# Patient Record
Sex: Female | Born: 1965 | Hispanic: Refuse to answer | Marital: Single | Smoking: Former smoker
Health system: Southern US, Community
[De-identification: ages and names within clinical notes are randomized; demographics above are authoritative.]

## PROBLEM LIST (undated history)

## (undated) DIAGNOSIS — I639 Cerebral infarction, unspecified: Secondary | ICD-10-CM

## (undated) DIAGNOSIS — I10 Essential (primary) hypertension: Secondary | ICD-10-CM

## (undated) DIAGNOSIS — D649 Anemia, unspecified: Secondary | ICD-10-CM

## (undated) HISTORY — PX: ABDOMINAL HYSTERECTOMY: SHX81

## (undated) HISTORY — PX: CHOLECYSTECTOMY: SHX55

## (undated) HISTORY — DX: Essential (primary) hypertension: I10

## (undated) SURGERY — Surgical Case
Anesthesia: *Unknown

---

## 2005-03-26 ENCOUNTER — Inpatient Hospital Stay: Payer: Self-pay | Admitting: Internal Medicine

## 2005-03-26 ENCOUNTER — Other Ambulatory Visit: Payer: Self-pay

## 2005-08-07 ENCOUNTER — Encounter: Payer: Self-pay | Admitting: Podiatry

## 2005-09-05 ENCOUNTER — Encounter: Payer: Self-pay | Admitting: Podiatry

## 2008-03-06 ENCOUNTER — Emergency Department: Payer: Self-pay | Admitting: Emergency Medicine

## 2008-11-11 ENCOUNTER — Ambulatory Visit: Payer: Self-pay | Admitting: Family Medicine

## 2009-03-24 ENCOUNTER — Ambulatory Visit: Payer: Self-pay | Admitting: Unknown Physician Specialty

## 2009-03-31 ENCOUNTER — Inpatient Hospital Stay: Payer: Self-pay | Admitting: Specialist

## 2009-08-01 ENCOUNTER — Ambulatory Visit: Payer: Self-pay | Admitting: Family Medicine

## 2010-08-21 ENCOUNTER — Inpatient Hospital Stay: Payer: Self-pay | Admitting: Internal Medicine

## 2010-09-01 ENCOUNTER — Ambulatory Visit: Payer: Self-pay | Admitting: Cardiology

## 2010-09-30 ENCOUNTER — Inpatient Hospital Stay: Payer: Self-pay | Admitting: *Deleted

## 2011-06-26 ENCOUNTER — Emergency Department: Payer: Self-pay | Admitting: Emergency Medicine

## 2011-09-05 ENCOUNTER — Ambulatory Visit: Payer: Self-pay | Admitting: Family Medicine

## 2011-09-18 ENCOUNTER — Ambulatory Visit: Payer: Self-pay | Admitting: Family Medicine

## 2011-10-02 ENCOUNTER — Ambulatory Visit: Payer: Self-pay | Admitting: General Surgery

## 2012-02-15 ENCOUNTER — Emergency Department: Payer: Self-pay | Admitting: Internal Medicine

## 2012-02-25 ENCOUNTER — Ambulatory Visit (INDEPENDENT_AMBULATORY_CARE_PROVIDER_SITE_OTHER): Payer: Managed Care, Other (non HMO) | Admitting: Family Medicine

## 2012-02-25 ENCOUNTER — Encounter: Payer: Self-pay | Admitting: Family Medicine

## 2012-02-25 VITALS — BP 138/100 | HR 68 | Temp 97.9°F | Ht 65.25 in | Wt 252.0 lb

## 2012-02-25 DIAGNOSIS — N898 Other specified noninflammatory disorders of vagina: Secondary | ICD-10-CM

## 2012-02-25 DIAGNOSIS — N899 Noninflammatory disorder of vagina, unspecified: Secondary | ICD-10-CM

## 2012-02-25 DIAGNOSIS — I10 Essential (primary) hypertension: Secondary | ICD-10-CM

## 2012-02-25 DIAGNOSIS — E669 Obesity, unspecified: Secondary | ICD-10-CM

## 2012-02-25 LAB — POCT URINALYSIS DIPSTICK
Bilirubin, UA: NEGATIVE
Glucose, UA: NEGATIVE
Nitrite, UA: NEGATIVE
Spec Grav, UA: 1.01
Urobilinogen, UA: NEGATIVE

## 2012-02-25 MED ORDER — FLUCONAZOLE 150 MG PO TABS: 150.0000 mg | ORAL_TABLET | Freq: Once | ORAL | Status: AC

## 2012-02-25 NOTE — Progress Notes (Signed)
Subjective:    Patient ID: [name], female    DOB: 03-05-1966, 46 y.o.   MRN: [id number]  HPI  1.  HTN- on Benicar HCTZ- tried numerous other BP meds in past, did not control her blood pressure. Has not taken her medication this morning. Car broke down, rushed to get here. No CP, SOB, blurred vision or HA.  2.  Vaginal irritation- past couple of weeks- vulvular itching/irritation.  No discharge or dysuria. Not sexually active.  No fevers or chills.  3.  FH of CAD- mom died of MI in her 8s.   Per pt, lipid panel was normal.  4.  Obesity- has lost 80 pounds with diet and exercise.  Has tried everything- low/no carb, weight watchers, low sugar.  Wants to see a surgeon about bariatric surgery.  Patient Active Problem List  Diagnoses  . HTN (hypertension)  . Obesity  . Vaginal irritation   No past medical history on file. Past Surgical History  Procedure Date  . Abdominal hysterectomy   . Cholecystectomy    History  Substance Use Topics  . Smoking status: Current Everyday Smoker  . Smokeless tobacco: Not on file  . Alcohol Use: Not on file   No family history on file. Allergies  Allergen Reactions  . Penicillins Swelling   Current Outpatient Prescriptions on File Prior to Visit  Medication Sig Dispense Refill  . olmesartan-hydrochlorothiazide (BENICAR HCT) 40-25 MG per tablet Take 1 tablet by mouth daily.       The PMH, PSH, Social History, Family History, Medications, and allergies have been reviewed in [facility], and have been updated if relevant.   Review of Systems     Objective:   Physical Exam BP 138/100  Pulse 68  Temp(Src) 97.9 F (36.6 C) (Oral)  Ht 5' 5.25" (1.657 m)  Wt 252 lb (114.306 kg)  BMI 41.61 kg/m2  General:  Well-developed,well-nourished,in no acute distress; alert,appropriate and cooperative throughout examination Head:  normocephalic and atraumatic.   Eyes:  vision grossly intact, pupils equal, pupils round, and pupils reactive to  light.   Ears:  R ear normal and L ear normal.   Nose:  no external deformity.   Mouth:  good dentition.   Neck:  No deformities, masses, or tenderness noted.  Lungs:  Normal respiratory effort, chest expands symmetrically. Lungs are clear to auscultation, no crackles or wheezes. Heart:  Normal rate and regular rhythm. S1 and S2 normal without gallop, murmur, click, rub or other extra sounds. Abdomen:  Bowel sounds positive,abdomen soft and non-tender without masses, organomegaly or hernias noted. Rectal:  no external abnormalities.   Genitalia:  Pelvic Exam:        External: normal female genitalia without lesions or masses        Scant white discharge in vault Msk:  No deformity or scoliosis noted of thoracic or lumbar spine.   Extremities:  No clubbing, cyanosis, edema, or deformity noted with normal full range of motion of all joints.   Neurologic:  alert & oriented X3 and gait normal.   Skin:  Intact without suspicious lesions or rashes Axillary Nodes:  No palpable lymphadenopathy Psych:  Cognition and judgment appear intact. Alert and cooperative with normal attention span and concentration. No apparent delusions, illusions, hallucinations     Assessment & Plan:   1. HTN (hypertension)  Deteriorated but did not take her medication today.  Per pt, has been well controlled.  Will continue current dose of Benicar-HCTZ.   2. Obesity  Deteriorated.  BMI >40, will refer to surgery. Ambulatory referral to General Surgery  3. Vaginal irritation  New- wet prep pos for yeast. Treat with diflucan 150 mg po x 1. See pt instructions for details. POCT urinalysis dipstick, Urine culture

## 2012-02-25 NOTE — Patient Instructions (Signed)
    Monilial Vaginitis Vaginitis in a soreness, swelling and redness (inflammation) of the vagina and vulva. Monilial vaginitis is not a sexually transmitted infection. CAUSES  Yeast vaginitis is caused by yeast (candida) that is normally found in your vagina. With a yeast infection, the candida has overgrown in number to a point that upsets the chemical balance. SYMPTOMS   White, thick vaginal discharge.   Swelling, itching, redness and irritation of the vagina and possibly the lips of the vagina (vulva).   Burning or painful urination.   Painful intercourse.  DIAGNOSIS  Things that may contribute to monilial vaginitis are:  Postmenopausal and virginal states.   Pregnancy.   Infections.   Being tired, sick or stressed, especially if you had monilial vaginitis in the past.   Diabetes. Good control will help lower the chance.   Birth control pills.   Tight fitting garments.   Using bubble bath, feminine sprays, douches or deodorant tampons.   Taking certain medications that kill germs (antibiotics).   Sporadic recurrence can occur if you become ill.  TREATMENT  Your caregiver will give you medication.  There are several kinds of anti monilial vaginal creams and suppositories specific for monilial vaginitis. For recurrent yeast infections, use a suppository or cream in the vagina 2 times a week, or as directed.   Anti-monilial or steroid cream for the itching or irritation of the vulva may also be used. Get your caregiver's permission.   Painting the vagina with methylene blue solution may help if the monilial cream does not work.   Eating yogurt may help prevent monilial vaginitis.  HOME CARE INSTRUCTIONS   Finish all medication as prescribed.   Do not have sex until treatment is completed or after your caregiver tells you it is okay.   Take warm sitz baths.   Do not douche.   Do not use tampons, especially scented ones.   Wear cotton underwear.   Avoid  tight pants and panty hose.   Tell your sexual partner that you have a yeast infection. They should go to their caregiver if they have symptoms such as mild rash or itching.   Your sexual partner should be treated as well if your infection is difficult to eliminate.   Practice safer sex. Use condoms.   Some vaginal medications cause latex condoms to fail. Vaginal medications that harm condoms are:   Cleocin cream.   Butoconazole (Femstat).   Terconazole (Terazol) vaginal suppository.   Miconazole (Monistat) (may be purchased over the counter).  SEEK MEDICAL CARE IF:   You have a temperature by mouth above 102 F (38.9 C).   The infection is getting worse after 2 days of treatment.   The infection is not getting better after 3 days of treatment.   You develop blisters in or around your vagina.   You develop vaginal bleeding, and it is not your menstrual period.   You have pain when you urinate.   You develop intestinal problems.   You have pain with sexual intercourse.  Document Released: 08/01/2005 Document Revised: 10/11/2011 Document Reviewed: 04/15/2009 [facility] Patient Information 2012 [place], [place].   It was so nice to meet you. Please stop by to see Shirlee Limerick on your way out to set up your referral for surgery- call your insurance company.

## 2012-02-27 ENCOUNTER — Other Ambulatory Visit: Payer: Self-pay | Admitting: *Deleted

## 2012-02-27 LAB — URINE CULTURE: Colony Count: 35000

## 2012-02-27 MED ORDER — FLUCONAZOLE 150 MG PO TABS: 150.0000 mg | ORAL_TABLET | Freq: Once | ORAL | Status: AC

## 2012-02-27 NOTE — Telephone Encounter (Signed)
Medicine sent to [facility] per Dr. Dayton Martes.

## 2012-04-25 ENCOUNTER — Ambulatory Visit: Payer: Self-pay | Admitting: General Surgery

## 2012-04-28 ENCOUNTER — Ambulatory Visit: Payer: Managed Care, Other (non HMO) | Admitting: Family Medicine

## 2012-05-01 ENCOUNTER — Ambulatory Visit (INDEPENDENT_AMBULATORY_CARE_PROVIDER_SITE_OTHER): Payer: Managed Care, Other (non HMO) | Admitting: Family Medicine

## 2012-05-01 ENCOUNTER — Encounter: Payer: Self-pay | Admitting: Family Medicine

## 2012-05-01 VITALS — BP 132/94 | HR 72 | Temp 98.5°F | Wt 262.0 lb

## 2012-05-01 DIAGNOSIS — E669 Obesity, unspecified: Secondary | ICD-10-CM

## 2012-05-01 DIAGNOSIS — R1030 Lower abdominal pain, unspecified: Secondary | ICD-10-CM

## 2012-05-01 DIAGNOSIS — R109 Unspecified abdominal pain: Secondary | ICD-10-CM

## 2012-05-01 DIAGNOSIS — J309 Allergic rhinitis, unspecified: Secondary | ICD-10-CM

## 2012-05-01 LAB — POCT URINALYSIS DIPSTICK
Bilirubin, UA: NEGATIVE
Glucose, UA: NEGATIVE
Leukocytes, UA: NEGATIVE
Nitrite, UA: NEGATIVE

## 2012-05-01 NOTE — Patient Instructions (Addendum)
It was great to see you. The medication we discussed today is called Belvig- call your insurance company to see if they would cover it.  Please stop by to see Shirlee Limerick on your way out to set up your ultrasound.

## 2012-05-01 NOTE — Progress Notes (Signed)
Subjective:    Patient ID: [name], female    DOB: Apr 30, 1966, 46 y.o.   MRN: [id number]  HPI  Pleasant 45 yo female here for:  1.  Lower abdominal pain on and off for 3 weeks. She notices it more when she is walking.  No fevers. No dysuria, no hematuria. No changes in her bowel habits, no blood in her stool. S/p hysterectomy 3 years ago. Pain feels like "cramping when she had a period." Not sexually active. No vaginal discharge.  2.  Sinuses- uses flonase but still gets what she thinks are sinus headaches- pain under her eyes bilaterally, improved on flonase. No fevers or chills. No CP, cough or SOB.   3.  Obesity- has lost 80 pounds with diet and exercise.  Has tried everything- low/no carb, weight watchers, low sugar.   Went to see the bariatric surgeon and not sure that she wants to pursue surgery. Asking about diet pills.  Patient Active Problem List  Diagnosis  . HTN (hypertension)  . Obesity  . Vaginal irritation  . Lower abdominal pain   No past medical history on file. Past Surgical History  Procedure Date  . Abdominal hysterectomy   . Cholecystectomy    History  Substance Use Topics  . Smoking status: Current Everyday Smoker  . Smokeless tobacco: Not on file  . Alcohol Use: Not on file   Family History  Problem Relation Age of Onset  . Heart disease Mother 30    MI   Allergies  Allergen Reactions  . Penicillins Swelling   Current Outpatient Prescriptions on File Prior to Visit  Medication Sig Dispense Refill  . olmesartan-hydrochlorothiazide (BENICAR HCT) 40-25 MG per tablet Take 1 tablet by mouth daily.       The PMH, PSH, Social History, Family History, Medications, and allergies have been reviewed in [facility], and have been updated if relevant.   Review of Systems    See HPI Objective:   Physical Exam BP 132/94  Pulse 72  Temp 98.5 F (36.9 C)  Wt 262 lb (118.842 kg)  General:  Well-developed,well-nourished,in no acute distress;  alert,appropriate and cooperative throughout examination Head:  normocephalic and atraumatic.   Eyes:  vision grossly intact, pupils equal, pupils round, and pupils reactive to light.   Ears:  R ear normal and L ear normal.   Nose:  +erythema, otherwise unremarkable. Mouth:  good dentition.   Neck:  No deformities, masses, or tenderness noted.  Lungs:  Normal respiratory effort, chest expands symmetrically. Lungs are clear to auscultation, no crackles or wheezes. Heart:  Normal rate and regular rhythm. S1 and S2 normal without gallop, murmur, click, rub or other extra sounds. Abdomen:   Mild TTP over lower abdomen/suprapubic area (left >right), bilaterally, no rebound or guarding. Msk:  No deformity or scoliosis noted of thoracic or lumbar spine.   Extremities:  No clubbing, cyanosis, edema, or deformity noted with normal full range of motion of all joints.   Neurologic:  alert & oriented X3 and gait normal.   Skin:  Intact without suspicious lesions or rashes Psych:  Cognition and judgment appear intact. Alert and cooperative with normal attention span and concentration. No apparent delusions, illusions, hallucinations     Assessment & Plan:   1. Lower abdominal pain  New- unclear etiology. No evidence of acute abdomen on exam. UA neg except for trace blood- send for cx. ?ovarian cyst  No changes in bowel habits- diverticulitis seems less likely. Will get transvaginal/pelvic ultrasound  US Transvaginal Non-OB, US Pelvis Complete, POCT urinalysis dipstick, Urine culture  2. Obesity  Deteriorated. Discussed that she is not a candidate for stimulant appetite suppressants such as phentermine due to her HTN. I am willing to consider Belvig. She will call her insurance company to determine cost.   3. Allergic rhinitis No evidence of acute sinusitis. Continue flonase, add claritin or allegra. The patient indicates understanding of these issues and agrees with the plan.

## 2012-05-03 LAB — URINE CULTURE: Organism ID, Bacteria: NO GROWTH

## 2012-07-18 ENCOUNTER — Ambulatory Visit: Payer: Managed Care, Other (non HMO) | Admitting: Family Medicine

## 2012-07-25 ENCOUNTER — Ambulatory Visit: Payer: Managed Care, Other (non HMO) | Admitting: Family Medicine

## 2012-07-25 DIAGNOSIS — Z0289 Encounter for other administrative examinations: Secondary | ICD-10-CM

## 2012-08-20 ENCOUNTER — Encounter: Payer: Self-pay | Admitting: Family Medicine

## 2012-08-20 ENCOUNTER — Telehealth: Payer: Self-pay | Admitting: Internal Medicine

## 2012-08-20 ENCOUNTER — Ambulatory Visit (INDEPENDENT_AMBULATORY_CARE_PROVIDER_SITE_OTHER): Payer: Self-pay | Admitting: Family Medicine

## 2012-08-20 VITALS — BP 148/96 | HR 82 | Temp 98.5°F | Wt 263.2 lb

## 2012-08-20 DIAGNOSIS — J019 Acute sinusitis, unspecified: Secondary | ICD-10-CM

## 2012-08-20 MED ORDER — DOXYCYCLINE HYCLATE 100 MG PO TABS: 100.0000 mg | ORAL_TABLET | Freq: Two times a day (BID) | ORAL | Status: DC

## 2012-08-20 NOTE — Assessment & Plan Note (Signed)
Nontoxic, nasal saline and doxy.  F/u prn.  Supportive measures o/w.  She agrees.  Likely component of ETD (can't clear ET with valsalva) that should resolve.

## 2012-08-20 NOTE — Progress Notes (Signed)
Sx started >1 week ago.  Itchy eyes, B ear pain.  No help with OTC meds but had some palpitations after the cold medicine.  Muscles aching.  No fevers but felt hot.  Some sweats.  Cough is minimal, no sputum.  Some bloody rhinorrhea.  Congested.  She isn't improving.  Mult sick exposures at work.    Meds, vitals, and allergies reviewed.   ROS: See HPI.  Otherwise, noncontributory.  GEN: nad, alert and oriented HEENT: mucous membranes moist, tm w/o erythema, nasal exam with purulent rhinorrhea noted,  OP with cobblestoning, max and frontal sinuses ttp NECK: supple w/o LA CV: rrr.   PULM: ctab, no inc wob EXT: no edema SKIN: no acute rash

## 2012-08-20 NOTE — Telephone Encounter (Signed)
Caller: Laurna/Patient; Patient Name: [name]; PCP: Ruthe Mannan [facility]); Best Callback Phone Number: [phone]  onset about 08-13-12 of head congestion  08-20-12 she said she has a lot of facial pain and headache  No fever.  She said she gets sinus infections and this feels same.  unsure if fever but says she does get hot and cold  All emergent sxs per Upper Respiratory Infection Ruled out except for mild to moderate headache for more than 24 hours unrelieved with non prescription medications  Appt made for today at 1415 with Dr Para March

## 2012-08-20 NOTE — Patient Instructions (Signed)
Take plain claritin (not claritin D) for the itchy eyes.  Use nasal saline a few times a day.  Start taking doxycycline twice a day.  Careful for sunburn on that medicine.  Drink plenty of fluids, take tylenol as needed, and this should gradually improve.  Take care.  Let us know if you have other concerns.

## 2012-09-29 ENCOUNTER — Encounter: Payer: Self-pay | Admitting: Family Medicine

## 2012-09-29 ENCOUNTER — Ambulatory Visit (INDEPENDENT_AMBULATORY_CARE_PROVIDER_SITE_OTHER): Payer: Managed Care, Other (non HMO) | Admitting: Family Medicine

## 2012-09-29 VITALS — BP 160/110 | HR 88 | Temp 98.1°F | Wt 267.0 lb

## 2012-09-29 DIAGNOSIS — E669 Obesity, unspecified: Secondary | ICD-10-CM

## 2012-09-29 DIAGNOSIS — I10 Essential (primary) hypertension: Secondary | ICD-10-CM

## 2012-09-29 MED ORDER — AMLODIPINE-OLMESARTAN 10-40 MG PO TABS: 1.0000 | ORAL_TABLET | Freq: Every day | ORAL | Status: DC

## 2012-09-29 MED ORDER — LORCASERIN HCL 10 MG PO TABS: 1.0000 | ORAL_TABLET | Freq: Two times a day (BID) | ORAL | Status: DC

## 2012-09-29 MED ORDER — HYDROCHLOROTHIAZIDE 25 MG PO TABS: 25.0000 mg | ORAL_TABLET | Freq: Every day | ORAL | Status: DC

## 2012-09-29 NOTE — Patient Instructions (Addendum)
Have a Happy Thanksgiving.  We will call you with your lab results.  STOP taking your BENICAR.  Start taking the HCTZ and olmesartan-amlodipine.  Check your blood pressure at home and call me.  Please come see me in one month.

## 2012-09-29 NOTE — Progress Notes (Signed)
Subjective:    Patient ID: [name], female    DOB: Apr 16, 1966, 46 y.o.   MRN: [id number]  HPI  Very pleasant 46 yo female here for feeling her her BP is high for past month or more. Not checking her BP at home.  Has been oon Benicar HCTZ for years- tried numerous other BP meds in past, did not control her blood pressure. Has taken her medication today.  She has gained 30 pounds in one year.  BP Readings from Last 3 Encounters:  09/29/12 160/110  08/20/12 148/96  05/01/12 132/94    No CP.  She has had some DOE but thinks that is related to deconditioning due to her weight gain.  She continues to inquire about diet pills as she feels she really needs helping getting back into her weight loss routine.  Strong   FH of CAD- mom died of MI in her 66s.    Per pt, last lipid panel normal    Patient Active Problem List  Diagnosis  . HTN (hypertension)  . Obesity  . Vaginal irritation  . Lower abdominal pain  . Allergic rhinitis  . Sinusitis, acute   No past medical history on file. Past Surgical History  Procedure Date  . Abdominal hysterectomy   . Cholecystectomy    History  Substance Use Topics  . Smoking status: Current Every Day Smoker -- 0.3 packs/day    Types: Cigarettes  . Smokeless tobacco: Not on file  . Alcohol Use: Yes     Comment: Very seldom   Family History  Problem Relation Age of Onset  . Heart disease Mother 55    MI   Allergies  Allergen Reactions  . Penicillins Swelling   Current Outpatient Prescriptions on File Prior to Visit  Medication Sig Dispense Refill  . olmesartan-hydrochlorothiazide (BENICAR HCT) 40-25 MG per tablet Take 1 tablet by mouth daily.       The PMH, PSH, Social History, Family History, Medications, and allergies have been reviewed in [facility], and have been updated if relevant.   Review of Systems See HPI    Objective:   Physical Exam BP 160/110  Pulse 88  Temp 98.1 F (36.7 C)  Wt 267 lb (121.11 kg)  General:   Well-developed,well-nourished,in no acute distress; alert,appropriate and cooperative throughout examination Head:  normocephalic and atraumatic.   Eyes:  vision grossly intact, pupils equal, pupils round, and pupils reactive to light.   Ears:  R ear normal and L ear normal.   Nose:  no external deformity.   Mouth:  good dentition.   Neck:  No deformities, masses, or tenderness noted.  Lungs:  Normal respiratory effort, chest expands symmetrically. Lungs are clear to auscultation, no crackles or wheezes. Heart:  Normal rate and regular rhythm. S1 and S2 normal without gallop, murmur, click, rub or other extra sounds. Extremities:  No clubbing, cyanosis, edema, or deformity noted with normal full range of motion of all joints.   Neurologic:  alert & oriented X3 and gait normal.   Skin:  Intact without suspicious lesions or rashes Axillary Nodes:  No palpable lymphadenopathy Psych:  Cognition and judgment appear intact. Alert and cooperative with normal attention span and concentration. No apparent delusions, illusions, hallucinations     Assessment & Plan:    1. HTN (hypertension)  Deteriorated, likely due to weight gain.  EKG reassuring- sinus rhythm with ?LVH- unchanged from 2011.  Will d/c benicar. Continue HCTZ (as single drug pill) and start olmsartan- amlodipine.  She will check BP at home and call me.  Follow up in one month.  Check labs today as well for risk stratification and to rule out other possible contributing factors to her increased BP. EKG 12-Lead, Comprehensive metabolic panel, Lipid Panel, TSH  2. Obesity  Deteriorated.  Due to HTN, she is not a candidate for phentermine or other stimulants.  She would like to try Bevliq- discussed risks and benefits.  Rx given and she will follow up in one month.

## 2012-09-30 ENCOUNTER — Other Ambulatory Visit: Payer: Self-pay

## 2012-09-30 ENCOUNTER — Telehealth: Payer: Self-pay

## 2012-09-30 LAB — COMPREHENSIVE METABOLIC PANEL
ALT: 20 U/L (ref 0–35)
Alkaline Phosphatase: 56 U/L (ref 39–117)
CO2: 29 mEq/L (ref 19–32)
Sodium: 135 mEq/L (ref 135–145)
Total Bilirubin: 0.2 mg/dL — ABNORMAL LOW (ref 0.3–1.2)
Total Protein: 7.8 g/dL (ref 6.0–8.3)

## 2012-09-30 LAB — LIPID PANEL
LDL Cholesterol: 76 mg/dL (ref 0–99)
Total CHOL/HDL Ratio: 3
VLDL: 26 mg/dL (ref 0.0–40.0)

## 2012-09-30 LAB — TSH: TSH: 0.16 u[IU]/mL — ABNORMAL LOW (ref 0.35–5.50)

## 2012-09-30 NOTE — Telephone Encounter (Signed)
Pt saw Dr Dayton Martes 09/29/12 and was started on 2 new meds; [facility] has not notified pt rx are ready. [facility] [address] [facility] not [facility] yet; pt will call pharmacy to verify  Azor and HCTZ are ready for pick up. If any questions or problem pt will have [facility] contact our office.

## 2012-09-30 NOTE — Telephone Encounter (Signed)
Advised pharmacist, she says pt is allergic to lotrel.  Called patient and left a message asking her to call me back.

## 2012-09-30 NOTE — Telephone Encounter (Signed)
Noted  

## 2012-09-30 NOTE — Telephone Encounter (Signed)
Pt not allergic to ARBs.  She has been on olmesartan for years without reaction, we just added amlodipine. Rx is appropriate.

## 2012-09-30 NOTE — Telephone Encounter (Signed)
Spoke with patient.  She says she cant take lotrel because of a reaction but she has been taking olmesartan for years.  Advised pharmacist at [facility] who said she would reactivate the script and fill for patient.

## 2012-09-30 NOTE — Telephone Encounter (Signed)
Crystal from [facility] cancelled rx for [employer] said pt is allergic to ace inhibitors; request different med sent to pharmacy to replace Azor.Please advise.

## 2012-10-01 ENCOUNTER — Telehealth: Payer: Self-pay | Admitting: Family Medicine

## 2012-10-01 ENCOUNTER — Telehealth: Payer: Self-pay | Admitting: *Deleted

## 2012-10-01 ENCOUNTER — Other Ambulatory Visit: Payer: Self-pay | Admitting: Family Medicine

## 2012-10-01 DIAGNOSIS — E0590 Thyrotoxicosis, unspecified without thyrotoxic crisis or storm: Secondary | ICD-10-CM

## 2012-10-01 NOTE — Telephone Encounter (Signed)
Called patient, left another message asking her to call me back.

## 2012-10-01 NOTE — Telephone Encounter (Signed)
Samples of azor 10-40 mg's given per Dr. Dayton Martes.  8 boxes of # 7 each. Lot number [id number], exp 09/2012.

## 2012-10-01 NOTE — Telephone Encounter (Signed)
Ad[facility] that [facility] is saying that pt does not have active policy with them, therefore we cant get prior auth for azor.  Pt states she will call them on Monday because she does have an active policy.  She will call us back with update next week.

## 2012-10-01 NOTE — Telephone Encounter (Signed)
Caller: Jonna/Patient; Phone: [phone]; Reason for Call: Returning a call to [facility], Dr.  Elmer Sow nurse.

## 2012-10-15 ENCOUNTER — Ambulatory Visit: Payer: Managed Care, Other (non HMO) | Admitting: Internal Medicine

## 2012-10-17 ENCOUNTER — Ambulatory Visit: Payer: Managed Care, Other (non HMO) | Admitting: Internal Medicine

## 2012-11-06 ENCOUNTER — Ambulatory Visit: Payer: Managed Care, Other (non HMO) | Admitting: Internal Medicine

## 2012-12-09 ENCOUNTER — Ambulatory Visit: Payer: Managed Care, Other (non HMO) | Admitting: Family Medicine

## 2012-12-09 ENCOUNTER — Ambulatory Visit: Payer: Self-pay | Admitting: Family Medicine

## 2012-12-09 ENCOUNTER — Encounter: Payer: Self-pay | Admitting: Family Medicine

## 2012-12-09 ENCOUNTER — Ambulatory Visit (INDEPENDENT_AMBULATORY_CARE_PROVIDER_SITE_OTHER): Payer: Self-pay | Admitting: Family Medicine

## 2012-12-09 VITALS — BP 160/110 | HR 89 | Temp 97.9°F | Wt 280.0 lb

## 2012-12-09 DIAGNOSIS — J069 Acute upper respiratory infection, unspecified: Secondary | ICD-10-CM

## 2012-12-09 MED ORDER — AMOXICILLIN 875 MG PO TABS: 875.0000 mg | ORAL_TABLET | Freq: Two times a day (BID) | ORAL | Status: DC

## 2012-12-09 MED ORDER — AMLODIPINE-OLMESARTAN 10-40 MG PO TABS: 1.0000 | ORAL_TABLET | Freq: Every day | ORAL | Status: DC

## 2012-12-09 MED ORDER — HYDROCODONE-HOMATROPINE 5-1.5 MG/5ML PO SYRP: 5.0000 mL | ORAL_SOLUTION | Freq: Three times a day (TID) | ORAL | Status: DC | PRN

## 2012-12-09 MED ORDER — AZITHROMYCIN 250 MG PO TABS: ORAL_TABLET | ORAL | Status: DC

## 2012-12-09 NOTE — Patient Instructions (Addendum)
Good to see you. Please take zpack as directed.   Drink lots of fluids.  Treat sympotmatically with Mucinex, nasal saline irrigation, and Tylenol/Ibuprofen. You can use warm compresses.  Cough suppressant at night. Call if not improving as expected in 5-7 days.

## 2012-12-09 NOTE — Progress Notes (Addendum)
SUBJECTIVE:  [name] is a 47 y.o. female who complains of coryza, congestion, sneezing, sore throat, productive cough, bilateral sinus pain and fever for 20 days. She denies a history of anorexia and chest pain and denies a history of asthma. Patient admits to smoke cigarettes.   Patient Active Problem List  Diagnosis  . HTN (hypertension)  . Obesity  . Vaginal irritation  . Lower abdominal pain  . Allergic rhinitis  . Sinusitis, acute   No past medical history on file. Past Surgical History  Procedure Date  . Abdominal hysterectomy   . Cholecystectomy    History  Substance Use Topics  . Smoking status: Current Every Day Smoker -- 0.3 packs/day    Types: Cigarettes  . Smokeless tobacco: Not on file  . Alcohol Use: Yes     Comment: Very seldom   Family History  Problem Relation Age of Onset  . Heart disease Mother 60    MI   Allergies  Allergen Reactions  . Penicillins Swelling   Current Outpatient Prescriptions on File Prior to Visit  Medication Sig Dispense Refill  . amLODipine-olmesartan (AZOR) 10-40 MG per tablet Take 1 tablet by mouth daily.  30 tablet  1  . hydrochlorothiazide (HYDRODIURIL) 25 MG tablet Take 1 tablet (25 mg total) by mouth daily.  90 tablet  3  . Lorcaserin HCl (BELVIQ) 10 MG TABS Take 1 tablet by mouth 2 (two) times daily.  60 tablet  0   The PMH, PSH, Social History, Family History, Medications, and allergies have been reviewed in [facility], and have been updated if relevant.  OBJECTIVE: BP 160/110  Pulse 89  Temp 97.9 F (36.6 C)  Wt 280 lb (127.007 kg)  SpO2 98%  She appears well, vital signs are as noted. Ears normal.  Throat and pharynx normal.  Neck supple. No adenopathy in the neck. Nose is congested. Sinuses non tender.  Scattered exp wheezes  ASSESSMENT:  bronchitis  PLAN: Given duration and progression of symptoms in a smoker, will treat for bacterial bronchitis.  Symptomatic therapy suggested: push fluids, rest and  return office visit prn if symptoms persist or worsen.  Call or return to clinic prn if these symptoms worsen or fail to improve as anticipated.   Addendum:  Pt states that "zpacks don't work" and request amoxicillin.  States she can take this despite PCN allergy. Rx changed.

## 2012-12-09 NOTE — Addendum Note (Signed)
Addended by: Dianne Dun on: 12/09/2012 09:46 AM   Modules accepted: Orders

## 2013-02-10 ENCOUNTER — Telehealth: Payer: Self-pay | Admitting: Family Medicine

## 2013-02-10 ENCOUNTER — Ambulatory Visit (INDEPENDENT_AMBULATORY_CARE_PROVIDER_SITE_OTHER): Payer: BC Managed Care – PPO | Admitting: Family Medicine

## 2013-02-10 ENCOUNTER — Encounter: Payer: Self-pay | Admitting: Family Medicine

## 2013-02-10 VITALS — BP 162/112 | HR 92 | Temp 98.6°F | Ht 65.75 in | Wt 279.5 lb

## 2013-02-10 DIAGNOSIS — R0989 Other specified symptoms and signs involving the circulatory and respiratory systems: Secondary | ICD-10-CM

## 2013-02-10 DIAGNOSIS — I10 Essential (primary) hypertension: Secondary | ICD-10-CM

## 2013-02-10 DIAGNOSIS — R0609 Other forms of dyspnea: Secondary | ICD-10-CM

## 2013-02-10 DIAGNOSIS — R0789 Other chest pain: Secondary | ICD-10-CM

## 2013-02-10 DIAGNOSIS — R0600 Dyspnea, unspecified: Secondary | ICD-10-CM

## 2013-02-10 MED ORDER — HYDROCHLOROTHIAZIDE 25 MG PO TABS: 25.0000 mg | ORAL_TABLET | Freq: Every day | ORAL | Status: DC

## 2013-02-10 NOTE — Patient Instructions (Addendum)
EKG today - overall stable. blood work today - I will check heart enzyme.  If abnormal, I will call you at [phone] to go to ER for further evaluation. Start hydrochlorothiazide 25mg  daily along with your olmesartan-amlodipine daily for better blood pressure control. Start tylenol 500mg  2 pills twice daily for chest discomfort as may be msuculoskeletal. If any worsening, please go to ER.

## 2013-02-10 NOTE — Assessment & Plan Note (Addendum)
Associated with left arm soreness, dyspnea on mild exertion and generalized nonpitting edema. Check blood work including troponin I today, Cr, CBC as well as urine microalbumin. Given chest tightness somewhat reproducible with palpation, ?MSK component to discomfort. Cardiac risk factors include obesity, uncontrolled hypertension, and family history of premature CAD I did suggest referral to cardiologist for further risk stratification.  Likely will not tolerate treadmill stress test 2/2 dyspnea.  EKG - NSR rate 90s, normal axis, no acute ST/T changes.  borderline LVH.

## 2013-02-10 NOTE — Telephone Encounter (Signed)
Pt has appt with Dr. Reece Agar this afternoon.

## 2013-02-10 NOTE — Assessment & Plan Note (Signed)
uncontrolled. Has not been taking HCTZ.  Recommended restart this med.

## 2013-02-10 NOTE — Telephone Encounter (Signed)
I am not here this afternoon.  Please make sure pt either has appt to be seen here or advised to go to UC.

## 2013-02-10 NOTE — Telephone Encounter (Signed)
Patient Information:  Caller Name: Janesia  Phone: [phone]  Patient: [name], [name]  Gender: Female  DOB: April 16, 1966  Age: 47 Years  PCP: Ruthe Mannan [facility])  Pregnant: No  Office Follow Up:  Does the office need to follow up with this patient?: Yes  Instructions For The Office: appt unavailable for See in Office Now disposition; krs/can  RN Note:  Onset of exertional shortness of breath 02/10/13.  States swelling of face as well.  Per edema protocol, advised office now; no appts available within designated time frame.  Info to office for staff/provider review/management of appt need.  krs/can  Symptoms  Reason For Call & Symptoms: edema in extremities and new onset wheezing; edemia has been puffy for several weeks, but states her shoes no longer fit; states sudden increase over past 24 hours.  Reviewed Health History In EMR: Yes  Reviewed Medications In EMR: Yes  Reviewed Allergies In EMR: Yes  Reviewed Surgeries / Procedures: Yes  Date of Onset of Symptoms: Unknown OB / GYN:  LMP: Unknown  Guideline(s) Used:  Leg Swelling and Edema  Disposition Per Guideline:   Go to Office Now  Reason For Disposition Reached:   Swelling of face, arm or hands (Exception: slight puffiness of fingers during hot weather)  Advice Given:  N/A  Patient Will Follow Care Advice:  YES

## 2013-02-10 NOTE — Progress Notes (Signed)
  Subjective:    Patient ID: [name], female    DOB: 02-22-1966, 47 y.o.   MRN: [id number]  HPI CC: swelling  Several week history of progressively increasing dyspnea with exertion.  Ambulation less than 1 block causes this.  Also with 5 d h/o noticing increased swelling throughout body as well as left arm soreness "like i got in a fight".  Denies chest pain.  Endorses some generalized chest tightness present for the last month.  Chest tightness not exertional, not relieved by rest.  Denies nausea/diaphoresis. Also with pain in back of head.  Denies vision changes. Smoker <1/3 ppd. fmhx CAD - mother with MI age 48. Denies h/o HLD. Lab Results  Component Value Date   CHOL 145 09/29/2012   HDL 42.90 09/29/2012   LDLCALC 76 09/29/2012   TRIG 130.0 09/29/2012   CHOLHDL 3 09/29/2012  No h/o asthma, COPD.  Not on hormonal meds.  No recent prolonged immobility.  No personal or family history of blood clots.  Has only been taking amlodipine omesartan 10/40 mg daily.  Has not been taking HCTZ 25mg  (although as far as I can tell this was prescribed 09/2012). BP consistently elevated over last several visits.  BP Readings from Last 3 Encounters:  02/10/13 162/112  12/09/12 160/110  09/29/12 160/110    Patient Active Problem List  Diagnosis  . HTN (hypertension)  . Obesity  . Vaginal irritation  . Lower abdominal pain  . Allergic rhinitis  . Sinusitis, acute   Past Medical History  Diagnosis Date  . Hypertension     Family History  Problem Relation Age of Onset  . Heart disease Mother 73    MI   Review of Systems Per HPI    Objective:   Physical Exam  Nursing note and vitals reviewed. Constitutional: She appears well-developed and well-nourished. No distress.  obese  HENT:  Head: Normocephalic and atraumatic.  Mouth/Throat: Oropharynx is clear and moist. No oropharyngeal exudate.  Eyes: Conjunctivae and EOM are normal. Pupils are equal, round, and reactive to  light.  Neck: Normal range of motion. Neck supple.  Cardiovascular: Normal rate, regular rhythm, normal heart sounds and intact distal pulses.   No murmur heard. Pulmonary/Chest: Effort normal and breath sounds normal. No respiratory distress. She has no wheezes. She has no rales. She exhibits tenderness (tender at L>R 2nd costochondral junctions).  Musculoskeletal: She exhibits edema (nonpitting edema bilateral UE, LE).  Lymphadenopathy:    She has no cervical adenopathy.  Skin: Skin is warm and dry. No rash noted.  Psychiatric: She has a normal mood and affect.       Assessment & Plan:

## 2013-02-11 ENCOUNTER — Ambulatory Visit: Payer: BC Managed Care – PPO | Admitting: Family Medicine

## 2013-02-11 ENCOUNTER — Telehealth: Payer: Self-pay | Admitting: Family Medicine

## 2013-02-11 LAB — CBC WITH DIFFERENTIAL/PLATELET
Basophils Relative: 0.6 % (ref 0.0–3.0)
Eosinophils Absolute: 0.1 10*3/uL (ref 0.0–0.7)
MCHC: 33.3 g/dL (ref 30.0–36.0)
MCV: 88.2 fl (ref 78.0–100.0)
Monocytes Absolute: 0.7 10*3/uL (ref 0.1–1.0)
Neutro Abs: 6.8 10*3/uL (ref 1.4–7.7)
Neutrophils Relative %: 72.2 % (ref 43.0–77.0)
RBC: 4.52 Mil/uL (ref 3.87–5.11)
RDW: 14.6 % (ref 11.5–14.6)

## 2013-02-11 LAB — BASIC METABOLIC PANEL
Calcium: 8.8 mg/dL (ref 8.4–10.5)
Creatinine, Ser: 0.9 mg/dL (ref 0.4–1.2)

## 2013-02-11 LAB — MICROALBUMIN / CREATININE URINE RATIO: Microalb, Ur: 7.3 mg/dL — ABNORMAL HIGH (ref 0.0–1.9)

## 2013-02-11 LAB — TSH: TSH: 0.66 u[IU]/mL (ref 0.35–5.50)

## 2013-02-11 NOTE — Telephone Encounter (Signed)
Patients Cardiology Appt is next Friday, 02/20/13 with Dr Mariah Milling , she is wanting to know if it is ok to wait until then b/c of the swelling? That was the soonest appt we could get.

## 2013-02-11 NOTE — Telephone Encounter (Signed)
Yes should be ok - I do want her to keep track of bp over next several days and call us with #s early next week. I think HCTZ med should help with swelling, may take a few days to notice improvement. If worsening, to notify us sooner. I'd also like her to start taking enteric coated aspirin 81mg  daily.

## 2013-02-12 ENCOUNTER — Inpatient Hospital Stay: Payer: Self-pay | Admitting: Internal Medicine

## 2013-02-12 LAB — CBC
MCHC: 33.7 g/dL (ref 32.0–36.0)
Platelet: 220 10*3/uL (ref 150–440)
WBC: 8.8 10*3/uL (ref 3.6–11.0)

## 2013-02-12 LAB — TROPONIN I: Troponin-I: 0.07 ng/mL — ABNORMAL HIGH

## 2013-02-12 LAB — COMPREHENSIVE METABOLIC PANEL
Albumin: 4 g/dL (ref 3.4–5.0)
BUN: 14 mg/dL (ref 7–18)
Bilirubin,Total: 0.2 mg/dL (ref 0.2–1.0)
Co2: 27 mmol/L (ref 21–32)
Creatinine: 0.82 mg/dL (ref 0.60–1.30)
Osmolality: 276 (ref 275–301)
Sodium: 138 mmol/L (ref 136–145)

## 2013-02-12 NOTE — Telephone Encounter (Signed)
Pt. Notified and verbalized understanding

## 2013-02-13 DIAGNOSIS — R079 Chest pain, unspecified: Secondary | ICD-10-CM

## 2013-02-13 DIAGNOSIS — R7989 Other specified abnormal findings of blood chemistry: Secondary | ICD-10-CM

## 2013-02-13 DIAGNOSIS — I059 Rheumatic mitral valve disease, unspecified: Secondary | ICD-10-CM

## 2013-02-13 DIAGNOSIS — I739 Peripheral vascular disease, unspecified: Secondary | ICD-10-CM

## 2013-02-13 LAB — URINALYSIS, COMPLETE
Bilirubin,UR: NEGATIVE
Blood: NEGATIVE
Glucose,UR: NEGATIVE mg/dL (ref 0–75)
Leukocyte Esterase: NEGATIVE
Protein: NEGATIVE
RBC,UR: 1 /HPF (ref 0–5)
Specific Gravity: 1.017 (ref 1.003–1.030)

## 2013-02-13 LAB — CBC WITH DIFFERENTIAL/PLATELET
Basophil %: 0.8 %
Eosinophil #: 0.1 10*3/uL (ref 0.0–0.7)
Eosinophil %: 1.7 %
HCT: 36.4 % (ref 35.0–47.0)
Lymphocyte #: 2.2 10*3/uL (ref 1.0–3.6)
MCH: 30 pg (ref 26.0–34.0)
MCHC: 33.8 g/dL (ref 32.0–36.0)
MCV: 89 fL (ref 80–100)
Neutrophil #: 4.5 10*3/uL (ref 1.4–6.5)
Neutrophil %: 58.1 %
Platelet: 200 10*3/uL (ref 150–440)
RDW: 14.4 % (ref 11.5–14.5)
WBC: 7.7 10*3/uL (ref 3.6–11.0)

## 2013-02-13 LAB — LIPID PANEL
HDL Cholesterol: 39 mg/dL — ABNORMAL LOW (ref 40–60)
Ldl Cholesterol, Calc: 62 mg/dL (ref 0–100)
VLDL Cholesterol, Calc: 18 mg/dL (ref 5–40)

## 2013-02-13 LAB — CK TOTAL AND CKMB (NOT AT ARMC)
CK, Total: 178 U/L (ref 21–215)
CK, Total: 184 U/L (ref 21–215)
CK, Total: 160 U/L (ref 21–215)
CK-MB: 0.8 ng/mL (ref 0.5–3.6)

## 2013-02-13 LAB — TROPONIN I
Troponin-I: 0.07 ng/mL — ABNORMAL HIGH
Troponin-I: 0.08 ng/mL — ABNORMAL HIGH

## 2013-02-13 LAB — BASIC METABOLIC PANEL
Anion Gap: 3 — ABNORMAL LOW (ref 7–16)
Calcium, Total: 7.9 mg/dL — ABNORMAL LOW (ref 8.5–10.1)
Co2: 28 mmol/L (ref 21–32)
EGFR (African American): >60
EGFR (Non-African Amer.): >60
Glucose: 90 mg/dL (ref 65–99)
Osmolality: 273 (ref 275–301)

## 2013-02-13 LAB — TSH: Thyroid Stimulating Horm: 0.88 u[IU]/mL

## 2013-02-18 ENCOUNTER — Telehealth: Payer: Self-pay | Admitting: Family Medicine

## 2013-02-18 NOTE — Telephone Encounter (Signed)
Pt called and spoke with Lowella Petties, CMA.  She stated she was upset because she called last week after her OV with Dr. Sharen Hones regarding swelling.  She said that she didn't hear back in time from our office in time and ended up being admitted to [facility].  She is requesting a call back from Dr. Dayton Martes or Dr. Sharen Hones.

## 2013-02-18 NOTE — Telephone Encounter (Signed)
Spoke with patient, appt scheduled for 4/25.

## 2013-02-18 NOTE — Telephone Encounter (Signed)
D/C summary records reviewed. I called and spoke with patient.  She was upset about how long it took to get back with her on 02/12/2013 regarding my recommendations. I did not receive message she was in hospital with possible MI. I apologized for lack of communication during her hospitalization and not getting back to her sooner. She was appreciative of call.  Jacki Cones, can we schedule appt with Dr. Dayton Martes in next 1-2 weeks for hospital follow up?  Will route to PCP to keep her informed. Have added amlodipine onto intolerance list as may have caused swelling.

## 2013-02-20 ENCOUNTER — Ambulatory Visit: Payer: BC Managed Care – PPO | Admitting: Cardiovascular Disease

## 2013-02-27 ENCOUNTER — Ambulatory Visit (INDEPENDENT_AMBULATORY_CARE_PROVIDER_SITE_OTHER): Payer: BC Managed Care – PPO | Admitting: Family Medicine

## 2013-02-27 DIAGNOSIS — I10 Essential (primary) hypertension: Secondary | ICD-10-CM

## 2013-02-27 DIAGNOSIS — R0789 Other chest pain: Secondary | ICD-10-CM

## 2013-02-27 DIAGNOSIS — F17200 Nicotine dependence, unspecified, uncomplicated: Secondary | ICD-10-CM

## 2013-02-27 NOTE — Progress Notes (Signed)
No show

## 2013-03-04 ENCOUNTER — Ambulatory Visit (INDEPENDENT_AMBULATORY_CARE_PROVIDER_SITE_OTHER): Payer: BC Managed Care – PPO | Admitting: Family Medicine

## 2013-03-04 ENCOUNTER — Encounter: Payer: Self-pay | Admitting: Family Medicine

## 2013-03-04 VITALS — BP 132/82 | HR 72 | Temp 98.2°F | Wt 276.0 lb

## 2013-03-04 DIAGNOSIS — E669 Obesity, unspecified: Secondary | ICD-10-CM

## 2013-03-04 DIAGNOSIS — R0789 Other chest pain: Secondary | ICD-10-CM

## 2013-03-04 DIAGNOSIS — R51 Headache: Secondary | ICD-10-CM

## 2013-03-04 DIAGNOSIS — F411 Generalized anxiety disorder: Secondary | ICD-10-CM

## 2013-03-04 DIAGNOSIS — R002 Palpitations: Secondary | ICD-10-CM

## 2013-03-04 DIAGNOSIS — R609 Edema, unspecified: Secondary | ICD-10-CM

## 2013-03-04 MED ORDER — LORCASERIN HCL 10 MG PO TABS: 1.0000 | ORAL_TABLET | Freq: Two times a day (BID) | ORAL | Status: DC

## 2013-03-04 NOTE — Patient Instructions (Addendum)
Good to see you. We are starting Belviq- tablet twice daily.  Please stop by to see Shirlee Limerick on your way out to set up your referral.  Please come see me again in 1 month, sooner if your symptoms return.

## 2013-03-04 NOTE — Progress Notes (Signed)
HPI  47 yo with h/o HTN, tobacco abuse and family h/o premature CAD,  here for acute visit for palpitations and HA.  No showed for her hospital follow up appt last week.  Notes reviewed- admitted to [facility] 4/10- 02/13/2013 for CP.  Troponins were elevated in the ER.    Cardiology consulted, Dr. Kirke Corin, who felt symptoms could be consistent with unstable angina. Cardiac cath performed which showed normal cardiac arteries.  Left LE doppler neg for DVT. 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  She had admitted to not taking her HCTZ (she had only been taking Norvasc).  D/c'd home with coreg 12.5 mg daily and losartan 50 mg daily.  BP at d/c was 132/81 but elevated today.  Felt edema was due to norvasc.  Edema has improved but still has a little.  Since d/c, she has been much more anxious.  Comes in today feeling very anxious and HA after having a "confrontation at work." Feels better now that she has had a few minutes to relax.  Obesity- feels her weight is the main issue.  Has tried "every diet," including weight watchers, Karie Mainland, low carb/low sugar.  Patient Active Problem List   Diagnosis Date Noted  . Palpitations 03/04/2013  . Tobacco abuse 02/27/2013  . Chest discomfort 02/10/2013  . Lower abdominal pain 05/01/2012  . Allergic rhinitis 05/01/2012  . HTN (hypertension) 02/25/2012  . Obesity 02/25/2012   Past Medical History  Diagnosis Date  . Hypertension    Past Surgical History  Procedure Laterality Date  . Abdominal hysterectomy    . Cholecystectomy     History  Substance Use Topics  . Smoking status: Current Every Day Smoker -- 0.30 packs/day    Types: Cigarettes  . Smokeless tobacco: Never Used  . Alcohol Use: Yes     Comment: Very seldom   Family History  Problem Relation Age of Onset  . Heart disease Mother 48    MI   Allergies  Allergen Reactions  . Amlodipine Swelling  . Penicillins  Swelling   Current Outpatient Prescriptions on File Prior to Visit  Medication Sig Dispense Refill  . aspirin EC 81 MG tablet Take 81 mg by mouth daily.      . carvedilol (COREG) 12.5 MG tablet Take 12.5 mg by mouth 2 (two) times daily with a meal.      . Lorcaserin HCl (BELVIQ) 10 MG TABS Take 1 tablet by mouth 2 (two) times daily.  60 tablet  0  . losartan (COZAAR) 50 MG tablet Take 50 mg by mouth daily.       No current facility-administered medications on file prior to visit.   The PMH, PSH, Social History, Family History, Medications, and allergies have been reviewed in [facility], and have been updated if relevant.    Review of Systems Per HPI   No CP No SOB Palpitations have resolved Denies SI or HI   Physical Exam  BP 132/82  Pulse 72  Temp(Src) 98.2 F (36.8 C)  Wt 276 lb (125.193 kg)  BMI 44.89 kg/m2  Wt Readings from Last 3 Encounters:  03/04/13 276 lb (125.193 kg)  02/10/13 279 lb 8 oz (126.78 kg)  12/09/12 280 lb (127.007 kg)    Constitutional: She appears well-developed and well-nourished. No distress.  obese  HENT:  Head: Normocephalic and atraumatic.  Mouth/Throat: Oropharynx is clear and moist. No oropharyngeal exudate.  Eyes: Conjunctivae and  EOM are normal. Pupils are equal, round, and reactive to light.  Neck: Normal range of motion. Neck supple.  Cardiovascular: Normal rate, regular rhythm, normal heart sounds and intact distal pulses.   No murmur heard. Pulmonary/Chest: Effort normal and breath sounds normal. No respiratory distress. She has no wheezes. She has no rales. She exhibits tenderness (tender at L>R 2nd costochondral junctions).  Musculoskeletal: She exhibits edema (trace nonpitting edema bilateral UE, LE).  Lymphadenopathy:    She has no cervical adenopathy.  Skin: Skin is warm and dry. No rash noted.  Psychiatric: She has a normal mood and affect.   Assessment and Plan:  1. Chest discomfort Resolved and cardiac w/u reassuring.   Continue coreg.  2. Palpitations Resolved, likely due to stress from verbal confrontation at work. Call or return to clinic prn if these symptoms worsen or fail to improve as anticipated.   3. Headache Resolved.  4. Edema Improving off norvasc.  5. Anxiety state, unspecified Discussed tx options.  She does not want psychotherapy or anxiolytic at this time.  Feels this is related to her weight.  6. Obesity She is walking several days per week. Asks for diet pill- unable to use phentermine due to HTN. Rx given for Belviq and will refer to bariatric sugeron. The patient indicates understanding of these issues and agrees with the plan.

## 2013-03-05 ENCOUNTER — Ambulatory Visit: Payer: BC Managed Care – PPO | Admitting: Family Medicine

## 2013-03-24 ENCOUNTER — Ambulatory Visit: Payer: BC Managed Care – PPO | Admitting: Cardiovascular Disease

## 2013-03-27 ENCOUNTER — Ambulatory Visit: Payer: BC Managed Care – PPO | Admitting: Family Medicine

## 2013-04-13 ENCOUNTER — Ambulatory Visit: Payer: BC Managed Care – PPO | Admitting: Family Medicine

## 2013-04-13 DIAGNOSIS — Z0289 Encounter for other administrative examinations: Secondary | ICD-10-CM

## 2013-04-21 ENCOUNTER — Other Ambulatory Visit: Payer: Self-pay

## 2013-04-21 NOTE — Telephone Encounter (Signed)
Pt left v/m requesting  BP med refill to [facility] [address] Left /vm for pt to cb with names of BP med requested and pt had no show on 04/13/13,03/07/13 and cancellation on 03/05/13. Pt is out of med.

## 2013-04-22 MED ORDER — CARVEDILOL 12.5 MG PO TABS: 12.5000 mg | ORAL_TABLET | Freq: Two times a day (BID) | ORAL | Status: DC

## 2013-04-22 NOTE — Telephone Encounter (Signed)
Pt request refill carvedilol to [facility] [address]; pt stated she has not had any no show visits.Please advise.

## 2013-06-02 ENCOUNTER — Ambulatory Visit (INDEPENDENT_AMBULATORY_CARE_PROVIDER_SITE_OTHER)
Admission: RE | Admit: 2013-06-02 | Discharge: 2013-06-02 | Disposition: A | Discharge status: Home / Self Care | Payer: Medicaid Other | Source: Ambulatory Visit | Attending: Family Medicine | Admitting: Family Medicine

## 2013-06-02 ENCOUNTER — Encounter: Payer: Self-pay | Admitting: Family Medicine

## 2013-06-02 ENCOUNTER — Ambulatory Visit (INDEPENDENT_AMBULATORY_CARE_PROVIDER_SITE_OTHER): Payer: Medicaid Other | Admitting: Family Medicine

## 2013-06-02 VITALS — BP 190/120 | HR 84 | Temp 98.1°F | Ht 65.75 in | Wt 270.0 lb

## 2013-06-02 DIAGNOSIS — M25512 Pain in left shoulder: Secondary | ICD-10-CM

## 2013-06-02 DIAGNOSIS — M25519 Pain in unspecified shoulder: Secondary | ICD-10-CM

## 2013-06-02 DIAGNOSIS — R51 Headache: Secondary | ICD-10-CM

## 2013-06-02 DIAGNOSIS — I10 Essential (primary) hypertension: Secondary | ICD-10-CM

## 2013-06-02 MED ORDER — AMOXICILLIN 875 MG PO TABS: 875.0000 mg | ORAL_TABLET | Freq: Two times a day (BID) | ORAL | Status: DC

## 2013-06-02 MED ORDER — AZELASTINE HCL 0.1 % NA SOLN: 1.0000 | Freq: Two times a day (BID) | NASAL | Status: DC

## 2013-06-02 MED ORDER — LOSARTAN POTASSIUM 100 MG PO TABS: 100.0000 mg | ORAL_TABLET | Freq: Every day | ORAL | Status: DC

## 2013-06-02 NOTE — Progress Notes (Signed)
Subjective:    Patient ID: [name], female    DOB: 10/09/1966, 47 y.o.   MRN: [id number]  HPI  Very pleasant 46 yo female here for HA/sinus pressure.  Allergic rhinitis worse this year.  Flonase not helping.  Past couple of weeks, daily HA, mainly in her sinuses.  BP has been up too.  No CP or SOB.  Left arm has been hurting since 02/2013.  No known injury.  No tingling in finger tips.  Patient Active Problem List   Diagnosis Date Noted  . Headache(784.0) 06/02/2013  . Palpitations 03/04/2013  . Edema 03/04/2013  . Anxiety state, unspecified 03/04/2013  . Tobacco abuse 02/27/2013  . Chest discomfort 02/10/2013  . Lower abdominal pain 05/01/2012  . Allergic rhinitis 05/01/2012  . HTN (hypertension) 02/25/2012  . Obesity 02/25/2012   Past Medical History  Diagnosis Date  . Hypertension    Past Surgical History  Procedure Laterality Date  . Abdominal hysterectomy    . Cholecystectomy     History  Substance Use Topics  . Smoking status: Current Every Day Smoker -- 0.30 packs/day    Types: Cigarettes  . Smokeless tobacco: Never Used  . Alcohol Use: Yes     Comment: Very seldom   Family History  Problem Relation Age of Onset  . Heart disease Mother 32    MI   Allergies  Allergen Reactions  . Amlodipine Swelling  . Penicillins Swelling   Current Outpatient Prescriptions on File Prior to Visit  Medication Sig Dispense Refill  . aspirin EC 81 MG tablet Take 81 mg by mouth daily.      . carvedilol (COREG) 12.5 MG tablet Take 1 tablet (12.5 mg total) by mouth 2 (two) times daily with a meal.  60 tablet  0   No current facility-administered medications on file prior to visit.   The PMH, PSH, Social History, Family History, Medications, and allergies have been reviewed in [facility], and have been updated if relevant.   Review of Systems    See HPI No blurred vision No dysphagia No facial droop Objective:   Physical Exam  Constitutional: She is oriented  to person, place, and time. She appears well-developed and well-nourished. No distress.  HENT:  Head: Normocephalic.  Right Ear: Hearing normal. A middle ear effusion is present.  Left Ear: Hearing normal. A middle ear effusion is present.  Nose: Mucosal edema, rhinorrhea and sinus tenderness present. Right sinus exhibits maxillary sinus tenderness. Left sinus exhibits maxillary sinus tenderness.  Cardiovascular: Normal rate and regular rhythm.   Pulmonary/Chest: Effort normal and breath sounds normal.  Musculoskeletal:       Right shoulder: Normal.       Arms: Neurological: She is alert and oriented to person, place, and time. She has normal strength. No cranial nerve deficit or sensory deficit.  Skin: Skin is warm, dry and intact.  Psychiatric: She has a normal mood and affect. Her speech is normal and behavior is normal. Judgment and thought content normal. Cognition and memory are normal.   BP 190/120  Pulse 84  Temp(Src) 98.1 F (36.7 C)  Ht 5' 5.75" (1.67 m)  Wt 270 lb (122.471 kg)  BMI 43.91 kg/m2        Assessment & Plan:  1. HTN (hypertension) Deteriorated.  Per pt, has been elevated but she feels it is even higher than usual due to persistent HA.  Does have findings consistent with acute sinusitis. Increase losartan to 100 mg daily, continue current  dose of Coreg.  She has BP cuff at home.  She will call me in 2 weeks with readings.  2. Left shoulder pain Consistent with impingement/rotator cuff syndrome.   Xray today given duration of symptoms. - DG Shoulder Left; Future  3. Headache(784.0) Sinusitis in addition to elevated BP. D/c flonase.  Start Astelin. Amoxicillin as directed (PCN allergic but can tolerate amoxicillin).

## 2013-06-02 NOTE — Patient Instructions (Addendum)
Good to see you. We will call you with your xray results.  Take Amoxicillin as directed- 1 tablet twice daily for 10 days.  Increase your losartan 100 mg daily (you can take two of your 50 mg tablets until you run out).  Starting astelin.  Call me next week with your blood pressure readings.

## 2013-06-08 ENCOUNTER — Ambulatory Visit: Payer: Medicaid Other | Admitting: Family Medicine

## 2013-06-08 ENCOUNTER — Telehealth: Payer: Self-pay | Admitting: *Deleted

## 2013-06-08 MED ORDER — OLMESARTAN MEDOXOMIL-HCTZ 40-25 MG PO TABS: 1.0000 | ORAL_TABLET | Freq: Every day | ORAL | Status: DC

## 2013-06-08 NOTE — Telephone Encounter (Signed)
Pt came in of OV but was denied because of Medicaid [place] Access and bill balance, pt wanted BP checked, BP was 188/120, pt states she's having headaches, pt taking losartan and states it's not helping, she was on benicar and felt it did better and also would like to know if she should be on a fluid pill? Her hands and feet are swollen? Please advise, uses [facility] [address]

## 2013-06-08 NOTE — Telephone Encounter (Signed)
Left message on cell phone VM, advised pt to call if any problems and to establish with PCP that will accept medicaid

## 2013-06-08 NOTE — Telephone Encounter (Signed)
Let's d/c her losartan and restart benicar.  I do not see that it was stopped due to an intolerance.  Rx sent. Is she going to establish with another practice that accepts her insurance because she needs close monitoring of her BP.

## 2013-07-23 ENCOUNTER — Ambulatory Visit: Payer: Medicaid Other | Admitting: Family Medicine

## 2013-08-11 LAB — COMPREHENSIVE METABOLIC PANEL WITH GFR
Albumin: 3.9 g/dL
Alkaline Phosphatase: 104 U/L
Anion Gap: 8
BUN: 15 mg/dL
Bilirubin,Total: 0.3 mg/dL
Calcium, Total: 8.7 mg/dL
Chloride: 102 mmol/L
Co2: 27 mmol/L
Creatinine: 0.99 mg/dL
EGFR (African American): >60
EGFR (Non-African Amer.): >60
Glucose: 92 mg/dL
Osmolality: 274
Potassium: 3.4 mmol/L — ABNORMAL LOW
SGOT(AST): 24 U/L
SGPT (ALT): 31 U/L
Sodium: 137 mmol/L
Total Protein: 8 g/dL

## 2013-08-11 LAB — URINALYSIS, COMPLETE
Bacteria: NONE SEEN
Bilirubin,UR: NEGATIVE
Ketone: NEGATIVE
Protein: NEGATIVE
RBC,UR: <1 /HPF (ref 0–5)
Squamous Epithelial: 1

## 2013-08-11 LAB — CBC
HGB: 13.8 g/dL (ref 12.0–16.0)
MCH: 29.3 pg (ref 26.0–34.0)
MCHC: 33.2 g/dL (ref 32.0–36.0)
MCV: 88 fL (ref 80–100)
WBC: 8.9 10*3/uL (ref 3.6–11.0)

## 2013-08-11 LAB — TROPONIN I: Troponin-I: 0.08 ng/mL — ABNORMAL HIGH

## 2013-08-11 LAB — APTT: Activated PTT: 32.4 s

## 2013-08-11 LAB — PROTIME-INR
INR: 1.1
Prothrombin Time: 14.8 secs — ABNORMAL HIGH (ref 11.5–14.7)

## 2013-08-12 ENCOUNTER — Inpatient Hospital Stay: Payer: Self-pay | Admitting: Internal Medicine

## 2013-08-13 ENCOUNTER — Telehealth: Payer: Self-pay | Admitting: Family Medicine

## 2013-08-13 NOTE — Telephone Encounter (Signed)
Pt was just released last night form [facility] with Aneurysm. They are wanting her to be seen by her primary care physician. You don't have anything until next Thursday ??

## 2013-08-13 NOTE — Telephone Encounter (Signed)
Yes I know.  I am so overbooked due to my pregnancy. Could one of my partners graciously see her?  If not, please let me know.

## 2013-08-14 NOTE — Telephone Encounter (Signed)
Pt does not want to see anyone other than Dr. Dayton Martes.  Appt made for 10/13 with Dr. Dayton Martes per Dr. Dayton Martes.

## 2013-08-17 ENCOUNTER — Ambulatory Visit (INDEPENDENT_AMBULATORY_CARE_PROVIDER_SITE_OTHER): Payer: BC Managed Care – PPO | Admitting: Family Medicine

## 2013-08-17 ENCOUNTER — Encounter: Payer: Self-pay | Admitting: Family Medicine

## 2013-08-17 VITALS — BP 146/100 | HR 78 | Temp 97.9°F | Ht 65.75 in | Wt 268.8 lb

## 2013-08-17 DIAGNOSIS — Z720 Tobacco use: Secondary | ICD-10-CM

## 2013-08-17 DIAGNOSIS — I671 Cerebral aneurysm, nonruptured: Secondary | ICD-10-CM

## 2013-08-17 DIAGNOSIS — M531 Cervicobrachial syndrome: Secondary | ICD-10-CM

## 2013-08-17 DIAGNOSIS — R51 Headache: Secondary | ICD-10-CM

## 2013-08-17 DIAGNOSIS — F411 Generalized anxiety disorder: Secondary | ICD-10-CM

## 2013-08-17 DIAGNOSIS — F17200 Nicotine dependence, unspecified, uncomplicated: Secondary | ICD-10-CM

## 2013-08-17 DIAGNOSIS — M5481 Occipital neuralgia: Secondary | ICD-10-CM

## 2013-08-17 DIAGNOSIS — I10 Essential (primary) hypertension: Secondary | ICD-10-CM

## 2013-08-17 MED ORDER — DOXYCYCLINE HYCLATE 100 MG PO TABS: 100.0000 mg | ORAL_TABLET | Freq: Two times a day (BID) | ORAL | Status: DC

## 2013-08-17 MED ORDER — FLUTICASONE PROPIONATE 50 MCG/ACT NA SUSP: 2.0000 | Freq: Every day | NASAL | Status: DC

## 2013-08-17 NOTE — Progress Notes (Signed)
Subjective:    Patient ID: [name], female    DOB: [birth year], 47 y.o.   MRN: [id number]  HPI  47 yo very pleasant female with h/o HTN, HLD, tobacco abuse, here for hospital follow up.  Notes from [facility] and [facility] reviewed.  Went to [facility] on 10/7 for several months of sharp occipital "head pain" that would last for moments but take her breath away. BP was also quite elevated in ER.  LP unsuccessful.  CT of head neg. 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  Saw neurologist and neurosurgeon.  Neurosurgeon, Dr. Ayesha Mohair- Pernell Dupre, discussed tx options with her.  Since aneurysm greater than 5 mm, she did recommend intervention with endovascular coil embolization.  She is scheduled to angiogram on Wednesday for better pre operative guidance.    Neurologist felt her headaches were unrelated to her aneurysm and more likely occipital neuralgia since they are reproducible with palpation.  BP has been much better controlled.  Elevated today but has not yet taken her medications today.  She knows she needs to quit smoking.  Has cut back to 1-2 cigarettes per day.  Nicoderm prescribed in ER but she has not yet picked up this prescription.  [name] is understandably anxious about this procedure and has a lot of questions for me today.  She also has had runny nose, cough, sinus congestion for over 8 days.  Told neurologist and he recommended she discuss this with me today.  Denies fevers, SOB, chest pain or cough. PCN allergic.  Patient Active Problem List   Diagnosis Date Noted  . Brain aneurysm 08/17/2013  . Occipital neuralgia 08/17/2013  . Sinus headache 08/17/2013  . Headache(784.0) 06/02/2013  . Palpitations 03/04/2013  . Edema 03/04/2013  . Anxiety state, unspecified 03/04/2013  . Tobacco abuse 02/27/2013  . Chest discomfort 02/10/2013  . Lower abdominal pain 05/01/2012  . Allergic rhinitis 05/01/2012  . HTN (hypertension)  02/25/2012  . Obesity 02/25/2012   Past Medical History  Diagnosis Date  . Hypertension    Past Surgical History  Procedure Laterality Date  . Abdominal hysterectomy    . Cholecystectomy     History  Substance Use Topics  . Smoking status: Current Every Day Smoker -- 0.30 packs/day    Types: Cigarettes  . Smokeless tobacco: Never Used     Comment: trying to quit  . Alcohol Use: Yes     Comment: Very seldom   Family History  Problem Relation Age of Onset  . Heart disease Mother 41    MI   Allergies  Allergen Reactions  . Amlodipine Swelling  . Penicillins Swelling   Current Outpatient Prescriptions on File Prior to Visit  Medication Sig Dispense Refill  . aspirin EC 81 MG tablet Take 81 mg by mouth daily.      Marland Kitchen olmesartan-hydrochlorothiazide (BENICAR HCT) 40-25 MG per tablet Take 1 tablet by mouth daily.  30 tablet  1  . azelastine (ASTELIN) 137 MCG/SPRAY nasal spray Place 1 spray into the nose 2 (two) times daily. Use in each nostril as directed  30 mL  12  . carvedilol (COREG) 12.5 MG tablet Take 1 tablet (12.5 mg total) by mouth 2 (two) times daily with a meal.  60 tablet  0   No current facility-administered medications on file prior to visit.   The PMH, PSH, Social History, Family History, Medications, and allergies have been reviewed in [facility], and have been updated if relevant.  Review of Systems See HPI +HA No blurred vision No UE or LE weakness +left shoulder pain- chronic rotator cuff issues    Objective:   Physical Exam  Constitutional: She is oriented to person, place, and time. She appears well-developed and well-nourished. No distress.  HENT:  Head: Normocephalic.  Right Ear: Hearing and tympanic membrane normal.  Left Ear: Hearing and tympanic membrane normal.  Nose: Mucosal edema, rhinorrhea and sinus tenderness present. Right sinus exhibits frontal sinus tenderness. Left sinus exhibits frontal sinus tenderness.  Eyes: Pupils are equal, round,  and reactive to light.  Pulmonary/Chest: Effort normal and breath sounds normal.  Neurological: She is alert and oriented to person, place, and time. She has normal strength. No cranial nerve deficit or sensory deficit.  Psychiatric: She has a normal mood and affect. Her speech is normal.   BP 146/100  Pulse 78  Temp(Src) 97.9 F (36.6 C) (Oral)  Ht 5' 5.75" (1.67 m)  Wt 268 lb 12 oz (121.904 kg)  BMI 43.71 kg/m2        Assessment & Plan:  1. Tobacco abuse She is ready to quit.  She has been making progress. She is aware that smoking is putting her at greater risk for complications from her aneurysm.  2. Brain aneurysm She is proceeding with angiogram and possible coiling. Discussed importance of smoking cessation and keeping BP under control. - Ambulatory referral to Neurology  3. HTN (hypertension) Discussed breathing techniques for relaxation as she does seem to become hypertensive with anxiety. The patient indicates understanding of these issues and agrees with the plan.  4. Anxiety state, unspecified See above.  5. Occipital neuralgia She understandably wants to treat her aneurism first and then would like referral to local neurologist.  This seems very reasonable. - Ambulatory referral to Neurology  6. Sinus headache Given duration and progression of symptoms, will treat for bacterial sinusitis with doxycyline, flonase.

## 2013-08-17 NOTE — Patient Instructions (Addendum)
Good to see you. Take doxycyline as directed- 1 tablet twice daily x 10 days. Use flonase as directed.  We will call you with your neurology referral.

## 2013-08-18 ENCOUNTER — Ambulatory Visit: Payer: Medicaid Other | Admitting: Family Medicine

## 2013-08-20 ENCOUNTER — Encounter: Payer: Self-pay | Admitting: Family Medicine

## 2013-09-07 ENCOUNTER — Ambulatory Visit (INDEPENDENT_AMBULATORY_CARE_PROVIDER_SITE_OTHER): Payer: BC Managed Care – PPO | Admitting: Family Medicine

## 2013-09-07 ENCOUNTER — Encounter: Payer: Self-pay | Admitting: Family Medicine

## 2013-09-07 VITALS — BP 164/120 | HR 92 | Temp 98.1°F | Wt 272.8 lb

## 2013-09-07 DIAGNOSIS — R51 Headache: Secondary | ICD-10-CM

## 2013-09-07 DIAGNOSIS — I10 Essential (primary) hypertension: Secondary | ICD-10-CM

## 2013-09-07 MED ORDER — LEVOFLOXACIN 500 MG PO TABS: 500.0000 mg | ORAL_TABLET | Freq: Every day | ORAL | Status: DC

## 2013-09-07 NOTE — Progress Notes (Deleted)
  Subjective:    Patient ID: [name], female    DOB: 07/27/1966, 47 y.o.   MRN: [id number]  HPI CC: "sinus problems"  Ear pain and headache.    Review of Systems     Objective:   Physical Exam        Assessment & Plan:

## 2013-09-07 NOTE — Patient Instructions (Addendum)
For your sinus pressure headaches -  Take medicine as prescribed: levofloxacin antibiotic once daily for 7 days. Take immediate release guaifenesin with plenty of water to help mobilize mucous Push fluids and plenty of rest. I'm worried about your blood pressure - return in 1-2 weeks for a recheck (prior to s

## 2013-09-07 NOTE — Assessment & Plan Note (Signed)
Discussed my concern with her markedly elevated bp.  Pt is not concerned, states she "normally runs up to 190 without problem". Pt adamant about not wanting to change antihypertensives. Agrees to return in 1-2 wks for BP recheck.

## 2013-09-07 NOTE — Assessment & Plan Note (Addendum)
Persistent sinus pressure headache after not taking doxy as prescribed. Will increase med to levofloxacin 500mg  x7d course. Advised use immediate release guaifenesin with plenty of water to help mobilize mucous and push fluids and rest. Update if sxs persist.

## 2013-09-07 NOTE — Progress Notes (Signed)
  Subjective:    Patient ID: [name], female    DOB: 08/02/1966, 47 y.o.   MRN: [id number]  HPI CC: sinusitis?  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  Has seen [facility] surgeons - planning on procedure in December to take care of this.  On carbamazepine for h/o pinched nerve. HTN - on benicar hct.  States blood pressure runs high, declines further meds to help with this despite discussed safety concerns of high bp in setting of aneurysm.  Coreg caused constipation so she stopped.  Having sinus issues for the last month.  Having ear pain, neck discomfort, facial sinus pressure.  Blows nose but nothing comes up.  Treated by PCP with doxycycline last month, didn't help sxs.  Actually only took once daily x 20 days instead of bid dosing as prescribed.  No fevers/chills.  No tooth pain currently.  No chest pain, dyspnea.  No wheezing.  No cough. Hasn't taken anything for this.  Currently taking flonase for h/o allergies.  No sick contacts at home. Pt smokes - 2 cig/ day.     Past Medical History  Diagnosis Date  . Hypertension     Review of Systems Per HPI    Objective:   Physical Exam  Nursing note and vitals reviewed. Constitutional: She appears well-developed and well-nourished. No distress.  HENT:  Head: Normocephalic and atraumatic.  Right Ear: Hearing, tympanic membrane, external ear and ear canal normal.  Left Ear: Hearing, tympanic membrane, external ear and ear canal normal.  Nose: No mucosal edema or rhinorrhea. Right sinus exhibits maxillary sinus tenderness and frontal sinus tenderness. Left sinus exhibits maxillary sinus tenderness and frontal sinus tenderness.  Mouth/Throat: Uvula is midline, oropharynx is clear and moist and mucous membranes are normal. No oropharyngeal exudate, posterior oropharyngeal edema, posterior oropharyngeal erythema or tonsillar abscesses.  Eyes: Conjunctivae and EOM are normal. Pupils are equal, round, and reactive to light. No scleral  icterus.  Neck: Normal range of motion. Neck supple.  Cardiovascular: Normal rate, regular rhythm, normal heart sounds and intact distal pulses.   No murmur heard. Pulmonary/Chest: Effort normal and breath sounds normal. No respiratory distress. She has no wheezes. She has no rales.  Lymphadenopathy:    She has no cervical adenopathy.  Skin: Skin is warm and dry. No rash noted.       Assessment & Plan:

## 2013-09-11 ENCOUNTER — Ambulatory Visit: Payer: BC Managed Care – PPO | Admitting: Neurology

## 2013-09-18 ENCOUNTER — Ambulatory Visit (INDEPENDENT_AMBULATORY_CARE_PROVIDER_SITE_OTHER): Payer: BC Managed Care – PPO | Admitting: Family Medicine

## 2013-09-18 ENCOUNTER — Encounter: Payer: Self-pay | Admitting: Family Medicine

## 2013-09-18 VITALS — BP 160/110 | HR 68 | Temp 98.5°F | Wt 273.0 lb

## 2013-09-18 DIAGNOSIS — I671 Cerebral aneurysm, nonruptured: Secondary | ICD-10-CM

## 2013-09-18 DIAGNOSIS — I10 Essential (primary) hypertension: Secondary | ICD-10-CM

## 2013-09-18 MED ORDER — CLONIDINE HCL 0.1 MG PO TABS: 0.1000 mg | ORAL_TABLET | Freq: Two times a day (BID) | ORAL | Status: DC | PRN

## 2013-09-18 NOTE — Patient Instructions (Signed)
Take clonidine 0.1mg  twice daily as needed for blood pressures greater than 160 on top or 110 on bottom. Good to see you today ,call us with questions. Good luck on upcoming surgery!

## 2013-09-18 NOTE — Progress Notes (Signed)
  Subjective:    Patient ID: [name], female    DOB: [birth year], 47 y.o.   MRN: [id number]  HPI CC: f/u HTN  Seen here earlier this month with sinus pressure headache, treated with levofloxacin course and flonase.  sxs resolved. No longer with sinus headache.  At that time BP was very elevated, pt was not worried at all about this.  Declined further meds to help lower blood pressure.  Agreed to return today for a recheck.  On benicar hct 40/25 but she has not taken her AM med. States blood pressure normally runs high at home.  Coreg caused constipation so she stopped.  Amlodipine caused swelling.  Checks BP at home - runs persistently elevated.  Aneurysm surgery is December 2nd.  Returns to surgeons for preop 09/25/2013.  Feels anxiety worsening.  Declines anxiety med. Seen Monday with chest pain at [facility] ER, given nitroglycerin and aspirin and sxs resolved. Denies HA, vision changes, SOB, leg swelling.  Smoking - continues smoking.  More when at home, less when at work.  1-3 cig daily.  Past Medical History  Diagnosis Date  . Hypertension     Review of Systems Per HPI    Objective:   Physical Exam  Nursing note and vitals reviewed. Constitutional: She appears well-developed and well-nourished. No distress.  HENT:  Mouth/Throat: Oropharynx is clear and moist. No oropharyngeal exudate.  Cardiovascular: Normal rate, regular rhythm, normal heart sounds and intact distal pulses.   No murmur heard. Pulmonary/Chest: Effort normal and breath sounds normal. No respiratory distress. She has no wheezes. She has no rales.  Musculoskeletal: She exhibits no edema.  Skin: Skin is warm and dry. No rash noted.       Assessment & Plan:

## 2013-09-18 NOTE — Assessment & Plan Note (Signed)
Pending upcoming surgery.

## 2013-09-18 NOTE — Assessment & Plan Note (Signed)
Remains persistently against second daily antihypertensive, agrees to PRN med. Will treat with clonidine prn SBP >160.  Pt agrees with this. Declines flu shot today.

## 2013-09-18 NOTE — Progress Notes (Signed)
Pre-visit discussion using our clinic review tool. No additional management support is needed unless otherwise documented below in the visit note.  

## 2013-09-29 ENCOUNTER — Ambulatory Visit: Payer: BC Managed Care – PPO | Admitting: Internal Medicine

## 2013-09-29 ENCOUNTER — Ambulatory Visit: Payer: BC Managed Care – PPO | Admitting: Neurology

## 2013-09-30 ENCOUNTER — Telehealth: Payer: Self-pay | Admitting: Family Medicine

## 2013-09-30 NOTE — Telephone Encounter (Signed)
Pt dropped off FMLA forms to be completed.  I have completed most of the forms w/the exception of a few sections (these sections are flagged).  I have placed these forms in an orange folder in Dr. Elmer Sow in box.  Please review the forms, complete flagged sections, and return to me. Thank you.

## 2013-10-06 ENCOUNTER — Other Ambulatory Visit: Payer: Self-pay | Admitting: *Deleted

## 2013-10-06 MED ORDER — OLMESARTAN MEDOXOMIL-HCTZ 40-25 MG PO TABS: 1.0000 | ORAL_TABLET | Freq: Every day | ORAL | Status: DC

## 2013-10-06 NOTE — Telephone Encounter (Signed)
Faxed forms to [employer].

## 2013-10-20 ENCOUNTER — Telehealth: Payer: Self-pay

## 2013-10-20 NOTE — Telephone Encounter (Signed)
plz call [name] back - pt had craniotomy procedure

## 2013-10-20 NOTE — Telephone Encounter (Signed)
[name] with [employer] insurance left v/m; attending physician statement filled out by Dr Reece Agar was received and [name] needs confirmation on which surgery pt had on 10/06/13 an embolization or craniotomy.Please advise.

## 2013-10-21 NOTE — Telephone Encounter (Signed)
Cathy notified

## 2013-10-26 ENCOUNTER — Ambulatory Visit: Payer: BC Managed Care – PPO | Admitting: Neurology

## 2013-10-26 ENCOUNTER — Encounter: Payer: Self-pay | Admitting: Neurology

## 2014-02-25 ENCOUNTER — Other Ambulatory Visit: Payer: Self-pay | Admitting: *Deleted

## 2014-02-26 ENCOUNTER — Non-Acute Institutional Stay (SKILLED_NURSING_FACILITY): Payer: 59 | Admitting: Internal Medicine

## 2014-02-26 DIAGNOSIS — R1319 Other dysphagia: Secondary | ICD-10-CM

## 2014-02-26 DIAGNOSIS — R569 Unspecified convulsions: Secondary | ICD-10-CM

## 2014-02-26 DIAGNOSIS — I6350 Cerebral infarction due to unspecified occlusion or stenosis of unspecified cerebral artery: Secondary | ICD-10-CM

## 2014-02-26 DIAGNOSIS — R000 Tachycardia, unspecified: Secondary | ICD-10-CM

## 2014-02-26 DIAGNOSIS — I639 Cerebral infarction, unspecified: Secondary | ICD-10-CM

## 2014-02-26 DIAGNOSIS — I82409 Acute embolism and thrombosis of unspecified deep veins of unspecified lower extremity: Secondary | ICD-10-CM

## 2014-02-26 DIAGNOSIS — K219 Gastro-esophageal reflux disease without esophagitis: Secondary | ICD-10-CM

## 2014-02-26 NOTE — Progress Notes (Signed)
Patient ID: Donald PoreLaura Marie Kithcart, female   DOB: 08/31/1966, 48 y.o.   MRN: [id number]     [name]   PCP: [name], MD  Code Status: full code  Allergies  Allergen Reactions  . Amlodipine Swelling  . Latex   . Penicillins Swelling    Chief Complaint  Patient presents with  . Hospitalization Follow-up    new admit    HPI:  48 y/o female patient is here for rehabilitation after [facility] and [facility] from 10/06/13- 02/23/14. 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  She then had pseudomonas pneumonia with acute respiratory failure and is s/p trach. 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 Plan is to have trach in place for alteast 3 months. She also had proteus UTI. cdiff was ruled out but pt has hx of recurrent cdiff. She has a peg tube and prealbumin level checked in rehab centre was normal.she was seen by neurology, ID, pulmonary and ENT team. I do not see a proper discharge summary but above information has been collected from few paperwork that have been sent with the patient. There is no updated medication list on discharge for review.  She was seen in her room today. She is tachycardic and tachypneic. She is non verbal and unable to follow commands at present. She coughs intermittently during exam and sounds rhonchrous but not able to get any mucus on deep suction. Unable to obtain history or ROS from patient. As per staff, she has been tachycardic and received a dose of lopressor 12.5 mg x 1 yesterday Patient has not received her vimpat and robitussin yet (due to pharmacy reasons)  Review of Systems:  Unable to obtain  Past Medical History  Diagnosis Date  . Hypertension    Past Surgical History  Procedure Laterality Date  . Abdominal hysterectomy    .  Cholecystectomy     Social History:   reports that she has been smoking Cigarettes.  She has been smoking about 0.00 packs per day. She has never used smokeless tobacco. She reports that she drinks alcohol. She reports that she does not use illicit drugs.  Family History  Problem Relation Age of Onset  . Heart disease Mother 1845    MI    Medications: Patient's Medications  New Prescriptions   No medications on file  Previous Medications   ASPIRIN EC 81 MG TABLET    Take 81 mg by mouth daily.   AZELASTINE (ASTELIN) 137 MCG/SPRAY NASAL SPRAY    Place 1 spray into the nose 2 (two) times daily. Use in each nostril as directed   CARBAMAZEPINE (TEGRETOL) 200 MG TABLET    Take 200 mg by mouth daily.   CLONIDINE (CATAPRES) 0.1 MG TABLET    Take 1 tablet (0.1 mg total) by mouth 2 (two) times daily as needed. Take if blood pressure >160/110   FLUTICASONE (FLONASE) 50 MCG/ACT NASAL SPRAY    Place 2 sprays into the nose daily.   OLMESARTAN-HYDROCHLOROTHIAZIDE (BENICAR HCT) 40-25 MG PER TABLET    Take 1 tablet by mouth daily.  Modified Medications   No medications on file  Discontinued Medications   No medications on file     Physical Exam: Filed Vitals:   02/26/14 0947  BP: 135/90  Pulse: 131  Temp: 98.1 F (36.7 C)  Resp: 24  General- adult female in no acute distress Head- atraumatic, normocephalic Eyes- PERRLA, EOMI, no pallor, no icterus, no discharge Neck- no lymphadenopathy, trach in place Throat- moist mucus membrane Cardiovascular- tachycardic  Respiratory- bilateral rhonchi Abdomen- bowel sounds present, soft, non tender,peg tube in place and site clean, has foley catheter in place Musculoskeletal- both her legs are on braces and she has foot drop, able to move her fingers some but otherwise has both upper extremities weakness.  Neurological- unable to assess, has aphasia Skin- warm and dry. Some mild erythematous excoriation on the buttock area and underneath her  breasts Psychiatry- alert but unable to assess orientation   Labs reviewed: 02/22/14 wbc 7.9, hb 12.1, hct 36.2, plt 371, mcv 84, mg 2.1 02/24/14 na 135, k 4.5, bun 13, cr 0.48, inr 2.26 02/26/14 wbc 11.3, hb 11.6, hct 33.8, plt 339, na 136, k 4.3, glu 122, alt 36, rest lft wnl, ca 9.5  Stat EKG- sinus tachycardia noted  Radiological Exams: None available to review   Assessment/Plan  Acute CVA- is bed bound at present. Continue warfarin, hydralazine and new lopressor. Stop propranolol. Will have her be assessed by PT/OT  DVT- continue coumadin and check inr - goal 2-3  Tachyarrythmia- p wave present, sinus tachycardia. anxiety in setting of her current situation CVA along with her trach could be contributing some. Hyperthyroidism to be ruled out. Reviewed stat lab- anemia and acute hypovolemia ruled out. Will give her lopressor 100 mg x 1 now and assess further. Will avoid BZD to avoid cns and respiratory centre depression. If heart rate does not slow down, will send her to hospital for ruling out MI , telemonitoring and further work up.   Seizure- remains seizure free. Will start her vimpat and continue her keppra  GERD- continue famotidine for now  Dysphagia- continue peg tube feed with free water flushes  Acute respiratory failure- s/p trach, continue trach care and monitor breathing status  Urinary incontinence- has foley in place at present  Unable to review updated med list from discharge- have called prior facility a couple of times and [facility]  Family/ staff Communication: reviewed care plan with patient and nursing supervisor  Goals of care: rehabilitation   Labs/tests ordered: stat cbc, cmp, cxr, ekg ordered. Rest reviewed, cxr result pending    Oneal GroutMAHIMA Alejandria Wessells, MD  [facility] [phone] (Monday-Friday 8 am - 5 pm) [phone] (afterhours)

## 2014-03-05 ENCOUNTER — Non-Acute Institutional Stay (SKILLED_NURSING_FACILITY): Payer: 59 | Admitting: Internal Medicine

## 2014-03-05 DIAGNOSIS — I82409 Acute embolism and thrombosis of unspecified deep veins of unspecified lower extremity: Secondary | ICD-10-CM

## 2014-03-05 DIAGNOSIS — J189 Pneumonia, unspecified organism: Secondary | ICD-10-CM

## 2014-03-05 NOTE — Progress Notes (Signed)
Patient ID: [name], female   DOB: 05/22/1966, 48 y.o.   MRN: [id number]    [name]  Chief Complaint  Patient presents with  . Acute Visit    fever, tachycardia, pneumonia   Allergies  Allergen Reactions  . Amlodipine Swelling  . Latex   . Penicillins Swelling   HPI 48 y/o female patient is here for rehabilitation. She had subarachnoid hemorrhage, bilateral ACA strokes and left caudate stroke with seizure and respiratory failure requiring trach. She has been having fever with increased green secretions from her trach site. Her cxr has shown some vascular congestion with right lobe pneumonia. This am she had a temperature of 101. She was started on doxycycline for pneumonia yesterday but continues to have temperature spike. On review of chart had pseudomonas infection in hospital. Patient resting at present and in no acute distress. Unable to provide any hx or ROS.   Review of Systems Unable to obtain from patient Gets peg tube feed No vomiting or diarrhea reported No diaphoresis Has foley in place  Past Medical History  Diagnosis Date  . Hypertension    Past Surgical History  Procedure Laterality Date  . Abdominal hysterectomy    . Cholecystectomy     [facility]  Physical exam BP 126/84  Pulse 95  Temp(Src) 99.5 F (37.5 C)  Resp 19  General- adult female in no acute distress Head- atraumatic, normocephalic Eyes- PERRLA, EOMI, no pallor, no icterus, no discharge Neck- no lymphadenopathy, trach in place, increased secretion on suction, no blood noted Throat- moist mucus membrane Cardiovascular- tachycardic   Respiratory- bilateral rhonchi and poor air entry right > left Abdomen- bowel sounds present, soft, non tender,peg tube in place and site clean, has foley catheter in place Musculoskeletal- both her legs are on braces and she has foot drop, able to move her fingers some but otherwise has both upper extremities weakness.    Neurological- unable to assess, has aphasia Skin- warm and dry.  Psychiatry- alert but unable to assess orientation  Labs reviewed: 02/22/14 wbc 7.9, hb 12.1, hct 36.2, plt 371, mcv 84, mg 2.1 02/24/14 na 135, k 4.5, bun 13, cr 0.48, inr 2.26 02/26/14 wbc 11.3, hb 11.6, hct 33.8, plt 339, na 136, k 4.3, glu 122, alt 36, rest lft wnl, ca 9.5 03/04/14 cxr- right upper lobe pneumonia, central pulmonary venous congestion  Assessment/Plan  HCAP- has pneumonia seen on cxr. Had fever this am. Currently afebrile, if patient spikes further while being on antibiotic, will need to send blood culture.Will d/c doxycycline and have her covered broadly on clindamycin 600 mg tid for MRSA coverage and levofloxacin 750 mg daily for pseudomonas coverage for now for a week. Continue florastor. Continue breathing support through trach site. Monitor temp curve  DVT- continue coumadin 11 mg daily, inr today 2.3. check inr - goal 2-3 on 03/10/14  Family/ staff Communication: reviewed care plan with nursing supervisor  Goals of care: rehabilitation

## 2014-03-18 ENCOUNTER — Non-Acute Institutional Stay (SKILLED_NURSING_FACILITY): Payer: 59 | Admitting: Internal Medicine

## 2014-03-18 DIAGNOSIS — R21 Rash and other nonspecific skin eruption: Secondary | ICD-10-CM

## 2014-03-18 NOTE — Progress Notes (Signed)
Patient ID: [name], female   DOB: 05/06/1966, 48 y.o.[name]   MRN: [id number]    [name]  Chief Complaint  Patient presents with  . Acute Visit    rash in between thighs   HPI 48 y/o female patient is seen today for rash in her upper medial thighs. She has wound in her buttock wbut they are healing.  She had subarachnoid hemorrhage, bilateral ACA strokes and left caudate stroke with seizure and respiratory failure requiring trach. Patient resting at present and in no acute distress. Unable to provide any hx or ROS.   Review of Systems Unable to obtain from patient Gets peg tube feed No rash elsewhere Has foley in place  Past Medical History  Diagnosis Date  . Hypertension    [facility]  Physical exam BP 113/70  Pulse 90  Temp(Src) 97.5 F (36.4 C)  Resp 18  General- adult female in no acute distress, resting in bed, eyes open Head- atraumatic, normocephalic Eyes- PERRLA, EOMI, no pallor, no icterus, no discharge Neck- no lymphadenopathy, trach in place Cardiovascular-normal s1,s2, rrr   Abdomen- bowel sounds present, soft, non tender,peg tube in place and site clean, has foley catheter in place Musculoskeletal- transferred by hoyer lift, foot drop Skin- red rash at upper medical thigh, pillows between her legs, redness underneath abdominal folds as well Neurological- unable to assess, has aphasia Psychiatry- alert but unable to assess orientation  Assessment/plan  Fungal rash- on skin. Will have her on fluconazole 150 mg once a week for 3 weeks. Will also have her on nystatin cream bid for a week, keep area clean and dry to help prevent collection of moisture

## 2014-03-26 ENCOUNTER — Non-Acute Institutional Stay (SKILLED_NURSING_FACILITY): Payer: 59 | Admitting: Internal Medicine

## 2014-03-26 DIAGNOSIS — J0410 Acute tracheitis without obstruction: Secondary | ICD-10-CM

## 2014-03-29 ENCOUNTER — Encounter: Payer: Self-pay | Admitting: Internal Medicine

## 2014-03-29 NOTE — Progress Notes (Signed)
Patient ID: [name], female   DOB: 1966-09-06, 48 y.o.   MRN: [id number]  Location:  [facility] Provider:  Elmarie Shiley L. Renato Gails, D.O., C.M.D.  Code Status:  Full code  Chief Complaint  Patient presents with  . Acute Visit    green sputum, afebrile, negative CXR    HPI:  48 yo female here for short term rehab after hospitalization for subarachnoid hemorrhage from an aneurysm, seizure, pseudomonal pneumonia with acute respiratory failure requiring tracheostomy--she had tracheomalacia. She is to keep the trach for at least three mos from 4/24.    She also has a PEG in place. Staff asked me to see her today due to green, foul smelling sputum coming out of her trach.  She's been afebrile, but diaphoretic and her CXR was negative for acute pneumonia.    Review of Systems:  Review of Systems  Constitutional: Positive for malaise/fatigue and diaphoresis. Negative for fever.  HENT: Negative for congestion and sore throat.   Respiratory: Positive for cough and sputum production. Negative for shortness of breath, wheezing and stridor.   Cardiovascular: Negative for chest pain and leg swelling.  Genitourinary: Negative for dysuria.    Medications: Patient's Medications  New Prescriptions   No medications on file  Previous Medications   AZELASTINE (ASTELIN) 137 MCG/SPRAY NASAL SPRAY    Place 1 spray into the nose 2 (two) times daily. Use in each nostril as directed   CHLORHEXIDINE (PERIDEX) 0.12 % SOLUTION    Use as directed 5 mLs in the mouth or throat 2 (two) times daily.   CHOLESTYRAMINE (QUESTRAN) 4 G PACKET    Take 4 g by mouth 3 (three) times daily.   DICLOFENAC SODIUM (VOLTAREN) 1 % GEL    Apply 2 g topically 4 (four) times daily.   FAMOTIDINE (PEPCID) 20 MG TABLET    Take 20 mg by mouth 2 (two) times daily.   GUAIFENESIN-CODEINE (ROBITUSSIN AC) 100-10 MG/5ML SYRUP    Take 10 mLs by mouth 4 (four) times daily as needed for cough.   HYDRALAZINE (APRESOLINE) 50 MG  TABLET    Take 50 mg by mouth 2 (two) times daily.   LACOSAMIDE (VIMPAT) 50 MG TABS TABLET    Take 100 mg by mouth 2 (two) times daily.   LEVETIRACETAM (KEPPRA) 500 MG/5ML INJECTION    1,500 mg by PEG Tube route 2 (two) times daily. 500 mg/5 ml 15 ml bid   METOPROLOL TARTRATE (LOPRESSOR) 25 MG TABLET    Take 12.5 mg by mouth 2 (two) times daily.   MULTIPLE VITAMINS-MINERALS (CEROVITE ADVANCED FORMULA) LIQD    Take 15 mLs by mouth daily.   NYSTATIN (MYCOSTATIN) POWDER    Apply 100,000 g topically 2 (two) times daily.   PHENYTOIN (DILANTIN) 100 MG/4ML SUSPENSION    Take 200 mg by mouth 2 (two) times daily.   PRAZOSIN (MINIPRESS) 2 MG CAPSULE    Take 2 mg by mouth at bedtime.   PROPRANOLOL (INDERAL) 10 MG TABLET    Take 20 mg by mouth 3 (three) times daily.   TRAZODONE (DESYREL) 100 MG TABLET    Take 100 mg by mouth at bedtime.   WARFARIN (COUMADIN) 10 MG TABLET    Take 10 mg by mouth daily.  Modified Medications   No medications on file  Discontinued Medications   No medications on file    Physical Exam: Physical Exam  Vitals reviewed. Constitutional:  Diaphoretic, afebrile female  Neck:  Trach in place  Cardiovascular:  Normal rate, regular rhythm, normal heart sounds and intact distal pulses.   Pulmonary/Chest: Effort normal. She exhibits no tenderness.  Rhonchi present, green sputum coming out of trach with coughing--foul smelling  Abdominal: Soft. Bowel sounds are normal. She exhibits no distension and no mass. There is no tenderness.  PEG in place  Neurological:  Aphasic female seated in reclining wheelchair    Significant Diagnostic Results:  CXR:  No acute cardiopulmonary disease  Assessment/Plan 1. Acute tracheitis without mention of obstruction -possibly with some bronchitis, as well -will treat with clindamycin 6144m/kg/day divided q8h x 10 days after cbc, bmp and sputum cultures are obtained -ensure peg flushes are adequate -aspiration precautions also are in place and  pt working with PT, OT, ST  Family/ staff Communication: discussed the plan with her son who was present, pt's sister also present writing in a notebook

## 2014-03-31 ENCOUNTER — Emergency Department (HOSPITAL_COMMUNITY): Payer: 59

## 2014-03-31 ENCOUNTER — Encounter (HOSPITAL_COMMUNITY): Payer: Self-pay | Admitting: Emergency Medicine

## 2014-03-31 ENCOUNTER — Inpatient Hospital Stay (HOSPITAL_COMMUNITY)
Admission: EM | Admit: 2014-03-31 | Discharge: 2014-04-07 | DRG: 202 | Disposition: A | Discharge status: Skilled Nursing Facility | Payer: 59 | Attending: Pulmonary Disease | Admitting: Pulmonary Disease

## 2014-03-31 DIAGNOSIS — R002 Palpitations: Secondary | ICD-10-CM

## 2014-03-31 DIAGNOSIS — M5481 Occipital neuralgia: Secondary | ICD-10-CM

## 2014-03-31 DIAGNOSIS — E669 Obesity, unspecified: Secondary | ICD-10-CM

## 2014-03-31 DIAGNOSIS — Z6837 Body mass index (BMI) 37.0-37.9, adult: Secondary | ICD-10-CM

## 2014-03-31 DIAGNOSIS — I10 Essential (primary) hypertension: Secondary | ICD-10-CM | POA: Diagnosis present

## 2014-03-31 DIAGNOSIS — Z8249 Family history of ischemic heart disease and other diseases of the circulatory system: Secondary | ICD-10-CM

## 2014-03-31 DIAGNOSIS — L89109 Pressure ulcer of unspecified part of back, unspecified stage: Secondary | ICD-10-CM | POA: Diagnosis present

## 2014-03-31 DIAGNOSIS — I671 Cerebral aneurysm, nonruptured: Secondary | ICD-10-CM

## 2014-03-31 DIAGNOSIS — I82409 Acute embolism and thrombosis of unspecified deep veins of unspecified lower extremity: Secondary | ICD-10-CM

## 2014-03-31 DIAGNOSIS — J988 Other specified respiratory disorders: Secondary | ICD-10-CM

## 2014-03-31 DIAGNOSIS — Z86718 Personal history of other venous thrombosis and embolism: Secondary | ICD-10-CM

## 2014-03-31 DIAGNOSIS — Z880 Allergy status to penicillin: Secondary | ICD-10-CM

## 2014-03-31 DIAGNOSIS — D649 Anemia, unspecified: Secondary | ICD-10-CM | POA: Diagnosis present

## 2014-03-31 DIAGNOSIS — Z7901 Long term (current) use of anticoagulants: Secondary | ICD-10-CM

## 2014-03-31 DIAGNOSIS — J9610 Chronic respiratory failure, unspecified whether with hypoxia or hypercapnia: Secondary | ICD-10-CM | POA: Diagnosis present

## 2014-03-31 DIAGNOSIS — K219 Gastro-esophageal reflux disease without esophagitis: Secondary | ICD-10-CM | POA: Diagnosis present

## 2014-03-31 DIAGNOSIS — Z91040 Latex allergy status: Secondary | ICD-10-CM

## 2014-03-31 DIAGNOSIS — J0410 Acute tracheitis without obstruction: Secondary | ICD-10-CM

## 2014-03-31 DIAGNOSIS — J309 Allergic rhinitis, unspecified: Secondary | ICD-10-CM

## 2014-03-31 DIAGNOSIS — E46 Unspecified protein-calorie malnutrition: Secondary | ICD-10-CM | POA: Diagnosis present

## 2014-03-31 DIAGNOSIS — F17200 Nicotine dependence, unspecified, uncomplicated: Secondary | ICD-10-CM | POA: Diagnosis present

## 2014-03-31 DIAGNOSIS — J398 Other specified diseases of upper respiratory tract: Secondary | ICD-10-CM | POA: Diagnosis present

## 2014-03-31 DIAGNOSIS — R51 Headache: Secondary | ICD-10-CM

## 2014-03-31 DIAGNOSIS — J189 Pneumonia, unspecified organism: Secondary | ICD-10-CM

## 2014-03-31 DIAGNOSIS — Z931 Gastrostomy status: Secondary | ICD-10-CM

## 2014-03-31 DIAGNOSIS — R0789 Other chest pain: Secondary | ICD-10-CM

## 2014-03-31 DIAGNOSIS — Z930 Tracheostomy status: Secondary | ICD-10-CM

## 2014-03-31 DIAGNOSIS — Z8673 Personal history of transient ischemic attack (TIA), and cerebral infarction without residual deficits: Secondary | ICD-10-CM

## 2014-03-31 DIAGNOSIS — R1030 Lower abdominal pain, unspecified: Secondary | ICD-10-CM

## 2014-03-31 DIAGNOSIS — Z888 Allergy status to other drugs, medicaments and biological substances status: Secondary | ICD-10-CM

## 2014-03-31 DIAGNOSIS — R609 Edema, unspecified: Secondary | ICD-10-CM

## 2014-03-31 DIAGNOSIS — L8990 Pressure ulcer of unspecified site, unspecified stage: Secondary | ICD-10-CM | POA: Diagnosis present

## 2014-03-31 DIAGNOSIS — E870 Hyperosmolality and hypernatremia: Secondary | ICD-10-CM | POA: Diagnosis not present

## 2014-03-31 DIAGNOSIS — R569 Unspecified convulsions: Secondary | ICD-10-CM | POA: Diagnosis present

## 2014-03-31 DIAGNOSIS — Z720 Tobacco use: Secondary | ICD-10-CM

## 2014-03-31 DIAGNOSIS — R519 Headache, unspecified: Secondary | ICD-10-CM

## 2014-03-31 DIAGNOSIS — R1310 Dysphagia, unspecified: Secondary | ICD-10-CM | POA: Diagnosis present

## 2014-03-31 DIAGNOSIS — F411 Generalized anxiety disorder: Secondary | ICD-10-CM | POA: Diagnosis present

## 2014-03-31 DIAGNOSIS — E8779 Other fluid overload: Secondary | ICD-10-CM | POA: Diagnosis not present

## 2014-03-31 DIAGNOSIS — R000 Tachycardia, unspecified: Secondary | ICD-10-CM | POA: Diagnosis present

## 2014-03-31 HISTORY — DX: Cerebral infarction, unspecified: I63.9

## 2014-03-31 LAB — URINALYSIS, ROUTINE W REFLEX MICROSCOPIC
BILIRUBIN URINE: NEGATIVE
Glucose, UA: NEGATIVE mg/dL
Hgb urine dipstick: NEGATIVE
Ketones, ur: NEGATIVE mg/dL
NITRITE: NEGATIVE
Protein, ur: 30 mg/dL — AB
SPECIFIC GRAVITY, URINE: 1.031 — AB (ref 1.005–1.030)
UROBILINOGEN UA: 0.2 mg/dL (ref 0.0–1.0)
pH: 5.5 (ref 5.0–8.0)

## 2014-03-31 LAB — COMPREHENSIVE METABOLIC PANEL
ALBUMIN: 3.1 g/dL — AB (ref 3.5–5.2)
ALK PHOS: 102 U/L (ref 39–117)
ALT: 35 U/L (ref 0–35)
AST: 28 U/L (ref 0–37)
BUN: 12 mg/dL (ref 6–23)
CALCIUM: 9.5 mg/dL (ref 8.4–10.5)
CO2: 25 mEq/L (ref 19–32)
Chloride: 97 mEq/L (ref 96–112)
Creatinine, Ser: 0.53 mg/dL (ref 0.50–1.10)
GFR calc Af Amer: >90 mL/min (ref >90)
GFR calc non Af Amer: >90 mL/min (ref >90)
Glucose, Bld: 111 mg/dL — ABNORMAL HIGH (ref 70–99)
POTASSIUM: 3.9 meq/L (ref 3.7–5.3)
Sodium: 136 mEq/L — ABNORMAL LOW (ref 137–147)
TOTAL PROTEIN: 7.1 g/dL (ref 6.0–8.3)
Total Bilirubin: 0.2 mg/dL — ABNORMAL LOW (ref 0.3–1.2)

## 2014-03-31 LAB — CBC WITH DIFFERENTIAL/PLATELET
BASOS PCT: 0 % (ref 0–1)
Basophils Absolute: 0 10*3/uL (ref 0.0–0.1)
Eosinophils Absolute: 0.1 10*3/uL (ref 0.0–0.7)
Eosinophils Relative: 1 % (ref 0–5)
HCT: 35.9 % — ABNORMAL LOW (ref 36.0–46.0)
HEMOGLOBIN: 12 g/dL (ref 12.0–15.0)
LYMPHS ABS: 1.9 10*3/uL (ref 0.7–4.0)
Lymphocytes Relative: 21 % (ref 12–46)
MCH: 28.7 pg (ref 26.0–34.0)
MCHC: 33.4 g/dL (ref 30.0–36.0)
MCV: 85.9 fL (ref 78.0–100.0)
MONOS PCT: 17 % — AB (ref 3–12)
Monocytes Absolute: 1.5 10*3/uL — ABNORMAL HIGH (ref 0.1–1.0)
NEUTROS PCT: 61 % (ref 43–77)
Neutro Abs: 5.7 10*3/uL (ref 1.7–7.7)
Platelets: 244 10*3/uL (ref 150–400)
RBC: 4.18 MIL/uL (ref 3.87–5.11)
RDW: 16.8 % — ABNORMAL HIGH (ref 11.5–15.5)
WBC: 9.3 10*3/uL (ref 4.0–10.5)

## 2014-03-31 LAB — URINE MICROSCOPIC-ADD ON

## 2014-03-31 LAB — I-STAT VENOUS BLOOD GAS, ED
Acid-base deficit: 1 mmol/L (ref 0.0–2.0)
BICARBONATE: 24 meq/L (ref 20.0–24.0)
O2 SAT: 81 %
PO2 VEN: 46 mmHg — AB (ref 30.0–45.0)
TCO2: 25 mmol/L (ref 0–100)
pCO2, Ven: 40.2 mmHg — ABNORMAL LOW (ref 45.0–50.0)
pH, Ven: 7.384 — ABNORMAL HIGH (ref 7.250–7.300)

## 2014-03-31 LAB — CBG MONITORING, ED
Glucose-Capillary: 112 mg/dL — ABNORMAL HIGH (ref 70–99)
Glucose-Capillary: 131 mg/dL — ABNORMAL HIGH (ref 70–99)

## 2014-03-31 LAB — PHENYTOIN LEVEL, TOTAL

## 2014-03-31 LAB — PROTIME-INR
INR: 1.14 (ref 0.00–1.49)
PROTHROMBIN TIME: 14.4 s (ref 11.6–15.2)

## 2014-03-31 LAB — I-STAT TROPONIN, ED: Troponin i, poc: 0.01 ng/mL (ref 0.00–0.08)

## 2014-03-31 LAB — I-STAT CG4 LACTIC ACID, ED: LACTIC ACID, VENOUS: 1.79 mmol/L (ref 0.5–2.2)

## 2014-03-31 LAB — MRSA PCR SCREENING: MRSA BY PCR: NEGATIVE

## 2014-03-31 MED ORDER — PHENYTOIN 125 MG/5ML PO SUSP
200.0000 mg | Freq: Two times a day (BID) | ORAL | Status: DC
  Administered 2014-03-31 – 2014-04-07 (×15): 200 mg
  Filled 2014-03-31 (×17): qty 8

## 2014-03-31 MED ORDER — VANCOMYCIN HCL IN DEXTROSE 1-5 GM/200ML-% IV SOLN
1000.0000 mg | Freq: Once | INTRAVENOUS | Status: AC
  Administered 2014-03-31: 1000 mg via INTRAVENOUS
  Filled 2014-03-31: qty 200

## 2014-03-31 MED ORDER — PRAZOSIN HCL 2 MG PO CAPS
2.0000 mg | ORAL_CAPSULE | Freq: Every day | ORAL | Status: DC
  Administered 2014-03-31 – 2014-04-02 (×3): 2 mg
  Filled 2014-03-31 (×4): qty 1

## 2014-03-31 MED ORDER — DEXTROSE 5 % IV SOLN
2.0000 g | Freq: Once | INTRAVENOUS | Status: AC
  Administered 2014-03-31: 2 g via INTRAVENOUS

## 2014-03-31 MED ORDER — LACOSAMIDE 50 MG PO TABS
100.0000 mg | ORAL_TABLET | Freq: Two times a day (BID) | ORAL | Status: DC
  Administered 2014-03-31 – 2014-04-07 (×15): 100 mg
  Filled 2014-03-31 (×15): qty 2

## 2014-03-31 MED ORDER — ALBUTEROL SULFATE (2.5 MG/3ML) 0.083% IN NEBU
2.5000 mg | INHALATION_SOLUTION | Freq: Four times a day (QID) | RESPIRATORY_TRACT | Status: DC
  Administered 2014-03-31 – 2014-04-07 (×29): 2.5 mg via RESPIRATORY_TRACT
  Filled 2014-03-31 (×30): qty 3

## 2014-03-31 MED ORDER — CHOLESTYRAMINE 4 G PO PACK
4.0000 g | PACK | Freq: Three times a day (TID) | ORAL | Status: DC
  Administered 2014-03-31 – 2014-04-07 (×21): 4 g
  Filled 2014-03-31 (×24): qty 1

## 2014-03-31 MED ORDER — SODIUM CHLORIDE 0.9 % IV SOLN
1000.0000 mL | INTRAVENOUS | Status: DC
  Administered 2014-04-02: 1000 mL via INTRAVENOUS

## 2014-03-31 MED ORDER — DEXTROSE 5 % IV SOLN
1.0000 g | Freq: Three times a day (TID) | INTRAVENOUS | Status: DC
  Administered 2014-04-01 – 2014-04-06 (×17): 1 g via INTRAVENOUS
  Filled 2014-03-31 (×20): qty 1

## 2014-03-31 MED ORDER — HYDRALAZINE HCL 50 MG PO TABS
50.0000 mg | ORAL_TABLET | Freq: Two times a day (BID) | ORAL | Status: DC
  Administered 2014-03-31 – 2014-04-03 (×7): 50 mg
  Filled 2014-03-31 (×8): qty 1

## 2014-03-31 MED ORDER — SODIUM CHLORIDE 0.9 % IV SOLN
1000.0000 mL | Freq: Once | INTRAVENOUS | Status: AC
  Administered 2014-03-31: 1000 mL via INTRAVENOUS

## 2014-03-31 MED ORDER — SODIUM CHLORIDE 0.9 % IV SOLN
1000.0000 mL | INTRAVENOUS | Status: DC
  Administered 2014-03-31: 1000 mL via INTRAVENOUS

## 2014-03-31 MED ORDER — INSULIN ASPART 100 UNIT/ML ~~LOC~~ SOLN
0.0000 [IU] | SUBCUTANEOUS | Status: DC
  Administered 2014-03-31 – 2014-04-07 (×12): 2 [IU] via SUBCUTANEOUS
  Filled 2014-03-31: qty 1

## 2014-03-31 MED ORDER — JEVITY 1.2 CAL PO LIQD
1000.0000 mL | ORAL | Status: DC
  Administered 2014-03-31: 20:00:00
  Filled 2014-03-31 (×3): qty 1000

## 2014-03-31 MED ORDER — FAMOTIDINE 40 MG/5ML PO SUSR
20.0000 mg | Freq: Two times a day (BID) | ORAL | Status: DC
Start: 2014-03-31 — End: 2014-04-07
  Administered 2014-03-31 – 2014-04-07 (×15): 20 mg
  Filled 2014-03-31 (×18): qty 2.5

## 2014-03-31 MED ORDER — LORAZEPAM 2 MG/ML IJ SOLN: 1.0000 mg | INTRAMUSCULAR | Status: DC | PRN

## 2014-03-31 MED ORDER — VANCOMYCIN HCL 10 G IV SOLR
1250.0000 mg | Freq: Two times a day (BID) | INTRAVENOUS | Status: DC
  Administered 2014-03-31 – 2014-04-01 (×2): 1250 mg via INTRAVENOUS
  Filled 2014-03-31 (×5): qty 1250

## 2014-03-31 MED ORDER — LORAZEPAM 2 MG/ML IJ SOLN
1.0000 mg | Freq: Once | INTRAMUSCULAR | Status: AC
  Administered 2014-03-31: 1 mg via INTRAVENOUS
  Filled 2014-03-31: qty 1

## 2014-03-31 MED ORDER — ACETAMINOPHEN 650 MG RE SUPP
650.0000 mg | Freq: Once | RECTAL | Status: AC
  Administered 2014-03-31: 650 mg via RECTAL
  Filled 2014-03-31 (×2): qty 1

## 2014-03-31 MED ORDER — LEVETIRACETAM 100 MG/ML PO SOLN
1500.0000 mg | Freq: Two times a day (BID) | ORAL | Status: DC
  Administered 2014-03-31 – 2014-04-07 (×15): 1500 mg via ORAL
  Filled 2014-03-31 (×18): qty 15

## 2014-03-31 MED ORDER — TRAZODONE HCL 100 MG PO TABS
100.0000 mg | ORAL_TABLET | Freq: Every day | ORAL | Status: DC
  Administered 2014-03-31 – 2014-04-06 (×7): 100 mg
  Filled 2014-03-31 (×8): qty 1

## 2014-03-31 MED ORDER — METOPROLOL TARTRATE 25 MG PO TABS
75.0000 mg | ORAL_TABLET | Freq: Two times a day (BID) | ORAL | Status: DC
  Administered 2014-03-31 – 2014-04-03 (×6): 75 mg
  Filled 2014-03-31 (×7): qty 1

## 2014-03-31 MED ORDER — ACETAMINOPHEN 325 MG PO TABS
650.0000 mg | ORAL_TABLET | ORAL | Status: DC | PRN
  Administered 2014-04-02 – 2014-04-05 (×5): 650 mg
  Filled 2014-03-31 (×5): qty 2

## 2014-03-31 NOTE — ED Notes (Signed)
Peg tube placement verified via 20cc of air prior to giving medications.

## 2014-03-31 NOTE — ED Notes (Signed)
Called RT. Pt. Coughing and bringing up secretions.

## 2014-03-31 NOTE — Progress Notes (Signed)
Trach changed per Dr. Craige Cotta from #7 Portex to #6 Cuffed Shiley.  Suction catheter was used for guide exchange.  Portex was removed without issue.  Shiley was placed with minimal amount of pressure.  Site is bleeding from exchange. ETCO2 was used for placement verification and was noted to be positive with color change.  [name] is secure, pts SpO2 is 100% on 35% ATC.  Pt tolerated procedure well.

## 2014-03-31 NOTE — ED Provider Notes (Signed)
CSN: [id number]     Arrival date & time 03/31/14  [id number] History   First MD Initiated Contact with Patient 03/31/14 [phone]     Chief Complaint  Patient presents with  . Tracheostomy Tube Change     (Consider location/radiation/quality/duration/timing/severity/associated sxs/prior Treatment) HPI Comments: Patient from nursing home with fever and increased work of breathing for the past day. Nursing home that she needed a trach replacement and PICC line replacement. She has a history of subarachnoid hemorrhage with previous respiratory failure. She is febrile on arrival with tachypnea. She is in no respiratory distress. She tracts but does not follow commands and does not speak. She has recently been treated at the nursing home for tracheitis. Level 5 caveat secondary to nonverbal  The history is provided by the patient and the EMS personnel. The history is limited by the condition of the patient.    Past Medical History  Diagnosis Date  . Hypertension   . SAH (subarachnoid hemorrhage)   . Seizure   . Stroke   . Tracheostomy status   . Tracheomalacia   . DVT (deep venous thrombosis)    Past Surgical History  Procedure Laterality Date  . Abdominal hysterectomy    . Cholecystectomy     Family History  Problem Relation Age of Onset  . Heart disease Mother 3345    MI   History  Substance Use Topics  . Smoking status: Current Every Day Smoker    Types: Cigarettes  . Smokeless tobacco: Never Used     Comment: trying to quit-2 cigarettes daily  . Alcohol Use: Yes     Comment: Very seldom   OB History   Grav Para Term Preterm Abortions TAB SAB Ect Mult Living                 Review of Systems  Unable to perform ROS: Patient nonverbal      Allergies  Amlodipine; Latex; Other; and Penicillins  Home Medications   Prior to Admission medications   Medication Sig Start Date End Date Taking? Authorizing Provider  azelastine (ASTELIN) 137 MCG/SPRAY nasal spray Place 1 spray into  the nose 2 (two) times daily. Use in each nostril as directed 06/02/13   Dianne Dunalia M Aron, MD  chlorhexidine (PERIDEX) 0.12 % solution Use as directed 5 mLs in the mouth or throat 2 (two) times daily.    Historical Provider, MD  cholestyramine Lanetta Inch(QUESTRAN) 4 G packet Take 4 g by mouth 3 (three) times daily.    Historical Provider, MD  diclofenac sodium (VOLTAREN) 1 % GEL Apply 2 g topically 4 (four) times daily.    Historical Provider, MD  famotidine (PEPCID) 20 MG tablet Take 20 mg by mouth 2 (two) times daily.    Historical Provider, MD  guaiFENesin-codeine (ROBITUSSIN AC) 100-10 MG/5ML syrup Take 10 mLs by mouth 4 (four) times daily as needed for cough.    Historical Provider, MD  hydrALAZINE (APRESOLINE) 50 MG tablet Take 50 mg by mouth 2 (two) times daily.    Historical Provider, MD  lacosamide (VIMPAT) 50 MG TABS tablet Take 100 mg by mouth 2 (two) times daily.    Historical Provider, MD  levETIRAcetam (KEPPRA) 500 MG/5ML injection 1,500 mg by PEG Tube route 2 (two) times daily. 500 mg/5 ml 15 ml bid    Historical Provider, MD  metoprolol tartrate (LOPRESSOR) 25 MG tablet Take 12.5 mg by mouth 2 (two) times daily.    Historical Provider, MD  Multiple Vitamins-Minerals (CEROVITE ADVANCED FORMULA)  LIQD Take 15 mLs by mouth daily.    Historical Provider, MD  nystatin (MYCOSTATIN) powder Apply 100,000 g topically 2 (two) times daily.    Historical Provider, MD  phenytoin (DILANTIN) 100 MG/4ML suspension Take 200 mg by mouth 2 (two) times daily.    Historical Provider, MD  prazosin (MINIPRESS) 2 MG capsule Take 2 mg by mouth at bedtime.    Historical Provider, MD  propranolol (INDERAL) 10 MG tablet Take 20 mg by mouth 3 (three) times daily.    Historical Provider, MD  traZODone (DESYREL) 100 MG tablet Take 100 mg by mouth at bedtime.    Historical Provider, MD  warfarin (COUMADIN) 10 MG tablet Take 10 mg by mouth daily.    Historical Provider, MD   BP 175/95  Pulse 127  Temp(Src) 100 F (37.8 C)  (Rectal)  Resp 32  SpO2 99% Physical Exam  Constitutional: She appears well-developed and well-nourished. No distress.  HENT:  Head: Normocephalic and atraumatic.  Mouth/Throat: Oropharynx is clear and moist. No oropharyngeal exudate.  Eyes: Conjunctivae and EOM are normal. Pupils are equal, round, and reactive to light.  Neck: Normal range of motion. Neck supple.  Tracheostomy in place with scant clear secretions  Cardiovascular: Normal rate, regular rhythm and normal heart sounds.   No murmur heard. Tachycardic  Pulmonary/Chest: Effort normal. No respiratory distress. She has no wheezes.  Lungs are clear without wheezing Mild increased work of breathing  Abdominal: Soft. There is no tenderness. There is no rebound and no guarding.  Musculoskeletal: Normal range of motion. She exhibits no edema and no tenderness.  Neurological: She is alert. No cranial nerve deficit. She exhibits normal muscle tone. Coordination normal.  Does not follow commands, no spontaneous movement  Skin: Skin is warm.    ED Course  Procedures (including critical care time) Labs Review Labs Reviewed  CBC WITH DIFFERENTIAL - Abnormal; Notable for the following:    HCT 35.9 (*)    RDW 16.8 (*)    Monocytes Relative 17 (*)    Monocytes Absolute 1.5 (*)    All other components within normal limits  COMPREHENSIVE METABOLIC PANEL - Abnormal; Notable for the following:    Sodium 136 (*)    Glucose, Bld 111 (*)    Albumin 3.1 (*)    Total Bilirubin 0.2 (*)    All other components within normal limits  URINALYSIS, ROUTINE W REFLEX MICROSCOPIC - Abnormal; Notable for the following:    Color, Urine AMBER (*)    APPearance CLOUDY (*)    Specific Gravity, Urine 1.031 (*)    Protein, ur 30 (*)    Leukocytes, UA SMALL (*)    All other components within normal limits  PHENYTOIN LEVEL, TOTAL - Abnormal; Notable for the following:    Phenytoin Lvl <2.5 (*)    All other components within normal limits  URINE  MICROSCOPIC-ADD ON - Abnormal; Notable for the following:    Bacteria, UA FEW (*)    Casts GRANULAR CAST (*)    Crystals CA OXALATE CRYSTALS (*)    All other components within normal limits  I-STAT VENOUS BLOOD GAS, ED - Abnormal; Notable for the following:    pH, Ven 7.384 (*)    pCO2, Ven 40.2 (*)    pO2, Ven 46.0 (*)    All other components within normal limits  CBG MONITORING, ED - Abnormal; Notable for the following:    Glucose-Capillary 112 (*)    All other components within normal limits  CBG MONITORING, ED - Abnormal; Notable for the following:    Glucose-Capillary 131 (*)    All other components within normal limits  CULTURE, BLOOD (ROUTINE X 2)  CULTURE, BLOOD (ROUTINE X 2)  URINE CULTURE  CULTURE, RESPIRATORY (NON-EXPECTORATED)  PROTIME-INR  BASIC METABOLIC PANEL  CBC  I-STAT CG4 LACTIC ACID, ED  Rosezena Sensor, ED    Imaging Review Dg Chest Port 1 View  03/31/2014   CLINICAL DATA:  Fever and sepsis; tracheostomy exchange  EXAM: PORTABLE CHEST - 1 VIEW  COMPARISON:  None.  FINDINGS: The tracheostomy tip is 3.5 cm above the carina. No pneumothorax. There is atelectasis in the right base. Lungs elsewhere are clear. Heart size and pulmonary vascularity are normal. No adenopathy.  IMPRESSION: Tracheostomy as described without pneumothorax. Atelectasis right base. These changes in the right base could represent early pneumonia. Lungs elsewhere clear.   Electronically Signed   By: Bretta Bang M.D.   On: 03/31/2014 09:12     EKG Interpretation   Date/Time:  Wednesday Mar 31 2014 09:17:16 EDT Ventricular Rate:  114 PR Interval:  138 QRS Duration: 84 QT Interval:  340 QTC Calculation: 468 R Axis:   11 Text Interpretation:  Sinus tachycardia Atrial premature complex Abnormal  R-wave progression, early transition Left ventricular hypertrophy No  previous ECGs available Confirmed by Everrett Lacasse  MD, Kamilya Wakeman ([id number]) on  03/31/2014 9:29:35 AM      MDM   Final  diagnoses:  Tracheitis  HCAP (healthcare-associated pneumonia)   Trach patient with fever and tachycardia and tachypnea.  No hypoxia.  Being treated for tracheitis with clinadmycin at SNF for past several days.  Portex temporary trach in place on arrival.   Code sepsis called.  IVF, antibitiocs, cultures obtained. CXR with possible pneumonia. Patient with frequent coughing after suctioning.  Clear and white mucus obtained. Good air exchange, ventilating well, no CO2 retention on ABG.  portex trach discussed with Dr. Craige Cotta of critical care who agrees with replacement to shiley. This was performed by respiratory therapy under his supervision.  HR and coughing have improved.  Oxygenating well. Admission to ICU arranged by Dr. Craige Cotta. Family updated.  CRITICAL CARE Performed by: Glynn Octave Total critical care time: 30 Critical care time was exclusive of separately billable procedures and treating other patients. Critical care was necessary to treat or prevent imminent or life-threatening deterioration. Critical care was time spent personally by me on the following activities: development of treatment plan with patient and/or surrogate as well as nursing, discussions with consultants, evaluation of patient's response to treatment, examination of patient, obtaining history from patient or surrogate, ordering and performing treatments and interventions, ordering and review of laboratory studies, ordering and review of radiographic studies, pulse oximetry and re-evaluation of patient's condition.       Glynn Octave, MD 03/31/14 1725

## 2014-03-31 NOTE — ED Notes (Signed)
Pt. Was sent to [place] from [facility] for a replacement of a  Temporary trach and also picc line placement.  Her picc line was found out this am. Pt. Does not speak..  Pt. Does have good eye contact. No s/s of resp. Distress. No s/s of pain or discomfort.  Skin is warm and dry,

## 2014-03-31 NOTE — H&P (Signed)
PULMONARY / CRITICAL CARE MEDICINE   Name: [name] MRN: [id number] DOB: 1966-05-31    ADMISSION DATE:  03/31/2014  REFERRING MD :  Rancour  CHIEF COMPLAINT:  Coughing  BRIEF PATIENT DESCRIPTION:  48 yo female from NH with cough.  Hx of SAH complicated by seizure, Pseudomonal PNA, respiratory failure requiring tracheostomy, and tracheomalacia.  PCCM asked to admit to ICU for tracheitis vs pneumonia.  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  She required intubation, and eventual tracheostomy.  Bronchoscopy showed tracheomalacia.  She was transferred to [facility] for rehab.  She developed a cough and concern about tracheitis on 5/22.  She was started on clindamycin.  Her symptoms progressed, and she was sent to ER.  PAST MEDICAL HISTORY :  Past Medical History  Diagnosis Date  . Hypertension   . SAH (subarachnoid hemorrhage)   . Seizure   . Stroke   . Tracheostomy status   . Tracheomalacia   . DVT (deep venous thrombosis)    Past Surgical History  Procedure Laterality Date  . Abdominal hysterectomy    . Cholecystectomy     Prior to Admission medications   Medication Sig Start Date End Date Taking? Authorizing Provider  acetaminophen (TYLENOL) 325 MG tablet Give 650 mg by tube every 4 (four) hours as needed (elevated temperature).   Yes Historical Provider, MD  azelastine (ASTELIN) 0.1 % nasal spray Place 1 spray into both nostrils 2 (two) times daily. Use in each nostril as directed   Yes Historical Provider, MD  cholestyramine Lanetta Inch) 4 G packet Place 4 g into feeding tube 3 (three) times daily.    Yes Historical Provider, MD   CLINDAMYCIN PHOSPHATE IV Inject 1,300 mg into the vein every 8 (eight) hours. Trachelitis. 10 day course started on 03/26/2014@@1609    Yes Historical Provider, MD  diclofenac sodium (VOLTAREN) 1 % GEL Apply 2 g topically 4 (four) times daily.   Yes Historical Provider, MD  famotidine (PEPCID) 20 MG tablet Give 20 mg by tube 2 (two) times daily.    Yes Historical Provider, MD  fluconazole (DIFLUCAN) 150 MG tablet Give 150 mg by tube See admin instructions. Once weekly (Thursday) X 3 weeks for fungal rash. Order started on 03/18/14.   Yes Historical Provider, MD  guaifenesin (ROBAFEN) 100 MG/5ML syrup Give 200 mg by tube every 6 (six) hours as needed for cough.   Yes Historical Provider, MD  hydrALAZINE (APRESOLINE) 50 MG tablet Give 50 mg by tube 2 (two) times daily.    Yes Historical Provider, MD  lacosamide (VIMPAT) 50 MG TABS tablet Give 100 mg by tube 2 (two) times daily.    Yes Historical Provider, MD  levETIRAcetam (KEPPRA) 100 MG/ML solution Take 1,500 mg by mouth 2 (two) times daily.   Yes Historical Provider, MD  metoprolol tartrate (LOPRESSOR) 25 MG tablet Give 75 mg by tube 2 (two) times daily.    Yes Historical Provider, MD  Multiple Vitamins-Minerals (CEROVITE ADVANCED FORMULA) LIQD Give 15 mLs by tube daily at 12 noon.    Yes Historical Provider, MD  phenytoin (DILANTIN) 100 MG/4ML suspension Give 200 mg by tube  every 12 (twelve) hours.    Yes Historical Provider, MD  prazosin (MINIPRESS) 2 MG capsule Give 2 mg by tube at bedtime.    Yes Historical Provider, MD  traZODone (DESYREL) 100 MG tablet Give 100 mg by tube at bedtime.    Yes Historical Provider, MD   Allergies  Allergen Reactions  . Amlodipine Swelling  . Latex Other (See Comments)    Unknown   . Other Other (See Comments)    Natural Rubber- Unknown   . Penicillins Swelling    FAMILY HISTORY:  Family History  Problem Relation Age of Onset  . Heart disease Mother 31    MI   SOCIAL HISTORY:  reports that she has  been smoking Cigarettes.  She has been smoking about 0.00 packs per day. She has never used smokeless tobacco. She reports that she drinks alcohol. She reports that she does not use illicit drugs.  REVIEW OF SYSTEMS:   Pt non verbal  SUBJECTIVE:   VITAL SIGNS: Temp:  [100.7 F (38.2 C)] 100.7 F (38.2 C) (05/27 0832) Resp:  [30] 30 (05/27 0832) BP: (133)/(84) 133/84 mmHg (05/27 0832) SpO2:  [95 %] 95 % (05/27 0832) INTAKE / OUTPUT: Intake/Output   None     PHYSICAL EXAMINATION: General: frequent coughing Neuro:  Awake, alert HEENT:  Trach site with mild irritation Cardiovascular:  Tachycardic, no murmuir Lungs:  B/l rhonchi Abdomen:  Soft, Gtube in place Musculoskeletal:  No edema Skin:  No rashes  LABS:  CBC  Recent Labs Lab 03/31/14 0848  WBC 9.3  HGB 12.0  HCT 35.9*  PLT 244   Coag's  Recent Labs Lab 03/31/14 0848  INR 1.14   BMET  Recent Labs Lab 03/31/14 0848  NA 136*  K 3.9  CL 97  CO2 25  BUN 12  CREATININE 0.53  GLUCOSE 111*   Electrolytes  Recent Labs Lab 03/31/14 0848  CALCIUM 9.5   Sepsis Markers No results found for this basename: LATICACIDVEN, PROCALCITON, O2SATVEN,  in the last 168 hours  ABG No results found for this basename: PHART, PCO2ART, PO2ART,  in the last 168 hours  Liver Enzymes  Recent Labs Lab 03/31/14 0848  AST 28  ALT 35  ALKPHOS 102  BILITOT 0.2*  ALBUMIN 3.1*   Cardiac Enzymes No results found for this basename: TROPONINI, PROBNP,  in the last 168 hours Glucose No results found for this basename: GLUCAP,  in the last 168 hours  Imaging Dg Chest Port 1 View  03/31/2014   CLINICAL DATA:  Fever and sepsis; tracheostomy exchange  EXAM: PORTABLE CHEST - 1 VIEW  COMPARISON:  None.  FINDINGS: The tracheostomy tip is 3.5 cm above the carina. No pneumothorax. There is atelectasis in the right base. Lungs elsewhere are clear. Heart size and pulmonary vascularity are normal. No adenopathy.  IMPRESSION:  Tracheostomy as described without pneumothorax. Atelectasis right base. These changes in the right base could represent early pneumonia. Lungs elsewhere clear.   Electronically Signed   By: Bretta Bang M.D.   On: 03/31/2014 09:12     ASSESSMENT / PLAN:  PULMONARY A: Tracheitis vs PNA. P:   Trach care Oxygen as needed F/u CXR Bronchial hygiene Scheduled BD's  CARDIOVASCULAR A:  Hx of HTN. Hx of DVT >> not sure if she is still on coumadin. P:  Continue outpt meds  RENAL A:   No acute issues. P:   Monitor renal fx, urine outpt electrolytes  GASTROINTESTINAL A:   Nutrition. P:  Tube feeds protonix  HEMATOLOGIC A:   No acute issues. P:  F/u CBC SCD for DVT prevention  INFECTIOUS A:   Tracheitis vs pneumonia. P:   Vancomycin, cefepime  ENDOCRINE A:   No acute issues.   P:   SSI while on tube feeds  NEUROLOGIC A:   Anxiety. Hx of SAH complicated by stroke, seizures. P:   PRN ativan Continue outpt AED's  Updated family at bedside.  Coralyn HellingVineet Chayanne Speir, MD [facility] Pulmonary/Critical Care 03/31/2014, 10:49 AM Pager:  [phone] After 3pm call: [phone]

## 2014-03-31 NOTE — ED Notes (Signed)
Results of lactic acid and venous blood gas given to Dr. Manus Gunning, PA-C also informed.

## 2014-03-31 NOTE — Progress Notes (Addendum)
ANTIBIOTIC CONSULT NOTE - INITIAL  Pharmacy Consult for cefepime and vancomycin Indication: pneumonia  Allergies  Allergen Reactions  . Amlodipine Swelling  . Latex   . Penicillins Swelling    Patient Measurements:   Body Weight: 123.8 kg  Vital Signs: Temp: 100.7 F (38.2 C) (05/27 5465) Temp src: Axillary (05/27 0832) BP: 133/84 mmHg (05/27 0832) Intake/Output from previous day:   Intake/Output from this shift:    Labs: No results found for this basename: WBC, HGB, PLT, LABCREA, CREATININE,  in the last 72 hours The CrCl is unknown because both a height and weight (above a minimum accepted value) are required for this calculation. No results found for this basename: VANCOTROUGH, VANCOPEAK, VANCORANDOM, GENTTROUGH, GENTPEAK, GENTRANDOM, TOBRATROUGH, TOBRAPEAK, TOBRARND, AMIKACINPEAK, AMIKACINTROU, AMIKACIN,  in the last 72 hours   Microbiology: No results found for this or any previous visit (from the past 720 hour(s)).  Medical History: Past Medical History  Diagnosis Date  . Hypertension     Medications:  Vanc 1 gm and cefepime 2 gm ordered in ED     Assessment: 48 yo lady from nursing home with fever and increased work of breathing.  She has a h/o SAH and does not speak.    Goal of Therapy:  Vancomycin trough level 15-20 mcg/ml  Plan:  Will give additional 1gm vanc for total load of 2gm F/u SrCr for further dosing  Dragon Thrush Poteet Pleasant Bensinger 03/31/2014,9:14 AM  Addum:  CrCl >90 ml/min.  Will cont cefepime 1gm IV q8 hours and vancomycin 1250 mg IV q12 hours per obesity nomogram

## 2014-04-01 ENCOUNTER — Inpatient Hospital Stay (HOSPITAL_COMMUNITY): Payer: 59

## 2014-04-01 DIAGNOSIS — I671 Cerebral aneurysm, nonruptured: Secondary | ICD-10-CM

## 2014-04-01 DIAGNOSIS — I10 Essential (primary) hypertension: Secondary | ICD-10-CM

## 2014-04-01 DIAGNOSIS — F411 Generalized anxiety disorder: Secondary | ICD-10-CM

## 2014-04-01 LAB — CBC
HCT: 29.6 % — ABNORMAL LOW (ref 36.0–46.0)
HEMOGLOBIN: 9.5 g/dL — AB (ref 12.0–15.0)
MCH: 28.3 pg (ref 26.0–34.0)
MCHC: 32.1 g/dL (ref 30.0–36.0)
MCV: 88.1 fL (ref 78.0–100.0)
PLATELETS: 193 10*3/uL (ref 150–400)
RBC: 3.36 MIL/uL — ABNORMAL LOW (ref 3.87–5.11)
RDW: 16.8 % — ABNORMAL HIGH (ref 11.5–15.5)
WBC: 7.4 10*3/uL (ref 4.0–10.5)

## 2014-04-01 LAB — BASIC METABOLIC PANEL
BUN: 6 mg/dL (ref 6–23)
CO2: 23 mEq/L (ref 19–32)
Calcium: 9.1 mg/dL (ref 8.4–10.5)
Chloride: 105 mEq/L (ref 96–112)
Creatinine, Ser: 0.42 mg/dL — ABNORMAL LOW (ref 0.50–1.10)
Glucose, Bld: 114 mg/dL — ABNORMAL HIGH (ref 70–99)
Potassium: 3.7 mEq/L (ref 3.7–5.3)
Sodium: 138 mEq/L (ref 137–147)

## 2014-04-01 LAB — GLUCOSE, CAPILLARY
GLUCOSE-CAPILLARY: 106 mg/dL — AB (ref 70–99)
GLUCOSE-CAPILLARY: 122 mg/dL — AB (ref 70–99)
GLUCOSE-CAPILLARY: 104 mg/dL — AB (ref 70–99)
Glucose-Capillary: 100 mg/dL — ABNORMAL HIGH (ref 70–99)
Glucose-Capillary: 104 mg/dL — ABNORMAL HIGH (ref 70–99)
Glucose-Capillary: 112 mg/dL — ABNORMAL HIGH (ref 70–99)
Glucose-Capillary: 124 mg/dL — ABNORMAL HIGH (ref 70–99)

## 2014-04-01 LAB — URINE CULTURE
COLONY COUNT: NO GROWTH
CULTURE: NO GROWTH

## 2014-04-01 MED ORDER — BIOTENE DRY MOUTH MT LIQD
15.0000 mL | Freq: Two times a day (BID) | OROMUCOSAL | Status: DC
  Administered 2014-04-01 – 2014-04-07 (×13): 15 mL via OROMUCOSAL

## 2014-04-01 MED ORDER — PRO-STAT SUGAR FREE PO LIQD
30.0000 mL | Freq: Every day | ORAL | Status: DC
  Administered 2014-04-01: 30 mL
  Administered 2014-04-02: 16:00:00
  Administered 2014-04-03 – 2014-04-06 (×4): 30 mL
  Filled 2014-04-01 (×7): qty 30

## 2014-04-01 MED ORDER — GLUCERNA 1.2 CAL PO LIQD
1000.0000 mL | ORAL | Status: DC
  Administered 2014-04-01 – 2014-04-07 (×6): 1000 mL
  Filled 2014-04-01 (×10): qty 1000

## 2014-04-01 MED ORDER — CHLORHEXIDINE GLUCONATE 0.12 % MT SOLN
15.0000 mL | Freq: Two times a day (BID) | OROMUCOSAL | Status: DC
  Administered 2014-04-01 – 2014-04-07 (×14): 15 mL via OROMUCOSAL
  Filled 2014-04-01 (×16): qty 15

## 2014-04-01 NOTE — Progress Notes (Addendum)
Clinical Social Work Department CLINICAL SOCIAL WORK PLACEMENT NOTE 04/01/2014  Patient:  [name], [name]  Account Number:  [id number] Admit date:  03/31/2014  Clinical Social Worker:  Maryclare Labrador, Theresia Majors  Date/time:  04/01/2014 03:03 PM  Clinical Social Work is seeking post-discharge placement for this patient at the following level of care:   SKILLED NURSING   (*CSW will update this form in Epic as items are completed)   N/A-family requested sent to all SNFs in [place]. [facility] asked for by name  Patient/family provided with [facility] Department of Clinical Social Work's list of facilities offering this level of care within the geographic area requested by the patient (or if unable, by the patient's family).  04/01/2014  Patient/family informed of their freedom to choose among providers that offer the needed level of care, that participate in Medicare, Medicaid or managed care program needed by the patient, have an available bed and are willing to accept the patient.  N/A-not sent to this SNF  Patient/family informed of [facility]' ownership interest in [facility], as well as of the fact that they are under no obligation to receive care at this facility.  PASARR submitted to EDS on EXISTING PASARR number received from EDS on EXISTING  FL2 transmitted to all facilities in geographic area requested by pt/family on  04/01/2014 FL2 transmitted to all facilities within larger geographic area on   Patient informed that his/her managed care company has contracts with or will negotiate with  certain facilities, including the following:     Patient/family informed of bed offers received:  04/02/14 Patient chooses bed at [facility] Physician recommends and patient chooses bed at    Patient to be transferred to [facility] on   Patient to be transferred to facility by   The following physician  request were entered in Epic:   Additional Comments: CSW called pt's sister and explained liaison with [facility] has given a tentative yes to bed request--just need to check with facility when pt is ready to go and make sure insurance coverage is in place. CSW provided additional bed offer from [facility]. Pt's sister [name] to speak with pt's sons so they can tour facilities and decide which place pt is to be discharged. CSW following.   Maryclare Labrador, MSW, [facility] Clinical Social Worker [phone]

## 2014-04-01 NOTE — Progress Notes (Signed)
**Note De-Identified [name] Obfuscation** RT note: sputum collected Irem Stoneham tracheal sxn and sent to lab.  Patient tolerated procedure well.

## 2014-04-01 NOTE — Progress Notes (Signed)
Utilization Review Completed.  

## 2014-04-01 NOTE — H&P (Addendum)
PULMONARY / CRITICAL CARE MEDICINE   Name: [name] MRN: [id number] DOB: 12/11/1965    ADMISSION DATE:  03/31/2014  REFERRING MD :  Rancour  CHIEF COMPLAINT:  Coughing  BRIEF PATIENT DESCRIPTION: 48 yo nursing home resident with cough.  Hx of SAH complicated by seizure, Pseudomonal PNA, respiratory failure requiring tracheostomy, and tracheomalacia.  PCCM asked to admit to ICU for tracheitis vs pneumonia.  SIGNIFICANT EVENTS: 5/27 Admit, changed to #6 shiley  STUDIES:   LINES / TUBES: Trach PEG  CULTURES: MRSA PCR 5/27 >>> neg Blood 5/27 >>> Urine 5/27 >>>  ANTIBIOTICS: Vancomycin 5/27 >>> Cefepime 5/27 >>>   INTERVAL HISTORY:   No acute events overnight  VITAL SIGNS: Temp:  [98 F (36.7 C)-99.7 F (37.6 C)] 99.3 F (37.4 C) (05/28 1442) Pulse Rate:  [81-127] 93 (05/28 1604) Resp:  [20-32] 20 (05/28 1604) BP: (81-175)/(55-101) 122/74 mmHg (05/28 1604) SpO2:  [95 %-100 %] 99 % (05/28 1604) FiO2 (%):  [28 %] 28 % (05/28 1604) Weight:  [100.5 kg (221 lb 9 oz)] 100.5 kg (221 lb 9 oz) (05/28 0404) INTAKE / OUTPUT: Intake/Output     05/27 0701 - 05/28 0700 05/28 0701 - 05/29 0700   I.V. (mL/kg) 675 (6.7) 150 (1.5)   Other  100   NG/GT 315.3 120   IV Piggyback 300    Total Intake(mL/kg) 1290.3 (12.8) 370 (3.7)   Net +1290.3 +370        Urine Occurrence 4 x    Stool Occurrence 1 x 2 x     PHYSICAL EXAMINATION: General: No distress Neuro:  Awake, alert, non verbal HEENT:  Minimal secretions, tracheostomy intact Cardiovascular:  Tachycardic, no murmuir Lungs:  Bilateral air entry, scattered rhonchi Abdomen:  Soft, bowel sounds present Musculoskeletal:  No edema Skin:  No rashes  LABS:  CBC  Recent Labs Lab 03/31/14 0848 04/01/14 0308  WBC 9.3 7.4  HGB 12.0 9.5*  HCT 35.9* 29.6*  PLT 244 193   Coag's  Recent Labs Lab 03/31/14 0848  INR 1.14   BMET  Recent Labs Lab 03/31/14 0848 04/01/14 0308  NA 136* 138  K 3.9 3.7  CL 97 105   CO2 25 23  BUN 12 6  CREATININE 0.53 0.42*  GLUCOSE 111* 114*   Electrolytes  Recent Labs Lab 03/31/14 0848 04/01/14 0308  CALCIUM 9.5 9.1   Sepsis Markers  Recent Labs Lab 03/31/14 1120  LATICACIDVEN 1.79   ABG No results found for this basename: PHART, PCO2ART, PO2ART,  in the last 168 hours  Liver Enzymes  Recent Labs Lab 03/31/14 0848  AST 28  ALT 35  ALKPHOS 102  BILITOT 0.2*  ALBUMIN 3.1*   Cardiac Enzymes No results found for this basename: TROPONINI, PROBNP,  in the last 168 hours  Glucose  Recent Labs Lab 03/31/14 1313 03/31/14 1613 03/31/14 1939 04/01/14 04/01/14 0417 04/01/14 0734  GLUCAP 112* 131* 106* 100* 112* 124*   IMAGING:   Dg Chest Port 1 View  04/01/2014   CLINICAL DATA:  Check tracheostomy placement  EXAM: PORTABLE CHEST - 1 VIEW  COMPARISON:  05/31/2014  FINDINGS: Cardiac shadow is mildly enlarged but stable. Tracheostomy tube is again noted in stable position. The overall inspiratory effort is poor although no focal infiltrate or sizable effusion is seen. No acute bony abnormality is noted.  IMPRESSION: Poor inspiratory effort although no focal infiltrate is seen.   Electronically Signed   By: Alcide CleverMark  Lukens M.D.   On: 04/01/2014  08:15   Dg Chest Port 1 View  03/31/2014   CLINICAL DATA:  Fever and sepsis; tracheostomy exchange  EXAM: PORTABLE CHEST - 1 VIEW  COMPARISON:  None.  FINDINGS: The tracheostomy tip is 3.5 cm above the carina. No pneumothorax. There is atelectasis in the right base. Lungs elsewhere are clear. Heart size and pulmonary vascularity are normal. No adenopathy.  IMPRESSION: Tracheostomy as described without pneumothorax. Atelectasis right base. These changes in the right base could represent early pneumonia. Lungs elsewhere clear.   Electronically Signed   By: Bretta Bang M.D.   On: 03/31/2014 09:12   ASSESSMENT / PLAN:  PULMONARY A: Tracheitis Pneumonia less likely P:   Goal SpO2>92 Supplemental oxygen  PRN Bronchial hygiene Albuterol  CARDIOVASCULAR A:  HTN. Hx of DVT, not sure if she is still on Coumadin P:  Hydralazine, Questran, Metoprolol, Prazosin PICC  RENAL A:   No acute issues P:   Trend BMP  GASTROINTESTINAL A:   Dysphagia Nutrition GERD P:   TF Pepcid  HEMATOLOGIC A:   Mild anemia VTE Px P:  Trend CBC SCD  INFECTIOUS A:   Tracheitis vs pneumonia P:   Continue Cefepime D/c Vancomycin as MRSA PCR neg Respiratory cx  ENDOCRINE A:   No acute issues  P:   SSI while on tube feeds  NEUROLOGIC A:   Anxiety. SAH complicated by stroke, seizures P:   Vimpat, Keppra, Dilantin Trazodone  Ativan PRN  I have personally obtained history, examined patient, evaluated and interpreted laboratory and imaging results, reviewed medical records, formulated assessment / plan and placed orders.  Lonia Farber, MD Pulmonary and Critical Care Medicine [facility] Pager: [phone]  04/01/2014, 4:15 PM

## 2014-04-01 NOTE — Progress Notes (Signed)
INITIAL NUTRITION ASSESSMENT  DOCUMENTATION CODES Per approved criteria  -Obesity Unspecified   INTERVENTION: Initiate Glucerna 1.2 formula at 20 ml/hr and increase by 10 ml every 4 hours to goal rate of 50 ml/hr with Prostat liquid protein 30 ml daily to provide 1540 kcals, 87 gm protein, 966 ml of free water RD to follow for nutrition care plan  NUTRITION DIAGNOSIS: Inadequate oral intake related to inability to eat as evidenced by NPO status  Goal: Pt to meet >/= 90% of their estimated nutrition needs   Monitor:  TF regimen & tolerance, respiratory status, weight, labs, I/O's  Reason for Assessment: Consult, New Tube Feeding  48 y.o. female  Admitting Dx: tracheostomy tube change   ASSESSMENT: 48 yo Female from NH ([address] [facility] [place]) with cough. Hx of SAH complicated by seizure, pseudomonal PNA, respiratory failure requiring tracheostomy, and tracheomalacia.  CXR with possible pneumonia.  RD unable to obtain nutrition hx from patient; she is nonverbal.  Patient with tracheostomy and G-tube in place.  No muscle or subcutaneous fat depletion noticed.  Spoke with RN from [facility]; patient receives bolus TF regimen of Glucerna 1.2 formula 1 can QID (1140 kcals, 57 gm protein).  RD consulted for TF initiation & management via Adult Tube Feeding Protocol.  Height: Ht Readings from Last 1 Encounters:  08/17/13 5' 5.75" (1.67 m)    Weight: Wt Readings from Last 1 Encounters:  04/01/14 221 lb 9 oz (100.5 kg)    Ideal Body Weight: 125 lb  % Ideal Body Weight: 176%  Wt Readings from Last 10 Encounters:  04/01/14 221 lb 9 oz (100.5 kg)  09/18/13 273 lb (123.832 kg)  09/07/13 272 lb 12 oz (123.719 kg)  08/17/13 268 lb 12 oz (121.904 kg)  06/02/13 270 lb (122.471 kg)  03/04/13 276 lb (125.193 kg)  02/10/13 279 lb 8 oz (126.78 kg)  12/09/12 280 lb (127.007 kg)  09/29/12 267 lb (121.11 kg)  08/20/12 263 lb 4 oz (119.409 kg)    Usual Body  Weight: 273 lb  % Usual Body Weight: 81%  BMI:  Body mass index is 36.04 kg/(m^2).  Estimated Nutritional Needs: Kcal: 1500-1700 Protein: 85-95 gm Fluid: 1.5-1.7 L  Skin: Stage I pressure ulcer to sacrum, Stage II pressure ulcer to buttocks  Diet Order: NPO  EDUCATION NEEDS: -No education needs identified at this time   Intake/Output Summary (Last 24 hours) at 04/01/14 1102 Last data filed at 04/01/14 1039  Gross per 24 hour  Intake 1660.34 ml  Output      0 ml  Net 1660.34 ml    Labs:   Recent Labs Lab 03/31/14 0848 04/01/14 0308  NA 136* 138  K 3.9 3.7  CL 97 105  CO2 25 23  BUN 12 6  CREATININE 0.53 0.42*  CALCIUM 9.5 9.1  GLUCOSE 111* 114*    CBG (last 3)   Recent Labs  04/01/14 04/01/14 0417 04/01/14 0734  GLUCAP 100* 112* 124*    Scheduled Meds: . albuterol  2.5 mg Nebulization Q6H  . antiseptic oral rinse  15 mL Mouth Rinse q12n4p  . ceFEPime (MAXIPIME) IV  1 g Intravenous Q8H  . chlorhexidine  15 mL Mouth Rinse BID  . cholestyramine  4 g Per Tube TID  . famotidine  20 mg Per Tube Q12H  . hydrALAZINE  50 mg Per Tube BID  . insulin aspart  0-15 Units Subcutaneous 6 times per day  . lacosamide  100 mg Per  Tube BID  . levETIRAcetam  1,500 mg Oral BID  . metoprolol tartrate  75 mg Per Tube BID  . phenytoin  200 mg Per Tube Q12H  . prazosin  2 mg Per Tube QHS  . traZODone  100 mg Per Tube QHS  . vancomycin  1,250 mg Intravenous Q12H    Continuous Infusions: . sodium chloride 1,000 mL (03/31/14 1030)  . feeding supplement (JEVITY 1.2 CAL) 1,000 mL (04/01/14 [id number])    Past Medical History  Diagnosis Date  . Hypertension   . SAH (subarachnoid hemorrhage)   . Seizure   . Stroke   . Tracheostomy status   . Tracheomalacia   . DVT (deep venous thrombosis)     Past Surgical History  Procedure Laterality Date  . Abdominal hysterectomy    . Cholecystectomy      Maureen ChattersKatie Raziel Koenigs, RD, LDN Pager #: [phone] After-Hours Pager #:  [phone]

## 2014-04-01 NOTE — Progress Notes (Signed)
Family hopeful [facility] can offer a bed at discharge. Pt's sister [name] states placement will be long-term (6+ months). [name] states [place] application completed last week. CSW spoke with rep with [facility], and rep to assess pt tomorrow morning. CSW following and will update [name] if [facility] can offer a bed.   Maryclare Labrador, MSW, [facility] Clinical Social Worker [phone]

## 2014-04-01 NOTE — Progress Notes (Signed)
Clinical Social Work Department BRIEF PSYCHOSOCIAL ASSESSMENT 04/01/2014  Patient:  [name], [name]     Account Number:  [id number]     Admit date:  03/31/2014  Clinical Social Worker:  Varney Biles  Date/Time:  04/01/2014 02:52 PM  Referred by:  Physician  Date Referred:  04/01/2014 Referred for  SNF Placement   Other Referral:   Interview type:  Family Other interview type:    PSYCHOSOCIAL DATA Living Status:  FACILITY Admitted from facility:  [facility], [place] Level of care:  Skilled Nursing Facility Primary support name:  Rosabel Wallenberg [phone]) Primary support relationship to patient:  SIBLING Degree of support available:   Good--pt has support from son and sister.    CURRENT CONCERNS Current Concerns  Post-Acute Placement   Other Concerns:    SOCIAL WORK ASSESSMENT / PLAN CSW called pt's sister to verify pt is from [facility]. Sister confirmed but states they want pt to go to a different facility at discharge. Sister mentioned [facility] and [facility]. Explained to sister that [facility] typically does not take trach patients, but [facility] does. CSW explained facilities will need to check the insurance as well; pt's sister understanding of these requirements and potential barriers.   Assessment/plan status:  Psychosocial Support/Ongoing Assessment of Needs Other assessment/ plan:   Information/referral to community resources:   SNF    PATIENT'S/FAMILY'S RESPONSE TO PLAN OF CARE: Good--pt's sister thanked CSW for assistance and is hopeful for a different placement at discharge. CSW following and will continue to provide support and help with this.       Maryclare Labrador, MSW, [facility] Clinical Social Worker [phone]

## 2014-04-02 LAB — GLUCOSE, CAPILLARY
GLUCOSE-CAPILLARY: 102 mg/dL — AB (ref 70–99)
Glucose-Capillary: 105 mg/dL — ABNORMAL HIGH (ref 70–99)
Glucose-Capillary: 106 mg/dL — ABNORMAL HIGH (ref 70–99)
Glucose-Capillary: 107 mg/dL — ABNORMAL HIGH (ref 70–99)
Glucose-Capillary: 111 mg/dL — ABNORMAL HIGH (ref 70–99)
Glucose-Capillary: 112 mg/dL — ABNORMAL HIGH (ref 70–99)

## 2014-04-02 LAB — PROCALCITONIN

## 2014-04-02 LAB — CLOSTRIDIUM DIFFICILE BY PCR: Toxigenic C. Difficile by PCR: NEGATIVE

## 2014-04-02 MED ORDER — SODIUM CHLORIDE 0.9 % IJ SOLN
10.0000 mL | Freq: Two times a day (BID) | INTRAMUSCULAR | Status: DC
  Administered 2014-04-02 – 2014-04-05 (×5): 10 mL

## 2014-04-02 MED ORDER — SODIUM CHLORIDE 0.9 % IJ SOLN
10.0000 mL | INTRAMUSCULAR | Status: DC | PRN
  Administered 2014-04-07: 10 mL

## 2014-04-02 NOTE — Progress Notes (Signed)
Trach Team Patient Details Name: [name] MRN: [id number] DOB: 1966-03-06 Today's Date: 04/02/2014 Time:  -     Please order Passy-Muir Speaking valve eval if pt is appropriate  Royce Macadamia M.Ed [employer] [phone]  04/02/2014

## 2014-04-02 NOTE — Progress Notes (Signed)
PULMONARY / CRITICAL CARE MEDICINE   Name: Donald PoreLaura Marie Garrels MRN: [id number] DOB: 09/13/1966    ADMISSION DATE:  03/31/2014  REFERRING MD :  Rancour  CHIEF COMPLAINT:  Coughing  BRIEF PATIENT DESCRIPTION: 48 yo nursing home resident with cough.  Hx of SAH complicated by seizure, Pseudomonal PNA, respiratory failure requiring tracheostomy, and tracheomalacia.  PCCM asked to admit to ICU for tracheitis vs pneumonia.  SIGNIFICANT EVENTS: 5/27 Admit, changed to #6 shiley  STUDIES:   LINES / TUBES: Trach PEG R PICC 5/28 >>>  CULTURES: MRSA PCR 5/27 >>> neg Blood 5/27 >>> Urine 5/27 >>> neg cdif 5/29 >>> neg  ANTIBIOTICS: Vancomycin 5/27 >>> 5/28 Cefepime 5/27 >>>   INTERVAL HISTORY:   Febrile.  Cx negative.  PICC paced.  VITAL SIGNS: Temp:  [99.6 F (37.6 C)-101.7 F (38.7 C)] 100.6 F (38.1 C) (05/29 1555) Pulse Rate:  [85-109] 94 (05/29 1800) Resp:  [10-36] 16 (05/29 1631) BP: (84-172)/(45-97) 172/97 mmHg (05/29 1800) SpO2:  [97 %-100 %] 100 % (05/29 1800) FiO2 (%):  [28 %] 28 % (05/29 1631) Weight:  [102.1 kg (225 lb 1.4 oz)] 102.1 kg (225 lb 1.4 oz) (05/29 0400)  INTAKE / OUTPUT: Intake/Output     05/29 0701 - 05/30 0700   I.V. (mL/kg) 500 (4.9)   Other    NG/GT 500   IV Piggyback 100   Total Intake(mL/kg) 1100 (10.8)   Net +1100       Urine Occurrence 2 x     PHYSICAL EXAMINATION: General: No distress Neuro:  Non verbal, tracking with eyes  HEENT:  Tracheostomy site clean Cardiovascular:  Tachycardic, no murmuir Lungs:  Bilateral scattered rhonchi Abdomen:  Soft, bowel sounds present Musculoskeletal:  No edema Skin:  No rashe  LABS:  CBC  Recent Labs Lab 03/31/14 0848 04/01/14 0308  WBC 9.3 7.4  HGB 12.0 9.5*  HCT 35.9* 29.6*  PLT 244 193   Coag's  Recent Labs Lab 03/31/14 0848  INR 1.14   BMET  Recent Labs Lab 03/31/14 0848 04/01/14 0308  NA 136* 138  K 3.9 3.7  CL 97 105  CO2 25 23  BUN 12 6  CREATININE 0.53 0.42*   GLUCOSE 111* 114*   Electrolytes  Recent Labs Lab 03/31/14 0848 04/01/14 0308  CALCIUM 9.5 9.1   Sepsis Markers  Recent Labs Lab 03/31/14 1120  LATICACIDVEN 1.79   ABG No results found for this basename: PHART, PCO2ART, PO2ART,  in the last 168 hours  Liver Enzymes  Recent Labs Lab 03/31/14 0848  AST 28  ALT 35  ALKPHOS 102  BILITOT 0.2*  ALBUMIN 3.1*   Cardiac Enzymes No results found for this basename: TROPONINI, PROBNP,  in the last 168 hours  Glucose  Recent Labs Lab 04/01/14 1959 04/02/14 0010 04/02/14 0501 04/02/14 0854 04/02/14 1216 04/02/14 1555  GLUCAP 104* 111* 105* 112* 102* 106*   IMAGING:   Dg Chest Port 1 View  04/01/2014   CLINICAL DATA:  Check tracheostomy placement  EXAM: PORTABLE CHEST - 1 VIEW  COMPARISON:  05/31/2014  FINDINGS: Cardiac shadow is mildly enlarged but stable. Tracheostomy tube is again noted in stable position. The overall inspiratory effort is poor although no focal infiltrate or sizable effusion is seen. No acute bony abnormality is noted.  IMPRESSION: Poor inspiratory effort although no focal infiltrate is seen.   Electronically Signed   By: Alcide CleverMark  Lukens M.D.   On: 04/01/2014 08:15   ASSESSMENT / PLAN:  PULMONARY A:  Tracheitis Pneumonia less likely P:   Goal SpO2>92 Supplemental oxygen PRN Bronchial hygiene Albuterol  CARDIOVASCULAR A:  HTN. Hx of DVT, not sure if she is still on Coumadin P:  Hydralazine, Questran, Metoprolol, Prazosin PICC  RENAL A:   No acute issues P:   Trend BMP  GASTROINTESTINAL A:   Dysphagia Nutrition GERD P:   TF Pepcid  HEMATOLOGIC A:   Mild anemia VTE Px P:  Trend CBC SCD  INFECTIOUS A:   Tracheitis vs pneumonia P:   Continue Cefepime Follow respiratory cx PCT  ENDOCRINE A:   No acute issues  P:   SSI while on tube feeds  NEUROLOGIC A:   Anxiety SAH complicated by stroke, seizures P:   Vimpat, Keppra, Dilantin Trazodone  Ativan PRN  I  have personally obtained history, examined patient, evaluated and interpreted laboratory and imaging results, reviewed medical records, formulated assessment / plan and placed orders.  Lonia Farber, MD Pulmonary and Critical Care Medicine [facility] Pager: [phone]  04/02/2014, 7:08 PM

## 2014-04-02 NOTE — Progress Notes (Signed)
Peripherally Inserted Central Catheter/Midline Placement  The IV Nurse has discussed with the patient and/or persons authorized to consent for the patient, the purpose of this procedure and the potential benefits and risks involved with this procedure.  The benefits include less needle sticks, lab draws from the catheter and patient may be discharged home with the catheter.  Risks include, but not limited to, infection, bleeding, blood clot (thrombus formation), and puncture of an artery; nerve damage and irregular heat beat.  Alternatives to this procedure were also discussed.  PICC/Midline Placement Documentation        [name] 04/02/2014, 11:31 AM

## 2014-04-02 NOTE — Progress Notes (Signed)
ANTIBIOTIC CONSULT NOTE - FOLLOW UP  Pharmacy Consult for Cefepime Indication: pneumonia  Allergies  Allergen Reactions  . Amlodipine Swelling  . Latex Other (See Comments)    Unknown   . Other Other (See Comments)    Natural Rubber- Unknown   . Penicillins Swelling  Patient Measurements: Height: 5\' 8"  (172.7 cm) Weight: 225 lb 1.4 oz (102.1 kg) IBW/kg (Calculated) : 63.9 Vital Signs: Temp: 101.7 F (38.7 C) (05/29 1217) Temp src: Axillary (05/29 1217) BP: 153/90 mmHg (05/29 1301) Pulse Rate: 87 (05/29 1301) Intake/Output from previous day: 05/28 0701 - 05/29 0700 In: 1720 [I.V.:800; NG/GT:770; IV Piggyback:50] Out: -  Intake/Output from this shift: Total I/O In: 550 [I.V.:250; NG/GT:250; IV Piggyback:50] Out: -  Labs:  Recent Labs  03/31/14 0848 04/01/14 0308  WBC 9.3 7.4  HGB 12.0 9.5*  PLT 244 193  CREATININE 0.53 0.42*   Estimated Creatinine Clearance: 109.9 ml/min (by C-G formula based on Cr of 0.42).  Microbiology: 5/29 Cdiff >> negative 5/27 Urine >> negative 5/27 Blood >>ngtd  5/27 Sputum >> 5/27 MRSA PCR negative   Anti-infectives   Start     Dose/Rate Route Frequency Ordered Stop   03/31/14 1900  vancomycin (VANCOCIN) 1,250 mg in sodium chloride 0.9 % 250 mL IVPB  Status:  Discontinued     1,250 mg 166.7 mL/hr over 90 Minutes Intravenous Every 12 hours 03/31/14 1054 04/01/14 1617   03/31/14 1700  ceFEPIme (MAXIPIME) 1 g in dextrose 5 % 50 mL IVPB     1 g 100 mL/hr over 30 Minutes Intravenous Every 8 hours 03/31/14 1054     03/31/14 0930  vancomycin (VANCOCIN) IVPB 1000 mg/200 mL premix     1,000 mg 200 mL/hr over 60 Minutes Intravenous  Once 03/31/14 0918 03/31/14 1325   03/31/14 0900  ceFEPIme (MAXIPIME) 2 g in dextrose 5 % 50 mL IVPB     2 g 100 mL/hr over 30 Minutes Intravenous  Once 03/31/14 0848 03/31/14 1019   03/31/14 0900  vancomycin (VANCOCIN) IVPB 1000 mg/200 mL premix     1,000 mg 200 mL/hr over 60 Minutes Intravenous  Once  03/31/14 0848 03/31/14 1019      Assessment: 47 YOF with history of pseudomonal PNA and chronic trach admitted with rule out HCAP vs tracheitis on cefepime. Vancomycin has been discontinued. Note that patient has PCN allergy listed as swelling. Patient is tolerating cefepime. WBC is within normal limits. Tmax is 101.7- should not forget the possibility of drug fever.   SCr is stable with estimated CrCl >100 ml/min.   Goal of Therapy:  Clinical resolution of infection  Plan:  Continue Cefepime 1g IV q8h Monitor for any signs or symptoms of allergic reaction Followup culture data,clinical response, and renal function.   Link Snuffer, PharmD, BCPS Clinical Pharmacist [phone] 04/02/2014,1:19 PM

## 2014-04-03 LAB — CBC
HEMATOCRIT: 29.8 % — AB (ref 36.0–46.0)
Hemoglobin: 9.7 g/dL — ABNORMAL LOW (ref 12.0–15.0)
MCH: 28.4 pg (ref 26.0–34.0)
MCHC: 32.6 g/dL (ref 30.0–36.0)
MCV: 87.1 fL (ref 78.0–100.0)
Platelets: 235 10*3/uL (ref 150–400)
RBC: 3.42 MIL/uL — ABNORMAL LOW (ref 3.87–5.11)
RDW: 16.9 % — ABNORMAL HIGH (ref 11.5–15.5)
WBC: 6.5 10*3/uL (ref 4.0–10.5)

## 2014-04-03 LAB — GLUCOSE, CAPILLARY
GLUCOSE-CAPILLARY: 97 mg/dL (ref 70–99)
GLUCOSE-CAPILLARY: 106 mg/dL — AB (ref 70–99)
GLUCOSE-CAPILLARY: 109 mg/dL — AB (ref 70–99)
Glucose-Capillary: 106 mg/dL — ABNORMAL HIGH (ref 70–99)
Glucose-Capillary: 114 mg/dL — ABNORMAL HIGH (ref 70–99)
Glucose-Capillary: 114 mg/dL — ABNORMAL HIGH (ref 70–99)

## 2014-04-03 LAB — BASIC METABOLIC PANEL
BUN: 6 mg/dL (ref 6–23)
CO2: 24 mEq/L (ref 19–32)
Calcium: 9.3 mg/dL (ref 8.4–10.5)
Chloride: 112 mEq/L (ref 96–112)
Creatinine, Ser: 0.43 mg/dL — ABNORMAL LOW (ref 0.50–1.10)
Glucose, Bld: 103 mg/dL — ABNORMAL HIGH (ref 70–99)
POTASSIUM: 3.9 meq/L (ref 3.7–5.3)
Sodium: 146 mEq/L (ref 137–147)

## 2014-04-03 LAB — PROCALCITONIN

## 2014-04-03 MED ORDER — MINOXIDIL 2.5 MG PO TABS
2.5000 mg | ORAL_TABLET | Freq: Two times a day (BID) | ORAL | Status: DC
  Administered 2014-04-03: 2.5 mg
  Filled 2014-04-03 (×2): qty 1

## 2014-04-03 MED ORDER — MINOXIDIL 2.5 MG PO TABS
5.0000 mg | ORAL_TABLET | Freq: Two times a day (BID) | ORAL | Status: DC
  Administered 2014-04-03 – 2014-04-04 (×2): 5 mg
  Filled 2014-04-03 (×3): qty 2

## 2014-04-03 MED ORDER — FREE WATER
200.0000 mL | Freq: Four times a day (QID) | Status: DC
  Administered 2014-04-03 – 2014-04-07 (×17): 200 mL

## 2014-04-03 MED ORDER — METOPROLOL TARTRATE 100 MG PO TABS
100.0000 mg | ORAL_TABLET | Freq: Two times a day (BID) | ORAL | Status: DC
  Administered 2014-04-03 – 2014-04-07 (×8): 100 mg
  Filled 2014-04-03 (×9): qty 1

## 2014-04-03 MED ORDER — CLONIDINE HCL 0.2 MG PO TABS
0.2000 mg | ORAL_TABLET | Freq: Three times a day (TID) | ORAL | Status: DC
  Administered 2014-04-03 – 2014-04-07 (×12): 0.2 mg via ORAL
  Filled 2014-04-03 (×16): qty 1

## 2014-04-03 NOTE — Progress Notes (Signed)
Name: Genie Moorhouse MRN: [id number] DOB: October 26, 1966  ELECTRONIC ICU PHYSICIAN NOTE  Problem:  Hbp/ tachycardic on lopressor 100 bid  Intervention:  Added clonidine and increased minoxidil to 5 mg bid   Nyoka Cowden 04/03/2014, 5:19 PM

## 2014-04-03 NOTE — Progress Notes (Addendum)
PULMONARY / CRITICAL CARE MEDICINE   Name: [name] MRN: [id number] DOB: 1966/09/23    ADMISSION DATE:  03/31/2014  REFERRING MD :  Rancour  CHIEF COMPLAINT:  Coughing  BRIEF PATIENT DESCRIPTION: 48 yo nursing home resident with cough.  Hx of SAH complicated by seizure, Pseudomonal PNA, respiratory failure requiring tracheostomy, and tracheomalacia.  PCCM asked to admit to ICU for tracheitis vs pneumonia.  SIGNIFICANT EVENTS: 5/27 Admit, changed to #6 shiley  STUDIES:   LINES / TUBES: Trach PEG R PICC 5/28 >>>  CULTURES: MRSA PCR 5/27 >>> neg Blood 5/27 >>> Urine 5/27 >>> neg cdif 5/29 >>> neg  ANTIBIOTICS: Vancomycin 5/27 >>> 5/28 Cefepime 5/27 >>>   Subjective/INTERVAL HISTORY:   No specific complaints per nursing   VITAL SIGNS: Temp:  [98.7 F (37.1 C)-101.7 F (38.7 C)] 100.4 F (38 C) (05/30 0812) Pulse Rate:  [81-96] 90 (05/30 0836) Resp:  [10-36] 12 (05/30 0836) BP: (137-172)/(81-107) 165/105 mmHg (05/30 0914) SpO2:  [97 %-100 %] 100 % (05/30 0836) FiO2 (%):  [28 %] 28 % (05/30 0836) Weight:  [227 lb 4.7 oz (103.1 kg)] 227 lb 4.7 oz (103.1 kg) (05/30 0400)  INTAKE / OUTPUT: Intake/Output     05/29 0701 - 05/30 0700 05/30 0701 - 05/31 0700   I.V. (mL/kg) 1150 (11.2)    Other     NG/GT 1150    IV Piggyback 150    Total Intake(mL/kg) 2450 (23.8)    Net +2450          Urine Occurrence 5 x      PHYSICAL EXAMINATION: General: No distress Neuro:  Non verbal, tracking with eyes  HEENT:  Tracheostomy site clean Cardiovascular:  Tachycardic, no murmuir Lungs:  Bilateral scattered rhonchi Abdomen:  Soft, bowel sounds present Musculoskeletal:  No edema Skin:  No rash  LABS:  CBC  Recent Labs Lab 03/31/14 0848 04/01/14 0308 04/03/14 0630  WBC 9.3 7.4 6.5  HGB 12.0 9.5* 9.7*  HCT 35.9* 29.6* 29.8*  PLT 244 193 235   Coag's  Recent Labs Lab 03/31/14 0848  INR 1.14   BMET  Recent Labs Lab 03/31/14 0848 04/01/14 0308  04/03/14 0630  NA 136* 138 146  K 3.9 3.7 3.9  CL 97 105 112  CO2 25 23 24   BUN 12 6 6   CREATININE 0.53 0.42* 0.43*  GLUCOSE 111* 114* 103*   Electrolytes  Recent Labs Lab 03/31/14 0848 04/01/14 0308 04/03/14 0630  CALCIUM 9.5 9.1 9.3   Sepsis Markers  Recent Labs Lab 03/31/14 1120 04/02/14 2024 04/03/14 0630  LATICACIDVEN 1.79  --   --   PROCALCITON  --  <0.10 <0.10   ABG No results found for this basename: PHART, PCO2ART, PO2ART,  in the last 168 hours  Liver Enzymes  Recent Labs Lab 03/31/14 0848  AST 28  ALT 35  ALKPHOS 102  BILITOT 0.2*  ALBUMIN 3.1*   Cardiac Enzymes No results found for this basename: TROPONINI, PROBNP,  in the last 168 hours  Glucose  Recent Labs Lab 04/02/14 1216 04/02/14 1555 04/02/14 1948 04/02/14 2353 04/03/14 0431 04/03/14 0811  GLUCAP 102* 106* 107* 114* 97 114*   IMAGING:   No results found. ASSESSMENT / PLAN:  PULMONARY A: Tracheitis Pneumonia less likely P:   Goal SpO2>92 Supplemental oxygen PRN Bronchial hygiene Albuterol  CARDIOVASCULAR A:  HTN. Hx of DVT, not sure if she is still on Coumadin P:  Maint rx = Hydralazine, Questran, Metoprolol, Prazosin > ppor  bp control so d/c'd hydralazine and prazosin as redundant 5/30 and replaced with minoxidil    RENAL A:   Mild hypernatremia 5/30 on NS  P:   Change to NS lock also hypertensive and add water   GASTROINTESTINAL A:   Dysphagia Nutrition GERD P:   TF Pepcid  HEMATOLOGIC A:   Mild anemia VTE Px  Recent Labs Lab 03/31/14 0848 04/01/14 0308 04/03/14 0630  HGB 12.0 9.5* 9.7*    P:  Trend CBC SCD  INFECTIOUS A:   Tracheitis vs pneumonia (favor latter) -PCT < 0.10 5/30  P:   Continue Cefepime Follow respiratory cx   ENDOCRINE A:   No acute issues  P:   SSI while on tube feeds   NEUROLOGIC A:   Anxiety SAH complicated by stroke, seizures P:   Vimpat, Keppra, Dilantin Trazodone  Ativan PRN    Meghan HughsMichael  [name], MD Pulmonary and Critical Care Medicine [facility] [phone] After 5:30 PM or weekends, call [phone]

## 2014-04-04 LAB — GLUCOSE, CAPILLARY
GLUCOSE-CAPILLARY: 121 mg/dL — AB (ref 70–99)
GLUCOSE-CAPILLARY: 105 mg/dL — AB (ref 70–99)
Glucose-Capillary: 106 mg/dL — ABNORMAL HIGH (ref 70–99)
Glucose-Capillary: 120 mg/dL — ABNORMAL HIGH (ref 70–99)
Glucose-Capillary: 128 mg/dL — ABNORMAL HIGH (ref 70–99)
Glucose-Capillary: 115 mg/dL — ABNORMAL HIGH (ref 70–99)
Glucose-Capillary: 123 mg/dL — ABNORMAL HIGH (ref 70–99)

## 2014-04-04 LAB — CULTURE, RESPIRATORY: Special Requests: NORMAL

## 2014-04-04 LAB — CULTURE, RESPIRATORY W GRAM STAIN: Culture: NO GROWTH

## 2014-04-04 LAB — PROCALCITONIN: Procalcitonin: <0.1 ng/mL

## 2014-04-04 MED ORDER — MINOXIDIL 10 MG PO TABS
10.0000 mg | ORAL_TABLET | Freq: Two times a day (BID) | ORAL | Status: DC
  Administered 2014-04-04 – 2014-04-07 (×6): 10 mg
  Filled 2014-04-04 (×7): qty 1

## 2014-04-04 NOTE — Progress Notes (Signed)
PULMONARY / CRITICAL CARE MEDICINE   Name: [name] MRN: [id number] DOB: [birth year]    ADMISSION DATE:  03/31/2014  REFERRING MD :  Rancour  CHIEF COMPLAINT:  Coughing  BRIEF PATIENT DESCRIPTION: 48 yo nursing home resident with cough.  Hx of SAH complicated by seizure, Pseudomonal PNA, respiratory failure requiring tracheostomy, and tracheomalacia.  PCCM asked to admit to ICU for tracheitis vs pneumonia.   SIGNIFICANT EVENTS: 5/27 Admit, changed to #6 shiley  STUDIES:   LINES / TUBES: Trach PEG R PICC 5/28 >>>  CULTURES: MRSA PCR 5/27 >>> neg Blood 5/27 >>> Urine 5/27 >>> neg Sputum 5/28 > abundant wbc, no org seen > neg cdif 5/29 >>> neg  ANTIBIOTICS: Vancomycin 5/27 >>> 5/28 Cefepime 5/27 >>>   Subjective/INTERVAL HISTORY:   No specific complaints per nursing - blank stare and won't follow commands for me  VITAL SIGNS: Temp:  [98.1 F (36.7 C)-100 F (37.8 C)] 99 F (37.2 C) (05/31 0919) Pulse Rate:  [72-102] 79 (05/31 0919) Resp:  [12-34] 34 (05/31 0919) BP: (113-199)/(42-136) 153/95 mmHg (05/31 0850) SpO2:  [96 %-100 %] 99 % (05/31 0919) FiO2 (%):  [28 %] 28 % (05/31 0919) Weight:  [224 lb 13.9 oz (102 kg)] 224 lb 13.9 oz (102 kg) (05/31 0509)  INTAKE / OUTPUT: Intake/Output     05/30 0701 - 05/31 0700 05/31 0701 - 06/01 0700   I.V. (mL/kg)     NG/GT 2000    IV Piggyback 150    Total Intake(mL/kg) 2150 (21.1)    Net +2150          Urine Occurrence 6 x    Stool Occurrence 1 x      PHYSICAL EXAMINATION: General: No distress Neuro:  Non verbal, tracking with eyes  HEENT:  Tracheostomy site clean Cardiovascular:  Tachycardic, no murmuir Lungs:  Bilateral scattered rhonchi Abdomen:  Soft, bowel sounds present Musculoskeletal:  No edema Skin:  No rash  LABS:  CBC  Recent Labs Lab 03/31/14 0848 04/01/14 0308 04/03/14 0630  WBC 9.3 7.4 6.5  HGB 12.0 9.5* 9.7*  HCT 35.9* 29.6* 29.8*  PLT 244 193 235   Coag's  Recent Labs Lab  03/31/14 0848  INR 1.14   BMET  Recent Labs Lab 03/31/14 0848 04/01/14 0308 04/03/14 0630  NA 136* 138 146  K 3.9 3.7 3.9  CL 97 105 112  CO2 25 23 24   BUN 12 6 6   CREATININE 0.53 0.42* 0.43*  GLUCOSE 111* 114* 103*   Electrolytes  Recent Labs Lab 03/31/14 0848 04/01/14 0308 04/03/14 0630  CALCIUM 9.5 9.1 9.3   Sepsis Markers  Recent Labs Lab 03/31/14 1120 04/02/14 2024 04/03/14 0630 04/04/14 0552  LATICACIDVEN 1.79  --   --   --   PROCALCITON  --  <0.10 <0.10 <0.10   ABG No results found for this basename: PHART, PCO2ART, PO2ART,  in the last 168 hours  Liver Enzymes  Recent Labs Lab 03/31/14 0848  AST 28  ALT 35  ALKPHOS 102  BILITOT 0.2*  ALBUMIN 3.1*   Cardiac Enzymes No results found for this basename: TROPONINI, PROBNP,  in the last 168 hours  Glucose  Recent Labs Lab 04/02/14 2353 04/03/14 0431 04/03/14 0811 04/03/14 1237 04/03/14 1646 04/03/14 2008  GLUCAP 114* 97 114* 106* 106* 109*   IMAGING:   No results found. ASSESSMENT / PLAN:  PULMONARY A: Tracheitis Pneumonia less likely P:   Goal SpO2>92 Supplemental oxygen PRN Bronchial hygiene Albuterol  CARDIOVASCULAR A:  HTN. Hx of DVT, not sure if she is still on Coumadin P:  Maint rx = Hydralazine, Questran, Metoprolol, Prazosin > poor bp control so d/c'd hydralazine and prazosin as redundant 5/30 and replaced with minoxidil/ added clonidine  - improved 5/31 but still over target > increased minoxidil to 10 bid    RENAL A:   Mild hypernatremia 5/30 on NS  P:   Change to NS lock - also   added water 5/30    GASTROINTESTINAL A:   Dysphagia Nutrition GERD P:   TF Pepcid  HEMATOLOGIC A:   Mild anemia VTE Px  Recent Labs Lab 03/31/14 0848 04/01/14 0308 04/03/14 0630  HGB 12.0 9.5* 9.7*    P:  Trend CBC SCD  INFECTIOUS A:   Tracheitis vs pneumonia (favor latter) -PCT < 0.10 5/30  P:   Per dashboard ? D/c all abx 6/1 ?     ENDOCRINE A:    No acute issues  P:   SSI while on tube feeds   NEUROLOGIC A:   Anxiety SAH complicated by stroke, seizures P:   Vimpat, Keppra, Dilantin Trazodone  Ativan PRN    [name] Cyera Balboni, MD Pulmonary and Critical Care Medicine [facility] Cell [phone] After 5:30 PM or weekends, call [phone]

## 2014-04-05 ENCOUNTER — Inpatient Hospital Stay (HOSPITAL_COMMUNITY): Payer: 59

## 2014-04-05 DIAGNOSIS — R609 Edema, unspecified: Secondary | ICD-10-CM

## 2014-04-05 DIAGNOSIS — R0789 Other chest pain: Secondary | ICD-10-CM

## 2014-04-05 DIAGNOSIS — I82409 Acute embolism and thrombosis of unspecified deep veins of unspecified lower extremity: Secondary | ICD-10-CM

## 2014-04-05 DIAGNOSIS — R51 Headache: Secondary | ICD-10-CM

## 2014-04-05 LAB — CBC
HCT: 32.2 % — ABNORMAL LOW (ref 36.0–46.0)
Hemoglobin: 10.5 g/dL — ABNORMAL LOW (ref 12.0–15.0)
MCH: 28.3 pg (ref 26.0–34.0)
MCHC: 32.6 g/dL (ref 30.0–36.0)
MCV: 86.8 fL (ref 78.0–100.0)
Platelets: 219 10*3/uL (ref 150–400)
RBC: 3.71 MIL/uL — AB (ref 3.87–5.11)
RDW: 16.3 % — ABNORMAL HIGH (ref 11.5–15.5)
WBC: 7 10*3/uL (ref 4.0–10.5)

## 2014-04-05 LAB — URINALYSIS, ROUTINE W REFLEX MICROSCOPIC
BILIRUBIN URINE: NEGATIVE
Glucose, UA: NEGATIVE mg/dL
Hgb urine dipstick: NEGATIVE
Ketones, ur: NEGATIVE mg/dL
Leukocytes, UA: NEGATIVE
Nitrite: NEGATIVE
PH: 5.5 (ref 5.0–8.0)
Protein, ur: NEGATIVE mg/dL
Specific Gravity, Urine: 1.019 (ref 1.005–1.030)
Urobilinogen, UA: 0.2 mg/dL (ref 0.0–1.0)

## 2014-04-05 LAB — GLUCOSE, CAPILLARY
Glucose-Capillary: 107 mg/dL — ABNORMAL HIGH (ref 70–99)
Glucose-Capillary: 110 mg/dL — ABNORMAL HIGH (ref 70–99)
Glucose-Capillary: 114 mg/dL — ABNORMAL HIGH (ref 70–99)
Glucose-Capillary: 119 mg/dL — ABNORMAL HIGH (ref 70–99)
Glucose-Capillary: 121 mg/dL — ABNORMAL HIGH (ref 70–99)
Glucose-Capillary: 132 mg/dL — ABNORMAL HIGH (ref 70–99)

## 2014-04-05 NOTE — Progress Notes (Signed)
CSW contacted [employer] rep to inquire about ability to offer a bed--waiting for response. CSW to follow up with pt's family for SNF placement when pt is medically ready for transition.    Maryclare Labrador, MSW, [facility] Clinical Social Worker [phone]

## 2014-04-05 NOTE — Progress Notes (Signed)
PULMONARY / CRITICAL CARE MEDICINE   Name: Meghan PoreLaura Marie Lawson MRN: [id number] DOB: 10/10/1966    ADMISSION DATE:  03/31/2014  REFERRING MD :  Rancour  CHIEF COMPLAINT:  Coughing  BRIEF PATIENT DESCRIPTION: 48 yo nursing home resident with cough.  Hx of SAH complicated by seizure, Pseudomonal PNA, respiratory failure requiring tracheostomy, and tracheomalacia.  PCCM asked to admit to ICU for tracheitis vs pneumonia.  SIGNIFICANT EVENTS: 5/27 Admit, changed to #6 shiley  STUDIES:   LINES / TUBES: Trach PEG R PICC 5/28 >>>  CULTURES: MRSA PCR 5/27 >>> neg Blood 5/27 >>> Urine 5/27 >>> neg Sputum 5/28 > abundant wbc, no org seen > neg cdif 5/29 >>> neg  ANTIBIOTICS: Vancomycin 5/27 >>> 5/28 Cefepime 5/27 >>>   Subjective/INTERVAL HISTORY:   Low grade fever overnight.   VITAL SIGNS: Temp:  [97 F (36.1 C)-100.5 F (38.1 C)] 100.5 F (38.1 C) (06/01 1230) Pulse Rate:  [77-117] 94 (06/01 1230) Resp:  [12-36] 32 (06/01 1230) BP: (117-159)/(72-100) 119/81 mmHg (06/01 1230) SpO2:  [95 %-100 %] 99 % (06/01 1230) FiO2 (%):  [28 %] 28 % (06/01 1230) Weight:  [102.5 kg (225 lb 15.5 oz)] 102.5 kg (225 lb 15.5 oz) (06/01 0400)  INTAKE / OUTPUT: Intake/Output     05/31 0701 - 06/01 0700 06/01 0701 - 06/02 0700   NG/GT 1770 20   IV Piggyback 100    Total Intake(mL/kg) 1870 (18.2) 20 (0.2)   Net +1870 +20        Urine Occurrence 6 x      PHYSICAL EXAMINATION: General: No distress Neuro:  Non verbal, tracking with eyes  HEENT:  Tracheostomy site clean Cardiovascular:  Tachycardic, no murmuir Lungs:  Bilateral scattered rhonchi Abdomen:  Soft, bowel sounds present Musculoskeletal:  No edema Skin:  No rash  LABS:  CBC  Recent Labs Lab 03/31/14 0848 04/01/14 0308 04/03/14 0630  WBC 9.3 7.4 6.5  HGB 12.0 9.5* 9.7*  HCT 35.9* 29.6* 29.8*  PLT 244 193 235   Coag's  Recent Labs Lab 03/31/14 0848  INR 1.14   BMET  Recent Labs Lab 03/31/14 0848  04/01/14 0308 04/03/14 0630  NA 136* 138 146  K 3.9 3.7 3.9  CL 97 105 112  CO2 25 23 24   BUN 12 6 6   CREATININE 0.53 0.42* 0.43*  GLUCOSE 111* 114* 103*   Electrolytes  Recent Labs Lab 03/31/14 0848 04/01/14 0308 04/03/14 0630  CALCIUM 9.5 9.1 9.3   Sepsis Markers  Recent Labs Lab 03/31/14 1120 04/02/14 2024 04/03/14 0630 04/04/14 0552  LATICACIDVEN 1.79  --   --   --   PROCALCITON  --  <0.10 <0.10 <0.10   ABG No results found for this basename: PHART, PCO2ART, PO2ART,  in the last 168 hours  Liver Enzymes  Recent Labs Lab 03/31/14 0848  AST 28  ALT 35  ALKPHOS 102  BILITOT 0.2*  ALBUMIN 3.1*   Cardiac Enzymes No results found for this basename: TROPONINI, PROBNP,  in the last 168 hours  Glucose  Recent Labs Lab 04/04/14 1531 04/04/14 1621 04/04/14 1958 04/04/14 2345 04/05/14 0422 04/05/14 0847  GLUCAP 115* 105* 123* 119* 110* 132*   IMAGING:   No results found. ASSESSMENT / PLAN:  PULMONARY A: Tracheitis Pneumonia less likely P:   Goal SpO2>92 Supplemental oxygen PRN Bronchial hygiene Albuterol follow pcxr further , has small lung volume rt and int prominence  CARDIOVASCULAR A:  HTN. Hx of DVT, not sure if she  is still on Coumadin P:  - well controlled with med changes made 5/31, follow closely.    RENAL A:   Mild hypernatremia 5/30 on NS  P:   Change to NS lock  Free water 200mg  QID May need 1/2 NS or d5w  GASTROINTESTINAL A:   Dysphagia Nutrition GERD P:   TF Pepcid  HEMATOLOGIC A:   Mild anemia VTE Px  Recent Labs Lab 03/31/14 0848 04/01/14 0308 04/03/14 0630  HGB 12.0 9.5* 9.7*    P:  Trend CBC SCD  INFECTIOUS A:   SIRS Tracheitis vs pneumonia (not impressed) -PCT < 0.10  X 3  P:   Continue cefepime in setting of new onset fever Check UA, WBC, CXR PCt is reassuring, will limit duration likely pcxr in am    ENDOCRINE A:   No acute issues   P:   SSI while on tube  feeds  NEUROLOGIC A:   Anxiety SAH complicated by stroke, seizures  P:   Vimpat, Keppra, Dilantin Trazodone  Ativan PRN   Given recent changes in BP medications and fevers, she could likely benefit from additional inpatient services. If BP remains controlled and no evidence of increased infection on labs and imaging can likely d/c to SNF tomorrow.   Joneen Roach, ACNP [place] Pulmonology/Critical Care Pager [phone] or [phone]   I have fully examined this patient and agree with above findings.    and edited in full  Mcarthur Rossetti. Tyson Alias, MD, FACP Pgr: [phone] [place] [facility]

## 2014-04-05 NOTE — Progress Notes (Signed)
NUTRITION FOLLOW UP  DOCUMENTATION CODES Per approved criteria  -Obesity Unspecified   INTERVENTION: Continue current TF regimen RD to follow for nutrition care plan  NUTRITION DIAGNOSIS: Inadequate oral intake related to inability to eat as evidenced by NPO status, ongoing  Goal: Pt to meet >/= 90% of their estimated nutrition needs, met  Monitor:  TF regimen & tolerance, respiratory status, weight, labs, I/O's  ASSESSMENT: 48 yo Female from NH ([place]) with cough. Hx of SAH complicated by seizure, pseudomonal PNA, respiratory failure requiring tracheostomy, and tracheomalacia.  CXR with possible pneumonia.  Patient is nonverbal.  Glucerna 1.2 formula infusing at goal rate of 50 ml/hr via G-tube with Prostat liquid protein 30 ml daily providing 1540 kcals, 87 gm protein, 966 ml of free water.  Tolerating well.  Receiving IV ABX for PNA.  Height: Ht Readings from Last 1 Encounters:  04/01/14 _0  (1.727 m)    Weight: Wt Readings from Last 1 Encounters:  04/05/14 225 lb 15.5 oz (102.5 kg)    BMI:  Body mass index is 34.37 kg/(m^2).  Estimated Nutritional Needs: Kcal: 1500-1700 Protein: 85-95 gm Fluid: 1.5-1.7 L  Skin: Stage I pressure ulcer to sacrum, Stage II pressure ulcer to buttocks  Diet Order: NPO   Intake/Output Summary (Last 24 hours) at 04/05/14 1105 Last data filed at 04/05/14 0800  Gross per 24 hour  Intake   1520 ml  Output      0 ml  Net   1520 ml    Labs:   Recent Labs Lab 03/31/14 0848 04/01/14 0308 04/03/14 0630  NA 136* 138 146  K 3.9 3.7 3.9  CL 97 105 112  CO2 _1 BUN _2 CREATININE 0.53 0.42* 0.43*  CALCIUM 9.5 9.1 9.3  GLUCOSE 111* 114* 103*    CBG (last 3)   Recent Labs  04/04/14 2345 04/05/14 0422 04/05/14 0847  GLUCAP 119* 110* 132*    Scheduled Meds: . albuterol  2.5 mg Nebulization Q6H  . antiseptic oral rinse  15 mL Mouth Rinse q12n4p  . ceFEPime (MAXIPIME) IV  1 g  Intravenous Q8H  . chlorhexidine  15 mL Mouth Rinse BID  . cholestyramine  4 g Per Tube TID  . cloNIDine  0.2 mg Oral TID  . famotidine  20 mg Per Tube Q12H  . feeding supplement (PRO-STAT SUGAR FREE 64)  30 mL Per Tube Q1500  . free water  200 mL Per Tube 4 times per day  . insulin aspart  0-15 Units Subcutaneous 6 times per day  . lacosamide  100 mg Per Tube BID  . levETIRAcetam  1,500 mg Oral BID  . metoprolol tartrate  100 mg Per Tube BID  . minoxidil  10 mg Per Tube BID  . phenytoin  200 mg Per Tube Q12H  . sodium chloride  10-40 mL Intracatheter Q12H  . traZODone  100 mg Per Tube QHS    Continuous Infusions: . feeding supplement (GLUCERNA 1.2 CAL) 1,000 mL (04/04/14 0543)    Past Medical History  Diagnosis Date  . Hypertension   . SAH (subarachnoid hemorrhage)   . Seizure   . Stroke   . Tracheostomy status   . Tracheomalacia   . DVT (deep venous thrombosis)     Past Surgical History  Procedure Laterality Date  . Abdominal hysterectomy    . Cholecystectomy      Arthur Holms, RD, LDN Pager #: [phone] After-Hours Pager #: [phone]

## 2014-04-06 ENCOUNTER — Inpatient Hospital Stay (HOSPITAL_COMMUNITY): Admission: RE | Admit: 2014-04-06 | Payer: BC Managed Care – PPO | Source: Ambulatory Visit

## 2014-04-06 ENCOUNTER — Inpatient Hospital Stay (HOSPITAL_COMMUNITY): Payer: 59

## 2014-04-06 LAB — GLUCOSE, CAPILLARY
Glucose-Capillary: 108 mg/dL — ABNORMAL HIGH (ref 70–99)
Glucose-Capillary: 112 mg/dL — ABNORMAL HIGH (ref 70–99)
Glucose-Capillary: 117 mg/dL — ABNORMAL HIGH (ref 70–99)
Glucose-Capillary: 119 mg/dL — ABNORMAL HIGH (ref 70–99)
Glucose-Capillary: 130 mg/dL — ABNORMAL HIGH (ref 70–99)
Glucose-Capillary: 130 mg/dL — ABNORMAL HIGH (ref 70–99)
Glucose-Capillary: 136 mg/dL — ABNORMAL HIGH (ref 70–99)

## 2014-04-06 LAB — CULTURE, BLOOD (ROUTINE X 2)
CULTURE: NO GROWTH
Culture: NO GROWTH

## 2014-04-06 LAB — URINE CULTURE
COLONY COUNT: NO GROWTH
Culture: NO GROWTH

## 2014-04-06 MED ORDER — FUROSEMIDE 10 MG/ML IJ SOLN
40.0000 mg | Freq: Two times a day (BID) | INTRAMUSCULAR | Status: AC
  Administered 2014-04-06 – 2014-04-07 (×2): 40 mg via INTRAVENOUS
  Filled 2014-04-06 (×2): qty 4

## 2014-04-06 NOTE — Progress Notes (Addendum)
PULMONARY / CRITICAL CARE MEDICINE   Name: [name] MRN: [id number] DOB: 1966-08-23    ADMISSION DATE:  03/31/2014  REFERRING MD :  Rancour  CHIEF COMPLAINT:  Coughing  BRIEF PATIENT DESCRIPTION: 48 yo nursing home resident with cough.  Hx of SAH complicated by seizure, Pseudomonal PNA, respiratory failure requiring tracheostomy, and tracheomalacia.  PCCM asked to admit to ICU for tracheitis vs pneumonia.  SIGNIFICANT EVENTS: 5/27 Admit, changed to #6 shiley  STUDIES:   LINES / TUBES: Trach PEG R PICC 5/28 >>>  CULTURES: MRSA PCR 5/27 >>> neg Blood 5/27 >>> Urine 5/27 >>> neg Sputum 5/28 > abundant wbc, no org seen > neg cdif 5/29 >>> neg  ANTIBIOTICS: Vancomycin 5/27 >>> 5/28 Cefepime 5/27 >>>   Subjective/INTERVAL HISTORY:   Low grade fever overnight.   VITAL SIGNS: Temp:  [97.9 F (36.6 C)-100.2 F (37.9 C)] 98.3 F (36.8 C) (06/02 1152) Pulse Rate:  [78-104] 93 (06/02 1235) Resp:  [14-35] 33 (06/02 1235) BP: (98-146)/(60-106) 118/78 mmHg (06/02 1235) SpO2:  [96 %-100 %] 100 % (06/02 1235) FiO2 (%):  [28 %] 28 % (06/02 1235) Weight:  [102.5 kg (225 lb 15.5 oz)] 102.5 kg (225 lb 15.5 oz) (06/02 0357)  INTAKE / OUTPUT: Intake/Output     06/01 0701 - 06/02 0700 06/02 0701 - 06/03 0700   NG/GT 1650    IV Piggyback 100    Total Intake(mL/kg) 1750 (17.1)    Net +1750          Urine Occurrence 5 x 1 x   Stool Occurrence 1 x      PHYSICAL EXAMINATION: General: No distress Neuro:  Non verbal, tracking with eyes  HEENT:  Tracheostomy site clean Cardiovascular:  Tachycardic, no murmuir Lungs:  Bilateral scattered rhonchi Abdomen:  Soft, bowel sounds present Musculoskeletal:  No edema Skin:  No rash  LABS:  CBC  Recent Labs Lab 04/01/14 0308 04/03/14 0630 04/05/14 1330  WBC 7.4 6.5 7.0  HGB 9.5* 9.7* 10.5*  HCT 29.6* 29.8* 32.2*  PLT 193 235 219   Coag's  Recent Labs Lab 03/31/14 0848  INR 1.14   BMET  Recent Labs Lab  03/31/14 0848 04/01/14 0308 04/03/14 0630  NA 136* 138 146  K 3.9 3.7 3.9  CL 97 105 112  CO2 25 23 24   BUN 12 6 6   CREATININE 0.53 0.42* 0.43*  GLUCOSE 111* 114* 103*   Electrolytes  Recent Labs Lab 03/31/14 0848 04/01/14 0308 04/03/14 0630  CALCIUM 9.5 9.1 9.3   Sepsis Markers  Recent Labs Lab 03/31/14 1120 04/02/14 2024 04/03/14 0630 04/04/14 0552  LATICACIDVEN 1.79  --   --   --   PROCALCITON  --  <0.10 <0.10 <0.10   ABG No results found for this basename: PHART, PCO2ART, PO2ART,  in the last 168 hours  Liver Enzymes  Recent Labs Lab 03/31/14 0848  AST 28  ALT 35  ALKPHOS 102  BILITOT 0.2*  ALBUMIN 3.1*   Cardiac Enzymes No results found for this basename: TROPONINI, PROBNP,  in the last 168 hours  Glucose  Recent Labs Lab 04/05/14 1652 04/05/14 2002 04/06/14 0004 04/06/14 0419 04/06/14 0803 04/06/14 1150  GLUCAP 107* 114* 119* 130* 117* 130*   IMAGING:   Dg Chest Port 1 View  04/06/2014   CLINICAL DATA:  Tracheostomy tube.  EXAM: PORTABLE CHEST - 1 VIEW  COMPARISON:  Single view of the chest 04/05/2014 and 04/01/2014.  FINDINGS: Tracheostomy tube remains in place with  the tip just above the clavicular heads. Right PICC has been repositioned with the tip now in the mid superior vena cava. Elevation of the right hemidiaphragm is again identified. The lungs are clear. No pneumothorax or pleural effusion. Heart size normal.  IMPRESSION: Right PICC has been repositioned with the tip now on the mid superior vena cava. Tracheostomy tube is unchanged.  Lungs clear.   Electronically Signed   By: Drusilla Kannerhomas  Dalessio M.D.   On: 04/06/2014 07:24   Dg Chest Port 1 View  04/05/2014   CLINICAL DATA:  Tracheostomy, subarachnoid hemorrhage  EXAM: PORTABLE CHEST - 1 VIEW  COMPARISON:  04/01/2014  FINDINGS: Tracheostomy 6.1 cm above the carina. Low lung volumes persist with right hemidiaphragm elevation. Exam is rotated to the right. New right PICC line tip crosses  midline likely in the left innominate vein. No large effusion or pneumothorax.  IMPRESSION: Low volume exam with right hemidiaphragm elevation  Tracheostomy 6.1 cm above the carina  New right PICC line tip crosses midline, suspect within the left innominate vein.   Electronically Signed   By: Ruel Favorsrevor  Shick M.D.   On: 04/05/2014 13:32   ASSESSMENT / PLAN:  PULMONARY A: Tracheitis Pneumonia less likely R/o edema P:   Goal SpO2>92 Supplemental oxygen PRN Bronchial hygiene Albuterol follow pcxr further , has small lung volume rt and int prominence Lasix to neg balance, has int prominence, ? edema  CARDIOVASCULAR A:  HTN. Hx of DVT, not sure if she is still on Coumadin P:  - well controlled with med changes made 5/31, follow closely.    RENAL A:   Mild hypernatremia 5/30 on NS  Hypervolemia  P:   Change to NS lock  Diurese Lasix 40 x 2 Goal neg 1 liter Chem in am with lasix addition  GASTROINTESTINAL A:   Dysphagia Nutrition GERD P:   TF Pepcid  HEMATOLOGIC A:   Mild anemia VTE Px  Recent Labs Lab 04/01/14 0308 04/03/14 0630 04/05/14 1330  HGB 9.5* 9.7* 10.5*    P:  Trend CBC SCD  INFECTIOUS A:   SIRS Tracheitis vs pneumonia (not impressed) -PCT < 0.10  X 3  P:   Dc cefepime  Pct is reassuring pcxr without infiltrates   ENDOCRINE A:   No acute issues   P:   SSI while on tube feeds  NEUROLOGIC A:   Anxiety SAH complicated by stroke, seizures  P:   Vimpat, Keppra, Dilantin Trazodone  Ativan PRN   Transfer to floor, has SNF placement when ready for d/c   Joneen RoachPaul Hoffman, ACNP [facility] Pulmonology/Critical Care Pager [phone] or [phone]   I have fully examined this patient and agree with above findings.     Mcarthur Rossettianiel J. Tyson AliasFeinstein, MD, FACP Pgr: [phone] [place] [facility]

## 2014-04-06 NOTE — Progress Notes (Signed)
Sister, Maelie Sirman, would like to be contacted by the MD for an update on the patient. Her cell number is [phone].

## 2014-04-07 DIAGNOSIS — J9610 Chronic respiratory failure, unspecified whether with hypoxia or hypercapnia: Secondary | ICD-10-CM

## 2014-04-07 LAB — BASIC METABOLIC PANEL
BUN: 11 mg/dL (ref 6–23)
CHLORIDE: 103 meq/L (ref 96–112)
CO2: 28 mEq/L (ref 19–32)
Calcium: 9.5 mg/dL (ref 8.4–10.5)
Creatinine, Ser: 0.42 mg/dL — ABNORMAL LOW (ref 0.50–1.10)
GFR calc Af Amer: >90 mL/min (ref >90)
GFR calc non Af Amer: >90 mL/min (ref >90)
GLUCOSE: 117 mg/dL — AB (ref 70–99)
Potassium: 3.7 mEq/L (ref 3.7–5.3)
Sodium: 144 mEq/L (ref 137–147)

## 2014-04-07 LAB — CBC
HEMATOCRIT: 32.8 % — AB (ref 36.0–46.0)
Hemoglobin: 10.7 g/dL — ABNORMAL LOW (ref 12.0–15.0)
MCH: 28.2 pg (ref 26.0–34.0)
MCHC: 32.6 g/dL (ref 30.0–36.0)
MCV: 86.3 fL (ref 78.0–100.0)
Platelets: 237 10*3/uL (ref 150–400)
RBC: 3.8 MIL/uL — AB (ref 3.87–5.11)
RDW: 15.9 % — ABNORMAL HIGH (ref 11.5–15.5)
WBC: 8.4 10*3/uL (ref 4.0–10.5)

## 2014-04-07 LAB — GLUCOSE, CAPILLARY
GLUCOSE-CAPILLARY: 119 mg/dL — AB (ref 70–99)
Glucose-Capillary: 116 mg/dL — ABNORMAL HIGH (ref 70–99)
Glucose-Capillary: 125 mg/dL — ABNORMAL HIGH (ref 70–99)
Glucose-Capillary: 108 mg/dL — ABNORMAL HIGH (ref 70–99)

## 2014-04-07 MED ORDER — BIOTENE DRY MOUTH MT LIQD: 15.0000 mL | Freq: Two times a day (BID) | OROMUCOSAL | Status: DC

## 2014-04-07 MED ORDER — FREE WATER: 200.0000 mL | Freq: Four times a day (QID) | Status: DC

## 2014-04-07 MED ORDER — GLUCERNA 1.2 CAL PO LIQD: 1000.0000 mL | ORAL | Status: DC

## 2014-04-07 MED ORDER — MINOXIDIL 10 MG PO TABS: 10.0000 mg | ORAL_TABLET | Freq: Two times a day (BID) | ORAL | Status: DC

## 2014-04-07 MED ORDER — CHLORHEXIDINE GLUCONATE 0.12 % MT SOLN: 15.0000 mL | Freq: Two times a day (BID) | OROMUCOSAL | Status: DC

## 2014-04-07 MED ORDER — METOPROLOL TARTRATE 100 MG PO TABS: 100.0000 mg | ORAL_TABLET | Freq: Two times a day (BID) | ORAL | Status: DC

## 2014-04-07 MED ORDER — CLONIDINE HCL 0.2 MG PO TABS: 0.2000 mg | ORAL_TABLET | Freq: Three times a day (TID) | ORAL | Status: DC

## 2014-04-07 MED ORDER — FAMOTIDINE 40 MG/5ML PO SUSR: 20.0000 mg | Freq: Two times a day (BID) | ORAL | Status: DC

## 2014-04-07 MED ORDER — ALBUTEROL SULFATE (2.5 MG/3ML) 0.083% IN NEBU: 2.5000 mg | INHALATION_SOLUTION | Freq: Four times a day (QID) | RESPIRATORY_TRACT | Status: DC

## 2014-04-07 MED ORDER — PRO-STAT SUGAR FREE PO LIQD: 30.0000 mL | Freq: Every day | ORAL | Status: DC

## 2014-04-07 NOTE — Progress Notes (Signed)
Report called and given to [facility].  

## 2014-04-07 NOTE — Discharge Summary (Signed)
STaff Note  -chronic critical illness following SAH. Going to [facility] 04/07/2014. See NP note for details. > 35 minutes dc planning   Dr. Kalman Shan, M.D., [facility].C.P Pulmonary and Critical Care Medicine Staff Physician [facility] [place] [facility] Pager: [phone], If no answer or between  15:00h - 7:00h: call [phone]  04/07/2014 12:44 PM

## 2014-04-07 NOTE — Progress Notes (Signed)
Vitals took off continuous pulse ox. Pt in no distress at this time. 

## 2014-04-07 NOTE — Progress Notes (Signed)
Rt changed trach from #6 shiley cuffed to a #6 shiley uncuffed per MD order. There was good color change C02 detector, equal BS, sats 98% on 28% TC, HR 102, RR 18. Pt in no distress at this time.

## 2014-04-07 NOTE — Plan of Care (Signed)
Problem: Phase I Progression Outcomes Goal: OOB as tolerated unless otherwise ordered Outcome: Adequate for Discharge Pt going to SNF

## 2014-04-07 NOTE — Clinical Social Work Placement (Signed)
Clinical Social Work Department CLINICAL SOCIAL WORK PLACEMENT NOTE 04/07/2014  Patient:  [name], [name]  Account Number:  [id number] Admit date:  03/31/2014  Clinical Social Worker:  Maryclare Labrador, Theresia Majors  Date/time:  04/01/2014 03:03 PM  Clinical Social Work is seeking post-discharge placement for this patient at the following level of care:   SKILLED NURSING   (*CSW will update this form in Epic as items are completed)     Patient/family provided with [facility] Department of Clinical Social Work's list of facilities offering this level of care within the geographic area requested by the patient (or if unable, by the patient's family).  04/01/2014  Patient/family informed of their freedom to choose among providers that offer the needed level of care, that participate in Medicare, Medicaid or managed care program needed by the patient, have an available bed and are willing to accept the patient.    Patient/family informed of [facility]' ownership interest in [facility], as well as of the fact that they are under no obligation to receive care at this facility.  PASARR submitted to EDS on  PASARR number received from EDS on   FL2 transmitted to all facilities in geographic area requested by pt/family on  04/01/2014 FL2 transmitted to all facilities within larger geographic area on   Patient informed that his/her managed care company has contracts with or will negotiate with  certain facilities, including the following:     Patient/family informed of bed offers received:  04/06/2014 Patient chooses bed at [facility] Physician recommends and patient chooses bed at    Patient to be transferred to [facility] on  04/07/2014 Patient to be transferred to facility by Ambulance  The following physician request were entered in Epic:   Additional Comments: Per MD patient ready to DC to [facility]. RN, patient's son [name],  and facility notified of DC. RN given number for report. DC packet on chart. RN will call for transport when she is ready to DC patient (waiting on PICC removal and RT visit). RN will also notify son of time that ambulance transport is requested. CSW signing off at this time.    Roddie Mc MSW, [place], [place], [phone]

## 2014-04-07 NOTE — Care Management Note (Signed)
    Page 1 of 1   04/07/2014     3:51:14 PM CARE MANAGEMENT NOTE 04/07/2014  Patient:  [name], [name]   Account Number:  [id number]  Date Initiated:  04/02/2014  Documentation initiated by:  MAYO,HENRIETTA  Subjective/Objective Assessment:   dx tracheitis; chronic trach and PEG; resident of [facility]     Action/Plan:   Anticipated DC Date:  04/07/2014   Anticipated DC Plan:  SKILLED NURSING FACILITY  In-house referral  Clinical Social Worker      DC Planning Services  CM consult      Choice offered to / List presented to:             Status of service:  Completed, signed off Medicare Important Message given?   (If response is "NO", the following Medicare IM given date fields will be blank) Date Medicare IM given:   Date Additional Medicare IM given:    Discharge Disposition:  SKILLED NURSING FACILITY  Per UR Regulation:  Reviewed for med. necessity/level of care/duration of stay  If discussed at Long Length of Stay Meetings, dates discussed:   04/06/2014    Comments:

## 2014-04-07 NOTE — Progress Notes (Signed)
Pt tranfserred to 5W. Unit CSW provided a handoff. This CSW signing off.   Maryclare Labrador, MSW, LCSWA Clinical Social Worker

## 2014-04-07 NOTE — Discharge Summary (Signed)
Physician Discharge Summary  Patient ID: [name] MRN: [id number] DOB/AGE: 1966-01-28 48 y.o.  Admit date: 03/31/2014 Discharge date: 04/07/2014    Discharge Diagnoses:  Chronic Respiratory Failure Tracheitis SIRS HTN Tachycardia Hx DVT Hypernatremia Hypervolemia  Dysphagia Protein Calorie Malnutrition GERD Anemia Anxiety SAH / CVA Seizures                                                                     DISCHARGE PLAN BY DIAGNOSIS     Chronic Respiratory Failure Tracheitis SIRS Trach Status  Discharge Plan: Off abx, monitor trach secretions closely for increased production or color change Trach care daily & PRN Trach size: #6 cuffless Suction PRN  Oxygen 28% ATC Albuterol PRN Guaifenesin   HTN Tachycardia  Discharge Plan: Metoprolol increased to 100 mg BID Minoxidil 50m BID Clonidine 0.269mQD  Hypernatremia Hypervolemia   Discharge Plan: Free water 200 ml Q6 PRN BMP to follow Na+  Dysphagia Protein Calorie Malnutrition GERD  Discharge Plan: Glucerna 1.2 at 50 ml/hr Pro-stat  Pepcid  Anemia Hx DVT  Discharge Plan: PRN CBC to monitor H/H  Anxiety SAH / CVA Seizures          Discharge Plan: Continue keppra, vimpat Continue trazodone  Decubitus Ulcer Fungal Rash   Discharge Plan: Resume fluconazole Foam dressing to sacral ulcer                 DISCHARGE SUMMARY   [name] a [age].o. 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 She required intubation, and eventual tracheostomy.  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   [facility] At baseline she tracks but does not follow commands and is non-verbal. She developed a cough and concern about  tracheitis on 5/22. She was started on clindamycin. Her symptoms progressed, and she was sent to ER on 5/27 for evaluation.  ER evaluation noted tachypnea but no significant distress.  Portex trach was changed to Shiley #6 on admission.  Initially there were concerns for tracheitis vs PNA and patient was empirically treated with IV antibiotics.  See below for details.  Patient was pan cultured and all cultures were negative.  She completed a 7 day course of IV antibiotics with improvement in trach secretions consistent with tracheitis not PNA.  CXR remained clear without infiltrate.  Tracheostomy was changed to cuff-less trach prior to discharge. She will continue on 28% ATC.   She was continued on anti-epileptic drugs during admission.  It was noted she was sub-therapeutic on admit with 2.5 dilantin level.  Discharge medications as below.    Hospital course complicated by significant hypertension requiring multiple adjustments of regimen -see final as below.  Patient was tachycardic during admission ranging from 90's to low 100's.  She was also noted to have hypernatremia and was corrected with free water.  Patient slowly made improvement and is cleared for discharge to SNF on 6/3.  See discharge plan as above.  LINES / TUBES:  Trach #6 Shiley >> PEG >> R PICC 5/28 >>> 6/2  CULTURES:  MRSA PCR 5/27 >>> neg  Blood 5/27 >>> neg Urine 5/27 >>> neg  Sputum 5/28 > abundant wbc, no org seen > neg  cdif 5/29 >>> neg   ANTIBIOTICS:  Vancomycin 5/27 >>> 5/28  Cefepime 5/27 >>>6/2    Discharge Exam: General: No distress  Neuro: Non verbal, tracking with eyes  HEENT: #6 tracheostomy site clean  Cardiovascular: Tachycardic, no murmuir  Lungs: resp's even/non-labored, lungs bilaterally coarse  Abdomen: Soft, bowel sounds present  Musculoskeletal: No edema  Skin: No rash   Filed Vitals:   04/07/14 0736 04/07/14 0740 04/07/14 1013 04/07/14 1141  BP:   122/86   Pulse:  104 113 94   Temp:      TempSrc:      Resp:  18  18  Height:      Weight:      SpO2: 97% 97%  99%     Discharge Labs  BMET  Recent Labs Lab 04/01/14 0308 04/03/14 0630 04/07/14 0847  NA 138 146 144  K 3.7 3.9 3.7  CL 105 112 103  CO2 _0 GLUCOSE 114* 103* 117*  BUN _1 CREATININE 0.42* 0.43* 0.42*  CALCIUM 9.1 9.3 9.5   CBC  Recent Labs Lab 04/03/14 0630 04/05/14 1330 04/07/14 0847  HGB 9.7* 10.5* 10.7*  HCT 29.8* 32.2* 32.8*  WBC 6.5 7.0 8.4  PLT 235 219 237       Medication List    STOP taking these medications       azelastine 0.1 % nasal spray  Commonly known as:  ASTELIN     CLINDAMYCIN PHOSPHATE IV     diclofenac sodium 1 % Gel  Commonly known as:  VOLTAREN     famotidine 20 MG tablet  Commonly known as:  PEPCID  Replaced by:  famotidine 40 MG/5ML suspension     hydrALAZINE 50 MG tablet  Commonly known as:  APRESOLINE     prazosin 2 MG capsule  Commonly known as:  MINIPRESS      TAKE these medications       acetaminophen 325 MG tablet  Commonly known as:  TYLENOL  Give 650 mg by tube every 4 (four) hours as needed (elevated temperature).     albuterol (2.5 MG/3ML) 0.083% nebulizer solution  Commonly known as:  PROVENTIL  Take 3 mLs (2.5 mg total) by nebulization every 6 (six) hours.     antiseptic oral rinse Liqd  15 mLs by Mouth Rinse route 2 times daily at 12 noon and 4 pm.     CEROVITE ADVANCED FORMULA Liqd  Give 15 mLs by tube daily at 12 noon.     chlorhexidine 0.12 % solution  Commonly known as:  PERIDEX  15 mLs by Mouth Rinse route 2 (two) times daily.     cholestyramine 4 G packet  Commonly known as:  QUESTRAN  Place 4 g into feeding tube 3 (three) times daily.     cloNIDine 0.2 MG tablet  Commonly known as:  CATAPRES  Take 1 tablet (0.2 mg total) by mouth 3 (three) times daily.     famotidine 40 MG/5ML suspension  Commonly known as:  PEPCID  Place 2.5 mLs (20 mg total) into feeding tube every 12 (twelve)  hours.     feeding supplement (GLUCERNA 1.2 CAL) Liqd  Place 1,000 mLs into feeding tube continuous. Infuse at  50 ml /hr     feeding supplement (PRO-STAT SUGAR FREE 64) Liqd  Place 30 mLs into feeding tube daily at 3 pm.     fluconazole 150 MG tablet  Commonly known as:  DIFLUCAN  Give 150 mg by tube See admin instructions. Once weekly (Thursday) X 3 weeks for fungal rash. Order started on 03/18/14.     free water Soln  Place 200 mLs into feeding tube every 6 (six) hours.     lacosamide 50 MG Tabs tablet  Commonly known as:  VIMPAT  Give 100 mg by tube 2 (two) times daily.     levETIRAcetam 100 MG/ML solution  Commonly known as:  KEPPRA  Take 1,500 mg by mouth 2 (two) times daily.     metoprolol 100 MG tablet  Commonly known as:  LOPRESSOR  Place 1 tablet (100 mg total) into feeding tube 2 (two) times daily.     minoxidil 10 MG tablet  Commonly known as:  LONITEN  Place 1 tablet (10 mg total) into feeding tube 2 (two) times daily.     phenytoin 100 MG/4ML suspension  Commonly known as:  DILANTIN  Give 200 mg by tube every 12 (twelve) hours.     ROBAFEN 100 MG/5ML syrup  Generic drug:  guaifenesin  Give 200 mg by tube every 6 (six) hours as needed for cough.     traZODone 100 MG tablet  Commonly known as:  DESYREL  Give 100 mg by tube at bedtime.          Disposition: [facility]  Discharged Condition: Mayci Haning has met maximum benefit of inpatient care and is medically stable and cleared for discharge.  Patient is pending follow up as above.      Time spent on disposition:  Greater than 35 minutes.   Signed: Noe Gens, NP-C [facility] Pgr: [phone] Office: [phone]

## 2014-04-07 NOTE — Progress Notes (Signed)
Meghan PoreLaura Marie Steffensen to be D/C'd SNF per MD order.  Discussed with the patient and all questions fully answered.    Medication List    STOP taking these medications       azelastine 0.1 % nasal spray  Commonly known as:  ASTELIN     CLINDAMYCIN PHOSPHATE IV     diclofenac sodium 1 % Gel  Commonly known as:  VOLTAREN     famotidine 20 MG tablet  Commonly known as:  PEPCID  Replaced by:  famotidine 40 MG/5ML suspension     hydrALAZINE 50 MG tablet  Commonly known as:  APRESOLINE     prazosin 2 MG capsule  Commonly known as:  MINIPRESS      TAKE these medications       acetaminophen 325 MG tablet  Commonly known as:  TYLENOL  Give 650 mg by tube every 4 (four) hours as needed (elevated temperature).     albuterol (2.5 MG/3ML) 0.083% nebulizer solution  Commonly known as:  PROVENTIL  Take 3 mLs (2.5 mg total) by nebulization every 6 (six) hours.     antiseptic oral rinse Liqd  15 mLs by Mouth Rinse route 2 times daily at 12 noon and 4 pm.     CEROVITE ADVANCED FORMULA Liqd  Give 15 mLs by tube daily at 12 noon.     chlorhexidine 0.12 % solution  Commonly known as:  PERIDEX  15 mLs by Mouth Rinse route 2 (two) times daily.     cholestyramine 4 G packet  Commonly known as:  QUESTRAN  Place 4 g into feeding tube 3 (three) times daily.     cloNIDine 0.2 MG tablet  Commonly known as:  CATAPRES  Take 1 tablet (0.2 mg total) by mouth 3 (three) times daily.     famotidine 40 MG/5ML suspension  Commonly known as:  PEPCID  Place 2.5 mLs (20 mg total) into feeding tube every 12 (twelve) hours.     feeding supplement (GLUCERNA 1.2 CAL) Liqd  Place 1,000 mLs into feeding tube continuous. Infuse at 50 ml /hr     feeding supplement (PRO-STAT SUGAR FREE 64) Liqd  Place 30 mLs into feeding tube daily at 3 pm.     fluconazole 150 MG tablet  Commonly known as:  DIFLUCAN  Give 150 mg by tube See admin instructions. Once weekly (Thursday) X 3 weeks for fungal rash. Order started  on 03/18/14.     free water Soln  Place 200 mLs into feeding tube every 6 (six) hours.     lacosamide 50 MG Tabs tablet  Commonly known as:  VIMPAT  Give 100 mg by tube 2 (two) times daily.     levETIRAcetam 100 MG/ML solution  Commonly known as:  KEPPRA  Take 1,500 mg by mouth 2 (two) times daily.     metoprolol 100 MG tablet  Commonly known as:  LOPRESSOR  Place 1 tablet (100 mg total) into feeding tube 2 (two) times daily.     minoxidil 10 MG tablet  Commonly known as:  LONITEN  Place 1 tablet (10 mg total) into feeding tube 2 (two) times daily.     phenytoin 100 MG/4ML suspension  Commonly known as:  DILANTIN  Give 200 mg by tube every 12 (twelve) hours.     ROBAFEN 100 MG/5ML syrup  Generic drug:  guaifenesin  Give 200 mg by tube every 6 (six) hours as needed for cough.     traZODone 100 MG tablet  Commonly known as:  DESYREL  Give 100 mg by tube at bedtime.        VVS, Skin clean, dry and intact without evidence of skin break down, no evidence of skin tears noted. IV catheter discontinued intact. Site without signs and symptoms of complications. Dressing and pressure applied.  An After Visit Summary was printed and given to the patient.  D/c education completed with patient/family including follow up instructions, medication list, d/c activities limitations if indicated, with other d/c instructions as indicated by MD - patient able to verbalize understanding, all questions fully answered.   Patient instructed to return to ED, call 911, or call MD for any changes in condition.   Patient escorted via strecther, and D/C SNF via EMS.  Al Decant 04/07/2014 4:27 PM

## 2014-07-02 ENCOUNTER — Emergency Department (HOSPITAL_COMMUNITY)
Admission: EM | Admit: 2014-07-02 | Discharge: 2014-07-02 | Disposition: A | Discharge status: Home / Self Care | Payer: 59 | Attending: Emergency Medicine | Admitting: Emergency Medicine

## 2014-07-02 ENCOUNTER — Encounter (HOSPITAL_COMMUNITY): Payer: Self-pay | Admitting: Emergency Medicine

## 2014-07-02 DIAGNOSIS — Z8701 Personal history of pneumonia (recurrent): Secondary | ICD-10-CM | POA: Diagnosis not present

## 2014-07-02 DIAGNOSIS — R569 Unspecified convulsions: Secondary | ICD-10-CM | POA: Diagnosis not present

## 2014-07-02 DIAGNOSIS — Z79899 Other long term (current) drug therapy: Secondary | ICD-10-CM | POA: Diagnosis not present

## 2014-07-02 DIAGNOSIS — Z8673 Personal history of transient ischemic attack (TIA), and cerebral infarction without residual deficits: Secondary | ICD-10-CM | POA: Diagnosis not present

## 2014-07-02 DIAGNOSIS — Z91040 Latex allergy status: Secondary | ICD-10-CM | POA: Diagnosis not present

## 2014-07-02 DIAGNOSIS — F17200 Nicotine dependence, unspecified, uncomplicated: Secondary | ICD-10-CM | POA: Insufficient documentation

## 2014-07-02 DIAGNOSIS — Z8709 Personal history of other diseases of the respiratory system: Secondary | ICD-10-CM | POA: Insufficient documentation

## 2014-07-02 DIAGNOSIS — Z930 Tracheostomy status: Secondary | ICD-10-CM | POA: Insufficient documentation

## 2014-07-02 DIAGNOSIS — I10 Essential (primary) hypertension: Secondary | ICD-10-CM | POA: Insufficient documentation

## 2014-07-02 DIAGNOSIS — J9503 Malfunction of tracheostomy stoma: Secondary | ICD-10-CM | POA: Insufficient documentation

## 2014-07-02 DIAGNOSIS — Z86718 Personal history of other venous thrombosis and embolism: Secondary | ICD-10-CM | POA: Insufficient documentation

## 2014-07-02 DIAGNOSIS — Z430 Encounter for attention to tracheostomy: Secondary | ICD-10-CM | POA: Diagnosis present

## 2014-07-02 DIAGNOSIS — Z9889 Other specified postprocedural states: Secondary | ICD-10-CM | POA: Insufficient documentation

## 2014-07-02 DIAGNOSIS — Z880 Allergy status to penicillin: Secondary | ICD-10-CM | POA: Insufficient documentation

## 2014-07-02 NOTE — ED Notes (Signed)
Called [facility] to transport patient back to [facility]. Facility made aware pt coming back. Also family spoke with resident. Pt trach site is closed. Family acknowledges this.

## 2014-07-02 NOTE — ED Provider Notes (Signed)
CSN: [id number]     Arrival date & time 07/02/14  1305 History   First MD Initiated Contact with Patient 07/02/14 1312     Chief Complaint  Patient presents with  . Tracheostomy Tube Change   HPI [name] is a 48 yo woman who experienced an intraoperative rupture during [facility]COM repair that lead to [facility], b/l ACA stroke, L caudate stroke and resulted in a seizure. Her hospitalization was complicated by pseudomonas PNA with acute respiratory failure with trach placement and proteus UTI. She is here from her nursing home [facility]) for replacement of her trach, which was noted to be displaced this morning.  Past Medical History  Diagnosis Date  . Hypertension   . SAH (subarachnoid hemorrhage)   . Seizure   . Stroke   . Tracheostomy status   . Tracheomalacia   . DVT (deep venous thrombosis)    Past Surgical History  Procedure Laterality Date  . Abdominal hysterectomy    . Cholecystectomy     Family History  Problem Relation Age of Onset  . Heart disease Mother 51    MI   History  Substance Use Topics  . Smoking status: Current Every Day Smoker    Types: Cigarettes  . Smokeless tobacco: Never Used     Comment: trying to quit-2 cigarettes daily  . Alcohol Use: Yes     Comment: Very seldom   OB History   Grav Para Term Preterm Abortions TAB SAB Ect Mult Living                 Review of Systems ROS was not attainable, as patient is not able to communicate  Allergies  Amlodipine; Latex; Other; and Penicillins  Home Medications   Prior to Admission medications   Medication Sig Start Date End Date Taking? Authorizing Provider  acetaminophen (TYLENOL) 325 MG tablet Give 650 mg by tube every 4 (four) hours as needed (elevated temperature).    Historical Provider, MD  albuterol (PROVENTIL) (2.5 MG/3ML) 0.083% nebulizer solution Take 3 mLs (2.5 mg total) by nebulization every 6 (six) hours. 04/07/14   Jeanella Craze, NP  Amino Acids-Protein Hydrolys (FEEDING SUPPLEMENT,  PRO-STAT SUGAR FREE 64,) LIQD Place 30 mLs into feeding tube daily at 3 pm. 04/07/14   Jeanella Craze, NP  antiseptic oral rinse (BIOTENE) LIQD 15 mLs by Mouth Rinse route 2 times daily at 12 noon and 4 pm. 04/07/14   Jeanella Craze, NP  chlorhexidine (PERIDEX) 0.12 % solution 15 mLs by Mouth Rinse route 2 (two) times daily. 04/07/14   Jeanella Craze, NP  cholestyramine Lanetta Inch) 4 G packet Place 4 g into feeding tube 3 (three) times daily.     Historical Provider, MD  cloNIDine (CATAPRES) 0.2 MG tablet Take 1 tablet (0.2 mg total) by mouth 3 (three) times daily. 04/07/14   Jeanella Craze, NP  famotidine (PEPCID) 40 MG/5ML suspension Place 2.5 mLs (20 mg total) into feeding tube every 12 (twelve) hours. 04/07/14   Jeanella Craze, NP  fluconazole (DIFLUCAN) 150 MG tablet Give 150 mg by tube See admin instructions. Once weekly (Thursday) X 3 weeks for fungal rash. Order started on 03/18/14.    Historical Provider, MD  guaifenesin (ROBAFEN) 100 MG/5ML syrup Give 200 mg by tube every 6 (six) hours as needed for cough.    Historical Provider, MD  lacosamide (VIMPAT) 50 MG TABS tablet Give 100 mg by tube 2 (two) times daily.     Historical Provider,  MD  levETIRAcetam (KEPPRA) 100 MG/ML solution Take 1,500 mg by mouth 2 (two) times daily.    Historical Provider, MD  metoprolol (LOPRESSOR) 100 MG tablet Place 1 tablet (100 mg total) into feeding tube 2 (two) times daily. 04/07/14   Jeanella Craze, NP  minoxidil (LONITEN) 10 MG tablet Place 1 tablet (10 mg total) into feeding tube 2 (two) times daily. 04/07/14   Jeanella Craze, NP  Multiple Vitamins-Minerals (CEROVITE ADVANCED FORMULA) LIQD Give 15 mLs by tube daily at 12 noon.     Historical Provider, MD  Nutritional Supplements (FEEDING SUPPLEMENT, GLUCERNA 1.2 CAL,) LIQD Place 1,000 mLs into feeding tube continuous. Infuse at 50 ml /hr 04/07/14   Jeanella Craze, NP  phenytoin (DILANTIN) 100 MG/4ML suspension Give 200 mg by tube every 12 (twelve) hours.     Historical  Provider, MD  traZODone (DESYREL) 100 MG tablet Give 100 mg by tube at bedtime.     Historical Provider, MD  Water For Irrigation, Sterile (FREE WATER) SOLN Place 200 mLs into feeding tube every 6 (six) hours. 04/07/14   Jeanella Craze, NP   BP 114/69  Pulse 91  Temp(Src) 99.9 F (37.7 C) (Oral)  Resp 20  SpO2 97% Physical Exam Appearance: in NAD, lying in bed, nods yes at times, but not appropriately, unable to follow commands  HEENT: trach site is exposed, clean, nonbloody, nonpurulent, nonerythematous around site, no signs of infection, pupils equal and reactive, EOMi Heart: RRR, normal S1S2 Lungs: moving air comfortably, satting 98%, CTAB Abdomen: BS+, obese, soft, nontender Musculoskeletal: does not move extremities to command, did not observe her moving extremities, atraumatic Neurologic: responsive to pain, smiles and nods, but these are not appropriate to questioning, difficult to assess for sensation   ED Course  Procedures (including critical care time) Labs Review Labs Reviewed - No data to display  Imaging Review No results found.   EKG Interpretation None      MDM   Final diagnoses:  None    [name] is a 49 yo woman who is here after her NH found her trach to be displaced this morning. Her family would like the trach to stay out. She is moving air well without it and satting 96-98% with no signs of infection around the trach site. Her trach site will be dressed (with neosporin and gauze) and she will return home to [facility].   Dionne Ano, MD 07/02/14 [phone]

## 2014-07-02 NOTE — Discharge Instructions (Signed)
Please keep tracheostomy site clean and dry (patient is leaving ED with neosporin and gauze in place). Site does not look worrisome for infection and patient was satting in the high 90s throughout ED visit (96-98%) on room air.

## 2014-07-02 NOTE — ED Notes (Signed)
Pt placed on BP, Pulse ox and 5Lead. Call Bell placed in reach.

## 2014-07-02 NOTE — ED Notes (Signed)
Per EMS- Pt comes from [facility], RN at  Facility reports she went to clean trach today and noticed it was out. Staff reports the pt always runs a fever; Pt is nonverbal at baseline. RT at bedside, reports trach is completely closed up. Pt in no respiratory distress at current.

## 2014-07-02 NOTE — ED Provider Notes (Signed)
This patient was seen in conjunction with the resident physician. The documentation accurately reflects the patient's encounter in the emergency department. On my evaluation, patient was in no distress, with good oxygen saturation on room air. Patient's tracheostomy site was clearly closed, and an attempt to recannulate was likely to cause more harm than benefit. Patient's family reiterated their preference for abstaining from a recannulation. Given the absence of other concerns the patient was appropriate for discharge back to her nursing facility.  Gerhard Munch, MD 07/02/14 [phone]

## 2014-07-05 ENCOUNTER — Encounter: Payer: Self-pay | Admitting: Neurology

## 2014-07-05 ENCOUNTER — Ambulatory Visit (INDEPENDENT_AMBULATORY_CARE_PROVIDER_SITE_OTHER): Payer: 59 | Admitting: Neurology

## 2014-07-05 VITALS — BP 150/110 | HR 98 | Resp 20

## 2014-07-05 DIAGNOSIS — I639 Cerebral infarction, unspecified: Secondary | ICD-10-CM

## 2014-07-05 DIAGNOSIS — I6350 Cerebral infarction due to unspecified occlusion or stenosis of unspecified cerebral artery: Secondary | ICD-10-CM

## 2014-07-05 DIAGNOSIS — R569 Unspecified convulsions: Secondary | ICD-10-CM

## 2014-07-05 NOTE — Patient Instructions (Signed)
1. MRI brain without contrast 2. Routine EEG 3. Start physical therapy and speech therapy/swallow evaluation at SNF 4. Continue all medications 5. Follow-up in 3 months

## 2014-07-05 NOTE — Progress Notes (Addendum)
NEUROLOGY CONSULTATION NOTE  [name] MRN: [id number] DOB: [birth year]  Referring provider: Dr. Ruthe Mannan Primary care provider: Dr. Ruthe Mannan  Reason for consult:  Seizures, stroke  Dear Dr Dayton Martes:  Thank you for your kind referral of [name] for consultation of the above symptoms. Although her history is well known to you, please allow me to reiterate it for the purpose of our medical record. The patient was accompanied to the clinic by 3 family members, including her son who also provides majority of information. Records from [facility] and [facility] were personally reviewed. She had been moved to [place], [place] after her prolonged hospitalization at [facility], then to [facility] in May 2015. She is now at [facility].  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 She underwent craniotomy for clip ligation in 10/2013, per records, the procedure was complicated by intraoperative seizures despite keppra load, ativan, and propofol so she was loaded with fosphenytoin. She also had intraoperative hemorrhage. Imaging post op showed infarct extension with bilateral ACA infarcts and left sided caudate infarct. Last MRI report available from 10/27/2013 showed left frontal craniotomy, right frontal approach ventricular catheter placement, and anterior communicating artery aneurysm clipping are noted. Restricted diffusion in bilateral ACA territories compatible with subacute infarction.  When she was discharged to rehab, she was unable to move all her extremities with note of bilateral hand contractures, non-verbal, tracks.  Her son reports that she was moved to a rehab center in [place], where she did quite well, she could grasp balls, state where she was born, where she was at, and could take sips of water. He reports that she had a seizure in  February, April, and most recently in May. The son only witnessed one of the seizures where she was biting down with head turn to the left. They deny any staring episodes, stating she is always interactive.  She is currently on Keppra  BID, Dilantin  BID, and Vimpat  BID via G-tube. After the last seizure while still in [place], he reports that she had regressed. She smiles when spoken to, yesterday she was able to say her son's name and that she loves her sons, but easily gets frustrated. She would "huff and puff" and become even more difficult to understand.  She shakes her head yes and no, yesterday she held her son's phone with her left hand.  Around a month ago, she started having right facial twitching that was noted in the office today.  She would also have episodes where she would appear to be gasping for air, even with her trach and O2 on, lasting 15-20 seconds.  She was in the ER 3 days ago for replacement of trach that was displaced, and on their exam, it was noted that the trach site was closed and she was oxygenating well on room air.     PAST MEDICAL HISTORY: Past Medical History  Diagnosis Date  . Hypertension   . SAH (subarachnoid hemorrhage)   . Seizure   . Stroke   . Tracheostomy status   . Tracheomalacia   . DVT (deep venous thrombosis)     PAST SURGICAL HISTORY: Past Surgical History  Procedure Laterality Date  . Abdominal hysterectomy    . Cholecystectomy      MEDICATIONS: Current Outpatient Prescriptions on File Prior to Visit  Medication Sig Dispense Refill  . acetaminophen (TYLENOL) 325 MG tablet Give 650 mg by tube every 4 (four) hours as needed (elevated temperature).      Marland Kitchen albuterol (PROVENTIL) (2.5 MG/3ML) 0.083% nebulizer solution Take 3 mLs (2.5 mg total) by nebulization every 6 (six) hours.  75 mL  12  . Amino Acids-Protein Hydrolys (FEEDING SUPPLEMENT, PRO-STAT SUGAR FREE 64,) LIQD Place 30 mLs into feeding tube daily at 3 pm.  900 mL  0  .  antiseptic oral rinse (BIOTENE) LIQD 15 mLs by Mouth Rinse route 2 times daily at 12 noon and 4 pm.      . chlorhexidine (PERIDEX) 0.12 % solution 15 mLs by Mouth Rinse route 2 (two) times daily.  120 mL  0  . cholestyramine (QUESTRAN) 4 G packet Place 4 g into feeding tube 3 (three) times daily.       . cholestyramine (QUESTRAN) 4 G packet Take 4 g by mouth 3 (three) times daily with meals.      . ciclopirox (PENLAC) 8 % solution Apply 1 application topically at bedtime. Started 06/28/14, for 7 days ending 07/04/14 Apply over nail and surrounding skin. Apply daily over previous coat. After seven (7) days, may remove with alcohol and continue cycle.      . cloNIDine (CATAPRES) 0.2 MG tablet Take 1 tablet (0.2 mg total) by mouth 3 (three) times daily.      . famotidine (PEPCID) 40 MG/5ML suspension Place 2.5 mLs (20 mg total) into feeding tube every 12 (twelve) hours.  50 mL  0  . guaifenesin (ROBAFEN) 100 MG/5ML syrup Give 200 mg by tube every 6 (six) hours as needed for cough.      . lacosamide (VIMPAT) 50 MG TABS tablet Give 100 mg by tube 2 (two) times daily.       Marland Kitchen levETIRAcetam (KEPPRA) 100 MG/ML solution Take 1,500 mg by mouth 2 (two) times daily.      Marland Kitchen levETIRAcetam (KEPPRA) 100 MG/ML solution Take 1,500 mg by mouth 2 (two) times daily.      . metoprolol (LOPRESSOR) 100 MG tablet Place 1 tablet (100 mg total) into feeding tube 2 (two) times daily.      . minoxidil (LONITEN) 10 MG tablet Place 1 tablet (10 mg total) into feeding tube 2 (two) times daily.      . Multiple Vitamins-Minerals (CEROVITE ADVANCED FORMULA) LIQD Give 15 mLs by tube daily at 12 noon.       . Nutritional Supplements (FEEDING SUPPLEMENT, GLUCERNA 1.2 CAL,) LIQD Place 1,000 mLs into feeding tube continuous. Infuse at 50 ml /hr      . phenytoin (DILANTIN) 100 MG/4ML suspension Give 200 mg by tube every 12 (twelve) hours.       . traZODone (DESYREL) 100 MG tablet Give 50 mg by tube at bedtime.       . vitamin C (ASCORBIC  ACID) 500 MG tablet Take 500 mg by mouth daily.      . Water For Irrigation, Sterile (FREE WATER) SOLN Place 200 mLs into feeding tube every 6 (six) hours.      Marland Kitchen zinc sulfate 220 MG capsule Take 220 mg by mouth daily.       No current facility-administered medications on file prior to visit.    ALLERGIES: Allergies  Allergen Reactions  . Amlodipine Swelling  . Penicillins Swelling  . Latex Other (See Comments)    Unknown   . Other Other (See Comments)    Natural Rubber- Unknown  FAMILY HISTORY: Family History  Problem Relation Age of Onset  . Heart disease Mother 81    MI    SOCIAL HISTORY: History   Social History  . Marital Status: Single    Spouse Name: N/A    Number of Children: N/A  . Years of Education: N/A   Occupational History  . Not on file.   Social History Main Topics  . Smoking status: Former Smoker    Types: Cigarettes  . Smokeless tobacco: Never Used     Comment: trying to quit-2 cigarettes daily  . Alcohol Use: No     Comment: Very seldom  . Drug Use: No  . Sexual Activity: Not on file   Other Topics Concern  . Not on file   Social History Narrative  . No narrative on file    REVIEW OF SYSTEMS: Unable to obtain due to non-verbal state, per family, no recent fevers, does not complain of pain, no rash  PHYSICAL EXAM: Filed Vitals:   07/05/14 1441  BP: 150/110  Pulse: 98  Resp: 20   General: No acute distress, lying on stretcher, smiles to people around her, intermittently follows commands. She would have intermittent right lower facial twitching then would smile when spoken to Head:  Normocephalic/atraumatic Eyes: Fundoscopic exam shows bilateral sharp discs, no vessel changes, exudates, or hemorrhages Neck: supple, no paraspinal tenderness, full range of motion Back: No paraspinal tenderness Heart: regular rate and rhythm Lungs: Clear to auscultation bilaterally. Vascular: No carotid bruits. Skin/Extremities: No rash, no  edema Neurological Exam: Mental status: alert, no verbal output in office today, she would intermittently appear agitated as if trying to say something and mouthing words.  Intermittently follows commands to show 2 fingers on left hand, does not do it on right hand Cranial nerves: CN I: not tested CN II: pupils equal, round and reactive to light, visual fields intact, fundi unremarkable. CN III, IV, VI:  full range of motion, no nystagmus, no ptosis CN V: unable to test CN VII: upper and lower face symmetric CN VIII: unable to test CN IX, X: unable to test CN XI: sternocleidomastoid and trapezius muscles intact CN XII: would not open mouth to command Bulk & Tone: increased tone on both UE, flaccid on both LE Motor: at least 3/5 on both UE, 0/5 both LE Sensation: withdraws to pain on both UE, no reaction to nailbed pressure on both LE Deep Tendon Reflexes: +2 on both UE, absent reflexes on both LE, no ankle clonus Plantar responses: mute bilaterally Cerebellar: unable to test Gait: deferred Tremor: none  IMPRESSION: This is an unfortunate 48 year old woman with a 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  Her son reports that since her last seizure in May, she has regressed with communication and motor skills that she had slowly started to regain while in rehab.  MRI brain without contrast will be ordered.  She has been having right facial twitching for the past month, noted in the office today. Routine EEG will be done today.  Continue current doses of Keppra, Dilantin, and Vimpat for now.  Check Dilantin and Keppra levels.  She will be referred for PT and speech therapy/swallow evaluation at the SNF.  She will follow-up in 3 months.  Thank you for allowing me to participate in the care of this patient. Please do not hesitate to call for any questions or concerns.   Patrcia Dolly, M.D.  CC: Dr.  Dayton Martes  Addendum 07/16/2014: Records from [place] reviewed. EEG Report from 02/08/14: At the onset of the recording, there is a left hemisphere periodic discharge with a frequency of 0.3-0.5 Hz. This is superimposed on a rather low amplitude 4-5 Hz background. At least 3 times during the 20-minute recording, this periodicity appears to evolve into a 5-7 Hz discharge predominating in the left temporal region strongly suggestive of electrographic seizures. There are no clinical manifestations reportedly associated with these electrographic discharges. She was on Keppra at that time, the given a load of Vimpat, then started having right facial twitching and loaded with IV Dilantin.

## 2014-07-06 ENCOUNTER — Telehealth: Payer: Self-pay | Admitting: Family Medicine

## 2014-07-06 NOTE — Telephone Encounter (Signed)
[facility] and spoke with [facility] who is taking care of patient. Gave verbal order for Dilantin & Keppra levels to be drawn. She states she has already given patient her morning meds. So she will put in the order for levels to be drawn tomorrow morning before patient is given her meds. She states the results should be back by tomorrow afternoon & I can call to get those levels.

## 2014-07-06 NOTE — Telephone Encounter (Signed)
Message copied by Franciso Bend on Tue Jul 06, 2014  9:22 AM ------      Message from: Van Clines      Created: Mon Jul 05, 2014  5:35 PM      Regarding: pls order labs to be done at [facility]       Can you pls send order to SNF for dilantin and keppra levels to be drawn before her AM doses? Thanks! ------

## 2014-07-07 ENCOUNTER — Encounter (HOSPITAL_COMMUNITY): Payer: Self-pay | Admitting: Emergency Medicine

## 2014-07-07 ENCOUNTER — Emergency Department (HOSPITAL_COMMUNITY): Payer: 59

## 2014-07-07 ENCOUNTER — Emergency Department (HOSPITAL_COMMUNITY)
Admission: EM | Admit: 2014-07-07 | Discharge: 2014-07-07 | Disposition: A | Discharge status: Home / Self Care | Payer: 59 | Attending: Emergency Medicine | Admitting: Emergency Medicine

## 2014-07-07 DIAGNOSIS — Z86718 Personal history of other venous thrombosis and embolism: Secondary | ICD-10-CM | POA: Diagnosis not present

## 2014-07-07 DIAGNOSIS — I10 Essential (primary) hypertension: Secondary | ICD-10-CM | POA: Insufficient documentation

## 2014-07-07 DIAGNOSIS — Z8709 Personal history of other diseases of the respiratory system: Secondary | ICD-10-CM | POA: Insufficient documentation

## 2014-07-07 DIAGNOSIS — Z87891 Personal history of nicotine dependence: Secondary | ICD-10-CM | POA: Diagnosis not present

## 2014-07-07 DIAGNOSIS — Z79899 Other long term (current) drug therapy: Secondary | ICD-10-CM | POA: Diagnosis not present

## 2014-07-07 DIAGNOSIS — IMO0002 Reserved for concepts with insufficient information to code with codable children: Secondary | ICD-10-CM | POA: Insufficient documentation

## 2014-07-07 DIAGNOSIS — W1809XA Striking against other object with subsequent fall, initial encounter: Secondary | ICD-10-CM | POA: Diagnosis not present

## 2014-07-07 DIAGNOSIS — Z930 Tracheostomy status: Secondary | ICD-10-CM | POA: Diagnosis not present

## 2014-07-07 DIAGNOSIS — Z91040 Latex allergy status: Secondary | ICD-10-CM | POA: Diagnosis not present

## 2014-07-07 DIAGNOSIS — Y9389 Activity, other specified: Secondary | ICD-10-CM | POA: Diagnosis not present

## 2014-07-07 DIAGNOSIS — G40909 Epilepsy, unspecified, not intractable, without status epilepticus: Secondary | ICD-10-CM | POA: Diagnosis not present

## 2014-07-07 DIAGNOSIS — Z880 Allergy status to penicillin: Secondary | ICD-10-CM | POA: Insufficient documentation

## 2014-07-07 DIAGNOSIS — W19XXXA Unspecified fall, initial encounter: Secondary | ICD-10-CM

## 2014-07-07 DIAGNOSIS — Y9210 Unspecified residential institution as the place of occurrence of the external cause: Secondary | ICD-10-CM | POA: Insufficient documentation

## 2014-07-07 DIAGNOSIS — S0990XA Unspecified injury of head, initial encounter: Secondary | ICD-10-CM | POA: Diagnosis present

## 2014-07-07 MED ORDER — ACETAMINOPHEN 500 MG PO TABS
1000.0000 mg | ORAL_TABLET | Freq: Once | ORAL | Status: AC
  Administered 2014-07-07: 1000 mg via ORAL
  Filled 2014-07-07: qty 2

## 2014-07-07 NOTE — ED Provider Notes (Signed)
CSN: [id number]     Arrival date & time 07/07/14  1537 History   First MD Initiated Contact with Patient 07/07/14 1603     Chief Complaint  Patient presents with  . Fall    HPI [name] is a 48 y.o. female with PMH of intraoperative rupture during ACOM repair that lead to [facility], b/l ACA stroke, L caudate stroke and resulted in a seizure. Hospitalization complicated by pseudomonas PNA which resulted in acute respiratory failure leading to trach. [name] now has closed and patient and family opted to not recannulate. Patient is nonverbal and resides in nursing home. Per EMS patient was being transferred with a lift, fell and hit the left side of her head on the floor. Report of fall from waist height. Patient complains of HA. Patient not on anticoagulants.    Past Medical History  Diagnosis Date  . Hypertension   . SAH (subarachnoid hemorrhage)   . Seizure   . Stroke   . Tracheostomy status   . Tracheomalacia   . DVT (deep venous thrombosis)    Past Surgical History  Procedure Laterality Date  . Abdominal hysterectomy    . Cholecystectomy     Family History  Problem Relation Age of Onset  . Heart disease Mother 38    MI   History  Substance Use Topics  . Smoking status: Former Smoker    Types: Cigarettes  . Smokeless tobacco: Never Used     Comment: trying to quit-2 cigarettes daily  . Alcohol Use: No     Comment: Very seldom   OB History   Grav Para Term Preterm Abortions TAB SAB Ect Mult Living                 Review of Systems  Unable to perform ROS Pt non verbal    Allergies  Amlodipine; Penicillins; Latex; and Other  Home Medications   Prior to Admission medications   Medication Sig Start Date End Date Taking? Authorizing Provider  acetaminophen (TYLENOL) 325 MG tablet Give 650 mg by tube every 4 (four) hours as needed (elevated temperature).   Yes Historical Provider, MD  albuterol (PROVENTIL) (2.5 MG/3ML) 0.083% nebulizer solution Take 3 mLs (2.5  mg total) by nebulization every 6 (six) hours. 04/07/14  Yes Jeanella Craze, NP  antiseptic oral rinse (BIOTENE) LIQD 15 mLs by Mouth Rinse route 2 times daily at 12 noon and 4 pm. 04/07/14  Yes Jeanella Craze, NP  chlorhexidine (PERIDEX) 0.12 % solution 15 mLs by Mouth Rinse route 2 (two) times daily. 04/07/14  Yes Jeanella Craze, NP  cholestyramine (QUESTRAN) 4 G packet Place 4 g into feeding tube 3 (three) times daily.    Yes Historical Provider, MD  ciclopirox (PENLAC) 8 % solution Apply 1 application topically at bedtime. Started 06/28/14, for 7 days ending 07/04/14 Apply over nail and surrounding skin. Apply daily over previous coat. After seven (7) days, may remove with alcohol and continue cycle.   Yes Historical Provider, MD  cloNIDine (CATAPRES) 0.2 MG tablet Take 1 tablet (0.2 mg total) by mouth 3 (three) times daily. 04/07/14  Yes Jeanella Craze, NP  famotidine (PEPCID) 40 MG/5ML suspension Place 2.5 mLs (20 mg total) into feeding tube every 12 (twelve) hours. 04/07/14  Yes Jeanella Craze, NP  guaifenesin (ROBAFEN) 100 MG/5ML syrup Give 200 mg by tube every 6 (six) hours as needed for cough.   Yes Historical Provider, MD  lacosamide (VIMPAT) 50 MG TABS tablet  Give 100 mg by tube 2 (two) times daily.    Yes Historical Provider, MD  levETIRAcetam (KEPPRA) 100 MG/ML solution Take 1,500 mg by mouth 2 (two) times daily.   Yes Historical Provider, MD  metoprolol (LOPRESSOR) 100 MG tablet Place 1 tablet (100 mg total) into feeding tube 2 (two) times daily. 04/07/14  Yes Jeanella Craze, NP  minoxidil (LONITEN) 10 MG tablet Place 1 tablet (10 mg total) into feeding tube 2 (two) times daily. 04/07/14  Yes Jeanella Craze, NP  Multiple Vitamins-Minerals (CEROVITE ADVANCED FORMULA) LIQD Give 15 mLs by tube daily at 12 noon.    Yes Historical Provider, MD  Nutritional Supplements (FEEDING SUPPLEMENT, GLUCERNA 1.2 CAL,) LIQD Place 1,000 mLs into feeding tube continuous. Infuse at 50 ml /hr 04/07/14  Yes Jeanella Craze, NP   phenytoin (DILANTIN) 100 MG/4ML suspension Give 200 mg by tube every 12 (twelve) hours.    Yes Historical Provider, MD  traZODone (DESYREL) 100 MG tablet Give 50 mg by tube at bedtime.    Yes Historical Provider, MD  vitamin C (ASCORBIC ACID) 500 MG tablet 500 mg by PEG Tube route 2 (two) times daily.    Yes Historical Provider, MD  Water For Irrigation, Sterile (FREE WATER) SOLN Place 200 mLs into feeding tube every 6 (six) hours. 04/07/14  Yes Jeanella Craze, NP   BP 150/92  Pulse 79  Temp(Src) 98.8 F (37.1 C) (Oral)  Resp 31  SpO2 96% Physical Exam  Nursing note and vitals reviewed. Constitutional: She appears well-developed and well-nourished. No distress.  HENT:  Head: Normocephalic.  6 cm superficial abrasion to left side of face. 1cm superficial abrasion behind left ear. Bleeding controlled.  Eyes: Conjunctivae and EOM are normal. Pupils are equal, round, and reactive to light. Right eye exhibits no discharge. Left eye exhibits no discharge. No scleral icterus.  Neck: Neck supple.  Cardiovascular: Normal rate, regular rhythm and normal heart sounds.   Pulmonary/Chest: Effort normal and breath sounds normal. No respiratory distress. She has no wheezes.  Abdominal: Soft. Bowel sounds are normal. She exhibits no distension. There is no tenderness.  Musculoskeletal:  No point tenderness to spine or right left sided back or upper extremities. Patient moves all fingers. Legs flaccid. All extremities without lesions.  Neurological: She is alert. She exhibits normal muscle tone.  Skin: Skin is warm and dry. She is not diaphoretic.  Psychiatric: She has a normal mood and affect. Her behavior is normal.  Patient is nonverbal. She nods yes and no to simple questions.    ED Course  Procedures (including critical care time) Labs Review Labs Reviewed - No data to display  Imaging Review Ct Head Wo Contrast  07/07/2014   CLINICAL DATA:  Trauma.  Neck pain  EXAM: CT HEAD WITHOUT CONTRAST   CT CERVICAL SPINE WITHOUT CONTRAST  TECHNIQUE: Multidetector CT imaging of the head and cervical spine was performed following the standard protocol without intravenous contrast. Multiplanar CT image reconstructions of the cervical spine were also generated.  COMPARISON:  None.  FINDINGS: CT HEAD FINDINGS  Aneurysm clip is identified in the region of the anterior cerebral arteries. There is bilateral frontal lobe encephalomalacia compatible with prior anterior cerebral artery infarcts. A left frontoparietal craniotomy has been performed. There is no evidence for acute brain infarct, hemorrhage or mass. The paranasal sinuses are clear. The mastoid air cells are clear. No acute skull fracture identified.  CT CERVICAL SPINE FINDINGS  Straightening of normal cervical lordosis is  noted. The vertebral body heights are well preserved. There is no evidence for cervical spine fracture. The facet joints are aligned. There is disc space narrowing and ventral endplate spurring at C5-6. The prevertebral soft tissue space appears within normal limits. Nodule in the right lobe of thyroid gland measures 1.3 cm.  IMPRESSION: 1. No acute intracranial abnormalities. 2. Chronic, bifrontal encephalomalacia and changes from aneurysm clipping. 3. Straightening of normal cervical lordosis which may be due to muscle spasm per patient positioning. No evidence for cervical spine fracture or dislocation. 4. Nodule in right lobe of thyroid gland. Consider further evaluation with thyroid ultrasound. If patient is clinically hyperthyroid, consider nuclear medicine thyroid uptake and scan.   Electronically Signed   By: Signa Kell M.D.   On: 07/07/2014 18:39   Ct Cervical Spine Wo Contrast  07/07/2014   CLINICAL DATA:  Trauma.  Neck pain  EXAM: CT HEAD WITHOUT CONTRAST  CT CERVICAL SPINE WITHOUT CONTRAST  TECHNIQUE: Multidetector CT imaging of the head and cervical spine was performed following the standard protocol without intravenous  contrast. Multiplanar CT image reconstructions of the cervical spine were also generated.  COMPARISON:  None.  FINDINGS: CT HEAD FINDINGS  Aneurysm clip is identified in the region of the anterior cerebral arteries. There is bilateral frontal lobe encephalomalacia compatible with prior anterior cerebral artery infarcts. A left frontoparietal craniotomy has been performed. There is no evidence for acute brain infarct, hemorrhage or mass. The paranasal sinuses are clear. The mastoid air cells are clear. No acute skull fracture identified.  CT CERVICAL SPINE FINDINGS  Straightening of normal cervical lordosis is noted. The vertebral body heights are well preserved. There is no evidence for cervical spine fracture. The facet joints are aligned. There is disc space narrowing and ventral endplate spurring at C5-6. The prevertebral soft tissue space appears within normal limits. Nodule in the right lobe of thyroid gland measures 1.3 cm.  IMPRESSION: 1. No acute intracranial abnormalities. 2. Chronic, bifrontal encephalomalacia and changes from aneurysm clipping. 3. Straightening of normal cervical lordosis which may be due to muscle spasm per patient positioning. No evidence for cervical spine fracture or dislocation. 4. Nodule in right lobe of thyroid gland. Consider further evaluation with thyroid ultrasound. If patient is clinically hyperthyroid, consider nuclear medicine thyroid uptake and scan.   Electronically Signed   By: Signa Kell M.D.   On: 07/07/2014 18:39     EKG Interpretation None     Meds given in ED:  Medications  acetaminophen (TYLENOL) tablet 1,000 mg (1,000 mg Oral Given 07/07/14 1755)    New Prescriptions   No medications on file      MDM   Final diagnoses:  Fall, initial encounter  Head injury, initial encounter   [name] is a 48 y.o. female, nonverbal with PMH of SAH presenting after a fall from a lift in a SNF. Patient complains of HA. VSS. Patient without facial  droop and is mentating at baseline with no abnormal behaviors per family. CT head and C-spine without signs of hemorrhage. Patient's superficial head wounds irrigated, bacitracin ointment and dressed. Patient given acetaminophen with improvement of HA. Patient without other complaints. Patient is afebrile, nontoxic, and in no acute distress. Patient is appropriate for outpatient management and is stable for discharge to SNF.  Discussed return precautions with patient. Discussed all results and patient verbalizes understanding and agrees with plan.  This is a shared patient. This patient was discussed with the physician, Dr. Radford Pax who saw  and evaluated the patient.      Louann Sjogren, PA-C 07/07/14 2014

## 2014-07-07 NOTE — ED Notes (Signed)
Patient transported to CT 

## 2014-07-07 NOTE — Discharge Instructions (Signed)
Return to the emergency room with worsening of symptoms, new symptoms or with symptoms that are concerning. Please call your doctor for a followup appointment within 24-48 hours. When you talk to your doctor please let them know that you were seen in the emergency department and have them acquire all of your records so that they can discuss the findings with you and formulate a treatment plan to fully care for your new and ongoing problems.   Concussion A concussion, or closed-head injury, is a brain injury caused by a direct blow to the head or by a quick and sudden movement (jolt) of the head or neck. Concussions are usually not life-threatening. Even so, the effects of a concussion can be serious. If you have had a concussion before, you are more likely to experience concussion-like symptoms after a direct blow to the head.  CAUSES  Direct blow to the head, such as from running into another player during a soccer game, being hit in a fight, or hitting your head on a hard surface.  A jolt of the head or neck that causes the brain to move back and forth inside the skull, such as in a car crash. SIGNS AND SYMPTOMS The signs of a concussion can be hard to notice. Early on, they may be missed by you, family members, and health care providers. You may look fine but act or feel differently. Symptoms are usually temporary, but they may last for days, weeks, or even longer. Some symptoms may appear right away while others may not show up for hours or days. Every head injury is different. Symptoms include:  Mild to moderate headaches that will not go away.  A feeling of pressure inside your head.  Having more trouble than usual:  Learning or remembering things you have heard.  Answering questions.  Paying attention or concentrating.  Organizing daily tasks.  Making decisions and solving problems.  Slowness in thinking, acting or reacting, speaking, or reading.  Getting lost or being easily  confused.  Feeling tired all the time or lacking energy (fatigued).  Feeling drowsy.  Sleep disturbances.  Sleeping more than usual.  Sleeping less than usual.  Trouble falling asleep.  Trouble sleeping (insomnia).  Loss of balance or feeling lightheaded or dizzy.  Nausea or vomiting.  Numbness or tingling.  Increased sensitivity to:  Sounds.  Lights.  Distractions.  Vision problems or eyes that tire easily.  Diminished sense of taste or smell.  Ringing in the ears.  Mood changes such as feeling sad or anxious.  Becoming easily irritated or angry for little or no reason.  Lack of motivation.  Seeing or hearing things other people do not see or hear (hallucinations). DIAGNOSIS Your health care provider can usually diagnose a concussion based on a description of your injury and symptoms. He or she will ask whether you passed out (lost consciousness) and whether you are having trouble remembering events that happened right before and during your injury. Your evaluation might include:  A brain scan to look for signs of injury to the brain. Even if the test shows no injury, you may still have a concussion.  Blood tests to be sure other problems are not present. TREATMENT  Concussions are usually treated in an emergency department, in urgent care, or at a clinic. You may need to stay in the hospital overnight for further treatment.  Tell your health care provider if you are taking any medicines, including prescription medicines, over-the-counter medicines, and natural remedies.  Some medicines, such as blood thinners (anticoagulants) and aspirin, may increase the chance of complications. Also tell your health care provider whether you have had alcohol or are taking illegal drugs. This information may affect treatment.  Your health care provider will send you home with important instructions to follow.  How fast you will recover from a concussion depends on many  factors. These factors include how severe your concussion is, what part of your brain was injured, your age, and how healthy you were before the concussion.  Most people with mild injuries recover fully. Recovery can take time. In general, recovery is slower in older persons. Also, persons who have had a concussion in the past or have other medical problems may find that it takes longer to recover from their current injury. HOME CARE INSTRUCTIONS General Instructions  Carefully follow the directions your health care provider gave you.  Only take over-the-counter or prescription medicines for pain, discomfort, or fever as directed by your health care provider.  Take only those medicines that your health care provider has approved.  Do not drink alcohol until your health care provider says you are well enough to do so. Alcohol and certain other drugs may slow your recovery and can put you at risk of further injury.  If it is harder than usual to remember things, write them down.  If you are easily distracted, try to do one thing at a time. For example, do not try to watch TV while fixing dinner.  Talk with family members or close friends when making important decisions.  Keep all follow-up appointments. Repeated evaluation of your symptoms is recommended for your recovery.  Watch your symptoms and tell others to do the same. Complications sometimes occur after a concussion. Older adults with a brain injury may have a higher risk of serious complications, such as a blood clot on the brain.  Tell your teachers, school nurse, school counselor, coach, athletic trainer, or work Production designer, theatre/television/filmmanager about your injury, symptoms, and restrictions. Tell them about what you can or cannot do. They should watch for:  Increased problems with attention or concentration.  Increased difficulty remembering or learning new information.  Increased time needed to complete tasks or assignments.  Increased irritability  or decreased ability to cope with stress.  Increased symptoms.  Rest. Rest helps the brain to heal. Make sure you:  Get plenty of sleep at night. Avoid staying up late at night.  Keep the same bedtime hours on weekends and weekdays.  Rest during the day. Take daytime naps or rest breaks when you feel tired.  Limit activities that require a lot of thought or concentration. These include:  Doing homework or job-related work.  Watching TV.  Working on the computer.  Avoid any situation where there is potential for another head injury (football, hockey, soccer, basketball, martial arts, downhill snow sports and horseback riding). Your condition will get worse every time you experience a concussion. You should avoid these activities until you are evaluated by the appropriate follow-up health care providers. Returning To Your Regular Activities You will need to return to your normal activities slowly, not all at once. You must give your body and brain enough time for recovery.  Do not return to sports or other athletic activities until your health care provider tells you it is safe to do so.  Ask your health care provider when you can drive, ride a bicycle, or operate heavy machinery. Your ability to react may be slower after a  brain injury. Never do these activities if you are dizzy.  Ask your health care provider about when you can return to work or school. Preventing Another Concussion It is very important to avoid another brain injury, especially before you have recovered. In rare cases, another injury can lead to permanent brain damage, brain swelling, or death. The risk of this is greatest during the first 7-10 days after a head injury. Avoid injuries by:  Wearing a seat belt when riding in a car.  Drinking alcohol only in moderation.  Wearing a helmet when biking, skiing, skateboarding, skating, or doing similar activities.  Avoiding activities that could lead to a second  concussion, such as contact or recreational sports, until your health care provider says it is okay.  Taking safety measures in your home.  Remove clutter and tripping hazards from floors and stairways.  Use grab bars in bathrooms and handrails by stairs.  Place non-slip mats on floors and in bathtubs.  Improve lighting in dim areas. SEEK MEDICAL CARE IF:  You have increased problems paying attention or concentrating.  You have increased difficulty remembering or learning new information.  You need more time to complete tasks or assignments than before.  You have increased irritability or decreased ability to cope with stress.  You have more symptoms than before. Seek medical care if you have any of the following symptoms for more than 2 weeks after your injury:  Lasting (chronic) headaches.  Dizziness or balance problems.  Nausea.  Vision problems.  Increased sensitivity to noise or light.  Depression or mood swings.  Anxiety or irritability.  Memory problems.  Difficulty concentrating or paying attention.  Sleep problems.  Feeling tired all the time. SEEK IMMEDIATE MEDICAL CARE IF:  You have severe or worsening headaches. These may be a sign of a blood clot in the brain.  You have weakness (even if only in one hand, leg, or part of the face).  You have numbness.  You have decreased coordination.  You vomit repeatedly.  You have increased sleepiness.  One pupil is larger than the other.  You have convulsions.  You have slurred speech.  You have increased confusion. This may be a sign of a blood clot in the brain.  You have increased restlessness, agitation, or irritability.  You are unable to recognize people or places.  You have neck pain.  It is difficult to wake you up.  You have unusual behavior changes.  You lose consciousness. MAKE SURE YOU:  Understand these instructions.  Will watch your condition.  Will get help right away  if you are not doing well or get worse. Document Released: 01/12/2004 Document Revised: 10/27/2013 Document Reviewed: 05/14/2013 [facility] Patient Information 2015 [place], [place]. This information is not intended to replace advice given to you by your health care provider. Make sure you discuss any questions you have with your health care provider.

## 2014-07-07 NOTE — ED Notes (Signed)
Per EMS- Pt was being transferred via a lift, and fell and hit the left side of her head on the floor. Pt has a hx of subarachnoid hemorrhage and is non verbal, however she tried to speak. Pt can move fingers. Both legs are flaccid. C spine collar in place upon arrival.

## 2014-07-07 NOTE — ED Provider Notes (Signed)
Medical screening examination/treatment/procedure(s) were performed by non-physician practitioner and as supervising physician I was immediately available for consultation/collaboration.    Nelia Shi, MD 07/07/14 2024

## 2014-07-13 ENCOUNTER — Telehealth: Payer: Self-pay | Admitting: Neurology

## 2014-07-13 NOTE — Telephone Encounter (Signed)
I did speak with [name] @ [facility]. She wanted to know if the patient had any aneurysm clips. I told her that I didn't know that right off hand but I would check with Dr. Karel Jarvis to see if she had knowledge of this & get back with her. I also gave her the name and number of patient's son [name] who is also POA to see if he could be help with this information.   I did speak with Dr. Karel Jarvis about this and the  Patient does have clips, she also states that patient has had previous MRI with clips in place.  I did call [name] back, I didn't get an answer but I did leave this information on her voicemail & asked her to call me back if any further questions.

## 2014-07-13 NOTE — Telephone Encounter (Signed)
Please call [facility] regarding aneurysm clips. Pt has MRI tomorrow. CB# [phone] / Sherri S.

## 2014-07-13 NOTE — Procedures (Signed)
ELECTROENCEPHALOGRAM REPORT  Date of Study: 07/05/2014  Patient's Name: [name] MRN: [id number] Date of Birth: 16-Jan-1966  Referring Provider: Dr. 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 Her son reports that since her last seizure in May, she has regressed with communication and motor skills that she had slowly started to regain while in rehab.  Medications: Keppra, Dilantin, Vimpat, Trazodone  Technical Summary: A multichannel digital EEG recording measured by the international 10-20 system with electrodes applied with paste and impedances below 5000 ohms performed in our laboratory with EKG monitoring in an awake  patient.  Hyperventilation and photic stimulation were not performed.  The digital EEG was referentially recorded, reformatted, and digitally filtered in a variety of bipolar and referential montages for optimal display.    Description: The patient is awake during the recording.  During maximal wakefulness, there is a symmetric, medium voltage 10 Hz posterior dominant rhythm that attenuates with eye opening.  The record is symmetric.  She becomes restless and agitated 4 times during the study, with hyperventilation and nodding head movements, with no associated epileptiform correlate.  There were no epileptiform discharges or electrographic seizures seen.    EKG lead showed sinus tachycardia.  Impression: This awake EEG is normal.  Four episodes of restlessness with hyperventilation and head nodding did not show any epileptiform correlate.  Clinical Correlation: A normal EEG does not exclude a clinical diagnosis of epilepsy.  If further clinical questions remain, prolonged EEG may be helpful.  Clinical correlation is advised.   Patrcia Dolly, M.D.

## 2014-07-14 ENCOUNTER — Other Ambulatory Visit: Payer: 59

## 2014-07-15 ENCOUNTER — Inpatient Hospital Stay
Admission: RE | Admit: 2014-07-15 | Discharge: 2014-07-15 | Disposition: A | Discharge status: Home / Self Care | Payer: 59 | Source: Ambulatory Visit | Attending: Neurology | Admitting: Neurology

## 2014-07-26 ENCOUNTER — Other Ambulatory Visit: Payer: 59

## 2014-07-27 ENCOUNTER — Ambulatory Visit
Admission: RE | Admit: 2014-07-27 | Discharge: 2014-07-27 | Disposition: A | Discharge status: Home / Self Care | Payer: 59 | Source: Ambulatory Visit | Attending: Neurology | Admitting: Neurology

## 2014-07-29 ENCOUNTER — Telehealth: Payer: Self-pay | Admitting: Neurology

## 2014-07-29 NOTE — Telephone Encounter (Signed)
Pt's son called requesting a referral for her to see another provider in [place], [place] Please call him # [phone]

## 2014-07-29 NOTE — Telephone Encounter (Signed)
Pt's son called requesting to speak to a nurse regarding a referral. C/B [phone]

## 2014-07-30 NOTE — Telephone Encounter (Signed)
Lmom to return my call. 

## 2014-07-30 NOTE — Telephone Encounter (Signed)
Torrie-Pt's son called/returning your call at 3:07PM. C/B [phone]

## 2014-07-30 NOTE — Telephone Encounter (Signed)
Discussed EEG, normal. Prior EEG in [place] had shown left hemisphere PLEDs after she had a seizure.  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 Continue current AEDs at this time. Family would like her to return to [facility], needs letter. She had made significant gains at [facility], then had seizure and regressed with these gains. She would be a good candidate to try to regain these with therapy at [facility].  Tiffany, son will call with number to send letter, who to address to. Thanks

## 2014-07-30 NOTE — Telephone Encounter (Signed)
I do have fax number. I will fax letter when it's ready. He didn't give a name just stated that it needed to go to [facility].

## 2014-07-30 NOTE — Telephone Encounter (Signed)
Spoke with pts son/Torrie. He was wanting EEG results. Also wants a referral letter for patient to go back to [facility] in [place].

## 2014-09-15 ENCOUNTER — Other Ambulatory Visit (HOSPITAL_COMMUNITY): Payer: Self-pay | Admitting: Internal Medicine

## 2014-09-15 DIAGNOSIS — R1310 Dysphagia, unspecified: Secondary | ICD-10-CM

## 2014-09-16 ENCOUNTER — Other Ambulatory Visit (HOSPITAL_COMMUNITY): Payer: Self-pay | Admitting: Internal Medicine

## 2014-09-16 DIAGNOSIS — Z431 Encounter for attention to gastrostomy: Secondary | ICD-10-CM

## 2014-09-16 DIAGNOSIS — I6300 Cerebral infarction due to thrombosis of unspecified precerebral artery: Secondary | ICD-10-CM

## 2014-09-20 ENCOUNTER — Ambulatory Visit (HOSPITAL_COMMUNITY): Payer: 59

## 2014-09-22 ENCOUNTER — Ambulatory Visit (HOSPITAL_COMMUNITY)
Admission: RE | Admit: 2014-09-22 | Discharge: 2014-09-22 | Disposition: A | Discharge status: Home / Self Care | Payer: 59 | Source: Ambulatory Visit | Attending: Internal Medicine | Admitting: Internal Medicine

## 2014-09-22 DIAGNOSIS — R1311 Dysphagia, oral phase: Secondary | ICD-10-CM | POA: Diagnosis not present

## 2014-09-22 DIAGNOSIS — R569 Unspecified convulsions: Secondary | ICD-10-CM | POA: Insufficient documentation

## 2014-09-22 DIAGNOSIS — I609 Nontraumatic subarachnoid hemorrhage, unspecified: Secondary | ICD-10-CM | POA: Insufficient documentation

## 2014-09-22 DIAGNOSIS — J398 Other specified diseases of upper respiratory tract: Secondary | ICD-10-CM | POA: Diagnosis not present

## 2014-09-22 DIAGNOSIS — I6300 Cerebral infarction due to thrombosis of unspecified precerebral artery: Secondary | ICD-10-CM | POA: Insufficient documentation

## 2014-09-22 DIAGNOSIS — Z930 Tracheostomy status: Secondary | ICD-10-CM | POA: Insufficient documentation

## 2014-09-22 DIAGNOSIS — R1310 Dysphagia, unspecified: Secondary | ICD-10-CM | POA: Diagnosis present

## 2014-09-22 DIAGNOSIS — I639 Cerebral infarction, unspecified: Secondary | ICD-10-CM | POA: Insufficient documentation

## 2014-09-22 DIAGNOSIS — Z86718 Personal history of other venous thrombosis and embolism: Secondary | ICD-10-CM | POA: Insufficient documentation

## 2014-09-22 DIAGNOSIS — I10 Essential (primary) hypertension: Secondary | ICD-10-CM | POA: Insufficient documentation

## 2014-09-22 DIAGNOSIS — Z431 Encounter for attention to gastrostomy: Secondary | ICD-10-CM | POA: Diagnosis present

## 2014-09-22 DIAGNOSIS — R1313 Dysphagia, pharyngeal phase: Secondary | ICD-10-CM | POA: Diagnosis not present

## 2014-09-22 NOTE — Procedures (Signed)
Objective Swallowing Evaluation: Modified Barium Swallowing Study  Patient Details  Name: Meghan PoreLaura Marie Lawson MRN: [id number] Date of Birth: 03/09/1966  Today's Date: 09/22/2014 Time: 1140-1220 SLP Time Calculation (min) (ACUTE ONLY): 40 min  Past Medical History:  Past Medical History  Diagnosis Date  . Hypertension   . SAH (subarachnoid hemorrhage)   . Seizure   . Stroke   . Tracheostomy status   . Tracheomalacia   . DVT (deep venous thrombosis)    Past Surgical History:  Past Surgical History  Procedure Laterality Date  . Abdominal hysterectomy    . Cholecystectomy     HPI:  48 yr old seen for outpatient MBS accompanied by sister and son for potential recommendation for po's.  Pt. s/p aneurysm, CVA, trach and PEG 12/14.     Assessment / Plan / Recommendation Clinical Impression  Dysphagia Diagnosis: Mild oral phase dysphagia;Mild pharyngeal phase dysphagia;Moderate pharyngeal phase dysphagia Clinical impression: Pt. exhibited mild oral dysphagia with a piecemeal transit pattern due to decreased strength control and manipulation. Mild-moderate sensory based pharyngeal dysphagia resulting in intermittent delayed swallow initiation wiith aspiration during the swallow of thin barium.  Reflexive cough elicited, however not effective in clearing aspirates.  Tongue base retraction and pharyngeal contraction intact without pharyngeal residuals observed.  Oral prep, mastication and transit of solid texture was overall functional during study, however recommend pt. initiate a mechanical soft texture and nectar thick liquids, no straws, meds whole in applesauce and full supervision. Verbal and visual education provided for family regarding explanation of study and recommendations.        Treatment Recommendation  Defer treatment plan to SLP at (Comment)    Diet Recommendation Dysphagia 3 (Mechanical Soft);Nectar-thick liquid   Liquid Administration via: Cup;No straw Medication  Administration: Whole meds with puree Supervision: Patient able to self feed;Full supervision/cueing for compensatory strategies Compensations: Slow rate;Small sips/bites Postural Changes and/or Swallow Maneuvers: Seated upright 90 degrees    Other  Recommendations Oral Care Recommendations: Oral care BID   Follow Up Recommendations  Skilled Nursing facility    Frequency and Duration        Pertinent Vitals/Pain No pain            Reason for Referral Objectively evaluate swallowing function   Oral Phase Oral Preparation/Oral Phase Oral Phase: Impaired Oral - Honey Oral - Honey Teaspoon: Piecemeal swallowing Oral - Honey Cup: Piecemeal swallowing Oral - Nectar Oral - Nectar Teaspoon: Within functional limits Oral - Nectar Cup: Piecemeal swallowing Oral - Solids Oral - Puree: Within functional limits Oral - Regular: Delayed oral transit (mild)   Pharyngeal Phase Pharyngeal Phase Pharyngeal Phase: Impaired Pharyngeal - Honey Pharyngeal - Honey Teaspoon: Delayed swallow initiation;Premature spillage to valleculae;Penetration/Aspiration during swallow Penetration/Aspiration details (honey teaspoon): Material enters airway, remains ABOVE vocal cords then ejected out Pharyngeal - Honey Cup: Delayed swallow initiation;Premature spillage to valleculae Pharyngeal - Nectar Pharyngeal - Nectar Teaspoon: Delayed swallow initiation;Premature spillage to valleculae Pharyngeal - Nectar Cup: Within functional limits Pharyngeal - Thin Pharyngeal - Thin Teaspoon: Penetration/Aspiration during swallow;Reduced laryngeal elevation;Reduced airway/laryngeal closure;Delayed swallow initiation;Premature spillage to valleculae Penetration/Aspiration details (thin teaspoon): Material enters airway, passes BELOW cords and not ejected out despite cough attempt by patient Pharyngeal - Solids Pharyngeal - Puree: Within functional limits Pharyngeal - Regular: Within functional limits  Cervical  Esophageal Phase    GO    Cervical Esophageal Phase Cervical Esophageal Phase: [facility]    Functional Assessment Tool Used: skilled clinical judgement Functional Limitations: Swallowing Swallow Current Status ([id number](G8996):  At least 40 percent but less than 60 percent impaired, limited or restricted Swallow Goal Status [phone](G8997): At least 40 percent but less than 60 percent impaired, limited or restricted Swallow Discharge Status [phone](G8998): At least 40 percent but less than 60 percent impaired, limited or restricted    Royce MacadamiaLitaker, Larra Crunkleton Willis 09/22/2014, 3:45 PM  Breck CoonsLisa Willis Lonell FaceLitaker M.Ed [employer] Pager [phone]

## 2014-10-05 ENCOUNTER — Encounter: Payer: Self-pay | Admitting: Neurology

## 2014-10-05 ENCOUNTER — Ambulatory Visit (INDEPENDENT_AMBULATORY_CARE_PROVIDER_SITE_OTHER): Payer: 59 | Admitting: Neurology

## 2014-10-05 VITALS — BP 114/74 | HR 90 | Resp 18

## 2014-10-05 DIAGNOSIS — Z8673 Personal history of transient ischemic attack (TIA), and cerebral infarction without residual deficits: Secondary | ICD-10-CM

## 2014-10-05 DIAGNOSIS — G40209 Localization-related (focal) (partial) symptomatic epilepsy and epileptic syndromes with complex partial seizures, not intractable, without status epilepticus: Secondary | ICD-10-CM

## 2014-10-05 NOTE — Patient Instructions (Signed)
1. Continue all your current medications 2. She will benefit from physical therapy and speech therapy 3. Follow-up in 3 months

## 2014-10-06 ENCOUNTER — Encounter: Payer: Self-pay | Admitting: Neurology

## 2014-10-06 NOTE — Progress Notes (Signed)
NEUROLOGY FOLLOW UP OFFICE NOTE  [name] [id number]  HISTORY OF PRESENT ILLNESS: I had the pleasure of seeing [name] in follow-up in the neurology clinic on 10/05/2014.  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 She is currently on Keppra, Vimpat, and Dilantin, with no further seizures since May 2015. Records and images were personally reviewed where available.  I personally reviewed MRI brain without contrast which showed remote large medial frontal and parietal lobe infarcts with encephalomalacia and laminar necrosis, prior left frontal temporal craniotomy, encephalomalacia in the anterior left frontal lobe and left temporal lobe.  Her EEG from [place] had shown periodic discharges from the left hemisphere. Repeat EEG done in our office was normal. She had episodes of hyperventilation and restlessness which did not show any EEG changes. Her family is frustrated with her current situation/care, she apparently started doing better while in rehab in [place], but regressed after the last seizure. At this time, she is not receiving any PT or speech therapy but has passed the recent swallow [id number] was complicated by intraoperative seizures despite keppra load, ativan, and propofol so she was loaded with fosphenytoin. She also had intraoperative hemorrhage. Imaging post op showed infarct extension with bilateral ACA infarcts and left sided caudate infarct. When she was discharged to rehab, she was unable to move all her extremities with note of bilateral hand contractures, non-verbal, tracks. Her son reports that she was moved to a rehab center in [place], where she did quite well, she could  grasp balls, state where she was born, where she was at, and could take sips of water. He reports that she had a seizure in February, April, and most recently in May 2015. The son only witnessed one of the seizures where she was biting down with head turn to the left. They deny any staring episodes, stating she is always interactive. She is currently on Keppra 1500mg  BID, Dilantin 250mg  BID, and Vimpat 100mg  BID via G-tube. After the last seizure while still in [place], he reports that she had regressed. She smiles when spoken to, easily gets frustrated. She would "huff and puff" and become even more difficult to understand. She shakes her head yes and no.  PAST MEDICAL HISTORY: Past Medical History  Diagnosis Date  . Hypertension   . SAH (subarachnoid hemorrhage)   . Seizure   . Stroke   . Tracheostomy status   . Tracheomalacia   . DVT (deep venous thrombosis)     MEDICATIONS: Current Outpatient Prescriptions on File Prior to Visit  Medication Sig Dispense Refill  . acetaminophen (TYLENOL) 325 MG tablet Give 650 mg by tube every 4 (four) hours as needed (elevated temperature).    . albuterol (PROVENTIL) (2.5 MG/3ML) 0.083% nebulizer solution Take 3 mLs (2.5 mg total) by nebulization every 6 (six) hours. 75 mL 12  . antiseptic oral rinse (BIOTENE) LIQD 15 mLs by Mouth Rinse route 2 times daily at 12 noon and 4 pm.    . chlorhexidine (PERIDEX) 0.12 % solution 15 mLs by Mouth Rinse route 2 (two) times daily. 120 mL 0  . cholestyramine (QUESTRAN) 4 G packet Place 4 g into feeding tube 3 (three) times daily.     .  cloNIDine (CATAPRES) 0.2 MG tablet Take 1 tablet (0.2 mg total) by mouth 3 (three) times daily.    . famotidine (PEPCID) 40 MG/5ML suspension Place 2.5 mLs (20 mg total) into feeding tube every 12 (twelve) hours. 50 mL 0  . guaifenesin (ROBAFEN) 100 MG/5ML syrup Give 200 mg by tube every 6 (six) hours as needed for cough.    . lacosamide (VIMPAT) 50 MG TABS tablet Give 100 mg by tube  2 (two) times daily.     Marland Kitchen levETIRAcetam (KEPPRA) 100 MG/ML solution Take 1,500 mg by mouth 2 (two) times daily.    . metoprolol (LOPRESSOR) 100 MG tablet Place 1 tablet (100 mg total) into feeding tube 2 (two) times daily.    . minoxidil (LONITEN) 10 MG tablet Place 1 tablet (10 mg total) into feeding tube 2 (two) times daily.    . Nutritional Supplements (FEEDING SUPPLEMENT, GLUCERNA 1.2 CAL,) LIQD Place 1,000 mLs into feeding tube continuous. Infuse at 50 ml /hr    . vitamin C (ASCORBIC ACID) 500 MG tablet 500 mg by PEG Tube route 2 (two) times daily.     . Water For Irrigation, Sterile (FREE WATER) SOLN Place 200 mLs into feeding tube every 6 (six) hours.     No current facility-administered medications on file prior to visit.    ALLERGIES: Allergies  Allergen Reactions  . Amlodipine Swelling  . Penicillins Swelling  . Latex Other (See Comments)    Unknown   . Other Other (See Comments)    Natural Rubber- Unknown     FAMILY HISTORY: Family History  Problem Relation Age of Onset  . Heart disease Mother 7    MI    SOCIAL HISTORY: History   Social History  . Marital Status: Single    Spouse Name: N/A    Number of Children: N/A  . Years of Education: N/A   Occupational History  . Not on file.   Social History Main Topics  . Smoking status: Former Smoker    Types: Cigarettes  . Smokeless tobacco: Never Used     Comment: trying to quit-2 cigarettes daily  . Alcohol Use: No     Comment: Very seldom  . Drug Use: No  . Sexual Activity: Not on file   Other Topics Concern  . Not on file   Social History Narrative    REVIEW OF SYSTEMS unable to obtain due to non-verbal state.   PHYSICAL EXAM: Filed Vitals:   10/05/14 1431  BP: 114/74  Pulse: 90  Resp: 18   General:No acute distress, lying on stretcher, smiles, nods or shakes her head to questions.  Head: Normocephalic/atraumatic Eyes: Fundoscopic exam shows bilateral sharp discs, no vessel changes,  exudates, or hemorrhages Neck: supple, no paraspinal tenderness, full range of motion Back: No paraspinal tenderness Heart: regular rate and rhythm Lungs: Clear to auscultation bilaterally. Vascular: No carotid bruits. Skin/Extremities: No rash, no edema Neurological Exam: Mental status: alert, non-verbal, does not follow commands except to try to show 2 fingers on the left hand. Cranial nerves: CN I: not tested CN II: pupils equal, round and reactive to light, visual fields intact, fundi unremarkable. CN III, IV, VI: full range of motion, no nystagmus, no ptosis CN V: unable to test CN VII: upper and lower face symmetric CN VIII: unable to test CN IX, X: unable to test CN XI: sternocleidomastoid and trapezius muscles intact CN XII: would not open mouth to command Bulk & Tone: increased tone on both UE, flaccid  on both LE (unchanged) Motor: at least 3/5 on both UE, 0/5 both LE but slight withdrawal to pain Sensation: withdraws to pain on both UE, slight withdrawal nailbed pressure, L>R Deep Tendon Reflexes: +2 on both UE, absent reflexes on both LE, no ankle clonus Plantar responses: mute bilaterally Cerebellar: unable to test Gait: deferred Tremor: none  IMPRESSION: This is an unfortunate 48 yo woman with a 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 Her son reports that since her last seizure in May, she has regressed with communication and motor skills that she had slowly started to regain while in rehab in [place]. MRI brain did not show any acute changes, there was encephalomalacia in the bilateral ACA territories, and left frontal and temporal regions, which would account for the bilateral leg weakness and aphasia. Findings were discussed with her family. Recent EEG normal, continue current medications for now. We may plan to taper off Dilantin in the future. Her family is interested in  transferring her back to [facility] [place], where she was able to regain some movement and speech. She may benefit from physical therapy (passive movements to prevent contractures) and speech therapy. She will follow-up in 3 months.  Thank you for allowing me to participate in the care of this patient. Please do not hesitate to call for any questions or concerns.  The duration of this appointment visit was 15 minutes of face-to-face time with the patient.  Greater than 50% of this time was spent in counseling, explanation of diagnosis, planning of further management, and coordination of care.   Patrcia DollyKaren Aquino, M.D.   CC: Dr Dayton MartesAron

## 2014-10-18 ENCOUNTER — Encounter: Payer: Self-pay | Admitting: Neurology

## 2014-10-21 ENCOUNTER — Telehealth: Payer: Self-pay | Admitting: Family Medicine

## 2014-10-21 NOTE — Telephone Encounter (Signed)
Called patient's son/Torrie to speak with about referral to [facility] in [place] for him mom. I did explain that Dr. Karel JarvisAquino did write referral letter but patient doesn't qualify for inpatient care anymore as she is way past 30-45 day acute status. We are going to try to refer patient for outpatient therapy at their center. I did call & left a message on voicemail for the outpatient referral dept, outgoing msg states that someone would return my call within 24 hours. Will await return call to see what if anything is needed to be sent to them besides patient's referral letter.  [facility] [phone]

## 2014-11-02 NOTE — Telephone Encounter (Signed)
Tried calling patient's son/Torrie again after leaving him a vm last week about pts appt at [facility]. I did speak with him this morning to give him information about upcoming appt for patient.   After speaking with the outpatient scheduling dept @ [facility] last week they informed me since the patient had been in there facility as in inpatient within the past 3 years they didn't need any type of referral or letter to be sent to them. She would be able to go ahead and schedule patient's 1st outpatient appt with me. She made an appt for patient for Jan. 14th @ 2:30 with Carlyle BasquesSandra Williams in their multi specialty clinic.  I explained to Torrie that I went ahead and took the appt because that was the 1st available, but if that didn't work for them I gave him the number to call to reschedule the appt. He did state that he didn't think he could get patient there on the 14th. I gave him number to call [phone] and ask for the scheduling department.

## 2015-01-04 ENCOUNTER — Ambulatory Visit (INDEPENDENT_AMBULATORY_CARE_PROVIDER_SITE_OTHER): Payer: 59 | Admitting: Neurology

## 2015-01-04 ENCOUNTER — Encounter: Payer: Self-pay | Admitting: Neurology

## 2015-01-04 VITALS — BP 122/94 | HR 91 | Resp 18

## 2015-01-04 DIAGNOSIS — G40209 Localization-related (focal) (partial) symptomatic epilepsy and epileptic syndromes with complex partial seizures, not intractable, without status epilepticus: Secondary | ICD-10-CM

## 2015-01-04 DIAGNOSIS — Z8673 Personal history of transient ischemic attack (TIA), and cerebral infarction without residual deficits: Secondary | ICD-10-CM

## 2015-01-04 NOTE — Patient Instructions (Signed)
1. Continue current medications 2. Follow-up in 4-5 months

## 2015-01-08 ENCOUNTER — Encounter: Payer: Self-pay | Admitting: Neurology

## 2015-01-08 NOTE — Progress Notes (Signed)
NEUROLOGY FOLLOW UP OFFICE NOTE  [name] [id number]  HISTORY OF PRESENT ILLNESS: I had the pleasure of seeing [name] in follow-up in the neurology clinic on 01/04/2015.  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 She is again accompanied by her son who supplements the history today. Her family had been wishing for her to move back to [facility] for therapy, however found it difficult for her to do outpatient therapy. Since her last visit, her son denies any seizures. No change in her condition, she remains non-verbal, smiling when spoken to, does not follow commands, with contractures in both hands. She is sitting in a wheelchair today (usually comes in a stretcher). She continues on Vimpat  BID, Keppra  BID, and Dilantin  BID.  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 She underwent craniotomy for clip ligation in 10/2013, per records, the procedure was complicated by intraoperative seizures despite keppra load, ativan, and propofol so she was loaded with fosphenytoin. She also had intraoperative hemorrhage. Imaging post op showed infarct extension with bilateral ACA infarcts and left sided caudate infarct. When she was discharged to rehab, she was unable to move all her extremities with note of bilateral hand contractures, non-verbal, tracks. Her son reports that she was moved to a rehab center in [place], where she did quite well, she could grasp balls, state where she was born, where she was at, and could take sips of water. He reports that she had a seizure in February, April, and most recently in May 2015. The son only witnessed one of the seizures where she was biting down with head turn to the left. They deny any staring episodes, stating she is always interactive.After the last seizure while still in [place], he reports that  she had regressed. She smiles when spoken to, easily gets frustrated. She would "huff and puff" and become even more difficult to understand. She shakes her head yes and no.  Records and images were personally reviewed where available. I personally reviewed MRI brain without contrast which showed remote large medial frontal and parietal lobe infarcts with encephalomalacia and laminar necrosis, prior left frontal temporal craniotomy, encephalomalacia in the anterior left frontal lobe and left temporal lobe. Her EEG from [place] had shown periodic discharges from the left hemisphere. Repeat EEG done in our office was normal. She had episodes of hyperventilation and restlessness which did not show any EEG changes.   PAST MEDICAL HISTORY: Past Medical History  Diagnosis Date  . Hypertension   . SAH (subarachnoid hemorrhage)   . Seizure   . Stroke   . Tracheostomy status   . Tracheomalacia   . DVT (deep venous thrombosis)     MEDICATIONS: Current Outpatient Prescriptions on File Prior to Visit  Medication Sig Dispense Refill  . acetaminophen (TYLENOL) 325 MG tablet Give 650 mg by tube every 4 (four) hours as needed (elevated temperature).    Marland Kitchen albuterol (PROVENTIL) (2.5 MG/3ML) 0.083% nebulizer solution Take 3 mLs (2.5 mg total) by nebulization every 6 (six) hours. 75 mL 12  . antiseptic oral rinse (BIOTENE) LIQD 15 mLs by Mouth Rinse route 2 times daily at 12 noon and 4 pm.    . chlorhexidine (PERIDEX) 0.12 % solution 15 mLs by Mouth Rinse route 2 (two) times daily. 120 mL 0  . cholestyramine (QUESTRAN) 4 G packet  Place 4 g into feeding tube 3 (three) times daily.     . cloNIDine (CATAPRES) 0.2 MG tablet Take 1 tablet (0.2 mg total) by mouth 3 (three) times daily.    . famotidine (PEPCID) 40 MG/5ML suspension Place 2.5 mLs (20 mg total) into feeding tube every 12 (twelve) hours. 50 mL 0  . guaifenesin (ROBAFEN) 100 MG/5ML syrup Give 200 mg by tube every 6 (six) hours as needed for cough.      . lacosamide (VIMPAT) 50 MG TABS tablet Give 100 mg by tube 2 (two) times daily.     Marland Kitchen. levETIRAcetam (KEPPRA) 100 MG/ML solution Take 1,500 mg by mouth 2 (two) times daily.    . metoprolol (LOPRESSOR) 100 MG tablet Place 1 tablet (100 mg total) into feeding tube 2 (two) times daily.    . minoxidil (LONITEN) 10 MG tablet Place 1 tablet (10 mg total) into feeding tube 2 (two) times daily.    . Nutritional Supplements (FEEDING SUPPLEMENT, GLUCERNA 1.2 CAL,) LIQD Place 1,000 mLs into feeding tube continuous. Infuse at 50 ml /hr    . vitamin C (ASCORBIC ACID) 500 MG tablet 500 mg by PEG Tube route 2 (two) times daily.     . Water For Irrigation, Sterile (FREE WATER) SOLN Place 200 mLs into feeding tube every 6 (six) hours.     No current facility-administered medications on file prior to visit.    ALLERGIES: Allergies  Allergen Reactions  . Amlodipine Swelling  . Penicillins Swelling  . Latex Other (See Comments)    Unknown   . Other Other (See Comments)    Natural Rubber- Unknown     FAMILY HISTORY: Family History  Problem Relation Age of Onset  . Heart disease Mother 7045    MI    SOCIAL HISTORY: History   Social History  . Marital Status: Single    Spouse Name: N/A  . Number of Children: N/A  . Years of Education: N/A   Occupational History  . Not on file.   Social History Main Topics  . Smoking status: Former Smoker    Types: Cigarettes  . Smokeless tobacco: Never Used     Comment: trying to quit-2 cigarettes daily  . Alcohol Use: No     Comment: Very seldom  . Drug Use: No  . Sexual Activity: Not on file   Other Topics Concern  . Not on file   Social History Narrative    REVIEW OF SYSTEMS unable to obtain due to non-verbal  PHYSICAL EXAM: Filed Vitals:   01/04/15 1532  BP: 122/94  Pulse: 91  Resp: 18   General: No acute distress, sitting on wheelchair, smiles when name is called, does not follow commands.  Head: Normocephalic/atraumatic Eyes:  Fundoscopic exam shows bilateral sharp discs, no vessel changes, exudates, or hemorrhages Neck: supple, no paraspinal tenderness, full range of motion Back: No paraspinal tenderness Heart: regular rate and rhythm Lungs: Clear to auscultation bilaterally. Vascular: No carotid bruits. Skin/Extremities: No rash, no edema, both hands in splint with contractures  Neurological Exam: Mental status: alert, non-verbal, does not follow commands  Cranial nerves: CN I: not tested CN II: pupils equal, round and reactive to light, visual fields intact, fundi unremarkable. CN III, IV, VI: full range of motion, no nystagmus, no ptosis CN V: unable to test CN VII: upper and lower face symmetric CN VIII: unable to test CN IX, X: unable to test CN XI: sternocleidomastoid and trapezius muscles intact CN XII: would not open  mouth to command Bulk & Tone: increased tone on both UE, flaccid on both LE (unchanged) Motor: at least 3/5 on both UE, 0/5 both LE but withdrawal response to pain Sensation: does not withdraw to pain on both UE, withdrawal response to pain on both feet.  Deep Tendon Reflexes: +2 on both UE, absent reflexes on both LE, no ankle clonus Plantar responses: mute bilaterally Cerebellar: unable to test Gait: deferred Tremor: none  IMPRESSION: This is an unfortunate 49 yo woman with a 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 Her son reports that since her last seizure in May, she has regressed with communication and motor skills that she had slowly started to regain while in rehab in [place]. MRI brain did not show any acute changes, there was encephalomalacia in the bilateral ACA territories, and left frontal and temporal regions, which would account for the bilateral leg weakness and aphasia. Repeat EEG normal. Continue current AEDs for now, we may taper off Dilantin in the future. She will  follow-up in 4-5 months.  Thank you for allowing me to participate in her care.  Please do not hesitate to call for any questions or concerns.  The duration of this appointment visit was 15 minutes of face-to-face time with the patient.  Greater than 50% of this time was spent in counseling, explanation of diagnosis, planning of further management, and coordination of care.   Patrcia Dolly, M.D.   CC: Dr. Dayton Martes

## 2015-02-07 ENCOUNTER — Emergency Department (HOSPITAL_COMMUNITY): Payer: 59

## 2015-02-07 ENCOUNTER — Emergency Department (HOSPITAL_COMMUNITY)
Admission: EM | Admit: 2015-02-07 | Discharge: 2015-02-08 | Disposition: A | Discharge status: Home / Self Care | Payer: 59 | Attending: Emergency Medicine | Admitting: Emergency Medicine

## 2015-02-07 ENCOUNTER — Encounter (HOSPITAL_COMMUNITY): Payer: Self-pay | Admitting: Emergency Medicine

## 2015-02-07 DIAGNOSIS — Z90710 Acquired absence of both cervix and uterus: Secondary | ICD-10-CM | POA: Insufficient documentation

## 2015-02-07 DIAGNOSIS — Z79899 Other long term (current) drug therapy: Secondary | ICD-10-CM | POA: Insufficient documentation

## 2015-02-07 DIAGNOSIS — Z91040 Latex allergy status: Secondary | ICD-10-CM | POA: Insufficient documentation

## 2015-02-07 DIAGNOSIS — Z86718 Personal history of other venous thrombosis and embolism: Secondary | ICD-10-CM | POA: Diagnosis not present

## 2015-02-07 DIAGNOSIS — Z8673 Personal history of transient ischemic attack (TIA), and cerebral infarction without residual deficits: Secondary | ICD-10-CM | POA: Insufficient documentation

## 2015-02-07 DIAGNOSIS — G40909 Epilepsy, unspecified, not intractable, without status epilepticus: Secondary | ICD-10-CM | POA: Insufficient documentation

## 2015-02-07 DIAGNOSIS — Z9049 Acquired absence of other specified parts of digestive tract: Secondary | ICD-10-CM | POA: Diagnosis not present

## 2015-02-07 DIAGNOSIS — Z87891 Personal history of nicotine dependence: Secondary | ICD-10-CM | POA: Diagnosis not present

## 2015-02-07 DIAGNOSIS — Z880 Allergy status to penicillin: Secondary | ICD-10-CM | POA: Insufficient documentation

## 2015-02-07 DIAGNOSIS — Z791 Long term (current) use of non-steroidal anti-inflammatories (NSAID): Secondary | ICD-10-CM | POA: Diagnosis not present

## 2015-02-07 DIAGNOSIS — N390 Urinary tract infection, site not specified: Secondary | ICD-10-CM | POA: Insufficient documentation

## 2015-02-07 DIAGNOSIS — L03311 Cellulitis of abdominal wall: Secondary | ICD-10-CM | POA: Diagnosis not present

## 2015-02-07 DIAGNOSIS — I10 Essential (primary) hypertension: Secondary | ICD-10-CM | POA: Insufficient documentation

## 2015-02-07 DIAGNOSIS — R509 Fever, unspecified: Secondary | ICD-10-CM | POA: Diagnosis present

## 2015-02-07 LAB — BASIC METABOLIC PANEL
Anion gap: 10 (ref 5–15)
BUN: 9 mg/dL (ref 6–23)
CALCIUM: 8.8 mg/dL (ref 8.4–10.5)
CO2: 23 mmol/L (ref 19–32)
Chloride: 109 mmol/L (ref 96–112)
Creatinine, Ser: 0.43 mg/dL — ABNORMAL LOW (ref 0.50–1.10)
GFR calc Af Amer: >90 mL/min (ref >90)
GFR calc non Af Amer: >90 mL/min (ref >90)
GLUCOSE: 117 mg/dL — AB (ref 70–99)
Potassium: 3.5 mmol/L (ref 3.5–5.1)
Sodium: 142 mmol/L (ref 135–145)

## 2015-02-07 LAB — I-STAT CG4 LACTIC ACID, ED: LACTIC ACID, VENOUS: 1.66 mmol/L (ref 0.5–2.0)

## 2015-02-07 LAB — CBC WITH DIFFERENTIAL/PLATELET
BASOS ABS: 0 10*3/uL (ref 0.0–0.1)
Basophils Relative: 0 % (ref 0–1)
EOS PCT: 0 % (ref 0–5)
Eosinophils Absolute: 0 10*3/uL (ref 0.0–0.7)
HEMATOCRIT: 40.1 % (ref 36.0–46.0)
Hemoglobin: 13.2 g/dL (ref 12.0–15.0)
LYMPHS PCT: 18 % (ref 12–46)
Lymphs Abs: 2.9 10*3/uL (ref 0.7–4.0)
MCH: 30.1 pg (ref 26.0–34.0)
MCHC: 32.9 g/dL (ref 30.0–36.0)
MCV: 91.6 fL (ref 78.0–100.0)
MONO ABS: 1.7 10*3/uL — AB (ref 0.1–1.0)
MONOS PCT: 10 % (ref 3–12)
Neutro Abs: 11.9 10*3/uL — ABNORMAL HIGH (ref 1.7–7.7)
Neutrophils Relative %: 72 % (ref 43–77)
Platelets: 175 10*3/uL (ref 150–400)
RBC: 4.38 MIL/uL (ref 3.87–5.11)
RDW: 13.4 % (ref 11.5–15.5)
WBC: 16.5 10*3/uL — ABNORMAL HIGH (ref 4.0–10.5)

## 2015-02-07 LAB — URINE MICROSCOPIC-ADD ON

## 2015-02-07 LAB — URINALYSIS, ROUTINE W REFLEX MICROSCOPIC
BILIRUBIN URINE: NEGATIVE
GLUCOSE, UA: NEGATIVE mg/dL
Hgb urine dipstick: NEGATIVE
KETONES UR: NEGATIVE mg/dL
Nitrite: NEGATIVE
Protein, ur: 30 mg/dL — AB
Specific Gravity, Urine: 1.02 (ref 1.005–1.030)
Urobilinogen, UA: 0.2 mg/dL (ref 0.0–1.0)
pH: 5 (ref 5.0–8.0)

## 2015-02-07 MED ORDER — SODIUM CHLORIDE 0.9 % IV BOLUS (SEPSIS)
500.0000 mL | Freq: Once | INTRAVENOUS | Status: AC
  Administered 2015-02-07: 500 mL via INTRAVENOUS

## 2015-02-07 MED ORDER — SODIUM CHLORIDE 0.9 % IV SOLN
Freq: Once | INTRAVENOUS | Status: AC
  Administered 2015-02-07: 21:00:00 via INTRAVENOUS

## 2015-02-07 MED ORDER — ACETAMINOPHEN 160 MG/5ML PO SOLN
650.0000 mg | Freq: Once | ORAL | Status: AC
  Administered 2015-02-07: 650 mg
  Filled 2015-02-07: qty 20.3

## 2015-02-07 MED ORDER — METOPROLOL TARTRATE 1 MG/ML IV SOLN
5.0000 mg | INTRAVENOUS | Status: DC | PRN
  Administered 2015-02-07: 5 mg via INTRAVENOUS
  Filled 2015-02-07 (×2): qty 5

## 2015-02-07 MED ORDER — LEVOFLOXACIN IN D5W 750 MG/150ML IV SOLN
750.0000 mg | Freq: Once | INTRAVENOUS | Status: AC
  Administered 2015-02-07: 750 mg via INTRAVENOUS
  Filled 2015-02-07: qty 150

## 2015-02-07 MED ORDER — IBUPROFEN 100 MG/5ML PO SUSP
600.0000 mg | Freq: Once | ORAL | Status: AC
  Administered 2015-02-07: 600 mg
  Filled 2015-02-07: qty 30

## 2015-02-07 MED ORDER — SODIUM CHLORIDE 0.9 % IV BOLUS (SEPSIS): 1000.0000 mL | Freq: Once | INTRAVENOUS | Status: DC

## 2015-02-07 MED ORDER — LEVOFLOXACIN 500 MG PO TABS: 500.0000 mg | ORAL_TABLET | Freq: Every day | ORAL | Status: DC

## 2015-02-07 NOTE — ED Provider Notes (Signed)
CSN: [id number]     Arrival date & time 02/07/15  1700 History   First MD Initiated Contact with Patient 02/07/15 1743     Chief Complaint  Patient presents with  . Gtube infection       HPI  Impression presents for evaluation from [facility] health-care by EMS. Has a history of arachnoid hemorrhage. Has been requiring skilled nursing care facility since her original episode. Staff noted some erythema extending out from her G-tube site yesterday. Low-grade temp to 100 today she was transferred here. Patient is minimally verbal at baseline. However per family, EMS report, and notes that accompany her has not been hypoxemic. Does not have vomiting. Continues to have normal bowel movements. Not hypoxemic.  Past Medical History  Diagnosis Date  . Hypertension   . SAH (subarachnoid hemorrhage)   . Seizure   . Stroke   . Tracheostomy status   . Tracheomalacia   . DVT (deep venous thrombosis)    Past Surgical History  Procedure Laterality Date  . Abdominal hysterectomy    . Cholecystectomy     Family History  Problem Relation Age of Onset  . Heart disease Mother 29    MI   History  Substance Use Topics  . Smoking status: Former Smoker    Types: Cigarettes  . Smokeless tobacco: Never Used     Comment: trying to quit-2 cigarettes daily  . Alcohol Use: No     Comment: Very seldom   OB History    No data available     Review of Systems  Unable to perform ROS: Patient nonverbal      Allergies  Amlodipine; Penicillins; Latex; and Other  Home Medications   Prior to Admission medications   Medication Sig Start Date End Date Taking? Authorizing Provider  acetaminophen (TYLENOL) 325 MG tablet Give 650 mg by tube every 4 (four) hours as needed (elevated temperature).   Yes Historical Provider, MD  albuterol (PROVENTIL) (2.5 MG/3ML) 0.083% nebulizer solution Take 3 mLs (2.5 mg total) by nebulization every 6 (six) hours. 04/07/14  Yes Jeanella Craze, NP  antiseptic oral rinse  (CPC / CETYLPYRIDINIUM CHLORIDE 0.05%) 0.05 % LIQD solution 15 mLs by Mouth Rinse route 2 (two) times daily.   Yes Historical Provider, MD  cholestyramine Lanetta Inch) 4 G packet Place 4 g into feeding tube 3 (three) times daily.    Yes Historical Provider, MD  cloNIDine (CATAPRES) 0.2 MG tablet Take 1 tablet (0.2 mg total) by mouth 3 (three) times daily. 04/07/14  Yes Jeanella Craze, NP  famotidine (PEPCID) 40 MG/5ML suspension Place 2.5 mLs (20 mg total) into feeding tube every 12 (twelve) hours. 04/07/14  Yes Jeanella Craze, NP  guaifenesin (ROBAFEN) 100 MG/5ML syrup Give 200 mg by tube every 6 (six) hours.    Yes Historical Provider, MD  lacosamide (VIMPAT) 50 MG TABS tablet Give 100 mg by tube 2 (two) times daily.    Yes Historical Provider, MD  levETIRAcetam (KEPPRA) 100 MG/ML solution Take 1,500 mg by mouth 2 (two) times daily.   Yes Historical Provider, MD  meloxicam (MOBIC) 7.5 MG tablet Take 7.5 mg by mouth daily.   Yes Historical Provider, MD  metoprolol (LOPRESSOR) 100 MG tablet Place 1 tablet (100 mg total) into feeding tube 2 (two) times daily. 04/07/14  Yes Jeanella Craze, NP  minoxidil (LONITEN) 10 MG tablet Place 1 tablet (10 mg total) into feeding tube 2 (two) times daily. 04/07/14  Yes Jeanella Craze, NP  Multiple Vitamins-Minerals (CEROVITE ADVANCED FORMULA) LIQD Take 15 mLs by mouth daily.   Yes Historical Provider, MD  traZODone (DESYREL) 50 MG tablet Take 25 mg by mouth at bedtime.   Yes Historical Provider, MD  UNABLE TO FIND Phenytoin Suspension 100 mg/64ml Give 200 mg via PEG-Tube every 12 hours   Yes Historical Provider, MD  vitamin C (ASCORBIC ACID) 500 MG tablet 500 mg by PEG Tube route 2 (two) times daily.    Yes Historical Provider, MD  Water For Irrigation, Sterile (FREE WATER) SOLN Place 200 mLs into feeding tube every 6 (six) hours. 04/07/14  Yes Jeanella Craze, NP  antiseptic oral rinse (BIOTENE) LIQD 15 mLs by Mouth Rinse route 2 times daily at 12 noon and 4 pm. Patient not  taking: Reported on 02/07/2015 04/07/14   Jeanella Craze, NP  chlorhexidine (PERIDEX) 0.12 % solution 15 mLs by Mouth Rinse route 2 (two) times daily. Patient not taking: Reported on 02/07/2015 04/07/14   Jeanella Craze, NP  levofloxacin (LEVAQUIN) 500 MG tablet Take 1 tablet (500 mg total) by mouth daily. 02/07/15   Rolland Porter, MD  Nutritional Supplements (FEEDING SUPPLEMENT, GLUCERNA 1.2 CAL,) LIQD Place 1,000 mLs into feeding tube continuous. Infuse at 50 ml /hr Patient not taking: Reported on 02/07/2015 04/07/14   Jeanella Craze, NP   BP 122/97 mmHg  Pulse 109  Temp(Src) 100.1 F (37.8 C) (Rectal)  Resp 13  SpO2 98% Physical Exam  Constitutional: She appears well-developed and well-nourished. No distress.  HENT:  Head: Normocephalic.  Eyes: Conjunctivae are normal. Pupils are equal, round, and reactive to light. No scleral icterus.  Neck: Normal range of motion. Neck supple. No thyromegaly present.  Cardiovascular: Normal rate and regular rhythm.  Exam reveals no gallop and no friction rub.   No murmur heard. Pulmonary/Chest: Effort normal and breath sounds normal. No respiratory distress. She has no wheezes. She has no rales.  Abdominal: Soft. Bowel sounds are normal. She exhibits no distension. There is no tenderness. There is no rebound.    Musculoskeletal: Normal range of motion.  Neurological: She is alert.  Skin: Skin is warm and dry. No rash noted.  Psychiatric: She has a normal mood and affect. Her behavior is normal.    ED Course  Procedures (including critical care time) Labs Review Labs Reviewed  CBC WITH DIFFERENTIAL/PLATELET - Abnormal; Notable for the following:    WBC 16.5 (*)    Neutro Abs 11.9 (*)    Monocytes Absolute 1.7 (*)    All other components within normal limits  BASIC METABOLIC PANEL - Abnormal; Notable for the following:    Glucose, Bld 117 (*)    Creatinine, Ser 0.43 (*)    All other components within normal limits  URINALYSIS, ROUTINE W REFLEX  MICROSCOPIC - Abnormal; Notable for the following:    APPearance CLOUDY (*)    Protein, ur 30 (*)    Leukocytes, UA SMALL (*)    All other components within normal limits  URINE MICROSCOPIC-ADD ON - Abnormal; Notable for the following:    Squamous Epithelial / LPF FEW (*)    Bacteria, UA MANY (*)    All other components within normal limits  URINE CULTURE  I-STAT CG4 LACTIC ACID, ED    Imaging Review Dg Chest Port 1 View  02/07/2015   CLINICAL DATA:  Fever and bleeding around the G-tube.  EXAM: PORTABLE CHEST - 1 VIEW  COMPARISON:  04/06/2014  FINDINGS: The cardiac silhouette, mediastinal and  hilar contours are within normal limits. There are chronic bronchitic type interstitial lung changes but no infiltrates, edema or effusions. The bony thorax is intact.  IMPRESSION: No acute cardiopulmonary findings.  Chronic lung changes.   Electronically Signed   By: Rudie MeyerP.  Gallerani M.D.   On: 02/07/2015 18:50     EKG Interpretation None      MDM   Final diagnoses:  Fever  Cellulitis of abdominal wall  UTI (lower urinary tract infection)   Temperature 101.3 oral at bedside.  Clinically has a very mild limited cellulitis adjacent her G-tube site. G-tube is freely mobile within the stoma. Is not painful to her. Aspiration flushes without difficulty. Her abdomen is otherwise benign. Clinically, and radiographically, does not have pneumonia. Urine shows signs of infection however nitrate negative.  Given 750 mg IV Levaquin. This makes little coverage for urinary tract as well as her cellulitis. Does not had her antihypertensives today. Was given one dose IV metoprolol. Heart rate and blood pressure improved. Discharge heart rate 89. BP 122/97. Temperature improved from a MAXIMUM TEMPERATURE of 101.3, 299. Family felt comfortable with patient being discharged back to her care facility I think this is appropriate. Given appropriate discharge instructions. Continue anti-hypertensives. Ten-day course  Levaquin.      Rolland PorterMark Myrtle Barnhard, MD 02/08/15 [phone]

## 2015-02-07 NOTE — ED Notes (Signed)
Report given to facility. Ptar called for pt transport.

## 2015-02-07 NOTE — ED Notes (Signed)
Per EMS: pt from [facility].  Pt has had fever and redness around gtube site since yesterday.  Temp of 100 per staff.

## 2015-02-07 NOTE — Discharge Instructions (Signed)
Prescription for Levaquin, an antibiotic. Levaquin is proper treatment for urinary tract infection, as well as skin infections(cellulitis). Please discuss with her primary care physician and treatment team the possibility of removal of PEG tube. Take tube does NOT after be taken out emergently due to the slight infection of the surrounding skin. Tylenol as needed for fever.   Urinary Tract Infection A urinary tract infection (UTI) can occur any place along the urinary tract. The tract includes the kidneys, ureters, bladder, and urethra. A type of germ called bacteria often causes a UTI. UTIs are often helped with antibiotic medicine.  HOME CARE   If given, take antibiotics as told by your doctor. Finish them even if you start to feel better.  Drink enough fluids to keep your pee (urine) clear or pale yellow.  Avoid tea, drinks with caffeine, and bubbly (carbonated) drinks.  Pee often. Avoid holding your pee in for a long time.  Pee before and after having sex (intercourse).  Wipe from front to back after you poop (bowel movement) if you are a woman. Use each tissue only once. GET HELP RIGHT AWAY IF:   You have back pain.  You have lower belly (abdominal) pain.  You have chills.  You feel sick to your stomach (nauseous).  You throw up (vomit).  Your burning or discomfort with peeing does not go away.  You have a fever.  Your symptoms are not better in 3 days. MAKE SURE YOU:   Understand these instructions.  Will watch your condition.  Will get help right away if you are not doing well or get worse. Document Released: 04/09/2008 Document Revised: 07/16/2012 Document Reviewed: 05/22/2012 [facility] Patient Information 2015 [place], [place]. This information is not intended to replace advice given to you by your health care provider. Make sure you discuss any questions you have with your health care provider.  Cellulitis Cellulitis is an infection of the skin and the tissue  under the skin. The infected area is usually red and tender. This happens most often in the arms and lower legs. HOME CARE   Take your antibiotic medicine as told. Finish the medicine even if you start to feel better.  Keep the infected arm or leg raised (elevated).  Put a warm cloth on the area up to 4 times per day.  Only take medicines as told by your doctor.  Keep all doctor visits as told. GET HELP IF:  You see red streaks on the skin coming from the infected area.  Your red area gets bigger or turns a dark color.  Your bone or joint under the infected area is painful after the skin heals.  Your infection comes back in the same area or different area.  You have a puffy (swollen) bump in the infected area.  You have new symptoms.  You have a fever. GET HELP RIGHT AWAY IF:   You feel very sleepy.  You throw up (vomit) or have watery poop (diarrhea).  You feel sick and have muscle aches and pains. MAKE SURE YOU:   Understand these instructions.  Will watch your condition.  Will get help right away if you are not doing well or get worse. Document Released: 04/09/2008 Document Revised: 03/08/2014 Document Reviewed: 01/07/2012 [facility]ExitCare Patient Information 2015 [place], [place]. This information is not intended to replace advice given to you by your health care provider. Make sure you discuss any questions you have with your health care provider.

## 2015-02-07 NOTE — ED Notes (Signed)
[facility] EMS states infection around stomach tube

## 2015-02-08 DIAGNOSIS — L03311 Cellulitis of abdominal wall: Secondary | ICD-10-CM | POA: Diagnosis not present

## 2015-02-08 LAB — URINE CULTURE
Colony Count: NO GROWTH
Culture: NO GROWTH

## 2015-02-08 MED ORDER — METOPROLOL TARTRATE 25 MG PO TABS
50.0000 mg | ORAL_TABLET | Freq: Once | ORAL | Status: AC
  Administered 2015-02-08: 50 mg via ORAL
  Filled 2015-02-08: qty 2

## 2015-02-25 NOTE — H&P (Signed)
PATIENT NAME:  [name], [name] MR#:  [id number] DATE OF BIRTH:  May 11, 1966  DATE OF ADMISSION:  08/11/2013  PRIMARY CARE PHYSICIAN:  Dr. Irven Easterly.   REFERRING PHYSICIAN: Dr. Sharman Cheek.   CHIEF COMPLAINT: Headache.   HISTORY OF PRESENT ILLNESS: [name] is 49 year old, obese African American female with a past medical history of hypertension, gastroesophageal reflux disease, who was at  started to experience headache on the right side of the  head, 10/10 in intensity, then checked her blood pressure and found to have systolic blood pressure in 180s. The patient came to the Emergency Department. The patient's initial blood pressure was over 200. Was given labetalol, multiple doses of morphine without much improvement. There was concern about subarachnoid hemorrhage.  Attempted LP, which was unsuccessful. CT head without contrast was unremarkable. Considering this, the patient underwent a CT of the head, which did not relieve any aneurysmal dilation or signs of any subarachnoid hemorrhage. The patient denies any change in vision. No nausea or vomiting. The patient states compliant with her medications.   PAST MEDICAL HISTORY:  1.  Hypertension.  2.  Hyperlipidemia.  3.  Obesity. 4.  Reactive airway disease and history of chronic sinus problems.   ALLERGIES: PENICILLIN CAUSES SWELLING.  HOME MEDICATIONS:  1.  Benicar/hydrochlorothiazide 40/25 mg 1 tablet once a day.  2.  Aspirin 81 mg daily.   PAST SURGICAL HISTORY:  1.  Tubal ligation.  2.  Cholecystectomy.  3.  Hysterectomy.   PERSONAL HISTORY:  Smokes about 3 to 4 cigarettes a day. Drinks alcohol occasionally. Denies using any illicit drugs. Lives with her two sons.   FAMILY HISTORY: Strong family history of coronary artery disease.   REVIEW OF SYSTEMS.  CONSTITUTIONAL: Denies any generalized weakness.  EYES: No change in vision.  EARS, NOSE, THROAT: No change in hearing. RESPIRATORY:  No cough or shortness of breath.   CARDIOVASCULAR:  No chest pain, palpitations.  GASTROINTESTINAL: No nausea, vomiting, abdominal pain.  GENITOURINARY: No dysuria or hematuria.  SKIN: No rash or lesions.  ENDOCRINE: No polyuria or polydipsia.  HEMATOLOGIC:  No easy bruising or bleeding.  MUSCULOSKELETAL: No joint pains. NEUROLOGIC:  No weakness or numbness in any part of the body.   PHYSICAL EXAMINATION:  GENERAL: This is a well-built, well-nourished, obese female, lying down in the bed, not in distress.  VITAL SIGNS: Temperature 98.7, pulse 80, blood pressure 193/91, respiratory rate of 18, oxygen saturations 93% on room air.  HEENT: Head normocephalic, atraumatic. No scleral icterus. Conjunctivae normal. Pupils equal and react to light. Mucous membranes moist. No pharyngeal erythema.  NECK: Supple. No lymphadenopathy. No JVD. No carotid bruit. No thyromegaly. CHEST:  No focal tenderness. LUNGS:  Bilaterally clear to auscultation. HEART: S1 and S2.  Regular.  No murmurs are heard. ABDOMEN:  Bowel sounds present.  Nontender, nondistended, no hepatosplenomegaly.  EXTREMITIES: No pedal edema. Pulses 2+.   NEUROLOGIC:  The patient is alert, oriented to place, person and time. Cranial nerves II through XII intact. Motor 5/5 in upper and lower extremities. No meningeal signs. No nuchal rigidity.   LABORATORY DATA: CMP is completely within normal limits except for a potassium of 3.4. PT 14.8, INR of 1.1. CBC:  Hemoglobin , hemoglobin ,platelet count of 189. Troponin 0.08. CT head without contrast: No acute intracranial abnormality.  Urinalysis negative for nitrites and leukocyte esterase.   ASSESSMENT AND PLAN: [name] is a 49 year old female who comes to the Emergency Department with accelerated hypertension.   1.  Accelerated hypertension.  Will add amlodipine to the regimen. Continue with labetalol as needed. Very highly concerning about patient might be having obstructive sleep apnea, which might be contributing to this  blood pressure. Discussed with the patient that the patient might benefit from getting a sleep study done.  2.  Morbid obesity. Counseled with the patient.  The patient has BMI of 40.8. Expressed understanding.  3.  Tobacco use. Counseled with the patient regarding quitting smoking. The patient expressed understanding. 4.  Keep the patient on deep vein thrombosis prophylaxis with Lovenox.  5.  Headache. The patient does not seem to be in any distress, comfortable. Does not have any nuchal rigidity.  This could be secondary to hypertension.  Will continue to follow.   ____________________________ Susa GriffinsPadmaja Starlena Beil, MD pv:cg D: 08/12/2013 01:26:00 ET T: 08/12/2013 01:47:22 ET JOB#: [id number]  cc: Susa GriffinsPadmaja Arrion Burruel, MD, <Dictator> Susa GriffinsPADMAJA Lillian Ballester MD ELECTRONICALLY SIGNED 08/23/2013 21:12

## 2015-02-25 NOTE — Consult Note (Signed)
Brief Consult Note: Diagnosis: unstable angina.   Patient was seen by consultant.   Consult note dictated.   Comments: recommend and right and left cardiac cath today.  Electronic Signatures: Lorine BearsArida, Lillyanna Glandon (MD)  (Signed 11-Apr-14 08:29)  Authored: Brief Consult Note   Last Updated: 11-Apr-14 08:29 by Lorine BearsArida, Kallan Merrick (MD)

## 2015-02-25 NOTE — Discharge Summary (Signed)
PATIENT NAME:  [name], [name] MR#:  [id number] DATE OF BIRTH:  06-Jun-1966  DATE OF ADMISSION:  02/12/2013 DATE OF DISCHARGE:  02/13/2013  PRIMARY CARE PHYSICIAN: Dr. Ruthe Mannanalia Lawson.  FINAL DIAGNOSES: 1.  Chest pain and elevated cardiac enzymes.  2.  Malignant hypertension.  3.  Lower extremity edema.  4.  Obesity.   MEDICATIONS ON DISCHARGE: Include: Lovastatin 50 mg daily, aspirin 81 mg daily, carvedilol 12.5 mg twice a day.   DIET: Low-sodium diet, regular consistency.   ACTIVITY: As tolerated.   FOLLOWUP: With Dr. Ruthe Mannanalia Lawson in 1 to 2 weeks.   HOSPITAL COURSE: The patient was admitted 02/12/2013. The patient was discharged 02/13/2013. Patient came in with chest pain and was found to have elevated blood pressure,  lower extremity edema and a borderline troponin. She was admitted to the hospital. Cardiology consultation was done by Dr. Kirke CorinArida. The patient was started on aspirin and Coreg for the chest pain for accelerated hypertension, The Azor was stopped because the Norvasc can cause lower extremity edema, and she was started on Coreg and losartan for lower extremity edema. Norvasc was stopped. The patient was also counseled on smoking cessation.   LABORATORY RADIOLOGICAL DATA DURING THE HOSPITAL COURSE: Included an EKG, showed normal sinus rhythm, left ventricular hypertrophy.   Chest x-ray showed no acute disease in the chest.   D-dimer negative. White blood cell count 8.8, H and H 13.3 and 39.5, platelet count of 220.    Glucose 94, BUN 14, creatinine 0.82, sodium 138, potassium 3.6, chloride 106, CO2 27.   Liver function tests: Normal range.   Troponin borderline at 0.09. Lower extremity Doppler of the left leg: No evidence of DVT.  Next troponin borderline at 0.07. Next troponin borderline at 0.07. LDL 62, HDL 39, triglycerides 88. TSH 0.88. Urinalysis negative.   Echocardiogram: EF 55% to 60%, left ventricular hypertrophy. Mildly dilated left atrium, mild mitral valve  regurgitation.   Cardiac catheterization by Dr. Kirke CorinArida shows normal coronaries. EF was not assessed on the cardiac catheter. Last troponin was 0.08.   HOSPITAL COURSE PER PROBLEM LIST:  1.  For her chest pain and elevated cardiac enzymes the patient was seen in consultation by  Dr. Kirke CorinArida of cardiology who did a cardiac catheterization. Cardiac cath was normal. Normal coronary arteries. Elevated cardiac enzymes, likely secondary to malignant hypertension. Chest pain could also be secondary to malignant hypertension or a different cause.  2.  For the patient's malignant hypertension the patient's Azor was stopped because the Norvasc can cause lower extremity edema. The patient was started on Coreg for cardiac protection, and losartan. I do recommend followup blood pressure reading in 1 week. Blood pressure upon discharge, 132/81. For her lower extremity edema the Norvasc was stopped. Hopefully her edema will improve. Can consider Lasix as outpatient.  3.  For her obesity, her TSH was in the normal range. I think the Norvasc could cause the weight gain.  4. Tobacco abuse: The patient was counseled on smoking cessation on admission.   Time spent on discharge: Thirty-five minutes.   ____________________________ Herschell Dimesichard J. Renae GlossWieting, MD rjw:dm D: 02/14/2013 18:59:30 ET T: 02/15/2013 08:45:50 ET JOB#: [id number]  cc: Herschell Dimesichard J. Renae GlossWieting, MD, <Dictator> [name]. Meghan MartesAron, MD Meghan ScarletICHARD J Neeti Knudtson MD ELECTRONICALLY SIGNED 02/25/2013 13:29

## 2015-02-25 NOTE — H&P (Signed)
PATIENT NAME:  [name], [name] MR#:  [id number] DATE OF BIRTH:  [birth year]  DATE OF ADMISSION:  02/12/2013  PRIMARY CARE PHYSICIAN:  Dr. Dayton Martes at [facility].   CHIEF COMPLAINT:  Chest pain and elevated blood pressure and lower extremity edema.   HISTORY OF PRESENT ILLNESS:  The patient is a 49 year old female with a history of hypertension who presents with chest pain and increasing lower extremity edema with elevated blood pressure.  The patient says that she started developing substernal chest pain about 9:00 this morning while she was standing at work.  This lasted about 1-1/2 minutes to 2 minutes.  It goes on and off, anywhere between a 5 to 8 out of 10, subsides on its own.  No exacerbating or relieving factors.  No nausea, vomiting.  She does have shortness of breath associated with it.  For the past couple days she has also had shortness of breath and lower extremity edema as well as in her hands.  She is currently chest pain-free.  In the ER Doppler's were done which were negative for DVT.  She had a chest x-ray, did not show any acute infiltrate.  Her troponin was initially elevated at 0.09 and then 0.07.  Dr. Mariah Milling was called initially in the ER for a troponin 0.09.   REVIEW OF SYSTEMS:  CONSTITUTIONAL:  No fever.  Positive fatigue, weakness.  EYES:  No blurred or double vision, glaucoma, cataracts.  EARS, NOSE, THROAT:  No ear pain, hearing loss, seasonal allergies.  RESPIRATORY:  No cough, wheezing, hemoptysis.  Positive shortness of breath.  CARDIOVASCULAR:  Positive for chest pain.  No orthopnea, edema, arrhythmia.  Positive dyspnea on exertion.  No palpitations, syncope.  GASTROINTESTINAL:  No nausea, vomiting, diarrhea, abdominal pain, melena, or ulcers.  GENITOURINARY:  No dysuria, hematuria. ENDOCRINE:  No polyuria or polydipsia. HEMATOLOGY AND LYMPHATIC:  No anemia or easy bruising.  SKIN:  No rash or lesions.   MUSCULOSKELETAL:  No pain in the shoulders or knees.  She has lower  extremity edema.   NEUROLOGIC:  No history of CVA, TIA or seizures.  PSYCHIATRIC:  No history of anxiety or depression.   PAST MEDICAL HISTORY:   1.  Obesity. 2.  Hypertension.  3.  GERD.  4.  Reactive airway disease.  5.  Chronic sinus problem.   MEDICATIONS:  Azor 10/40 mg 1 tablet daily.   ALLERGIES:  PENICILLIN CAUSES SWELLING.   PAST SURGICAL HISTORY: 1.  Tubal ligation.  2.  Cholecystectomy.   SOCIAL HISTORY:  She smokes 1 pack every three days and not interested in quitting at this time.  Occasional alcohol use.   FAMILY HISTORY:  Positive for hypertension and heart disease.   PHYSICAL EXAMINATION: VITAL SIGNS:  Temperature is 98.1, pulse is 84, respirations 20, blood pressure 180/96.  Initially her blood pressure systolic was 194 on admission to ER, 99% on room air. GENERAL:  The patient is alert, oriented, not in acute distress.  HEENT:  Head is atraumatic.  Pupils are round and reactive.  Sclerae anicteric.  Mucous membranes are moist.  Oropharynx is clear. NECK:  Supple without JVD, carotid bruit, enlarged thyroid CARDIOVASCULAR:  Regular rate and rhythm.  No murmurs, gallops, or rubs. PMI is not displaced.  LUNGS:  Clear to auscultation without crackles, rales, rhonchi, or wheezing.  Normal percussion.  BACK:  No CVA or vertebral tenderness.  ABDOMEN:  Bowel sounds positive, nontender, nondistended.  No hepatosplenomegaly.  EXTREMITIES:  Very minimal edema bilaterally and symmetrically  about 1+.  NEUROLOGIC:  Cranial nerves II through XII intact.  No focal deficits.   SKIN:  Without rashes or lesions.     LABORATORY DATA:  LFTs are all normal.  Troponin first set is 0.09, second is 0.07.  White blood cells 8.8, hemoglobin 13.3, hematocrit 39.4, platelets are 220.  Sodium 138, potassium 3.6, chloride of 106, bicarbonate 27, BUN 14, creatinine 0.82, glucose is 94.  Calcium is 8.6.  D-dimer 0.27.  EKG, normal sinus rhythm.  There is no ST elevations or depression.    ASSESSMENT AND PLAN:  This is a 49 year old female presents with chest pain with elevated troponin that is actually trending downward as well as accelerated hypertension and lower extremity edema.  1.  Chest pain with elevated troponin.  The patient will be admitted with telemetry.  We will continue to follow the cardiac enzymes and have Dr. Mariah MillingGollan see the patient in consultation.  I have started an aspirin and Coreg.   2.  Accelerated hypertension which could be a cause of her shortness of breath.  Norvasc component of Azor can cause lower extremity edema so I have held the [employer].  I have started losartan and Coreg with hydralazine as needed.  Monitor her blood pressures carefully.  3.  Lower extremity edema.  Could be from cardiac issues, thyroid problems, proteinuria or possibly from her Norvasc.  I will hold Norvasc, order a TSH, check a urinalysis and check an echocardiogram.  She does say that she snores so if she has some right heart strain may consider an outpatient sleep study evaluation.  4.  Smoking dependence.  I encouraged the patient to stop smoking.  At this time she says she does not want to.  She does not want a patch either.  Time spent approximately 3 minutes counseling the patient.   CODE STATUS:  THE PATIENT IS FULL CODE STATUS.   TIME SPENT:  Approximately 45 minutes.    ____________________________ Janyth ContesSital P. Juliene PinaMody, MD spm:ea D: 02/12/2013 22:12:15 ET T: 02/12/2013 23:03:13 ET JOB#: [id number]  cc: Maksymilian Mabey P. Juliene PinaMody, MD, <Dictator> [facility]  Hugh Kamara P Harutyun Monteverde MD ELECTRONICALLY SIGNED 02/13/2013 13:35

## 2015-02-25 NOTE — Consult Note (Signed)
PATIENT NAME:  [name], [name] DATE OF BIRTH:  September 24, 1966  DATE OF CONSULTATION:  02/13/2013  REFERRING PHYSICIAN: Sital P. Juliene Pina, MD  CONSULTING PHYSICIAN:  Shantanu Strauch A. Kirke Corin, MD  PRIMARY CARE PHYSICIAN: Bryn Gulling. Dayton Martes, MD  REASON FOR CONSULTATION: Chest pain and elevated cardiac enzymes.   HISTORY OF PRESENT ILLNESS: This is a 49 year old African-American female with no previous cardiac history. She has known history of hypertension, tobacco use and family history of premature coronary artery disease. She presented to the Emergency Room with substernal chest pain which happened yesterday while she was at work. It was intermittent, and it would last for about 5 minutes at a time. It happened both at rest and with physical activity. It was associated with significant shortness of breath. Her blood pressure has been difficult to control recently. She was switched to Azor, but developed lower extremity edema. She is not aware of history of sleep apnea. She has noticed dyspnea with minimal activities recently of unclear etiology. In the Emergency Room, she was noted to have slightly elevated troponin at 0.07, which subsequently went up to 0.09. Lower extremity Doppler was negative for DVT. D-dimer was normal. She had more chest pain overnight, but currently is chest pain free.   PAST MEDICAL HISTORY:  1. Hypertension.  2. Obesity.  3. Gastroesophageal reflux disease.  4. Tobacco use.   HOME MEDICATIONS: Include Azor 10/40 mg once daily.   ALLERGIES: INCLUDE PENICILLIN.   PAST SURGICAL HISTORY: Includes tubal ligation and cholecystectomy.   SOCIAL HISTORY: She smokes at least 1/2-pack per day for many years. She drinks alcohol occasionally and denies any recreational drug use.   FAMILY HISTORY: Her mother had myocardial infarction in her early 29s.   REVIEW OF SYSTEMS: A 10-point review of systems was performed. It is negative other than what is mentioned in the HPI.   PHYSICAL  EXAMINATION:  GENERAL: The patient appears to be at her stated age, in no acute distress.  VITAL SIGNS: Temperature is 98.1, pulse is 67, respiratory rate is 19, blood pressure is 156/84 and oxygen saturation is 95% on room air.  HEENT: Normocephalic, atraumatic.  NECK: No JVD or carotid bruits.  RESPIRATORY: Normal respiratory effort with no use of accessory muscles. Auscultation reveals normal breath sounds.  CARDIOVASCULAR: Normal PMI. Normal S1 and S2 with no gallops or murmurs.  ABDOMEN: Benign, nontender and nondistended.  EXTREMITIES: +1 edema bilaterally.  SKIN: Warm and dry with no rash.  PSYCHIATRIC: She is alert and oriented x3 with normal mood and affect.   LABORATORY AND DIAGNOSTIC DATA: Her renal function was normal. Troponin was elevated at 0.09. CPK was normal. Thyroid function was normal. CBC is within normal limits. D-dimer was 0.27. ECG showed normal sinus rhythm with no significant ST or T wave changes.   IMPRESSION:  1. Chest pain, likely unstable angina, with slightly elevated cardiac enzymes.  2. Uncontrolled hypertension with lower extremity edema.  3. Significant dyspnea.   RECOMMENDATIONS: The patient's history is worrisome for unstable angina with slightly elevated troponin level. She had more chest pain overnight. Due to all of that, I recommend proceeding with cardiac catheterization and possible coronary intervention. Risks, benefits and alternatives were discussed with the patient. Due to her significant dyspnea and lower extremity edema, I will also perform right heart catheterization to make sure she has no pulmonary hypertension. She denies symptoms of sleep apnea. Regarding her hypertension, consider adding a thiazide diuretic.   ____________________________ Chelsea Aus. Kirke Corin, MD  maa:OSi D: 02/13/2013 08:35:33 ET T: 02/13/2013 08:47:59 ET JOB#: [id number]  cc: Don Giarrusso A. Kirke CorinArida, MD, <Dictator> Sital P. Juliene PinaMody, MD Bryn Gullingalia M. Dayton MartesAron, MD [facility] Argentina DonovanA Laneah Luft  MD ELECTRONICALLY SIGNED 02/16/2013 11:12

## 2015-03-16 ENCOUNTER — Other Ambulatory Visit: Payer: Self-pay

## 2015-03-16 ENCOUNTER — Inpatient Hospital Stay (HOSPITAL_COMMUNITY)
Admission: EM | Admit: 2015-03-16 | Discharge: 2015-03-22 | DRG: 640 | Disposition: A | Discharge status: Skilled Nursing Facility | Payer: 59 | Attending: Internal Medicine | Admitting: Internal Medicine

## 2015-03-16 ENCOUNTER — Emergency Department (HOSPITAL_COMMUNITY): Payer: 59

## 2015-03-16 ENCOUNTER — Encounter (HOSPITAL_COMMUNITY): Payer: Self-pay

## 2015-03-16 ENCOUNTER — Ambulatory Visit (HOSPITAL_COMMUNITY): Payer: 59

## 2015-03-16 DIAGNOSIS — Z431 Encounter for attention to gastrostomy: Secondary | ICD-10-CM

## 2015-03-16 DIAGNOSIS — E876 Hypokalemia: Secondary | ICD-10-CM | POA: Diagnosis not present

## 2015-03-16 DIAGNOSIS — Z87891 Personal history of nicotine dependence: Secondary | ICD-10-CM

## 2015-03-16 DIAGNOSIS — Z90710 Acquired absence of both cervix and uterus: Secondary | ICD-10-CM

## 2015-03-16 DIAGNOSIS — Z6830 Body mass index (BMI) 30.0-30.9, adult: Secondary | ICD-10-CM | POA: Diagnosis not present

## 2015-03-16 DIAGNOSIS — Z888 Allergy status to other drugs, medicaments and biological substances status: Secondary | ICD-10-CM

## 2015-03-16 DIAGNOSIS — Z91040 Latex allergy status: Secondary | ICD-10-CM

## 2015-03-16 DIAGNOSIS — R569 Unspecified convulsions: Secondary | ICD-10-CM

## 2015-03-16 DIAGNOSIS — J398 Other specified diseases of upper respiratory tract: Secondary | ICD-10-CM | POA: Diagnosis present

## 2015-03-16 DIAGNOSIS — E860 Dehydration: Secondary | ICD-10-CM

## 2015-03-16 DIAGNOSIS — E669 Obesity, unspecified: Secondary | ICD-10-CM | POA: Diagnosis present

## 2015-03-16 DIAGNOSIS — L89152 Pressure ulcer of sacral region, stage 2: Secondary | ICD-10-CM | POA: Diagnosis present

## 2015-03-16 DIAGNOSIS — G9389 Other specified disorders of brain: Secondary | ICD-10-CM | POA: Diagnosis present

## 2015-03-16 DIAGNOSIS — Z930 Tracheostomy status: Secondary | ICD-10-CM | POA: Diagnosis not present

## 2015-03-16 DIAGNOSIS — Z86718 Personal history of other venous thrombosis and embolism: Secondary | ICD-10-CM | POA: Diagnosis not present

## 2015-03-16 DIAGNOSIS — I671 Cerebral aneurysm, nonruptured: Secondary | ICD-10-CM

## 2015-03-16 DIAGNOSIS — A047 Enterocolitis due to Clostridium difficile: Secondary | ICD-10-CM | POA: Diagnosis present

## 2015-03-16 DIAGNOSIS — Z8673 Personal history of transient ischemic attack (TIA), and cerebral infarction without residual deficits: Secondary | ICD-10-CM | POA: Diagnosis not present

## 2015-03-16 DIAGNOSIS — E870 Hyperosmolality and hypernatremia: Principal | ICD-10-CM | POA: Diagnosis present

## 2015-03-16 DIAGNOSIS — R1310 Dysphagia, unspecified: Secondary | ICD-10-CM | POA: Diagnosis present

## 2015-03-16 DIAGNOSIS — I10 Essential (primary) hypertension: Secondary | ICD-10-CM | POA: Diagnosis present

## 2015-03-16 DIAGNOSIS — G8250 Quadriplegia, unspecified: Secondary | ICD-10-CM | POA: Diagnosis present

## 2015-03-16 DIAGNOSIS — Z9049 Acquired absence of other specified parts of digestive tract: Secondary | ICD-10-CM | POA: Diagnosis present

## 2015-03-16 DIAGNOSIS — Z452 Encounter for adjustment and management of vascular access device: Secondary | ICD-10-CM

## 2015-03-16 DIAGNOSIS — E878 Other disorders of electrolyte and fluid balance, not elsewhere classified: Secondary | ICD-10-CM

## 2015-03-16 DIAGNOSIS — I639 Cerebral infarction, unspecified: Secondary | ICD-10-CM

## 2015-03-16 DIAGNOSIS — Z880 Allergy status to penicillin: Secondary | ICD-10-CM

## 2015-03-16 DIAGNOSIS — A419 Sepsis, unspecified organism: Secondary | ICD-10-CM

## 2015-03-16 DIAGNOSIS — R509 Fever, unspecified: Secondary | ICD-10-CM

## 2015-03-16 DIAGNOSIS — Z87898 Personal history of other specified conditions: Secondary | ICD-10-CM | POA: Diagnosis not present

## 2015-03-16 LAB — COMPREHENSIVE METABOLIC PANEL
ALT: 34 U/L (ref 14–54)
AST: 31 U/L (ref 15–41)
Albumin: 3.6 g/dL (ref 3.5–5.0)
Alkaline Phosphatase: 140 U/L — ABNORMAL HIGH (ref 38–126)
BUN: 25 mg/dL — ABNORMAL HIGH (ref 6–20)
CO2: 24 mmol/L (ref 22–32)
Calcium: 9 mg/dL (ref 8.9–10.3)
Chloride: >130 mmol/L (ref 101–111)
Creatinine, Ser: 0.89 mg/dL (ref 0.44–1.00)
GFR calc Af Amer: >60 mL/min (ref >60)
GFR calc non Af Amer: >60 mL/min (ref >60)
Glucose, Bld: 107 mg/dL — ABNORMAL HIGH (ref 70–99)
Potassium: 3.4 mmol/L — ABNORMAL LOW (ref 3.5–5.1)
Sodium: >180 mmol/L (ref 135–145)
Total Bilirubin: 0.6 mg/dL (ref 0.3–1.2)
Total Protein: 7.1 g/dL (ref 6.5–8.1)

## 2015-03-16 LAB — BASIC METABOLIC PANEL
BUN: 26 mg/dL — AB (ref 6–20)
BUN: 23 mg/dL — AB (ref 6–20)
BUN: 22 mg/dL — ABNORMAL HIGH (ref 6–20)
BUN: 21 mg/dL — ABNORMAL HIGH (ref 6–20)
CALCIUM: 8.3 mg/dL — AB (ref 8.9–10.3)
CO2: 23 mmol/L (ref 22–32)
CO2: 25 mmol/L (ref 22–32)
CO2: 24 mmol/L (ref 22–32)
CO2: 23 mmol/L (ref 22–32)
Calcium: 8.2 mg/dL — ABNORMAL LOW (ref 8.9–10.3)
Calcium: 8.7 mg/dL — ABNORMAL LOW (ref 8.9–10.3)
Calcium: 8.5 mg/dL — ABNORMAL LOW (ref 8.9–10.3)
Chloride: >130 mmol/L (ref 101–111)
Chloride: >130 mmol/L (ref 101–111)
Creatinine, Ser: 0.82 mg/dL (ref 0.44–1.00)
Creatinine, Ser: 0.77 mg/dL (ref 0.44–1.00)
Creatinine, Ser: 0.84 mg/dL (ref 0.44–1.00)
Creatinine, Ser: 0.76 mg/dL (ref 0.44–1.00)
GFR calc Af Amer: >60 mL/min (ref >60)
GFR calc Af Amer: >60 mL/min (ref >60)
GFR calc non Af Amer: >60 mL/min (ref >60)
GFR calc non Af Amer: >60 mL/min (ref >60)
GLUCOSE: 102 mg/dL — AB (ref 70–99)
Glucose, Bld: 121 mg/dL — ABNORMAL HIGH (ref 70–99)
Glucose, Bld: 92 mg/dL (ref 70–99)
Glucose, Bld: 90 mg/dL (ref 70–99)
POTASSIUM: 3 mmol/L — AB (ref 3.5–5.1)
Potassium: 3.3 mmol/L — ABNORMAL LOW (ref 3.5–5.1)
Potassium: 2.9 mmol/L — ABNORMAL LOW (ref 3.5–5.1)
Potassium: 3 mmol/L — ABNORMAL LOW (ref 3.5–5.1)
SODIUM: 176 mmol/L — AB (ref 135–145)
Sodium: 176 mmol/L (ref 135–145)
Sodium: 175 mmol/L (ref 135–145)
Sodium: 173 mmol/L (ref 135–145)

## 2015-03-16 LAB — CBC WITH DIFFERENTIAL/PLATELET
BASOS PCT: 0 % (ref 0–1)
Basophils Absolute: 0 10*3/uL (ref 0.0–0.1)
EOS ABS: 0.1 10*3/uL (ref 0.0–0.7)
Eosinophils Relative: 1 % (ref 0–5)
HEMATOCRIT: 39.8 % (ref 36.0–46.0)
HEMOGLOBIN: 12 g/dL (ref 12.0–15.0)
LYMPHS ABS: 3.7 10*3/uL (ref 0.7–4.0)
Lymphocytes Relative: 43 % (ref 12–46)
MCH: 30 pg (ref 26.0–34.0)
MCHC: 30.2 g/dL (ref 30.0–36.0)
MCV: 99.5 fL (ref 78.0–100.0)
MONOS PCT: 4 % (ref 3–12)
Monocytes Absolute: 0.3 10*3/uL (ref 0.1–1.0)
NEUTROS PCT: 52 % (ref 43–77)
Neutro Abs: 4.4 10*3/uL (ref 1.7–7.7)
Platelets: 112 10*3/uL — ABNORMAL LOW (ref 150–400)
RBC: 4 MIL/uL (ref 3.87–5.11)
RDW: 14.9 % (ref 11.5–15.5)
WBC: 8.6 10*3/uL (ref 4.0–10.5)

## 2015-03-16 LAB — URINALYSIS, ROUTINE W REFLEX MICROSCOPIC
Bilirubin Urine: NEGATIVE
Glucose, UA: NEGATIVE mg/dL
HGB URINE DIPSTICK: NEGATIVE
KETONES UR: NEGATIVE mg/dL
LEUKOCYTES UA: NEGATIVE
Nitrite: NEGATIVE
Protein, ur: NEGATIVE mg/dL
Specific Gravity, Urine: 1.02 (ref 1.005–1.030)
Urobilinogen, UA: 0.2 mg/dL (ref 0.0–1.0)
pH: 5 (ref 5.0–8.0)

## 2015-03-16 LAB — MAGNESIUM: Magnesium: 2.5 mg/dL — ABNORMAL HIGH (ref 1.7–2.4)

## 2015-03-16 LAB — I-STAT CG4 LACTIC ACID, ED: Lactic Acid, Venous: 1.26 mmol/L (ref 0.5–2.0)

## 2015-03-16 LAB — MRSA PCR SCREENING: MRSA by PCR: POSITIVE — AB

## 2015-03-16 MED ORDER — DEXTROSE 5 % IV SOLN
2.0000 g | Freq: Once | INTRAVENOUS | Status: AC
  Administered 2015-03-16: 2 g via INTRAVENOUS
  Filled 2015-03-16: qty 2

## 2015-03-16 MED ORDER — CHLORHEXIDINE GLUCONATE 0.12 % MT SOLN: 15.0000 mL | Freq: Two times a day (BID) | OROMUCOSAL | Status: DC

## 2015-03-16 MED ORDER — SODIUM CHLORIDE 0.45 % IV BOLUS
1000.0000 mL | Freq: Once | INTRAVENOUS | Status: AC
  Administered 2015-03-16: 1000 mL via INTRAVENOUS

## 2015-03-16 MED ORDER — LEVETIRACETAM 100 MG/ML PO SOLN
1500.0000 mg | Freq: Two times a day (BID) | ORAL | Status: DC
  Filled 2015-03-16: qty 15

## 2015-03-16 MED ORDER — VANCOMYCIN HCL 10 G IV SOLR
2000.0000 mg | INTRAVENOUS | Status: AC
  Administered 2015-03-16: 2000 mg via INTRAVENOUS
  Filled 2015-03-16: qty 2000

## 2015-03-16 MED ORDER — LACOSAMIDE 50 MG PO TABS
100.0000 mg | ORAL_TABLET | Freq: Two times a day (BID) | ORAL | Status: DC
  Administered 2015-03-16: 100 mg via ORAL
  Filled 2015-03-16 (×3): qty 2

## 2015-03-16 MED ORDER — SODIUM CHLORIDE 0.9 % IV BOLUS (SEPSIS): 1000.0000 mL | INTRAVENOUS | Status: DC

## 2015-03-16 MED ORDER — VANCOMYCIN HCL IN DEXTROSE 1-5 GM/200ML-% IV SOLN: 1000.0000 mg | Freq: Once | INTRAVENOUS | Status: DC

## 2015-03-16 MED ORDER — SODIUM CHLORIDE 0.45 % IV SOLN
INTRAVENOUS | Status: DC
  Administered 2015-03-16: 18:00:00 via INTRAVENOUS

## 2015-03-16 MED ORDER — PHENYTOIN SODIUM EXTENDED 100 MG PO CAPS: 200.0000 mg | ORAL_CAPSULE | Freq: Two times a day (BID) | ORAL | Status: DC

## 2015-03-16 MED ORDER — MUPIROCIN 2 % EX OINT
1.0000 "application " | TOPICAL_OINTMENT | Freq: Two times a day (BID) | CUTANEOUS | Status: AC
  Administered 2015-03-16 – 2015-03-21 (×10): 1 via NASAL
  Filled 2015-03-16: qty 22

## 2015-03-16 MED ORDER — CETYLPYRIDINIUM CHLORIDE 0.05 % MT LIQD: 7.0000 mL | Freq: Two times a day (BID) | OROMUCOSAL | Status: DC

## 2015-03-16 MED ORDER — LEVETIRACETAM 500 MG PO TABS
1500.0000 mg | ORAL_TABLET | Freq: Two times a day (BID) | ORAL | Status: DC
  Administered 2015-03-16: 1500 mg via ORAL
  Filled 2015-03-16 (×2): qty 3

## 2015-03-16 MED ORDER — POTASSIUM CHLORIDE 10 MEQ/100ML IV SOLN
10.0000 meq | INTRAVENOUS | Status: AC
  Administered 2015-03-16 (×3): 10 meq via INTRAVENOUS
  Filled 2015-03-16: qty 100

## 2015-03-16 MED ORDER — VANCOMYCIN HCL IN DEXTROSE 750-5 MG/150ML-% IV SOLN
750.0000 mg | Freq: Two times a day (BID) | INTRAVENOUS | Status: DC
  Filled 2015-03-16: qty 150

## 2015-03-16 MED ORDER — SODIUM CHLORIDE 0.9 % IV BOLUS (SEPSIS): 500.0000 mL | INTRAVENOUS | Status: DC

## 2015-03-16 MED ORDER — ACETAMINOPHEN 325 MG PO TABS
650.0000 mg | ORAL_TABLET | Freq: Once | ORAL | Status: AC
  Administered 2015-03-16: 650 mg via ORAL
  Filled 2015-03-16: qty 2

## 2015-03-16 MED ORDER — PHENYTOIN 50 MG PO CHEW
200.0000 mg | CHEWABLE_TABLET | Freq: Two times a day (BID) | ORAL | Status: DC
  Administered 2015-03-16: 200 mg via ORAL
  Filled 2015-03-16 (×3): qty 4

## 2015-03-16 MED ORDER — CHLORHEXIDINE GLUCONATE CLOTH 2 % EX PADS: 6.0000 | MEDICATED_PAD | Freq: Every day | CUTANEOUS | Status: DC

## 2015-03-16 NOTE — ED Notes (Signed)
Per EMS, [facility], presents due to increased fatigue and lethargy x 1 week.  Staff reports "patient is at neuro baseline, but has been sleeping more than usual."  Pt non-verbal and quadriplegic.  Pt recently had G tube removed.  When asked about pain, Pt nods "yes."  When asked about specific areas, Pt does not acknowledge any pain site.  Pt given 500mg  Tylenol prior to transfer.

## 2015-03-16 NOTE — Progress Notes (Addendum)
ANTIBIOTIC CONSULT NOTE - INITIAL  Pharmacy Consult for vancomycin Indication: sepsis  Allergies  Allergen Reactions  . Amlodipine Swelling  . Penicillins Swelling  . Latex Other (See Comments)    Unknown   . Other Other (See Comments)    Natural Rubber- Unknown     Patient Measurements: Weight: 230 lb (104.327 kg) Adjusted Body Weight:   Vital Signs: Temp: 100.6 F (38.1 C) (05/11 1027) Temp Source: Rectal (05/11 1027) BP: 108/81 mmHg (05/11 1027) Pulse Rate: 97 (05/11 1027) Intake/Output from previous day:   Intake/Output from this shift:    Labs: No results for input(s): WBC, HGB, PLT, LABCREA, CREATININE in the last 72 hours. CrCl cannot be calculated (Unknown ideal weight.). No results for input(s): VANCOTROUGH, VANCOPEAK, VANCORANDOM, GENTTROUGH, GENTPEAK, GENTRANDOM, TOBRATROUGH, TOBRAPEAK, TOBRARND, AMIKACINPEAK, AMIKACINTROU, AMIKACIN in the last 72 hours.   Microbiology: No results found for this or any previous visit (from the past 720 hour(s)).  Medical History: Past Medical History  Diagnosis Date  . Hypertension   . SAH (subarachnoid hemorrhage)   . Seizure   . Stroke   . Tracheostomy status   . Tracheomalacia   . DVT (deep venous thrombosis)    Assessment: [age] YOF presents with fatigue and lethargy.  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  Temperature in ED is 100.6. Pharmacy asked to dose vancomycin for sepsis.   5/11 >> vancomycin >> 5/11 >> cefepime x 1 >>  4/5 urine: NG 5/11 urine: ordered 5/11 Blood: ordered  Per recent labs (02/07/15):  WBC elevated  SCr is WNL but double previous result from ~7836mo ago.  With functional quadriplegia, SCr will overestimate actual renal function.   - Normalized CrCl = 5779ml/min adjusting SCr to 1 mg/dl   Goal of Therapy:  Vancomycin trough level 15-20 mcg/ml  Plan:   Change vancomycin to 2gm IV x 1 then 750mg  IV q12h  Check  vancomycin trough at steady state d/t difficult to predict pharmacokinetics   Follow urine output and extend dosing interval as appropriate as SCr is double recent value  Follow-up plan to continue cefepime, etc.  Noted PCN allergy but appears to have tolerated cefepime in 2015   Follow-up labs just collected  Juliette Alcideustin Zeigler, PharmD, BCPS.   Pager: [phone]  03/16/2015,11:24 AM

## 2015-03-16 NOTE — ED Notes (Signed)
Bed: WA18 Expected date:  Expected time:  Means of arrival:  Comments: ems 

## 2015-03-16 NOTE — ED Provider Notes (Signed)
CSN: [id number]     Arrival date & time 03/16/15  1019 History   First MD Initiated Contact with Patient 03/16/15 1025     Chief Complaint  Patient presents with  . Fatigue     (Consider location/radiation/quality/duration/timing/severity/associated sxs/prior Treatment) Patient is a 49 y.o. female presenting with neurologic complaint. The history is provided by a caregiver (stacy).  Neurologic Problem This is a new problem. The current episode started more than 2 days ago. The problem occurs constantly. The problem has not changed since onset.Pertinent negatives include no chest pain, no abdominal pain, no headaches and no shortness of breath. Nothing aggravates the symptoms. Nothing relieves the symptoms. She has tried nothing for the symptoms. The treatment provided no relief.    Past Medical History  Diagnosis Date  . Hypertension   . SAH (subarachnoid hemorrhage)   . Seizure   . Stroke   . Tracheostomy status   . Tracheomalacia   . DVT (deep venous thrombosis)    Past Surgical History  Procedure Laterality Date  . Abdominal hysterectomy    . Cholecystectomy     Family History  Problem Relation Age of Onset  . Heart disease Mother 36    MI   History  Substance Use Topics  . Smoking status: Former Smoker    Types: Cigarettes  . Smokeless tobacco: Never Used     Comment: trying to quit-2 cigarettes daily  . Alcohol Use: No     Comment: Very seldom   OB History    No data available     Review of Systems  Constitutional: Positive for fever. Negative for fatigue.  HENT: Negative for congestion and drooling.   Eyes: Negative for pain.  Respiratory: Negative for cough and shortness of breath.   Cardiovascular: Negative for chest pain.  Gastrointestinal: Negative for nausea, vomiting, abdominal pain and diarrhea.  Genitourinary: Negative for dysuria and hematuria.  Musculoskeletal: Negative for back pain, gait problem and neck pain.  Skin: Negative for color  change.  Neurological: Negative for dizziness and headaches.       AMS  Hematological: Negative for adenopathy.  Psychiatric/Behavioral: Negative for behavioral problems.  All other systems reviewed and are negative.     Allergies  Amlodipine; Penicillins; Latex; and Other  Home Medications   Prior to Admission medications   Medication Sig Start Date End Date Taking? Authorizing Provider  acetaminophen (TYLENOL) 325 MG tablet Give 650 mg by tube daily.    Yes Historical Provider, MD  acetaminophen (TYLENOL) 325 MG tablet Take 650 mg by mouth every 4 (four) hours as needed (for elevated temperatures).   Yes Historical Provider, MD  carboxymethylcellulose (REFRESH PLUS) 0.5 % SOLN Place 2 drops into both eyes 3 (three) times daily.   Yes Historical Provider, MD  Cholecalciferol (VITAMIN D3) 5000 UNITS TABS Take 1 tablet by mouth daily with breakfast.   Yes Historical Provider, MD  cholestyramine (QUESTRAN) 4 G packet Place 4 g into feeding tube 3 (three) times daily.    Yes Historical Provider, MD  cloNIDine (CATAPRES) 0.2 MG tablet Take 1 tablet (0.2 mg total) by mouth 3 (three) times daily. 04/07/14  Yes Jeanella Craze, NP  famotidine (PEPCID) 40 MG/5ML suspension Place 2.5 mLs (20 mg total) into feeding tube every 12 (twelve) hours. 04/07/14  Yes Jeanella Craze, NP  lacosamide (VIMPAT) 50 MG TABS tablet Give 100 mg by tube 2 (two) times daily.    Yes Historical Provider, MD  levETIRAcetam (KEPPRA) 750 MG tablet  Take 1,500 mg by mouth 2 (two) times daily.   Yes Historical Provider, MD  meloxicam (MOBIC) 7.5 MG tablet Take 7.5 mg by mouth daily.   Yes Historical Provider, MD  metoprolol (LOPRESSOR) 100 MG tablet Place 1 tablet (100 mg total) into feeding tube 2 (two) times daily. 04/07/14  Yes Jeanella CrazeBrandi L Ollis, NP  minoxidil (LONITEN) 10 MG tablet Place 1 tablet (10 mg total) into feeding tube 2 (two) times daily. 04/07/14  Yes Jeanella CrazeBrandi L Ollis, NP  Multiple Vitamin (MULTIVITAMIN WITH MINERALS) TABS  tablet Take 1 tablet by mouth daily with breakfast.   Yes Historical Provider, MD  phenytoin (DILANTIN) 100 MG ER capsule Take 200 mg by mouth 2 (two) times daily.   Yes Historical Provider, MD  traZODone (DESYREL) 50 MG tablet Take 25 mg by mouth at bedtime as needed.    Yes Historical Provider, MD  vitamin C (ASCORBIC ACID) 500 MG tablet 500 mg by PEG Tube route daily.    Yes Historical Provider, MD  albuterol (PROVENTIL) (2.5 MG/3ML) 0.083% nebulizer solution Take 3 mLs (2.5 mg total) by nebulization every 6 (six) hours. Patient not taking: Reported on 03/16/2015 04/07/14   Jeanella CrazeBrandi L Ollis, NP  antiseptic oral rinse (BIOTENE) LIQD 15 mLs by Mouth Rinse route 2 times daily at 12 noon and 4 pm. Patient not taking: Reported on 02/07/2015 04/07/14   Jeanella CrazeBrandi L Ollis, NP  chlorhexidine (PERIDEX) 0.12 % solution 15 mLs by Mouth Rinse route 2 (two) times daily. Patient not taking: Reported on 02/07/2015 04/07/14   Jeanella CrazeBrandi L Ollis, NP  levofloxacin (LEVAQUIN) 500 MG tablet Take 1 tablet (500 mg total) by mouth daily. Patient not taking: Reported on 03/16/2015 02/07/15   Rolland PorterMark James, MD  Nutritional Supplements (FEEDING SUPPLEMENT, GLUCERNA 1.2 CAL,) LIQD Place 1,000 mLs into feeding tube continuous. Infuse at 50 ml /hr Patient not taking: Reported on 02/07/2015 04/07/14   Jeanella CrazeBrandi L Ollis, NP  Water For Irrigation, Sterile (FREE WATER) SOLN Place 200 mLs into feeding tube every 6 (six) hours. Patient not taking: Reported on 03/16/2015 04/07/14   Jeanella CrazeBrandi L Ollis, NP   BP 108/81 mmHg  Pulse 97  Temp(Src) 100.6 F (38.1 C) (Rectal)  Resp 15  SpO2 93% Physical Exam  Constitutional: She appears well-developed and well-nourished.  HENT:  Head: Normocephalic.  Mouth/Throat: Oropharynx is clear and moist. No oropharyngeal exudate.  Eyes: Conjunctivae and EOM are normal. Pupils are equal, round, and reactive to light.  Neck: Normal range of motion. Neck supple.  Cardiovascular: Normal rate, regular rhythm, normal heart sounds  and intact distal pulses.  Exam reveals no gallop and no friction rub.   No murmur heard. Pulmonary/Chest: Effort normal and breath sounds normal. No respiratory distress. She has no wheezes.  Abdominal: Soft. Bowel sounds are normal. There is no tenderness. There is no rebound and no guarding.  Musculoskeletal: Normal range of motion. She exhibits no edema or tenderness.  Chronic appearing sacral wound and wound below the left buttock. Otherwise normal appearance of back.  Neurological: She is alert.  The patient is alert and will track me with her eyes. She shakes her head from side to side but will not follow commands.  Skin: Skin is warm and dry.  Psychiatric: She has a normal mood and affect. Her behavior is normal.  Nursing note and vitals reviewed.   ED Course  Procedures (including critical care time) Labs Review Labs Reviewed  MRSA PCR SCREENING - Abnormal; Notable for the following:  MRSA by PCR POSITIVE (*)    All other components within normal limits  COMPREHENSIVE METABOLIC PANEL - Abnormal; Notable for the following:    Sodium >180 (*)    Potassium 3.4 (*)    Chloride >130 (*)    Glucose, Bld 107 (*)    BUN 25 (*)    Alkaline Phosphatase 140 (*)    All other components within normal limits  URINALYSIS, ROUTINE W REFLEX MICROSCOPIC - Abnormal; Notable for the following:    APPearance CLOUDY (*)    All other components within normal limits  CBC WITH DIFFERENTIAL/PLATELET - Abnormal; Notable for the following:    Platelets 112 (*)    All other components within normal limits  BASIC METABOLIC PANEL - Abnormal; Notable for the following:    Sodium 176 (*)    Potassium 3.3 (*)    Chloride >130 (*)    Glucose, Bld 121 (*)    BUN 26 (*)    Calcium 8.3 (*)    All other components within normal limits  CBC - Abnormal; Notable for the following:    RBC 3.69 (*)    Hemoglobin 10.5 (*)    MCHC 28.5 (*)    Platelets 96 (*)    All other components within normal limits    BASIC METABOLIC PANEL - Abnormal; Notable for the following:    Sodium 175 (*)    Potassium 2.9 (*)    Chloride >130 (*)    BUN 23 (*)    Calcium 8.2 (*)    All other components within normal limits  BASIC METABOLIC PANEL - Abnormal; Notable for the following:    Sodium 176 (*)    Potassium 3.0 (*)    Chloride >130 (*)    BUN 22 (*)    Calcium 8.7 (*)    All other components within normal limits  BASIC METABOLIC PANEL - Abnormal; Notable for the following:    Sodium 173 (*)    Potassium 3.0 (*)    Chloride >130 (*)    Glucose, Bld 102 (*)    BUN 21 (*)    Calcium 8.5 (*)    All other components within normal limits  BASIC METABOLIC PANEL - Abnormal; Notable for the following:    Sodium 174 (*)    Potassium 3.2 (*)    Chloride >130 (*)    BUN 21 (*)    Calcium 8.4 (*)    All other components within normal limits  BASIC METABOLIC PANEL - Abnormal; Notable for the following:    Sodium 173 (*)    Potassium 3.1 (*)    Chloride >130 (*)    Calcium 8.5 (*)    All other components within normal limits  BASIC METABOLIC PANEL - Abnormal; Notable for the following:    Sodium 173 (*)    Potassium 3.1 (*)    Chloride >130 (*)    Calcium 8.7 (*)    All other components within normal limits  MAGNESIUM - Abnormal; Notable for the following:    Magnesium 2.5 (*)    All other components within normal limits  CULTURE, BLOOD (ROUTINE X 2)  CULTURE, BLOOD (ROUTINE X 2)  URINE CULTURE  CBC WITH DIFFERENTIAL/PLATELET  BASIC METABOLIC PANEL  BASIC METABOLIC PANEL  BASIC METABOLIC PANEL  BASIC METABOLIC PANEL  BASIC METABOLIC PANEL  BASIC METABOLIC PANEL  BASIC METABOLIC PANEL  BASIC METABOLIC PANEL  BASIC METABOLIC PANEL  I-STAT CG4 LACTIC ACID, ED    Imaging Review  Dg Chest Port 1 View  03/16/2015   CLINICAL DATA:  One day history of shortness of breath  EXAM: PORTABLE CHEST - 1 VIEW  COMPARISON:  February 07, 2015  FINDINGS: There is no edema or consolidation. Heart size and  pulmonary vascularity are normal. No adenopathy. No bone lesions.  IMPRESSION: No edema or consolidation.   Electronically Signed   By: Bretta BangWilliam  Woodruff III M.D.   On: 03/16/2015 11:10     EKG Interpretation   Date/Time:  Wednesday Mar 16 2015 10:38:59 EDT Ventricular Rate:  93 PR Interval:  157 QRS Duration: 75 QT Interval:  373 QTC Calculation: 464 R Axis:   10 Text Interpretation:  Sinus rhythm Abnormal R-wave progression, early  transition No significant change since last tracing Confirmed by Avyana Puffenbarger   MD, Teneil Shiller ([id number]) on 03/16/2015 11:29:04 AM         CRITICAL CARE Performed by: Purvis SheffieldHARRISON, Saanvi Hakala, S Total critical care time: 30 min Critical care time was exclusive of separately billable procedures and treating other patients. Critical care was necessary to treat or prevent imminent or life-threatening deterioration. Critical care was time spent personally by me on the following activities: development of treatment plan with patient and/or surrogate as well as nursing, discussions with consultants, evaluation of patient's response to treatment, examination of patient, obtaining history from patient or surrogate, ordering and performing treatments and interventions, ordering and review of laboratory studies, ordering and review of radiographic studies, pulse oximetry and re-evaluation of patient's condition.  MDM   Final diagnoses:  Sepsis  Other specified fever  Hypernatremia  Hyperchloremia    11:21 AM 49 y.o. female w hx of HTN, SAH, CVA (non-verbal at baseline), quadriplegic, DVT, seizures who presents with worsening mental status, hypernatremia, hyperchloremia. [facility] who [facility] the patient's nurse. She states that at baseline the patient can answer simple questions by nodding her head or smiling but may not do this with strangers. She states that the patient had her G-tube removed about one month ago due to infection and has been  taking medications and food orally since then. She notes that the patient used to get free water through her G-tube but has not been able to drink as much water now that she is taking po. she states that lab work yesterday showed a sodium of 179 and chloride greater than 130. She also notes slight worsening of the patient's mental status. The patient had a low-grade rectal temperature here. Her nurse states that she has chronic low-grade temperatures of unknown source. Mildly elevated RR and HR, vital signs otherwise unremarkable. Sepsis protocol initiated. Will give a bolus of half normal saline initially due to known hypernatremia.  Pt admitted for gross lab abnormalities w/ Na >180 and Cl >130. She got 2 L of 0.5 NaCl IVF in the ED. Broad spectrum Abx d/t low grade fever, RR >22 and change in mental status.    Purvis SheffieldForrest Kesa Birky, MD 03/17/15 [phone]

## 2015-03-16 NOTE — ED Notes (Signed)
MD at bedside. 

## 2015-03-16 NOTE — ED Notes (Signed)
Per facility, Pt takes medications "crushed and in applesauce."

## 2015-03-16 NOTE — Progress Notes (Signed)
CRITICAL VALUE ALERT  Critical value received:  Na+ 176    Cl >150  Date of notification: 5/11  Time of notification:  1932  Critical value read back:Yes.    Nurse who received alert:  Max SaneMackenzie Amaziah Ghosh, RN  MD notified (1st page):  [facility] floor coverage  Time of first page:  [phone]

## 2015-03-16 NOTE — H&P (Signed)
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  Pt is nonverbal at baseline.  She had a gtube up until last month and has been removed for unknown reasons.  Pt has not been able to drink or eat enough since per SNF report.  Labs were checked which were markedly abnormal so patient was sent to ED for further evaluation.  Pt is surprising alert and appears to be at her baseline.  Her na level is over 180.    Review of Systems:  Unobtainable from patient  Past Medical History: Past Medical History  Diagnosis Date  . Hypertension   . SAH (subarachnoid hemorrhage)   . Seizure   . Stroke   . Tracheostomy status   . Tracheomalacia   . DVT (deep venous thrombosis)    Past Surgical History  Procedure Laterality Date  . Abdominal hysterectomy    . Cholecystectomy      Medications: Prior to Admission medications   Medication Sig Start Date End Date Taking? Authorizing Provider  acetaminophen (TYLENOL) 325 MG tablet Give 650 mg by tube daily.    Yes Historical Provider, MD  acetaminophen (TYLENOL) 325 MG tablet Take 650 mg by mouth every 4 (four) hours as needed (for elevated temperatures).   Yes Historical Provider, MD  carboxymethylcellulose (REFRESH PLUS) 0.5 % SOLN Place 2 drops into both eyes 3 (three) times daily.   Yes Historical Provider, MD  Cholecalciferol (VITAMIN D3) 5000 UNITS TABS Take 1 tablet by mouth daily with breakfast.   Yes Historical Provider, MD  cholestyramine (QUESTRAN) 4 G packet Place 4 g into feeding tube 3 (three) times daily.    Yes Historical Provider, MD  cloNIDine (CATAPRES) 0.2 MG tablet Take 1 tablet (0.2 mg total) by mouth 3 (three) times daily. 04/07/14  Yes Jeanella CrazeBrandi L Ollis, NP  famotidine (PEPCID) 40 MG/5ML suspension Place 2.5 mLs (20 mg total) into feeding tube every 12  (twelve) hours. 04/07/14  Yes Jeanella CrazeBrandi L Ollis, NP  lacosamide (VIMPAT) 50 MG TABS tablet Give 100 mg by tube 2 (two) times daily.    Yes Historical Provider, MD  levETIRAcetam (KEPPRA) 750 MG tablet Take 1,500 mg by mouth 2 (two) times daily.   Yes Historical Provider, MD  meloxicam (MOBIC) 7.5 MG tablet Take 7.5 mg by mouth daily.   Yes Historical Provider, MD  metoprolol (LOPRESSOR) 100 MG tablet Place 1 tablet (100 mg total) into feeding tube 2 (two) times daily. 04/07/14  Yes Jeanella CrazeBrandi L Ollis, NP  minoxidil (LONITEN) 10 MG tablet Place 1 tablet (10 mg total) into feeding tube 2 (two) times daily. 04/07/14  Yes Jeanella CrazeBrandi L Ollis, NP  Multiple Vitamin (MULTIVITAMIN WITH MINERALS) TABS tablet Take 1 tablet by mouth daily with breakfast.   Yes Historical Provider, MD  phenytoin (DILANTIN) 100 MG ER capsule Take 200 mg by mouth 2 (two) times daily.   Yes Historical Provider, MD  traZODone (DESYREL) 50 MG tablet Take 25 mg by mouth at bedtime as needed.    Yes Historical Provider, MD  vitamin C (ASCORBIC ACID) 500 MG tablet 500 mg by PEG Tube route daily.    Yes Historical Provider, MD  albuterol (PROVENTIL) (2.5 MG/3ML) 0.083% nebulizer solution Take 3 mLs (2.5 mg total) by nebulization every 6 (six) hours. Patient not taking: Reported on 03/16/2015 04/07/14  Jeanella Craze, NP  antiseptic oral rinse (BIOTENE) LIQD 15 mLs by Mouth Rinse route 2 times daily at 12 noon and 4 pm. Patient not taking: Reported on 02/07/2015 04/07/14   Jeanella Craze, NP  chlorhexidine (PERIDEX) 0.12 % solution 15 mLs by Mouth Rinse route 2 (two) times daily. Patient not taking: Reported on 02/07/2015 04/07/14   Jeanella Craze, NP  levofloxacin (LEVAQUIN) 500 MG tablet Take 1 tablet (500 mg total) by mouth daily. Patient not taking: Reported on 03/16/2015 02/07/15   Rolland Porter, MD  Nutritional Supplements (FEEDING SUPPLEMENT, GLUCERNA 1.2 CAL,) LIQD Place 1,000 mLs into feeding tube continuous. Infuse at 50 ml /hr Patient not taking: Reported on  02/07/2015 04/07/14   Jeanella Craze, NP  Water For Irrigation, Sterile (FREE WATER) SOLN Place 200 mLs into feeding tube every 6 (six) hours. Patient not taking: Reported on 03/16/2015 04/07/14   Jeanella Craze, NP    Allergies:   Allergies  Allergen Reactions  . Amlodipine Swelling  . Penicillins Swelling  . Latex Other (See Comments)    Unknown   . Other Other (See Comments)    Natural Rubber- Unknown     Social History:  reports that she has quit smoking. Her smoking use included Cigarettes. She has never used smokeless tobacco. She reports that she does not drink alcohol or use illicit drugs.  Family History: Family History  Problem Relation Age of Onset  . Heart disease Mother 35    MI    Physical Exam: Filed Vitals:   03/16/15 1600 03/16/15 1700 03/16/15 1723 03/16/15 1800  BP: 112/91 112/75  125/68  Pulse: 104 103  99  Temp:   99.2 F (37.3 C)   TempSrc:   Axillary   Resp: Weight:      SpO2: 96% 95%  95%   General appearance: alert, no distress and slowed mentation Head: Normocephalic, without obvious abnormality, atraumatic Eyes: negative Nose: Nares normal. Septum midline. Mucosa normal. No drainage or sinus tenderness. Neck: no JVD and supple, symmetrical, trachea midline Lungs: clear to auscultation bilaterally Heart: regular rate and rhythm, S1, S2 normal, no murmur, click, rub or gallop Abdomen: soft, non-tender; bowel sounds normal; no masses,  no organomegaly Extremities: extremities normal, atraumatic, no cyanosis or edema Pulses: 2+ and symmetric Skin: Skin color, texture, turgor normal. No rashes or lesions  Several decub wounds see nurses notes Neurologic: quadraplegic, nonverbal, profound neurological deficits chronically  Appears to say yes or no but not consistent with questions    Labs on Admission:   Recent Labs  03/16/15 1134  NA >180*  K 3.4*  CL >130*  CO2 24  GLUCOSE 107*  BUN 25*  CREATININE 0.89  CALCIUM 9.0     Recent Labs  03/16/15 1134  AST 31  ALT 34  ALKPHOS 140*  BILITOT 0.6  PROT 7.1  ALBUMIN 3.6    Recent Labs  03/16/15 1417  WBC 8.6  NEUTROABS 4.4  HGB 12.0  HCT 39.8  MCV 99.5  PLT 112*   Radiological Exams on Admission: Ct Head Wo Contrast  03/16/2015   CLINICAL DATA:  One week history of increased lethargy.  EXAM: CT HEAD WITHOUT CONTRAST  TECHNIQUE: Contiguous axial images were obtained from the base of the skull through the vertex without intravenous contrast.  COMPARISON:  Head CT July 07, 2014 and July 27, 2014 brain MRI  FINDINGS: There is extensive ex vacuo phenomenon in both frontal lobes  with mild residua of prior hemorrhage in these areas. The blood breakdown products in these areas are much better seen on MR than CT. The encephalomalacia extends along the medial frontal lobes to the vertex and extends posteriorly into the medial proximal parietal lobes, stable. There is lateral and third ventricular enlargement, stable. There is an aneurysm clip just inferior and anterior to the third ventricle, extending slightly toward the left, stable. There is no intracranial mass or acute hemorrhage. There is no extra-axial fluid collection or midline shift. There is small vessel disease in the centra semiovale bilaterally, more on the left than on the right with prior infarct in the left frontal lobe adjacent to the encephalomalacia, stable. No new gray-white compartment lesions are identified. There is evidence of prior craniotomy in the left frontal lobe region. No new bone lesions are identified. Mastoid air cells bilaterally are clear.  IMPRESSION: Prior infarcts and encephalomalacia with bilateral medial frontal and anterior medial parietal lobe encephalomalacia with residua of prior hemorrhage is in these areas. No acute hemorrhage is seen on this study. No new gray-white compartment lesion. No acute infarct. No mass or mass effect. Stable atrophy. Postoperative changes  noted.   Electronically Signed   By: Bretta BangWilliam  Woodruff III M.D.   On: 03/16/2015 13:32   Dg Chest Port 1 View  03/16/2015   CLINICAL DATA:  One day history of shortness of breath  EXAM: PORTABLE CHEST - 1 VIEW  COMPARISON:  February 07, 2015  FINDINGS: There is no edema or consolidation. Heart size and pulmonary vascularity are normal. No adenopathy. No bone lesions.  IMPRESSION: No edema or consolidation.   Electronically Signed   By: Bretta BangWilliam  Woodruff III M.D.   On: 03/16/2015 11:10    Assessment/Plan  49 yo female with quadraplegia/previous stoke with dehydration and severe hypernatremia from inadequate po intake  Principal Problem:   Hypernatremia-  Has received 2 liters of fluid bolus of 1/2 ns.  Repeat bmp not back yet.  Will place on 1/2 ns at 100cc/hour for now and adjust fluids as bmp comes back.  Ordered q 2 hour bmp and ordered to be called to floor coverage tonight.  Also put in IR consult to evaluate replacing gtube.  Does not appear to be having much neurological deficits from the hypernatremia at this time, but difficult to tell due to her profound baseline deficits.   Active Problems:   HTN (hypertension)-  stable   Brain aneurysm- noted   Seizures-  Seizure precautiions   Stroke   Dehydration  Full code per paperwork from SNF.  Admit to stepdown.  Medications are not reconciled yet by pharmacy.  Kymber Kosar A 03/16/2015, 6:21 PM

## 2015-03-17 ENCOUNTER — Inpatient Hospital Stay (HOSPITAL_COMMUNITY): Payer: 59

## 2015-03-17 DIAGNOSIS — R569 Unspecified convulsions: Secondary | ICD-10-CM

## 2015-03-17 DIAGNOSIS — E669 Obesity, unspecified: Secondary | ICD-10-CM

## 2015-03-17 DIAGNOSIS — I10 Essential (primary) hypertension: Secondary | ICD-10-CM

## 2015-03-17 DIAGNOSIS — R1310 Dysphagia, unspecified: Secondary | ICD-10-CM

## 2015-03-17 LAB — BASIC METABOLIC PANEL
BUN: 21 mg/dL — AB (ref 6–20)
BUN: 20 mg/dL (ref 6–20)
BUN: 19 mg/dL (ref 6–20)
BUN: 17 mg/dL (ref 6–20)
CALCIUM: 8.5 mg/dL — AB (ref 8.9–10.3)
CO2: 24 mmol/L (ref 22–32)
CO2: 24 mmol/L (ref 22–32)
CO2: 24 mmol/L (ref 22–32)
CO2: 23 mmol/L (ref 22–32)
CREATININE: 0.74 mg/dL (ref 0.44–1.00)
CREATININE: 0.76 mg/dL (ref 0.44–1.00)
Calcium: 8.4 mg/dL — ABNORMAL LOW (ref 8.9–10.3)
Calcium: 8.7 mg/dL — ABNORMAL LOW (ref 8.9–10.3)
Calcium: 8.8 mg/dL — ABNORMAL LOW (ref 8.9–10.3)
Chloride: >130 mmol/L (ref 101–111)
Chloride: >130 mmol/L (ref 101–111)
Chloride: >130 mmol/L (ref 101–111)
Creatinine, Ser: 0.69 mg/dL (ref 0.44–1.00)
Creatinine, Ser: 0.74 mg/dL (ref 0.44–1.00)
GFR calc Af Amer: >60 mL/min (ref >60)
GFR calc Af Amer: >60 mL/min (ref >60)
GFR calc Af Amer: >60 mL/min (ref >60)
GFR calc non Af Amer: >60 mL/min (ref >60)
GFR calc non Af Amer: >60 mL/min (ref >60)
GFR calc non Af Amer: >60 mL/min (ref >60)
GLUCOSE: 89 mg/dL (ref 65–99)
GLUCOSE: 88 mg/dL (ref 65–99)
Glucose, Bld: 92 mg/dL (ref 65–99)
Glucose, Bld: 90 mg/dL (ref 65–99)
POTASSIUM: 3.1 mmol/L — AB (ref 3.5–5.1)
POTASSIUM: 3.1 mmol/L — AB (ref 3.5–5.1)
Potassium: 3.2 mmol/L — ABNORMAL LOW (ref 3.5–5.1)
Potassium: 3.6 mmol/L (ref 3.5–5.1)
SODIUM: 173 mmol/L — AB (ref 135–145)
Sodium: 174 mmol/L (ref 135–145)
Sodium: 173 mmol/L (ref 135–145)
Sodium: 172 mmol/L (ref 135–145)

## 2015-03-17 LAB — CBC
HEMATOCRIT: 36.8 % (ref 36.0–46.0)
HEMOGLOBIN: 10.5 g/dL — AB (ref 12.0–15.0)
MCH: 28.5 pg (ref 26.0–34.0)
MCHC: 28.5 g/dL — AB (ref 30.0–36.0)
MCV: 99.7 fL (ref 78.0–100.0)
Platelets: 96 10*3/uL — ABNORMAL LOW (ref 150–400)
RBC: 3.69 MIL/uL — AB (ref 3.87–5.11)
RDW: 14.7 % (ref 11.5–15.5)
WBC: 6 10*3/uL (ref 4.0–10.5)

## 2015-03-17 LAB — URINE CULTURE
CULTURE: NO GROWTH
Colony Count: NO GROWTH

## 2015-03-17 MED ORDER — IOHEXOL 300 MG/ML  SOLN
25.0000 mL | Freq: Once | INTRAMUSCULAR | Status: AC | PRN
Start: 2015-03-17 — End: 2015-03-17

## 2015-03-17 MED ORDER — NITROGLYCERIN 2 % TD OINT
0.5000 [in_us] | TOPICAL_OINTMENT | Freq: Four times a day (QID) | TRANSDERMAL | Status: DC
  Administered 2015-03-17: 0.5 [in_us] via TOPICAL
  Filled 2015-03-17: qty 30

## 2015-03-17 MED ORDER — POTASSIUM CHLORIDE 10 MEQ/100ML IV SOLN
10.0000 meq | INTRAVENOUS | Status: AC
  Administered 2015-03-17 (×3): 10 meq via INTRAVENOUS
  Filled 2015-03-17 (×2): qty 100

## 2015-03-17 MED ORDER — CETYLPYRIDINIUM CHLORIDE 0.05 % MT LIQD
7.0000 mL | Freq: Two times a day (BID) | OROMUCOSAL | Status: DC
  Administered 2015-03-18 – 2015-03-21 (×7): 7 mL via OROMUCOSAL

## 2015-03-17 MED ORDER — SODIUM CHLORIDE 0.9 % IV SOLN
200.0000 mg | Freq: Two times a day (BID) | INTRAVENOUS | Status: DC
  Administered 2015-03-17 – 2015-03-21 (×8): 200 mg via INTRAVENOUS
  Filled 2015-03-17 (×9): qty 4

## 2015-03-17 MED ORDER — CHLORHEXIDINE GLUCONATE 0.12 % MT SOLN
15.0000 mL | Freq: Two times a day (BID) | OROMUCOSAL | Status: DC
  Administered 2015-03-17 – 2015-03-22 (×9): 15 mL via OROMUCOSAL
  Filled 2015-03-17 (×11): qty 15

## 2015-03-17 MED ORDER — SODIUM CHLORIDE 0.9 % IV SOLN
1500.0000 mg | Freq: Two times a day (BID) | INTRAVENOUS | Status: DC
  Administered 2015-03-17 – 2015-03-21 (×8): 1500 mg via INTRAVENOUS
  Filled 2015-03-17 (×10): qty 15

## 2015-03-17 MED ORDER — LORAZEPAM 2 MG/ML IJ SOLN
INTRAMUSCULAR | Status: AC
  Filled 2015-03-17: qty 1

## 2015-03-17 MED ORDER — HYDRALAZINE HCL 20 MG/ML IJ SOLN
10.0000 mg | Freq: Four times a day (QID) | INTRAMUSCULAR | Status: DC | PRN
  Administered 2015-03-17 – 2015-03-20 (×5): 10 mg via INTRAVENOUS
  Filled 2015-03-17 (×6): qty 1

## 2015-03-17 MED ORDER — DEXTROSE 5 % IV SOLN
INTRAVENOUS | Status: DC
  Administered 2015-03-17 (×2): via INTRAVENOUS

## 2015-03-17 MED ORDER — SODIUM CHLORIDE 0.9 % IV SOLN
100.0000 mg | Freq: Two times a day (BID) | INTRAVENOUS | Status: DC
  Administered 2015-03-17 – 2015-03-21 (×9): 100 mg via INTRAVENOUS
  Filled 2015-03-17 (×9): qty 10

## 2015-03-17 MED ORDER — CHLORHEXIDINE GLUCONATE CLOTH 2 % EX PADS
6.0000 | MEDICATED_PAD | Freq: Every day | CUTANEOUS | Status: AC
Start: 2015-03-17 — End: 2015-03-21
  Administered 2015-03-17 – 2015-03-21 (×5): 6 via TOPICAL

## 2015-03-17 MED ORDER — LORAZEPAM 2 MG/ML IJ SOLN
1.0000 mg | Freq: Once | INTRAMUSCULAR | Status: AC
  Administered 2015-03-17: 1 mg via INTRAVENOUS

## 2015-03-17 NOTE — Progress Notes (Signed)
CRITICAL VALUE ALERT  Critical value received:  Na 172, chloride >130  Date of notification:  03/17/2015  Time of notification:  1030  Critical value read back:Yes.    Nurse who received alert:  Starla Linkarolyn J Quantae Martel, RN  MD notified (1st page):  Virginia RochesterSendil Krishnan, MD  Time of first page:  1032  MD notified (2nd page):  Time of second page:  Responding MD:  Virginia RochesterSendil Krishnan, MD  Time MD responded:  [phone]

## 2015-03-17 NOTE — Care Management Note (Signed)
Case Management Note  Patient Details  Name: Meghan PoreLaura Marie Lawson MRN: [id number] Date of Birth: 09/16/1966  Subjective/Objective:                    Action/Plan:   Expected Discharge Date:   [facility](UNKNOWN)  [id number]             Expected Discharge Plan:  Skilled Nursing Facility  In-House Referral:  Clinical Social Work  Discharge planning Services  CM Consult  Post Acute Care Choice:  NA Choice offered to:  NA  DME Arranged:    DME Agency:     HH Arranged:    HH Agency:     Status of Service:  In process, will continue to follow  Medicare Important Message Given:    Date Medicare IM Given:    Medicare IM give by:    Date Additional Medicare IM Given:    Additional Medicare Important Message give by:     If discussed at Long Length of Stay Meetings, dates discussed:    Additional Comments:  Golda AcreDavis, Rhonda Lynn, RN 03/17/2015, 11:53 AM

## 2015-03-17 NOTE — Progress Notes (Signed)
CRITICAL VALUE ALERT  Critical value received:  Na- 174 and Cl- > 130   Date of notification:  03/17/2015  Time of notification:  2:03 AM  Critical value read back:Yes.    Nurse who received alert:  Eugenio HoesL Efrem Pitstick  MD notified (1st page):  [facility]  Time of first page:  2:04 AM

## 2015-03-17 NOTE — Consult Note (Signed)
WOC wound consult note Reason for Consult: Stage II sacral pressure ulcer Moisture Associated Skin Damage to bilateral buttocks.  Wound type: Pressure and MASD Pressure Ulcer POA: Yes  Stage II Measurement:Sacrum 3 cm x 2.5 cm x 0.1 cm Bilateral buttocks are erythematous and denuded in patches.   Wound bed:100% pink and moist Drainage (amount, consistency, odor) Minimal serous Periwound:Intact Dressing procedure/placement/frequency:Cleanse sacral ulcer with NS and pat gently dry.  Apply Allevyn silicone border foam dressing. Change every 3 days and PRN soilage.  Moisture barrier cream to bilateral buttocks.  Apply Q shift and PRN incontinence.   Heels are intact and offloaded at this time.  Will not follow at this time.  Please re-consult if needed.  Maple HudsonKaren Avan Gullett RN BSN CWON Pager [phone]

## 2015-03-17 NOTE — Plan of Care (Signed)
Problem: Phase I Progression Outcomes Goal: Voiding-avoid urinary catheter unless indicated Outcome: Progressing Bladder scan for >383 mL this pm. MD Rito EhrlichKrishnan paged and order received to insert Foley. However, when I set up to insert Foley, pt had voided a large amount in bed. Will hold off on catheter insertion for now since pt voided adequate amount.   Problem: Phase II Progression Outcomes Goal: IV changed to normal saline lock Outcome: Not Progressing Pt w/ new PIV in right hand after unsuccessful PICC placement attempt. Lab unable to draw blood sample for 1700 BMP despite multiple attempts. D/w MD Rito EhrlichKrishnan.Marland Kitchen.Marland Kitchen.CCM contacted for central line placement. Plan for central line placement tonight or in am. Per MD Rito EhrlichKrishnan will recheck BMP when central line is placed/in am.

## 2015-03-17 NOTE — Care Management Note (Signed)
Case Management Note  Patient Details  Name: Meghan PoreLaura Marie Lawson MRN: [id number] Date of Birth: 03/31/1966  Subjective/Objective:                 Hypernatremic crisis   Action/Plan: snf [facility]   Expected Discharge Date:   (UNKNOWN)               Expected Discharge Plan:  Home/Self Care  In-House Referral:  Clinical Social Work  Discharge planning Services  CM Consult  Post Acute Care Choice:  NA Choice offered to:  NA  DME Arranged:    DME Agency:     HH Arranged:    HH Agency:     Status of Service:  In process, will continue to follow  Medicare Important Message Given:    Date Medicare IM Given:    Medicare IM give by:    Date Additional Medicare IM Given:    Additional Medicare Important Message give by:     If discussed at Long Length of Stay Meetings, dates discussed:    Additional Comments:  Golda AcreDavis, Joni Colegrove Lynn, RN 03/17/2015, 11:51 AM

## 2015-03-17 NOTE — Progress Notes (Signed)
NP Tom notified due to patient becoming bradycardic and on nitropaste due to no IV access on patient earlier today. IV assess has since been reestablished on patient. Nurse removed nitropaste and Tom NP to put in orders for hydralazine.

## 2015-03-17 NOTE — Progress Notes (Signed)
Date:  Mar 17, 2015 U.R. performed for needs and level of care. Will continue to follow for Case Management needs.  Camdynn Maranto, RN, BSN, CCM   [phone] 

## 2015-03-17 NOTE — Progress Notes (Signed)
Peripherally Inserted Central Catheter/Midline Placement  The IV Nurse has discussed with the patient and/or persons authorized to consent for the patient, the purpose of this procedure and the potential benefits and risks involved with this procedure.  The benefits include less needle sticks, lab draws from the catheter and patient may be discharged home with the catheter.  Risks include, but not limited to, infection, bleeding, blood clot (thrombus formation), and puncture of an artery; nerve damage and irregular heat beat.  Alternatives to this procedure were also discussed.  PICC/Midline Placement Documentation        Vevelyn PatDuncan, Danielle Lento Jean 03/17/2015, 4:21 PM

## 2015-03-17 NOTE — Progress Notes (Signed)
Attempted PICC insertion in right basilic in rt upper arm. Cannulated vein easily with good blood return but unable to advance into SVC.  PICC kept coiling.  Staff RN notified

## 2015-03-17 NOTE — Progress Notes (Addendum)
Patient ID: [name], female   DOB: 01/09/1966, 49 y.o.   MRN: [id number] Aware of request for percutaneous gastrostomy tube on patient. Case has been reviewed by Dr. Bonnielee HaffHoss. Will require CT abdomen without contrast to assess anatomy prior to gastrostomy tube placement. Call later received form Dr. Rito EhrlichKrishnan that G tube was no longer needed, therefore CT cancelled. Please contact [phone] if plans change.

## 2015-03-17 NOTE — Progress Notes (Signed)
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 She was brought in and admitted on 5/11 for decreased level of consciousness and found to be severely hypernatremic with sodium greater than 180. By later that afternoon, sodium had come down to 179.    Overnight no issues. Sodium continues to slowly come down and currently at 174. She started to wake up slightly but opening her eyes. Remaining nonverbal Assessment/Plan: Principal Problem:   Hypernatremia: Continue IV fluids, sodium slowly improving Active Problems:   HTN (hypertension): Blood pressure stable    Obesity: Patient meets criteria with BMI greater than 30   Brain aneurysm   Seizures: Changed all of her seizure medications to IV. One-on-one conversion.   Stroke   Dehydration   Code Status: Full code  Family Communication: Spoke with her sister at the bedside  Disposition Plan: Continue in stepdown until sodium levels more stable in the 150s   Consultants:  None  Procedures:  None  Antibiotics:  None   Objective: BP 110/79 mmHg  Pulse 97  Temp(Src) 98.7 F (37.1 C) (Oral)  Resp 26  Wt 104.327 kg (230 lb)  SpO2 92%  Intake/Output Summary (Last 24 hours) at 03/17/15 1409 Last data filed at 03/17/15 0800  Gross per 24 hour  Intake 1873.33 ml  Output      0 ml  Net 1873.33 ml   Filed Weights   03/16/15 1122  Weight: 104.327 kg (230 lb)    Exam:   General:  Somnolent, occasionally opens eyes  Cardiovascular: Regular rate and rhythm, S1-S2  Respiratory: Clear to auscultation bilaterally  Abdomen: Soft, nontender, nondistended, positive bowel sounds  Musculoskeletal: No clubbing or cyanosis, trace pitting edema   Data Reviewed: Basic Metabolic Panel:  Recent Labs Lab  03/16/15 1930  03/16/15 2313 03/17/15 0120 03/17/15 0344 03/17/15 0510 03/17/15 0955  NA 175*  < > 173* 174* 173* 173* 172*  K 2.9*  < > 3.0* 3.2* 3.1* 3.1* 3.6  CL >130*  < > >130* >130* >130* >130* >130*  CO2 25  < > 23 24 24 24 23   GLUCOSE 92  < > 102* 92 90 89 88  BUN 23*  < > 21* 21* 20 19 17   CREATININE 0.77  < > 0.76 0.74 0.69 0.74 0.76  CALCIUM 8.2*  < > 8.5* 8.4* 8.5* 8.7* 8.8*  MG 2.5*  --   --   --   --   --   --   < > = values in this interval not displayed. Liver Function Tests:  Recent Labs Lab 03/16/15 1134  AST 31  ALT 34  ALKPHOS 140*  BILITOT 0.6  PROT 7.1  ALBUMIN 3.6   No results for input(s): LIPASE, AMYLASE in the last 168 hours. No results for input(s): AMMONIA in the last 168 hours. CBC:  Recent Labs Lab 03/16/15 1417 03/17/15 0510  WBC 8.6 6.0  NEUTROABS 4.4  --   HGB 12.0 10.5*  HCT 39.8 36.8  MCV 99.5 99.7  PLT 112* 96*   Cardiac Enzymes:   No results for input(s): CKTOTAL, CKMB, CKMBINDEX, TROPONINI in the last 168 hours. BNP (last 3 results) No results for input(s): BNP in the last 8760 hours.  ProBNP (last 3 results) No results for input(s): PROBNP in the  last 8760 hours.  CBG: No results for input(s): GLUCAP in the last 168 hours.  Recent Results (from the past 240 hour(s))  Culture, blood (routine x 2)     Status: None (Preliminary result)   Collection Time: 03/16/15 11:34 AM  Result Value Ref Range Status   Specimen Description BLOOD LEFT HAND  Final   Special Requests BOTTLES DRAWN AEROBIC AND ANAEROBIC 5CC  Final   Culture   Final           BLOOD CULTURE RECEIVED NO GROWTH TO DATE CULTURE WILL BE HELD FOR 5 DAYS BEFORE ISSUING A FINAL NEGATIVE REPORT Performed at [facility]    Report Status PENDING  Incomplete  Culture, blood (routine x 2)     Status: None (Preliminary result)   Collection Time: 03/16/15 11:41 AM  Result Value Ref Range Status   Specimen Description BLOOD RIGHT HAND  Final   Special  Requests BOTTLES DRAWN AEROBIC AND ANAEROBIC 5CC  Final   Culture   Final           BLOOD CULTURE RECEIVED NO GROWTH TO DATE CULTURE WILL BE HELD FOR 5 DAYS BEFORE ISSUING A FINAL NEGATIVE REPORT Performed at [facility]    Report Status PENDING  Incomplete  MRSA PCR Screening     Status: Abnormal   Collection Time: 03/16/15  4:36 PM  Result Value Ref Range Status   MRSA by PCR POSITIVE (A) NEGATIVE Final    Comment:        The GeneXpert MRSA Assay (FDA approved for NASAL specimens only), is one component of a comprehensive MRSA colonization surveillance program. It is not intended to diagnose MRSA infection nor to guide or monitor treatment for MRSA infections. RESULT CALLED TO, READ BACK BY AND VERIFIED WITH: CHERYL DENNY,RN [id number] @ 1934 BY J SCOTTON      Studies: Ct Head Wo Contrast  03/16/2015   CLINICAL DATA:  One week history of increased lethargy.  EXAM: CT HEAD WITHOUT CONTRAST  TECHNIQUE: Contiguous axial images were obtained from the base of the skull through the vertex without intravenous contrast.  COMPARISON:  Head CT July 07, 2014 and July 27, 2014 brain MRI  FINDINGS: There is extensive ex vacuo phenomenon in both frontal lobes with mild residua of prior hemorrhage in these areas. The blood breakdown products in these areas are much better seen on MR than CT. The encephalomalacia extends along the medial frontal lobes to the vertex and extends posteriorly into the medial proximal parietal lobes, stable. There is lateral and third ventricular enlargement, stable. There is an aneurysm clip just inferior and anterior to the third ventricle, extending slightly toward the left, stable. There is no intracranial mass or acute hemorrhage. There is no extra-axial fluid collection or midline shift. There is small vessel disease in the centra semiovale bilaterally, more on the left than on the right with prior infarct in the left frontal lobe adjacent to the  encephalomalacia, stable. No new gray-white compartment lesions are identified. There is evidence of prior craniotomy in the left frontal lobe region. No new bone lesions are identified. Mastoid air cells bilaterally are clear.  IMPRESSION: Prior infarcts and encephalomalacia with bilateral medial frontal and anterior medial parietal lobe encephalomalacia with residua of prior hemorrhage is in these areas. No acute hemorrhage is seen on this study. No new gray-white compartment lesion. No acute infarct. No mass or mass effect. Stable atrophy. Postoperative changes noted.   Electronically Signed   By:  Bretta Bang III M.D.   On: 03/16/2015 13:32   Dg Chest Port 1 View  03/16/2015   CLINICAL DATA:  One day history of shortness of breath  EXAM: PORTABLE CHEST - 1 VIEW  COMPARISON:  February 07, 2015  FINDINGS: There is no edema or consolidation. Heart size and pulmonary vascularity are normal. No adenopathy. No bone lesions.  IMPRESSION: No edema or consolidation.   Electronically Signed   By: Bretta Bang III M.D.   On: 03/16/2015 11:10    Scheduled Meds: . Chlorhexidine Gluconate Cloth  6 each Topical Q0600  . lacosamide (VIMPAT) IV  100 mg Intravenous Q12H  . levETIRAcetam  1,500 mg Intravenous Q12H  . mupirocin ointment  1 application Nasal BID  . phenytoin (DILANTIN) IV  200 mg Intravenous BID    Continuous Infusions: . dextrose 100 mL/hr at 03/17/15 8295     Time spent: 25 minutes  Hollice Espy  [facility] Pager [phone]. If 7PM-7AM, please contact night-coverage at [email or web address], password [facility] 03/17/2015, 2:09 PM  LOS: 1 day

## 2015-03-18 ENCOUNTER — Inpatient Hospital Stay (HOSPITAL_COMMUNITY): Payer: 59

## 2015-03-18 DIAGNOSIS — Z87898 Personal history of other specified conditions: Secondary | ICD-10-CM

## 2015-03-18 DIAGNOSIS — E870 Hyperosmolality and hypernatremia: Principal | ICD-10-CM

## 2015-03-18 LAB — BASIC METABOLIC PANEL
Anion gap: 5 (ref 5–15)
BUN: 13 mg/dL (ref 6–20)
BUN: 12 mg/dL (ref 6–20)
BUN: 10 mg/dL (ref 6–20)
BUN: 9 mg/dL (ref 6–20)
CALCIUM: 8.5 mg/dL — AB (ref 8.9–10.3)
CO2: 21 mmol/L — ABNORMAL LOW (ref 22–32)
CO2: 22 mmol/L (ref 22–32)
CO2: 23 mmol/L (ref 22–32)
CO2: 23 mmol/L (ref 22–32)
CREATININE: 0.53 mg/dL (ref 0.44–1.00)
CREATININE: 0.53 mg/dL (ref 0.44–1.00)
Calcium: 8.7 mg/dL — ABNORMAL LOW (ref 8.9–10.3)
Calcium: 9 mg/dL (ref 8.9–10.3)
Calcium: 8.5 mg/dL — ABNORMAL LOW (ref 8.9–10.3)
Chloride: >130 mmol/L (ref 101–111)
Chloride: 129 mmol/L — ABNORMAL HIGH (ref 101–111)
Creatinine, Ser: 0.6 mg/dL (ref 0.44–1.00)
Creatinine, Ser: 0.48 mg/dL (ref 0.44–1.00)
GFR calc Af Amer: >60 mL/min (ref >60)
GFR calc Af Amer: >60 mL/min (ref >60)
GFR calc Af Amer: >60 mL/min (ref >60)
GFR calc non Af Amer: >60 mL/min (ref >60)
GFR calc non Af Amer: >60 mL/min (ref >60)
GFR calc non Af Amer: >60 mL/min (ref >60)
Glucose, Bld: 104 mg/dL — ABNORMAL HIGH (ref 65–99)
Glucose, Bld: 117 mg/dL — ABNORMAL HIGH (ref 65–99)
Glucose, Bld: 122 mg/dL — ABNORMAL HIGH (ref 65–99)
Glucose, Bld: 120 mg/dL — ABNORMAL HIGH (ref 65–99)
POTASSIUM: 3.3 mmol/L — AB (ref 3.5–5.1)
POTASSIUM: 2.8 mmol/L — AB (ref 3.5–5.1)
Potassium: 3 mmol/L — ABNORMAL LOW (ref 3.5–5.1)
Potassium: 2.9 mmol/L — ABNORMAL LOW (ref 3.5–5.1)
SODIUM: 165 mmol/L — AB (ref 135–145)
SODIUM: 163 mmol/L — AB (ref 135–145)
Sodium: 164 mmol/L (ref 135–145)
Sodium: 157 mmol/L — ABNORMAL HIGH (ref 135–145)

## 2015-03-18 LAB — BRAIN NATRIURETIC PEPTIDE: B Natriuretic Peptide: 69.7 pg/mL (ref 0.0–100.0)

## 2015-03-18 MED ORDER — CLONIDINE HCL 0.2 MG/24HR TD PTWK
0.2000 mg | MEDICATED_PATCH | TRANSDERMAL | Status: DC
  Administered 2015-03-19: 0.2 mg via TRANSDERMAL
  Filled 2015-03-18: qty 1

## 2015-03-18 MED ORDER — DEXTROSE 5 % IV SOLN
INTRAVENOUS | Status: DC
  Administered 2015-03-18 – 2015-03-20 (×5): via INTRAVENOUS
  Filled 2015-03-18 (×10): qty 1000

## 2015-03-18 MED ORDER — LORAZEPAM 2 MG/ML IJ SOLN
0.5000 mg | Freq: Four times a day (QID) | INTRAMUSCULAR | Status: DC | PRN
  Administered 2015-03-19 – 2015-03-21 (×3): 0.5 mg via INTRAVENOUS
  Filled 2015-03-18 (×3): qty 1

## 2015-03-18 MED ORDER — LIP MEDEX EX OINT
TOPICAL_OINTMENT | CUTANEOUS | Status: AC
  Administered 2015-03-18: 1
  Filled 2015-03-18: qty 7

## 2015-03-18 NOTE — Progress Notes (Signed)
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 She was brought in and admitted on 5/11 for decreased level of consciousness and found to be severely hypernatremic with sodium greater than 180. By later that afternoon, sodium had come down to 179.    Patient's sodium continues to trend down slowly. She spontaneously will open her eyes, but remains nonverbal. Assessment/Plan: Principal Problem:   Hypernatremia: Continue IV fluids, sodium slowly improving. We lost IV access and central line placed after attempted PICC unable to be done. Monitor oxygen saturations considering we are aggressively rehydrating Active Problems:   HTN (hypertension): Blood pressure stable    Obesity: Patient meets criteria with BMI greater than 30   Brain aneurysm   Seizures: Changed all of her seizure medications to IV. One-on-one conversion.   Stroke   Dehydration   Code Status: Full code  Family Communication: Spoke with her sister at the bedside yesterday  Disposition Plan: Continue in stepdown until sodium levels more stable in the 150s   Consultants:  CCM for line placement  Procedures:  Line placement done 5/13  Antibiotics:  None   Objective: BP 133/85 mmHg  Pulse 74  Temp(Src) 98.4 F (36.9 C) (Oral)  Resp 16  Wt 91.7 kg (202 lb 2.6 oz)  SpO2 89%  Intake/Output Summary (Last 24 hours) at 03/18/15 1233 Last data filed at 03/18/15 0740  Gross per 24 hour  Intake 2477.33 ml  Output    450 ml  Net 2027.33 ml   Filed Weights   03/16/15 1122 03/18/15 0500  Weight: 104.327 kg (230 lb) 91.7 kg (202 lb 2.6 oz)    Exam: Unchanged from previous  General:  Somnolent, occasionally opens eyes  Cardiovascular: Regular rate and rhythm, S1-S2  Respiratory: Clear  to auscultation bilaterally  Abdomen: Soft, nontender, nondistended, positive bowel sounds  Musculoskeletal: No clubbing or cyanosis, trace pitting edema   Data Reviewed: Basic Metabolic Panel:  Recent Labs Lab 03/16/15 1930  03/17/15 0344 03/17/15 0510 03/17/15 0955 03/17/15 2305 03/18/15 0554  NA 175*  < > 173* 173* 172* 165* 163*  K 2.9*  < > 3.1* 3.1* 3.6 3.0* 3.3*  CL >130*  < > >130* >130* >130* >130* >130*  CO2 25  < > 24 24 23  21* 22  GLUCOSE 92  < > 90 89 88 104* 117*  BUN 23*  < > 20 19 17 13 12   CREATININE 0.77  < > 0.69 0.74 0.76 0.53 0.60  CALCIUM 8.2*  < > 8.5* 8.7* 8.8* 8.7* 9.0  MG 2.5*  --   --   --   --   --   --   < > = values in this interval not displayed. Liver Function Tests:  Recent Labs Lab 03/16/15 1134  AST 31  ALT 34  ALKPHOS 140*  BILITOT 0.6  PROT 7.1  ALBUMIN 3.6   No results for input(s): LIPASE, AMYLASE in the last 168 hours. No results for input(s): AMMONIA in the last 168 hours. CBC:  Recent Labs Lab 03/16/15 1417 03/17/15 0510  WBC 8.6 6.0  NEUTROABS 4.4  --   HGB 12.0 10.5*  HCT 39.8 36.8  MCV 99.5 99.7  PLT 112* 96*   Cardiac Enzymes:   No results for input(s): CKTOTAL, CKMB, CKMBINDEX, TROPONINI  in the last 168 hours. BNP (last 3 results) No results for input(s): BNP in the last 8760 hours.  ProBNP (last 3 results) No results for input(s): PROBNP in the last 8760 hours.  CBG: No results for input(s): GLUCAP in the last 168 hours.  Recent Results (from the past 240 hour(s))  Urine culture     Status: None   Collection Time: 03/16/15 10:48 AM  Result Value Ref Range Status   Specimen Description URINE, CATHETERIZED  Final   Special Requests NONE  Final   Colony Count NO GROWTH Performed at [facility]   Final   Culture NO GROWTH Performed at [facility]   Final   Report Status 03/17/2015 FINAL  Final  Culture, blood (routine x 2)     Status: None (Preliminary result)   Collection  Time: 03/16/15 11:34 AM  Result Value Ref Range Status   Specimen Description BLOOD LEFT HAND  Final   Special Requests BOTTLES DRAWN AEROBIC AND ANAEROBIC 5CC  Final   Culture   Final           BLOOD CULTURE RECEIVED NO GROWTH TO DATE CULTURE WILL BE HELD FOR 5 DAYS BEFORE ISSUING A FINAL NEGATIVE REPORT Performed at [facility]    Report Status PENDING  Incomplete  Culture, blood (routine x 2)     Status: None (Preliminary result)   Collection Time: 03/16/15 11:41 AM  Result Value Ref Range Status   Specimen Description BLOOD RIGHT HAND  Final   Special Requests BOTTLES DRAWN AEROBIC AND ANAEROBIC 5CC  Final   Culture   Final           BLOOD CULTURE RECEIVED NO GROWTH TO DATE CULTURE WILL BE HELD FOR 5 DAYS BEFORE ISSUING A FINAL NEGATIVE REPORT Performed at [facility]    Report Status PENDING  Incomplete  MRSA PCR Screening     Status: Abnormal   Collection Time: 03/16/15  4:36 PM  Result Value Ref Range Status   MRSA by PCR POSITIVE (A) NEGATIVE Final    Comment:        The GeneXpert MRSA Assay (FDA approved for NASAL specimens only), is one component of a comprehensive MRSA colonization surveillance program. It is not intended to diagnose MRSA infection nor to guide or monitor treatment for MRSA infections. RESULT CALLED TO, READ BACK BY AND VERIFIED WITH: Evans LanceHERYL DENNY,RN [id number] @ 1934 BY J SCOTTON      Studies: No results found.  Scheduled Meds: . antiseptic oral rinse  7 mL Mouth Rinse q12n4p  . chlorhexidine  15 mL Mouth Rinse BID  . Chlorhexidine Gluconate Cloth  6 each Topical Q0600  . lacosamide (VIMPAT) IV  100 mg Intravenous Q12H  . levETIRAcetam  1,500 mg Intravenous Q12H  . mupirocin ointment  1 application Nasal BID  . phenytoin (DILANTIN) IV  200 mg Intravenous BID    Continuous Infusions: . dextrose 100 mL/hr at 03/17/15 2007     Time spent: 15 minutes  Hollice EspyKRISHNAN,Sadiyah Kangas K  [facility] Pager [phone]. If 7PM-7AM,  please contact night-coverage at [email or web address], password [email or web address] 03/18/2015, 12:33 PM  LOS: 2 days

## 2015-03-18 NOTE — Procedures (Signed)
Central Venous Catheter Insertion Procedure Note Meghan PoreLaura Marie Lawson [id number] 09/23/1966  Procedure: Insertion of Central Venous Catheter Indications: Drug and/or fluid administration  Procedure Details Consent: Risks of procedure as well as the alternatives and risks of each were explained to the (patient/caregiver).  Consent for procedure obtained. Time Out: Verified patient identification, verified procedure, site/side was marked, verified correct patient position, special equipment/implants available, medications/allergies/relevent history reviewed, required imaging and test results available.  Performed Left IJ identified via real time US Maximum sterile technique was used including antiseptics, cap, gloves, gown, hand hygiene, mask and sheet. Skin prep: Chlorhexidine; local anesthetic administered A antimicrobial bonded/coated triple lumen catheter was placed in the left internal jugular vein using the Seldinger technique.  Evaluation Blood flow good Complications: No apparent complications Patient did tolerate procedure well. Chest X-ray ordered to verify placement.  CXR: pending.  [name],PETE 03/18/2015, 10:39 AM

## 2015-03-18 NOTE — Clinical Social Work Placement (Signed)
   CLINICAL SOCIAL WORK PLACEMENT  NOTE  Date:  03/18/2015  Patient Details  Name: [name] MRN: [id number] Date of Birth: 07/01/1966  Clinical Social Work is seeking post-discharge placement for this patient at the Skilled  Nursing Facility level of care (*CSW will initial, date and re-position this form in  chart as items are completed):  Yes   Patient/family provided with [place] Clinical Social Work Department's list of facilities offering this level of care within the geographic area requested by the patient (or if unable, by the patient's family).      Patient/family informed of their freedom to choose among providers that offer the needed level of care, that participate in Medicare, Medicaid or managed care program needed by the patient, have an available bed and are willing to accept the patient.  Yes   Patient/family informed of [facility] [place] [facility]'s ownership interest in [facility] and [facility], as well as of the fact that they are under no obligation to receive care at these facilities.  PASRR submitted to EDS on       PASRR number received on       Existing PASRR number confirmed on 03/18/15     FL2 transmitted to all facilities in geographic area requested by pt/family on 03/18/15     FL2 transmitted to all facilities within larger geographic area on       Patient informed that his/her managed care company has contracts with or will negotiate with certain facilities, including the following:            Patient/family informed of bed offers received.  Patient chooses bed at       Physician recommends and patient chooses bed at      Patient to be transferred to   on  .  Patient to be transferred to facility by       Patient family notified on   of transfer.  Name of family member notified:        PHYSICIAN Please sign FL2     Additional Comment:   [facility]. Pt family interested in exploring other  options. Pt family aware that other options may be limited as pt long term Medicaid. Pt family aware that if no other options available then pt will have to [facility].  _______________________________________________ Orson EvaKIDD, SUZANNA A, LCSW 03/18/2015, 4:31 PM

## 2015-03-18 NOTE — Clinical Social Work Note (Signed)
Clinical Social Work Assessment  Patient Details  Name: [name] MRN: [id number] Date of Birth: August 30, 1966  Date of referral:  03/18/15               Reason for consult:  Discharge Planning                Permission sought to share information with:  Family Supports, Customer service manager Permission granted to share information::  Yes, Verbal Permission Granted  Name::     Torrie Musician  Agency::  [facility]  Relationship::  son  Contact Information:  [phone]  Housing/Transportation Living arrangements for the past 2 months:  [place] of Information:  Adult Children Patient Interpreter Needed:  None Criminal Activity/Legal Involvement Pertinent to Current Situation/Hospitalization:  No - Comment as needed Significant Relationships:  Adult Children, Siblings Lives with:  Facility Resident Do you feel safe going back to the place where you live?  No (pt long term resident at [facility] family interested in exploring other options) Need for family participation in patient care:  Yes (Comment) (pt nonverbal )  Care giving concerns:  Pt admitted from [facility] where pt is a long term resident; assessing pt family wishes regarding discharge plan.    Social Worker assessment / plan:  CSW received referral that pt admitted from [facility] where pt is a long term resident.   CSW met with pt son and pt sister-in-law at bedside. CSW introduced self and explained role. Pt son confirmed that pt is a long term resident at [facility]. Pt son shared that pt has been a resident at [facility] for one year. Pt son reports that pt was at [facility] prior to [facility] and [facility] prior to [facility]. Pt son shared that pt family is hopeful to explore other options for pt for long term care.   CSW discussed that CSW can assist  pt family in exploring options and discussed that Medicaid only facilities can be difficult to obtain. Pt son expressed understanding and stated that if another option cannot be found then pt family would be agreeable to return to [facility], but pt family would like to try to explore options. Pt son agreeable to [facility] and [place] SNF search excluding [facility] as facility is not aware pt family exploring other options and pt may have to return if no other options available, [facility], [facility], [place] per pt family request as pt family are aware that they would not want to go to those facilities.   CSW completed FL2 and initiated SNF search to [facility] and [facility] facilities discussed with pt family.   CSW to follow up with pt and pt family re: SNF bed offers.   CSW to continue to follow to provide support and assist with pt discharge planning needs.   Employment status:  Disabled (Comment on whether or not currently receiving Disability) Insurance information:  Programmer, applications, Medicaid [place] PT Recommendations:  Not assessed at this time Information / Referral to community resources:  [place]  Patient/Family's Response to care:  Pt oriented to person only and non verbal. Pt family supportive and actively involved in pt care. Pt family hopeful that another option for long term care facility may be available, but recognize that pt would have to return to [facility] if no other options  available.   Patient/Family's Understanding of and Emotional Response to Diagnosis, Current Treatment, and Prognosis:  Pt son knowledgeable about pt diagnosis and treatment. Pt family hopeful for continued improvement of pt in the hospital and eager to learn bed offers to see if there are any other options for long term facilities.   Emotional Assessment Appearance:  Appears  stated age Attitude/Demeanor/Rapport:  Unable to Assess (pt nonverbal) Affect (typically observed):  Unable to Assess Orientation:  Oriented to Self Alcohol / Substance use:  Not Applicable Psych involvement (Current and /or in the community):  No (Comment)  Discharge Needs  Concerns to be addressed:  Discharge Planning Concerns Readmission within the last 30 days:  No Current discharge risk:  Dependent with Mobility Barriers to Discharge:  Continued Medical Work up   Ladell Pier, [place] 03/18/2015, 5:08 PM  [phone]

## 2015-03-18 NOTE — Progress Notes (Signed)
Initial Nutrition Assessment  DOCUMENTATION CODES:  Obesity unspecified  INTERVENTION: - Recommend SLP evaluation prior to diet advancement.  - Once diet advanced, recommend Magic cup TID with meals, each supplement provides 290 kcal and 9 grams of protein. - Once diet advanced, recommend Prostat BID, each supplement provides 100 kcal and 15 g protein.  - RD will continue to monitor  NUTRITION DIAGNOSIS:  Inadequate oral intake related to inability to eat as evidenced by NPO status.  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 She was brought in and admitted on 5/11 for decreased level of consciousness and found to be severely hypernatremic with sodium greater than 180.  - Spoke with pt's family member (mother-in-law?) who reported that pt was on a pureed diet with thickened liquids at SNF prior to admission. She reported that pt had good po intake and would eat anything she was given. She was concerned that pt was not getting any water, only thickened tea and juices. Reports pt's usual body weight as 200 lbs or a little more.  - Per chart history, pt's weight has decreased 28 lbs in the past year (not significant for time frame.) - Recommend SLP evaluation prior to pt's diet being upgraded.  - Pt has a history of g-tube up which was removed last month. No plans for replacement at this time.  - Labs and medications reviewed  Na 164  K 2.9  Cl >130  Ca 8.5 - RD will continue to monitor  Height:  Ht Readings from Last 1 Encounters:  04/06/14 5\' 6"  (1.676 m)    Weight:  Wt Readings from Last 1 Encounters:  03/18/15 202 lb 2.6 oz (91.7 kg)    Ideal Body Weight:  59.1 kg  Wt Readings  from Last 10 Encounters:  03/18/15 202 lb 2.6 oz (91.7 kg)  04/07/14 230 lb 6.1 oz (104.5 kg)  09/18/13 273 lb (123.832 kg)  09/07/13 272 lb 12 oz (123.719 kg)  08/17/13 268 lb 12 oz (121.904 kg)  06/02/13 270 lb (122.471 kg)  03/04/13 276 lb (125.193 kg)  02/10/13 279 lb 8 oz (126.78 kg)  12/09/12 280 lb (127.007 kg)  09/29/12 267 lb (121.11 kg)    BMI:  Body mass index is 32.65 kg/(m^2).  Estimated Nutritional Needs:  Kcal:  1800-2000  Protein:  120-130 g  Fluid:  2.0 L/day  Skin:  Wound (see comment) (Stage II wound on sacrum, Stage II wound on buttocks)  Diet Order:  Diet NPO time specified Except for: Sips with Meds  EDUCATION NEEDS:  No education needs identified at this time   Intake/Output Summary (Last 24 hours) at 03/18/15 1355 Last data filed at 03/18/15 0740  Gross per 24 hour  Intake 2477.33 ml  Output    450 ml  Net 2027.33 ml    Last BM:  5/13  Emmaline KluverHaley Annmargaret Decaprio MS, RD, LDN [phone]

## 2015-03-19 LAB — BASIC METABOLIC PANEL
Anion gap: 8 (ref 5–15)
Anion gap: 10 (ref 5–15)
Anion gap: 8 (ref 5–15)
Anion gap: 6 (ref 5–15)
BUN: 5 mg/dL — AB (ref 6–20)
BUN: 6 mg/dL (ref 6–20)
BUN: 7 mg/dL (ref 6–20)
BUN: 8 mg/dL (ref 6–20)
CALCIUM: 8.4 mg/dL — AB (ref 8.9–10.3)
CALCIUM: 8.6 mg/dL — AB (ref 8.9–10.3)
CHLORIDE: 124 mmol/L — AB (ref 101–111)
CHLORIDE: 128 mmol/L — AB (ref 101–111)
CO2: 20 mmol/L — AB (ref 22–32)
CO2: 20 mmol/L — AB (ref 22–32)
CO2: 20 mmol/L — ABNORMAL LOW (ref 22–32)
CO2: 22 mmol/L (ref 22–32)
Calcium: 8.7 mg/dL — ABNORMAL LOW (ref 8.9–10.3)
Calcium: 8.3 mg/dL — ABNORMAL LOW (ref 8.9–10.3)
Chloride: 125 mmol/L — ABNORMAL HIGH (ref 101–111)
Chloride: 123 mmol/L — ABNORMAL HIGH (ref 101–111)
Creatinine, Ser: 0.45 mg/dL (ref 0.44–1.00)
Creatinine, Ser: 0.51 mg/dL (ref 0.44–1.00)
Creatinine, Ser: 0.48 mg/dL (ref 0.44–1.00)
Creatinine, Ser: 0.44 mg/dL (ref 0.44–1.00)
GFR calc Af Amer: >60 mL/min (ref >60)
GFR calc Af Amer: >60 mL/min (ref >60)
GFR calc Af Amer: >60 mL/min (ref >60)
GFR calc non Af Amer: >60 mL/min (ref >60)
GFR calc non Af Amer: >60 mL/min (ref >60)
GFR calc non Af Amer: >60 mL/min (ref >60)
GFR calc non Af Amer: >60 mL/min (ref >60)
GLUCOSE: 144 mg/dL — AB (ref 65–99)
Glucose, Bld: 119 mg/dL — ABNORMAL HIGH (ref 65–99)
Glucose, Bld: 129 mg/dL — ABNORMAL HIGH (ref 65–99)
Glucose, Bld: 122 mg/dL — ABNORMAL HIGH (ref 65–99)
Potassium: 2.6 mmol/L — CL (ref 3.5–5.1)
Potassium: 2.9 mmol/L — ABNORMAL LOW (ref 3.5–5.1)
Potassium: 3.4 mmol/L — ABNORMAL LOW (ref 3.5–5.1)
Potassium: 3.6 mmol/L (ref 3.5–5.1)
SODIUM: 156 mmol/L — AB (ref 135–145)
Sodium: 156 mmol/L — ABNORMAL HIGH (ref 135–145)
Sodium: 153 mmol/L — ABNORMAL HIGH (ref 135–145)
Sodium: 149 mmol/L — ABNORMAL HIGH (ref 135–145)

## 2015-03-19 MED ORDER — METOPROLOL TARTRATE 1 MG/ML IV SOLN
5.0000 mg | Freq: Four times a day (QID) | INTRAVENOUS | Status: DC
  Administered 2015-03-19 – 2015-03-21 (×8): 5 mg via INTRAVENOUS
  Filled 2015-03-19 (×12): qty 5

## 2015-03-19 MED ORDER — POTASSIUM CHLORIDE 10 MEQ/100ML IV SOLN: 10.0000 meq | INTRAVENOUS | Status: DC

## 2015-03-19 MED ORDER — POTASSIUM CHLORIDE 2 MEQ/ML IV SOLN
Freq: Once | INTRAVENOUS | Status: AC
  Administered 2015-03-19: 03:00:00 via INTRAVENOUS
  Filled 2015-03-19: qty 400

## 2015-03-19 MED ORDER — POTASSIUM CHLORIDE 10 MEQ/100ML IV SOLN
10.0000 meq | INTRAVENOUS | Status: AC
  Administered 2015-03-19 (×4): 10 meq via INTRAVENOUS
  Filled 2015-03-19 (×6): qty 100

## 2015-03-19 NOTE — Progress Notes (Signed)
Pt arrived in room 1516 at 1215.  Pt non verbal but does not appear to be in any resp distress.  Vss.  Erick Blinksuchman, Garett Tetzloff D, RN

## 2015-03-19 NOTE — Progress Notes (Signed)
CRITICAL VALUE ALERT  Critical value received:  K 2.6  Date of notification:  03/19/2015   Time of notification:  0130  Critical value read back:Yes.    Nurse who received alert:  Estill DoomsSimon, Ilyaas Musto Mahario   MD notified (1st page):  Toya SmothersKaren Black NP  Time of first page:  2:03 AM   MD notified (2nd page):  Time of second page:  Responding MD:  Toya SmothersKaren Black NP  Time MD responded:  2:06 AM

## 2015-03-19 NOTE — Progress Notes (Signed)
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 She was brought in and admitted on 5/11 for decreased level of consciousness and found to be severely hypernatremic with sodium greater than 180. By later that afternoon, sodium had come down to 179.    Patient's sodium continues to trend down slowly. She spontaneously will open her eyes, but remains nonverbal. Assessment/Plan: Principal Problem:   Hypernatremia: Continue IV fluids, sodium slowly improving. We lost IV access and central line placed after attempted PICC unable to be done. Monitor oxygen saturations considering we are aggressively rehydrating.  Sodium down to 153 Active Problems:   HTN (hypertension): Blood pressure stable    Obesity: Patient meets criteria with BMI greater than 30   Brain aneurysm   Seizures: Changed all of her seizure medications to IV. One-on-one conversion.   Stroke   Dehydration   Code Status: Full code  Family Communication: Left message with sister  Disposition Plan: Given continued improvement in sodium, transfer to floor. Anticipate return back to facility in 48-72 hours, once sodium normalized and she is able to safely start taking by mouth   Consultants:  CCM for line placement  Procedures:  Line placement done 5/13  Antibiotics:  None   Objective: BP 153/90 mmHg  Pulse 82  Temp(Src) 98.9 F (37.2 C) (Oral)  Resp 18  Wt 95.3 kg (210 lb 1.6 oz)  SpO2 99%  Intake/Output Summary (Last 24 hours) at 03/19/15 1258 Last data filed at 03/19/15 1100  Gross per 24 hour  Intake 2297.5 ml  Output   2000 ml  Net  297.5 ml   Filed Weights   03/16/15 1122 03/18/15 0500 03/19/15 1232  Weight: 104.327 kg (230 lb) 91.7 kg (202 lb 2.6 oz) 95.3 kg (210 lb 1.6 oz)     Exam:   General:  More awake, nonverbal, opens eyes  Cardiovascular: Regular rate and rhythm, S1-S2  Respiratory: Clear to auscultation bilaterally  Abdomen: Soft, nontender, nondistended, positive bowel sounds  Musculoskeletal: No clubbing or cyanosis, trace pitting edema   Data Reviewed: Basic Metabolic Panel:  Recent Labs Lab 03/16/15 1930  03/18/15 1240 03/18/15 1700 03/19/15 0030 03/19/15 0600 03/19/15 1112  NA 175*  < > 164* 157* 156* 156* 153*  K 2.9*  < > 2.9* 2.8* 2.6* 2.9* 3.4*  CL >130*  < > >130* 129* 128* 124* 125*  CO2 25  < > 23 23 20* 22 20*  GLUCOSE 92  < > 122* 120* 119* 129* 144*  BUN 23*  < > 10 9 8 7 6   CREATININE 0.77  < > 0.53 0.48 0.45 0.51 0.48  CALCIUM 8.2*  < > 8.5* 8.5* 8.6* 8.7* 8.3*  MG 2.5*  --   --   --   --   --   --   < > = values in this interval not displayed. Liver Function Tests:  Recent Labs Lab 03/16/15 1134  AST 31  ALT 34  ALKPHOS 140*  BILITOT 0.6  PROT 7.1  ALBUMIN 3.6   No results for input(s): LIPASE, AMYLASE in the last 168 hours. No results for input(s): AMMONIA in the last 168 hours. CBC:  Recent Labs Lab 03/16/15 1417 03/17/15 0510  WBC 8.6 6.0  NEUTROABS 4.4  --   HGB 12.0 10.5*  HCT 39.8 36.8  MCV 99.5 99.7  PLT 112* 96*   Cardiac Enzymes:   No results for input(s): CKTOTAL, CKMB, CKMBINDEX, TROPONINI in the last 168 hours. BNP (last 3 results)  Recent Labs  03/18/15 1240  BNP 69.7    ProBNP (last 3 results) No results for input(s): PROBNP in the last 8760 hours.  CBG: No results for input(s): GLUCAP in the last 168 hours.  Recent Results (from the past 240 hour(s))  Urine culture     Status: None   Collection Time: 03/16/15 10:48 AM  Result Value Ref Range Status   Specimen Description URINE, CATHETERIZED  Final   Special Requests NONE  Final   Colony Count NO GROWTH [facility]formed at [facility]   Final   Culture NO GROWTH Performed at [facility]   Final    Report Status 03/17/2015 FINAL  Final  Culture, blood (routine x 2)     Status: None (Preliminary result)   Collection Time: 03/16/15 11:34 AM  Result Value Ref Range Status   Specimen Description BLOOD LEFT HAND  Final   Special Requests BOTTLES DRAWN AEROBIC AND ANAEROBIC 5CC  Final   Culture   Final           BLOOD CULTURE RECEIVED NO GROWTH TO DATE CULTURE WILL BE HELD FOR 5 DAYS BEFORE ISSUING A FINAL NEGATIVE REPORT Performed at [facility]    Report Status PENDING  Incomplete  Culture, blood (routine x 2)     Status: None (Preliminary result)   Collection Time: 03/16/15 11:41 AM  Result Value Ref Range Status   Specimen Description BLOOD RIGHT HAND  Final   Special Requests BOTTLES DRAWN AEROBIC AND ANAEROBIC 5CC  Final   Culture   Final           BLOOD CULTURE RECEIVED NO GROWTH TO DATE CULTURE WILL BE HELD FOR 5 DAYS BEFORE ISSUING A FINAL NEGATIVE REPORT Performed at [facility]    Report Status PENDING  Incomplete  MRSA PCR Screening     Status: Abnormal   Collection Time: 03/16/15  4:36 PM  Result Value Ref Range Status   MRSA by PCR POSITIVE (A) NEGATIVE Final    Comment:        The GeneXpert MRSA Assay (FDA approved for NASAL specimens only), is one component of a comprehensive MRSA colonization surveillance program. It is not intended to diagnose MRSA infection nor to guide or monitor treatment for MRSA infections. RESULT CALLED TO, READ BACK BY AND VERIFIED WITH: Evans LanceCHERYL DENNY,RN [id number] @ 1934 BY J SCOTTON      Studies: Dg Chest Port 1 View  03/18/2015   CLINICAL DATA:  Central line placement on the left  EXAM: PORTABLE CHEST - 1 VIEW  COMPARISON:  03/16/2015  FINDINGS: Artifact overlies the chest. Left internal jugular central line has its tip at the SVC RA junction. Heart size is at the upper limits of normal. The aorta is unfolded. The patient has taken a poor inspiration the lungs are likely clear. No pneumothorax.  IMPRESSION: No  pneumothorax post central line placement. Catheter tip at the SVC RA junction.   Electronically Signed   By: Paulina FusiMark  Shogry M.D.   On: 03/18/2015 11:30    Scheduled Meds: . antiseptic oral rinse  7 mL Mouth Rinse q12n4p  . chlorhexidine  15 mL Mouth Rinse BID  . Chlorhexidine Gluconate Cloth  6 each Topical Q0600  . lacosamide (VIMPAT) IV  100 mg  Intravenous Q12H  . levETIRAcetam  1,500 mg Intravenous Q12H  . metoprolol  5 mg Intravenous 4 times per day  . mupirocin ointment  1 application Nasal BID  . phenytoin (DILANTIN) IV  200 mg Intravenous BID  . potassium chloride  10 mEq Intravenous Q1 Hr x 4    Continuous Infusions: . dextrose 5 % with kcl 150 mL/hr at 03/19/15 1000     Time spent: 15 minutes  Hollice Espy  [facility] Pager [phone]. If 7PM-7AM, please contact night-coverage at [email or web address], password [facility] 03/19/2015, 12:58 PM  LOS: 3 days

## 2015-03-20 DIAGNOSIS — A047 Enterocolitis due to Clostridium difficile: Secondary | ICD-10-CM

## 2015-03-20 LAB — BASIC METABOLIC PANEL
Anion gap: 7 (ref 5–15)
Anion gap: 7 (ref 5–15)
BUN: <5 mg/dL — ABNORMAL LOW (ref 6–20)
CHLORIDE: 119 mmol/L — AB (ref 101–111)
CO2: 20 mmol/L — ABNORMAL LOW (ref 22–32)
CO2: 21 mmol/L — ABNORMAL LOW (ref 22–32)
CREATININE: 0.45 mg/dL (ref 0.44–1.00)
CREATININE: 0.5 mg/dL (ref 0.44–1.00)
Calcium: 8.4 mg/dL — ABNORMAL LOW (ref 8.9–10.3)
Calcium: 8.3 mg/dL — ABNORMAL LOW (ref 8.9–10.3)
Chloride: 116 mmol/L — ABNORMAL HIGH (ref 101–111)
GFR calc Af Amer: >60 mL/min (ref >60)
GFR calc Af Amer: >60 mL/min (ref >60)
GFR calc non Af Amer: >60 mL/min (ref >60)
GFR calc non Af Amer: >60 mL/min (ref >60)
GLUCOSE: 111 mg/dL — AB (ref 65–99)
Glucose, Bld: 116 mg/dL — ABNORMAL HIGH (ref 65–99)
POTASSIUM: 3.2 mmol/L — AB (ref 3.5–5.1)
POTASSIUM: 3.4 mmol/L — AB (ref 3.5–5.1)
SODIUM: 146 mmol/L — AB (ref 135–145)
Sodium: 144 mmol/L (ref 135–145)

## 2015-03-20 LAB — CBC
HCT: 31.1 % — ABNORMAL LOW (ref 36.0–46.0)
Hemoglobin: 10.2 g/dL — ABNORMAL LOW (ref 12.0–15.0)
MCH: 30 pg (ref 26.0–34.0)
MCHC: 32.8 g/dL (ref 30.0–36.0)
MCV: 91.5 fL (ref 78.0–100.0)
Platelets: 89 10*3/uL — ABNORMAL LOW (ref 150–400)
RBC: 3.4 MIL/uL — AB (ref 3.87–5.11)
RDW: 13.7 % (ref 11.5–15.5)
WBC: 5.6 10*3/uL (ref 4.0–10.5)

## 2015-03-20 LAB — MAGNESIUM: Magnesium: 1.8 mg/dL (ref 1.7–2.4)

## 2015-03-20 LAB — CLOSTRIDIUM DIFFICILE BY PCR: Toxigenic C. Difficile by PCR: POSITIVE — AB

## 2015-03-20 MED ORDER — METRONIDAZOLE 500 MG PO TABS
500.0000 mg | ORAL_TABLET | Freq: Three times a day (TID) | ORAL | Status: DC
  Administered 2015-03-20 – 2015-03-22 (×7): 500 mg via ORAL
  Filled 2015-03-20 (×9): qty 1

## 2015-03-20 MED ORDER — ACETAMINOPHEN 325 MG PO TABS
650.0000 mg | ORAL_TABLET | Freq: Four times a day (QID) | ORAL | Status: DC | PRN
  Administered 2015-03-20: 650 mg via ORAL
  Filled 2015-03-20: qty 2

## 2015-03-20 MED ORDER — SODIUM CHLORIDE 0.9 % IJ SOLN
10.0000 mL | INTRAMUSCULAR | Status: DC | PRN
  Administered 2015-03-20 – 2015-03-22 (×4): 10 mL
  Filled 2015-03-20 (×4): qty 40

## 2015-03-20 MED ORDER — FUROSEMIDE 10 MG/ML IJ SOLN
20.0000 mg | Freq: Once | INTRAMUSCULAR | Status: AC
  Administered 2015-03-20: 20 mg via INTRAVENOUS
  Filled 2015-03-20: qty 2

## 2015-03-20 NOTE — Progress Notes (Signed)
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 She was brought in and admitted on 5/11 for decreased level of consciousness and found to be severely hypernatremic with sodium greater than 180. By later that afternoon, sodium had come down to 179.    Patient's sodium continues to trend down slowly. She spontaneously will open her eyes, but remains nonverbal.  On 5/15, nursing noted significant amount of diarrhea. Stool sent and positive for C. Difficile.   Assessment/Plan: Principal Problem:   Hypernatremia: Continue IV fluids, sodium slowly improving. We lost IV access and central line placed after attempted PICC unable to be done. Monitor oxygen saturations considering we are aggressively rehydrating.  Sodium reaching normal range Active Problems:   HTN (hypertension): Blood pressure stable    Obesity: Patient meets criteria with BMI greater than 30   Brain aneurysm   Seizures: Changed all of her seizure medications to IV. One-on-one conversion.   Stroke C. difficile colitis: Started on by mouth Flagyl, blood pressure, white blood cell count normal.    Code Status: Full code  Family Communication: Left message with sister  Disposition Plan: Treat C. difficile. Out of bed to chair, encourage by mouth intake   Consultants:  CCM for line placement  Procedures:  Line placement done 5/13  Antibiotics:  Flagyl by mouth 5/15-present   Objective: BP 160/100 mmHg  Pulse 102  Temp(Src) 98.8 F (37.1 C) (Oral)  Resp 18  Wt 95.3 kg (210 lb 1.6 oz)  SpO2 98%  Intake/Output Summary (Last 24 hours) at 03/20/15 1344 Last data filed at 03/20/15 0635  Gross per 24 hour  Intake 2937.5 ml  Output    504 ml  Net 2433.5 ml   Filed Weights   03/16/15 1122  03/18/15 0500 03/19/15 1232  Weight: 104.327 kg (230 lb) 91.7 kg (202 lb 2.6 oz) 95.3 kg (210 lb 1.6 oz)    Exam:   General:  More awake, nonverbal, opens eyes  Cardiovascular: Regular rate and rhythm, S1-S2  Respiratory: Clear to auscultation bilaterally  Abdomen: Soft, nontender, nondistended, positive bowel sounds  Musculoskeletal: No clubbing or cyanosis, trace pitting edema   Data Reviewed: Basic Metabolic Panel:  Recent Labs Lab 03/16/15 1930  03/19/15 0600 03/19/15 1112 03/19/15 1700 03/20/15 0008 03/20/15 0537  NA 175*  < > 156* 153* 149* 146* 144  K 2.9*  < > 2.9* 3.4* 3.6 3.2* 3.4*  CL >130*  < > 124* 125* 123* 119* 116*  CO2 25  < > 22 20* 20* 20* 21*  GLUCOSE 92  < > 129* 144* 122* 111* 116*  BUN 23*  < > 7 6 5* <5* <5*  CREATININE 0.77  < > 0.51 0.48 0.44 0.45 0.50  CALCIUM 8.2*  < > 8.7* 8.3* 8.4* 8.4* 8.3*  MG 2.5*  --   --   --   --   --  1.8  < > = values in this interval not displayed. Liver Function Tests:  Recent Labs Lab 03/16/15 1134  AST 31  ALT 34  ALKPHOS 140*  BILITOT 0.6  PROT 7.1  ALBUMIN 3.6   No results for input(s): LIPASE, AMYLASE in the last 168 hours. No results for input(s): AMMONIA in the last 168 hours. CBC:  Recent Labs Lab 03/16/15 1417 03/17/15  0510 03/20/15 0537  WBC 8.6 6.0 5.6  NEUTROABS 4.4  --   --   HGB 12.0 10.5* 10.2*  HCT 39.8 36.8 31.1*  MCV 99.5 99.7 91.5  PLT 112* 96* 89*   Cardiac Enzymes:   No results for input(s): CKTOTAL, CKMB, CKMBINDEX, TROPONINI in the last 168 hours. BNP (last 3 results)  Recent Labs  03/18/15 1240  BNP 69.7    ProBNP (last 3 results) No results for input(s): PROBNP in the last 8760 hours.  CBG: No results for input(s): GLUCAP in the last 168 hours.  Recent Results (from the past 240 hour(s))  Urine culture     Status: None   Collection Time: 03/16/15 10:48 AM  Result Value Ref Range Status   Specimen Description URINE, CATHETERIZED  Final   Special  Requests NONE  Final   Colony Count NO GROWTH Performed at [facility]   Final   Culture NO GROWTH Performed at [facility]   Final   Report Status 03/17/2015 FINAL  Final  Culture, blood (routine x 2)     Status: None (Preliminary result)   Collection Time: 03/16/15 11:34 AM  Result Value Ref Range Status   Specimen Description BLOOD LEFT HAND  Final   Special Requests BOTTLES DRAWN AEROBIC AND ANAEROBIC 5CC  Final   Culture   Final           BLOOD CULTURE RECEIVED NO GROWTH TO DATE CULTURE WILL BE HELD FOR 5 DAYS BEFORE ISSUING A FINAL NEGATIVE REPORT Performed at [facility]    Report Status PENDING  Incomplete  Culture, blood (routine x 2)     Status: None (Preliminary result)   Collection Time: 03/16/15 11:41 AM  Result Value Ref Range Status   Specimen Description BLOOD RIGHT HAND  Final   Special Requests BOTTLES DRAWN AEROBIC AND ANAEROBIC 5CC  Final   Culture   Final           BLOOD CULTURE RECEIVED NO GROWTH TO DATE CULTURE WILL BE HELD FOR 5 DAYS BEFORE ISSUING A FINAL NEGATIVE REPORT Performed at [facility]    Report Status PENDING  Incomplete  MRSA PCR Screening     Status: Abnormal   Collection Time: 03/16/15  4:36 PM  Result Value Ref Range Status   MRSA by PCR POSITIVE (A) NEGATIVE Final    Comment:        The GeneXpert MRSA Assay (FDA approved for NASAL specimens only), is one component of a comprehensive MRSA colonization surveillance program. It is not intended to diagnose MRSA infection nor to guide or monitor treatment for MRSA infections. RESULT CALLED TO, READ BACK BY AND VERIFIED WITH: Evans Lance [id number] @ 1934 BY J SCOTTON   Clostridium Difficile by PCR     Status: Abnormal   Collection Time: 03/20/15  9:55 AM  Result Value Ref Range Status   C difficile by pcr POSITIVE (A) NEGATIVE Final    Comment: CRITICAL RESULT CALLED TO, READ BACK BY AND VERIFIED WITH: E. HABIB RN AT 1200 ON 05.15.16 BY SHUEA        Studies: No results found.  Scheduled Meds: . antiseptic oral rinse  7 mL Mouth Rinse q12n4p  . chlorhexidine  15 mL Mouth Rinse BID  . Chlorhexidine Gluconate Cloth  6 each Topical Q0600  . lacosamide (VIMPAT) IV  100 mg Intravenous Q12H  . levETIRAcetam  1,500 mg Intravenous Q12H  . metoprolol  5 mg Intravenous 4 times  per day  . metroNIDAZOLE  500 mg Oral 3 times per day  . mupirocin ointment  1 application Nasal BID  . phenytoin (DILANTIN) IV  200 mg Intravenous BID    Continuous Infusions: . dextrose 5 % with kcl 10 mL/hr at 03/20/15 [id number]     Time spent: 25 minutes  Hollice EspyKRISHNAN,Petra Sargeant K  [facility] Pager [phone]. If 7PM-7AM, please contact night-coverage at [email or web address], password [email or web address] 03/20/2015, 1:44 PM  LOS: 4 days

## 2015-03-21 LAB — BASIC METABOLIC PANEL
Anion gap: 8 (ref 5–15)
BUN: <5 mg/dL — ABNORMAL LOW (ref 6–20)
CHLORIDE: 108 mmol/L (ref 101–111)
CO2: 23 mmol/L (ref 22–32)
Calcium: 8.4 mg/dL — ABNORMAL LOW (ref 8.9–10.3)
Creatinine, Ser: 0.46 mg/dL (ref 0.44–1.00)
GFR calc Af Amer: >60 mL/min (ref >60)
GFR calc non Af Amer: >60 mL/min (ref >60)
GLUCOSE: 93 mg/dL (ref 65–99)
Potassium: 2.9 mmol/L — ABNORMAL LOW (ref 3.5–5.1)
Sodium: 139 mmol/L (ref 135–145)

## 2015-03-21 MED ORDER — METOPROLOL TARTRATE 100 MG PO TABS
100.0000 mg | ORAL_TABLET | Freq: Two times a day (BID) | ORAL | Status: DC
  Administered 2015-03-21 – 2015-03-22 (×2): 100 mg
  Filled 2015-03-21 (×3): qty 1

## 2015-03-21 MED ORDER — CLONIDINE HCL 0.1 MG PO TABS
0.1000 mg | ORAL_TABLET | Freq: Three times a day (TID) | ORAL | Status: DC
  Administered 2015-03-21 – 2015-03-22 (×3): 0.1 mg via ORAL
  Filled 2015-03-21 (×6): qty 1

## 2015-03-21 MED ORDER — LACOSAMIDE 50 MG PO TABS
100.0000 mg | ORAL_TABLET | Freq: Two times a day (BID) | ORAL | Status: DC
  Administered 2015-03-21 – 2015-03-22 (×2): 100 mg via ORAL
  Filled 2015-03-21 (×2): qty 2

## 2015-03-21 MED ORDER — POTASSIUM CHLORIDE 10 MEQ/100ML IV SOLN
10.0000 meq | INTRAVENOUS | Status: AC
  Administered 2015-03-21 (×4): 10 meq via INTRAVENOUS
  Filled 2015-03-21 (×4): qty 100

## 2015-03-21 MED ORDER — LEVETIRACETAM 750 MG PO TABS
1500.0000 mg | ORAL_TABLET | Freq: Two times a day (BID) | ORAL | Status: DC
  Administered 2015-03-21 – 2015-03-22 (×2): 1500 mg via ORAL
  Filled 2015-03-21 (×3): qty 2

## 2015-03-21 MED ORDER — PHENYTOIN SODIUM EXTENDED 100 MG PO CAPS
200.0000 mg | ORAL_CAPSULE | Freq: Two times a day (BID) | ORAL | Status: DC
  Administered 2015-03-21 – 2015-03-22 (×2): 200 mg via ORAL
  Filled 2015-03-21 (×3): qty 2

## 2015-03-21 NOTE — Progress Notes (Signed)
MD text paged about 2.9 K+

## 2015-03-21 NOTE — Progress Notes (Signed)
CSW attempted to call Pt family members to discuss discharge planning. CSW left message on son's voicemail requesting a call back.  Chad CordialLauren Carter, LCSWA 03/21/2015 12:51 PM [phone]

## 2015-03-21 NOTE — Evaluation (Addendum)
Clinical/Bedside Swallow Evaluation Patient Details  Name: Meghan PoreLaura Marie Canny MRN: [id number] Date of Birth: 05/27/1966  Today's Date: 03/21/2015 Time: SLP Start Time (ACUTE ONLY): 1000 SLP Stop Time (ACUTE ONLY): 1025 SLP Time Calculation (min) (ACUTE ONLY): 25 min  Past Medical History:  Past Medical History  Diagnosis Date  . Hypertension   . SAH (subarachnoid hemorrhage)   . Seizure   . Stroke   . Tracheostomy status   . Tracheomalacia   . DVT (deep venous thrombosis)    Past Surgical History:  Past Surgical History  Procedure Laterality Date  . Abdominal hysterectomy    . 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  Pt adm with decreased LOC= found to have high sodium levels. Pt had h/o trach and PEG placement, PEG removed one month ago.  CT head negative for acute change, CXR 5/16 negative.     Assessment / Plan / Recommendation Clinical Impression  Pt presents with good tolerance of solid/puree/nectar liquid diet - no indications of airway compromise with nectar/puree.  Adequate albeit slow mastication of solid cracker with vertical mastication pattern without residuals.  She did demonstrate possible subtle cough after thin *evidenced by abdomen movement* raising concern for aspiration.  Also question if scar tissue from trach site may impact her swallowing.  Pt is aphonic, SLP unable to determine readiness for dietary advancement or aspiration clinically.  Pt did not follow commands for OME but noted to lean to the left.    Recommend to continue dys1/nectar diet currently and MBS to determine readiness for dietary advancement given last MBS was 11/15.   Dietary advancement may assist pt with hydration.  Informed RN of plan/concerns.    After discussion with SLP from facility, she reports seeing pt approximately 2 months ago last.  Pt had been on a puree/nectar diet with good intake/tolerance when she saw her.  Upon trials of solids/thin,  SNF SLP reports pt face would turn red and pt would produce near "cough" with 20-30% of trials concerning for aspiration.  Despite findings of MBS 11/15 recommend dys3 foods, pt did not functionally tolerate.  Recommend dys1/nectar diet continue with strict precautions.      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  Pt adm with decreased LOC= found to have high sodium levels. Pt had h/o trach and PEG placement, PEG removed one month ago.  CT head negative for acute change, CXR 5/16 negative.   Type of Study: Bedside swallow evaluation Diet Prior to this Study: Dysphagia 1 (puree);Nectar-thick liquids Temperature Spikes Noted: No Respiratory Status: Room air History of Recent Intubation: No Behavior/Cognition: Alert;Cooperative Oral Cavity - Dentition: Adequate natural dentition/normal for age Self-Feeding Abilities: Total assist Patient Positioning: Upright in bed Baseline Vocal Quality: Not observed;Aphonic Volitional Cough: Cognitively unable to elicit Volitional Swallow: Unable to elicit    Oral/Motor/Sensory Function Overall Oral Motor/Sensory Function: Impaired at baseline (pt did follow commands for evaluation, aphasic/apraxic)   Ice Chips Ice  chips: Not tested   Thin Liquid Thin Liquid: Impaired Presentation: Cup;Spoon Pharyngeal  Phase Impairments: Change in Vital Signs;Multiple swallows;Decreased hyoid-laryngeal movement    Nectar Thick Nectar Thick Liquid: Impaired Presentation:  Cup;Straw;Spoon Oral Phase Impairments: Reduced lingual movement/coordination;Impaired anterior to posterior transit Pharyngeal Phase Impairments: Suspected delayed Swallow;Multiple swallows   Honey Thick Honey Thick Liquid: Not tested   Puree Puree: Impaired Presentation: Spoon Oral Phase Impairments: Reduced lingual movement/coordination;Impaired anterior to posterior transit Pharyngeal Phase Impairments: Suspected delayed Swallow;Multiple swallows   Solid   GO    Solid: Impaired Oral Phase Impairments: Impaired anterior to posterior transit;Reduced lingual movement/coordination Oral Phase Functional Implications: Other (comment) (prolonged transiting) Pharyngeal Phase Impairments: Suspected delayed Swallow;Multiple swallows      Donavan Burnetamara Mclane Arora, MS [facility] SLP [phone]

## 2015-03-21 NOTE — Progress Notes (Signed)
CSW attempted to contact Pt son again to discuss discharge planning. CSW left second message.  Chad CordialLauren Carter, LCSWA 03/21/2015 5:11 PM [phone]

## 2015-03-21 NOTE — Progress Notes (Signed)
PROGRESS NOTE  [name] GNF:[id number] DOB: 01-Oct-1966 DOA: 03/16/2015 PCP: Ruthe Mannan, MD  HPI/Recap of past 24 hours: Patient is a 49 year old female with past medical history of quadriplegia and seizures after stroke who is bedbound from a nursing facility. She was brought in and admitted on 5/11 for decreased level of consciousness and found to be severely hypernatremic with sodium greater than 180. By later that afternoon, sodium had come down to 179.    Patient's sodium continues to trend down slowly. She spontaneously will open her eyes, but remains nonverbal.  On 5/15, nursing noted significant amount of diarrhea. Stool sent and positive for C. Difficile.  Pt seen by speech therapy on 5/16 & placed on restrictive diet.  Assessment/Plan: Principal Problem:   Hypernatremia: Continue IV fluids, sodium slowly improving. We lost IV access and central line placed after attempted PICC unable to be done. Monitor oxygen saturations considering we are aggressively rehydrating.  Sodium reaching normal range.  We'll observe sodium level now that IV fluids her off and started on by mouth Active Problems:   HTN (hypertension): Blood pressure stable  Hypokalemia: Replacing    Obesity: Patient meets criteria with BMI greater than 30   Brain aneurysm   Seizures: Changed all of her seizure medications to IV. One-on-one conversion.   Stroke C. difficile colitis: Started on by mouth Flagyl, blood pressure, white blood cell count normal.    Code Status: Full code  Family Communication: Left message with sister  Disposition Plan: Back to skilled nursing facility, perhaps in the next 24 hours as long as sodium remained stable on just by mouth intake only and diarrhea resolved   Consultants:  CCM for line placement  Procedures:  Line placement done 5/13  Antibiotics:  Flagyl by mouth 5/15-present   Objective: BP 161/85 mmHg  Pulse 76  Temp(Src) 98.3 F (36.8 C) (Oral)   Resp 18  Wt 89.3 kg (196 lb 13.9 oz)  SpO2 99%  Intake/Output Summary (Last 24 hours) at 03/21/15 1240 Last data filed at 03/21/15 0900  Gross per 24 hour  Intake    120 ml  Output      4 ml  Net    116 ml   Filed Weights   03/18/15 0500 03/19/15 1232 03/21/15 0409  Weight: 91.7 kg (202 lb 2.6 oz) 95.3 kg (210 lb 1.6 oz) 89.3 kg (196 lb 13.9 oz)    Exam:   General:  More awake today, interactive, smiling, nonverbal  Cardiovascular: Regular rate and rhythm, S1-S2  Respiratory: Clear to auscultation bilaterally  Abdomen: Soft, nontender, nondistended, positive bowel sounds  Musculoskeletal: No clubbing or cyanosis, trace pitting edema   Data Reviewed: Basic Metabolic Panel:  Recent Labs Lab 03/16/15 1930  03/19/15 1112 03/19/15 1700 03/20/15 0008 03/20/15 0537 03/21/15 0428  NA 175*  < > 153* 149* 146* 144 139  K 2.9*  < > 3.4* 3.6 3.2* 3.4* 2.9*  CL >130*  < > 125* 123* 119* 116* 108  CO2 25  < > 20* 20* 20* 21* 23  GLUCOSE 92  < > 144* 122* 111* 116* 93  BUN 23*  < > 6 5* <5* <5* <5*  CREATININE 0.77  < > 0.48 0.44 0.45 0.50 0.46  CALCIUM 8.2*  < > 8.3* 8.4* 8.4* 8.3* 8.4*  MG 2.5*  --   --   --   --  1.8  --   < > = values in this interval not displayed. Liver Function  Tests:  Recent Labs Lab 03/16/15 1134  AST 31  ALT 34  ALKPHOS 140*  BILITOT 0.6  PROT 7.1  ALBUMIN 3.6   No results for input(s): LIPASE, AMYLASE in the last 168 hours. No results for input(s): AMMONIA in the last 168 hours. CBC:  Recent Labs Lab 03/16/15 1417 03/17/15 0510 03/20/15 0537  WBC 8.6 6.0 5.6  NEUTROABS 4.4  --   --   HGB 12.0 10.5* 10.2*  HCT 39.8 36.8 31.1*  MCV 99.5 99.7 91.5  PLT 112* 96* 89*   Cardiac Enzymes:   No results for input(s): CKTOTAL, CKMB, CKMBINDEX, TROPONINI in the last 168 hours. BNP (last 3 results)  Recent Labs  03/18/15 1240  BNP 69.7    ProBNP (last 3 results) No results for input(s): PROBNP in the last 8760 hours.  CBG: No  results for input(s): GLUCAP in the last 168 hours.  Recent Results (from the past 240 hour(s))  Urine culture     Status: None   Collection Time: 03/16/15 10:48 AM  Result Value Ref Range Status   Specimen Description URINE, CATHETERIZED  Final   Special Requests NONE  Final   Colony Count NO GROWTH [facility]   Final   Culture NO GROWTH [facility]formed at [facility]   Final   Report Status 03/17/2015 FINAL  Final  Culture, blood (routine x 2)     Status: None (Preliminary result)   Collection Time: 03/16/15 11:34 AM  Result Value Ref Range Status   Specimen Description BLOOD LEFT HAND  Final   Special Requests BOTTLES DRAWN AEROBIC AND ANAEROBIC 5CC  Final   Culture   Final           BLOOD CULTURE RECEIVED NO GROWTH TO DATE CULTURE WILL BE HELD FOR 5 DAYS BEFORE ISSUING A FINAL NEGATIVE REPORT [facility]formed at [facility]    Report Status PENDING  Incomplete  Culture, blood (routine x 2)     Status: None (Preliminary result)   Collection Time: 03/16/15 11:41 AM  Result Value Ref Range Status   Specimen Description BLOOD RIGHT HAND  Final   Special Requests BOTTLES DRAWN AEROBIC AND ANAEROBIC 5CC  Final   Culture   Final           BLOOD CULTURE RECEIVED NO GROWTH TO DATE CULTURE WILL BE HELD FOR 5 DAYS BEFORE ISSUING A FINAL NEGATIVE REPORT Performed at [facility]    Report Status PENDING  Incomplete  MRSA PCR Screening     Status: Abnormal   Collection Time: 03/16/15  4:36 PM  Result Value Ref Range Status   MRSA by PCR POSITIVE (A) NEGATIVE Final    Comment:        The GeneXpert MRSA Assay (FDA approved for NASAL specimens only), is one component of a comprehensive MRSA colonization surveillance program. It is not intended to diagnose MRSA infection nor to guide or monitor treatment for MRSA infections. RESULT CALLED TO, READ BACK BY AND VERIFIED WITH: Evans LanceCHERYL DENNY,RN [id number] @ 1934 BY J SCOTTON   Clostridium Difficile by  PCR     Status: Abnormal   Collection Time: 03/20/15  9:55 AM  Result Value Ref Range Status   C difficile by pcr POSITIVE (A) NEGATIVE Final    Comment: CRITICAL RESULT CALLED TO, READ BACK BY AND VERIFIED WITH: E. HABIB RN AT 1200 ON 05.15.16 BY SHUEA      Studies: No results found.  Scheduled Meds: .  antiseptic oral rinse  7 mL Mouth Rinse q12n4p  . chlorhexidine  15 mL Mouth Rinse BID  . Chlorhexidine Gluconate Cloth  6 each Topical Q0600  . lacosamide (VIMPAT) IV  100 mg Intravenous Q12H  . levETIRAcetam  1,500 mg Intravenous Q12H  . metoprolol  5 mg Intravenous 4 times per day  . metroNIDAZOLE  500 mg Oral 3 times per day  . phenytoin (DILANTIN) IV  200 mg Intravenous BID    Continuous Infusions:     Time spent: 15 minutes  Hollice EspyKRISHNAN,Jessen Siegman K  [facility] Pager [phone]. If 7PM-7AM, please contact night-coverage at [email or web address], password [email or web address] 03/21/2015, 12:40 PM  LOS: 5 days

## 2015-03-22 LAB — CBC
HCT: 29 % — ABNORMAL LOW (ref 36.0–46.0)
Hemoglobin: 10.1 g/dL — ABNORMAL LOW (ref 12.0–15.0)
MCH: 30.8 pg (ref 26.0–34.0)
MCHC: 34.8 g/dL (ref 30.0–36.0)
MCV: 88.4 fL (ref 78.0–100.0)
PLATELETS: 108 10*3/uL — AB (ref 150–400)
RBC: 3.28 MIL/uL — AB (ref 3.87–5.11)
RDW: 13.5 % (ref 11.5–15.5)
WBC: 5.4 10*3/uL (ref 4.0–10.5)

## 2015-03-22 LAB — CULTURE, BLOOD (ROUTINE X 2)
CULTURE: NO GROWTH
Culture: NO GROWTH

## 2015-03-22 LAB — BASIC METABOLIC PANEL
Anion gap: 6 (ref 5–15)
CALCIUM: 8.2 mg/dL — AB (ref 8.9–10.3)
CO2: 23 mmol/L (ref 22–32)
CREATININE: 0.45 mg/dL (ref 0.44–1.00)
Chloride: 107 mmol/L (ref 101–111)
GFR calc Af Amer: >60 mL/min (ref >60)
Glucose, Bld: 104 mg/dL — ABNORMAL HIGH (ref 65–99)
Potassium: 3 mmol/L — ABNORMAL LOW (ref 3.5–5.1)
Sodium: 136 mmol/L (ref 135–145)

## 2015-03-22 MED ORDER — VITAMIN C 500 MG PO TABS: 500.0000 mg | ORAL_TABLET | Freq: Every day | ORAL | Status: DC

## 2015-03-22 MED ORDER — METOPROLOL TARTRATE 100 MG PO TABS: 100.0000 mg | ORAL_TABLET | Freq: Two times a day (BID) | ORAL | Status: DC

## 2015-03-22 MED ORDER — CHOLESTYRAMINE 4 G PO PACK: 4.0000 g | PACK | Freq: Three times a day (TID) | ORAL | Status: DC

## 2015-03-22 MED ORDER — ACETAMINOPHEN 325 MG PO TABS: 650.0000 mg | ORAL_TABLET | Freq: Every day | ORAL | Status: DC

## 2015-03-22 MED ORDER — METRONIDAZOLE 500 MG PO TABS: 500.0000 mg | ORAL_TABLET | Freq: Three times a day (TID) | ORAL | Status: AC

## 2015-03-22 MED ORDER — FAMOTIDINE 40 MG/5ML PO SUSR: 20.0000 mg | Freq: Two times a day (BID) | ORAL | Status: DC

## 2015-03-22 NOTE — Progress Notes (Signed)
Speech Language Pathology Treatment: Dysphagia  Patient Details Name: [name] MRN: [id number] DOB: [birth year] Today's Date: 03/22/2015 Time: 1410-1430 SLP Time Calculation (min) (ACUTE ONLY): 20 min  Assessment / Plan / Recommendation Clinical Impression  Per discussion with nurse tech, pt did demonstrate minimal "cough" with v8 juice at lunch.  She remains afebrile with lungs decreased.  SLP provided pt with icecream, applesauce and thin via tsp.  Delayed multiple swallows noted across consistencies but no s/s of aspiration.  Pt accepted only a few boluses of intake given by SLP.   Concern continues to be present for adequacy of hydration/nutrition with current level of dysphagia.  Pt also not appropriate for dietary advancement due to chronicity of dysphagia and laborious feeding.  Recommend consider follow up at SNF to establish hydration schedule as pt currently without PEG or long term alternative way to receive hydration.     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  Pt adm with decreased LOC= found to have high sodium levels. Pt had h/o trach and PEG placement, PEG removed one month ago.  CT head negative for acute change, CXR 5/16 negative.     Pertinent Vitals Pain Assessment: Faces Faces Pain Scale: No hurt  SLP Plan  All goals met    Recommendations Diet recommendations: Dysphagia 1 (puree);Nectar-thick liquid Medication Administration: Crushed with puree Supervision: Full supervision/cueing for compensatory strategies Compensations: Slow rate;Small sips/bites (allow time for delayed dry swallows) Postural Changes and/or Swallow Maneuvers: Seated upright 90 degrees;Upright 30-60 min after meal              Oral Care Recommendations: Oral care BID Follow up Recommendations: Skilled Nursing facility (? to estabish feeeding/hydration schedule given pt without PEG currenlty) Plan: All goals met    [place],  [place] SLP [phone]

## 2015-03-22 NOTE — Plan of Care (Signed)
Problem: Phase I Progression Outcomes Goal: OOB as tolerated unless otherwise ordered Outcome: Not Met (add Reason) Bed Bound

## 2015-03-22 NOTE — Discharge Summary (Signed)
Discharge Summary  [name] QIO:[id number] DOB: 11/05/1966  PCP: Ruthe Mannan, MD  Admit date: 03/16/2015 Discharge date: 03/22/2015  Time spent: 25 minutes  Recommendations for Outpatient Follow-up:  1. Patient is being discharged on a dysphagia 1 with thick liquid diet 2. Follow-up lab work: Patient should have a repeat sodium level checked on Friday 5/20 3. New medication: Flagyl 500 mg by mouth every 8 hours for the next 12 days  4. Patient should be reassessed by speech therapy for swallowing ability and possible upgrading of diet restriction in approximately 2-3 weeks.  Discharge Diagnoses:  Active Hospital Problems   Diagnosis Date Noted  . Hypernatremia 03/16/2015  . C. difficile diarrhea 03/20/2015  . Difficult intravenous access   . Dysphagia   . Dehydration 03/16/2015  . Seizures 07/05/2014  . Stroke 07/05/2014  . Brain aneurysm 08/17/2013  . HTN (hypertension) 02/25/2012  . Obesity 02/25/2012    Resolved Hospital Problems   Diagnosis Date Noted Date Resolved  No resolved problems to display.    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 She was brought in and admitted on 5/11 for decreased level of consciousness and found to be severely hypernatremic with sodium greater than 180.  Hospital Course:  Principal Problem:   Hypernatremia: With initial IV fluids, sodium later that day down to 179. Patient was continued on IV fluids and her sodium continued to come down slowly. Oxygen levels remained stable. By 5/13, patient started to awaken.  By 5/15, sodium within normal range and at this point she was more alert. IV fluids discontinued and started on by mouth by mouth and by day of discharge, sodium at 136. Active Problems:   HTN (hypertension): Blood pressures remained stable during this hospitalization. Medications resumed.    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    Seizures: While patient nothing by mouth, medication changed IV. Change back after.     Difficult intravenous access: IV access lost and patient had central line placed 5/13    C. difficile diarrhea: Patient started having diarrhea on 5/15. C. difficile cultures positive. Started on by mouth Flagyl. By 5/17, solid bowel movements. White count normalized. Patient will continue on by mouth Flagyl 3 times a day for the next 12 days to complete a two-week course   Consultants:  CCM for line placement  Procedures:  Line placement done 5/13  Discharge Exam: BP 133/77 mmHg  Pulse 69  Temp(Src) 98.2 F (36.8 C) (Oral)  Resp 18  Wt 89.3 kg (196 lb 13.9 oz)  SpO2 98%  General: Alert, chronic mentation issues Cardiovascular: Regular rate and rhythm, S1-S2 Respiratory: Clear to auscultation bilaterally  Discharge Instructions You were cared for by a hospitalist during your hospital stay. If you have any questions about your discharge medications or the care you received while you were in the hospital after you  are discharged, you can call the unit and asked to speak with the hospitalist on call if the hospitalist that took care of you is not available. Once you are discharged, your primary care physician will handle any further medical issues. Please note that NO REFILLS for any discharge medications will be authorized once you are  discharged, as it is imperative that you return to your primary care physician (or establish a relationship with a primary care physician if you do not have one) for your aftercare needs so that they can reassess your need for medications and monitor your lab values.  Discharge Instructions    Bed rest    Complete by:  As directed      DIET - DYS 1    Complete by:  As directed   Fluid consistency:  Nectar Thick            Medication List    STOP taking these medications        albuterol (2.5 MG/3ML) 0.083% nebulizer solution  Commonly known as:  PROVENTIL     antiseptic oral rinse Liqd     chlorhexidine 0.12 % solution  Commonly known as:  PERIDEX     feeding supplement (GLUCERNA 1.2 CAL) Liqd     free water Soln      TAKE these medications        acetaminophen 325 MG tablet  Commonly known as:  TYLENOL  Take 650 mg by mouth every 4 (four) hours as needed (for elevated temperatures).     acetaminophen 325 MG tablet  Commonly known as:  TYLENOL  Take 2 tablets (650 mg total) by mouth daily.     carboxymethylcellulose 0.5 % Soln  Commonly known as:  REFRESH PLUS  Place 2 drops into both eyes 3 (three) times daily.     cholestyramine 4 G packet  Commonly known as:  QUESTRAN  Take 1 packet (4 g total) by mouth 3 (three) times daily.     cloNIDine 0.2 MG tablet  Commonly known as:  CATAPRES  Take 1 tablet (0.2 mg total) by mouth 3 (three) times daily.     famotidine 40 MG/5ML suspension  Commonly known as:  PEPCID  Take 2.5 mLs (20 mg total) by mouth every 12 (twelve) hours.     lacosamide 50 MG Tabs tablet  Commonly known as:  VIMPAT  Give 100 mg by tube 2 (two) times daily.     levETIRAcetam 750 MG tablet  Commonly known as:  KEPPRA  Take 1,500 mg by mouth 2 (two) times daily.     meloxicam 7.5 MG tablet  Commonly known as:  MOBIC  Take 7.5 mg by mouth daily.     metoprolol 100 MG tablet  Commonly known as:  LOPRESSOR  Take 1 tablet (100 mg total) by  mouth 2 (two) times daily.     metroNIDAZOLE 500 MG tablet  Commonly known as:  FLAGYL  Take 1 tablet (500 mg total) by mouth every 8 (eight) hours.     minoxidil 10 MG tablet  Commonly known as:  LONITEN  Place 1 tablet (10 mg total) into feeding tube 2 (two) times daily.     multivitamin with minerals Tabs tablet  Take 1 tablet by mouth daily with breakfast.     phenytoin 100 MG ER capsule  Commonly known as:  DILANTIN  Take 200 mg by mouth 2 (two) times daily.     traZODone 50 MG tablet  Commonly known as:  DESYREL  Take 25 mg by mouth at bedtime as needed.     vitamin C 500 MG tablet  Commonly known as:  ASCORBIC ACID  Take 1 tablet (500 mg total) by mouth daily.     Vitamin D3 5000 UNITS Tabs  Take 1 tablet by mouth daily with breakfast.       Allergies  Allergen Reactions  . Amlodipine Swelling  . Penicillins Swelling  . Latex Rash    Unknown   . Other Other (See Comments)    Natural Rubber- Unknown       The results of significant diagnostics from this hospitalization (including imaging, microbiology, ancillary and laboratory) are listed below for reference.    Significant Diagnostic Studies: Ct Head Wo Contrast  03/16/2015   CLINICAL DATA:  One week history of increased lethargy.  EXAM: CT HEAD WITHOUT CONTRAST  TECHNIQUE: Contiguous axial images were obtained from the base of the skull through the vertex without intravenous contrast.  COMPARISON:  Head CT July 07, 2014 and July 27, 2014 brain MRI  FINDINGS: There is extensive ex vacuo phenomenon in both frontal lobes with mild residua of prior hemorrhage in these areas. The blood breakdown products in these areas are much better seen on MR than CT. The encephalomalacia extends along the medial frontal lobes to the vertex and extends posteriorly into the medial proximal parietal lobes, stable. There is lateral and third ventricular enlargement, stable. There is an aneurysm clip just inferior and anterior  to the third ventricle, extending slightly toward the left, stable. There is no intracranial mass or acute hemorrhage. There is no extra-axial fluid collection or midline shift. There is small vessel disease in the centra semiovale bilaterally, more on the left than on the right with prior infarct in the left frontal lobe adjacent to the encephalomalacia, stable. No new gray-white compartment lesions are identified. There is evidence of prior craniotomy in the left frontal lobe region. No new bone lesions are identified. Mastoid air cells bilaterally are clear.  IMPRESSION: Prior infarcts and encephalomalacia with bilateral medial frontal and anterior medial parietal lobe encephalomalacia with residua of prior hemorrhage is in these areas. No acute hemorrhage is seen on this study. No new gray-white compartment lesion. No acute infarct. No mass or mass effect. Stable atrophy. Postoperative changes noted.   Electronically Signed   By: Bretta Bang III M.D.   On: 03/16/2015 13:32   Dg Chest Port 1 View  03/18/2015   CLINICAL DATA:  Central line placement on the left  EXAM: PORTABLE CHEST - 1 VIEW  COMPARISON:  03/16/2015  FINDINGS: Artifact overlies the chest. Left internal jugular central line has its tip at the SVC RA junction. Heart size is at the upper limits of normal. The aorta is unfolded. The patient has taken a poor inspiration the lungs are likely clear. No pneumothorax.  IMPRESSION: No pneumothorax post central line placement. Catheter tip at the SVC RA junction.   Electronically Signed   By: Paulina Fusi M.D.   On: 03/18/2015 11:30   Dg Chest Port 1 View  03/16/2015   CLINICAL DATA:  One day history of shortness of breath  EXAM: PORTABLE CHEST - 1 VIEW  COMPARISON:  February 07, 2015  FINDINGS: There is no edema or consolidation. Heart size and pulmonary vascularity are normal. No adenopathy. No bone lesions.  IMPRESSION: No edema or consolidation.   Electronically Signed   By: Bretta Bang III  M.D.   On: 03/16/2015 11:10  Microbiology: Recent Results (from the past 240 hour(s))  Urine culture     Status: None   Collection Time: 03/16/15 10:48 AM  Result Value Ref Range Status   Specimen Description URINE, CATHETERIZED  Final   Special Requests NONE  Final   Colony Count NO GROWTH Performed at [facility]   Final   Culture NO GROWTH Performed at [facility]   Final   Report Status 03/17/2015 FINAL  Final  Culture, blood (routine x 2)     Status: None   Collection Time: 03/16/15 11:34 AM  Result Value Ref Range Status   Specimen Description BLOOD LEFT HAND  Final   Special Requests BOTTLES DRAWN AEROBIC AND ANAEROBIC 5CC  Final   Culture   Final    NO GROWTH 5 DAYS Performed at [facility]    Report Status 03/22/2015 FINAL  Final  Culture, blood (routine x 2)     Status: None   Collection Time: 03/16/15 11:41 AM  Result Value Ref Range Status   Specimen Description BLOOD RIGHT HAND  Final   Special Requests BOTTLES DRAWN AEROBIC AND ANAEROBIC 5CC  Final   Culture   Final    NO GROWTH 5 DAYS Performed at [facility]    Report Status 03/22/2015 FINAL  Final  MRSA PCR Screening     Status: Abnormal   Collection Time: 03/16/15  4:36 PM  Result Value Ref Range Status   MRSA by PCR POSITIVE (A) NEGATIVE Final    Comment:        The GeneXpert MRSA Assay (FDA approved for NASAL specimens only), is one component of a comprehensive MRSA colonization surveillance program. It is not intended to diagnose MRSA infection nor to guide or monitor treatment for MRSA infections. RESULT CALLED TO, READ BACK BY AND VERIFIED WITH: Evans LanceHERYL DENNY,RN [id number] @ 1934 BY J SCOTTON   Clostridium Difficile by PCR     Status: Abnormal   Collection Time: 03/20/15  9:55 AM  Result Value Ref Range Status   C difficile by pcr POSITIVE (A) NEGATIVE Final    Comment: CRITICAL RESULT CALLED TO, READ BACK BY AND VERIFIED WITH: E. HABIB RN AT 1200 ON  05.15.16 BY SHUEA      Labs: Basic Metabolic Panel:  Recent Labs Lab 03/16/15 1930  03/19/15 1700 03/20/15 0008 03/20/15 0537 03/21/15 0428 03/22/15 0540  NA 175*  < > 149* 146* 144 139 136  K 2.9*  < > 3.6 3.2* 3.4* 2.9* 3.0*  CL >130*  < > 123* 119* 116* 108 107  CO2 25  < > 20* 20* 21* 23 23  GLUCOSE 92  < > 122* 111* 116* 93 104*  BUN 23*  < > 5* <5* <5* <5* <5*  CREATININE 0.77  < > 0.44 0.45 0.50 0.46 0.45  CALCIUM 8.2*  < > 8.4* 8.4* 8.3* 8.4* 8.2*  MG 2.5*  --   --   --  1.8  --   --   < > = values in this interval not displayed. Liver Function Tests:  Recent Labs Lab 03/16/15 1134  AST 31  ALT 34  ALKPHOS 140*  BILITOT 0.6  PROT 7.1  ALBUMIN 3.6   No results for input(s): LIPASE, AMYLASE in the last 168 hours. No results for input(s): AMMONIA in the last 168 hours. CBC:  Recent Labs Lab 03/16/15 1417 03/17/15 0510 03/20/15 0537 03/22/15 0540  WBC 8.6 6.0 5.6 5.4  NEUTROABS 4.4  --   --   --  HGB 12.0 10.5* 10.2* 10.1*  HCT 39.8 36.8 31.1* 29.0*  MCV 99.5 99.7 91.5 88.4  PLT 112* 96* 89* 108*   Cardiac Enzymes: No results for input(s): CKTOTAL, CKMB, CKMBINDEX, TROPONINI in the last 168 hours. BNP: BNP (last 3 results)  Recent Labs  03/18/15 1240  BNP 69.7    ProBNP (last 3 results) No results for input(s): PROBNP in the last 8760 hours.  CBG: No results for input(s): GLUCAP in the last 168 hours.     Signed:  Hollice Espy  [facility] 03/22/2015, 2:37 PM

## 2015-03-22 NOTE — Plan of Care (Signed)
Problem: Phase III Progression Outcomes Goal: Voiding independently Outcome: Completed/Met Date Met:  03/22/15 Patient incontinence of urine and stool

## 2015-03-22 NOTE — Plan of Care (Signed)
Problem: Consults Goal: General Medical Patient Education See Patient Education Module for specific education.  Outcome: Not Met (add Reason) Unable to access due to patient's in ability to communicate

## 2015-03-22 NOTE — Progress Notes (Signed)
L IJ D/C per MD order/cath intact/vaseline pressure applied/pressure x5 min/no bleeding.

## 2015-03-22 NOTE — Progress Notes (Signed)
Patient discharge back to SNF, alert and orientation in question due to patient's inabililty to communicate verbally, discharge package given to [facility] for delivery to SNF, patient in stable condition at this time

## 2015-03-22 NOTE — Progress Notes (Signed)
CSW spoke with Pt son and brother who agreed to have [facility] now until they can facilitate another long-term option for Pt.  [facility](Karen) agreeable for Pt to return today.  CSW notified family. Called [facility] for transport. RN notified. DC packet completed and placed on chart.  CSW signing off but available if needs arise.  Chad CordialLauren Carter, LCSWA 03/22/2015 2:42 PM [phone]

## 2015-05-06 ENCOUNTER — Ambulatory Visit (INDEPENDENT_AMBULATORY_CARE_PROVIDER_SITE_OTHER): Payer: 59 | Admitting: Neurology

## 2015-05-06 ENCOUNTER — Encounter: Payer: Self-pay | Admitting: Neurology

## 2015-05-06 VITALS — BP 106/82 | HR 92 | Resp 16

## 2015-05-06 DIAGNOSIS — G40209 Localization-related (focal) (partial) symptomatic epilepsy and epileptic syndromes with complex partial seizures, not intractable, without status epilepticus: Secondary | ICD-10-CM

## 2015-05-06 DIAGNOSIS — Z8673 Personal history of transient ischemic attack (TIA), and cerebral infarction without residual deficits: Secondary | ICD-10-CM

## 2015-05-06 NOTE — Patient Instructions (Signed)
1. Continue all your current seizure medications 2. CBC, CMP, Dilantin level every 6 months 3. Follow-up in 6 months

## 2015-05-06 NOTE — Progress Notes (Signed)
NEUROLOGY FOLLOW UP OFFICE NOTE  [name] [id number]  HISTORY OF PRESENT ILLNESS: I had the pleasure of seeing [name] in follow-up in the neurology clinic on 05/06/2015.  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 She is again accompanied by her sister who provides the history today. Since her last visit, she was admitted to [facility] for decreased responsiveness due to hypernatremia. She also had C. Diff and MRSA. She has recovered from this and her sister reports that she has been doing better, more alert, trying to move her thumbs. She remains non-verbal. She denies any seizures. She continues on Vimpat 100mg  BID, Keppra 1500mg  BID, and Dilantin 200mg  BID.  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 She underwent craniotomy for clip ligation in 10/2013, per records, the procedure was complicated by intraoperative seizures despite keppra load, ativan, and propofol so she was loaded with fosphenytoin. She also had intraoperative hemorrhage. Imaging post op showed infarct extension with bilateral ACA infarcts and left sided caudate infarct. When she was discharged to rehab, she was unable to move all her extremities with note of bilateral hand contractures, non-verbal, tracks. Her son reports that she was moved to a rehab center in [place], where she did quite well, she could grasp balls, state where she was born, where she was at, and could take sips of water. He reports that she had a seizure in February, April, and most recently in May 2015. The son only witnessed one of the seizures where she was biting down with head turn to the left. They deny any staring episodes, stating she is always interactive.After the last seizure while still in [place], he reports that she had regressed. She smiles when spoken to, easily gets frustrated. She would "huff and  puff" and become even more difficult to understand. She shakes her head yes and no.  Records and images were personally reviewed where available. I personally reviewed MRI brain without contrast which showed remote large medial frontal and parietal lobe infarcts with encephalomalacia and laminar necrosis, prior left frontal temporal craniotomy, encephalomalacia in the anterior left frontal lobe and left temporal lobe. Her EEG from [place] had shown periodic discharges from the left hemisphere. Repeat EEG done in our office was normal. She had episodes of hyperventilation and restlessness which did not show any EEG changes.   PAST MEDICAL HISTORY: Past Medical History  Diagnosis Date  . Hypertension   . SAH (subarachnoid hemorrhage)   . Seizure   . Stroke   . Tracheostomy status   . Tracheomalacia   . DVT (deep venous thrombosis)     MEDICATIONS: Current Outpatient Prescriptions on File Prior to Visit  Medication Sig Dispense Refill  . acetaminophen (TYLENOL) 325 MG tablet 2 tablets once daily for discomfort    . acetaminophen (TYLENOL) 325 MG tablet Take 2 tablets (650 mg total) by mouth daily. (Patient taking differently: Take 650 mg by mouth every 4 (four) hours as needed. As needed for elevated temperature)    . carboxymethylcellulose (REFRESH PLUS) 0.5 % SOLN Place 2 drops into both eyes 3 (three) times daily.    . Cholecalciferol (VITAMIN D3) 5000 UNITS TABS Take 1 tablet by mouth daily with breakfast.    . cholestyramine (QUESTRAN) 4 G packet Take 1 packet (4 g total) by mouth 3 (three) times daily. 60 each 12  .  cloNIDine (CATAPRES) 0.2 MG tablet Take 1 tablet (0.2 mg total) by mouth 3 (three) times daily.    . famotidine (PEPCID) 40 MG/5ML suspension Take 2.5 mLs (20 mg total) by mouth every 12 (twelve) hours. 50 mL 0  . lacosamide (VIMPAT) 50 MG TABS tablet Give 100 mg by tube 2 (two) times daily.     Marland Kitchen levETIRAcetam (KEPPRA) 750 MG tablet Take 1,500 mg by mouth 2 (two) times  daily.    . meloxicam (MOBIC) 7.5 MG tablet Take 7.5 mg by mouth daily.    . metoprolol (LOPRESSOR) 100 MG tablet Take 1 tablet (100 mg total) by mouth 2 (two) times daily.    . minoxidil (LONITEN) 10 MG tablet Place 1 tablet (10 mg total) into feeding tube 2 (two) times daily.    . Multiple Vitamin (MULTIVITAMIN WITH MINERALS) TABS tablet Take 1 tablet by mouth daily with breakfast.    . phenytoin (DILANTIN) 100 MG ER capsule Take 200 mg by mouth 2 (two) times daily.    . traZODone (DESYREL) 50 MG tablet Take 25 mg by mouth at bedtime as needed.      No current facility-administered medications on file prior to visit.    ALLERGIES: Allergies  Allergen Reactions  . Amlodipine Swelling  . Penicillins Swelling  . Latex Rash    Unknown   . Other Other (See Comments)    Natural Rubber- Unknown     FAMILY HISTORY: Family History  Problem Relation Age of Onset  . Heart disease Mother 51    MI    SOCIAL HISTORY: History   Social History  . Marital Status: Single    Spouse Name: N/A  . Number of Children: N/A  . Years of Education: N/A   Occupational History  . Not on file.   Social History Main Topics  . Smoking status: Former Smoker    Types: Cigarettes  . Smokeless tobacco: Never Used     Comment: trying to quit-2 cigarettes daily  . Alcohol Use: No     Comment: Very seldom  . Drug Use: No  . Sexual Activity: Not on file   Other Topics Concern  . Not on file   Social History Narrative    REVIEW OF SYSTEMS unable to obtain due to patient being non-verbal  PHYSICAL EXAM: Filed Vitals:   05/06/15 1418  BP: 106/82  Pulse: 92  Resp: 16   General: No acute distress, sitting on wheelchair, smiles when name is called, appears to try to follow simple commands, lifts each thumb up when asked Head: Normocephalic/atraumatic Eyes: Fundoscopic exam shows bilateral sharp discs, no vessel changes, exudates, or hemorrhages Neck: supple, no paraspinal tenderness, full  range of motion Back: No paraspinal tenderness Heart: regular rate and rhythm Lungs: Clear to auscultation bilaterally. Vascular: No carotid bruits. Skin/Extremities: No rash, no edema, both hands in splint with contractures  Neurological Exam: Mental status: alert, non-verbal, does not follow commands  Cranial nerves: CN I: not tested CN II: pupils equal, round and reactive to light, visual fields intact, fundi unremarkable. CN III, IV, VI: full range of motion, no nystagmus, no ptosis CN V: unable to test CN VII: upper and lower face symmetric CN VIII: unable to test CN IX, X: unable to test CN XI: sternocleidomastoid and trapezius muscles intact CN XII: would not open mouth to command Bulk & Tone: increased tone on both UE, flaccid on both LE (unchanged) Motor: at least 3/5 on both UE, 0/5 both LE but  withdrawal response to pain Sensation: does not withdraw to pain on both UE, withdrawal response to pain on both feet (similar to prior) Deep Tendon Reflexes: +2 on both UE, absent reflexes on both LE, no ankle clonus Plantar responses: mute bilaterally Cerebellar: unable to test Gait: deferred Tremor: none  IMPRESSION: This is an unfortunate 49 yo woman with a 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 No seizures since May 2015. Her family was concerned that since her last seizure, she had regressed with communication and motor skills that she had slowly started to regain while in rehab in [place]. MRI brain did not show any acute changes, there was encephalomalacia in the bilateral ACA territories, and left frontal and temporal regions, which would account for the bilateral leg weakness and aphasia. Repeat EEG normal. I discussed with her sister that although she may have small gains with alertness, she likely has plateaued with functional status. Would recommend continued passive  movements/exercise of extremities to prevent worsening contractures. Continue current AEDs. Check CBC, CMP, Dilantin level every 6 months. She will follow-up in 6 months, family knows to call for any problems in the interim.   Thank you for allowing me to participate in her care.  Please do not hesitate to call for any questions or concerns.  The duration of this appointment visit was 15 minutes of face-to-face time with the patient.  Greater than 50% of this time was spent in counseling, explanation of diagnosis, planning of further management, and coordination of care.   Patrcia DollyKaren Rosey Eide, M.D.   CC: Dr. Dayton MartesAron

## 2015-06-01 ENCOUNTER — Inpatient Hospital Stay (HOSPITAL_COMMUNITY)
Admission: EM | Admit: 2015-06-01 | Discharge: 2015-06-06 | DRG: 640 | Disposition: A | Discharge status: Skilled Nursing Facility | Payer: 59 | Attending: Internal Medicine | Admitting: Internal Medicine

## 2015-06-01 ENCOUNTER — Emergency Department (HOSPITAL_COMMUNITY): Payer: 59

## 2015-06-01 ENCOUNTER — Encounter (HOSPITAL_COMMUNITY): Payer: Self-pay | Admitting: Emergency Medicine

## 2015-06-01 DIAGNOSIS — G40209 Localization-related (focal) (partial) symptomatic epilepsy and epileptic syndromes with complex partial seizures, not intractable, without status epilepticus: Secondary | ICD-10-CM | POA: Diagnosis present

## 2015-06-01 DIAGNOSIS — Z79899 Other long term (current) drug therapy: Secondary | ICD-10-CM | POA: Diagnosis not present

## 2015-06-01 DIAGNOSIS — Z880 Allergy status to penicillin: Secondary | ICD-10-CM | POA: Diagnosis not present

## 2015-06-01 DIAGNOSIS — E669 Obesity, unspecified: Secondary | ICD-10-CM | POA: Diagnosis present

## 2015-06-01 DIAGNOSIS — I959 Hypotension, unspecified: Secondary | ICD-10-CM | POA: Diagnosis present

## 2015-06-01 DIAGNOSIS — R0600 Dyspnea, unspecified: Secondary | ICD-10-CM

## 2015-06-01 DIAGNOSIS — G9340 Encephalopathy, unspecified: Secondary | ICD-10-CM | POA: Diagnosis not present

## 2015-06-01 DIAGNOSIS — J398 Other specified diseases of upper respiratory tract: Secondary | ICD-10-CM | POA: Diagnosis present

## 2015-06-01 DIAGNOSIS — I10 Essential (primary) hypertension: Secondary | ICD-10-CM | POA: Diagnosis not present

## 2015-06-01 DIAGNOSIS — Z7401 Bed confinement status: Secondary | ICD-10-CM | POA: Diagnosis not present

## 2015-06-01 DIAGNOSIS — D696 Thrombocytopenia, unspecified: Secondary | ICD-10-CM | POA: Diagnosis present

## 2015-06-01 DIAGNOSIS — R532 Functional quadriplegia: Secondary | ICD-10-CM | POA: Diagnosis present

## 2015-06-01 DIAGNOSIS — Z6831 Body mass index (BMI) 31.0-31.9, adult: Secondary | ICD-10-CM | POA: Diagnosis not present

## 2015-06-01 DIAGNOSIS — E870 Hyperosmolality and hypernatremia: Principal | ICD-10-CM | POA: Diagnosis present

## 2015-06-01 DIAGNOSIS — E46 Unspecified protein-calorie malnutrition: Secondary | ICD-10-CM | POA: Diagnosis present

## 2015-06-01 DIAGNOSIS — Z888 Allergy status to other drugs, medicaments and biological substances status: Secondary | ICD-10-CM | POA: Diagnosis not present

## 2015-06-01 DIAGNOSIS — R569 Unspecified convulsions: Secondary | ICD-10-CM | POA: Diagnosis not present

## 2015-06-01 DIAGNOSIS — R0902 Hypoxemia: Secondary | ICD-10-CM

## 2015-06-01 DIAGNOSIS — R4701 Aphasia: Secondary | ICD-10-CM | POA: Diagnosis present

## 2015-06-01 DIAGNOSIS — Z8673 Personal history of transient ischemic attack (TIA), and cerebral infarction without residual deficits: Secondary | ICD-10-CM | POA: Diagnosis not present

## 2015-06-01 DIAGNOSIS — L89152 Pressure ulcer of sacral region, stage 2: Secondary | ICD-10-CM | POA: Diagnosis present

## 2015-06-01 DIAGNOSIS — Z87891 Personal history of nicotine dependence: Secondary | ICD-10-CM

## 2015-06-01 DIAGNOSIS — Z91040 Latex allergy status: Secondary | ICD-10-CM

## 2015-06-01 DIAGNOSIS — I69365 Other paralytic syndrome following cerebral infarction, bilateral: Secondary | ICD-10-CM

## 2015-06-01 DIAGNOSIS — Z930 Tracheostomy status: Secondary | ICD-10-CM | POA: Diagnosis not present

## 2015-06-01 DIAGNOSIS — E878 Other disorders of electrolyte and fluid balance, not elsewhere classified: Secondary | ICD-10-CM | POA: Diagnosis present

## 2015-06-01 DIAGNOSIS — R400 Somnolence: Secondary | ICD-10-CM | POA: Diagnosis present

## 2015-06-01 DIAGNOSIS — Z8249 Family history of ischemic heart disease and other diseases of the circulatory system: Secondary | ICD-10-CM

## 2015-06-01 DIAGNOSIS — Z9109 Other allergy status, other than to drugs and biological substances: Secondary | ICD-10-CM | POA: Diagnosis not present

## 2015-06-01 DIAGNOSIS — R1310 Dysphagia, unspecified: Secondary | ICD-10-CM | POA: Diagnosis present

## 2015-06-01 LAB — CBC WITH DIFFERENTIAL/PLATELET
Basophils Absolute: 0 10*3/uL (ref 0.0–0.1)
Basophils Relative: 0 % (ref 0–1)
Eosinophils Absolute: 0.2 10*3/uL (ref 0.0–0.7)
Eosinophils Relative: 3 % (ref 0–5)
HEMATOCRIT: 42.2 % (ref 36.0–46.0)
Hemoglobin: 12.7 g/dL (ref 12.0–15.0)
LYMPHS PCT: 47 % — AB (ref 12–46)
Lymphs Abs: 2.5 10*3/uL (ref 0.7–4.0)
MCH: 29 pg (ref 26.0–34.0)
MCHC: 30.1 g/dL (ref 30.0–36.0)
MCV: 96.3 fL (ref 78.0–100.0)
Monocytes Absolute: 0.4 10*3/uL (ref 0.1–1.0)
Monocytes Relative: 8 % (ref 3–12)
NEUTROS PCT: 42 % — AB (ref 43–77)
Neutro Abs: 2.2 10*3/uL (ref 1.7–7.7)
PLATELETS: 101 10*3/uL — AB (ref 150–400)
RBC: 4.38 MIL/uL (ref 3.87–5.11)
RDW: 14.6 % (ref 11.5–15.5)
WBC: 5.3 10*3/uL (ref 4.0–10.5)

## 2015-06-01 LAB — BLOOD GAS, ARTERIAL
Acid-Base Excess: 1 mmol/L (ref 0.0–2.0)
BICARBONATE: 24.9 meq/L — AB (ref 20.0–24.0)
DRAWN BY: 331471
O2 Saturation: 92.5 %
PH ART: 7.418 (ref 7.350–7.450)
Patient temperature: 98.6
TCO2: 22.2 mmol/L (ref 0–100)
pCO2 arterial: 39.3 mmHg (ref 35.0–45.0)
pO2, Arterial: 64.7 mmHg — ABNORMAL LOW (ref 80.0–100.0)

## 2015-06-01 LAB — URINALYSIS, ROUTINE W REFLEX MICROSCOPIC
Bilirubin Urine: NEGATIVE
GLUCOSE, UA: NEGATIVE mg/dL
HGB URINE DIPSTICK: NEGATIVE
Ketones, ur: NEGATIVE mg/dL
Leukocytes, UA: NEGATIVE
NITRITE: NEGATIVE
PH: 5 (ref 5.0–8.0)
Protein, ur: NEGATIVE mg/dL
SPECIFIC GRAVITY, URINE: 1.019 (ref 1.005–1.030)
Urobilinogen, UA: 0.2 mg/dL (ref 0.0–1.0)

## 2015-06-01 LAB — COMPREHENSIVE METABOLIC PANEL
ALBUMIN: 3.6 g/dL (ref 3.5–5.0)
ALT: 41 U/L (ref 14–54)
AST: 30 U/L (ref 15–41)
Alkaline Phosphatase: 133 U/L — ABNORMAL HIGH (ref 38–126)
BILIRUBIN TOTAL: 0.7 mg/dL (ref 0.3–1.2)
BUN: 19 mg/dL (ref 6–20)
CO2: 26 mmol/L (ref 22–32)
Calcium: 9.4 mg/dL (ref 8.9–10.3)
Chloride: >130 mmol/L (ref 101–111)
Creatinine, Ser: 0.89 mg/dL (ref 0.44–1.00)
GFR calc non Af Amer: >60 mL/min (ref >60)
Glucose, Bld: 95 mg/dL (ref 65–99)
Potassium: 3.5 mmol/L (ref 3.5–5.1)
Sodium: 171 mmol/L (ref 135–145)
Total Protein: 7.2 g/dL (ref 6.5–8.1)

## 2015-06-01 LAB — MRSA PCR SCREENING: MRSA BY PCR: POSITIVE — AB

## 2015-06-01 LAB — PHENYTOIN LEVEL, TOTAL: PHENYTOIN LVL: 2.5 ug/mL — AB (ref 10.0–20.0)

## 2015-06-01 MED ORDER — VITAMIN D3 25 MCG (1000 UNIT) PO TABS
5000.0000 [IU] | ORAL_TABLET | Freq: Every day | ORAL | Status: DC
  Administered 2015-06-03: 5000 [IU] via ORAL
  Filled 2015-06-01 (×4): qty 5

## 2015-06-01 MED ORDER — MINOXIDIL 10 MG PO TABS
10.0000 mg | ORAL_TABLET | Freq: Two times a day (BID) | ORAL | Status: DC
  Administered 2015-06-01: 10 mg via ORAL
  Filled 2015-06-01 (×3): qty 1

## 2015-06-01 MED ORDER — TRAZODONE HCL 50 MG PO TABS
25.0000 mg | ORAL_TABLET | Freq: Every evening | ORAL | Status: DC | PRN
  Administered 2015-06-03 – 2015-06-05 (×3): 25 mg via ORAL
  Filled 2015-06-01 (×3): qty 1

## 2015-06-01 MED ORDER — FAMOTIDINE 40 MG/5ML PO SUSR: 20.0000 mg | Freq: Two times a day (BID) | ORAL | Status: DC

## 2015-06-01 MED ORDER — OXYCODONE HCL 5 MG PO TABS
5.0000 mg | ORAL_TABLET | ORAL | Status: DC | PRN
  Administered 2015-06-03: 5 mg via ORAL
  Filled 2015-06-01: qty 1

## 2015-06-01 MED ORDER — ENOXAPARIN SODIUM 40 MG/0.4ML ~~LOC~~ SOLN
40.0000 mg | SUBCUTANEOUS | Status: DC
  Administered 2015-06-01 – 2015-06-05 (×5): 40 mg via SUBCUTANEOUS
  Filled 2015-06-01 (×5): qty 0.4

## 2015-06-01 MED ORDER — FAMOTIDINE 20 MG PO TABS
20.0000 mg | ORAL_TABLET | Freq: Two times a day (BID) | ORAL | Status: DC
  Administered 2015-06-01 – 2015-06-02 (×2): 20 mg via ORAL
  Filled 2015-06-01 (×4): qty 1

## 2015-06-01 MED ORDER — HYDROMORPHONE HCL 1 MG/ML IJ SOLN: 0.5000 mg | INTRAMUSCULAR | Status: DC | PRN

## 2015-06-01 MED ORDER — ADULT MULTIVITAMIN W/MINERALS CH
1.0000 | ORAL_TABLET | Freq: Every day | ORAL | Status: DC
  Administered 2015-06-03: 1 via ORAL
  Filled 2015-06-01 (×2): qty 1

## 2015-06-01 MED ORDER — DEXTROSE 5 % IV SOLN
INTRAVENOUS | Status: DC
  Administered 2015-06-01 – 2015-06-02 (×2): 1000 mL via INTRAVENOUS

## 2015-06-01 MED ORDER — LACOSAMIDE 50 MG PO TABS
100.0000 mg | ORAL_TABLET | Freq: Two times a day (BID) | ORAL | Status: DC
  Administered 2015-06-01 – 2015-06-02 (×3): 100 mg via ORAL
  Filled 2015-06-01 (×3): qty 2

## 2015-06-01 MED ORDER — ACETAMINOPHEN 325 MG PO TABS
650.0000 mg | ORAL_TABLET | Freq: Four times a day (QID) | ORAL | Status: DC | PRN
  Administered 2015-06-03 – 2015-06-05 (×4): 650 mg via ORAL
  Filled 2015-06-01 (×4): qty 2

## 2015-06-01 MED ORDER — ONDANSETRON HCL 4 MG/2ML IJ SOLN
4.0000 mg | Freq: Four times a day (QID) | INTRAMUSCULAR | Status: DC | PRN
  Administered 2015-06-05: 4 mg via INTRAVENOUS
  Filled 2015-06-01: qty 2

## 2015-06-01 MED ORDER — METOPROLOL TARTRATE 25 MG PO TABS
100.0000 mg | ORAL_TABLET | Freq: Two times a day (BID) | ORAL | Status: DC
  Administered 2015-06-01: 100 mg via ORAL
  Filled 2015-06-01: qty 4

## 2015-06-01 MED ORDER — ONDANSETRON HCL 4 MG PO TABS
4.0000 mg | ORAL_TABLET | Freq: Four times a day (QID) | ORAL | Status: DC | PRN
Start: 2015-06-01 — End: 2015-06-06

## 2015-06-01 MED ORDER — DEXTROSE 5 % IV SOLN
Freq: Once | INTRAVENOUS | Status: AC
  Administered 2015-06-01: 19:00:00 via INTRAVENOUS

## 2015-06-01 MED ORDER — ACETAMINOPHEN 650 MG RE SUPP: 650.0000 mg | Freq: Four times a day (QID) | RECTAL | Status: DC | PRN

## 2015-06-01 MED ORDER — CLONIDINE HCL 0.1 MG PO TABS
0.2000 mg | ORAL_TABLET | Freq: Three times a day (TID) | ORAL | Status: DC
  Administered 2015-06-01: 0.2 mg via ORAL
  Filled 2015-06-01: qty 2

## 2015-06-01 MED ORDER — PHENYTOIN SODIUM EXTENDED 100 MG PO CAPS
200.0000 mg | ORAL_CAPSULE | Freq: Two times a day (BID) | ORAL | Status: DC
  Administered 2015-06-01 – 2015-06-03 (×4): 200 mg via ORAL
  Filled 2015-06-01 (×4): qty 2

## 2015-06-01 MED ORDER — ALUM & MAG HYDROXIDE-SIMETH 200-200-20 MG/5ML PO SUSP: 30.0000 mL | Freq: Four times a day (QID) | ORAL | Status: DC | PRN

## 2015-06-01 MED ORDER — CHOLESTYRAMINE 4 G PO PACK
4.0000 g | PACK | Freq: Three times a day (TID) | ORAL | Status: DC
  Administered 2015-06-01 – 2015-06-02 (×3): 4 g via ORAL
  Filled 2015-06-01 (×7): qty 1

## 2015-06-01 MED ORDER — LEVETIRACETAM 750 MG PO TABS
1500.0000 mg | ORAL_TABLET | Freq: Two times a day (BID) | ORAL | Status: DC
  Administered 2015-06-01 – 2015-06-02 (×3): 1500 mg via ORAL
  Filled 2015-06-01 (×3): qty 2
  Filled 2015-06-01: qty 3

## 2015-06-01 MED ORDER — ENSURE ENLIVE PO LIQD
237.0000 mL | Freq: Two times a day (BID) | ORAL | Status: DC
  Administered 2015-06-03 – 2015-06-05 (×5): 237 mL via ORAL

## 2015-06-01 MED ORDER — VITAMIN C 500 MG PO TABS
1000.0000 mg | ORAL_TABLET | Freq: Every day | ORAL | Status: DC
  Filled 2015-06-01: qty 2

## 2015-06-01 NOTE — ED Notes (Addendum)
Spoke to [place], Transport planner, second attempt to call report. Rn sts not ready because she needed to go see another patient. Also reported that charge nurse unable to get report because she getting a patient. Stanford Breed, ED charge nurse made aware.

## 2015-06-01 NOTE — ED Notes (Signed)
Nurse will do pt lab

## 2015-06-01 NOTE — ED Notes (Signed)
Per ems pt from [facility], per staff pt has altered loc , yet when upon ems arrival, pt went back to "[facility]" per staff. Family requested pt evaluation. Pt is non verbal and tetraplegic due to stroke in past. Pt is alert and oriented to pt's loc  baseline.

## 2015-06-01 NOTE — H&P (Addendum)
[facility] Admission History and Physical       [name] ZOX:[id number] DOB: 1966-11-05 DOA: 06/01/2015  Referring physician: EDP PCP: Dr. Nelson Chimes of [facility] Specialists:   Chief Complaint: Increased Drowsiness  HPI: [name] is a 49 y.o. female Nursing Home Resident with a history of SAH, Seizure  Disorder, HTN who was sent to the ED with observed changes in her behavior.  She was less responsive than her usual and reported by her family as being drowsy.   Per report of facility she had not been eating or drinking well for the past 2 days.  In the ED a CT scan of the head was performed and was negative for acute findings.   She was also found to have hypernatremia to 171 with hyperchloremia of > 130.   She was referred for medical admission.     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    She is bedbound and total care and has to be fed.   Her Diet is a Pureed Diet with Thickened Liquids.        Review of Systems: Unable to Obtain from Patient  Past Medical History  Diagnosis Date  . Hypertension   . SAH (subarachnoid hemorrhage)   . Seizure   . Stroke   . Tracheostomy status   . Tracheomalacia   . DVT (deep venous thrombosis)      Past Surgical History  Procedure Laterality Date  . Abdominal hysterectomy    . Cholecystectomy        Prior to Admission medications   Medication Sig Start Date End Date Taking? Authorizing Provider  acetaminophen (TYLENOL) 325 MG tablet Take 2 tablets (650 mg total) by mouth daily. Patient taking differently: Take 650 mg by mouth daily as needed for moderate pain. 2 tablets given every day, may take additional tablets PRN for pain 03/22/15  Yes Hollice Espy, MD  Ascorbic Acid (VITAMIN C) 1000 MG tablet Take 1,000 mg by mouth daily.   Yes Historical Provider, MD  carboxymethylcellulose (REFRESH PLUS) 0.5 % SOLN Place 2 drops into  both eyes 3 (three) times daily.   Yes Historical Provider, MD  Cholecalciferol (VITAMIN D3) 5000 UNITS TABS Take 1 tablet by mouth daily with breakfast.   Yes Historical Provider, MD  cholestyramine (QUESTRAN) 4 G packet Take 1 packet (4 g total) by mouth 3 (three) times daily. 03/22/15  Yes Hollice Espy, MD  cloNIDine (CATAPRES) 0.2 MG tablet Take 1 tablet (0.2 mg total) by mouth 3 (three) times daily. 04/07/14  Yes Jeanella Craze, NP  famotidine (PEPCID) 40 MG/5ML suspension Take 2.5 mLs (20 mg total) by mouth every 12 (twelve) hours. 03/22/15  Yes Hollice Espy, MD  lacosamide (VIMPAT) 50 MG TABS tablet Give 100 mg by tube 2 (two) times daily.    Yes Historical Provider, MD  levETIRAcetam (KEPPRA) 750 MG tablet Take 1,500 mg by mouth 2 (two) times daily.   Yes Historical Provider, MD  meloxicam (MOBIC) 7.5 MG tablet Take 7.5 mg by mouth daily.   Yes Historical Provider, MD  metoprolol (LOPRESSOR) 100 MG tablet Take 1 tablet (100 mg total) by mouth 2 (two) times daily. 03/22/15  Yes Hollice Espy, MD  minoxidil (LONITEN) 10 MG tablet Place 1 tablet (10 mg total) into feeding tube 2 (two) times daily. Patient taking differently: Take 10 mg by mouth 2 (two) times daily.  04/07/14  Yes Jeanella Craze, NP  Multiple Vitamin (MULTIVITAMIN WITH MINERALS) TABS tablet Take 1 tablet by mouth daily with breakfast.   Yes Historical Provider, MD  phenytoin (DILANTIN) 100 MG ER capsule Take 200 mg by mouth 2 (two) times daily.   Yes Historical Provider, MD  traZODone (DESYREL) 50 MG tablet Take 25 mg by mouth at bedtime as needed for sleep.    Yes Historical Provider, MD     Allergies  Allergen Reactions  . Amlodipine Swelling  . Penicillins Swelling  . Latex Rash    Unknown   . Other Other (See Comments)    Natural Rubber- Unknown     Social History:  reports that she has quit smoking. Her smoking use included Cigarettes. She has never used smokeless tobacco. She reports that she does not  drink alcohol or use illicit drugs.    Family History  Problem Relation Age of Onset  . Heart disease Mother 58    MI       Physical Exam:  GEN:  Pleasant Obese Bedbound 49 y.o. African American female examined and in no acute distress; cooperative with exam Filed Vitals:   06/01/15 1335 06/01/15 1547  BP: 97/65 108/77  Pulse: 82 80  Temp: 99 F (37.2 C) 99.3 F (37.4 C)  TempSrc: Oral Rectal  Resp: 15 26  SpO2: 99% 94%   Blood pressure 108/77, pulse 80, temperature 99.3 F (37.4 C), temperature source Rectal, resp. rate 26, SpO2 94 %. PSYCH: She is alert  does not appear anxious does not appear depressed; affect is normal HEENT: Normocephalic and Atraumatic, Mucous membranes pink; PERRLA; EOM intact; Fundi:  Benign;  No scleral icterus, Nares: Patent, Oropharynx: Clear,  Unable to Visulaize due to a lack of Cooperation,    Neck:  FROM, No Cervical Lymphadenopathy nor Thyromegaly or Carotid Bruit; No JVD; Breasts:: Not examined CHEST WALL: No tenderness CHEST: Normal respiration, clear to auscultation bilaterally HEART: Regular rate and rhythm; no murmurs rubs or gallops BACK: No kyphosis or scoliosis; No CVA tenderness ABDOMEN: Positive Bowel Sounds, Obese, Soft Non-Tender, No Rebound or Guarding; No Masses, No Organomegaly, No Pannus; No Intertriginous candida. Rectal Exam: Not done EXTREMITIES: No Cyanosis, Clubbing, or Edema; No Ulcerations. Genitalia: not examined PULSES: 2+ and symmetric SKIN: Normal hydration no rash or ulceration CNS:  Alert  And Awake, Unable to Ascertain Orientation,  Right and Left Hemiplegia Vascular: pulses palpable throughout    Labs on Admission:  Basic Metabolic Panel:  Recent Labs Lab 06/01/15 1703  NA 171*  K 3.5  CL >130*  CO2 26  GLUCOSE 95  BUN 19  CREATININE 0.89  CALCIUM 9.4   Liver Function Tests:  Recent Labs Lab 06/01/15 1703  AST 30  ALT 41  ALKPHOS 133*  BILITOT 0.7  PROT 7.2  ALBUMIN 3.6   No results  for input(s): LIPASE, AMYLASE in the last 168 hours. No results for input(s): AMMONIA in the last 168 hours. CBC:  Recent Labs Lab 06/01/15 1703  WBC 5.3  NEUTROABS 2.2  HGB 12.7  HCT 42.2  MCV 96.3  PLT 101*   Cardiac Enzymes: No results for input(s): CKTOTAL, CKMB, CKMBINDEX, TROPONINI in the last 168 hours.  BNP (last 3 results)  Recent Labs  03/18/15 1240  BNP 69.7    ProBNP (last 3 results) No results for input(s): PROBNP in the last 8760 hours.  CBG: No results for input(s): GLUCAP in the last 168 hours.  Radiological Exams on Admission:  Ct Head Wo Contrast  06/01/2015   CLINICAL DATA:  Altered level of consciousness.  EXAM: CT HEAD WITHOUT CONTRAST  TECHNIQUE: Contiguous axial images were obtained from the base of the skull through the vertex without intravenous contrast.  COMPARISON:  Mar 16, 2015  FINDINGS: There is persistent, extensive ex vacuo phenomenon in both frontal lobes with mild residua of prior hemorrhage in these areas, stable. The encephalomalacia extends along the medial frontal lobes to the vertex and extends posteriorly into the medial proximal parietal lobes along the most superior aspects. Prominence of the lateral and third ventricles is a stable finding. There is a stable aneurysm clip just inferior and anterior to the third ventricle, slightly toward the left, stable. There is no mass or extra-axial fluid. No acute hemorrhage evident. No new gray-white compartment lesion. No acute infarct. Patchy small vessel disease in the centra semiovale is stable.  There is postoperative change with evidence of left frontal craniotomy, stable. No new bone lesions. Mastoid air cells are clear.  IMPRESSION: Stable encephalomalacia and bilateral frontal infarcts, noted previously. Infarct extends into the anterior left temporal lobe, stable. More superiorly, infarction extends into the medial anterior parietal lobes. There is no new gray-white compartment lesion. No  acute infarct evident. No acute hemorrhage or mass effect. Prominence of the lateral and third ventricles is stable. Evidence of residua of old hemorrhage within the areas of encephalomalacia, stable. Aneurysm clip present slightly inferior to the third ventricle.   Electronically Signed   By: Bretta Bang III M.D.   On: 06/01/2015 16:41   Dg Chest Port 1 View  06/01/2015   CLINICAL DATA:  Altered mental status.  EXAM: PORTABLE CHEST - 1 VIEW  COMPARISON:  Mar 18, 2015.  FINDINGS: The heart size and mediastinal contours are within normal limits. Both lungs are clear. No pneumothorax or pleural effusion is noted. Hypoinflation of the lungs is noted. The visualized skeletal structures are unremarkable.  IMPRESSION: No acute cardiopulmonary abnormality seen.   Electronically Signed   By: Lupita Raider, M.D.   On: 06/01/2015 15:47     EKG: Independently reviewed.    Assessment/Plan:   49 y.o. female with  Principal Problem:   1.   Hypernatremia- due to Poor Intake of Fluids, and medication effects   IVFs with D5W,      Calculated Free Water Deficit- 8.067 liters     Active Problems:   2.    Acute Encephalopathy- due to Hypernatremia   Monitor     3.   HTN (hypertension)   Continue Clonidine, Metoprolol, and Minoxidil as BP tolerates      4.   Seizures/  Localization-related symptomatic epilepsy and epileptic syndromes with complex partial seizures, not intractable, without status epilepticus   Contiue Keppra, Vimpat, and Phenytoin     6.   Hx of ischemic right ACA stroke/ and History of ischemic left ACA stroke   Chronic, No acute Changes on Head CT scan     7.   DVT Prophylaxis   Lovenox           Code Status:     FULL CODE        Family Communication:   Family at Bedside   Disposition Plan:    Inpatient Status        Time spent:  44  Minutes      Ron Parker [facility] Pager [phone]   If 7AM -7PM Please Contact the Day Rounding Team MD for  [facility]  If 7PM-7AM, Please Contact Night-Floor Coverage  [email or web address] Password [email or web address] 06/01/2015, 7:15 PM     ADDENDUM:   Patient was seen and examined on 06/01/2015

## 2015-06-01 NOTE — ED Provider Notes (Signed)
CSN: [id number]     Arrival date & time 06/01/15  1329 History   First MD Initiated Contact with Patient 06/01/15 1424     Chief Complaint  Patient presents with  . Altered Mental Status     (Consider location/radiation/quality/duration/timing/severity/associated sxs/prior Treatment) HPI  49 year old female presents with weakness and lethargy. Patient is unable to provide a history and history is taken from the nursing staff as well as the sister. Patient was normal 2 days ago for her. However over the last 48 hours she has been more lethargic. She is eating significantly less and did not eat at all today. This reminds the sister of the time where she had problems with her sodium. The patient has not had any known falls or head injury. The patient has not been febrile at the facility. No known seizures in the last few months since having her seizure medicine adjusted. Patient is awake but seems like she needs a nap per the sister.  Past Medical History  Diagnosis Date  . Hypertension   . SAH (subarachnoid hemorrhage)   . Seizure   . Stroke   . Tracheostomy status   . Tracheomalacia   . DVT (deep venous thrombosis)    Past Surgical History  Procedure Laterality Date  . Abdominal hysterectomy    . Cholecystectomy     Family History  Problem Relation Age of Onset  . Heart disease Mother 3    MI   History  Substance Use Topics  . Smoking status: Former Smoker    Types: Cigarettes  . Smokeless tobacco: Never Used     Comment: trying to quit-2 cigarettes daily  . Alcohol Use: No     Comment: Very seldom   OB History    No data available     Review of Systems  Unable to perform ROS: Patient nonverbal      Allergies  Amlodipine; Penicillins; Latex; and Other  Home Medications   Prior to Admission medications   Medication Sig Start Date End Date Taking? Authorizing Provider  acetaminophen (TYLENOL) 325 MG tablet Take 2 tablets (650 mg total) by mouth daily. Patient  taking differently: Take 650 mg by mouth daily as needed for moderate pain. 2 tablets given every day, may take additional tablets PRN for pain 03/22/15  Yes Hollice Espy, MD  Ascorbic Acid (VITAMIN C) 1000 MG tablet Take 1,000 mg by mouth daily.   Yes Historical Provider, MD  carboxymethylcellulose (REFRESH PLUS) 0.5 % SOLN Place 2 drops into both eyes 3 (three) times daily.   Yes Historical Provider, MD  Cholecalciferol (VITAMIN D3) 5000 UNITS TABS Take 1 tablet by mouth daily with breakfast.   Yes Historical Provider, MD  cholestyramine (QUESTRAN) 4 G packet Take 1 packet (4 g total) by mouth 3 (three) times daily. 03/22/15  Yes Hollice Espy, MD  cloNIDine (CATAPRES) 0.2 MG tablet Take 1 tablet (0.2 mg total) by mouth 3 (three) times daily. 04/07/14  Yes Jeanella Craze, NP  famotidine (PEPCID) 40 MG/5ML suspension Take 2.5 mLs (20 mg total) by mouth every 12 (twelve) hours. 03/22/15  Yes Hollice Espy, MD  lacosamide (VIMPAT) 50 MG TABS tablet Give 100 mg by tube 2 (two) times daily.    Yes Historical Provider, MD  levETIRAcetam (KEPPRA) 750 MG tablet Take 1,500 mg by mouth 2 (two) times daily.   Yes Historical Provider, MD  meloxicam (MOBIC) 7.5 MG tablet Take 7.5 mg by mouth daily.   Yes Historical Provider,  MD  metoprolol (LOPRESSOR) 100 MG tablet Take 1 tablet (100 mg total) by mouth 2 (two) times daily. 03/22/15  Yes Hollice Espy, MD  minoxidil (LONITEN) 10 MG tablet Place 1 tablet (10 mg total) into feeding tube 2 (two) times daily. Patient taking differently: Take 10 mg by mouth 2 (two) times daily.  04/07/14  Yes Jeanella Craze, NP  Multiple Vitamin (MULTIVITAMIN WITH MINERALS) TABS tablet Take 1 tablet by mouth daily with breakfast.   Yes Historical Provider, MD  phenytoin (DILANTIN) 100 MG ER capsule Take 200 mg by mouth 2 (two) times daily.   Yes Historical Provider, MD  traZODone (DESYREL) 50 MG tablet Take 25 mg by mouth at bedtime as needed for sleep.    Yes Historical  Provider, MD   BP 108/77 mmHg  Pulse 80  Temp(Src) 99.3 F (37.4 C) (Rectal)  Resp 26  SpO2 94% Physical Exam  Constitutional: She appears well-developed and well-nourished. She appears lethargic.  HENT:  Head: Normocephalic and atraumatic.  Right Ear: External ear normal.  Left Ear: External ear normal.  Nose: Nose normal.  Eyes: Right eye exhibits no discharge. Left eye exhibits no discharge.  Neck: Neck supple.  Cardiovascular: Normal rate, regular rhythm and normal heart sounds.   Pulmonary/Chest: Effort normal and breath sounds normal. Tachypnea noted.  Abdominal: Soft. There is no tenderness.  Neurological: She appears lethargic.  Patient is awake and looks at me. Shakes head up and down to almost all questions I ask of her. Does not move her extremities on command  Skin: Skin is warm and dry.  Nursing note and vitals reviewed.   ED Course  Procedures (including critical care time) Labs Review Labs Reviewed  MRSA PCR SCREENING - Abnormal; Notable for the following:    MRSA by PCR POSITIVE (*)    All other components within normal limits  COMPREHENSIVE METABOLIC PANEL - Abnormal; Notable for the following:    Sodium 171 (*)    Chloride >130 (*)    Alkaline Phosphatase 133 (*)    All other components within normal limits  CBC WITH DIFFERENTIAL/PLATELET - Abnormal; Notable for the following:    Platelets 101 (*)    Neutrophils Relative % 42 (*)    Lymphocytes Relative 47 (*)    All other components within normal limits  URINALYSIS, ROUTINE W REFLEX MICROSCOPIC (NOT AT [facility]) - Abnormal; Notable for the following:    APPearance CLOUDY (*)    All other components within normal limits  PHENYTOIN LEVEL, TOTAL - Abnormal; Notable for the following:    Phenytoin Lvl 2.5 (*)    All other components within normal limits  BLOOD GAS, ARTERIAL - Abnormal; Notable for the following:    pO2, Arterial 64.7 (*)    Bicarbonate 24.9 (*)    All other components within normal  limits  BASIC METABOLIC PANEL  CBC    Imaging Review Dg Chest Port 1 View  06/01/2015   CLINICAL DATA:  Altered mental status.  EXAM: PORTABLE CHEST - 1 VIEW  COMPARISON:  Mar 18, 2015.  FINDINGS: The heart size and mediastinal contours are within normal limits. Both lungs are clear. No pneumothorax or pleural effusion is noted. Hypoinflation of the lungs is noted. The visualized skeletal structures are unremarkable.  IMPRESSION: No acute cardiopulmonary abnormality seen.   Electronically Signed   By: Lupita Raider, M.D.   On: 06/01/2015 15:47     EKG Interpretation None      MDM  Final diagnoses:  Hypernatremia    Patient is awake and at her mental baseline per family except sleepy appearing. Patient does not respond appropriately to commands or questions and thus it is difficult to get an accurate mental status or neuro exam. She does have significant hypernatremia that is likely due to dehydration given the nearly doubling in creatinine (0.4-->0.8) with increased BUN. Given patient is not hypotensive or do not feel she needs boluses at this time. After discussion with hospitalist, Dr. Lovell Sheehan, will start on D5 water at 100 mL per hour. Patient to be admitted to the stepdown unit given critical hypernatremia.    Pricilla Loveless, MD 06/01/15 [phone]

## 2015-06-01 NOTE — ED Provider Notes (Signed)
MSE was initiated and I personally evaluated the patient and placed orders (if any) at  3:08 PM on June 01, 2015.  The patient appears stable so that the remainder of the MSE may be completed by another provider. Patient presents via EMS from [facility]. Per nursing staff at the facility patient experienced a fleeting altered level of consciousness, but then returned back to baseline. Per nursing, facility declined any need for patient evaluation, but family requests patient come to ED for evaluation. Patient is nonverbal and quadriplegic due to previous stroke. Per nursing Facility patient is at baseline. Per nursing facility, patient has not been eating for the past 2 days and has been more weak. Nursing facility cannot confirm the patient is able to communicate, states the patient "always nods her head". Will order basic labs, Chest x-ray. Vital signs are stable, normal cardiac, lung and abdominal exams. Patient is stable for further evaluation by oncoming provider.  Joycie Peek, PA-C 06/01/15 1528  Richardean Canal, MD 06/01/15 [phone]

## 2015-06-01 NOTE — ED Notes (Signed)
Bed: ON62 Expected date:  Expected time:  Means of arrival:  Comments: 47 ams

## 2015-06-01 NOTE — ED Notes (Signed)
Sister contact number Kitt Minardi [phone]

## 2015-06-01 NOTE — ED Notes (Signed)
Attempted to call report , receiving nurse unavailable. Was told nurses are in shift change reports.

## 2015-06-02 DIAGNOSIS — I10 Essential (primary) hypertension: Secondary | ICD-10-CM

## 2015-06-02 DIAGNOSIS — E870 Hyperosmolality and hypernatremia: Principal | ICD-10-CM

## 2015-06-02 DIAGNOSIS — L8990 Pressure ulcer of unspecified site, unspecified stage: Secondary | ICD-10-CM

## 2015-06-02 DIAGNOSIS — Z8673 Personal history of transient ischemic attack (TIA), and cerebral infarction without residual deficits: Secondary | ICD-10-CM

## 2015-06-02 DIAGNOSIS — G9340 Encephalopathy, unspecified: Secondary | ICD-10-CM

## 2015-06-02 LAB — CBC
HEMATOCRIT: 42.2 % (ref 36.0–46.0)
Hemoglobin: 12.8 g/dL (ref 12.0–15.0)
MCH: 29.8 pg (ref 26.0–34.0)
MCHC: 30.3 g/dL (ref 30.0–36.0)
MCV: 98.4 fL (ref 78.0–100.0)
PLATELETS: 101 10*3/uL — AB (ref 150–400)
RBC: 4.29 MIL/uL (ref 3.87–5.11)
RDW: 14.8 % (ref 11.5–15.5)
WBC: 4.8 10*3/uL (ref 4.0–10.5)

## 2015-06-02 LAB — BASIC METABOLIC PANEL
Anion gap: 7 (ref 5–15)
Anion gap: 10 (ref 5–15)
BUN: 18 mg/dL (ref 6–20)
BUN: 17 mg/dL (ref 6–20)
BUN: 15 mg/dL (ref 6–20)
CHLORIDE: 129 mmol/L — AB (ref 101–111)
CO2: 27 mmol/L (ref 22–32)
CO2: 28 mmol/L (ref 22–32)
CO2: 24 mmol/L (ref 22–32)
CREATININE: 0.75 mg/dL (ref 0.44–1.00)
CREATININE: 0.69 mg/dL (ref 0.44–1.00)
Calcium: 9.4 mg/dL (ref 8.9–10.3)
Calcium: 9.2 mg/dL (ref 8.9–10.3)
Calcium: 9.6 mg/dL (ref 8.9–10.3)
Chloride: >130 mmol/L (ref 101–111)
Chloride: 128 mmol/L — ABNORMAL HIGH (ref 101–111)
Creatinine, Ser: 0.69 mg/dL (ref 0.44–1.00)
GFR calc non Af Amer: >60 mL/min (ref >60)
GFR calc non Af Amer: >60 mL/min (ref >60)
GLUCOSE: 121 mg/dL — AB (ref 65–99)
GLUCOSE: 123 mg/dL — AB (ref 65–99)
GLUCOSE: 131 mg/dL — AB (ref 65–99)
POTASSIUM: 3.1 mmol/L — AB (ref 3.5–5.1)
Potassium: 3.4 mmol/L — ABNORMAL LOW (ref 3.5–5.1)
Potassium: 3.1 mmol/L — ABNORMAL LOW (ref 3.5–5.1)
SODIUM: 164 mmol/L — AB (ref 135–145)
SODIUM: 162 mmol/L — AB (ref 135–145)
Sodium: 168 mmol/L (ref 135–145)

## 2015-06-02 MED ORDER — KCL IN DEXTROSE-NACL 20-5-0.45 MEQ/L-%-% IV SOLN: INTRAVENOUS | Status: DC

## 2015-06-02 MED ORDER — SODIUM CHLORIDE 0.9 % IV BOLUS (SEPSIS): 1000.0000 mL | Freq: Once | INTRAVENOUS | Status: DC

## 2015-06-02 MED ORDER — POTASSIUM CHLORIDE IN NACL 20-0.45 MEQ/L-% IV SOLN: INTRAVENOUS | Status: DC

## 2015-06-02 MED ORDER — POTASSIUM CL IN DEXTROSE 5% 20 MEQ/L IV SOLN
20.0000 meq | INTRAVENOUS | Status: DC
  Administered 2015-06-02 – 2015-06-03 (×2): 20 meq via INTRAVENOUS
  Filled 2015-06-02 (×3): qty 1000

## 2015-06-02 MED ORDER — POTASSIUM CHLORIDE 10 MEQ/100ML IV SOLN
10.0000 meq | INTRAVENOUS | Status: AC
Start: 2015-06-02 — End: 2015-06-02
  Administered 2015-06-02 (×2): 10 meq via INTRAVENOUS
  Filled 2015-06-02 (×2): qty 100

## 2015-06-02 NOTE — Plan of Care (Signed)
Problem: Phase I Progression Outcomes Goal: OOB as tolerated unless otherwise ordered Outcome: Not Applicable Date Met:  76/28/31 quadriplegic

## 2015-06-02 NOTE — Evaluation (Signed)
Clinical/Bedside Swallow Evaluation Patient Details  Name: [name] MRN: [id number] Date of Birth: 06-15-1966  Today's Date: 06/02/2015 Time: SLP Start Time (ACUTE ONLY): 1328 SLP Stop Time (ACUTE ONLY): 1348 SLP Time Calculation (min) (ACUTE ONLY): 20 min  Past Medical History:  Past Medical History  Diagnosis Date  . Hypertension   . SAH (subarachnoid hemorrhage)   . Seizure   . Stroke   . Tracheostomy status   . Tracheomalacia   . DVT (deep venous thrombosis)    Past Surgical History:  Past Surgical History  Procedure Laterality Date  . Abdominal hysterectomy    . Cholecystectomy     HPI:  [name] is a 49 y.o. female with complicated medical history. She was hospitalized at [facility] because of front headache which was attributed to an aneurysm. 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 She required a more than two month hospitalization with tracheostomy and PEG tube placement and was eventually discharged to SNF with guarded prognosis, inability to communicate or follow commands and quadriplegia. She has been at SNF ever since except for a hospitalization for tracheitis in 04/2014 and for hypernatremia and C. Diff diarrhea in 03/2015. She was brought to the hospital for increased drowsiness and decreased PO intake. In the ED a CT scan of the head was performed and was negative for acute findings. She was also found to have hypernatremia to 171 with hyperchloremia of > 130.Most recent MBS in  09/2014 recommended Dys 3 diet and nectar thick liquids. She has been on Dys 1 diet and nectar thick liquids at SNF per MD note, however has had difficulty maintaining hydration and son is now requesting consideration of thin liquids.   Assessment / Plan / Recommendation Clinical Impression  Pt has oral holding and piecemeal swallowing, requiring Min-Mod cues to clear.  While no overt coughing or throat clearing is observed, thin liquid trials elicit a red face and disphragmatic movement suggestive of potential need for reflexive cough. Vocal quality cannot be assessed at this time. Recommend to continue with diet from PTA, dysphagia 1 and nectar thick liquids, but with repeat MBS to determine pt's readiness to advance.     Aspiration Risk  Mild    Diet Recommendation Dysphagia 1 (Puree);Nectar   Medication Administration: Crushed with puree Compensations: Slow rate;Small sips/bites;Check for pocketing    Other  Recommendations Oral Care Recommendations: Oral care BID Other Recommendations: Order thickener from pharmacy;Prohibited food (jello, ice cream, thin soups);Remove water pitcher   Follow Up Recommendations       Frequency and Duration        Pertinent Vitals/Pain n/a    SLP Swallow Goals     Swallow Study Prior Functional Status       General Other Pertinent Information: [name] is a 49 y.o. female with complicated medical history. She was hospitalized at [facility] because of front headache which was attributed to an aneurysm. 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 She required a more than two month hospitalization with tracheostomy and PEG tube placement and was eventually discharged to SNF with guarded prognosis, inability to communicate or follow commands and quadriplegia. She has been at SNF ever since except for a hospitalization for tracheitis in 04/2014 and for hypernatremia and C. Diff diarrhea in 03/2015. She was brought to the hospital for increased drowsiness and decreased PO  intake. In the ED a CT scan of the head was performed and was negative for acute findings. She was also found to have hypernatremia to 171 with hyperchloremia of > 130.Most recent MBS in  09/2014 recommended Dys 3 diet and nectar thick liquids.  She has been on Dys 1 diet and nectar thick liquids at SNF per MD note, however has had difficulty maintaining hydration and son is now requesting consideration of thin liquids. Type of Study: Bedside swallow evaluation Previous Swallow Assessment: see HPI Diet Prior to this Study: Dysphagia 1 (puree);Thin liquids Temperature Spikes Noted: Yes (99.3) Respiratory Status: Room air History of Recent Intubation: No Behavior/Cognition: Alert;Requires cueing Oral Cavity - Dentition: Edentulous Patient Positioning: Upright in bed Baseline Vocal Quality: Other (comment) (UTA)    Oral/Motor/Sensory Function     Ice Chips Ice chips: Not tested   Thin Liquid Thin Liquid: Impaired Presentation: Straw Oral Phase Functional Implications: Oral holding;Other (comment) (piecemeal swallow) Pharyngeal  Phase Impairments: Multiple swallows;Other (comments) (red face)    Nectar Thick Nectar Thick Liquid: Impaired Presentation: Straw Oral phase functional implications: Oral holding;Other (comment) (piecemeal swallow) Pharyngeal Phase Impairments: Suspected delayed Swallow   Honey Thick Honey Thick Liquid: Not tested   Puree Puree: Impaired Presentation: Spoon Oral Phase Functional Implications: Oral holding   Solid    Solid: Not tested      Maxcine Ham, M.A. CCC-SLP [phone]  Valita, Righter 06/02/2015,3:22 PM

## 2015-06-02 NOTE — Progress Notes (Signed)
[facility] PROGRESS NOTE  [name] WUJ:[id number] DOB: [birth year] DOA: 06/01/2015 PCP: Ruthe Mannan, MD  Brief Summary  [name] is a 49 y.o. female with complicated medical history.  She was hospitalized at [facility] because of front headache which was attributed to an aneurysm.  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  She required a more than two month hospitalization with tracheostomy and PEG tube placement and was eventually discharged to SNF with guarded prognosis, inability to communicate or follow commands and quadriplegia.  She has been at SNF ever since except for a hospitalization for tracheitis in 04/2014 and for hypernatremia and C. Diff diarrhea in 03/2015.  She was brought to the hospital for increased drowsiness and decreased PO intake.  In the ED a CT scan of the head was performed and was negative for acute findings. She was also found to have hypernatremia to 171 with hyperchloremia of > 130.   Assessment/Plan  Hypernatremia, second admission in last 2-3 months for the same.  I suspect that she either needs her diet liberalized as she has been given dysphagia 1 with nectar thick liquids OR she needs a feeding tube replaced.  She does not appear particularly dehydrated on exam.   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  At baseline, she can smile, make eye contact, give kisses, but she is nonverbal and quadriplegic and does not always follow commands.  Epilepsy with complex partial seizures, does not appear to be in status epilepticus -  Continue Keppra, Vimpat, and phenytoin  Hypertension, current hypotensive -  D/c clonidine, metoprolol, and minoxidil for now  Chronic thrombocytopenia, likely related to her antiepileptics -  Monitor for bleeding  Stage II sacral decubitus ulcer and a tracking wound on the right lower buttock -  Wound care consultation -  Air mattress  Diet:  Dysphagia 1 with thin liquids Access:  PIV, will attempt to get PICC line or other central line IVF:  Yes Proph:  Lovenox  Code Status: Full code Family Communication: Patient and her son by phone Disposition Plan: Son states that he would like to her to go to [facility] and does not want to go back to her previous skilled nursing facility   Consultants:  None  Procedures:  CT head  Antibiotics:  None   HPI/Subjective:  Patient is nonverbal, closes her eyes very tightly when I try to open them and will not follow commands.    Objective: Filed Vitals:   06/02/15 0200 06/02/15 0331 06/02/15 0400 06/02/15 0600  BP: 89/60  93/70 89/57  Pulse: 73  71 66  Temp:  97.9 F (36.6 C)    TempSrc:  Oral    Resp: 20  20 17   Height:  Weight:      SpO2: 98%  100% 100%    Intake/Output Summary (Last 24 hours) at 06/02/15 1012 Last data filed at 06/02/15 0600  Gross per 24 hour  Intake   1100 ml  Output      0 ml  Net   1100 ml   Filed Weights   06/01/15 2000  Weight: 83.8 kg (184 lb 11.9 oz)   Body mass index is 31.7 kg/(m^2).  Exam:   General:  Adult female, eyes closed, No acute distress  HEENT:  NCAT, MMM  Cardiovascular:  RRR, nl S1, S2 no mrg, 2+ pulses, warm extremities  Respiratory:  CTAB, no increased  WOB  Abdomen:   NABS, soft, NT/ND  MSK:   Normal tone and bulk, no LEE  Neuro:  Grossly intact  Data Reviewed: Basic Metabolic Panel:  Recent Labs Lab 06/01/15 1703 06/02/15 0355  NA 171* 168*  K 3.5 3.4*  CL >130* >130*  CO2 26 27  GLUCOSE 95 121*  BUN 19 18  CREATININE 0.89 0.75  CALCIUM 9.4 9.4   Liver Function Tests:  Recent Labs Lab 06/01/15 1703  AST 30  ALT 41  ALKPHOS 133*  BILITOT 0.7  PROT 7.2  ALBUMIN 3.6   No results for input(s): LIPASE, AMYLASE in the last 168 hours. No results for input(s): AMMONIA in the last 168 hours. CBC:  Recent Labs Lab 06/01/15 1703 06/02/15 0355  WBC 5.3 4.8  NEUTROABS 2.2  --   HGB 12.7 12.8  HCT 42.2 42.2  MCV 96.3 98.4  PLT 101* 101*    Recent Results (from the past 240 hour(s))  MRSA PCR Screening     Status: Abnormal   Collection Time: 06/01/15  8:23 PM  Result Value Ref Range Status   MRSA by PCR POSITIVE (A) NEGATIVE Final    Comment:        The GeneXpert MRSA Assay (FDA approved for NASAL specimens only), is one component of a comprehensive MRSA colonization surveillance program. It is not intended to diagnose MRSA infection nor to guide or monitor treatment for MRSA infections. RESULT CALLED TO, READ BACK BY AND VERIFIED WITH: Joslyn Devon RN @ 2242 ON 06/01/15 BY C DAVIS      Studies: Ct Head Wo Contrast  06/01/2015   CLINICAL DATA:  Altered level of consciousness.  EXAM: CT HEAD WITHOUT CONTRAST  TECHNIQUE: Contiguous axial images were obtained from the base of the skull through the vertex without intravenous contrast.  COMPARISON:  Mar 16, 2015  FINDINGS: There is persistent, extensive ex vacuo phenomenon in both frontal lobes with mild residua of prior hemorrhage in these areas, stable. The encephalomalacia extends along the medial frontal lobes to the vertex and extends posteriorly into the medial proximal parietal lobes along the most superior aspects. Prominence of the lateral and third  ventricles is a stable finding. There is a stable aneurysm clip just inferior and anterior to the third ventricle, slightly toward the left, stable. There is no mass or extra-axial fluid. No acute hemorrhage evident. No new gray-white compartment lesion. No acute infarct. Patchy small vessel disease in the centra semiovale is stable.  There is postoperative change with evidence of left frontal craniotomy, stable. No new bone lesions. Mastoid air cells are clear.  IMPRESSION: Stable encephalomalacia and bilateral frontal infarcts, noted previously. Infarct extends into the anterior left temporal lobe, stable. More superiorly, infarction extends into the medial anterior parietal lobes. There is no new gray-white compartment lesion. No  acute infarct evident. No acute hemorrhage or mass effect. Prominence of the lateral and third ventricles is stable. Evidence of residua of old hemorrhage within the areas of encephalomalacia, stable. Aneurysm clip present slightly inferior to the third ventricle.   Electronically Signed   By: Bretta Bang III M.D.   On: 06/01/2015 16:41   Dg Chest Port 1 View  06/01/2015   CLINICAL DATA:  Altered mental status.  EXAM: PORTABLE CHEST - 1 VIEW  COMPARISON:  Mar 18, 2015.  FINDINGS: The heart size and mediastinal contours are within normal limits. Both lungs are clear. No pneumothorax or pleural effusion is noted. Hypoinflation of the lungs is noted. The visualized skeletal structures are unremarkable.  IMPRESSION: No acute cardiopulmonary abnormality seen.   Electronically Signed   By: Lupita Raider, M.D.   On: 06/01/2015 15:47    Scheduled Meds: . cholecalciferol  5,000 Units Oral Q breakfast  . cholestyramine  4 g Oral TID  . enoxaparin (LOVENOX) injection  40 mg Subcutaneous Q24H  . famotidine  20 mg Oral BID  . feeding supplement (ENSURE ENLIVE)  237 mL Oral BID BM  . lacosamide  100 mg Oral BID  . levETIRAcetam  1,500 mg Oral BID  . minoxidil  10 mg Oral BID  .  multivitamin with minerals  1 tablet Oral Q breakfast  . phenytoin  200 mg Oral BID  . sodium chloride  1,000 mL Intravenous Once  . vitamin C  1,000 mg Oral Daily   Continuous Infusions: . dextrose 5 % and 0.45 % NaCl with KCl 20 mEq/L      Principal Problem:   Hypernatremia Active Problems:   HTN (hypertension)   Seizures   Localization-related symptomatic epilepsy and epileptic syndromes with complex partial seizures, not intractable, without status epilepticus   Hx of ischemic right ACA stroke   History of ischemic left ACA stroke   Acute encephalopathy   Pressure ulcer    Time spent: 30 min    Bora Bost  [facility] Pager [phone]. If 7PM-7AM, please contact night-coverage at [email or web address], password [facility] 06/02/2015, 10:12 AM  LOS: 1 day

## 2015-06-02 NOTE — Progress Notes (Signed)
Phlebotomy reported that pt refused lab draw. Pt unable to communicate verbally. Pt has hx of epilepsy and shakes head from time to time. When asked if pt cannot control head movement she knodded yes. Will notify phlebotomy and MD.

## 2015-06-02 NOTE — Progress Notes (Signed)
Full report given to Delice Bison of [facility]E. Pt stable for transport.

## 2015-06-02 NOTE — Progress Notes (Signed)
Initial Nutrition Assessment  DOCUMENTATION CODES:   Obesity unspecified  INTERVENTION:  - Continue Ensure Enlive (needs to be thickened) po BID, each supplement provides 350 kcal and 20 grams of protein - RD will continue to monitor for needs following SLP evaluation; assess if TF needs to be initiated  NUTRITION DIAGNOSIS:   Increased nutrient needs related to wound healing as evidenced by estimated needs.  GOAL:   Patient will meet greater than or equal to 90% of their needs  MONITOR:   PO intake, Supplement acceptance, Weight trends, Labs  REASON FOR ASSESSMENT:   Malnutrition Screening Tool, Consult Assessment of nutrition requirement/status  ASSESSMENT:  49 y.o. female Nursing Home Resident with a history of SAH, Seizure Disorder, HTN who was sent to the ED with observed changes in her behavior.She was less responsive than her usual and reported by her family as being drowsy.Per report of facility she had not been eating or drinking well for the past 2 days. 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   Pt seen for MST and consult. BMI indicates obesity. She is quadriplegic. Notes indicate hx of PEG placement; will monitor for need to provide nutrition via PEG. Pt is nonverbal and no family/visitors present at time of visit. RN reports that pt took a few bites of applesauce this AM but did not seem to do well with it so SLP evaluation is pending. RD will monitor for SLP recommendations and provide further recommendations as needed.  Pt is unable to meet needs at this time. Physical assessment shows no signs of  muscle or fat wasting. Per weight hx review, pt has lost 12 lbs (6% body weight) in the past 2 months which is not significant for time frame. Medications reviewed. Labs reviewed; Na: 168 mmol/L and Cl >130 mmol/L but MD note states no visual signs of dehydration.      Diet Order:  DIET - DYS 1 Room service appropriate?: Yes; Fluid consistency:: Thin  Skin:  Wound (see comment) (stage 3 R hip and stage 2 sacral pressure ulcers)  Last BM:  PTA  Height:   Ht Readings from Last 1 Encounters:  06/01/15  (1.626 m)    Weight:   Wt Readings from Last 1 Encounters:  06/01/15 184 lb 11.9 oz (83.8 kg)    Ideal Body Weight:  54.54 kg (kg)  BMI:  Body mass index is 31.7 kg/(m^2).  Estimated Nutritional Needs:   Kcal:  2100-2300  Protein:  120-130 grams  Fluid:  >3 L/day  EDUCATION NEEDS:   No education needs identified at this time     Trenton Gammon, RD, LDN Inpatient Clinical Dietitian Pager # [phone] After hours/weekend pager # [phone]

## 2015-06-02 NOTE — Consult Note (Addendum)
WOC wound consult note Reason for Consult: Consult requested for ischium and sacrum wounds. Wound type: Sacrum with 8X8cm red macerated skin, appearance consistent with moisture associated skin damage.  Stage 2 pressure injury in the center is .2X.2X.1cm, pink and dry, no odor or drainage. 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  Small amt yellow drainage, no odor. Pressure Ulcer POA: Yes Dressing procedure/placement/frequency: Moist gauze packing to absorb drainage and promote healing to ischium.  Foam dressing to protect sacrum.  Discussed plan of care with bedside nurse and patient; she is nonverbal. Please re-consult if further assistance is needed.  Thank-you,  Cammie Mcgee MSN, RN, CWOCN, [place], CNS [phone]

## 2015-06-02 NOTE — Progress Notes (Signed)
CRITICAL VALUE ALERT  Critical value received:  Na+ 168, CL>130  Date of notification:  06/02/2015  Time of notification:  0519  Critical value read back:yes  Nurse who received alert:  Barkley Bruns RN  MD notified (1st page):  Merdis Delay NP  Time of first page:  0520  MD notified (2nd page):N/A  Time of second page:N/A  Responding MD:  Merdis Delay  Time MD responded:  N/A

## 2015-06-02 NOTE — Progress Notes (Signed)
0600- Critical Values alert posted- values were in line with previously  drawn labs- no orders received.

## 2015-06-02 NOTE — Progress Notes (Signed)
Pt NA critical at 162. MD Short notified via text/page.  PICC to be placed by radiology as per PICC RN's recommendations. MD Short notified via text/page as well.

## 2015-06-02 NOTE — Care Management Note (Signed)
Case Management Note  Patient Details  Name: Cloe Sockwell MRN: [id number] Date of Birth: 1966/06/02  Subjective/Objective:      hypernatremia               Action/Plan: Date:  June 02, 2015 U.R. performed for needs and level of care. Will continue to follow for Case Management needs.  Marcelle Smiling, RN, BSN, [place]   [phone]  Expected Discharge Date:   (unknown)               Expected Discharge Plan:  Skilled Nursing Facility  In-House Referral:  Clinical Social Work  Discharge planning Services  CM Consult  Post Acute Care Choice:  NA Choice offered to:  NA  DME Arranged:  N/A DME Agency:  NA  HH Arranged:  NA HH Agency:  NA  Status of Service:  In process, will continue to follow  Medicare Important Message Given:    Date Medicare IM Given:    Medicare IM give by:    Date Additional Medicare IM Given:    Additional Medicare Important Message give by:     If discussed at Long Length of Stay Meetings, dates discussed:    Additional Comments:  Golda Acre, RN 06/02/2015, 8:55 AM

## 2015-06-02 NOTE — Progress Notes (Signed)
Spoke with primary RN regarding PICC insertion.   I called son to get consent for PICC.  He informed me that we were not able to get a PICC in her.   "We tried about 10 times and couldn't get it"   "Her arm was bruised"   Per MD order I informed RN of what son had told me.   Primary RN contacting MD for further instructions.

## 2015-06-03 ENCOUNTER — Inpatient Hospital Stay (HOSPITAL_COMMUNITY): Payer: 59

## 2015-06-03 LAB — BASIC METABOLIC PANEL
ANION GAP: 8 (ref 5–15)
ANION GAP: 6 (ref 5–15)
Anion gap: 5 (ref 5–15)
Anion gap: 4 — ABNORMAL LOW (ref 5–15)
Anion gap: 6 (ref 5–15)
BUN: 11 mg/dL (ref 6–20)
BUN: 11 mg/dL (ref 6–20)
BUN: 12 mg/dL (ref 6–20)
BUN: 12 mg/dL (ref 6–20)
BUN: 10 mg/dL (ref 6–20)
CALCIUM: 8.9 mg/dL (ref 8.9–10.3)
CALCIUM: 9.1 mg/dL (ref 8.9–10.3)
CALCIUM: 8.8 mg/dL — AB (ref 8.9–10.3)
CHLORIDE: 129 mmol/L — AB (ref 101–111)
CHLORIDE: 128 mmol/L — AB (ref 101–111)
CO2: 22 mmol/L (ref 22–32)
CO2: 26 mmol/L (ref 22–32)
CO2: 25 mmol/L (ref 22–32)
CO2: 24 mmol/L (ref 22–32)
CO2: 26 mmol/L (ref 22–32)
CREATININE: 0.69 mg/dL (ref 0.44–1.00)
CREATININE: 0.74 mg/dL (ref 0.44–1.00)
CREATININE: 0.68 mg/dL (ref 0.44–1.00)
Calcium: 9.3 mg/dL (ref 8.9–10.3)
Calcium: 9.3 mg/dL (ref 8.9–10.3)
Chloride: 129 mmol/L — ABNORMAL HIGH (ref 101–111)
Chloride: 128 mmol/L — ABNORMAL HIGH (ref 101–111)
Chloride: 125 mmol/L — ABNORMAL HIGH (ref 101–111)
Creatinine, Ser: 0.72 mg/dL (ref 0.44–1.00)
Creatinine, Ser: 0.61 mg/dL (ref 0.44–1.00)
GFR calc Af Amer: >60 mL/min (ref >60)
GFR calc Af Amer: >60 mL/min (ref >60)
GFR calc non Af Amer: >60 mL/min (ref >60)
GFR calc non Af Amer: >60 mL/min (ref >60)
GLUCOSE: 111 mg/dL — AB (ref 65–99)
Glucose, Bld: 114 mg/dL — ABNORMAL HIGH (ref 65–99)
Glucose, Bld: 101 mg/dL — ABNORMAL HIGH (ref 65–99)
Glucose, Bld: 112 mg/dL — ABNORMAL HIGH (ref 65–99)
Glucose, Bld: 125 mg/dL — ABNORMAL HIGH (ref 65–99)
Potassium: 3.2 mmol/L — ABNORMAL LOW (ref 3.5–5.1)
Potassium: 2.9 mmol/L — ABNORMAL LOW (ref 3.5–5.1)
Potassium: 3 mmol/L — ABNORMAL LOW (ref 3.5–5.1)
Potassium: 3.2 mmol/L — ABNORMAL LOW (ref 3.5–5.1)
Potassium: 3.8 mmol/L (ref 3.5–5.1)
Sodium: 159 mmol/L — ABNORMAL HIGH (ref 135–145)
Sodium: 161 mmol/L (ref 135–145)
Sodium: 159 mmol/L — ABNORMAL HIGH (ref 135–145)
Sodium: 157 mmol/L — ABNORMAL HIGH (ref 135–145)
Sodium: 155 mmol/L — ABNORMAL HIGH (ref 135–145)

## 2015-06-03 LAB — CBC
HCT: 41.7 % (ref 36.0–46.0)
Hemoglobin: 13.4 g/dL (ref 12.0–15.0)
MCH: 29.9 pg (ref 26.0–34.0)
MCHC: 32.1 g/dL (ref 30.0–36.0)
MCV: 93.1 fL (ref 78.0–100.0)
PLATELETS: 94 10*3/uL — AB (ref 150–400)
RBC: 4.48 MIL/uL (ref 3.87–5.11)
RDW: 14.1 % (ref 11.5–15.5)
WBC: 4.4 10*3/uL (ref 4.0–10.5)

## 2015-06-03 LAB — PHOSPHORUS: PHOSPHORUS: 3.7 mg/dL (ref 2.5–4.6)

## 2015-06-03 LAB — MAGNESIUM: MAGNESIUM: 2.1 mg/dL (ref 1.7–2.4)

## 2015-06-03 MED ORDER — PHENYTOIN SODIUM 50 MG/ML IJ SOLN
200.0000 mg | Freq: Three times a day (TID) | INTRAMUSCULAR | Status: DC
  Filled 2015-06-03: qty 4

## 2015-06-03 MED ORDER — SODIUM CHLORIDE 0.9 % IV SOLN
1500.0000 mg | Freq: Two times a day (BID) | INTRAVENOUS | Status: DC
  Administered 2015-06-03 – 2015-06-06 (×7): 1500 mg via INTRAVENOUS
  Filled 2015-06-03 (×7): qty 15

## 2015-06-03 MED ORDER — POTASSIUM CHLORIDE 10 MEQ/100ML IV SOLN
INTRAVENOUS | Status: AC
  Administered 2015-06-03: 10 meq
  Filled 2015-06-03: qty 100

## 2015-06-03 MED ORDER — SODIUM CHLORIDE 0.9 % IV SOLN
100.0000 mg | Freq: Two times a day (BID) | INTRAVENOUS | Status: DC
  Administered 2015-06-03 – 2015-06-05 (×6): 100 mg via INTRAVENOUS
  Filled 2015-06-03 (×8): qty 10

## 2015-06-03 MED ORDER — CHLORHEXIDINE GLUCONATE CLOTH 2 % EX PADS
6.0000 | MEDICATED_PAD | Freq: Every day | CUTANEOUS | Status: DC
  Administered 2015-06-03 – 2015-06-06 (×4): 6 via TOPICAL

## 2015-06-03 MED ORDER — POTASSIUM CHLORIDE 10 MEQ/100ML IV SOLN
10.0000 meq | INTRAVENOUS | Status: AC
  Administered 2015-06-03 (×4): 10 meq via INTRAVENOUS
  Filled 2015-06-03: qty 100

## 2015-06-03 MED ORDER — MUPIROCIN 2 % EX OINT
1.0000 "application " | TOPICAL_OINTMENT | Freq: Two times a day (BID) | CUTANEOUS | Status: DC
  Administered 2015-06-03 – 2015-06-06 (×7): 1 via NASAL
  Filled 2015-06-03: qty 22

## 2015-06-03 MED ORDER — FAMOTIDINE IN NACL 20-0.9 MG/50ML-% IV SOLN
20.0000 mg | Freq: Two times a day (BID) | INTRAVENOUS | Status: DC
  Administered 2015-06-03 – 2015-06-06 (×7): 20 mg via INTRAVENOUS
  Filled 2015-06-03 (×8): qty 50

## 2015-06-03 MED ORDER — PHENYTOIN SODIUM 50 MG/ML IJ SOLN
100.0000 mg | Freq: Three times a day (TID) | INTRAMUSCULAR | Status: DC
  Administered 2015-06-03 – 2015-06-06 (×9): 100 mg via INTRAVENOUS
  Filled 2015-06-03 (×10): qty 2

## 2015-06-03 MED ORDER — POTASSIUM CL IN DEXTROSE 5% 20 MEQ/L IV SOLN
20.0000 meq | INTRAVENOUS | Status: DC
Start: 2015-06-03 — End: 2015-06-05
  Administered 2015-06-03 – 2015-06-04 (×5): 20 meq via INTRAVENOUS
  Filled 2015-06-03 (×6): qty 1000

## 2015-06-03 MED ORDER — POTASSIUM CHLORIDE 10 MEQ/100ML IV SOLN
INTRAVENOUS | Status: AC
  Filled 2015-06-03: qty 100

## 2015-06-03 MED ORDER — MORPHINE SULFATE 2 MG/ML IJ SOLN
1.0000 mg | INTRAMUSCULAR | Status: DC | PRN
Start: 2015-06-03 — End: 2015-06-06
  Administered 2015-06-03 – 2015-06-05 (×4): 1 mg via INTRAVENOUS
  Filled 2015-06-03 (×4): qty 1

## 2015-06-03 MED ORDER — POTASSIUM CHLORIDE 10 MEQ/100ML IV SOLN
10.0000 meq | INTRAVENOUS | Status: AC
  Administered 2015-06-03: 10 meq via INTRAVENOUS
  Filled 2015-06-03: qty 100

## 2015-06-03 MED ORDER — LIDOCAINE HCL 1 % IJ SOLN
INTRAMUSCULAR | Status: AC
  Filled 2015-06-03: qty 20

## 2015-06-03 NOTE — Clinical Social Work Note (Signed)
Clinical Social Work Assessment  Patient Details  Name: [name] MRN: [id number] Date of Birth: 06-16-1966  Date of referral:  06/03/15               Reason for consult:  Facility Placement                Permission sought to share information with:  Facility Industrial/product designer granted to share information::  Yes, Verbal Permission Granted  Name::        Agency::     Relationship::     Contact Information:     Housing/Transportation Living arrangements for the past 2 months:  Skilled Nursing Facility Source of Information:  Adult Children Patient Interpreter Needed:  None Criminal Activity/Legal Involvement Pertinent to Current Situation/Hospitalization:  No - Comment as needed Significant Relationships:  Adult Children, Siblings Lives with:  Facility Resident Do you feel safe going back to the place where you live?  Yes Need for family participation in patient care:  Yes (Comment)  Care giving concerns:  CSW received consult that patient was admitted from [facility].    Social Worker assessment / plan:  CSW spoke with patient's sister, [name] who informed CSW that the family would like to try to find another SNF for her to go to at discharge.   Employment status:    Wellsite geologist, Medicaid In [place] PT Recommendations:  Not assessed at this time Information / Referral to community resources:  Skilled Nursing Facility  Patient/Family's Response to care:  CSW faxed information out to [facility], confirmed with [name] at [facility] that they do not have any long term/Medicaid beds available currently. CSW provided other SNF bed offers to sister who is familiar with [name], though sister states that she had been there before and is not interested in her going there. Sister will check out [facility] over the weekend, but if she does not approve of [name] - patient will likely return to [facility] until a long term bed opens up at [facility].   Patient/Family's Understanding of and Emotional Response to Diagnosis, Current Treatment, and Prognosis:    Emotional Assessment Appearance:  Appears stated age Attitude/Demeanor/Rapport:    Affect (typically observed):  Quiet, Calm Orientation:  Oriented to Self Alcohol / Substance use:    Psych involvement (Current and /or in the community):  No (Comment)  Discharge Needs  Concerns to be addressed:    Readmission within the last 30 days:    Current discharge risk:    Barriers to Discharge:      Meghan Repress, LCSW 06/03/2015, 2:36 PM

## 2015-06-03 NOTE — Progress Notes (Signed)
A total of 6 runs of potassium ordered.  Due to placement of PICC line potassium was not given on time.  A total of 5 units were given during day shift.  Night RN made aware that one more run is due.

## 2015-06-03 NOTE — Progress Notes (Addendum)
[facility] PROGRESS NOTE  Ernesteen Mihalic ZOX:[id number] DOB: Apr 22, 1966 DOA: 06/01/2015 PCP: Ruthe Mannan, MD  Brief Summary  [name] is a 48 y.o. female with complicated medical history.  She was hospitalized at [facility] because of front headache which was attributed to an aneurysm.  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  She required a more than two month hospitalization with tracheostomy and PEG tube placement and was eventually discharged to SNF with guarded prognosis, inability to communicate or follow commands and quadriplegia.  She has been at SNF ever since except for a hospitalization for tracheitis in 04/2014 and for hypernatremia and C. Diff diarrhea in 03/2015.  She was brought to the hospital for increased drowsiness and decreased PO intake.  In the ED a CT scan of the head was performed and was negative for acute findings. She was also found to have hypernatremia to 171 with hyperchloremia of > 130.   Assessment/Plan  Hypernatremia, second admission in last 2-3 months for the same.  I suspect that she either needs her diet liberalized as she has been given dysphagia 1 with nectar thick liquids OR she needs a feeding tube replaced.  She does not appear particularly dehydrated on exam.   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  At baseline, she can smile, make eye contact, give kisses, but she is nonverbal and quadriplegic and does not always follow commands.  Epilepsy with complex partial seizures, does not appear to be in status epilepticus -  Continue Keppra, Vimpat, and phenytoin  Hypertension, current hypotensive -  Continue to hold clonidine, metoprolol, and minoxidil for now  Chronic thrombocytopenia, likely related to her antiepileptics -  Monitor for bleeding  Stage II sacral decubitus ulcer and a tracking wound on the right lower buttock -  Wound care consultation -  Air mattress  Reviewed records regarding cholestyramine which seems to have been added sometime between 02/2014 and 03/2014.  Not a first line medication for cholesterol and patient not having diarrhea, so will stop for now and monitor only.    Diet:  Dysphagia 1 with thin liquids Access:  PIV, will attempt to get PICC line or other central line IVF:  Yes Proph:  Lovenox  Code Status: Full code Family Communication: Patient and her son by phone Disposition Plan: Son states that he would like to her to go to [facility] and does not want to go back to her previous skilled nursing facility   Consultants:  None  Procedures:  CT head  Antibiotics:  None   HPI/Subjective:  Patient is nonverbal, closes her eyes very tightly when I try to open them and will not follow commands.    Objective: Filed Vitals:   06/02/15 1600 06/02/15 1927 06/03/15 0025 06/03/15 8119  BP: 102/37 148/89 123/73 133/64  Pulse: 85 76 84 60  Temp:  98.3 F (36.8 C) 98.7 F (37.1 C) 98.3 F (36.8 C)  TempSrc:  Oral Oral Oral  Resp: 48 Height:      Weight:      SpO2: 97% 96% 100% 94%    Intake/Output Summary (Last 24 hours) at 06/03/15 1259 Last data filed at 06/03/15 1100  Gross per 24 hour  Intake 1077.5 ml  Output      0 ml  Net 1077.5 ml   Filed Weights    06/01/15 2000  Weight: 83.8 kg (184 lb 11.9 oz)   Body mass index is 31.7 kg/(m^2).  Exam:   General:  Adult female, eyes open and smiled briefly, No acute distress  HEENT:  NCAT, MMM  Cardiovascular:  RRR, nl S1, S2 no mrg, 2+ pulses, warm extremities  Respiratory:  CTAB, no increased WOB  Abdomen:   NABS, soft, NT/ND  MSK:   Decreased tone and bulk, trace bilateral LEE  Neuro:  Quadriplegic  Data Reviewed: Basic Metabolic Panel:  Recent Labs Lab 06/02/15 1624 06/02/15 2330 06/03/15 0315 06/03/15 0700 06/03/15 1010  NA 162* 159* 161* 159* 157*  K 3.1* 3.2* 2.9* 3.0* 3.2*  CL 128* 129* 129* 128* 128*  CO2 GLUCOSE 131* 114* 101* 112* 125*  BUN CREATININE 0.69 0.69 0.72 0.74 0.68  CALCIUM 9.6 9.3 9.3 8.9 9.1  MG  --   --   --  2.1  --   PHOS  --   --   --  3.7  --    Liver Function Tests:  Recent Labs Lab 06/01/15 1703  AST 30  ALT 41  ALKPHOS 133*  BILITOT 0.7  PROT 7.2  ALBUMIN 3.6   No results for input(s): LIPASE, AMYLASE in the last 168 hours. No results for input(s): AMMONIA in the last 168 hours. CBC:  Recent Labs Lab 06/01/15 1703 06/02/15 0355 06/03/15 1010  WBC 5.3 4.8 4.4  NEUTROABS 2.2  --   --   HGB 12.7 12.8 13.4  HCT 42.2 42.2 41.7  MCV 96.3 98.4 93.1  PLT 101* 101* 94*    Recent Results (from the past 240 hour(s))  MRSA PCR Screening     Status: Abnormal   Collection Time: 06/01/15  8:23 PM  Result Value Ref Range Status   MRSA by PCR POSITIVE (A) NEGATIVE Final    Comment:        The GeneXpert MRSA Assay (FDA approved for NASAL specimens only), is one component of a comprehensive MRSA colonization surveillance program. It is not intended to diagnose MRSA infection nor to guide or monitor treatment for MRSA infections. RESULT CALLED TO, READ BACK BY AND VERIFIED WITH: Joslyn Devon RN @ 2242 ON 06/01/15 BY C DAVIS      Studies: Ct Head Wo Contrast  06/01/2015   CLINICAL DATA:  Altered  level of consciousness.  EXAM: CT HEAD WITHOUT CONTRAST  TECHNIQUE: Contiguous axial images were obtained from the base of the skull through the vertex without intravenous contrast.  COMPARISON:  Mar 16, 2015  FINDINGS: There is persistent, extensive ex vacuo phenomenon in both frontal lobes with mild residua of prior hemorrhage in these areas, stable. The encephalomalacia extends along the medial frontal lobes to the vertex and extends posteriorly into the medial proximal parietal lobes along the most superior aspects. Prominence of the  lateral and third ventricles is a stable finding. There is a stable aneurysm clip just inferior and anterior to the third ventricle, slightly toward the left, stable. There is no mass or extra-axial fluid. No acute hemorrhage evident. No new gray-white compartment lesion. No acute infarct. Patchy small vessel disease in the centra semiovale is stable.  There is postoperative change with evidence of left frontal craniotomy, stable. No new bone lesions. Mastoid air cells are clear.  IMPRESSION: Stable encephalomalacia and bilateral frontal infarcts, noted previously. Infarct extends into the anterior left temporal lobe, stable. More superiorly, infarction extends into the medial anterior parietal lobes. There is no new gray-white compartment lesion. No acute infarct evident. No acute hemorrhage or mass effect. Prominence of the lateral and third ventricles is stable. Evidence of residua of old hemorrhage within the areas of encephalomalacia, stable. Aneurysm clip present slightly inferior to the third ventricle.   Electronically Signed   By: Bretta Bang III M.D.   On: 06/01/2015 16:41   Dg Chest Port 1 View  06/03/2015   CLINICAL DATA:  Hypoxia  EXAM: PORTABLE CHEST - 1 VIEW  COMPARISON:  June 01, 2015  FINDINGS: There is slight left base atelectasis. There is no edema or consolidation. Heart size and pulmonary vascularity are within normal limits. No adenopathy. There are no  focal bone lesions.  IMPRESSION: Mild left base atelectasis.  No edema or consolidation.   Electronically Signed   By: Bretta Bang III M.D.   On: 06/03/2015 11:39   Dg Chest Port 1 View  06/01/2015   CLINICAL DATA:  Altered mental status.  EXAM: PORTABLE CHEST - 1 VIEW  COMPARISON:  Mar 18, 2015.  FINDINGS: The heart size and mediastinal contours are within normal limits. Both lungs are clear. No pneumothorax or pleural effusion is noted. Hypoinflation of the lungs is noted. The visualized skeletal structures are unremarkable.  IMPRESSION: No acute cardiopulmonary abnormality seen.   Electronically Signed   By: Lupita Raider, M.D.   On: 06/01/2015 15:47   Dg Swallowing Func-speech Pathology  06/03/2015    Objective Swallowing Evaluation:    Patient Details  Name: Lashaun Poch MRN: [id number] Date of Birth: 12-Mar-1966  Today's Date: 06/03/2015 Time: SLP Start Time (ACUTE ONLY): 0859-SLP Stop Time (ACUTE ONLY): 0935 SLP Time Calculation (min) (ACUTE ONLY): 36 min  Past Medical History:  Past Medical History  Diagnosis Date  . Hypertension   . SAH (subarachnoid hemorrhage)   . Seizure   . Stroke   . Tracheostomy status   . Tracheomalacia   . DVT (deep venous thrombosis)    Past Surgical History:  Past Surgical History  Procedure Laterality Date  . Abdominal hysterectomy    . Cholecystectomy     HPI:  Other Pertinent Information: Katura Eatherly is a 49 y.o. female with  complicated medical history. She was hospitalized at [facility] because of  front headache which was attributed to an aneurysm. During her clipping  procedure, she had rupture of the aneurysm resulting in devastating  subarachnoid hemorrhage, ischemic strokes, and seizures. She required a  more than two month hospitalization with tracheostomy and PEG tube  placement and was eventually discharged to SNF with guarded prognosis,  inability to communicate or follow commands and quadriplegia. She has  been at SNF ever since except for a  hospitalization for tracheitis in  04/2014 and for hypernatremia and C. Diff diarrhea in 03/2015. She was  brought to the hospital for increased drowsiness and decreased  PO intake.  In the ED a CT scan of the head was performed and was negative for acute  findings. She was also found to have hypernatremia to 171 with  hyperchloremia of > 130.Most recent MBS in  09/2014 recommended Dys 3  diet and nectar thick liquids. She has been on Dys 1 diet and nectar thick  liquids at SNF per MD note, however has had difficulty maintaining  hydration and son is now requesting consideration of thin liquids.  No Data Recorded  Assessment / Plan / Recommendation CHL IP CLINICAL IMPRESSIONS 06/03/2015  Therapy Diagnosis Severe oral phase dysphagia;Moderate oral phase  dysphagia;Mild cervical esophageal phase dysphagia;Mild pharyngeal phase  dysphagia  Clinical Impression  Pt presents with moderately severe oral and mild pharyngeal dysphagia  without aspiration of any consistency tested.  Oral transiting is very  delayed and discoordinated resulting in premature spillage into pharynx  and oral residuals.  Pharyngeal swallow was overall strong albeit mildly  delayed.  Minimal residuals noted with liquids more than solids.  Trace  laryngeal penetration of nectar noted that cleared with further swallows  due to adequacy of laryngeal closure.  Backflow of liquids from proximal  esophagus into pharynx noted without pt adequate awareness.  Suspect  component of esophageal dysphagia that may be consistent with dysmotility  given appearance of delayed clearance and backflow.    Unfortunately, due to pt's motor planning deficits she is unable to  cough/throat clear or dry swallow on command.   Due to these factors and  dysphagia, pt remains an aspiration/malnutrition risk. However, she did  AUDIBLY aspirate on MBS in 2015 and therefore feasible consideration is to  advance liquids to thin and monitor tolerance closely.  Highly recommend   follow up at SNF for tolerance of po diet and adjustments as needed.         CHL IP TREATMENT RECOMMENDATION 06/03/2015  Treatment Recommendations Therapy as outlined in treatment plan below     CHL IP DIET RECOMMENDATION 06/03/2015  SLP Diet Recommendations Thin;Dysphagia 1 (Puree)  Liquid Administration via Cup, straw  Medication Administration Crushed with puree  Compensations Slow rate;Small sips/bites;Check for pocketing  Postural Changes and/or Swallow Maneuvers Assure stays upright     CHL IP OTHER RECOMMENDATIONS 06/03/2015  Recommended Consults (None)  Oral Care Recommendations Oral care BID  Other Recommendations (None)     CHL IP FOLLOW UP RECOMMENDATIONS 03/22/2015  Follow up Recommendations Skilled Nursing facility     [facility] IP FREQUENCY AND DURATION 06/03/2015  Speech Therapy Frequency (ACUTE ONLY) min 2x/week  Treatment Duration 1 week         CHL IP REASON FOR REFERRAL 06/03/2015  Reason for Referral Objectively evaluate swallowing function     CHL IP ORAL PHASE 06/03/2015  Oral Phase Impaired      CHL IP PHARYNGEAL PHASE 06/03/2015  Pharyngeal Phase Impaired      CHL IP CERVICAL ESOPHAGEAL PHASE 06/03/2015  Cervical Esophageal Phase Impaired  Nectar Teaspoon Reduced cricopharyngeal relaxation  Nectar Cup Reduced cricopharyngeal relaxation  Nectar Straw Reduced cricopharyngeal relaxation  Nectar Sippy Cup (None)  Thin Teaspoon Reduced cricopharyngeal relaxation  Thin Cup Reduced cricopharyngeal relaxation;Esophageal backflow into the  pharynx  Thin Straw Reduced cricopharyngeal relaxation;Esophageal backflow into the  pharynx  Thin Sippy Cup (None)  Cervical Esophageal Comment appearance of decreased clearance of esophagus  - backflow of liquid from proximal esophagus noted into pharynx without pt  overt sensation= awareness    CHL IP GO 09/22/2014  Functional Assessment  Tool Used skilled clinical judgement  Functional Limitations Swallowing  Swallow Current Status [phone]) CK  Swallow Goal Status (X9147) CK   Swallow Discharge Status [phone]) CK           Donavan Burnet, MS [facility] SLP [phone]     Scheduled Meds: . Chlorhexidine Gluconate Cloth  6 each Topical Q0600  . enoxaparin (LOVENOX) injection  40 mg Subcutaneous Q24H  . famotidine (PEPCID) IV  20 mg Intravenous BID  . feeding supplement (ENSURE ENLIVE)  237 mL Oral BID BM  . lacosamide (VIMPAT) IV  100 mg Intravenous Q12H  . levETIRAcetam  1,500 mg Intravenous BID  . lidocaine      . mupirocin ointment  1 application Nasal BID  . phenytoin (DILANTIN) IV  100 mg Intravenous Q8H  . potassium chloride  10 mEq Intravenous Q1 Hr x 2   Continuous Infusions: . dextrose 5 % with KCl 20 mEq / L 20 mEq (06/03/15 0746)    Principal Problem:   Hypernatremia Active Problems:   HTN (hypertension)   Seizures   Localization-related symptomatic epilepsy and epileptic syndromes with complex partial seizures, not intractable, without status epilepticus   Hx of ischemic right ACA stroke   History of ischemic left ACA stroke   Acute encephalopathy   Pressure ulcer    Time spent: 30 min    Ciela Mahajan  [facility] Pager [phone]. If 7PM-7AM, please contact night-coverage at [email or web address], password [facility] 06/03/2015, 12:59 PM  LOS: 2 days

## 2015-06-03 NOTE — Progress Notes (Signed)
On-call provider notified of critical Na+ of 161 via text page.

## 2015-06-03 NOTE — Progress Notes (Signed)
Pt only IV infiltrated.  Waiting for PICC line to be placed before any medications can be given. Pt refusing oral meds.  MD made aware of medication delay.

## 2015-06-03 NOTE — Procedures (Signed)
US/fluoroscopic guided right DL basilic vein PICC placed. Length 42 cm. Tip SVC/RA junction. No immediate complications. Ok to use.

## 2015-06-03 NOTE — Progress Notes (Signed)
Other Pertinent Information: [name] is a 49 y.o. female with complicated medical history. She was hospitalized at [facility] because of front headache which was attributed to an aneurysm. 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 She required a more than two month hospitalization with tracheostomy and PEG tube placement and was eventually discharged to SNF with guarded prognosis, inability to communicate or follow commands and quadriplegia. She has been at SNF ever since except for a hospitalization for tracheitis in 04/2014 and for hypernatremia and C. Diff diarrhea in 03/2015. She was brought to the hospital for increased drowsiness and decreased PO intake. In the ED a CT scan of the head was performed and was negative for acute findings. She was also found to have hypernatremia to 171 with hyperchloremia of > 130.Most recent MBS in 09/2014 recommended Dys 3 diet and nectar thick liquids. She has been on Dys 1 diet and nectar thick liquids at SNF per MD note, however has had difficulty maintaining hydration and son is now requesting consideration of thin liquids.  No Data Recorded  Assessment / Plan / Recommendation CHL IP CLINICAL IMPRESSIONS 06/03/2015  Therapy Diagnosis Severe oral phase dysphagia;Moderate oral phase dysphagia;Mild cervical esophageal phase dysphagia;Mild pharyngeal phase dysphagia  Clinical Impression  Pt presents with moderately severe oral and mild pharyngeal dysphagia without aspiration of any consistency tested. Oral transiting is very delayed and discoordinated resulting in premature spillage into pharynx and oral residuals. Pharyngeal swallow was overall strong albeit mildly delayed. Minimal residuals noted with liquids more than solids. Trace laryngeal penetration of nectar noted that cleared with further swallows due to adequacy of laryngeal closure.  Backflow of liquids from proximal esophagus into pharynx noted without pt adequate awareness. Suspect component of esophageal dysphagia that may be consistent with dysmotility given appearance of delayed clearance and backflow.   Unfortunately, due to pt's motor planning deficits she is unable to cough/throat clear or dry swallow on command. Due to these factors and dysphagia, pt remains an aspiration/malnutrition risk. However, she did AUDIBLY aspirate on MBS in 2015 and therefore feasible consideration is to advance liquids to thin and monitor tolerance closely. Highly recommend follow up at SNF for tolerance of po diet and adjustments as needed.       CHL IP TREATMENT RECOMMENDATION 06/03/2015  Treatment Recommendations Therapy as outlined in treatment plan below     CHL IP DIET RECOMMENDATION 06/03/2015  SLP Diet Recommendations Thin;Dysphagia 1 (Puree)  Liquid Administration via Cup, straw  Medication Administration Crushed with puree  Compensations Slow rate;Small sips/bites;Check for pocketing  Postural Changes and/or Swallow Maneuvers Assure stays upright     CHL IP OTHER RECOMMENDATIONS 06/03/2015  Recommended Consults (None)  Oral Care Recommendations Oral care BID        Donavan Burnet, MS [facility] SLP [phone]

## 2015-06-03 NOTE — Progress Notes (Signed)
Speech Language Pathology Treatment: Dysphagia  Patient Details Name: [name] MRN: [id number] DOB: [birth year] Today's Date: 06/03/2015 Time: 4098-1191 SLP Time Calculation (min) (ACUTE ONLY): 10 min  Assessment / Plan / Recommendation Clinical Impression  SLP spoke to MD re: plan for dietary advancement and for clinical observation of diet advancement.  RN reports pt with excessive oral holding without ability to take medications crushed with puree.  Advised her to consider with ice cream or liquids.  SLP educated pt to plan and assisted her to consume thin coca cola.  Delayed swallow noted followed by intermittent multiple swallow.  No overt indication of airway compromise - although after approximately 1 ounce intake, pt with very delayed abdominal movement that may be consistent with attempt at cough.    Will follow pt closely for po tolerance and indication for diet modification.  Recommend continue diet with strict precautions. Advised RN that liquids will be easier for pt to consume due to oral deficits.    HPI Other Pertinent Information: [name] is a 49 y.o. female with complicated medical history. She was hospitalized at [facility] because of front headache which was attributed to an aneurysm. 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 She required a more than two month hospitalization with tracheostomy and PEG tube placement and was eventually discharged to SNF with guarded prognosis, inability to communicate or follow commands and quadriplegia. She has been at SNF ever since except for a hospitalization for tracheitis in 04/2014 and for hypernatremia and C. Diff diarrhea in 03/2015. She was brought to the hospital for increased drowsiness and decreased PO intake. In the ED a CT scan of the head was performed and was negative for acute findings. She was also found to have  hypernatremia to 171 with hyperchloremia of > 130.Most recent MBS in  09/2014 recommended Dys 3 diet and nectar thick liquids. She has been on Dys 1 diet and nectar thick liquids at SNF per MD note, however has had difficulty maintaining hydration and son is now requesting consideration of thin liquids.   Pertinent Vitals Pain Assessment: Faces Faces Pain Scale: No hurt  SLP Plan  Continue with current plan of care    Recommendations Diet recommendations: Thin liquid;Dysphagia 1 (puree) Liquids provided via: Straw;Cup Medication Administration: Crushed with puree (or with liquids*crushed ) Supervision: Patient able to self feed;Full supervision/cueing for compensatory strategies Compensations: Slow rate;Small sips/bites;Check for pocketing Postural Changes and/or Swallow Maneuvers: Seated upright 90 degrees;Upright 30-60 min after meal              Oral Care Recommendations: Oral care before and after PO Follow up Recommendations: Other (comment) (tbd) Plan: Continue with current plan of care    GO     Donavan Burnet, MS [facility] SLP [phone]

## 2015-06-03 NOTE — Care Management Note (Signed)
Case Management Note  Patient Details  Name: [name] MRN: [id number] Date of Birth: [birth year]  Subjective/Objective: Transfer from SDU. Hypernatremia, for picc.From SNF, but noted son wants new SNF.CSW following.                   Action/Plan:d/c plan SNF.   Expected Discharge Date:   (unknown)               Expected Discharge Plan:  Skilled Nursing Facility  In-House Referral:  Clinical Social Work  Discharge planning Services  CM Consult  Post Acute Care Choice:  NA Choice offered to:  NA  DME Arranged:  N/A DME Agency:  NA  HH Arranged:  NA HH Agency:  NA  Status of Service:  In process, will continue to follow  Medicare Important Message Given:    Date Medicare IM Given:    Medicare IM give by:    Date Additional Medicare IM Given:    Additional Medicare Important Message give by:     If discussed at Long Length of Stay Meetings, dates discussed:    Additional Comments:  Lanier Clam, RN 06/03/2015, 2:16 PM

## 2015-06-03 NOTE — Progress Notes (Signed)
Report received from R. Corcoran, RN. No change from initial pm assessment. Will continue to monitor and follow the POC.  

## 2015-06-03 NOTE — Clinical Social Work Placement (Signed)
   CLINICAL SOCIAL WORK PLACEMENT  NOTE  Date:  06/03/2015  Patient Details  Name: [name] MRN: [id number] Date of Birth: 1966-05-07  Clinical Social Work is seeking post-discharge placement for this patient at the Skilled  Nursing Facility level of care (*CSW will initial, date and re-position this form in  chart as items are completed):  Yes   Patient/family provided with [facility] Department's list of facilities offering this level of care within the geographic area requested by the patient (or if unable, by the patient's family).  Yes   Patient/family informed of their freedom to choose among providers that offer the needed level of care, that participate in Medicare, Medicaid or managed care program needed by the patient, have an available bed and are willing to accept the patient.  Yes   Patient/family informed of [facility]'s ownership interest in [facility] and [facility], as well as of the fact that they are under no obligation to receive care at these facilities.  PASRR submitted to EDS on 06/03/15     PASRR number received on 06/03/15     Existing PASRR number confirmed on       FL2 transmitted to all facilities in geographic area requested by pt/family on 06/03/15     FL2 transmitted to all facilities within larger geographic area on       Patient informed that his/her managed care company has contracts with or will negotiate with certain facilities, including the following:        Yes   Patient/family informed of bed offers received.  Patient chooses bed at       Physician recommends and patient chooses bed at      Patient to be transferred to   on  .  Patient to be transferred to facility by       Patient family notified on   of transfer.  Name of family member notified:        PHYSICIAN       Additional Comment:    _______________________________________________ Arlyss Repress, LCSW 06/03/2015, 2:39 PM

## 2015-06-04 DIAGNOSIS — R569 Unspecified convulsions: Secondary | ICD-10-CM

## 2015-06-04 LAB — BASIC METABOLIC PANEL
ANION GAP: 7 (ref 5–15)
BUN: 9 mg/dL (ref 6–20)
CALCIUM: 8.7 mg/dL — AB (ref 8.9–10.3)
CO2: 24 mmol/L (ref 22–32)
Chloride: 125 mmol/L — ABNORMAL HIGH (ref 101–111)
Creatinine, Ser: 0.63 mg/dL (ref 0.44–1.00)
Glucose, Bld: 108 mg/dL — ABNORMAL HIGH (ref 65–99)
Potassium: 3.7 mmol/L (ref 3.5–5.1)
SODIUM: 156 mmol/L — AB (ref 135–145)

## 2015-06-04 MED ORDER — METOPROLOL TARTRATE 50 MG PO TABS
100.0000 mg | ORAL_TABLET | Freq: Two times a day (BID) | ORAL | Status: DC
  Administered 2015-06-04 – 2015-06-06 (×5): 100 mg via ORAL
  Filled 2015-06-04 (×5): qty 2

## 2015-06-04 MED ORDER — SODIUM CHLORIDE 0.9 % IJ SOLN
10.0000 mL | INTRAMUSCULAR | Status: DC | PRN
  Administered 2015-06-05 – 2015-06-06 (×2): 10 mL
  Filled 2015-06-04 (×2): qty 40

## 2015-06-04 NOTE — Progress Notes (Addendum)
[facility] PROGRESS NOTE  Jovana Rembold TDH:[id number] DOB: [birth year] DOA: 06/01/2015 PCP: Arnette Norris, MD  Brief Summary  Jody Aguinaga is a 49 y.o. female with complicated medical history.  She was hospitalized at [facility] because of front headache which was attributed to an aneurysm.  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  She required a more than two month hospitalization with tracheostomy and PEG tube placement and was eventually discharged to SNF with guarded prognosis, inability to communicate or follow commands and quadriplegia.  She has been at SNF ever since except for a hospitalization for tracheitis in 04/2014 and for hypernatremia and C. Diff diarrhea in 03/2015.  She was brought to the hospital for increased drowsiness and decreased PO intake.  In the ED a CT scan of the head was performed and was negative for acute findings. She was also found to have hypernatremia to 171 with hyperchloremia of > 130.   Assessment/Plan  Hypernatremia, second admission in last 2-3 months for the same.  I suspect that she will need feeding tube replaced.  Sodium is trending down, however she is still eating very little, 120-180 mL's per day recorded on flow sheet. This may improve as her sodium returns to normal however. 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  At baseline, she can smile, make eye contact, give kisses, but she is nonverbal and quadriplegic and does not always follow commands.  Epilepsy with complex partial seizures, does not appear to be in status epilepticus -  Continue Keppra, Vimpat, and phenytoin -  Although her phenytoin level was low, this may have been secondary to taking cholestyramine for an unknown indication. I have stopped her cholestyramine and therefore her phenytoin levels may increase even without a dose adjustment. Plan to discharge on the same dose of phenytoin as before.  Hypertension, blood pressure stable to mildly elevated -  Continue to hold clonidine and minoxidil for now -  Resume metoprolol  Chronic thrombocytopenia, likely related to her antiepileptics, platelet stable near 100,000  Stage II sacral decubitus ulcer and a tracking wound on the right lower buttock -  appreciate Wound care assistance -  Air mattress  Reviewed records regarding cholestyramine which seems to have been added sometime between 02/2014 and 03/2014.  Not a first line medication for cholesterol and patient not having diarrhea, so will stop for now and monitor only.    Diet:  Dysphagia 1 with thin liquids Access:  PICC line on 7/29 IVF:  Yes Proph:  Lovenox  Code Status: Full code Family Communication: Patient and her son by phone Disposition Plan: Son states that he would like to her to go to [facility] and does not want to go back to her previous  skilled nursing facility  Consultants:  None  Procedures:  CT head  Antibiotics:  None   HPI/Subjective:  Patient is nonverbal, eyes open today.    Objective: Filed Vitals:   06/03/15 1718 06/03/15 2227 06/04/15 0138 06/04/15 0630  BP: 119/96 144/104 133/92 132/89  Pulse: 113 98 84 65  Temp: 98.5 F (36.9 C) 99.7 F (37.6 C) 98.1 F (36.7 C) 98.9 F (37.2 C)   TempSrc: Oral Oral Axillary Axillary  Resp: 20 22 20 22   Height:      Weight:      SpO2: 98% 90% 99% 100%    Intake/Output Summary (Last 24 hours) at 06/04/15 1121 Last data filed at 06/04/15 0937  Gross per 24 hour  Intake 1866.67 ml  Output      0 ml  Net 1866.67 ml   Filed Weights   06/01/15 2000  Weight: 83.8 kg (184 lb 11.9 oz)   Body mass index is 31.7 kg/(m^2).  Exam:   General:  Adult female, eyes open, No acute distress  HEENT:  NCAT, MMM  Cardiovascular:  RRR, nl S1, S2 no mrg, 2+ pulses, warm extremities  Respiratory:  CTAB, no increased WOB  Abdomen:   NABS, soft, NT/ND  MSK:   Decreased tone and bulk, trace bilateral LEE  Neuro:  Quadriplegic  Data Reviewed: Basic Metabolic Panel:  Recent Labs Lab 06/03/15 0315 06/03/15 0700 06/03/15 1010 06/03/15 2130 06/04/15 0503  NA 161* 159* 157* 155* 156*  K 2.9* 3.0* 3.2* 3.8 3.7  CL 129* 128* 128* 125* 125*  CO2 26 26 25 24 24   GLUCOSE 101* 112* 125* 111* 108*  BUN 12 11 11 10 9   CREATININE 0.72 0.74 0.68 0.61 0.63  CALCIUM 9.3 8.9 9.1 8.8* 8.7*  MG  --  2.1  --   --   --   PHOS  --  3.7  --   --   --    Liver Function Tests:  Recent Labs Lab 06/01/15 1703  AST 30  ALT 41  ALKPHOS 133*  BILITOT 0.7  PROT 7.2  ALBUMIN 3.6   No results for input(s): LIPASE, AMYLASE in the last 168 hours. No results for input(s): AMMONIA in the last 168 hours. CBC:  Recent Labs Lab 06/01/15 1703 06/02/15 0355 06/03/15 1010  WBC 5.3 4.8 4.4  NEUTROABS 2.2  --   --   HGB 12.7 12.8 13.4  HCT 42.2 42.2 41.7  MCV 96.3 98.4 93.1  PLT 101* 101* 94*    Recent Results (from the past 240 hour(s))  MRSA PCR Screening     Status: Abnormal   Collection Time: 06/01/15  8:23 PM  Result Value Ref Range Status   MRSA by PCR POSITIVE (A) NEGATIVE Final    Comment:        The GeneXpert MRSA Assay (FDA approved for NASAL specimens only), is one component of a comprehensive MRSA colonization surveillance  program. It is not intended to diagnose MRSA infection nor to guide or monitor treatment for MRSA infections. RESULT CALLED TO, READ BACK BY AND VERIFIED WITH: Doristine Devoid RN @ [phone] ON 06/01/15 BY C DAVIS      Studies: Ir Fluoro Guide Cv Line Right  06/03/2015   INDICATION: Hypernatremia, encephalopathy, prior CVA, poor IV access ; request is made for central venous access for fluids and medications.  EXAM: ULTRASOUND AND FLUOROSCOPIC GUIDED PICC LINE INSERTION  MEDICATIONS: None.  CONTRAST:  None  FLUOROSCOPY TIME:  66 seconds.  COMPLICATIONS:  None immediate  TECHNIQUE: The procedure, risks, benefits, and alternatives were explained to the patient's son and informed written consent was obtained. A timeout was performed prior to the initiation of the procedure.  The right upper extremity was prepped with chlorhexidine in a sterile fashion, and a sterile drape was applied covering the operative field. Maximum barrier sterile technique with sterile gowns and gloves were used for the procedure. A timeout was performed prior to the initiation of the procedure. Local anesthesia was provided with 1% lidocaine.  Under direct ultrasound guidance, the right basilic vein was accessed with a micropuncture kit after the overlying soft tissues were anesthetized with 1% lidocaine. An ultrasound image was saved for documentation purposes. A guidewire was advanced to the level of the superior caval-atrial junction for measurement purposes and the PICC line was cut to length. A peel-away sheath was placed and a 42 cm, 5 [place], dual lumen was inserted to level of the superior caval-atrial junction. A post procedure spot fluoroscopic was obtained. The catheter easily aspirated and flushed and was sutured in place. A dressing was placed. The patient tolerated the procedure well without immediate post procedural complication.  FINDINGS: After catheter placement, the tip lies within the superior cavoatrial junction. The catheter  aspirates and flushes normally and is ready for immediate use.  IMPRESSION: Successful ultrasound and fluoroscopic guided placement of a right basilic vein approach, 42 cm, 5 French, dual lumen PICC with tip at the superior caval-atrial junction. The PICC line is ready for immediate use.  Read by: Rowe Robert, PA-C   Electronically Signed   By: Sandi Mariscal M.D.   On: 06/03/2015 17:22   Ir US Guide Vasc Access Right  06/03/2015   INDICATION: Hypernatremia, encephalopathy, prior CVA, poor IV access ; request is made for central venous access for fluids and medications.  EXAM: ULTRASOUND AND FLUOROSCOPIC GUIDED PICC LINE INSERTION  MEDICATIONS: None.  CONTRAST:  None  FLUOROSCOPY TIME:  66 seconds.  COMPLICATIONS: None immediate  TECHNIQUE: The procedure, risks, benefits, and alternatives were explained to the patient's son and informed written consent was obtained. A timeout was performed prior to the initiation of the procedure.  The right upper extremity was prepped with chlorhexidine in a sterile fashion, and a sterile drape was applied covering the operative field. Maximum barrier sterile technique with sterile gowns and gloves were used for the procedure. A timeout was performed prior to the initiation of the procedure. Local anesthesia was provided with 1% lidocaine.  Under direct ultrasound guidance, the right basilic vein was accessed with a micropuncture kit after the overlying soft tissues were anesthetized with 1% lidocaine. An ultrasound image was saved for documentation purposes. A guidewire was advanced to the level of the superior caval-atrial junction for measurement purposes and the PICC line was cut to length. A peel-away sheath was placed and a 42 cm, 5 [place], dual lumen was inserted to level of the superior caval-atrial junction. A post procedure spot fluoroscopic was obtained. The catheter easily aspirated and flushed and was sutured in place. A dressing was placed. The patient tolerated the  procedure well without immediate post procedural complication.  FINDINGS: After catheter placement, the tip lies within the superior cavoatrial junction. The catheter aspirates and flushes normally and is ready for immediate use.  IMPRESSION: Successful ultrasound and fluoroscopic guided placement of a right basilic vein approach, 42 cm, 5 French, dual lumen PICC with tip at the superior caval-atrial junction. The PICC line is ready for immediate use.  Read by: Rowe Robert, PA-C   Electronically Signed   By: Sandi Mariscal M.D.   On: 06/03/2015 17:22   Dg Chest Port 1 View  06/03/2015   CLINICAL DATA:  Hypoxia  EXAM: PORTABLE CHEST - 1 VIEW  COMPARISON:  June 01, 2015  FINDINGS: There is slight left base atelectasis. There is no edema or consolidation. Heart size and pulmonary vascularity are within normal limits. No adenopathy. There are no focal bone lesions.  IMPRESSION: Mild left base atelectasis.  No edema or consolidation.   Electronically Signed   By: Lowella Grip III M.D.   On: 06/03/2015 11:39   Dg Swallowing Func-speech Pathology  06/03/2015    Objective Swallowing Evaluation:    Patient Details  Name: Emryn Flanery MRN: [id number] Date of Birth: September 22, 1966  Today's Date: 06/03/2015 Time: SLP Start Time (ACUTE ONLY): 0859-SLP Stop Time (ACUTE ONLY): 0935 SLP Time Calculation (min) (ACUTE ONLY): 36 min  Past Medical History:  Past Medical History  Diagnosis Date  . Hypertension   . SAH (subarachnoid hemorrhage)   . Seizure   . Stroke   . Tracheostomy status   . Tracheomalacia   . DVT (deep venous thrombosis)    Past Surgical History:  Past Surgical History  Procedure Laterality Date  . Abdominal hysterectomy    . Cholecystectomy     HPI:  Other Pertinent Information: Dyanna Seiter is a 49 y.o. female with  complicated medical history. She was hospitalized at [facility] because of  front headache which was attributed to an aneurysm. During her clipping  procedure, she had rupture of the aneurysm  resulting in devastating  subarachnoid hemorrhage, ischemic strokes, and seizures. She required a  more than two month hospitalization with tracheostomy and PEG tube  placement and was eventually discharged to SNF with guarded prognosis,  inability to communicate or follow commands and quadriplegia. She has  been at SNF ever since except for a hospitalization for tracheitis in  04/2014 and for hypernatremia and C. Diff diarrhea in 03/2015. She was  brought to the hospital for increased drowsiness and decreased PO intake.  In the ED a CT scan of the head was performed and was negative for acute  findings. She was also found to have hypernatremia to 171 with  hyperchloremia of > 130.Most recent MBS in  09/2014 recommended Dys 3  diet and nectar thick liquids. She has been on Dys 1 diet and nectar thick  liquids at SNF per MD note, however has had difficulty maintaining  hydration and son is now requesting consideration of thin liquids.  No Data Recorded  Assessment / Plan / Recommendation CHL IP CLINICAL IMPRESSIONS 06/03/2015  Therapy Diagnosis Severe oral phase dysphagia;Moderate oral phase  dysphagia;Mild cervical esophageal phase dysphagia;Mild pharyngeal phase  dysphagia  Clinical Impression  Pt presents with moderately severe oral and mild pharyngeal dysphagia  without aspiration of any consistency tested.  Oral transiting is very  delayed and discoordinated resulting in premature spillage into pharynx  and oral residuals.  Pharyngeal swallow was overall strong albeit mildly  delayed.  Minimal residuals noted with liquids more than solids.  Trace  laryngeal penetration of nectar noted that cleared with further swallows  due to adequacy of laryngeal closure.  Backflow of liquids from proximal  esophagus into pharynx noted without pt adequate awareness.  Suspect  component of esophageal dysphagia that may be consistent with dysmotility  given appearance of delayed clearance and backflow.    Unfortunately, due  to  pt's motor planning deficits she is unable to  cough/throat clear or dry swallow on command.   Due to these factors and  dysphagia, pt remains an aspiration/malnutrition risk. However, she did  AUDIBLY aspirate on MBS in 2015 and therefore feasible consideration is to  advance liquids to thin and monitor tolerance closely.  Highly recommend  follow up at SNF for tolerance of po diet and adjustments as needed.         CHL IP TREATMENT RECOMMENDATION 06/03/2015  Treatment Recommendations Therapy as outlined in treatment plan below     CHL IP DIET RECOMMENDATION 06/03/2015  SLP Diet Recommendations Thin;Dysphagia 1 (Puree)  Liquid Administration via Cup, straw  Medication Administration Crushed with puree  Compensations Slow rate;Small sips/bites;Check for pocketing  Postural Changes and/or Swallow Maneuvers Assure stays upright     CHL IP OTHER RECOMMENDATIONS 06/03/2015  Recommended Consults (None)  Oral Care Recommendations Oral care BID  Other Recommendations (None)     CHL IP FOLLOW UP RECOMMENDATIONS 03/22/2015  Follow up Recommendations Skilled Nursing facility     [facility] IP FREQUENCY AND DURATION 06/03/2015  Speech Therapy Frequency (ACUTE ONLY) min 2x/week  Treatment Duration 1 week         CHL IP REASON FOR REFERRAL 06/03/2015  Reason for Referral Objectively evaluate swallowing function     CHL IP ORAL PHASE 06/03/2015  Oral Phase Impaired      CHL IP PHARYNGEAL PHASE 06/03/2015  Pharyngeal Phase Impaired      CHL IP CERVICAL ESOPHAGEAL PHASE 06/03/2015  Cervical Esophageal Phase Impaired  Nectar Teaspoon Reduced cricopharyngeal relaxation  Nectar Cup Reduced cricopharyngeal relaxation  Nectar Straw Reduced cricopharyngeal relaxation  Nectar Sippy Cup (None)  Thin Teaspoon Reduced cricopharyngeal relaxation  Thin Cup Reduced cricopharyngeal relaxation;Esophageal backflow into the  pharynx  Thin Straw Reduced cricopharyngeal relaxation;Esophageal backflow into the  pharynx  Thin Sippy Cup (None)  Cervical Esophageal  Comment appearance of decreased clearance of esophagus  - backflow of liquid from proximal esophagus noted into pharynx without pt  overt sensation= awareness    CHL IP GO 09/22/2014  Functional Assessment Tool Used skilled clinical judgement  Functional Limitations Swallowing  Swallow Current Status (L2751) CK  Swallow Goal Status (Z0017) CK  Swallow Discharge Status (C9449) CK           Luanna Salk, MS [facility] SLP [phone]     Scheduled Meds: . Chlorhexidine Gluconate Cloth  6 each Topical Q0600  . enoxaparin (LOVENOX) injection  40 mg Subcutaneous Q24H  . famotidine (PEPCID) IV  20 mg Intravenous BID  . feeding supplement (ENSURE ENLIVE)  237 mL Oral BID BM  . lacosamide (VIMPAT) IV  100 mg Intravenous Q12H  . levETIRAcetam  1,500 mg Intravenous BID  . mupirocin ointment  1 application Nasal BID  . phenytoin (DILANTIN) IV  100 mg Intravenous Q8H   Continuous Infusions: . dextrose 5 % with KCl 20 mEq / L 20 mEq (06/04/15 0325)    Principal Problem:   Hypernatremia Active Problems:   HTN (hypertension)   Seizures   Localization-related symptomatic epilepsy and epileptic syndromes with complex partial seizures, not intractable, without status epilepticus   Hx of ischemic right ACA stroke   History of ischemic left ACA stroke   Acute encephalopathy   Pressure ulcer    Time spent: 30 min    Cieara Stierwalt, [facility] Pager [phone]. If 7PM-7AM, please contact night-coverage at [email or web address], password [email or web address] 06/04/2015, 11:21 AM  LOS: 3 days

## 2015-06-05 LAB — BASIC METABOLIC PANEL
Anion gap: 6 (ref 5–15)
BUN: 6 mg/dL (ref 6–20)
CO2: 22 mmol/L (ref 22–32)
Calcium: 8.4 mg/dL — ABNORMAL LOW (ref 8.9–10.3)
Chloride: 115 mmol/L — ABNORMAL HIGH (ref 101–111)
Creatinine, Ser: 0.53 mg/dL (ref 0.44–1.00)
GFR calc non Af Amer: >60 mL/min (ref >60)
Glucose, Bld: 143 mg/dL — ABNORMAL HIGH (ref 65–99)
POTASSIUM: 3.4 mmol/L — AB (ref 3.5–5.1)
SODIUM: 143 mmol/L (ref 135–145)

## 2015-06-05 MED ORDER — CLONIDINE HCL 0.2 MG/24HR TD PTWK
0.2000 mg | MEDICATED_PATCH | TRANSDERMAL | Status: DC
  Administered 2015-06-05: 0.2 mg via TRANSDERMAL
  Filled 2015-06-05: qty 1

## 2015-06-05 MED ORDER — MINOXIDIL 2.5 MG PO TABS
2.5000 mg | ORAL_TABLET | Freq: Two times a day (BID) | ORAL | Status: DC
  Administered 2015-06-05 – 2015-06-06 (×2): 2.5 mg via ORAL
  Filled 2015-06-05 (×2): qty 1

## 2015-06-05 MED ORDER — KCL IN DEXTROSE-NACL 20-5-0.45 MEQ/L-%-% IV SOLN
INTRAVENOUS | Status: DC
  Administered 2015-06-05 (×2): via INTRAVENOUS
  Filled 2015-06-05 (×3): qty 1000

## 2015-06-05 NOTE — Progress Notes (Signed)
Spoke with Dr. Malachi Bonds re:Pt's elevated B/P's and that pt was found to be soaking wet from urine incontinence. Linens were changed and peri care was given. Pain med was given 1 hour ago. Will repeat B/P. See Flowsheet. Delford Field, RN

## 2015-06-05 NOTE — Progress Notes (Signed)
[facility] PROGRESS NOTE  Nury Nebergall OBS:[id number] DOB: 05-24-1966 DOA: 06/01/2015 PCP: Arnette Norris, MD  Brief Summary  [name] is a 49 y.o. female with complicated medical history.  She was hospitalized at [facility] because of front headache which was attributed to an aneurysm.  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  She required a more than two month hospitalization with tracheostomy and PEG tube placement and was eventually discharged to SNF with guarded prognosis, inability to communicate or follow commands and quadriplegia.  She has been at SNF ever since except for a hospitalization for tracheitis in 04/2014 and for hypernatremia and C. Diff diarrhea in 03/2015.  She was brought to the hospital for increased drowsiness and decreased PO intake.  In the ED a CT scan of the head was performed and was negative for acute findings. She was also found to have hypernatremia to 171 with hyperchloremia of > 130.   Assessment/Plan  Hypernatremia, second admission in last 2-3 months for the same.  I suspect that she either needs her diet liberalized as she has been given dysphagia 1 with nectar thick liquids OR she needs a feeding tube replaced.  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 At baseline, she can smile, make eye contact, give kisses, but she is nonverbal and quadriplegic and does not always follow  commands.  Epilepsy with complex partial seizures, does not appear to be in status epilepticus -  Continue Keppra, Vimpat, and phenytoin - Although her phenytoin level was low, this may have been secondary to taking cholestyramine for an unknown indication. I have stopped her cholestyramine and therefore her phenytoin levels may increase even without a dose adjustment. Plan to discharge on the same dose of phenytoin as before.  Hypertension, current hypotensive -  Continue metoprolol -  Start Clonidine patch  Chronic thrombocytopenia, likely related to her antiepileptics, platelet stable near 100,000  Stage II sacral decubitus ulcer and a tracking wound on the right lower buttock - appreciate Wound care assistance - Air mattress  Diet:  Dysphagia 1 with thin liquids Access:  PICC IVF:  Yes Proph:  Lovenox  Code Status: Full code Family Communication: Patient alone Disposition Plan: [facility] to [facility] skilled nursing facility.  Plan to d/c once we see if she needs a feeding tube placed.  PO increased slightly yesterday to 2410m   Consultants:  None  Procedures:  CT head  Antibiotics:  None   HPI/Subjective:  Patient is nonverbal.    Objective: Filed Vitals:   06/04/15 1357 06/04/15 2230 06/05/15 0132 06/05/15 0614  BP: 137/99 168/136 151/109 153/109  Pulse: 97 88 64 77  Temp: 99 F (37.2 C) 98.8 F (37.1 C) 97.5 F (36.4 C) 97.7 F (36.5 C)  TempSrc:  Axillary Axillary Axillary  Resp: 22 16 20  22  Height:      Weight:      SpO2: 97% 96% 98% 96%    Intake/Output Summary (Last 24 hours) at 06/05/15 1156 Last data filed at 06/05/15 0600  Gross per 24 hour  Intake   1820 ml  Output      0 ml  Net   1820 ml   Filed Weights   06/01/15 2000  Weight: 83.8 kg (184 lb 11.9 oz)   Body mass index is 31.7 kg/(m^2).  Exam:   General:  Adult female, eyes open, No acute  distress  HEENT:  NCAT, MMM  Cardiovascular:  RRR, nl S1, S2 no mrg, 2+ pulses, warm extremities  Respiratory:  CTAB, no increased WOB  Abdomen:   NABS, soft, NT/ND  MSK:   Normal tone and bulk, no LEE  Neuro:  Grossly intact  Data Reviewed: Basic Metabolic Panel:  Recent Labs Lab 06/03/15 0700 06/03/15 1010 06/03/15 2130 06/04/15 0503 06/05/15 0415  NA 159* 157* 155* 156* 143  K 3.0* 3.2* 3.8 3.7 3.4*  CL 128* 128* 125* 125* 115*  CO2 26 25 24 24 22   GLUCOSE 112* 125* 111* 108* 143*  BUN 11 11 10 9 6   CREATININE 0.74 0.68 0.61 0.63 0.53  CALCIUM 8.9 9.1 8.8* 8.7* 8.4*  MG 2.1  --   --   --   --   PHOS 3.7  --   --   --   --    Liver Function Tests:  Recent Labs Lab 06/01/15 1703  AST 30  ALT 41  ALKPHOS 133*  BILITOT 0.7  PROT 7.2  ALBUMIN 3.6   No results for input(s): LIPASE, AMYLASE in the last 168 hours. No results for input(s): AMMONIA in the last 168 hours. CBC:  Recent Labs Lab 06/01/15 1703 06/02/15 0355 06/03/15 1010  WBC 5.3 4.8 4.4  NEUTROABS 2.2  --   --   HGB 12.7 12.8 13.4  HCT 42.2 42.2 41.7  MCV 96.3 98.4 93.1  PLT 101* 101* 94*    Recent Results (from the past 240 hour(s))  MRSA PCR Screening     Status: Abnormal   Collection Time: 06/01/15  8:23 PM  Result Value Ref Range Status   MRSA by PCR POSITIVE (A) NEGATIVE Final    Comment:        The GeneXpert MRSA Assay (FDA approved for NASAL specimens only), is one component of a comprehensive MRSA colonization surveillance program. It is not intended to diagnose MRSA infection nor to guide or monitor treatment for MRSA infections. RESULT CALLED TO, READ BACK BY AND VERIFIED WITH: Doristine Devoid RN @ 0100 ON 06/01/15 BY C DAVIS      Studies: Ir Fluoro Guide Cv Line Right  06/03/2015   INDICATION: Hypernatremia, encephalopathy, prior CVA, poor IV access ; request is made for central venous access for fluids and medications.  EXAM: ULTRASOUND AND FLUOROSCOPIC GUIDED PICC LINE  INSERTION  MEDICATIONS: None.  CONTRAST:  None  FLUOROSCOPY TIME:  66 seconds.  COMPLICATIONS: None immediate  TECHNIQUE: The procedure, risks, benefits, and alternatives were explained to the patient's son and informed written consent was obtained. A timeout was performed prior to the initiation of the procedure.  The right upper extremity was prepped with chlorhexidine in a sterile fashion, and a sterile drape was applied covering the operative field. Maximum barrier sterile technique with sterile gowns and gloves were used for the procedure. A timeout was performed prior to the initiation of the procedure.  Local anesthesia was provided with 1% lidocaine.  Under direct ultrasound guidance, the right basilic vein was accessed with a micropuncture kit after the overlying soft tissues were anesthetized with 1% lidocaine. An ultrasound image was saved for documentation purposes. A guidewire was advanced to the level of the superior caval-atrial junction for measurement purposes and the PICC line was cut to length. A peel-away sheath was placed and a 42 cm, 5 [place], dual lumen was inserted to level of the superior caval-atrial junction. A post procedure spot fluoroscopic was obtained. The catheter easily aspirated and flushed and was sutured in place. A dressing was placed. The patient tolerated the procedure well without immediate post procedural complication.  FINDINGS: After catheter placement, the tip lies within the superior cavoatrial junction. The catheter aspirates and flushes normally and is ready for immediate use.  IMPRESSION: Successful ultrasound and fluoroscopic guided placement of a right basilic vein approach, 42 cm, 5 French, dual lumen PICC with tip at the superior caval-atrial junction. The PICC line is ready for immediate use.  Read by: Rowe Robert, PA-C   Electronically Signed   By: Sandi Mariscal M.D.   On: 06/03/2015 17:22   Ir US Guide Vasc Access Right  06/03/2015   INDICATION: Hypernatremia,  encephalopathy, prior CVA, poor IV access ; request is made for central venous access for fluids and medications.  EXAM: ULTRASOUND AND FLUOROSCOPIC GUIDED PICC LINE INSERTION  MEDICATIONS: None.  CONTRAST:  None  FLUOROSCOPY TIME:  66 seconds.  COMPLICATIONS: None immediate  TECHNIQUE: The procedure, risks, benefits, and alternatives were explained to the patient's son and informed written consent was obtained. A timeout was performed prior to the initiation of the procedure.  The right upper extremity was prepped with chlorhexidine in a sterile fashion, and a sterile drape was applied covering the operative field. Maximum barrier sterile technique with sterile gowns and gloves were used for the procedure. A timeout was performed prior to the initiation of the procedure. Local anesthesia was provided with 1% lidocaine.  Under direct ultrasound guidance, the right basilic vein was accessed with a micropuncture kit after the overlying soft tissues were anesthetized with 1% lidocaine. An ultrasound image was saved for documentation purposes. A guidewire was advanced to the level of the superior caval-atrial junction for measurement purposes and the PICC line was cut to length. A peel-away sheath was placed and a 42 cm, 5 [place], dual lumen was inserted to level of the superior caval-atrial junction. A post procedure spot fluoroscopic was obtained. The catheter easily aspirated and flushed and was sutured in place. A dressing was placed. The patient tolerated the procedure well without immediate post procedural complication.  FINDINGS: After catheter placement, the tip lies within the superior cavoatrial junction. The catheter aspirates and flushes normally and is ready for immediate use.  IMPRESSION: Successful ultrasound and fluoroscopic guided placement of a right basilic vein approach, 42 cm, 5 French, dual lumen PICC with tip at the superior caval-atrial junction. The PICC line is ready for immediate use.  Read  by: Rowe Robert, PA-C   Electronically Signed   By: Sandi Mariscal M.D.   On: 06/03/2015 17:22    Scheduled Meds: . Chlorhexidine Gluconate Cloth  6 each Topical Q0600  . enoxaparin (LOVENOX) injection  40 mg Subcutaneous Q24H  . famotidine (PEPCID) IV  20 mg Intravenous BID  . feeding supplement (ENSURE ENLIVE)  237 mL Oral BID BM  . lacosamide (VIMPAT) IV  100 mg Intravenous Q12H  . levETIRAcetam  1,500 mg Intravenous BID  . metoprolol tartrate  100 mg Oral BID  . mupirocin ointment  1 application Nasal BID  . phenytoin (DILANTIN) IV  100 mg Intravenous Q8H   Continuous Infusions: . dextrose 5 % and 0.45 % NaCl with KCl 20 mEq/L 75 mL/hr at 06/05/15 3536    Principal Problem:   Hypernatremia Active Problems:   HTN (hypertension)   Seizures   Localization-related symptomatic epilepsy and epileptic syndromes with complex partial seizures, not intractable, without status epilepticus   Hx of ischemic right ACA stroke   History of ischemic left ACA stroke   Acute encephalopathy   Pressure ulcer    Time spent: 30 min    Akirra Lacerda, [facility] Pager [phone]. If 7PM-7AM, please contact night-coverage at [email or web address], password [facility] 06/05/2015, 11:56 AM  LOS: 4 days

## 2015-06-06 ENCOUNTER — Telehealth: Payer: Self-pay | Admitting: *Deleted

## 2015-06-06 LAB — CBC
HEMATOCRIT: 38.1 % (ref 36.0–46.0)
HEMOGLOBIN: 12.8 g/dL (ref 12.0–15.0)
MCH: 29.6 pg (ref 26.0–34.0)
MCHC: 33.6 g/dL (ref 30.0–36.0)
MCV: 88.2 fL (ref 78.0–100.0)
PLATELETS: 94 10*3/uL — AB (ref 150–400)
RBC: 4.32 MIL/uL (ref 3.87–5.11)
RDW: 13.6 % (ref 11.5–15.5)
WBC: 6.3 10*3/uL (ref 4.0–10.5)

## 2015-06-06 LAB — BASIC METABOLIC PANEL
Anion gap: 6 (ref 5–15)
BUN: 5 mg/dL — ABNORMAL LOW (ref 6–20)
CALCIUM: 8.4 mg/dL — AB (ref 8.9–10.3)
CO2: 22 mmol/L (ref 22–32)
Chloride: 108 mmol/L (ref 101–111)
Creatinine, Ser: 0.49 mg/dL (ref 0.44–1.00)
GFR calc Af Amer: >60 mL/min (ref >60)
GLUCOSE: 120 mg/dL — AB (ref 65–99)
Potassium: 3.6 mmol/L (ref 3.5–5.1)
SODIUM: 136 mmol/L (ref 135–145)

## 2015-06-06 MED ORDER — LACOSAMIDE 50 MG PO TABS: 100.0000 mg | ORAL_TABLET | Freq: Two times a day (BID) | ORAL | Status: DC

## 2015-06-06 MED ORDER — ENSURE ENLIVE PO LIQD
237.0000 mL | Freq: Two times a day (BID) | ORAL | Status: DC
Start: 2015-06-06 — End: 2015-10-26

## 2015-06-06 MED ORDER — MINOXIDIL 10 MG PO TABS: 10.0000 mg | ORAL_TABLET | Freq: Two times a day (BID) | ORAL | Status: DC

## 2015-06-06 MED ORDER — LACOSAMIDE 50 MG PO TABS
100.0000 mg | ORAL_TABLET | Freq: Two times a day (BID) | ORAL | Status: DC
  Administered 2015-06-06: 100 mg via ORAL
  Filled 2015-06-06: qty 2

## 2015-06-06 MED ORDER — CLONIDINE HCL 0.3 MG/24HR TD PTWK: 0.3000 mg | MEDICATED_PATCH | TRANSDERMAL | Status: DC

## 2015-06-06 MED ORDER — LEVETIRACETAM 750 MG PO TABS: 1500.0000 mg | ORAL_TABLET | Freq: Two times a day (BID) | ORAL | Status: DC

## 2015-06-06 MED ORDER — PHENYTOIN SODIUM EXTENDED 100 MG PO CAPS: 200.0000 mg | ORAL_CAPSULE | Freq: Two times a day (BID) | ORAL | Status: DC

## 2015-06-06 NOTE — Clinical Social Work Placement (Signed)
Patient is set to discharge back to [facility] today. Patient & sister, [name] aware, sister will continue to check with [name] to see when they would have a long term Medicaid bed become available. Discharge packet given to RN, Boneta Lucks. [facility] called for transport.     Lincoln Maxin, LCSW [facility] Clinical Social Worker cell #: [phone]    CLINICAL SOCIAL WORK PLACEMENT  NOTE  Date:  06/06/2015  Patient Details  Name: [name] MRN: [id number] Date of Birth: Apr 01, 1966  Clinical Social Work is seeking post-discharge placement for this patient at the Skilled  Nursing Facility level of care (*CSW will initial, date and re-position this form in  chart as items are completed):  Yes   Patient/family provided with [facility] Clinical Social Work Department's list of facilities offering this level of care within the geographic area requested by the patient (or if unable, by the patient's family).  Yes   Patient/family informed of their freedom to choose among providers that offer the needed level of care, that participate in Medicare, Medicaid or managed care program needed by the patient, have an available bed and are willing to accept the patient.  Yes   Patient/family informed of [facility]'s ownership interest in [facility] and [facility], as well as of the fact that they are under no obligation to receive care at these facilities.  PASRR submitted to EDS on 06/03/15     PASRR number received on 06/03/15     Existing PASRR number confirmed on       FL2 transmitted to all facilities in geographic area requested by pt/family on 06/03/15     FL2 transmitted to all facilities within larger geographic area on       Patient informed that his/her managed care company has contracts with or will negotiate with certain facilities, including the following:        Yes   Patient/family informed of bed offers received.  Patient chooses  bed at [facility]     Physician recommends and patient chooses bed at      Patient to be transferred to [facility] on 06/06/15.  Patient to be transferred to facility by [facility]     Patient family notified on 06/06/15 of transfer.  Name of family member notified:  patient's sister, [name] via phone     PHYSICIAN       Additional Comment:    _______________________________________________ Arlyss Repress, LCSW 06/06/2015, 1:41 PM

## 2015-06-06 NOTE — Discharge Summary (Signed)
Physician Discharge Summary  [name] DHR:[id number] DOB: April 21, 1966 DOA: 06/01/2015  PCP: Arnette Norris, MD  Admit date: 06/01/2015 Discharge date: 06/06/2015  Recommendations for Outpatient Follow-up:  1.  Continue hand feeding patient, and encouraging water throughout the day. Please make sure that she drinks at least 800 mL of fluid per day. 2.  Repeat BMP in 1 week to recheck her sodium.  If her sodium is starting to rise again, recommend talking to the family about feeding tube.  D ischarge Diagnoses:  Principal Problem:   Hypernatremia Active Problems:   HTN (hypertension)   Seizures   Localization-related symptomatic epilepsy and epileptic syndromes with complex partial seizures, not intractable, without status epilepticus   Hx of ischemic right ACA stroke   History of ischemic left ACA stroke   Acute encephalopathy   Pressure ulcer   Discharge Condition: stable, improved  Diet recommendation:  dysphagia 1 with thin liquids, full supervision, okay to use straw, strict aspiration precautions  Wt Readings from Last 3 Encounters:  06/01/15 83.8 kg (184 lb 11.9 oz)  03/21/15 89.3 kg (196 lb 13.9 oz)  04/07/14 104.5 kg (230 lb 6.1 oz)    History of present illness:  [name] is a 49 y.o. female with complicated medical history. She was hospitalized at [facility] because of front headache which was attributed to an aneurysm. 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 She required a more than two month hospitalization with tracheostomy and PEG tube placement and was eventually discharged to SNF with guarded prognosis, inability to communicate or follow commands and quadriplegia. She has been at SNF ever since except for a hospitalization for tracheitis in 04/2014 and for hypernatremia and C. Diff diarrhea in 03/2015. She was brought to the hospital for increased drowsiness  and decreased PO intake. In the ED a CT scan of the head was performed and was negative for acute findings. She was also found to have hypernatremia to 171 with hyperchloremia of > 130.   Hospital Course:   Hypernatremia, second admission in last 2-3 months for the same. Discussed the possibility of a feeding tube with the son, however, he would like to avoid that procedure if at all possible. He understands the risk of aspiration however we liberalized her diet to dysphagia 1 with thin liquids. After correction of her hypernatremia with D5 W, her appetite and thirst improved and she was eating and drinking well hard to discharge. She was initially a little somnolent, however her mentation also improved after correction of her hypernatremia. She will need a repeat BMP in 1 week to check her sodium. If her sodium is starting to rise again, would recommend discussing the possibility of a feeding tube with the family versus palliative care.  Protein calorie malnutrition, PO increased dramatically over the last 2 days.  She is currently eating a dysphagia 1 with thin liquids diet. She was seen by speech therapy and by nutrition and is receiving supplements. She needs careful hand feeding with strict aspiration precautions.  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 At baseline, she can smile, make eye contact, give kisses, but she is nonverbal and quadriplegic and does not always follow commands.  Improved and was able to nod and shake her head to answer questions at the time of discharge, however I am unsure if this is very  reliable since each seems to change her answer to the same question when repeatedly asked.    Epilepsy with complex partial seizures, seizure free. She continued Keppra, Vimpat, and phenytoin. Her phenytoin level was low, however I did not increase her dose because I  stopped her cholestyramine.  Please monitor for sedation, and if this occurs I would recheck a phenytoin level and a BMP.  Hypertension, initially hypotensive, and her blood pressure medications were held. She restarted metoprolol and minoxidil. I transitioned her to a clonidine patch to decrease her pill burden.  Chronic thrombocytopenia, likely related to her antiepileptics, platelet stable near 100,000  Stage II sacral decubitus ulcer and a tracking wound on the right lower buttock. She was seen by wound care.  She has wet to dry dressing applied to her ischium with a foam dressing to protect the sacrum.  Recommend frequent turning, air mattress if available.  Consultants:  None  Procedures:  CT head  Antibiotics:  None  Discharge Exam: Filed Vitals:   06/06/15 0432  BP: 143/111  Pulse: 86  Temp: 98.6 F (37 C)  Resp: 24   Filed Vitals:   06/05/15 1909 06/05/15 2148 06/06/15 0136 06/06/15 0432  BP: 164/113 122/95 127/92 143/111  Pulse: 92 89 85 86  Temp:  97.8 F (36.6 C) 98.2 F (36.8 C) 98.6 F (37 C)  TempSrc:  Oral Oral Oral  Resp:  20 24 24   Height:      Weight:      SpO2:  100% 100% 99%     General: Adult female, eyes closed, No acute distress  HEENT: NCAT, MMM  Cardiovascular: RRR, nl S1, S2 no mrg, 2+ pulses, warm extremities  Respiratory: CTAB, no increased WOB  Abdomen: NABS, soft, NT/ND  MSK: Normal tone and bulk, trace bilateral LEE  Neuro: Quadriplegic, nonverbal. Able to nod and shake her head and smile, but answers questions inconsistently.  Discharge Instructions      Discharge Instructions    Call MD for:  difficulty breathing, headache or visual disturbances    Complete by:  As directed      Call MD for:  extreme fatigue    Complete by:  As directed      Call MD for:  hives    Complete by:  As directed      Call MD for:  persistant dizziness or light-headedness    Complete by:  As directed      Call MD for:   persistant nausea and vomiting    Complete by:  As directed      Call MD for:  severe uncontrolled pain    Complete by:  As directed      Call MD for:  temperature >100.4    Complete by:  As directed      Increase activity slowly    Complete by:  As directed             Medication List    STOP taking these medications        cholestyramine 4 G packet  Commonly known as:  QUESTRAN     cloNIDine 0.2 MG tablet  Commonly known as:  CATAPRES  Replaced by:  cloNIDine 0.3 mg/24hr patch     meloxicam 7.5 MG tablet  Commonly known as:  MOBIC      TAKE these medications        acetaminophen 325 MG tablet  Commonly known as:  TYLENOL  Take 2 tablets (650 mg total)  by mouth daily.     carboxymethylcellulose 0.5 % Soln  Commonly known as:  REFRESH PLUS  Place 2 drops into both eyes 3 (three) times daily.     cloNIDine 0.3 mg/24hr patch  Commonly known as:  CATAPRES - Dosed in mg/24 hr  Place 1 patch (0.3 mg total) onto the skin once a week.     famotidine 40 MG/5ML suspension  Commonly known as:  PEPCID  Take 2.5 mLs (20 mg total) by mouth every 12 (twelve) hours.     feeding supplement (ENSURE ENLIVE) Liqd  Take 237 mLs by mouth 2 (two) times daily between meals.     lacosamide 50 MG Tabs tablet  Commonly known as:  VIMPAT  Take 2 tablets (100 mg total) by mouth 2 (two) times daily.     levETIRAcetam 750 MG tablet  Commonly known as:  KEPPRA  Take 1,500 mg by mouth 2 (two) times daily.     metoprolol 100 MG tablet  Commonly known as:  LOPRESSOR  Take 1 tablet (100 mg total) by mouth 2 (two) times daily.     minoxidil 10 MG tablet  Commonly known as:  LONITEN  Take 1 tablet (10 mg total) by mouth 2 (two) times daily.     multivitamin with minerals Tabs tablet  Take 1 tablet by mouth daily with breakfast.     phenytoin 100 MG ER capsule  Commonly known as:  DILANTIN  Take 200 mg by mouth 2 (two) times daily.     traZODone 50 MG tablet  Commonly known as:   DESYREL  Take 25 mg by mouth at bedtime as needed for sleep.     vitamin C 1000 MG tablet  Take 1,000 mg by mouth daily.     Vitamin D3 5000 UNITS Tabs  Take 1 tablet by mouth daily with breakfast.       Follow-up Information    Follow up with Arnette Norris, MD. Schedule an appointment as soon as possible for a visit in 1 week.   Specialty:  Family Medicine   Contact information:   [place] [address] [phone]        The results of significant diagnostics from this hospitalization (including imaging, microbiology, ancillary and laboratory) are listed below for reference.    Significant Diagnostic Studies: Ct Head Wo Contrast  06/01/2015   CLINICAL DATA:  Altered level of consciousness.  EXAM: CT HEAD WITHOUT CONTRAST  TECHNIQUE: Contiguous axial images were obtained from the base of the skull through the vertex without intravenous contrast.  COMPARISON:  Mar 16, 2015  FINDINGS: There is persistent, extensive ex vacuo phenomenon in both frontal lobes with mild residua of prior hemorrhage in these areas, stable. The encephalomalacia extends along the medial frontal lobes to the vertex and extends posteriorly into the medial proximal parietal lobes along the most superior aspects. Prominence of the lateral and third ventricles is a stable finding. There is a stable aneurysm clip just inferior and anterior to the third ventricle, slightly toward the left, stable. There is no mass or extra-axial fluid. No acute hemorrhage evident. No new gray-white compartment lesion. No acute infarct. Patchy small vessel disease in the centra semiovale is stable.  There is postoperative change with evidence of left frontal craniotomy, stable. No new bone lesions. Mastoid air cells are clear.  IMPRESSION: Stable encephalomalacia and bilateral frontal infarcts, noted previously. Infarct extends into the anterior left temporal lobe, stable. More superiorly, infarction extends into the  medial  anterior parietal lobes. There is no new gray-white compartment lesion. No acute infarct evident. No acute hemorrhage or mass effect. Prominence of the lateral and third ventricles is stable. Evidence of residua of old hemorrhage within the areas of encephalomalacia, stable. Aneurysm clip present slightly inferior to the third ventricle.   Electronically Signed   By: Lowella Grip III M.D.   On: 06/01/2015 16:41   Ir Fluoro Guide Cv Line Right  06/03/2015   INDICATION: Hypernatremia, encephalopathy, prior CVA, poor IV access ; request is made for central venous access for fluids and medications.  EXAM: ULTRASOUND AND FLUOROSCOPIC GUIDED PICC LINE INSERTION  MEDICATIONS: None.  CONTRAST:  None  FLUOROSCOPY TIME:  66 seconds.  COMPLICATIONS: None immediate  TECHNIQUE: The procedure, risks, benefits, and alternatives were explained to the patient's son and informed written consent was obtained. A timeout was performed prior to the initiation of the procedure.  The right upper extremity was prepped with chlorhexidine in a sterile fashion, and a sterile drape was applied covering the operative field. Maximum barrier sterile technique with sterile gowns and gloves were used for the procedure. A timeout was performed prior to the initiation of the procedure. Local anesthesia was provided with 1% lidocaine.  Under direct ultrasound guidance, the right basilic vein was accessed with a micropuncture kit after the overlying soft tissues were anesthetized with 1% lidocaine. An ultrasound image was saved for documentation purposes. A guidewire was advanced to the level of the superior caval-atrial junction for measurement purposes and the PICC line was cut to length. A peel-away sheath was placed and a 42 cm, 5 [place], dual lumen was inserted to level of the superior caval-atrial junction. A post procedure spot fluoroscopic was obtained. The catheter easily aspirated and flushed and was sutured in place. A dressing was  placed. The patient tolerated the procedure well without immediate post procedural complication.  FINDINGS: After catheter placement, the tip lies within the superior cavoatrial junction. The catheter aspirates and flushes normally and is ready for immediate use.  IMPRESSION: Successful ultrasound and fluoroscopic guided placement of a right basilic vein approach, 42 cm, 5 French, dual lumen PICC with tip at the superior caval-atrial junction. The PICC line is ready for immediate use.  Read by: Rowe Robert, PA-C   Electronically Signed   By: Sandi Mariscal M.D.   On: 06/03/2015 17:22   Ir US Guide Vasc Access Right  06/03/2015   INDICATION: Hypernatremia, encephalopathy, prior CVA, poor IV access ; request is made for central venous access for fluids and medications.  EXAM: ULTRASOUND AND FLUOROSCOPIC GUIDED PICC LINE INSERTION  MEDICATIONS: None.  CONTRAST:  None  FLUOROSCOPY TIME:  66 seconds.  COMPLICATIONS: None immediate  TECHNIQUE: The procedure, risks, benefits, and alternatives were explained to the patient's son and informed written consent was obtained. A timeout was performed prior to the initiation of the procedure.  The right upper extremity was prepped with chlorhexidine in a sterile fashion, and a sterile drape was applied covering the operative field. Maximum barrier sterile technique with sterile gowns and gloves were used for the procedure. A timeout was performed prior to the initiation of the procedure. Local anesthesia was provided with 1% lidocaine.  Under direct ultrasound guidance, the right basilic vein was accessed with a micropuncture kit after the overlying soft tissues were anesthetized with 1% lidocaine. An ultrasound image was saved for documentation purposes. A guidewire was advanced to the level of the superior caval-atrial junction for measurement purposes  and the PICC line was cut to length. A peel-away sheath was placed and a 42 cm, 5 [place], dual lumen was inserted to level of  the superior caval-atrial junction. A post procedure spot fluoroscopic was obtained. The catheter easily aspirated and flushed and was sutured in place. A dressing was placed. The patient tolerated the procedure well without immediate post procedural complication.  FINDINGS: After catheter placement, the tip lies within the superior cavoatrial junction. The catheter aspirates and flushes normally and is ready for immediate use.  IMPRESSION: Successful ultrasound and fluoroscopic guided placement of a right basilic vein approach, 42 cm, 5 French, dual lumen PICC with tip at the superior caval-atrial junction. The PICC line is ready for immediate use.  Read by: Rowe Robert, PA-C   Electronically Signed   By: Sandi Mariscal M.D.   On: 06/03/2015 17:22   Dg Chest Port 1 View  06/03/2015   CLINICAL DATA:  Hypoxia  EXAM: PORTABLE CHEST - 1 VIEW  COMPARISON:  June 01, 2015  FINDINGS: There is slight left base atelectasis. There is no edema or consolidation. Heart size and pulmonary vascularity are within normal limits. No adenopathy. There are no focal bone lesions.  IMPRESSION: Mild left base atelectasis.  No edema or consolidation.   Electronically Signed   By: Lowella Grip III M.D.   On: 06/03/2015 11:39   Dg Chest Port 1 View  06/01/2015   CLINICAL DATA:  Altered mental status.  EXAM: PORTABLE CHEST - 1 VIEW  COMPARISON:  Mar 18, 2015.  FINDINGS: The heart size and mediastinal contours are within normal limits. Both lungs are clear. No pneumothorax or pleural effusion is noted. Hypoinflation of the lungs is noted. The visualized skeletal structures are unremarkable.  IMPRESSION: No acute cardiopulmonary abnormality seen.   Electronically Signed   By: Marijo Conception, M.D.   On: 06/01/2015 15:47   Dg Swallowing Func-speech Pathology  06/03/2015    Objective Swallowing Evaluation:    Patient Details  Name: [name] MRN: [id number] Date of Birth: [birth year]  Today's Date: 06/03/2015 Time: SLP Start Time  (ACUTE ONLY): 0859-SLP Stop Time (ACUTE ONLY): 0935 SLP Time Calculation (min) (ACUTE ONLY): 36 min  Past Medical History:  Past Medical History  Diagnosis Date  . Hypertension   . SAH (subarachnoid hemorrhage)   . Seizure   . Stroke   . Tracheostomy status   . Tracheomalacia   . DVT (deep venous thrombosis)    Past Surgical History:  Past Surgical History  Procedure Laterality Date  . Abdominal hysterectomy    . Cholecystectomy     HPI:  Other Pertinent Information: [name] is a 49 y.o. female with  complicated medical history. She was hospitalized at [facility] because of  front headache which was attributed to an aneurysm. During her clipping  procedure, she had rupture of the aneurysm resulting in devastating  subarachnoid hemorrhage, ischemic strokes, and seizures. She required a  more than two month hospitalization with tracheostomy and PEG tube  placement and was eventually discharged to SNF with guarded prognosis,  inability to communicate or follow commands and quadriplegia. She has  been at SNF ever since except for a hospitalization for tracheitis in  04/2014 and for hypernatremia and C. Diff diarrhea in 03/2015. She was  brought to the hospital for increased drowsiness and decreased PO intake.  In the ED a CT scan of the head was performed and was negative for acute  findings. She was also  found to have hypernatremia to 171 with  hyperchloremia of > 130.Most recent MBS in  09/2014 recommended Dys 3  diet and nectar thick liquids. She has been on Dys 1 diet and nectar thick  liquids at SNF per MD note, however has had difficulty maintaining  hydration and son is now requesting consideration of thin liquids.  No Data Recorded  Assessment / Plan / Recommendation CHL IP CLINICAL IMPRESSIONS 06/03/2015  Therapy Diagnosis Severe oral phase dysphagia;Moderate oral phase  dysphagia;Mild cervical esophageal phase dysphagia;Mild pharyngeal phase  dysphagia  Clinical Impression  Pt presents with  moderately severe oral and mild pharyngeal dysphagia  without aspiration of any consistency tested.  Oral transiting is very  delayed and discoordinated resulting in premature spillage into pharynx  and oral residuals.  Pharyngeal swallow was overall strong albeit mildly  delayed.  Minimal residuals noted with liquids more than solids.  Trace  laryngeal penetration of nectar noted that cleared with further swallows  due to adequacy of laryngeal closure.  Backflow of liquids from proximal  esophagus into pharynx noted without pt adequate awareness.  Suspect  component of esophageal dysphagia that may be consistent with dysmotility  given appearance of delayed clearance and backflow.    Unfortunately, due to pt's motor planning deficits she is unable to  cough/throat clear or dry swallow on command.   Due to these factors and  dysphagia, pt remains an aspiration/malnutrition risk. However, she did  AUDIBLY aspirate on MBS in 2015 and therefore feasible consideration is to  advance liquids to thin and monitor tolerance closely.  Highly recommend  follow up at SNF for tolerance of po diet and adjustments as needed.         CHL IP TREATMENT RECOMMENDATION 06/03/2015  Treatment Recommendations Therapy as outlined in treatment plan below     CHL IP DIET RECOMMENDATION 06/03/2015  SLP Diet Recommendations Thin;Dysphagia 1 (Puree)  Liquid Administration via Cup, straw  Medication Administration Crushed with puree  Compensations Slow rate;Small sips/bites;Check for pocketing  Postural Changes and/or Swallow Maneuvers Assure stays upright     CHL IP OTHER RECOMMENDATIONS 06/03/2015  Recommended Consults (None)  Oral Care Recommendations Oral care BID  Other Recommendations (None)     CHL IP FOLLOW UP RECOMMENDATIONS 03/22/2015  Follow up Recommendations Skilled Nursing facility     [facility] IP FREQUENCY AND DURATION 06/03/2015  Speech Therapy Frequency (ACUTE ONLY) min 2x/week  Treatment Duration 1 week         CHL IP REASON FOR  REFERRAL 06/03/2015  Reason for Referral Objectively evaluate swallowing function     CHL IP ORAL PHASE 06/03/2015  Oral Phase Impaired      CHL IP PHARYNGEAL PHASE 06/03/2015  Pharyngeal Phase Impaired      CHL IP CERVICAL ESOPHAGEAL PHASE 06/03/2015  Cervical Esophageal Phase Impaired  Nectar Teaspoon Reduced cricopharyngeal relaxation  Nectar Cup Reduced cricopharyngeal relaxation  Nectar Straw Reduced cricopharyngeal relaxation  Nectar Sippy Cup (None)  Thin Teaspoon Reduced cricopharyngeal relaxation  Thin Cup Reduced cricopharyngeal relaxation;Esophageal backflow into the  pharynx  Thin Straw Reduced cricopharyngeal relaxation;Esophageal backflow into the  pharynx  Thin Sippy Cup (None)  Cervical Esophageal Comment appearance of decreased clearance of esophagus  - backflow of liquid from proximal esophagus noted into pharynx without pt  overt sensation= awareness    CHL IP GO 09/22/2014  Functional Assessment Tool Used skilled clinical judgement  Functional Limitations Swallowing  Swallow Current Status (X9371) CK  Swallow Goal Status (I9678) CK  Swallow  Discharge Status [phone]) [place], [place] [facility] SLP [phone]     Microbiology: Recent Results (from the past 240 hour(s))  MRSA PCR Screening     Status: Abnormal   Collection Time: 06/01/15  8:23 PM  Result Value Ref Range Status   MRSA by PCR POSITIVE (A) NEGATIVE Final    Comment:        The GeneXpert MRSA Assay (FDA approved for NASAL specimens only), is one component of a comprehensive MRSA colonization surveillance program. It is not intended to diagnose MRSA infection nor to guide or monitor treatment for MRSA infections. RESULT CALLED TO, READ BACK BY AND VERIFIED WITH: Doristine Devoid RN @ [phone] ON 06/01/15 BY C DAVIS      Labs: Basic Metabolic Panel:  Recent Labs Lab 06/03/15 0700 06/03/15 1010 06/03/15 2130 06/04/15 0503 06/05/15 0415 06/06/15 0520  NA 159* 157* 155* 156* 143 136  K 3.0* 3.2* 3.8 3.7 3.4* 3.6  CL  128* 128* 125* 125* 115* 108  CO2 26 25 24 24 22 22   GLUCOSE 112* 125* 111* 108* 143* 120*  BUN 11 11 10 9 6  5*  CREATININE 0.74 0.68 0.61 0.63 0.53 0.49  CALCIUM 8.9 9.1 8.8* 8.7* 8.4* 8.4*  MG 2.1  --   --   --   --   --   PHOS 3.7  --   --   --   --   --    Liver Function Tests:  Recent Labs Lab 06/01/15 1703  AST 30  ALT 41  ALKPHOS 133*  BILITOT 0.7  PROT 7.2  ALBUMIN 3.6   No results for input(s): LIPASE, AMYLASE in the last 168 hours. No results for input(s): AMMONIA in the last 168 hours. CBC:  Recent Labs Lab 06/01/15 1703 06/02/15 0355 06/03/15 1010 06/06/15 0520  WBC 5.3 4.8 4.4 6.3  NEUTROABS 2.2  --   --   --   HGB 12.7 12.8 13.4 12.8  HCT 42.2 42.2 41.7 38.1  MCV 96.3 98.4 93.1 88.2  PLT 101* 101* 94* 94*   Cardiac Enzymes: No results for input(s): CKTOTAL, CKMB, CKMBINDEX, TROPONINI in the last 168 hours. BNP: BNP (last 3 results)  Recent Labs  03/18/15 1240  BNP 69.7    ProBNP (last 3 results) No results for input(s): PROBNP in the last 8760 hours.  CBG: No results for input(s): GLUCAP in the last 168 hours.  Time coordinating discharge: 35 minutes  Signed:  Kyilee Gregg  [facility] 06/06/2015, 1:16 PM

## 2015-06-06 NOTE — Progress Notes (Signed)
Speech Language Pathology Treatment: Dysphagia  Patient Details Name: [name] MRN: [id number] DOB: 10/03/1966 Today's Date: 06/06/2015 Time: 1191-4782 SLP Time Calculation (min) (ACUTE ONLY): 14 min  Assessment / Plan / Recommendation Clinical Impression  Pt with good tolerance of medications with applesauce crushed per RN.  SLP assisted pt to consume a cola float via straw, cup and spoon.  Delayed oral transiting apparent with intermittent multiple swallows- likely piecemealing.  No symptoms of airway compromise or infiltration.      Thankfully pt has tolerated advancement in liquids with all vitals stable and hopefully advancement will improve her QOL.    Liquids will be easier for pt to manage due to her level of oral dysphagia and recommend maximize liquid nutritional intake.  SLP to sign off at this time but recommend brief follow up at [facility] for dysphagia management.  Please reorder if desire.    HPI Other Pertinent Information: [name] is a 49 y.o. female with complicated medical history. She was hospitalized at [facility] because of front headache which was attributed to an aneurysm. 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 She required a more than two month hospitalization with tracheostomy and PEG tube placement and was eventually discharged to SNF with guarded prognosis, inability to communicate or follow commands and quadriplegia. She has been at SNF ever since except for a hospitalization for tracheitis in 04/2014 and for hypernatremia and C. Diff diarrhea in 03/2015. She was brought to the hospital for increased drowsiness and decreased PO intake. In the ED a CT scan of the head was performed and was negative for acute findings. She was also found to have hypernatremia to 171 with hyperchloremia of > 130.Most recent MBS in  09/2014 recommended Dys 3 diet and nectar  thick liquids. She has been on Dys 1 diet and nectar thick liquids at SNF per MD note, however has had difficulty maintaining hydration and son is now requesting consideration of thin liquids.   Pertinent Vitals Pain Assessment: Faces Faces Pain Scale: No hurt  SLP Plan  All goals met;Discharge SLP treatment due to (comment) (goals met)    Recommendations Diet recommendations: Thin liquid;Dysphagia 1 (puree) Liquids provided via: Straw;Cup Medication Administration: Crushed with puree (or with liquids crushed if not contranindicated) Supervision: Full supervision/cueing for compensatory strategies;Staff to assist with self feeding (uncertain if pt can self feed, she made no attempts today despite cues) Compensations: Slow rate;Small sips/bites;Check for pocketing Postural Changes and/or Swallow Maneuvers: Seated upright 90 degrees;Upright 30-60 min after meal              Oral Care Recommendations: Oral care before and after PO Follow up Recommendations: Skilled Nursing facility Plan: All goals met;Discharge SLP treatment due to (comment) (goals met)    [place], [place] [facility] SLP [phone]

## 2015-06-06 NOTE — Telephone Encounter (Signed)
Transitional care call attempted.  Left message for [name] to return call.  DPR signed. 

## 2015-06-07 NOTE — Telephone Encounter (Signed)
Transitional care call attempted.  Left message for Ixchel Duck to return call.  DPR signed.

## 2015-06-08 NOTE — Telephone Encounter (Signed)
Spoke to patient's son, [name], per [place].  [name] is doing fair since leaving the hospital.  She has been discharged to a rehab facility in [facility].  Length of stay is unknown at this time.  [name] will call back with more information once it is available.

## 2015-08-28 ENCOUNTER — Emergency Department (HOSPITAL_COMMUNITY): Payer: 59

## 2015-08-28 ENCOUNTER — Inpatient Hospital Stay (HOSPITAL_COMMUNITY)
Admission: EM | Admit: 2015-08-28 | Discharge: 2015-09-06 | DRG: 853 | Disposition: A | Discharge status: Skilled Nursing Facility | Payer: 59 | Attending: Internal Medicine | Admitting: Internal Medicine

## 2015-08-28 DIAGNOSIS — M4628 Osteomyelitis of vertebra, sacral and sacrococcygeal region: Secondary | ICD-10-CM | POA: Diagnosis not present

## 2015-08-28 DIAGNOSIS — M869 Osteomyelitis, unspecified: Secondary | ICD-10-CM | POA: Diagnosis present

## 2015-08-28 DIAGNOSIS — A419 Sepsis, unspecified organism: Principal | ICD-10-CM | POA: Diagnosis present

## 2015-08-28 DIAGNOSIS — Z79899 Other long term (current) drug therapy: Secondary | ICD-10-CM

## 2015-08-28 DIAGNOSIS — D500 Iron deficiency anemia secondary to blood loss (chronic): Secondary | ICD-10-CM | POA: Diagnosis present

## 2015-08-28 DIAGNOSIS — I8290 Acute embolism and thrombosis of unspecified vein: Secondary | ICD-10-CM

## 2015-08-28 DIAGNOSIS — I82220 Acute embolism and thrombosis of inferior vena cava: Secondary | ICD-10-CM | POA: Diagnosis present

## 2015-08-28 DIAGNOSIS — R1310 Dysphagia, unspecified: Secondary | ICD-10-CM | POA: Diagnosis present

## 2015-08-28 DIAGNOSIS — Z8673 Personal history of transient ischemic attack (TIA), and cerebral infarction without residual deficits: Secondary | ICD-10-CM | POA: Diagnosis not present

## 2015-08-28 DIAGNOSIS — Z91040 Latex allergy status: Secondary | ICD-10-CM | POA: Diagnosis not present

## 2015-08-28 DIAGNOSIS — G40909 Epilepsy, unspecified, not intractable, without status epilepticus: Secondary | ICD-10-CM | POA: Diagnosis present

## 2015-08-28 DIAGNOSIS — R569 Unspecified convulsions: Secondary | ICD-10-CM

## 2015-08-28 DIAGNOSIS — E876 Hypokalemia: Secondary | ICD-10-CM | POA: Diagnosis present

## 2015-08-28 DIAGNOSIS — Z8249 Family history of ischemic heart disease and other diseases of the circulatory system: Secondary | ICD-10-CM | POA: Diagnosis not present

## 2015-08-28 DIAGNOSIS — R4182 Altered mental status, unspecified: Secondary | ICD-10-CM | POA: Diagnosis present

## 2015-08-28 DIAGNOSIS — R6520 Severe sepsis without septic shock: Secondary | ICD-10-CM | POA: Diagnosis present

## 2015-08-28 DIAGNOSIS — E860 Dehydration: Secondary | ICD-10-CM | POA: Diagnosis present

## 2015-08-28 DIAGNOSIS — Z86718 Personal history of other venous thrombosis and embolism: Secondary | ICD-10-CM | POA: Diagnosis not present

## 2015-08-28 DIAGNOSIS — R627 Adult failure to thrive: Secondary | ICD-10-CM | POA: Diagnosis not present

## 2015-08-28 DIAGNOSIS — E870 Hyperosmolality and hypernatremia: Secondary | ICD-10-CM | POA: Diagnosis not present

## 2015-08-28 DIAGNOSIS — Z880 Allergy status to penicillin: Secondary | ICD-10-CM

## 2015-08-28 DIAGNOSIS — Z888 Allergy status to other drugs, medicaments and biological substances status: Secondary | ICD-10-CM

## 2015-08-28 DIAGNOSIS — E669 Obesity, unspecified: Secondary | ICD-10-CM | POA: Diagnosis present

## 2015-08-28 DIAGNOSIS — I82402 Acute embolism and thrombosis of unspecified deep veins of left lower extremity: Secondary | ICD-10-CM | POA: Diagnosis present

## 2015-08-28 DIAGNOSIS — Z515 Encounter for palliative care: Secondary | ICD-10-CM | POA: Diagnosis present

## 2015-08-28 DIAGNOSIS — L8994 Pressure ulcer of unspecified site, stage 4: Secondary | ICD-10-CM

## 2015-08-28 DIAGNOSIS — Z87891 Personal history of nicotine dependence: Secondary | ICD-10-CM

## 2015-08-28 DIAGNOSIS — B9620 Unspecified Escherichia coli [E. coli] as the cause of diseases classified elsewhere: Secondary | ICD-10-CM | POA: Diagnosis present

## 2015-08-28 DIAGNOSIS — Z930 Tracheostomy status: Secondary | ICD-10-CM

## 2015-08-28 DIAGNOSIS — L089 Local infection of the skin and subcutaneous tissue, unspecified: Secondary | ICD-10-CM

## 2015-08-28 DIAGNOSIS — I10 Essential (primary) hypertension: Secondary | ICD-10-CM | POA: Diagnosis present

## 2015-08-28 DIAGNOSIS — N390 Urinary tract infection, site not specified: Secondary | ICD-10-CM | POA: Diagnosis not present

## 2015-08-28 DIAGNOSIS — G9340 Encephalopathy, unspecified: Secondary | ICD-10-CM

## 2015-08-28 DIAGNOSIS — Z66 Do not resuscitate: Secondary | ICD-10-CM | POA: Diagnosis not present

## 2015-08-28 DIAGNOSIS — L89154 Pressure ulcer of sacral region, stage 4: Secondary | ICD-10-CM | POA: Diagnosis present

## 2015-08-28 DIAGNOSIS — I959 Hypotension, unspecified: Secondary | ICD-10-CM | POA: Diagnosis present

## 2015-08-28 DIAGNOSIS — Z7189 Other specified counseling: Secondary | ICD-10-CM | POA: Diagnosis not present

## 2015-08-28 DIAGNOSIS — R509 Fever, unspecified: Secondary | ICD-10-CM

## 2015-08-28 DIAGNOSIS — K921 Melena: Secondary | ICD-10-CM | POA: Diagnosis not present

## 2015-08-28 DIAGNOSIS — D638 Anemia in other chronic diseases classified elsewhere: Secondary | ICD-10-CM | POA: Diagnosis present

## 2015-08-28 LAB — URINE MICROSCOPIC-ADD ON

## 2015-08-28 LAB — COMPREHENSIVE METABOLIC PANEL
ALK PHOS: 71 U/L (ref 38–126)
ALT: 11 U/L — AB (ref 14–54)
AST: 16 U/L (ref 15–41)
Albumin: 2.5 g/dL — ABNORMAL LOW (ref 3.5–5.0)
BILIRUBIN TOTAL: 0.6 mg/dL (ref 0.3–1.2)
BUN: 47 mg/dL — AB (ref 6–20)
CALCIUM: 9.4 mg/dL (ref 8.9–10.3)
CO2: 19 mmol/L — ABNORMAL LOW (ref 22–32)
CREATININE: 1.04 mg/dL — AB (ref 0.44–1.00)
Chloride: >130 mmol/L (ref 101–111)
GFR calc Af Amer: >60 mL/min (ref >60)
Glucose, Bld: 104 mg/dL — ABNORMAL HIGH (ref 65–99)
Potassium: 4.3 mmol/L (ref 3.5–5.1)
Sodium: 158 mmol/L — ABNORMAL HIGH (ref 135–145)
Total Protein: 7.4 g/dL (ref 6.5–8.1)

## 2015-08-28 LAB — URINALYSIS, ROUTINE W REFLEX MICROSCOPIC
Bilirubin Urine: NEGATIVE
GLUCOSE, UA: NEGATIVE mg/dL
HGB URINE DIPSTICK: NEGATIVE
KETONES UR: NEGATIVE mg/dL
Nitrite: POSITIVE — AB
PROTEIN: NEGATIVE mg/dL
Specific Gravity, Urine: 1.023 (ref 1.005–1.030)
UROBILINOGEN UA: 0.2 mg/dL (ref 0.0–1.0)
pH: 5 (ref 5.0–8.0)

## 2015-08-28 LAB — CBC WITH DIFFERENTIAL/PLATELET
BASOS PCT: 0 %
Basophils Absolute: 0 10*3/uL (ref 0.0–0.1)
EOS ABS: 0.1 10*3/uL (ref 0.0–0.7)
EOS PCT: 0 %
HCT: 31.7 % — ABNORMAL LOW (ref 36.0–46.0)
HEMOGLOBIN: 10 g/dL — AB (ref 12.0–15.0)
Lymphocytes Relative: 31 %
Lymphs Abs: 4.8 10*3/uL — ABNORMAL HIGH (ref 0.7–4.0)
MCH: 27.8 pg (ref 26.0–34.0)
MCHC: 31.5 g/dL (ref 30.0–36.0)
MCV: 88.1 fL (ref 78.0–100.0)
Monocytes Absolute: 1 10*3/uL (ref 0.1–1.0)
Monocytes Relative: 7 %
NEUTROS PCT: 62 %
Neutro Abs: 9.6 10*3/uL — ABNORMAL HIGH (ref 1.7–7.7)
PLATELETS: 226 10*3/uL (ref 150–400)
RBC: 3.6 MIL/uL — AB (ref 3.87–5.11)
RDW: 15.7 % — ABNORMAL HIGH (ref 11.5–15.5)
WBC: 15.5 10*3/uL — AB (ref 4.0–10.5)

## 2015-08-28 LAB — TROPONIN I: TROPONIN I: 0.05 ng/mL — AB (ref <0.031)

## 2015-08-28 LAB — MRSA PCR SCREENING: MRSA by PCR: NEGATIVE

## 2015-08-28 LAB — I-STAT CG4 LACTIC ACID, ED
Lactic Acid, Venous: 1.78 mmol/L (ref 0.5–2.0)
Lactic Acid, Venous: 1.28 mmol/L (ref 0.5–2.0)

## 2015-08-28 LAB — PROCALCITONIN: Procalcitonin: 0.22 ng/mL

## 2015-08-28 MED ORDER — ONDANSETRON HCL 4 MG PO TABS: 4.0000 mg | ORAL_TABLET | Freq: Four times a day (QID) | ORAL | Status: DC | PRN

## 2015-08-28 MED ORDER — SODIUM CHLORIDE 0.9 % IV SOLN: 500.0000 mL | INTRAVENOUS | Status: DC | PRN

## 2015-08-28 MED ORDER — VANCOMYCIN HCL IN DEXTROSE 1-5 GM/200ML-% IV SOLN
1000.0000 mg | Freq: Once | INTRAVENOUS | Status: AC
  Administered 2015-08-28: 1000 mg via INTRAVENOUS
  Filled 2015-08-28: qty 200

## 2015-08-28 MED ORDER — SODIUM CHLORIDE 0.9 % IV SOLN
1500.0000 mg | Freq: Two times a day (BID) | INTRAVENOUS | Status: DC
  Administered 2015-08-28 – 2015-08-31 (×6): 1500 mg via INTRAVENOUS
  Filled 2015-08-28 (×6): qty 15

## 2015-08-28 MED ORDER — MORPHINE SULFATE (PF) 2 MG/ML IV SOLN
2.0000 mg | INTRAVENOUS | Status: DC | PRN
  Administered 2015-08-29 – 2015-09-01 (×7): 2 mg via INTRAVENOUS
  Filled 2015-08-28 (×7): qty 1

## 2015-08-28 MED ORDER — ACETAMINOPHEN 325 MG PO TABS: 650.0000 mg | ORAL_TABLET | Freq: Four times a day (QID) | ORAL | Status: DC | PRN

## 2015-08-28 MED ORDER — ACETAMINOPHEN 650 MG RE SUPP: 650.0000 mg | Freq: Four times a day (QID) | RECTAL | Status: DC | PRN

## 2015-08-28 MED ORDER — DEXTROSE 5 % IV SOLN
2.0000 g | Freq: Once | INTRAVENOUS | Status: AC
  Administered 2015-08-28: 2 g via INTRAVENOUS
  Filled 2015-08-28: qty 2

## 2015-08-28 MED ORDER — SODIUM CHLORIDE 0.9 % IJ SOLN
3.0000 mL | Freq: Two times a day (BID) | INTRAMUSCULAR | Status: DC
  Administered 2015-08-29 – 2015-09-04 (×11): 3 mL via INTRAVENOUS

## 2015-08-28 MED ORDER — SODIUM CHLORIDE 0.9 % IV SOLN
INTRAVENOUS | Status: DC
  Administered 2015-08-28 (×2): via INTRAVENOUS

## 2015-08-28 MED ORDER — ONDANSETRON HCL 4 MG/2ML IJ SOLN: 4.0000 mg | Freq: Four times a day (QID) | INTRAMUSCULAR | Status: DC | PRN

## 2015-08-28 MED ORDER — VANCOMYCIN HCL IN DEXTROSE 1-5 GM/200ML-% IV SOLN
1000.0000 mg | Freq: Two times a day (BID) | INTRAVENOUS | Status: DC
  Administered 2015-08-28 – 2015-08-31 (×6): 1000 mg via INTRAVENOUS
  Filled 2015-08-28 (×6): qty 200

## 2015-08-28 MED ORDER — DEXTROSE 5 % IV SOLN
1.0000 g | Freq: Three times a day (TID) | INTRAVENOUS | Status: DC
  Administered 2015-08-28 – 2015-09-05 (×24): 1 g via INTRAVENOUS
  Filled 2015-08-28 (×26): qty 1

## 2015-08-28 MED ORDER — SODIUM CHLORIDE 0.9 % IV SOLN
100.0000 mg | Freq: Two times a day (BID) | INTRAVENOUS | Status: DC
  Administered 2015-08-28 – 2015-09-06 (×18): 100 mg via INTRAVENOUS
  Filled 2015-08-28 (×19): qty 10

## 2015-08-28 MED ORDER — SODIUM CHLORIDE 0.9 % IV BOLUS (SEPSIS)
1000.0000 mL | INTRAVENOUS | Status: AC
  Administered 2015-08-28 (×3): 1000 mL via INTRAVENOUS

## 2015-08-28 NOTE — ED Notes (Signed)
Pt from [facility] via EMS- Per EMS facility sts that pt has elevated WBC, hypotension, tachycardia, altered mental status per facility. Pt has hx of CVA with L sided weakness. Pt is non-verbal and is in NAD

## 2015-08-28 NOTE — ED Notes (Signed)
Report called transfer to ICU-surgery at bedside

## 2015-08-28 NOTE — ED Notes (Signed)
NT/RN at bedside attempting to place foley

## 2015-08-28 NOTE — ED Notes (Signed)
Informed Revonda StandardAllison, RN of transfer delay

## 2015-08-28 NOTE — ED Notes (Signed)
Bed: RESB Expected date:  Expected time:  Means of arrival:  Comments: Decereased LOC,

## 2015-08-28 NOTE — ED Provider Notes (Signed)
CSN: [id number]     Arrival date & time 08/28/15  1344 History   First MD Initiated Contact with Patient 08/28/15 1359     Chief Complaint  Patient presents with  . Hypotension  . Altered Mental Status     (Consider location/radiation/quality/duration/timing/severity/associated sxs/prior Treatment) HPI Comments: 49 yo female who is nonverbal secondary to ruptured aneurysm who presents from the nursing facility secondary to altered mental status.  Found to be septic on arrival.  She is unable to provide any history due to her nonverbal status.  Level V Caveat applies.   Patient is a 49 y.o. female presenting with altered mental status.  Altered Mental Status Presenting symptoms: lethargy   Severity:  Moderate Most recent episode:  2 days ago Episode history:  Continuous Timing:  Constant Progression:  Worsening Chronicity:  New Context: nursing home resident   Associated symptoms: fever (today)     Past Medical History  Diagnosis Date  . Hypertension   . SAH (subarachnoid hemorrhage)   . Seizure   . Stroke   . Tracheostomy status   . Tracheomalacia   . DVT (deep venous thrombosis)    Past Surgical History  Procedure Laterality Date  . Abdominal hysterectomy    . Cholecystectomy     Family History  Problem Relation Age of Onset  . Heart disease Mother 5    MI   Social History  Substance Use Topics  . Smoking status: Former Smoker    Types: Cigarettes  . Smokeless tobacco: Never Used     Comment: trying to quit-2 cigarettes daily  . Alcohol Use: No     Comment: Very seldom   OB History    No data available     Review of Systems  Unable to perform ROS: Patient nonverbal  Constitutional: Positive for fever (today).      Allergies  Amlodipine; Penicillins; Latex; and Other  Home Medications   Prior to Admission medications   Medication Sig Start Date End Date Taking? Authorizing Provider  acetaminophen (TYLENOL) 325 MG tablet Take 2 tablets (650 mg  total) by mouth daily. Patient taking differently: Take 650 mg by mouth daily as needed for moderate pain. 2 tablets given every day, may take additional tablets PRN for pain 03/22/15   Hollice Espy, MD  Ascorbic Acid (VITAMIN C) 1000 MG tablet Take 1,000 mg by mouth daily.    Historical Provider, MD  carboxymethylcellulose (REFRESH PLUS) 0.5 % SOLN Place 2 drops into both eyes 3 (three) times daily.    Historical Provider, MD  Cholecalciferol (VITAMIN D3) 5000 UNITS TABS Take 1 tablet by mouth daily with breakfast.    Historical Provider, MD  cloNIDine (CATAPRES - DOSED IN MG/24 HR) 0.3 mg/24hr patch Place 1 patch (0.3 mg total) onto the skin once a week. 06/06/15   Renae Fickle, MD  famotidine (PEPCID) 40 MG/5ML suspension Take 2.5 mLs (20 mg total) by mouth every 12 (twelve) hours. 03/22/15   Hollice Espy, MD  feeding supplement, ENSURE ENLIVE, (ENSURE ENLIVE) LIQD Take 237 mLs by mouth 2 (two) times daily between meals. 06/06/15   Renae Fickle, MD  lacosamide (VIMPAT) 50 MG TABS tablet Take 2 tablets (100 mg total) by mouth 2 (two) times daily. 06/06/15   Renae Fickle, MD  levETIRAcetam (KEPPRA) 750 MG tablet Take 1,500 mg by mouth 2 (two) times daily.    Historical Provider, MD  metoprolol (LOPRESSOR) 100 MG tablet Take 1 tablet (100 mg total) by mouth 2 (two)  times daily. 03/22/15   Hollice Espy, MD  minoxidil (LONITEN) 10 MG tablet Take 1 tablet (10 mg total) by mouth 2 (two) times daily. 06/06/15   Renae Fickle, MD  Multiple Vitamin (MULTIVITAMIN WITH MINERALS) TABS tablet Take 1 tablet by mouth daily with breakfast.    Historical Provider, MD  phenytoin (DILANTIN) 100 MG ER capsule Take 200 mg by mouth 2 (two) times daily.    Historical Provider, MD  traZODone (DESYREL) 50 MG tablet Take 25 mg by mouth at bedtime as needed for sleep.     Historical Provider, MD   BP 110/57 mmHg  Pulse 104  Temp(Src) 101.5 F (38.6 C) (Rectal)  Resp 40  SpO2 96% Physical Exam    Constitutional: She appears well-developed and well-nourished. No distress.  HENT:  Head: Normocephalic and atraumatic.  Mouth/Throat: Oropharynx is clear and moist.  Eyes: Conjunctivae are normal. Pupils are equal, round, and reactive to light. No scleral icterus.  Neck: Neck supple.  Cardiovascular: Regular rhythm, normal heart sounds and intact distal pulses.  Tachycardia present.   No murmur heard. Pulmonary/Chest: Effort normal and breath sounds normal. No stridor. Tachypnea noted. She has no rales.  Shallow fast breathing pattern  Abdominal: Soft. Bowel sounds are normal. She exhibits no distension. There is no tenderness. There is no rigidity, no rebound and no guarding.  Musculoskeletal: Normal range of motion.  Neurological: She is alert.  Alert, nonverbal.  Tracks caregivers.  Skin: Skin is warm and dry. No rash noted.     Nursing note and vitals reviewed.   ED Course  .Critical Care Performed by: Blake Divine Authorized by: Blake Divine Total critical care time: 45 minutes Critical care time was exclusive of separately billable procedures and treating other patients. Critical care was time spent personally by me on the following activities: development of treatment plan with patient or surrogate, discussions with consultants, evaluation of patient's response to treatment, examination of patient, obtaining history from patient or surrogate, ordering and performing treatments and interventions, ordering and review of laboratory studies, ordering and review of radiographic studies, pulse oximetry, re-evaluation of patient's condition and review of old charts.   (including critical care time) Labs Review Labs Reviewed  CBC WITH DIFFERENTIAL/PLATELET - Abnormal; Notable for the following:    WBC 15.5 (*)    RBC 3.60 (*)    Hemoglobin 10.0 (*)    HCT 31.7 (*)    RDW 15.7 (*)    Neutro Abs 9.6 (*)    Lymphs Abs 4.8 (*)    All other components within normal limits   CULTURE, BLOOD (ROUTINE X 2)  CULTURE, BLOOD (ROUTINE X 2)  URINE CULTURE  COMPREHENSIVE METABOLIC PANEL  URINALYSIS, ROUTINE W REFLEX MICROSCOPIC (NOT AT [facility])  I-STAT CG4 LACTIC ACID, ED    Imaging Review Dg Chest Port 1 View  08/28/2015  CLINICAL DATA:  Hypotension with tachycardia and altered mental status with elevated white blood cell count. EXAM: PORTABLE CHEST 1 VIEW COMPARISON:  06/03/2015 FINDINGS: Lungs are adequately inflated without consolidation or effusion. Cardiomediastinal silhouette and remainder of the exam is unchanged. IMPRESSION: No active disease. Electronically Signed   By: Elberta Fortis M.D.   On: 08/28/2015 14:40   I have personally reviewed and evaluated these images and lab results as part of my medical decision-making.   EKG Interpretation None     EKG - sinus tachy, rate 103, normal axis, normal intervals, no ST/T abnormalities.  MDM   Final diagnoses:  Severe sepsis (  HCC)  Decubitus ulcer, infected, stage IV (HCC)  Urinary tract infection without hematuria, site unspecified    49 yo female presenting with sepsis.  Likely source is sacral wound.  Have discussed with Dr. Daphine DeutscherMartin (Gen Surgery) who will evaluate.  Also has evidence of UTI.  Treated empirically for sepsis at time of arrival with IV fluids, IV Vanco, IV Ceftaz (chosen due to allergies and drug backorders).  Discussed case with Dr. Rito EhrlichKrishnan who will admit to the ICU.      Blake DivineJohn Kaia Depaolis, MD 08/28/15 [phone]

## 2015-08-28 NOTE — Progress Notes (Signed)
ANTIBIOTIC CONSULT NOTE - INITIAL  Pharmacy Consult for Vancomycin & Elita QuickFortaz Indication: rule out sepsis  Allergies  Allergen Reactions  . Amlodipine Swelling  . Penicillins Swelling  . Latex Rash    Unknown   . Other Other (See Comments)    Natural Rubber- Unknown    Patient Measurements:    Total body weight: 84 kg July 2016  Vital Signs: Temp: 101.5 F (38.6 C) (10/23 1352) Temp Source: Rectal (10/23 1352) BP: 110/57 mmHg (10/23 1352) Pulse Rate: 104 (10/23 1352) Intake/Output from previous day:   Intake/Output from this shift:    Labs: No results for input(s): WBC, HGB, PLT, LABCREA, CREATININE in the last 72 hours. CrCl cannot be calculated (Unknown ideal weight.). No results for input(s): VANCOTROUGH, VANCOPEAK, VANCORANDOM, GENTTROUGH, GENTPEAK, GENTRANDOM, TOBRATROUGH, TOBRAPEAK, TOBRARND, AMIKACINPEAK, AMIKACINTROU, AMIKACIN in the last 72 hours.   Microbiology: No results found for this or any previous visit (from the past 720 hour(s)).  Medical History: Past Medical History  Diagnosis Date  . Hypertension   . SAH (subarachnoid hemorrhage)   . Seizure   . Stroke   . Tracheostomy status   . Tracheomalacia   . DVT (deep venous thrombosis)    Medications:  Scheduled:   Anti-infectives    Start     Dose/Rate Route Frequency Ordered Stop   08/28/15 1430  cefTAZidime (FORTAZ) 2 g in dextrose 5 % 50 mL IVPB     2 g 100 mL/hr over 30 Minutes Intravenous  Once 08/28/15 1415     08/28/15 1415  vancomycin (VANCOCIN) IVPB 1000 mg/200 mL premix     1,000 mg 200 mL/hr over 60 Minutes Intravenous  Once 08/28/15 1408       Assessment: 49 yoF to ED from SNF for AMS, leukocytosis, tachycardia. Hx of CVA with L hemiparesis. PCN allergy noted with rx of    "swelling", has tolerated Cefepime previously. Begin Vancomycin and Ceftazidime, pharmacy to dose.   Goal of Therapy:  Vancomycin trough level 15-20 mcg/ml  Plan:   Vancomycin 1gm and Fortaz 2gm in ED,  followed by Vancomycin 1gm q12 & Fortaz 1gm q8hr  Follow cultures, renal function, clinical course, de-escalate abx as appropriate  Otho BellowsGreen, Candise Crabtree L PharmD Pager [phone] 08/28/2015, 2:37 PM

## 2015-08-28 NOTE — Progress Notes (Signed)
After receiving order for foot stick only enough blood drawn for PT level. Several attempts made by multiple people. On call NP made aware.

## 2015-08-28 NOTE — H&P (Signed)
History and Physical  [name] ZOX:[id number] DOB: December 09, 1965 DOA: 08/28/2015  Referring physician: Blake Divine, ER physician PCP: Ruthe Mannan, MD   Chief Complaint: Sepsis  HPI: [name] is a 49 y.o. female  With past medical history of ruptured cerebral aneurysm which led to patient with seizure disorder, nonverbal, nonambulatory who resides in a nursing facility. Over the past week, she was noted to be less and less responsive and she was brought into the emergency room today. At that time her sodium was noted to be 158 (136 2-1/2 months ago), white count of 15 and during evaluation of patient and her chronic stage IV sacral decubitus ulcer, large amount of yellowish fluid felt to be pus drained out of this. Patient started on aggressive IV fluids and antibiotics. Lactic acid level normal. Patient also found to have a urinary tract infection. Hospitalists were called for further evaluation and admission   Review of Systems:  Patient seen after arrival to ICU. Because of patient's nonverbal status, I'm unable to get any kind of review of systems  Past Medical History  Diagnosis Date  . Hypertension   . SAH (subarachnoid hemorrhage)   . Seizure   . Stroke   . Tracheostomy status   . Tracheomalacia   . DVT (deep venous thrombosis)    Past Surgical History  Procedure Laterality Date  . Abdominal hysterectomy    . Cholecystectomy     Social History:  reports that she has quit smoking. Her smoking use included Cigarettes. She has never used smokeless tobacco. She reports that she does not drink alcohol or use illicit drugs. Patient lives at a skilled nursing facility & is nonambulatory. She is in a wheelchair  Allergies  Allergen Reactions  . Amlodipine Swelling  . Penicillins Swelling  . Latex Rash    Unknown   . Other Other (See Comments)    Natural Rubber- Unknown     Family History  Problem Relation Age of Onset  . Heart disease Mother 18    MI      Prior to Admission medications   Medication Sig Start Date End Date Taking? Authorizing Provider  acetaminophen (TYLENOL) 325 MG tablet Take 2 tablets (650 mg total) by mouth daily. Patient taking differently: Take 650 mg by mouth daily as needed for moderate pain or fever. 2 tablets given every day, may take additional tablets PRN for fever above 100 F 03/22/15  Yes Hollice Espy, MD  acetaminophen (TYLENOL) 650 MG suppository Place 650 mg rectally every 4 (four) hours as needed for fever. For temp. 100 F or above   Yes Historical Provider, MD  Amino Acids-Protein Hydrolys (FEEDING SUPPLEMENT, PRO-STAT SUGAR FREE 64,) LIQD Take 30 mLs by mouth daily.   Yes Historical Provider, MD  Ascorbic Acid (VITAMIN C) 1000 MG tablet Take 1,000 mg by mouth daily.   Yes Historical Provider, MD  carboxymethylcellulose (REFRESH PLUS) 0.5 % SOLN Place 2 drops into both eyes 3 (three) times daily.   Yes Historical Provider, MD  Cholecalciferol (VITAMIN D3) 5000 UNITS TABS Take 1 tablet by mouth daily with breakfast.   Yes Historical Provider, MD  cholestyramine light (PREVALITE) 4 G packet Take 4 g by mouth 3 (three) times daily. mix in 8 oz. Of Liquid   Yes Historical Provider, MD  cloNIDine (CATAPRES - DOSED IN MG/24 HR) 0.3 mg/24hr patch Place 1 patch (0.3 mg total) onto the skin once a week. 06/06/15  Yes Renae Fickle, MD  famotidine (PEPCID)  40 MG/5ML suspension Take 2.5 mLs (20 mg total) by mouth every 12 (twelve) hours. 03/22/15  Yes Hollice EspySendil K Bill Yohn, MD  feeding supplement, ENSURE ENLIVE, (ENSURE ENLIVE) LIQD Take 237 mLs by mouth 2 (two) times daily between meals. Patient taking differently: Take 237 mLs by mouth 3 (three) times daily between meals.  06/06/15  Yes Renae FickleMackenzie Short, MD  lacosamide (VIMPAT) 50 MG TABS tablet Take 2 tablets (100 mg total) by mouth 2 (two) times daily. 06/06/15  Yes Renae FickleMackenzie Short, MD  levETIRAcetam (KEPPRA) 750 MG tablet Take 1,500 mg by mouth 2 (two) times daily.   Yes  Historical Provider, MD  lisinopril (PRINIVIL,ZESTRIL) 10 MG tablet Take 10 mg by mouth daily.   Yes Historical Provider, MD  loperamide (IMODIUM A-D) 2 MG tablet Take 4 mg by mouth 4 (four) times daily as needed for diarrhea or loose stools.   Yes Historical Provider, MD  metoprolol (LOPRESSOR) 100 MG tablet Take 1 tablet (100 mg total) by mouth 2 (two) times daily. 03/22/15  Yes Hollice EspySendil K Rocio Wolak, MD  mirtazapine (REMERON) 7.5 MG tablet Take 7.5 mg by mouth at bedtime.   Yes Historical Provider, MD  Multiple Vitamin (MULTIVITAMIN WITH MINERALS) TABS tablet Take 1 tablet by mouth daily with breakfast.   Yes Historical Provider, MD  traZODone (DESYREL) 50 MG tablet Take 25 mg by mouth at bedtime as needed for sleep.    Yes Historical Provider, MD  minoxidil (LONITEN) 10 MG tablet Take 1 tablet (10 mg total) by mouth 2 (two) times daily. Patient not taking: Reported on 08/28/2015 06/06/15   Renae FickleMackenzie Short, MD    Physical Exam: BP 130/103 mmHg  Pulse 139  Temp(Src) 99.5 F (37.5 C) (Core (Comment))  Resp 23  Ht 5\' 7"  (1.702 m)  Wt 79.9 kg (176 lb 2.4 oz)  BMI 27.58 kg/m2  SpO2 100%  General:  Awake, oriented 1? Eyes: sclerae appear nonicteric, extraocular movements are intact ENT: normocephalic, atraumatic, mucous membranes appear dry Neck: no JVD Cardiovascular: regular rate and rhythm, S1-S2, borderline tachycardia Respiratory: not a great inspiratory effort, however clear to auscultation bilaterally Abdomen: soft,? Nontender, nondistended, hypoactive bowel sounds Skin: stage IV chronic decubitus ulcer with multiple levels of staging tunneled in appearance with a much smaller wound on the left inner buttock right below it. The larger wound is approximately 10 inches across in diameter. There is some mild fluid draining from it Musculoskeletal: trace pitting edema Psychiatric: patient does not appear to be in acute psychoses Neurologic: neurologic impairment secondary to previous  ruptured aneurysm           Labs on Admission:  Basic Metabolic Panel:  Recent Labs Lab 08/28/15 1415  NA 158*  K 4.3  CL >130*  CO2 19*  GLUCOSE 104*  BUN 47*  CREATININE 1.04*  CALCIUM 9.4   Liver Function Tests:  Recent Labs Lab 08/28/15 1415  AST 16  ALT 11*  ALKPHOS 71  BILITOT 0.6  PROT 7.4  ALBUMIN 2.5*   No results for input(s): LIPASE, AMYLASE in the last 168 hours. No results for input(s): AMMONIA in the last 168 hours. CBC:  Recent Labs Lab 08/28/15 1415  WBC 15.5*  NEUTROABS 9.6*  HGB 10.0*  HCT 31.7*  MCV 88.1  PLT 226   Cardiac Enzymes: No results for input(s): CKTOTAL, CKMB, CKMBINDEX, TROPONINI in the last 168 hours.  BNP (last 3 results)  Recent Labs  03/18/15 1240  BNP 69.7    ProBNP (last 3 results) No  results for input(s): PROBNP in the last 8760 hours.  CBG: No results for input(s): GLUCAP in the last 168 hours.  Radiological Exams on Admission: Dg Chest Port 1 View  08/28/2015  CLINICAL DATA:  Hypotension with tachycardia and altered mental status with elevated white blood cell count. EXAM: PORTABLE CHEST 1 VIEW COMPARISON:  06/03/2015 FINDINGS: Lungs are adequately inflated without consolidation or effusion. Cardiomediastinal silhouette and remainder of the exam is unchanged. IMPRESSION: No active disease. Electronically Signed   By: Elberta Fortis M.D.   On: 08/28/2015 14:40    EKG: Independently reviewed. Not done  Assessment/Plan Present on Admission:  . Sepsis [facility]): Secondary to sacral abscess/stage IV decubitus ulcer of sacral region: Patient meets criteria given hypotension, leukocytosis, sacral source and fever. Responding well to aggressive IV fluids and antibiotics. Awaiting general surgery's note, although suspect that they will go for non-interventional. Awaiting wound care consult. May need plastics consult  . Obesity: Patient meets criteria with BMI greater than 30 . Hypernatremia: Patient is had this  previous issue before. Her sodium at time of discharge 2-1/2 months ago was at 136. We will aggressively volume resuscitate with normal saline and if no improvement, change over to half normal saline . HTN (hypertension): Holding antihypertensives . UTI (lower urinary tract infection): Should be covered with IV antibiotics   history of ruptured cerebral aneurysm leading to impaired mentation, nonverbal, bedbound: Now that she is more alert, dysphagia diet   seizure disorder: Currently using IV conversion  Consultants: General surgery   Code Status: Full code as confirmed by son   Family Communication: With son and his wife at the bedside   Disposition Plan: Already stabilize, we'll downgrade to stepdown   Time spent: 45 minutes   Hollice Espy [facility] Pager [phone]

## 2015-08-28 NOTE — Consult Note (Signed)
Chief Complaint:  Sacral decubitus ulcer   History of Present Illness:  [name] is an 49 y.o. female who has been in a nursing home for a couple of years after an aneurysmal bleed and stroke.  She has had a sacral decubitus and was brought to the ER today with evidence of sepsis; a UTI and a purulent draining sacral decubitus.    Past Medical History  Diagnosis Date  . Hypertension   . SAH (subarachnoid hemorrhage)   . Seizure   . Stroke   . Tracheostomy status   . Tracheomalacia   . DVT (deep venous thrombosis)     Past Surgical History  Procedure Laterality Date  . Abdominal hysterectomy    . Cholecystectomy      Current Facility-Administered Medications  Medication Dose Route Frequency Provider Last Rate Last Dose  . 0.9 %  sodium chloride infusion  500 mL Intravenous Q30 min PRN Annita Brod, MD      . 0.9 %  sodium chloride infusion   Intravenous Continuous Annita Brod, MD 75 mL/hr at 08/28/15 1834    . acetaminophen (TYLENOL) tablet 650 mg  650 mg Oral Q6H PRN Annita Brod, MD       Or  . acetaminophen (TYLENOL) suppository 650 mg  650 mg Rectal Q6H PRN Annita Brod, MD      . cefTAZidime (FORTAZ) 1 g in dextrose 5 % 50 mL IVPB  1 g Intravenous 3 times per day Minda Ditto, RPH      . lacosamide (VIMPAT) 100 mg in sodium chloride 0.9 % 25 mL IVPB  100 mg Intravenous Q12H Annita Brod, MD      . levETIRAcetam (KEPPRA) 1,500 mg in sodium chloride 0.9 % 100 mL IVPB  1,500 mg Intravenous Q12H Annita Brod, MD   1,500 mg at 08/28/15 1930  . morphine 2 MG/ML injection 2 mg  2 mg Intravenous Q3H PRN Annita Brod, MD      . ondansetron (ZOFRAN) tablet 4 mg  4 mg Oral Q6H PRN Annita Brod, MD       Or  . ondansetron (ZOFRAN) injection 4 mg  4 mg Intravenous Q6H PRN Annita Brod, MD      . sodium chloride 0.9 % injection 3 mL  3 mL Intravenous Q12H Annita Brod, MD      . vancomycin (VANCOCIN) IVPB 1000 mg/200 mL premix   1,000 mg Intravenous Q12H Minda Ditto, RPH   1,000 mg at 08/28/15 2045   Amlodipine; Penicillins; Latex; and Other Family History  Problem Relation Age of Onset  . Heart disease Mother 9    MI   Social History:   reports that she has quit smoking. Her smoking use included Cigarettes. She has never used smokeless tobacco. She reports that she does not drink alcohol or use illicit drugs.   REVIEW OF SYSTEMS : Negative except for see problem list-patient is unable to speak  Physical Exam:   Blood pressure 128/86, pulse 111, temperature 99.5 F (37.5 C), temperature source Core (Comment), resp. rate 35, height 5' 7"  (1.702 m), weight 79.9 kg (176 lb 2.4 oz), SpO2 97 %. Body mass index is 27.58 kg/(m^2).  Gen:  AAF with minimal movement but who does grimace Buttocks:  There is an approximately 9 cm defect that is undermined on the sacrum.  No crepitus was felt.  The rectum has stool within it and some liquid that could have  been purulence drained out.  No communication between the two areas was obvious.    LABORATORY RESULTS: Results for orders placed or performed during the hospital encounter of 08/28/15 (from the past 48 hour(s))  Comprehensive metabolic panel     Status: Abnormal   Collection Time: 08/28/15  2:15 PM  Result Value Ref Range   Sodium 158 (H) 135 - 145 mmol/L   Potassium 4.3 3.5 - 5.1 mmol/L   Chloride >130 (HH) 101 - 111 mmol/L    Comment: CRITICAL RESULT CALLED TO, READ BACK BY AND VERIFIED WITH: P.DOWD AT 1551 ON 08/28/15 BY S.VANHOORNE    CO2 19 (L) 22 - 32 mmol/L   Glucose, Bld 104 (H) 65 - 99 mg/dL   BUN 47 (H) 6 - 20 mg/dL   Creatinine, Ser 1.04 (H) 0.44 - 1.00 mg/dL   Calcium 9.4 8.9 - 10.3 mg/dL   Total Protein 7.4 6.5 - 8.1 g/dL   Albumin 2.5 (L) 3.5 - 5.0 g/dL   AST 16 15 - 41 U/L   ALT 11 (L) 14 - 54 U/L   Alkaline Phosphatase 71 38 - 126 U/L   Total Bilirubin 0.6 0.3 - 1.2 mg/dL   GFR calc non Af Amer >60 >60 mL/min   GFR calc Af Amer >60 >60  mL/min    Comment: (NOTE) The eGFR has been calculated using the CKD EPI equation. This calculation has not been validated in all clinical situations. eGFR's persistently <60 mL/min signify possible Chronic Kidney Disease.   CBC WITH DIFFERENTIAL     Status: Abnormal   Collection Time: 08/28/15  2:15 PM  Result Value Ref Range   WBC 15.5 (H) 4.0 - 10.5 K/uL   RBC 3.60 (L) 3.87 - 5.11 MIL/uL   Hemoglobin 10.0 (L) 12.0 - 15.0 g/dL   HCT 31.7 (L) 36.0 - 46.0 %   MCV 88.1 78.0 - 100.0 fL   MCH 27.8 26.0 - 34.0 pg   MCHC 31.5 30.0 - 36.0 g/dL   RDW 15.7 (H) 11.5 - 15.5 %   Platelets 226 150 - 400 K/uL   Neutrophils Relative % 62 %   Neutro Abs 9.6 (H) 1.7 - 7.7 K/uL   Lymphocytes Relative 31 %   Lymphs Abs 4.8 (H) 0.7 - 4.0 K/uL   Monocytes Relative 7 %   Monocytes Absolute 1.0 0.1 - 1.0 K/uL   Eosinophils Relative 0 %   Eosinophils Absolute 0.1 0.0 - 0.7 K/uL   Basophils Relative 0 %   Basophils Absolute 0.0 0.0 - 0.1 K/uL  Urinalysis, Routine w reflex microscopic (not at [facility])     Status: Abnormal   Collection Time: 08/28/15  2:29 PM  Result Value Ref Range   Color, Urine YELLOW YELLOW   APPearance CLOUDY (A) CLEAR   Specific Gravity, Urine 1.023 1.005 - 1.030   pH 5.0 5.0 - 8.0   Glucose, UA NEGATIVE NEGATIVE mg/dL   Hgb urine dipstick NEGATIVE NEGATIVE   Bilirubin Urine NEGATIVE NEGATIVE   Ketones, ur NEGATIVE NEGATIVE mg/dL   Protein, ur NEGATIVE NEGATIVE mg/dL   Urobilinogen, UA 0.2 0.0 - 1.0 mg/dL   Nitrite POSITIVE (A) NEGATIVE   Leukocytes, UA MODERATE (A) NEGATIVE  Urine microscopic-add on     Status: Abnormal   Collection Time: 08/28/15  2:29 PM  Result Value Ref Range   Squamous Epithelial / LPF RARE RARE   WBC, UA 11-20 <3 WBC/hpf   Bacteria, UA MANY (A) RARE   Urine-Other MUCOUS PRESENT  I-Stat CG4 Lactic Acid, ED  (not at  [facility])     Status: None   Collection Time: 08/28/15  2:33 PM  Result Value Ref Range   Lactic Acid, Venous 1.78 0.5 - 2.0 mmol/L   I-Stat CG4 Lactic Acid, ED  (not at  [facility])     Status: None   Collection Time: 08/28/15  5:50 PM  Result Value Ref Range   Lactic Acid, Venous 1.28 0.5 - 2.0 mmol/L  MRSA PCR Screening     Status: None   Collection Time: 08/28/15  6:00 PM  Result Value Ref Range   MRSA by PCR NEGATIVE NEGATIVE    Comment:        The GeneXpert MRSA Assay (FDA approved for NASAL specimens only), is one component of a comprehensive MRSA colonization surveillance program. It is not intended to diagnose MRSA infection nor to guide or monitor treatment for MRSA infections.   Troponin I     Status: Abnormal   Collection Time: 08/28/15  8:20 PM  Result Value Ref Range   Troponin I 0.05 (H) <0.031 ng/mL    Comment:        PERSISTENTLY INCREASED TROPONIN VALUES IN THE RANGE OF 0.04-0.49 ng/mL CAN BE SEEN IN:       -UNSTABLE ANGINA       -CONGESTIVE HEART FAILURE       -MYOCARDITIS       -CHEST TRAUMA       -ARRYHTHMIAS       -LATE PRESENTING MYOCARDIAL INFARCTION       -COPD   CLINICAL FOLLOW-UP RECOMMENDED.   Procalcitonin     Status: None   Collection Time: 08/28/15  8:20 PM  Result Value Ref Range   Procalcitonin 0.22 ng/mL    Comment:        Interpretation: PCT (Procalcitonin) <= 0.5 ng/mL: Systemic infection (sepsis) is not likely. Local bacterial infection is possible. (NOTE)         ICU PCT Algorithm               Non ICU PCT Algorithm    ----------------------------     ------------------------------         PCT < 0.25 ng/mL                 PCT < 0.1 ng/mL     Stopping of antibiotics            Stopping of antibiotics       strongly encouraged.               strongly encouraged.    ----------------------------     ------------------------------       PCT level decrease by               PCT < 0.25 ng/mL       >= 80% from peak PCT       OR PCT 0.25 - 0.5 ng/mL          Stopping of antibiotics                                             encouraged.     Stopping of antibiotics            encouraged.    ----------------------------     ------------------------------       PCT level decrease  by              PCT >= 0.25 ng/mL       < 80% from peak PCT        AND PCT >= 0.5 ng/mL            Continuin g antibiotics                                              encouraged.       Continuing antibiotics            encouraged.    ----------------------------     ------------------------------     PCT level increase compared          PCT > 0.5 ng/mL         with peak PCT AND          PCT >= 0.5 ng/mL             Escalation of antibiotics                                          strongly encouraged.      Escalation of antibiotics        strongly encouraged.      RADIOLOGY RESULTS: Dg Chest Port 1 View  08/28/2015  CLINICAL DATA:  Hypotension with tachycardia and altered mental status with elevated white blood cell count. EXAM: PORTABLE CHEST 1 VIEW COMPARISON:  06/03/2015 FINDINGS: Lungs are adequately inflated without consolidation or effusion. Cardiomediastinal silhouette and remainder of the exam is unchanged. IMPRESSION: No active disease. Electronically Signed   By: Marin Olp M.D.   On: 08/28/2015 14:40    Problem List: Patient Active Problem List   Diagnosis Date Noted  . Sepsis ([place]) 08/28/2015  . UTI (lower urinary tract infection) 08/28/2015  . Decubitus ulcer of sacral region, stage 4 ([place]) 08/28/2015  . Abscess, sacrum ([place]) 08/28/2015  . Pressure ulcer 06/02/2015  . SAH (subarachnoid hemorrhage) ([place]) 06/01/2015  . Acute encephalopathy 06/01/2015  . Localization-related symptomatic epilepsy and epileptic syndromes with complex partial seizures, not intractable, without status epilepticus ([place]) 05/06/2015  . Hx of ischemic right ACA stroke 05/06/2015  . History of ischemic left ACA stroke 05/06/2015  . C. difficile diarrhea 03/20/2015  . Difficult intravenous access   . Dysphagia   . Hypernatremia 03/16/2015  . Dehydration 03/16/2015  . Seizures  ([place]) 07/05/2014  . Stroke ([place]) 07/05/2014  . Chronic respiratory failure ([place]) 04/07/2014  . Tracheitis 03/31/2014  . Pneumonia 03/31/2014  . HCAP (healthcare-associated pneumonia) 03/07/2014  . DVT (deep venous thrombosis) ([place]) 03/07/2014  . BP (high blood pressure) 09/26/2013  . Aneurysm ([place]) 09/25/2013  . Brain aneurysm 08/17/2013  . Occipital neuralgia 08/17/2013  . Sinus headache 08/17/2013  . Headache(784.0) 06/02/2013  . Palpitations 03/04/2013  . Edema 03/04/2013  . Anxiety state, unspecified 03/04/2013  . Tobacco abuse 02/27/2013  . Chest discomfort 02/10/2013  . Lower abdominal pain 05/01/2012  . Allergic rhinitis 05/01/2012  . HTN (hypertension) 02/25/2012  . Obesity 02/25/2012    Assessment & Plan: Would treat with antibiotics as you have begun.  Saline soaked gauze and hydrotherapy for now.    CCS will follow and debride as necessary.  Injection of the decubitus  with water soluble contrast or dye could be used to assess for fistula between the rectum and the decubitus.  Likewise, CT imaging could be used as well.  This unfortunate lady has a difficult problem and perhaps palliative care could help the family better understand and deal with her circumstances.      Matt B. Hassell Done, MD, [facility] [phone] beeper [phone]  08/28/2015 10:04 PM

## 2015-08-29 ENCOUNTER — Encounter (HOSPITAL_COMMUNITY): Payer: Self-pay

## 2015-08-29 ENCOUNTER — Inpatient Hospital Stay (HOSPITAL_COMMUNITY): Payer: 59

## 2015-08-29 DIAGNOSIS — E669 Obesity, unspecified: Secondary | ICD-10-CM

## 2015-08-29 DIAGNOSIS — A4150 Gram-negative sepsis, unspecified: Secondary | ICD-10-CM

## 2015-08-29 LAB — CBC
HCT: 27.3 % — ABNORMAL LOW (ref 36.0–46.0)
Hemoglobin: 8.7 g/dL — ABNORMAL LOW (ref 12.0–15.0)
MCH: 28.2 pg (ref 26.0–34.0)
MCHC: 31.9 g/dL (ref 30.0–36.0)
MCV: 88.3 fL (ref 78.0–100.0)
PLATELETS: 195 10*3/uL (ref 150–400)
RBC: 3.09 MIL/uL — ABNORMAL LOW (ref 3.87–5.11)
RDW: 15.9 % — AB (ref 11.5–15.5)
WBC: 14.2 10*3/uL — AB (ref 4.0–10.5)

## 2015-08-29 LAB — BASIC METABOLIC PANEL
BUN: 32 mg/dL — AB (ref 6–20)
CALCIUM: 8.6 mg/dL — AB (ref 8.9–10.3)
CO2: 16 mmol/L — ABNORMAL LOW (ref 22–32)
Chloride: >130 mmol/L (ref 101–111)
Creatinine, Ser: 0.71 mg/dL (ref 0.44–1.00)
GFR calc Af Amer: >60 mL/min (ref >60)
GLUCOSE: 103 mg/dL — AB (ref 65–99)
Potassium: 4 mmol/L (ref 3.5–5.1)
Sodium: 157 mmol/L — ABNORMAL HIGH (ref 135–145)

## 2015-08-29 LAB — BRAIN NATRIURETIC PEPTIDE: B NATRIURETIC PEPTIDE 5: 47.2 pg/mL (ref 0.0–100.0)

## 2015-08-29 MED ORDER — IOHEXOL 300 MG/ML  SOLN
25.0000 mL | INTRAMUSCULAR | Status: AC
  Administered 2015-08-29 (×2): 25 mL via ORAL

## 2015-08-29 MED ORDER — SODIUM CHLORIDE 0.45 % IV SOLN
INTRAVENOUS | Status: DC
  Administered 2015-08-29 – 2015-08-31 (×4): via INTRAVENOUS
  Administered 2015-08-31: 100 mL/h via INTRAVENOUS
  Administered 2015-09-01 – 2015-09-04 (×3): via INTRAVENOUS

## 2015-08-29 MED ORDER — METOPROLOL TARTRATE 100 MG PO TABS
100.0000 mg | ORAL_TABLET | Freq: Two times a day (BID) | ORAL | Status: DC
  Administered 2015-08-29 – 2015-09-06 (×16): 100 mg via ORAL
  Filled 2015-08-29: qty 1
  Filled 2015-08-29: qty 4
  Filled 2015-08-29 (×3): qty 1
  Filled 2015-08-29: qty 4
  Filled 2015-08-29: qty 1
  Filled 2015-08-29: qty 4
  Filled 2015-08-29 (×2): qty 1
  Filled 2015-08-29: qty 4
  Filled 2015-08-29 (×2): qty 1
  Filled 2015-08-29: qty 4
  Filled 2015-08-29 (×4): qty 1

## 2015-08-29 MED ORDER — MORPHINE SULFATE (PF) 2 MG/ML IV SOLN
1.0000 mg | INTRAVENOUS | Status: DC
  Administered 2015-08-29 – 2015-09-06 (×41): 1 mg via INTRAVENOUS
  Filled 2015-08-29 (×42): qty 1

## 2015-08-29 MED ORDER — IOHEXOL 300 MG/ML  SOLN
100.0000 mL | Freq: Once | INTRAMUSCULAR | Status: AC | PRN
  Administered 2015-08-29: 100 mL via INTRAVENOUS

## 2015-08-29 NOTE — Consult Note (Signed)
WOC wound consult note Reason for Consult: Stage 4 pressure injury at sacrum.  CCS has been consulted and is following. Dr. Daphine DeutscherMartin saw patient on Sunday, 10/23.  Dr. Carolynne Edouardoth saw earlier today and debrided wound, dressed; also ordered is PT for hydrotherapy and that is scheduled to begin tomorrow. I have discussed with PT and will plan to see patient as needed in conjunction with hydrotherapy and/or CCS MD per their request. It is recommended that patient be placed on a therapeutic mattress upon transfer to floor from the ICU; she is currently on a therapeutic mattress with low air loss feature while in ICU. Orders are provided for this. Wound type:Pressure   Pressure Ulcer POA: Yes WOC nursing team will remain available to this patient, the nursing, surgical and medical teams.  Please re-consult if needed. Thanks, Ladona MowLaurie Puneet Selden, MSN, RN, GNP, [place], CWON-AP [phone])

## 2015-08-29 NOTE — Progress Notes (Signed)
  Subjective: Looks around but she is nonverbal  Objective: Vital signs in last 24 hours: Temp:  [99.3 F (37.4 C)-101.5 F (38.6 C)] 100.2 F (37.9 C) (10/24 0500) Pulse Rate:  [87-139] 107 (10/24 0500) Resp:  [12-52] 19 (10/24 0500) BP: (64-145)/(40-103) 145/78 mmHg (10/24 0500) SpO2:  [96 %-100 %] 99 % (10/24 0500) Weight:  [79.9 kg (176 lb 2.4 oz)] 79.9 kg (176 lb 2.4 oz) (10/23 1820) Last BM Date: 08/28/15  Intake/Output from previous day: 10/23 0701 - 10/24 0700 In: 1288.8 [I.V.:838.8; IV Piggyback:450] Out: 375 [Urine:375] Intake/Output this shift:    Resp: clear to auscultation bilaterally Cardio: regular rate and rhythm GI: soft, nontender Pelvic: Sacral decub. 3X3 cm area of skin and subcutaneous fat debrided sharply with scissors at the bedside. wound packed with kerlix gauze and dressings applied. she tolerated this well  Lab Results:   Recent Labs  08/28/15 1415 08/29/15 0340  WBC 15.5* 14.2*  HGB 10.0* 8.7*  HCT 31.7* 27.3*  PLT 226 195   BMET  Recent Labs  08/28/15 1415 08/29/15 0340  NA 158* 157*  K 4.3 4.0  CL >130* >130*  CO2 19* 16*  GLUCOSE 104* 103*  BUN 47* 32*  CREATININE 1.04* 0.71  CALCIUM 9.4 8.6*   PT/INR No results for input(s): LABPROT, INR in the last 72 hours. ABG No results for input(s): PHART, HCO3 in the last 72 hours.  Invalid input(s): PCO2, PO2  Studies/Results: Dg Chest Port 1 View  08/28/2015  CLINICAL DATA:  Hypotension with tachycardia and altered mental status with elevated white blood cell count. EXAM: PORTABLE CHEST 1 VIEW COMPARISON:  06/03/2015 FINDINGS: Lungs are adequately inflated without consolidation or effusion. Cardiomediastinal silhouette and remainder of the exam is unchanged. IMPRESSION: No active disease. Electronically Signed   By: Elberta Fortisaniel  Boyle M.D.   On: 08/28/2015 14:40    Anti-infectives: Anti-infectives    Start     Dose/Rate Route Frequency Ordered Stop   08/28/15 2200  cefTAZidime  (FORTAZ) 1 g in dextrose 5 % 50 mL IVPB     1 g 100 mL/hr over 30 Minutes Intravenous 3 times per day 08/28/15 1439     08/28/15 2000  vancomycin (VANCOCIN) IVPB 1000 mg/200 mL premix     1,000 mg 200 mL/hr over 60 Minutes Intravenous Every 12 hours 08/28/15 1441     08/28/15 1430  cefTAZidime (FORTAZ) 2 g in dextrose 5 % 50 mL IVPB     2 g 100 mL/hr over 30 Minutes Intravenous  Once 08/28/15 1415 08/28/15 1456   08/28/15 1415  vancomycin (VANCOCIN) IVPB 1000 mg/200 mL premix     1,000 mg 200 mL/hr over 60 Minutes Intravenous  Once 08/28/15 1408 08/28/15 1547      Assessment/Plan: s/p * No surgery found * Continue dressing changes and abx  PT consult for hydrotherapy  LOS: 1 day    TOTH III,Keaun Schnabel S 08/29/2015

## 2015-08-29 NOTE — Progress Notes (Signed)
PT Cancellation Note  Patient Details Name: Meghan PoreLaura Marie Milnes MRN: [id number] DOB: 07/08/1966   Cancelled Treatment:    Reason Eval/Treat Not Completed: Other (comment) (Order for hydrotherapy received. Informed the RN that the evaluation will be performed in the AM. )   Rada HayHill, Taliya Mcclard Elizabeth 08/29/2015, 3:16 PM Blanchard KelchKaren Clementine Soulliere PT [phone]

## 2015-08-29 NOTE — Progress Notes (Signed)
PROGRESS NOTE  [name]:[id number] DOB: 1966-06-12 DOA: 08/28/2015 PCP: Arnette Norris, MD  HPI/Recap of past 24 hours: Patient is a 49 year old female with unfortunate past medical history of ruptured cerebral aneurysm which left her with seizure disorder, nonverbal, nonambulatory who resides in a nursing facility and brought in to the emergency room on 10/23 after having a week of decreasing level of responsiveness. On admission, sodium noted to be 158, white count of 15 and patient felt to be in sepsis secondary to a stage IV sacral decubitus ulcer with large amounts of pus drainage. Patient aggressively hydrated and given antibiotics and admitted to stepdown. Also noted to have urinary tract infection  Today, patient looks to be doing much better. Vital signs stable. White count slowly improving. She is more alert. Seen by general surgery who recommended CT for better determination of abscess. Blood cultures have grown out preliminary gram-negative rods. Awaiting further cultures. Nonverbal, but does shake her head yes when asked if she is having pain  Assessment/Plan: Active Problems:   HTN (hypertension): Holding antihypertensives until blood pressure better stabilized    Obesity: Patient meets criteria with BMI greater than 30    Seizures ([place]): Seizure medications changed to IV, if she continues to remain stable, change back to by mouth (1-1 ratio) tomorrow   Hypernatremia: Patient has had this issue in the past. Likely secondary to dehydration from sepsis. Despite aggressive IV fluids, sodium did not change much overnight, will change to half-normal saline.    Dysphagia: Given improvement in alertness, patient put back on her baseline diet: Dysphagia 1 with thin liquids and full assistance   Sepsis ([place]) secondary to sacral abscess/stage IV decubitus ulcer of sacral region: Patient met criteria on admission given hypotension, leukocytosis, fever and sacral source. Responded  well to aggressive IV fluids and antibiotics. Awaiting wound care consult. Spoke with radiology and CT of pelvis with contrast ordered and is pending. Air mattress ordered.  Have ordered scheduled morphine since patient is unable to communicate pain    UTI (lower urinary tract infection): Will be covered by IV antibiotics    Palliative care encounter: Have asked palliative care for assistance. Spoke with some last night who wanted full code. He is educated and has been through this before, but states that he is never had a discussion about CODE STATUS.   Code Status: Full code as confirmed by patient's son  Family Communication: Spoke with son at the bedside yesterday, left message today  Disposition Plan: Continue in stepdown until white count and sodium improved and CT clarifies extent of infection   Consultants:  General surgery  Palliative care  Procedures:  None  Antibiotics:  IV Fortaz 10/23-present  IV vancomycin 10/23-present   Objective: BP 139/81 mmHg  Pulse 114  Temp(Src) 99.9 F (37.7 C) (Core (Comment))  Resp 20  Ht 5' 7"  (1.702 m)  Wt 79.9 kg (176 lb 2.4 oz)  BMI 27.58 kg/m2  SpO2 99%  Intake/Output Summary (Last 24 hours) at 08/29/15 1421 Last data filed at 08/29/15 1404  Gross per 24 hour  Intake 1473.42 ml  Output    375 ml  Net 1098.42 ml   Filed Weights   08/28/15 1820  Weight: 79.9 kg (176 lb 2.4 oz)    Exam:   General:  Nonverbal, no acute distress  Cardiovascular: Regular rhythm, mild tachycardia  Respiratory: Clear to auscultation bilaterally  Abdomen: Soft, few bowel sounds, nondistended  Musculoskeletal: Trace pitting edema   Data Reviewed:  Basic Metabolic Panel:  Recent Labs Lab 08/28/15 1415 08/29/15 0340  NA 158* 157*  K 4.3 4.0  CL >130* >130*  CO2 19* 16*  GLUCOSE 104* 103*  BUN 47* 32*  CREATININE 1.04* 0.71  CALCIUM 9.4 8.6*   Liver Function Tests:  Recent Labs Lab 08/28/15 1415  AST 16  ALT 11*    ALKPHOS 71  BILITOT 0.6  PROT 7.4  ALBUMIN 2.5*   No results for input(s): LIPASE, AMYLASE in the last 168 hours. No results for input(s): AMMONIA in the last 168 hours. CBC:  Recent Labs Lab 08/28/15 1415 08/29/15 0340  WBC 15.5* 14.2*  NEUTROABS 9.6*  --   HGB 10.0* 8.7*  HCT 31.7* 27.3*  MCV 88.1 88.3  PLT 226 195   Cardiac Enzymes:    Recent Labs Lab 08/28/15 2020  TROPONINI 0.05*   BNP (last 3 results)  Recent Labs  03/18/15 1240 08/29/15 1305  BNP 69.7 47.2    ProBNP (last 3 results) No results for input(s): PROBNP in the last 8760 hours.  CBG: No results for input(s): GLUCAP in the last 168 hours.  Recent Results (from the past 240 hour(s))  Blood Culture (routine x 2)     Status: None (Preliminary result)   Collection Time: 08/28/15  2:15 PM  Result Value Ref Range Status   Specimen Description RIGHT ANTECUBITAL  Final   Special Requests BOTTLES DRAWN AEROBIC AND ANAEROBIC 5CC  Final   Culture  Setup Time   Final    GRAM NEGATIVE RODS AEROBIC BOTTLE ONLY CRITICAL RESULT CALLED TO, READ BACK BY AND VERIFIED WITH: J. THIGPEN,RN AT [id number] ON [id number] BY Rhea Bleacher Performed at [facility]    Culture PENDING  Incomplete   Report Status PENDING  Incomplete  Urine culture     Status: None (Preliminary result)   Collection Time: 08/28/15  2:29 PM  Result Value Ref Range Status   Specimen Description URINE, CATHETERIZED  Final   Special Requests NONE  Final   Culture   Final    >=100,000 COLONIES/mL GRAM NEGATIVE RODS CULTURE REINCUBATED FOR BETTER GROWTH Performed at [facility]    Report Status PENDING  Incomplete  MRSA PCR Screening     Status: None   Collection Time: 08/28/15  6:00 PM  Result Value Ref Range Status   MRSA by PCR NEGATIVE NEGATIVE Final    Comment:        The GeneXpert MRSA Assay (FDA approved for NASAL specimens only), is one component of a comprehensive MRSA colonization surveillance program. It is  not intended to diagnose MRSA infection nor to guide or monitor treatment for MRSA infections.      Studies: Dg Chest Port 1 View  08/28/2015  CLINICAL DATA:  Hypotension with tachycardia and altered mental status with elevated white blood cell count. EXAM: PORTABLE CHEST 1 VIEW COMPARISON:  06/03/2015 FINDINGS: Lungs are adequately inflated without consolidation or effusion. Cardiomediastinal silhouette and remainder of the exam is unchanged. IMPRESSION: No active disease. Electronically Signed   By: Marin Olp M.D.   On: 08/28/2015 14:40    Scheduled Meds: . cefTAZidime (FORTAZ)  IV  1 g Intravenous 3 times per day  . iohexol  25 mL Oral Q1 Hr x 2  . lacosamide (VIMPAT) IV  100 mg Intravenous Q12H  . levETIRAcetam  1,500 mg Intravenous Q12H  .  morphine injection  1 mg Intravenous Q4H  . sodium chloride  3 mL Intravenous Q12H  .  vancomycin  1,000 mg Intravenous Q12H    Continuous Infusions: . sodium chloride 100 mL/hr at 08/29/15 1202     Time spent: 35 minutes  [place] [facility] Pager [phone]. If 7PM-7AM, please contact night-coverage at [email or web address], password [facility] 08/29/2015, 2:21 PM  LOS: 1 day

## 2015-08-29 NOTE — Care Management Note (Signed)
Case Management Note  Patient Details  Name: [name] MRN: [id number] Date of Birth: 04/23/1966  Subjective/Objective:                 Sepsis and ams   Action/Plan:Date: August 29, 2015 Chart reviewed for concurrent status and case management needs. Will continue to follow patient for changes and needs: Meghan Smilinghonda Kathie Posa, RN, BSN, [place]   [phone]   Expected Discharge Date:                  Expected Discharge Plan:  Home/Self Care  In-House Referral:  NA  Discharge planning Services  CM Consult  Post Acute Care Choice:  NA Choice offered to:  NA  DME Arranged:  N/A DME Agency:  NA  HH Arranged:  NA HH Agency:  NA  Status of Service:  In process, will continue to follow  Medicare Important Message Given:    Date Medicare IM Given:    Medicare IM give by:    Date Additional Medicare IM Given:    Additional Medicare Important Message give by:     If discussed at Long Length of Stay Meetings, dates discussed:    Additional Comments:  [name], [name] Lynn, RN 08/29/2015, 10:38 AM

## 2015-08-30 ENCOUNTER — Inpatient Hospital Stay (HOSPITAL_COMMUNITY): Payer: 59

## 2015-08-30 ENCOUNTER — Encounter (HOSPITAL_COMMUNITY): Payer: Self-pay | Admitting: Radiology

## 2015-08-30 DIAGNOSIS — L89154 Pressure ulcer of sacral region, stage 4: Secondary | ICD-10-CM

## 2015-08-30 DIAGNOSIS — A419 Sepsis, unspecified organism: Principal | ICD-10-CM

## 2015-08-30 DIAGNOSIS — N390 Urinary tract infection, site not specified: Secondary | ICD-10-CM

## 2015-08-30 DIAGNOSIS — Z7189 Other specified counseling: Secondary | ICD-10-CM

## 2015-08-30 DIAGNOSIS — E870 Hyperosmolality and hypernatremia: Secondary | ICD-10-CM

## 2015-08-30 LAB — BASIC METABOLIC PANEL
Anion gap: 6 (ref 5–15)
BUN: 19 mg/dL (ref 6–20)
CO2: 18 mmol/L — ABNORMAL LOW (ref 22–32)
CREATININE: 0.65 mg/dL (ref 0.44–1.00)
Calcium: 8.5 mg/dL — ABNORMAL LOW (ref 8.9–10.3)
Chloride: 129 mmol/L — ABNORMAL HIGH (ref 101–111)
GFR calc Af Amer: >60 mL/min (ref >60)
GLUCOSE: 90 mg/dL (ref 65–99)
Potassium: 3.4 mmol/L — ABNORMAL LOW (ref 3.5–5.1)
SODIUM: 153 mmol/L — AB (ref 135–145)

## 2015-08-30 LAB — CBC
HCT: 26.6 % — ABNORMAL LOW (ref 36.0–46.0)
Hemoglobin: 8.3 g/dL — ABNORMAL LOW (ref 12.0–15.0)
MCH: 27.9 pg (ref 26.0–34.0)
MCHC: 31.2 g/dL (ref 30.0–36.0)
MCV: 89.3 fL (ref 78.0–100.0)
PLATELETS: 205 10*3/uL (ref 150–400)
RBC: 2.98 MIL/uL — ABNORMAL LOW (ref 3.87–5.11)
RDW: 16.2 % — AB (ref 11.5–15.5)
WBC: 10.8 10*3/uL — AB (ref 4.0–10.5)

## 2015-08-30 LAB — HEMOGLOBIN AND HEMATOCRIT, BLOOD
HEMATOCRIT: 24.4 % — AB (ref 36.0–46.0)
HEMOGLOBIN: 7.9 g/dL — AB (ref 12.0–15.0)

## 2015-08-30 LAB — HEPARIN LEVEL (UNFRACTIONATED): Heparin Unfractionated: 0.25 IU/mL — ABNORMAL LOW (ref 0.30–0.70)

## 2015-08-30 LAB — OCCULT BLOOD X 1 CARD TO LAB, STOOL: Fecal Occult Bld: POSITIVE — AB

## 2015-08-30 LAB — PROTIME-INR
INR: 1.47 (ref 0.00–1.49)
PROTHROMBIN TIME: 17.9 s — AB (ref 11.6–15.2)

## 2015-08-30 LAB — APTT: aPTT: 29 seconds (ref 24–37)

## 2015-08-30 IMAGING — XA IR IVC FILTER PLMT / S&I /IMG GUID/MOD SED
2 series · 7 of 7 positions shown · IV contrast (IODINE)
Comparison: none

CLINICAL DATA: 49-year-old female with a history of DVT, with anti
coagulation contraindicated

[Series 1: shuntogram · 3 of 13 frames shown (1 of 2)]
[frame 2/13]
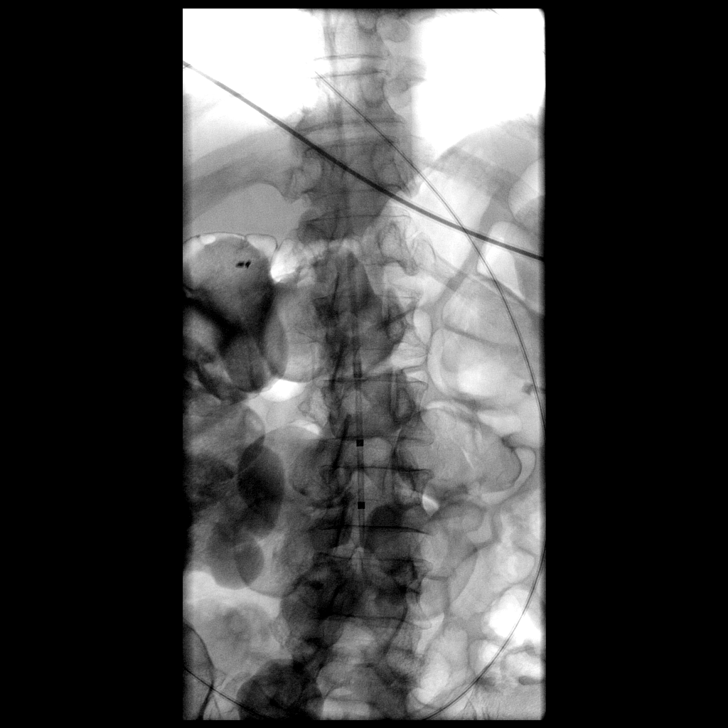
[frame 7/13]
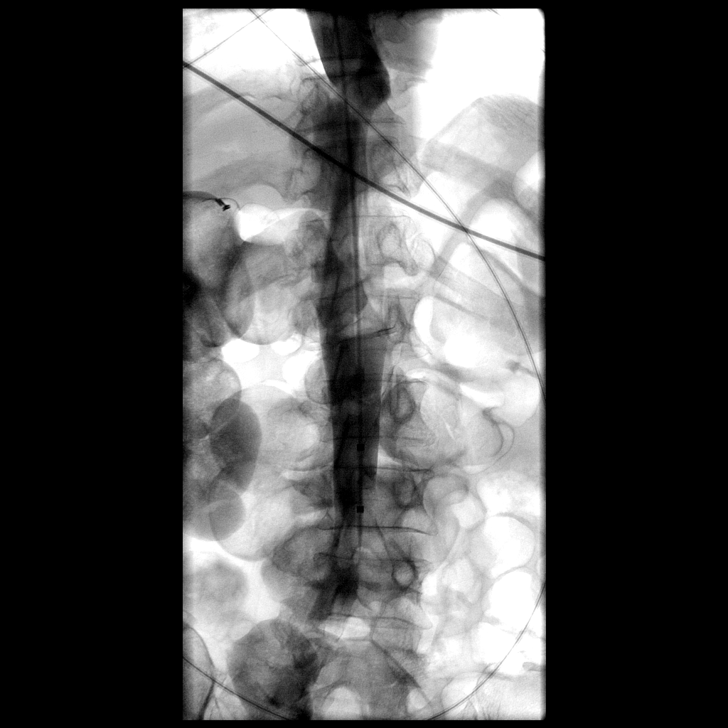
[frame 12/13]
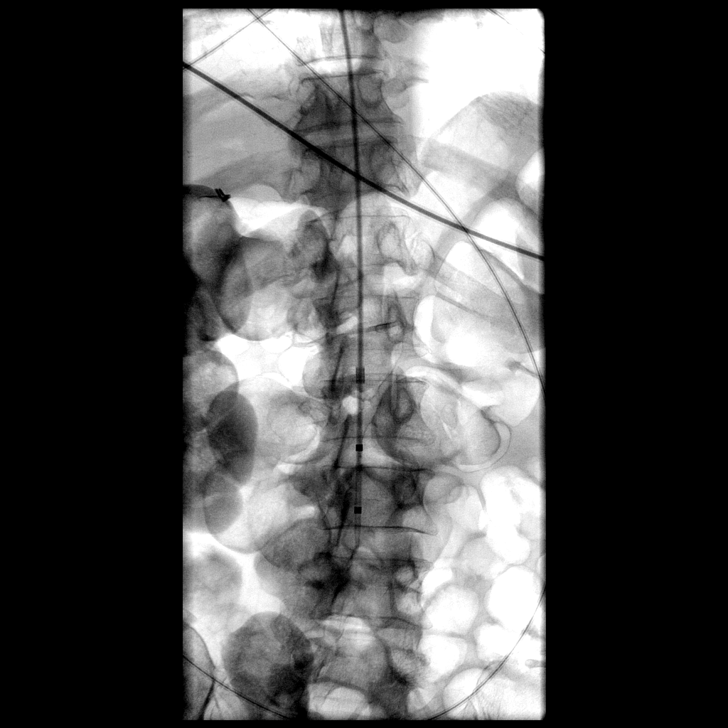

[Series 2: shuntogram · 4 of 17 frames shown (2 of 2)]
[frame 3/17]
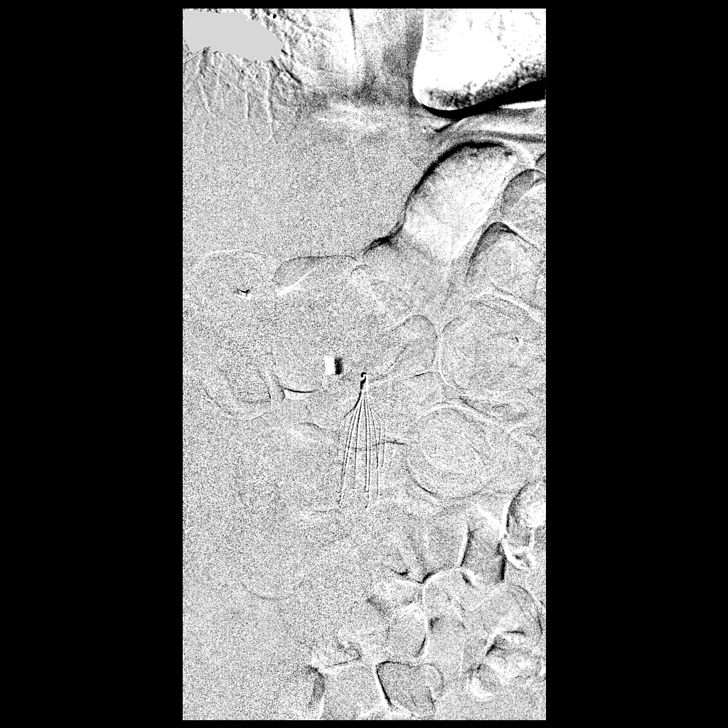
[frame 9/17]
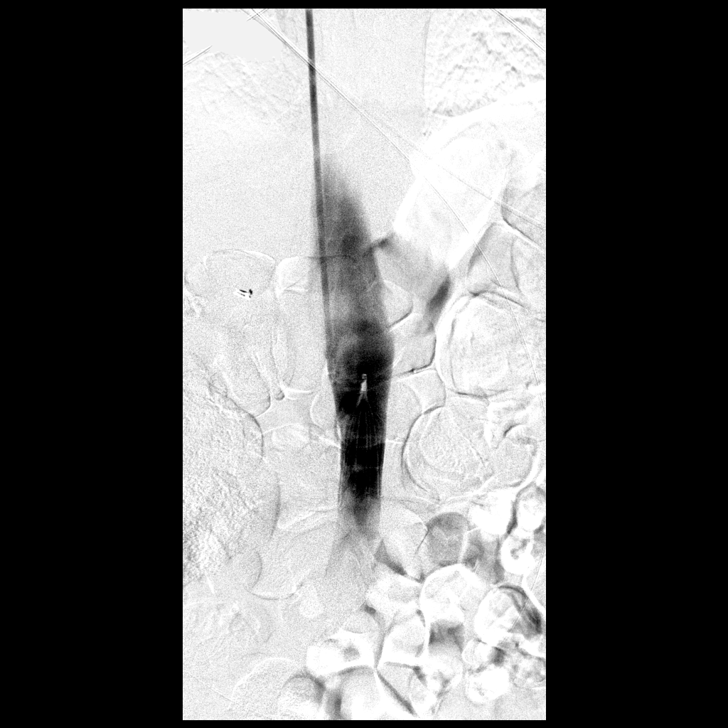
[frame 15/17]
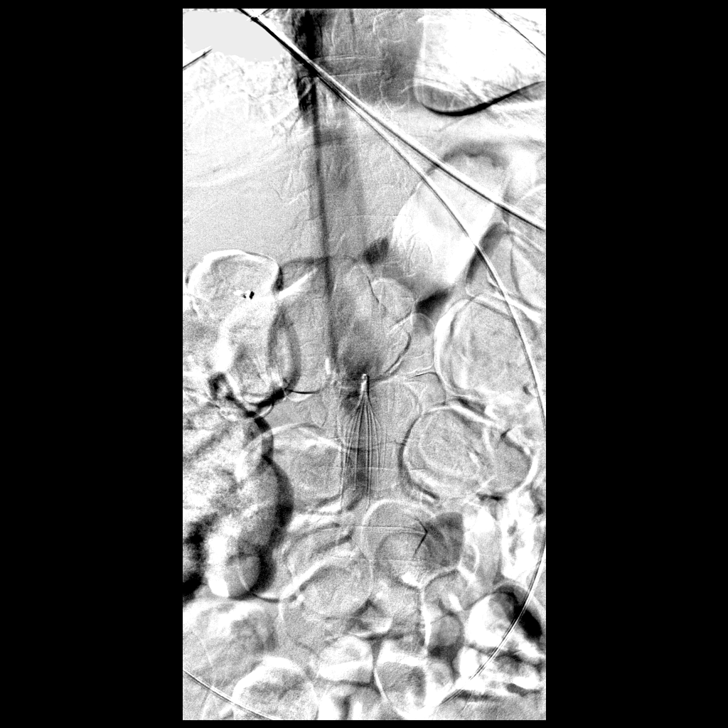
[frame 17/17]
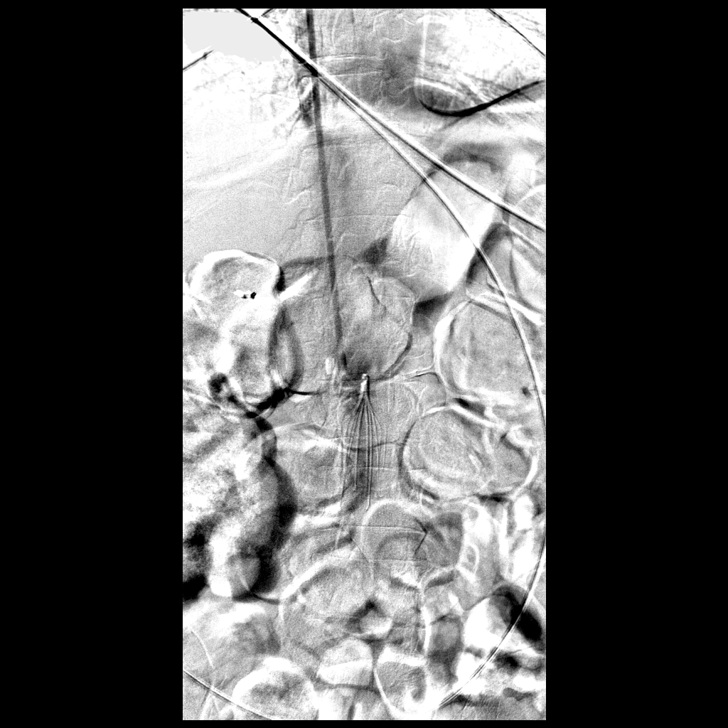

[7 of 7 positions shown; findings below may reference images not displayed]

EXAM:
1. ULTRASOUND GUIDANCE FOR VASCULAR ACCESS OF THE RIGHT INTERNAL
JUGULAR VEIN.
2. IVC VENOGRAM.
3. PERCUTANEOUS IVC FILTER PLACEMENT.

ANESTHESIA/SEDATION:
None

CONTRAST:  34mL OMNIPAQUE IOHEXOL 300 MG/ML  SOLN

FLUOROSCOPY TIME:  48 seconds

PROCEDURE:
The procedure, risks, benefits, and alternatives were explained to
the patient. Specific risks discussed include bleeding, infection,
contrast reaction, renal failure, IVC filter fracture, migration,
ANUBHAV caval thrombus (3% incidence), need for further procedure, need
for further surgery, pulmonary embolism, cardiopulmonary collapse,
death. Questions regarding the procedure were encouraged and
answered. The patient understands and consents to the procedure.

Ultrasound survey was performed with images stored and sent to PACs.

The neck was prepped with Betadine in a sterile fashion, and a
sterile drape was applied covering the operative field. A sterile
gown and sterile gloves were used for the procedure. Local
anesthesia was provided with 1% Lidocaine.

A micropuncture needle was used access the right internal jugular
vein under ultrasound. With excellent venous blood flow returned,
and an .018 micro wire was passed through the needle. Small incision
was made with an 11 blade scalpel. The needle was removed, and a
micropuncture sheath was placed over the wire. The inner dilator and
wire were removed, and an 035 Bentson wire was advanced under
fluoroscopy into the IVC. Serial dilation of the soft tissue tract
was performed with an 8 French dilator and subsequently a 10 French
dilator.

The delivery sheath for a retrievable ANUBHAV filter was passed
over the Bentson wire into the IVC. The wire was removed and small
contrast was used to confirm IVC location.

IVC cavagram performed.

Dilator was removed, and the IVC filter was then delivered,
positioned below the lowest renal vein. Repeat cavagram performed,
and the catheter was removed.

Manual pressure was used for hemostasis.

Patient tolerated the procedure well and remained hemodynamically
stable throughout.

No complications were encountered and no significant blood loss was
encounter.

COMPLICATIONS:
None.
FINDINGS: IVC venography demonstrates a normal caliber IVC with no evidence of
thrombus. Renal veins are identified bilaterally. The IVC filter was
successfully positioned below the level of the renal veins and is
appropriately oriented. This IVC filter has both permanent and
retrievable indications.
IMPRESSION: Status post placement of a retrievable IVC filter.

PLAN:
If the patient is deemed a candidate for anti coagulation, would
recommend referral to [REDACTED] for evaluation
of IVC filter retrieval. If the patient can never be given anti
coagulation and the filter should be permanent, would recommend
retrieval of this temporary/retrieval IVC filter, and placement of a
filter with a permanent indication.

## 2015-08-30 MED ORDER — POTASSIUM CHLORIDE CRYS ER 20 MEQ PO TBCR
40.0000 meq | EXTENDED_RELEASE_TABLET | Freq: Once | ORAL | Status: AC
  Administered 2015-08-30: 40 meq via ORAL
  Filled 2015-08-30: qty 2

## 2015-08-30 MED ORDER — HEPARIN (PORCINE) IN NACL 100-0.45 UNIT/ML-% IJ SOLN
1100.0000 [IU]/h | INTRAMUSCULAR | Status: DC
Start: 2015-08-30 — End: 2015-08-30
  Administered 2015-08-30: 1100 [IU]/h via INTRAVENOUS
  Filled 2015-08-30: qty 250

## 2015-08-30 MED ORDER — HEPARIN BOLUS VIA INFUSION
2000.0000 [IU] | Freq: Once | INTRAVENOUS | Status: AC
  Administered 2015-08-30: 2000 [IU] via INTRAVENOUS
  Filled 2015-08-30: qty 2000

## 2015-08-30 MED ORDER — HEPARIN (PORCINE) IN NACL 100-0.45 UNIT/ML-% IJ SOLN
1300.0000 [IU]/h | INTRAMUSCULAR | Status: DC
  Filled 2015-08-30: qty 250

## 2015-08-30 MED ORDER — LIDOCAINE HCL 1 % IJ SOLN
INTRAMUSCULAR | Status: AC
  Filled 2015-08-30: qty 20

## 2015-08-30 MED ORDER — IOHEXOL 300 MG/ML  SOLN
34.0000 mL | Freq: Once | INTRAMUSCULAR | Status: DC | PRN
  Administered 2015-08-30: 34 mL via INTRAVENOUS
  Filled 2015-08-30: qty 40

## 2015-08-30 NOTE — Progress Notes (Addendum)
Follow-up:  Notified by RN regarding positive findings on ct abd/pelvis. Ct reveals "Extensive occlusive thrombus starting lower IVC at the bifurcation extending in left common iliac vein, external iliac vein, left femoral vein. The right aspect of IVC bifurcation and right iliac veins are patent". Discussed pt w/ Dr Toniann FailKakrakandy. Will obtain ct head w/o cm to r/o any hemorrhagic process. If negative for same w/ start heparin qtt. Will continue to monitor closely in SDU.   Leanne ChangKatherine P. Jolee Critcher, NP-C [facility] Pager [phone]

## 2015-08-30 NOTE — Progress Notes (Signed)
Subjective: Non Verbal, comfortable in be, lab is trying to get some blood.  Objective: Vital signs in last 24 hours: Temp:  [98.2 F (36.8 C)-100.8 F (38.2 C)] 100.6 F (38.1 C) (10/25 0700) Pulse Rate:  [85-141] 124 (10/25 0700) Resp:  [13-40] 20 (10/25 0700) BP: (109-174)/(66-141) 129/98 mmHg (10/25 0700) SpO2:  [96 %-100 %] 96 % (10/25 0700) Last BM Date: 08/29/15  Intake/Output from previous day: 10/24 0701 - 10/25 0700 In: 1485.7 [I.V.:965.7; IV Piggyback:520] Out: 1105 [Urine:1105] Intake/Output this shift:    General appearance: alert and no distress Skin: Skin color, texture, turgor normal. No rashes or lesions or Will examine wound with hydrotherapy.  Plan to do hydro and debride more of this by PT hydrotherapy.    Lab Results:   Recent Labs  08/29/15 0340 08/30/15 0350  WBC 14.2* 10.8*  HGB 8.7* 8.3*  HCT 27.3* 26.6*  PLT 195 205    BMET  Recent Labs  08/29/15 0340 08/30/15 0350  NA 157* 153*  K 4.0 3.4*  CL >130* 129*  CO2 16* 18*  GLUCOSE 103* 90  BUN 32* 19  CREATININE 0.71 0.65  CALCIUM 8.6* 8.5*   PT/INR  Recent Labs  08/30/15 0036  LABPROT 17.9*  INR 1.47     Recent Labs Lab 08/28/15 1415  AST 16  ALT 11*  ALKPHOS 71  BILITOT 0.6  PROT 7.4  ALBUMIN 2.5*     Lipase  No results found for: LIPASE   Studies/Results: Ct Head Wo Contrast  08/29/2015  CLINICAL DATA:  One week of decreasing level of responsiveness. Evaluate for hemorrhage. History of ruptured cerebral aneurysm, seizure, stroke, hypertension. EXAM: CT HEAD WITHOUT CONTRAST TECHNIQUE: Contiguous axial images were obtained from the base of the skull through the vertex without intravenous contrast. COMPARISON:  CT head June 01, 2015 FINDINGS: Mild motion degraded examination. Bilateral mesial frontal lobe encephalomalacia with similar mineralization. Ex vacuo dilatation of bifrontal horns, no hydrocephalus. Patchy areas of LEFT inferior frontotemporal  encephalomalacia extending to the insula. No midline shift, mass effect or mass lesions. No intraparenchymal hemorrhage. No acute large vascular territory infarcts. No abnormal extra-axial fluid collections. Basal cisterns are patent. Status post probable A-comm aneurysm clipping. Ocular globes and orbital contents are unremarkable. 1 cm bony excrescence from inner table RIGHT petrous apex could represent meningioma or, hyperostosis. Remote LEFT frontotemporal craniotomy with all orbital extension old RIGHT frontal burr hole. No skull fracture. IMPRESSION: No acute intracranial process. 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 LEFT frontotemporal encephalomalacia may be postoperative or, MCA territory infarct. Status post remote LEFT craniotomy for aneurysm clipping. Electronically Signed   By: Awilda Metroourtnay  Bloomer M.D.   On: 08/29/2015 23:01   Ct Abdomen Pelvis W Contrast  08/29/2015  CLINICAL DATA:  Sepsis, possible gluteal abscess, sacral decubitus ulcer EXAM: CT ABDOMEN AND PELVIS WITH CONTRAST TECHNIQUE: Multidetector CT imaging of the abdomen and pelvis was performed using the standard protocol following bolus administration of intravenous contrast. CONTRAST:  100mL OMNIPAQUE IOHEXOL 300 MG/ML  SOLN COMPARISON:  None. FINDINGS: Lung bases are unremarkable. Sagittal images of the spine shows mild degenerative changes lower thoracic spine. Mild fatty infiltration of the liver. The patient is status postcholecystectomy. The pancreas, spleen and adrenal glands are unremarkable. No aortic aneurysm. Kidneys are symmetrical in size and enhancement. No hydronephrosis or hydroureter. There is a low-density lesion midpole of the left kidney measures 1.6 cm. Although this may represent a complex cyst a solid lesion cannot be excluded. Further  correlation with ultrasound and MRI is recommended. No small bowel obstruction. Abundant stool noted in right colon and transverse colon. No pericecal  inflammation. Normal appendix. Moderate stool noted in distal sigmoid colon and rectum. There is some enhancement of mucosa in distal sigmoid colon. Mild thickening of distal sigmoid colon wall. Mild colitis cannot be excluded. Delayed images of the pelvis shows a Foley catheter in a decompressed urinary bladder. Moderate stool and liquid noted within rectum. Mild stranding of perianal fat and minimal thickening of anal wall probable due to proctitis. In axial image 9 there is skin defect and soft tissue wound just left to the coccyx region. This is consistent with the known decubitus ulcer. There is no evidence of subcutaneous or gluteal abscess. Minimal soft tissue edema surrounding the anus. Small amount of air within anterior aspect of the urinary bladder probable post instrumentation. Small bilateral inguinal region lymph nodes probable reactive. There is occlusive thrombus starting in lower IVC axial image 52 left common iliac vein, external iliac vein and left femoral vein. IMPRESSION: 1. Extensive occlusive thrombus starting lower IVC at the bifurcation extending in left common iliac vein, external iliac vein, left femoral vein. The right aspect of IVC bifurcation and right iliac veins are patent. 2. There is skin defect consistent with decubitus ulcer left posterior pelvis just lateral to coccygeal region without evidence of definite abscess. No gluteal abscess. 3. Indeterminate low-density lesion midpole of the left kidney. Further correlation with ultrasound and/or MRI is recommended. 4. Abundant stool noted in right colon and transverse colon. Normal appendix. 5. No small bowel obstruction. 6. No hydronephrosis or hydroureter. Mild thickening of distal sigmoid colon wall and mild enhancement of mucosa. Mild wall edema or colitis cannot be entirely excluded. 7. Mild thickening of anal wall.  Mild proctitis cannot be excluded. 8. These results were called by telephone at the time of interpretation on  08/29/2015 at 7:30 pm to patient's ICU nurse Annice Pih who verbally acknowledged these results. Electronically Signed   By: Natasha Mead M.D.   On: 08/29/2015 19:31   Dg Chest Port 1 View  08/28/2015  CLINICAL DATA:  Hypotension with tachycardia and altered mental status with elevated white blood cell count. EXAM: PORTABLE CHEST 1 VIEW COMPARISON:  06/03/2015 FINDINGS: Lungs are adequately inflated without consolidation or effusion. Cardiomediastinal silhouette and remainder of the exam is unchanged. IMPRESSION: No active disease. Electronically Signed   By: Elberta Fortis M.D.   On: 08/28/2015 14:40    Medications: . cefTAZidime (FORTAZ)  IV  1 g Intravenous 3 times per day  . lacosamide (VIMPAT) IV  100 mg Intravenous Q12H  . levETIRAcetam  1,500 mg Intravenous Q12H  . metoprolol  100 mg Oral BID  .  morphine injection  1 mg Intravenous Q4H  . sodium chloride  3 mL Intravenous Q12H  . vancomycin  1,000 mg Intravenous Q12H   . sodium chloride 100 mL/hr at 08/29/15 2300  . 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 Antibiotics:  Ceftazidime/Vancomycin day 3 DVT:  Now on Heparin drip   Plan: Continue hydro Rx and slowly debrided site.  She has bone palpable under this top layer.       LOS: 2 days    Isaish Alemu 08/30/2015

## 2015-08-30 NOTE — Progress Notes (Signed)
PROGRESS NOTE  [name] NTZ:[id number] DOB: 29-Jan-1966 DOA: 08/28/2015 PCP: Arnette Norris, MD  HPI/Recap of past 24 hours: Patient is a 49 year old female with unfortunate past medical history of ruptured cerebral aneurysm which left her with seizure disorder, nonverbal, nonambulatory who resides in a nursing facility and brought in to the emergency room on 10/23 after having a week of decreasing level of responsiveness. On admission, sodium noted to be 158, white count of 15 and patient felt to be in sepsis secondary to a stage IV sacral decubitus ulcer with large amounts of pus drainage. Patient aggressively hydrated and given antibiotics and admitted to stepdown. Also noted to have urinary tract infection  Today, patient looks to be doing much better. Vital signs stable. White count slowly improving. She is more alert. Seen by general surgery who recommended CT for better determination of abscess. Blood cultures have grown out preliminary gram-negative rods. Awaiting further cultures. Nonverbal, but does shake her head yes when asked if she is having pain, had loose stools which are blood tinged.   Assessment/Plan: Active Problems:   HTN (hypertension): Holding antihypertensives , very well controlled.     Obesity: Patient meets criteria with BMI greater than 30    Seizures ([place]): Seizure medications changed to IV, if she continues to remain stable, change back to by mouth (1-1 ratio) tomorrow    Hypernatremia: Patient has had this issue in the past. Likely secondary to dehydration from sepsis. Despite aggressive IV fluids, sodium did not change much overnight, will change to half-normal saline. Improving.   Hypokalemia: replete as needed.     Dysphagia: Given improvement in alertness, patient put back on her baseline diet: Dysphagia 1 with thin liquids and full assistance    Sepsis ([place]) secondary to sacral abscess/stage IV decubitus ulcer of sacral region: Patient met criteria on  admission given hypotension, leukocytosis, fever and sacral source. Responded well to aggressive IV fluids and antibiotics. Wound care consulted and recommendations given. She is getting hydrotherapy with PT. 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 No gluteal abscess. Mild proctitis.   Air mattress ordered.  Have ordered scheduled morphine since patient is unable to communicate pain. She was started on IV heparin overnight for the IV thrombus, but earlier this am, she had blood tinged stools and we had to stop the IV heparin. Repeat h&h slightly dropped to 7.9 from 8.3.   Anemia of blood loss and chronic disease: Baseline hemoglobin around 10, dropped to 7.9. Stool for occult blood is positive and we have stopped the IV heparin.  Get anemia panel.      UTI (lower urinary tract infection):  Urine cultures show gram negative rods. Will be covered by IV antibiotics    Palliative care encounter: Have asked palliative care for assistance. Pending palliative care.   Code Status: Full code as confirmed by patient's son  Family Communication: none at bedside, called son and left message.   Disposition Plan: Continue in stepdown to remain in stepdown.    Consultants:  General surgery  Palliative care  Procedures:  None  Antibiotics:  IV Fortaz 10/23-present  IV vancomycin 10/23-present   Objective: BP 129/98 mmHg  Pulse 124  Temp(Src) 100.6 F (38.1 C) (Oral)  Resp 20  Ht _0  (1.702 m)  Wt 79.9 kg (176 lb 2.4 oz)  BMI 27.58 kg/m2  SpO2 96%  Intake/Output Summary (Last 24 hours) at 08/30/15 1020 Last data filed at 08/30/15 0905  Gross per 24 hour  Intake 1453.67 ml  Output   1105 ml  Net 348.67 ml   Filed Weights   08/28/15 1820  Weight: 79.9 kg (176 lb 2.4 oz)    Exam:   General:   Nonverbal, no acute distress  Cardiovascular: Regular rhythm, mild tachycardia  Respiratory: Clear to auscultation bilaterally  Abdomen: Soft, few bowel sounds, nondistended  Musculoskeletal: Trace pitting edema   Data Reviewed: Basic Metabolic Panel:  Recent Labs Lab 08/28/15 1415 08/29/15 0340 08/30/15 0350  NA 158* 157* 153*  K 4.3 4.0 3.4*  CL >130* >130* 129*  CO2 19* 16* 18*  GLUCOSE 104* 103* 90  BUN 47* 32* 19  CREATININE 1.04* 0.71 0.65  CALCIUM 9.4 8.6* 8.5*   Liver Function Tests:  Recent Labs Lab 08/28/15 1415  AST 16  ALT 11*  ALKPHOS 71  BILITOT 0.6  PROT 7.4  ALBUMIN 2.5*   No results for input(s): LIPASE, AMYLASE in the last 168 hours. No results for input(s): AMMONIA in the last 168 hours. CBC:  Recent Labs Lab 08/28/15 1415 08/29/15 0340 08/30/15 0350  WBC 15.5* 14.2* 10.8*  NEUTROABS 9.6*  --   --   HGB 10.0* 8.7* 8.3*  HCT 31.7* 27.3* 26.6*  MCV 88.1 88.3 89.3  PLT 226 195 205   Cardiac Enzymes:    Recent Labs Lab 08/28/15 2020  TROPONINI 0.05*   BNP (last 3 results)  Recent Labs  03/18/15 1240 08/29/15 1305  BNP 69.7 47.2    ProBNP (last 3 results) No results for input(s): PROBNP in the last 8760 hours.  CBG: No results for input(s): GLUCAP in the last 168 hours.  Recent Results (from the past 240 hour(s))  Blood Culture (routine x 2)     Status: None (Preliminary result)   Collection Time: 08/28/15  2:15 PM  Result Value Ref Range Status   Specimen Description RIGHT ANTECUBITAL  Final   Special Requests BOTTLES DRAWN AEROBIC AND ANAEROBIC 5CC  Final   Culture  Setup Time   Final    GRAM NEGATIVE RODS AEROBIC BOTTLE ONLY CRITICAL RESULT CALLED TO, READ BACK BY AND VERIFIED WITH: J. THIGPEN,RN AT [id number] ON [id number] BY Rhea Bleacher    Culture   Final    GRAM NEGATIVE RODS Performed at [facility]    Report Status PENDING  Incomplete  Blood Culture (routine x 2)     Status: None (Preliminary result)    Collection Time: 08/28/15  2:20 PM  Result Value Ref Range Status   Specimen Description LEFT ANTECUBITAL  Final   Special Requests BOTTLES DRAWN AEROBIC AND ANAEROBIC 5CC  Final   Culture   Final    NO GROWTH < 24 HOURS Performed at [facility]    Report Status PENDING  Incomplete  Urine culture     Status: None (Preliminary result)   Collection Time: 08/28/15  2:29 PM  Result Value Ref Range Status   Specimen Description URINE, CATHETERIZED  Final   Special Requests NONE  Final   Culture   Final    >=100,000 COLONIES/mL GRAM NEGATIVE RODS Performed at [facility]    Report Status PENDING  Incomplete  MRSA PCR Screening     Status: None   Collection Time: 08/28/15  6:00 PM  Result Value Ref Range Status   MRSA by PCR  NEGATIVE NEGATIVE Final    Comment:        The GeneXpert MRSA Assay (FDA approved for NASAL specimens only), is one component of a comprehensive MRSA colonization surveillance program. It is not intended to diagnose MRSA infection nor to guide or monitor treatment for MRSA infections.      Studies: Ct Head Wo Contrast  08/29/2015  CLINICAL DATA:  One week of decreasing level of responsiveness. Evaluate for hemorrhage. History of ruptured cerebral aneurysm, seizure, stroke, hypertension. EXAM: CT HEAD WITHOUT CONTRAST TECHNIQUE: Contiguous axial images were obtained from the base of the skull through the vertex without intravenous contrast. COMPARISON:  CT head June 01, 2015 FINDINGS: Mild motion degraded examination. Bilateral mesial frontal lobe encephalomalacia with similar mineralization. Ex vacuo dilatation of bifrontal horns, no hydrocephalus. Patchy areas of LEFT inferior frontotemporal encephalomalacia extending to the insula. No midline shift, mass effect or mass lesions. No intraparenchymal hemorrhage. No acute large vascular territory infarcts. No abnormal extra-axial fluid collections. Basal cisterns are patent. Status post probable  A-comm aneurysm clipping. Ocular globes and orbital contents are unremarkable. 1 cm bony excrescence from inner table RIGHT petrous apex could represent meningioma or, hyperostosis. Remote LEFT frontotemporal craniotomy with all orbital extension old RIGHT frontal burr hole. No skull fracture. IMPRESSION: No acute intracranial process. 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 LEFT frontotemporal encephalomalacia may be postoperative or, MCA territory infarct. Status post remote LEFT craniotomy for aneurysm clipping. Electronically Signed   By: Elon Alas M.D.   On: 08/29/2015 23:01   Ct Abdomen Pelvis W Contrast  08/29/2015  CLINICAL DATA:  Sepsis, possible gluteal abscess, sacral decubitus ulcer EXAM: CT ABDOMEN AND PELVIS WITH CONTRAST TECHNIQUE: Multidetector CT imaging of the abdomen and pelvis was performed using the standard protocol following bolus administration of intravenous contrast. CONTRAST:  175m OMNIPAQUE IOHEXOL 300 MG/ML  SOLN COMPARISON:  None. FINDINGS: Lung bases are unremarkable. Sagittal images of the spine shows mild degenerative changes lower thoracic spine. Mild fatty infiltration of the liver. The patient is status postcholecystectomy. The pancreas, spleen and adrenal glands are unremarkable. No aortic aneurysm. Kidneys are symmetrical in size and enhancement. No hydronephrosis or hydroureter. There is a low-density lesion midpole of the left kidney measures 1.6 cm. Although this may represent a complex cyst a solid lesion cannot be excluded. Further correlation with ultrasound and MRI is recommended. No small bowel obstruction. Abundant stool noted in right colon and transverse colon. No pericecal inflammation. Normal appendix. Moderate stool noted in distal sigmoid colon and rectum. There is some enhancement of mucosa in distal sigmoid colon. Mild thickening of distal sigmoid colon wall. Mild colitis cannot be excluded. Delayed images of the pelvis  shows a Foley catheter in a decompressed urinary bladder. Moderate stool and liquid noted within rectum. Mild stranding of perianal fat and minimal thickening of anal wall probable due to proctitis. In axial image 9 there is skin defect and soft tissue wound just left to the coccyx region. This is consistent with the known decubitus ulcer. There is no evidence of subcutaneous or gluteal abscess. Minimal soft tissue edema surrounding the anus. Small amount of air within anterior aspect of the urinary bladder probable post instrumentation. Small bilateral inguinal region lymph nodes probable reactive. There is occlusive thrombus starting in lower IVC axial image 52 left common iliac vein, external iliac vein and left femoral vein. IMPRESSION: 1. Extensive occlusive thrombus starting lower IVC at the bifurcation extending in left common iliac vein, external iliac vein, left  femoral vein. The right aspect of IVC bifurcation and right iliac veins are patent. 2. There is skin defect consistent with decubitus ulcer left posterior pelvis just lateral to coccygeal region without evidence of definite abscess. No gluteal abscess. 3. Indeterminate low-density lesion midpole of the left kidney. Further correlation with ultrasound and/or MRI is recommended. 4. Abundant stool noted in right colon and transverse colon. Normal appendix. 5. No small bowel obstruction. 6. No hydronephrosis or hydroureter. Mild thickening of distal sigmoid colon wall and mild enhancement of mucosa. Mild wall edema or colitis cannot be entirely excluded. 7. Mild thickening of anal wall.  Mild proctitis cannot be excluded. 8. These results were called by telephone at the time of interpretation on 08/29/2015 at 7:30 pm to patient's ICU nurse Kennyth Lose who verbally acknowledged these results. Electronically Signed   By: Lahoma Crocker M.D.   On: 08/29/2015 19:31    Scheduled Meds: . cefTAZidime (FORTAZ)  IV  1 g Intravenous 3 times per day  . lacosamide  (VIMPAT) IV  100 mg Intravenous Q12H  . levETIRAcetam  1,500 mg Intravenous Q12H  . metoprolol  100 mg Oral BID  .  morphine injection  1 mg Intravenous Q4H  . sodium chloride  3 mL Intravenous Q12H  . vancomycin  1,000 mg Intravenous Q12H    Continuous Infusions: . sodium chloride 100 mL/hr at 08/29/15 2300  . heparin 1,100 Units/hr (08/30/15 0047)     Time spent: 35 minutes  [facility] Pager [phone] If 7PM-7AM, please contact night-coverage at [email or web address], password [facility] 08/30/2015, 10:20 AM  LOS: 2 days

## 2015-08-30 NOTE — Procedures (Signed)
Interventional Radiology Procedure Note  Procedure: Placement of a Denali IVC filter,  right IJ approach.  Positioned below the lowest renal vein.  Complications: None Recommendations:  - Routine wound care - May be retrieved if patient is deemed a candidate for anti-coagulation.  Signed,  Yvone NeuJaime S. Loreta AveWagner, DO

## 2015-08-30 NOTE — Progress Notes (Addendum)
ANTICOAGULATION CONSULT NOTE - Follow Up Consult  Pharmacy Consult for Heparin IV Indication: IVC thrombus  Allergies  Allergen Reactions  . Amlodipine Swelling  . Penicillins Swelling  . Latex Rash    Unknown   . Other Other (See Comments)    Natural Rubber- Unknown     Patient Measurements: Height: 5\' 7"  (170.2 cm) Weight: 176 lb 2.4 oz (79.9 kg) IBW/kg (Calculated) : 61.6 Heparin Dosing Weight: 66 kg  Vital Signs: Temp: 100.6 F (38.1 C) (10/25 0700) Temp Source: Oral (10/25 0000) BP: 129/98 mmHg (10/25 0700) Pulse Rate: 124 (10/25 0700)  Labs:  Recent Labs  08/28/15 1415 08/28/15 2020 08/29/15 0340 08/30/15 0036 08/30/15 0350  HGB 10.0*  --  8.7*  --  8.3*  HCT 31.7*  --  27.3*  --  26.6*  PLT 226  --  195  --  205  APTT  --   --   --  29  --   LABPROT  --   --   --  17.9*  --   INR  --   --   --  1.47  --   CREATININE 1.04*  --  0.71  --  0.65  TROPONINI  --  0.05*  --   --   --     Estimated Creatinine Clearance: 93.5 mL/min (by C-G formula based on Cr of 0.65).   Medications:  Infusions:  . sodium chloride 100 mL/hr at 08/29/15 2300  . 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  To begin heparin gtt per pharmacy.   Baseline INR, aPTT: wnl  Prior anticoagulation: none  CrCl stable at >90 ml/min CG/N but likely overestimate with partial paralysis  Significant events:  Today, 08/30/2015:  CBC: Hgb low but stable since yesterday, likely dilutional following fluid resuscitation for sepsis.  Plt wnl  Most recent heparin level sl subtherapeutic on 1100 units/hr  No bleeding or infusion issues per nursing, although RN reports night nurse thought one BM may have had some pinkish tinge to it but not documented in chart as such.  RN will follow and will inform MD.  Goal of Therapy: Heparin level 0.3-0.7  units/ml Monitor platelets by anticoagulation protocol: Yes  Plan:  Increase IV Heparin infusion to 1300 units/hr  Check heparin level 6 hrs after rate change  Daily CBC, daily heparin level once stable  Monitor for signs of bleeding or thrombosis, particularly stool characteristics   Bernadene Personrew Lavida Patch, PharmD Pager: [phone] 08/30/2015, 8:49 AM    ADDENDUM  RN informed pharmacist that patient had new BM with significant red coloring and odor consistent with bloody stool.  Asked RN to hold heparin and contact MD who agreed with holding heparin pending further workup.  Bernadene Personrew Shekira Drummer, PharmD, BCPS Pager: [phone] 08/30/2015, 2:17 PM

## 2015-08-30 NOTE — Consult Note (Signed)
Consultation Note Date: 08/30/2015   Patient Name: [name]  DOB: [birth year]  MRN: [id number]  Age / Sex: 49 y.o., female  PCP: Dianne Dun, MD Referring Physician: Kathlen Mody, MD  Reason for Consultation: Establishing goals of care and Psychosocial/spiritual support    Clinical Assessment/Narrative:  49 y.o. 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 Over the past week, she was noted to be less and less responsive and she was brought into the emergency room. At that time her sodium was noted to be 158 (136 2-1/2 months ago), white count of 15 and during evaluation of patient and her chronic stage IV sacral decubitus ulcer, large amount of yellowish fluid felt to be pus drained out of this. Patient started on aggressive IV fluids and antibiotics. Lactic acid level normal. Patient also found to have a urinary tract infection. Admitted for further evaluation and treatment   This NP reviewed medical records, received report from team, assessed the patient and then meet at the patient's bedside along with her family to include [name]/son/HPOA, sister [name] and brother [name] by telephone  to discuss diagnosis, prognosis, GOC, EOL wishes disposition and options.  Patient has had continued slow physical, functional cognitive decline over the past two years.  Family is very frustrated with limited care options in the community. They continue to face advanced directive decisions and anticipatory care needs.  A detailed discussion was had today regarding advanced directives.  Concepts specific to code status, artifical feeding and hydration, continued IV antibiotics and rehospitalization was had.  The difference between a aggressive medical intervention path  and a palliative comfort care  path for this patient at this time was had.  Values and goals of care important to patient and family were attempted to be elicited.  Concept of Hospice and Palliative Care were discussed  Natural trajectory and expectations at EOL were discussed.  Questions and concerns addressed.  Family encouraged to call with questions or concerns.  PMT will continue to support holistically.   HCPOA: yes    SUMMARY OF RECOMMENDATIONS  At this time family is open to all offered and available medical interventions to prolong life.  They remain hopeful for improvement   Code Status/Advance Care Planning:   Full code -family encouraged to consider a DNR/DNI understanding limited positive outcomes in similar patients        Code Status Orders        Start     Ordered   08/28/15 1821  Full code   Continuous     08/28/15 1820    Advance Directive Documentation        Most Recent Value   Type of Advance Directive  Living will   Pre-existing out of facility DNR order (yellow form or pink MOST form)     "MOST" Form in Place?         Palliative Prophylaxis:   Aspiration, Bowel Regimen, Delirium Protocol, Frequent Pain Assessment, Oral Care, Palliative Wound Care and Turn Reposition   Psycho-social/Spiritual:  Support System: Strong Desire for further Chaplaincy support:no Additional Recommendations: Education on Hospice  Prognosis: Unable to determine  Discharge Planning: Pending outcomes  Chief Complaint/ Primary Diagnoses: Present on Admission:  . Sepsis (HCC) . Obesity . Hypernatremia . HTN (hypertension) . UTI (lower urinary tract infection) . Decubitus ulcer of sacral region, stage 4 (HCC) . Abscess, sacrum (HCC)  I  have reviewed the medical record, interviewed the patient and family, and examined the patient. The following aspects are pertinent.  Past Medical History  Diagnosis Date  . Hypertension   . SAH (subarachnoid hemorrhage) (HCC)   . Seizure (HCC)   .  Stroke (HCC)   . Tracheostomy status (HCC)   . Tracheomalacia   . DVT (deep venous thrombosis) (HCC)    Social History   Social History  . Marital Status: Single    Spouse Name: N/A  . Number of Children: N/A  . Years of Education: N/A   Social History Main Topics  . Smoking status: Former Smoker    Types: Cigarettes  . Smokeless tobacco: Never Used     Comment: trying to quit-2 cigarettes daily  . Alcohol Use: No     Comment: Very seldom  . Drug Use: No  . Sexual Activity: No   Other Topics Concern  . None   Social History Narrative   Family History  Problem Relation Age of Onset  . Heart disease Mother 5545    MI   Scheduled Meds: . cefTAZidime (FORTAZ)  IV  1 g Intravenous 3 times per day  . lacosamide (VIMPAT) IV  100 mg Intravenous Q12H  . levETIRAcetam  1,500 mg Intravenous Q12H  . metoprolol  100 mg Oral BID  .  morphine injection  1 mg Intravenous Q4H  . sodium chloride  3 mL Intravenous Q12H  . vancomycin  1,000 mg Intravenous Q12H   Continuous Infusions: . sodium chloride 100 mL/hr at 08/30/15 1456   PRN Meds:.sodium chloride, acetaminophen **OR** acetaminophen, morphine injection, ondansetron **OR** ondansetron (ZOFRAN) IV Medications Prior to Admission:  Prior to Admission medications   Medication Sig Start Date End Date Taking? Authorizing Provider  acetaminophen (TYLENOL) 325 MG tablet Take 2 tablets (650 mg total) by mouth daily. Patient taking differently: Take 650 mg by mouth daily as needed for moderate pain or fever. 2 tablets given every day, may take additional tablets PRN for fever above 100 F 03/22/15  Yes Hollice EspySendil K Krishnan, MD  acetaminophen (TYLENOL) 650 MG suppository Place 650 mg rectally every 4 (four) hours as needed for fever. For temp. 100 F or above   Yes Historical Provider, MD  Amino Acids-Protein Hydrolys (FEEDING SUPPLEMENT, PRO-STAT SUGAR FREE 64,) LIQD Take 30 mLs by mouth daily.   Yes Historical Provider, MD  Ascorbic Acid  (VITAMIN C) 1000 MG tablet Take 1,000 mg by mouth daily.   Yes Historical Provider, MD  carboxymethylcellulose (REFRESH PLUS) 0.5 % SOLN Place 2 drops into both eyes 3 (three) times daily.   Yes Historical Provider, MD  Cholecalciferol (VITAMIN D3) 5000 UNITS TABS Take 1 tablet by mouth daily with breakfast.   Yes Historical Provider, MD  cholestyramine light (PREVALITE) 4 G packet Take 4 g by mouth 3 (three) times daily. mix in 8 oz. Of Liquid   Yes Historical Provider, MD  cloNIDine (CATAPRES - DOSED IN MG/24 HR) 0.3 mg/24hr patch Place 1 patch (0.3 mg total) onto the skin once a week. 06/06/15  Yes Renae FickleMackenzie Short, MD  famotidine (PEPCID) 40 MG/5ML suspension Take 2.5 mLs (20 mg total) by mouth every 12 (twelve) hours. 03/22/15  Yes Hollice EspySendil K Krishnan, MD  feeding supplement, ENSURE ENLIVE, (ENSURE ENLIVE) LIQD Take 237 mLs by mouth 2 (two) times daily between meals. Patient taking differently: Take 237 mLs by mouth 3 (three) times daily between meals.  06/06/15  Yes Renae FickleMackenzie Short, MD  lacosamide (VIMPAT) 50 MG  TABS tablet Take 2 tablets (100 mg total) by mouth 2 (two) times daily. 06/06/15  Yes Renae Fickle, MD  levETIRAcetam (KEPPRA) 750 MG tablet Take 1,500 mg by mouth 2 (two) times daily.   Yes Historical Provider, MD  lisinopril (PRINIVIL,ZESTRIL) 10 MG tablet Take 10 mg by mouth daily.   Yes Historical Provider, MD  loperamide (IMODIUM A-D) 2 MG tablet Take 4 mg by mouth 4 (four) times daily as needed for diarrhea or loose stools.   Yes Historical Provider, MD  metoprolol (LOPRESSOR) 100 MG tablet Take 1 tablet (100 mg total) by mouth 2 (two) times daily. 03/22/15  Yes Hollice Espy, MD  mirtazapine (REMERON) 7.5 MG tablet Take 7.5 mg by mouth at bedtime.   Yes Historical Provider, MD  Multiple Vitamin (MULTIVITAMIN WITH MINERALS) TABS tablet Take 1 tablet by mouth daily with breakfast.   Yes Historical Provider, MD  traZODone (DESYREL) 50 MG tablet Take 25 mg by mouth at bedtime as needed  for sleep.    Yes Historical Provider, MD  minoxidil (LONITEN) 10 MG tablet Take 1 tablet (10 mg total) by mouth 2 (two) times daily. Patient not taking: Reported on 08/28/2015 06/06/15   Renae Fickle, MD   Allergies  Allergen Reactions  . Amlodipine Swelling  . Penicillins Swelling  . Latex Rash    Unknown   . Other Other (See Comments)    Natural Rubber- Unknown    CBC:    Component Value Date/Time   WBC 10.8* 08/30/2015 0350   WBC 8.9 08/11/2013 1521   HGB 7.9* 08/30/2015 1355   HGB 13.8 08/11/2013 1521   HCT 24.4* 08/30/2015 1355   HCT 41.5 08/11/2013 1521   PLT 205 08/30/2015 0350   PLT 189 08/11/2013 1521   MCV 89.3 08/30/2015 0350   MCV 88 08/11/2013 1521   NEUTROABS 9.6* 08/28/2015 1415   NEUTROABS 4.5 02/13/2013 0400   LYMPHSABS 4.8* 08/28/2015 1415   LYMPHSABS 2.2 02/13/2013 0400   MONOABS 1.0 08/28/2015 1415   MONOABS 0.9 02/13/2013 0400   EOSABS 0.1 08/28/2015 1415   EOSABS 0.1 02/13/2013 0400   BASOSABS 0.0 08/28/2015 1415   BASOSABS 0.1 02/13/2013 0400   Comprehensive Metabolic Panel:    Component Value Date/Time   NA 153* 08/30/2015 0350   NA 137 08/11/2013 1521   K 3.4* 08/30/2015 0350   K 3.4* 08/11/2013 1521   CL 129* 08/30/2015 0350   CL 102 08/11/2013 1521   CO2 18* 08/30/2015 0350   CO2 27 08/11/2013 1521   BUN 19 08/30/2015 0350   BUN 15 08/11/2013 1521   CREATININE 0.65 08/30/2015 0350   CREATININE 0.99 08/11/2013 1521   GLUCOSE 90 08/30/2015 0350   GLUCOSE 92 08/11/2013 1521   CALCIUM 8.5* 08/30/2015 0350   CALCIUM 8.7 08/11/2013 1521   AST 16 08/28/2015 1415   AST 24 08/11/2013 1521   ALT 11* 08/28/2015 1415   ALT 31 08/11/2013 1521   ALKPHOS 71 08/28/2015 1415   ALKPHOS 104 08/11/2013 1521   BILITOT 0.6 08/28/2015 1415   BILITOT 0.3 08/11/2013 1521   PROT 7.4 08/28/2015 1415   PROT 8.0 08/11/2013 1521   ALBUMIN 2.5* 08/28/2015 1415   ALBUMIN 3.9 08/11/2013 1521    Review of Systems  Unable to perform ROS   Physical  Exam  Constitutional: She appears well-developed.  HENT:  Head: Normocephalic.  Cardiovascular: Normal rate, regular rhythm and normal heart sounds.   Respiratory:  Decreased in bases  GI: Soft. Bowel  sounds are normal.  Neurological: She is alert.  Non verbal, unable to follow comands  Skin: Skin is warm and dry.  Noted wound documentation per EMR    Vital Signs: BP 110/45 mmHg  Pulse 121  Temp(Src) 100.4 F (38 C) (Oral)  Resp 30  Ht  (1.702 m)  Wt 79.9 kg (176 lb 2.4 oz)  BMI 27.58 kg/m2  SpO2 97% SpO2: Last BM Date: 08/29/15  O2 Device:SpO2: 97 % O2 Flow Rate: .O2 Flow Rate (L/min): 2 L/min Intake/output summary:  Intake/Output Summary (Last 24 hours) at 08/30/15 1501 Last data filed at 08/30/15 1200  Gross per 24 hour  Intake   2987 ml  Output   1105 ml  Net   1882 ml   LBM:  BMP Latest Ref Rng 08/30/2015 08/29/2015 08/28/2015  Glucose 65 - 99 mg/dL 90 161(W) 960(A)  BUN 6 - 20 mg/dL 19 54(U) 98(J)  Creatinine 0.44 - 1.00 mg/dL 1.91 4.78 2.95(A)  Sodium 135 - 145 mmol/L 153(H) 157(H) 158(H)  Potassium 3.5 - 5.1 mmol/L 3.4(L) 4.0 4.3  Chloride 101 - 111 mmol/L 129(H) >130(HH) >130(HH)  CO2 22 - 32 mmol/L 18(L) 16(L) 19(L)  Calcium 8.9 - 10.3 mg/dL 2.1(H) 0.8(M) 9.4    Baseline Weight: Weight: 79.9 kg (176 lb 2.4 oz) Most recent weight: Weight: 79.9 kg (176 lb 2.4 oz)      Palliative Assessment/Data:  Flowsheet Rows        Most Recent Value   Intake Tab    Referral Department  Hospitalist   Unit at Time of Referral  ICU   Palliative Care Primary Diagnosis  Neurology   Date Notified  08/29/15   Palliative Care Type  New Palliative care   Reason for referral  Clarify Goals of Care   Date of Admission  08/28/15   Date first seen by Palliative Care  08/29/15   # of days Palliative referral response time  0 Day(s)   # of days IP prior to Palliative referral  1   Clinical Assessment    Psychosocial & Spiritual Assessment    Palliative Care Outcomes         Additional Data Reviewed: Recent Labs     08/29/15  0340  08/30/15  0350  08/30/15  1355  WBC  14.2*  10.8*   --   HGB  8.7*  8.3*  7.9*  PLT  195  205   --   NA  157*  153*   --   BUN  32*  19   --   CREATININE  0.71  0.65   --     Time In: 1430 Time Out: 1600 Time Total: 90 min Greater than 50%  of this time was spent counseling and coordinating care related to the above assessment and plan. Discussed with Dr Blake Divine  Signed by: Lorinda Creed, NP  Canary Brim, NP  08/30/2015, 3:01 PM  Please contact Palliative Medicine Team phone at [phone] for questions and concerns.

## 2015-08-30 NOTE — H&P (Signed)
Chief Complaint: Patient was seen in consultation today for IVC Thrombus with extending LLE DVT Chief Complaint  Patient presents with  . Hypotension  . Altered Mental Status   at the request of [facility] Dr. Blake Divine  Referring Physician(s): [facility] Dr. Blake Divine  History of Present Illness: [name] is a 49 y.o. 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 She has stage IV sacral decubitus ulcer with bacteremia, Bcx E. Coli. She has history of ruptured cerebral aneurysm, seizure disorder and is nonverbal. IR received request for IVC filter placement. Family is present today and provides some history.   Past Medical History  Diagnosis Date  . Hypertension   . SAH (subarachnoid hemorrhage) (HCC)   . Seizure (HCC)   . Stroke (HCC)   . Tracheostomy status (HCC)   . Tracheomalacia   . DVT (deep venous thrombosis) [facility])     Past Surgical History  Procedure Laterality Date  . Abdominal hysterectomy    . Cholecystectomy      Allergies: Amlodipine; Penicillins; Latex; and Other  Medications: Prior to Admission medications   Medication Sig Start Date End Date Taking? Authorizing Provider  acetaminophen (TYLENOL) 325 MG tablet Take 2 tablets (650 mg total) by mouth daily. Patient taking differently: Take 650 mg by mouth daily as needed for moderate pain or fever. 2 tablets given every day, may take additional tablets PRN for fever above 100 F 03/22/15  Yes Hollice Espy, MD  acetaminophen (TYLENOL) 650 MG suppository Place 650 mg rectally every 4 (four) hours as needed for fever. For temp. 100 F or above   Yes Historical Provider, MD  Amino Acids-Protein Hydrolys (FEEDING SUPPLEMENT, PRO-STAT SUGAR FREE 64,) LIQD Take 30 mLs by mouth daily.   Yes Historical Provider, MD  Ascorbic Acid (VITAMIN C) 1000 MG tablet Take 1,000 mg by mouth daily.   Yes Historical Provider, MD  carboxymethylcellulose (REFRESH PLUS) 0.5 %  SOLN Place 2 drops into both eyes 3 (three) times daily.   Yes Historical Provider, MD  Cholecalciferol (VITAMIN D3) 5000 UNITS TABS Take 1 tablet by mouth daily with breakfast.   Yes Historical Provider, MD  cholestyramine light (PREVALITE) 4 G packet Take 4 g by mouth 3 (three) times daily. mix in 8 oz. Of Liquid   Yes Historical Provider, MD  cloNIDine (CATAPRES - DOSED IN MG/24 HR) 0.3 mg/24hr patch Place 1 patch (0.3 mg total) onto the skin once a week. 06/06/15  Yes Renae Fickle, MD  famotidine (PEPCID) 40 MG/5ML suspension Take 2.5 mLs (20 mg total) by mouth every 12 (twelve) hours. 03/22/15  Yes Hollice Espy, MD  feeding supplement, ENSURE ENLIVE, (ENSURE ENLIVE) LIQD Take 237 mLs by mouth 2 (two) times daily between meals. Patient taking differently: Take 237 mLs by mouth 3 (three) times daily between meals.  06/06/15  Yes Renae Fickle, MD  lacosamide (VIMPAT) 50 MG TABS tablet Take 2 tablets (100 mg total) by mouth 2 (two) times daily. 06/06/15  Yes Renae Fickle, MD  levETIRAcetam (KEPPRA) 750 MG tablet Take 1,500 mg by mouth 2 (two) times daily.   Yes Historical Provider, MD  lisinopril (PRINIVIL,ZESTRIL) 10 MG tablet Take 10 mg by mouth daily.   Yes Historical Provider, MD  loperamide (IMODIUM A-D) 2 MG tablet Take 4 mg by mouth 4 (four) times daily as needed for diarrhea or loose stools.   Yes Historical Provider, MD  metoprolol (  LOPRESSOR) 100 MG tablet Take 1 tablet (100 mg total) by mouth 2 (two) times daily. 03/22/15  Yes Hollice Espy, MD  mirtazapine (REMERON) 7.5 MG tablet Take 7.5 mg by mouth at bedtime.   Yes Historical Provider, MD  Multiple Vitamin (MULTIVITAMIN WITH MINERALS) TABS tablet Take 1 tablet by mouth daily with breakfast.   Yes Historical Provider, MD  traZODone (DESYREL) 50 MG tablet Take 25 mg by mouth at bedtime as needed for sleep.    Yes Historical Provider, MD  minoxidil (LONITEN) 10 MG tablet Take 1 tablet (10 mg total) by mouth 2 (two) times  daily. Patient not taking: Reported on 08/28/2015 06/06/15   Renae Fickle, MD     Family History  Problem Relation Age of Onset  . Heart disease Mother 63    MI    Social History   Social History  . Marital Status: Single    Spouse Name: N/A  . Number of Children: N/A  . Years of Education: N/A   Social History Main Topics  . Smoking status: Former Smoker    Types: Cigarettes  . Smokeless tobacco: Never Used     Comment: trying to quit-2 cigarettes daily  . Alcohol Use: No     Comment: Very seldom  . Drug Use: No  . Sexual Activity: No   Other Topics Concern  . None   Social History Narrative    Review of Systems: Nonverbal unable to be obtained  Review of Systems  Vital Signs: BP 163/107 mmHg  Pulse 101  Temp(Src) 99.7 F (37.6 C) (Oral)  Resp 14  Ht 5\' 7"  (1.702 m)  Wt 176 lb 2.4 oz (79.9 kg)  BMI 27.58 kg/m2  SpO2 97%  Physical Exam  Constitutional: No distress.  HENT:  Head: Normocephalic and atraumatic.  Cardiovascular: Regular rhythm.  Exam reveals no gallop and no friction rub.   No murmur heard. Tachycardic  Pulmonary/Chest: Effort normal and breath sounds normal. No respiratory distress. She has no wheezes. She has no rales.  Abdominal: Soft.  Neurological:  Non-verbal  Skin: She is not diaphoretic.    Mallampati Score:  MD Evaluation Airway: WNL Heart: WNL Abdomen: WNL Chest/ Lungs: WNL ASA  Classification: 3 Mallampati/Airway Score: Two  Imaging: Ct Head Wo Contrast  08/29/2015  CLINICAL DATA:  One week of decreasing level of responsiveness. Evaluate for hemorrhage. History of ruptured cerebral aneurysm, seizure, stroke, hypertension. EXAM: CT HEAD WITHOUT CONTRAST TECHNIQUE: Contiguous axial images were obtained from the base of the skull through the vertex without intravenous contrast. COMPARISON:  CT head June 01, 2015 FINDINGS: Mild motion degraded examination. Bilateral mesial frontal lobe encephalomalacia with similar  mineralization. Ex vacuo dilatation of bifrontal horns, no hydrocephalus. Patchy areas of LEFT inferior frontotemporal encephalomalacia extending to the insula. No midline shift, mass effect or mass lesions. No intraparenchymal hemorrhage. No acute large vascular territory infarcts. No abnormal extra-axial fluid collections. Basal cisterns are patent. Status post probable A-comm aneurysm clipping. Ocular globes and orbital contents are unremarkable. 1 cm bony excrescence from inner table RIGHT petrous apex could represent meningioma or, hyperostosis. Remote LEFT frontotemporal craniotomy with all orbital extension old RIGHT frontal burr hole. No skull fracture. IMPRESSION: No acute intracranial process. 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 LEFT frontotemporal encephalomalacia may be postoperative or, MCA territory infarct. Status post remote LEFT craniotomy for aneurysm clipping. Electronically Signed   By: Awilda Metro M.D.   On: 08/29/2015 23:01   Ct Abdomen Pelvis W Contrast  08/29/2015  CLINICAL DATA:  Sepsis, possible gluteal abscess, sacral decubitus ulcer EXAM: CT ABDOMEN AND PELVIS WITH CONTRAST TECHNIQUE: Multidetector CT imaging of the abdomen and pelvis was performed using the standard protocol following bolus administration of intravenous contrast. CONTRAST:  100mL OMNIPAQUE IOHEXOL 300 MG/ML  SOLN COMPARISON:  None. FINDINGS: Lung bases are unremarkable. Sagittal images of the spine shows mild degenerative changes lower thoracic spine. Mild fatty infiltration of the liver. The patient is status postcholecystectomy. The pancreas, spleen and adrenal glands are unremarkable. No aortic aneurysm. Kidneys are symmetrical in size and enhancement. No hydronephrosis or hydroureter. There is a low-density lesion midpole of the left kidney measures 1.6 cm. Although this may represent a complex cyst a solid lesion cannot be excluded. Further correlation with ultrasound and  MRI is recommended. No small bowel obstruction. Abundant stool noted in right colon and transverse colon. No pericecal inflammation. Normal appendix. Moderate stool noted in distal sigmoid colon and rectum. There is some enhancement of mucosa in distal sigmoid colon. Mild thickening of distal sigmoid colon wall. Mild colitis cannot be excluded. Delayed images of the pelvis shows a Foley catheter in a decompressed urinary bladder. Moderate stool and liquid noted within rectum. Mild stranding of perianal fat and minimal thickening of anal wall probable due to proctitis. In axial image 9 there is skin defect and soft tissue wound just left to the coccyx region. This is consistent with the known decubitus ulcer. There is no evidence of subcutaneous or gluteal abscess. Minimal soft tissue edema surrounding the anus. Small amount of air within anterior aspect of the urinary bladder probable post instrumentation. Small bilateral inguinal region lymph nodes probable reactive. There is occlusive thrombus starting in lower IVC axial image 52 left common iliac vein, external iliac vein and left femoral vein. IMPRESSION: 1. Extensive occlusive thrombus starting lower IVC at the bifurcation extending in left common iliac vein, external iliac vein, left femoral vein. The right aspect of IVC bifurcation and right iliac veins are patent. 2. There is skin defect consistent with decubitus ulcer left posterior pelvis just lateral to coccygeal region without evidence of definite abscess. No gluteal abscess. 3. Indeterminate low-density lesion midpole of the left kidney. Further correlation with ultrasound and/or MRI is recommended. 4. Abundant stool noted in right colon and transverse colon. Normal appendix. 5. No small bowel obstruction. 6. No hydronephrosis or hydroureter. Mild thickening of distal sigmoid colon wall and mild enhancement of mucosa. Mild wall edema or colitis cannot be entirely excluded. 7. Mild thickening of anal  wall.  Mild proctitis cannot be excluded. 8. These results were called by telephone at the time of interpretation on 08/29/2015 at 7:30 pm to patient's ICU nurse Annice PihJackie who verbally acknowledged these results. Electronically Signed   By: Natasha MeadLiviu  Pop M.D.   On: 08/29/2015 19:31   Dg Chest Port 1 View  08/28/2015  CLINICAL DATA:  Hypotension with tachycardia and altered mental status with elevated white blood cell count. EXAM: PORTABLE CHEST 1 VIEW COMPARISON:  06/03/2015 FINDINGS: Lungs are adequately inflated without consolidation or effusion. Cardiomediastinal silhouette and remainder of the exam is unchanged. IMPRESSION: No active disease. Electronically Signed   By: Elberta Fortisaniel  Boyle M.D.   On: 08/28/2015 14:40    Labs:  CBC:  Recent Labs  06/06/15 0520 08/28/15 1415 08/29/15 0340 08/30/15 0350 08/30/15 1355  WBC 6.3 15.5* 14.2* 10.8*  --   HGB 12.8 10.0* 8.7* 8.3* 7.9*  HCT 38.1 31.7* 27.3* 26.6* 24.4*  PLT 94* 226 195  205  --     COAGS:  Recent Labs  08/30/15 0036  INR 1.47  APTT 29    BMP:  Recent Labs  06/06/15 0520 08/28/15 1415 08/29/15 0340 08/30/15 0350  NA 136 158* 157* 153*  K 3.6 4.3 4.0 3.4*  CL 108 >130* >130* 129*  CO2 22 19* 16* 18*  GLUCOSE 120* 104* 103* 90  BUN 5* 47* 32* 19  CALCIUM 8.4* 9.4 8.6* 8.5*  CREATININE 0.49 1.04* 0.71 0.65  GFRNONAA >60 >60 >60 >60  GFRAA >60 >60 >60 >60    LIVER FUNCTION TESTS:  Recent Labs  03/16/15 1134 06/01/15 1703 08/28/15 1415  BILITOT 0.6 0.7 0.6  AST ALT 34 41 11*  ALKPHOS 140* 133* 71  PROT 7.1 7.2 7.4  ALBUMIN 3.6 3.6 2.5*    Assessment and Plan: 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 Coli  History of ruptured cerebral aneurysm  Seizure disorder Nonverbal  Request for IVC filter placement Risks and Benefits discussed with the patient's family today including, but not limited to  bleeding, infection, contrast induced renal failure, filter fracture or migration, strut penetration with damage or irritation to adjacent structures and caval thrombosis. All questions were answered, patient's family is agreeable to proceed. Consent signed and in chart.   Thank you for this interesting consult.  I greatly enjoyed meeting Rondia Higginbotham and look forward to participating in their care.  A copy of this report was sent to the requesting provider on this date.  SignedBerneta Levins 08/30/2015, 4:27 PM   I spent a total of 20 Minutes in face to face in clinical consultation, greater than 50% of which was counseling/coordinating care for IVC thrombus

## 2015-08-30 NOTE — Progress Notes (Signed)
Physical Therapy Hydrotherapy Evaluation Patient Details  Name: [name] MRN: [id number] Date of Birth: 1966/07/08  Today's Date: 08/30/2015 Time: 1200-1240 Time Calculation (min): 40 min  Subjective  Subjective: Pt is a 49 year old female admitted for sepsis secondary to sacral abscess/stage IV decubitus ulcer of sacral region Patient and Family Stated Goals: pt nonverbal Prior Treatments: bedside surgical debridement by MD  Pain Score:  nonverbal, premedicated with IV meds by RN  Wound Assessment                                                                                                                      Wound / Incision (Open or Dehisced) 08/30/15 Other (Comment) Sacrum Mid Stage IV Pressure Sacral Pressure Ulcer (Active)  Dressing Type Moist to dry;Silicone dressing 13/24/4010  1:00 PM  Dressing Changed Changed 08/30/2015  1:00 PM  Dressing Status Old drainage 08/30/2015  1:00 PM  Dressing Change Frequency Twice a day 08/30/2015  1:00 PM  Site / Wound Assessment Brown 08/30/2015  1:00 PM  % Wound base Red or Granulating 5% 08/30/2015  1:00 PM  % Wound base Other (Comment) 95% 08/30/2015  1:00 PM  Peri-wound Assessment Intact 08/30/2015  1:00 PM  Wound Length (cm) 10 cm 08/30/2015  1:00 PM  Wound Width (cm) 10 cm 08/30/2015  1:00 PM  Tunneling (cm) 2.5 08/30/2015  1:00 PM  Margins Unattached edges (unapproximated) 08/30/2015  1:00 PM  Drainage Amount Copious 08/30/2015  1:00 PM  Drainage Description Purulent 08/30/2015  1:00 PM  Treatment Debridement (Selective);Hydrotherapy (Pulse lavage);Packing (Saline gauze) 08/30/2015  1:00 PM   Hydrotherapy Pulsed lavage therapy - wound location: sacral pressure ulcer Pulsed Lavage with Suction (psi): 8 psi Pulsed Lavage with Suction - Normal Saline Used: 1000 mL Pulsed Lavage Tip: Tip with splash shield Selective Debridement Selective Debridement - Location: eschar/slough of sacral  pressure ulcer Selective Debridement - Tools Used: Forceps;Scissors Selective Debridement - Tissue Removed: eschar, slough   Wound Assessment and Plan  Wound Therapy - Assess/Plan/Recommendations Wound Therapy - Clinical Statement: Pt would benefit from hydrotheray to promote wound healing and remove necrotic tissue to reduce bioburden. Wound Therapy - Functional Problem List: medically complex pt, limited mobility Factors Delaying/Impairing Wound Healing: Immobility;Multiple medical problems;Incontinence Hydrotherapy Plan: Debridement;Dressing change;Patient/family education;Pulsatile lavage with suction Wound Therapy - Frequency: 6X / week Wound Therapy - Follow Up Recommendations: Skilled nursing facility Wound Plan: Perform pulsatile lavage, selective debridement and dressing changes to assist with wound healing and reducing bioburden.  Wound Therapy Goals- Improve the function of patient's integumentary system by progressing the wound(s) through the phases of wound healing (inflammation - proliferation - remodeling) by: Decrease Necrotic Tissue to: 50% Decrease Necrotic Tissue - Progress: Goal set today Increase Granulation Tissue to: 50% Increase Granulation Tissue - Progress: Goal set today Improve Drainage Characteristics: Mod Improve Drainage Characteristics - Progress: Goal set today Goals/treatment plan/discharge plan were made with and agreed upon by patient/family: No, Patient unable to participate in goals/treatment/discharge plan and family unavailable  Time For Goal Achievement: 2 weeks Wound Therapy - Potential for Goals: Fair  Goals will be updated until maximal potential achieved or discharge criteria met.  Discharge criteria: when goals achieved, discharge from hospital, MD decision/surgical intervention, no progress towards goals, refusal/missing three consecutive treatments without notification or medical reason.  GP     [name],[name] 08/30/2015, 1:29 PM [name], PT, DPT 08/30/2015 Pager: [phone]

## 2015-08-30 NOTE — Progress Notes (Signed)
ANTICOAGULATION CONSULT NOTE - Initial Consult  Pharmacy Consult for IV Heparin Indication: Large thrombus ileac vein  Allergies  Allergen Reactions  . Amlodipine Swelling  . Penicillins Swelling  . Latex Rash    Unknown   . Other Other (See Comments)    Natural Rubber- Unknown     Patient Measurements: Height:  (170.2 cm) Weight: 176 lb 2.4 oz (79.9 kg) IBW/kg (Calculated) : 61.6 Heparin Dosing Weight: 66 kg  Vital Signs: Temp: 98.9 F (37.2 C) (10/25 0000) Temp Source: Oral (10/25 0000) BP: 112/84 mmHg (10/24 2300) Pulse Rate: 98 (10/24 2300)  Labs:  Recent Labs  08/28/15 1415 08/28/15 2020 08/29/15 0340  HGB 10.0*  --  8.7*  HCT 31.7*  --  27.3*  PLT 226  --  195  CREATININE 1.04*  --  0.71  TROPONINI  --  0.05*  --     Estimated Creatinine Clearance: 93.5 mL/min (by C-G formula based on Cr of 0.71).   Medical History: Past Medical History  Diagnosis Date  . Hypertension   . SAH (subarachnoid hemorrhage) (HCC)   . Seizure (HCC)   . Stroke (HCC)   . Tracheostomy status (HCC)   . Tracheomalacia   . DVT (deep venous thrombosis) (HCC)     Medications:  Prescriptions prior to admission  Medication Sig Dispense Refill Last Dose  . acetaminophen (TYLENOL) 325 MG tablet Take 2 tablets (650 mg total) by mouth daily. (Patient taking differently: Take 650 mg by mouth daily as needed for moderate pain or fever. 2 tablets given every day, may take additional tablets PRN for fever above 100 F)   08/28/2015 at 1714  . acetaminophen (TYLENOL) 650 MG suppository Place 650 mg rectally every 4 (four) hours as needed for fever. For temp. 100 F or above   unknown  . Amino Acids-Protein Hydrolys (FEEDING SUPPLEMENT, PRO-STAT SUGAR FREE 64,) LIQD Take 30 mLs by mouth daily.   08/28/2015 at Unknown time  . Ascorbic Acid (VITAMIN C) 1000 MG tablet Take 1,000 mg by mouth daily.   08/28/2015 at Unknown time  . carboxymethylcellulose (REFRESH PLUS) 0.5 % SOLN Place 2 drops  into both eyes 3 (three) times daily.   08/28/2015 at 0800  . Cholecalciferol (VITAMIN D3) 5000 UNITS TABS Take 1 tablet by mouth daily with breakfast.   08/28/2015 at Unknown time  . cholestyramine light (PREVALITE) 4 G packet Take 4 g by mouth 3 (three) times daily. mix in 8 oz. Of Liquid   08/28/2015 at 0900  . cloNIDine (CATAPRES - DOSED IN MG/24 HR) 0.3 mg/24hr patch Place 1 patch (0.3 mg total) onto the skin once a week. 4 patch 0 08/23/2015  . famotidine (PEPCID) 40 MG/5ML suspension Take 2.5 mLs (20 mg total) by mouth every 12 (twelve) hours. 50 mL 0 08/28/2015 at Unknown time  . feeding supplement, ENSURE ENLIVE, (ENSURE ENLIVE) LIQD Take 237 mLs by mouth 2 (two) times daily between meals. (Patient taking differently: Take 237 mLs by mouth 3 (three) times daily between meals. ) 60 Bottle 0 08/28/2015 at 0900  . lacosamide (VIMPAT) 50 MG TABS tablet Take 2 tablets (100 mg total) by mouth 2 (two) times daily. 60 tablet 0 08/28/2015 at Unknown time  . levETIRAcetam (KEPPRA) 750 MG tablet Take 1,500 mg by mouth 2 (two) times daily.   08/28/2015 at Unknown time  . lisinopril (PRINIVIL,ZESTRIL) 10 MG tablet Take 10 mg by mouth daily.   08/28/2015 at Unknown time  . loperamide (IMODIUM A-D)  2 MG tablet Take 4 mg by mouth 4 (four) times daily as needed for diarrhea or loose stools.   08/22/2015 at Unknown time  . metoprolol (LOPRESSOR) 100 MG tablet Take 1 tablet (100 mg total) by mouth 2 (two) times daily.   08/28/2015 at 0800  . mirtazapine (REMERON) 7.5 MG tablet Take 7.5 mg by mouth at bedtime.   08/27/2015 at Unknown time  . Multiple Vitamin (MULTIVITAMIN WITH MINERALS) TABS tablet Take 1 tablet by mouth daily with breakfast.   08/28/2015 at Unknown time  . traZODone (DESYREL) 50 MG tablet Take 25 mg by mouth at bedtime as needed for sleep.    unknown  . minoxidil (LONITEN) 10 MG tablet Take 1 tablet (10 mg total) by mouth 2 (two) times daily. (Patient not taking: Reported on 08/28/2015) 60  tablet 0    Scheduled:  . cefTAZidime (FORTAZ)  IV  1 g Intravenous 3 times per day  . heparin  2,000 Units Intravenous Once  . lacosamide (VIMPAT) IV  100 mg Intravenous Q12H  . levETIRAcetam  1,500 mg Intravenous Q12H  . metoprolol  100 mg Oral BID  .  morphine injection  1 mg Intravenous Q4H  . sodium chloride  3 mL Intravenous Q12H  . vancomycin  1,000 mg Intravenous Q12H   Infusions:  . sodium chloride 100 mL/hr at 08/29/15 1202  . heparin      Assessment: [age] yoF admitted with AMS, CT reveals large thrombus of ileac vein.  IV Heparin per Rx . Goal of Therapy:  Heparin level 0.3-0.7 units/ml Monitor platelets by anticoagulation protocol: Yes   Plan:   Baseline coags stat  Heparin 2000 unit bolus x1  Start drip @ 1100 units/hr  Daily CBC/HL  Check 1st HL in 6 hours  Susanne GreenhouseGreen, Jashayla Glatfelter R 08/30/2015,12:35 AM

## 2015-08-30 NOTE — Progress Notes (Addendum)
Date: August 30, 2015 Chart reviewed for concurrent status and case management needs. Will continue to follow patient for changes and needs: bld cultures show gram negative rods. Marcelle Smilinghonda Taylyn Brame, RN, BSN, [place]   [phone]

## 2015-08-31 DIAGNOSIS — R569 Unspecified convulsions: Secondary | ICD-10-CM

## 2015-08-31 DIAGNOSIS — R1310 Dysphagia, unspecified: Secondary | ICD-10-CM

## 2015-08-31 LAB — VITAMIN B12: Vitamin B-12: 1448 pg/mL — ABNORMAL HIGH (ref 180–914)

## 2015-08-31 LAB — CBC
HEMATOCRIT: 25 % — AB (ref 36.0–46.0)
HEMOGLOBIN: 8 g/dL — AB (ref 12.0–15.0)
MCH: 28.1 pg (ref 26.0–34.0)
MCHC: 32 g/dL (ref 30.0–36.0)
MCV: 87.7 fL (ref 78.0–100.0)
Platelets: 198 10*3/uL (ref 150–400)
RBC: 2.85 MIL/uL — ABNORMAL LOW (ref 3.87–5.11)
RDW: 16.1 % — ABNORMAL HIGH (ref 11.5–15.5)
WBC: 7.5 10*3/uL (ref 4.0–10.5)

## 2015-08-31 LAB — BASIC METABOLIC PANEL
ANION GAP: 6 (ref 5–15)
BUN: 16 mg/dL (ref 6–20)
CHLORIDE: 127 mmol/L — AB (ref 101–111)
CO2: 18 mmol/L — AB (ref 22–32)
CREATININE: 0.63 mg/dL (ref 0.44–1.00)
Calcium: 8.6 mg/dL — ABNORMAL LOW (ref 8.9–10.3)
GFR calc non Af Amer: >60 mL/min (ref >60)
GLUCOSE: 100 mg/dL — AB (ref 65–99)
Potassium: 3.4 mmol/L — ABNORMAL LOW (ref 3.5–5.1)
Sodium: 151 mmol/L — ABNORMAL HIGH (ref 135–145)

## 2015-08-31 LAB — URINE CULTURE

## 2015-08-31 LAB — FOLATE: FOLATE: 27 ng/mL (ref >5.9)

## 2015-08-31 LAB — IRON AND TIBC: IRON: 27 ug/dL — AB (ref 28–170)

## 2015-08-31 LAB — VANCOMYCIN, TROUGH: VANCOMYCIN TR: 35 ug/mL — AB (ref 10.0–20.0)

## 2015-08-31 LAB — FERRITIN: Ferritin: 565 ng/mL — ABNORMAL HIGH (ref 11–307)

## 2015-08-31 MED ORDER — LEVETIRACETAM 100 MG/ML PO SOLN
1500.0000 mg | Freq: Two times a day (BID) | ORAL | Status: DC
  Administered 2015-08-31 – 2015-09-06 (×12): 1500 mg via ORAL
  Filled 2015-08-31 (×17): qty 15

## 2015-08-31 MED ORDER — COLLAGENASE 250 UNIT/GM EX OINT
TOPICAL_OINTMENT | Freq: Two times a day (BID) | CUTANEOUS | Status: DC
  Administered 2015-08-31 – 2015-09-06 (×10): via TOPICAL
  Filled 2015-08-31 (×2): qty 30

## 2015-08-31 MED ORDER — LEVETIRACETAM 500 MG PO TABS
1500.0000 mg | ORAL_TABLET | Freq: Two times a day (BID) | ORAL | Status: DC
  Filled 2015-08-31: qty 3

## 2015-08-31 NOTE — NC FL2 (Cosign Needed)
[place] MEDICAID FL2 LEVEL OF CARE SCREENING TOOL     IDENTIFICATION  Patient Name: [name] Birthdate: 05/09/1966 Sex: female Admission Date (Current Location): 08/28/2015  County and Medicaid Number:     Facility and Address:  [facility],  [address], [place] [address]      Provider Number: [id number]  Attending Physician Name and Address:  Vijaya Akula, MD  Relative Name and Phone Number:       Current Level of Care: Hospital Recommended Level of Care: Skilled Nursing Facility Prior Approval Number:    Date Approved/Denied:   PASRR Number: [id number]  Discharge Plan: SNF    Current Diagnoses: Patient Active Problem List   Diagnosis Date Noted  . DNR (do not resuscitate) discussion   . Adult failure to thrive   . Palliative care encounter 08/29/2015  . Sepsis (HCC) 08/28/2015  . UTI (lower urinary tract infection) 08/28/2015  . Decubitus ulcer of sacral region, stage 4 (HCC) 08/28/2015  . Abscess, sacrum (HCC) 08/28/2015  . Pressure ulcer 06/02/2015  . SAH (subarachnoid hemorrhage) (HCC) 06/01/2015  . Acute encephalopathy 06/01/2015  . Localization-related symptomatic epilepsy and epileptic syndromes with complex partial seizures, not intractable, without status epilepticus (HCC) 05/06/2015  . Hx of ischemic right ACA stroke 05/06/2015  . History of ischemic left ACA stroke 05/06/2015  . C. difficile diarrhea 03/20/2015  . Difficult intravenous access   . Dysphagia   . Hypernatremia 03/16/2015  . Dehydration 03/16/2015  . Seizures (HCC) 07/05/2014  . Stroke (HCC) 07/05/2014  . Chronic respiratory failure (HCC) 04/07/2014  . Tracheitis 03/31/2014  . Pneumonia 03/31/2014  . HCAP (healthcare-associated pneumonia) 03/07/2014  . DVT (deep venous thrombosis) (HCC) 03/07/2014  . BP (high blood pressure) 09/26/2013  . Aneurysm (HCC) 09/25/2013  . Brain aneurysm 08/17/2013  . Occipital neuralgia 08/17/2013  . Sinus headache  08/17/2013  . Headache(784.0) 06/02/2013  . Palpitations 03/04/2013  . Edema 03/04/2013  . Anxiety state, unspecified 03/04/2013  . Tobacco abuse 02/27/2013  . Chest discomfort 02/10/2013  . Lower abdominal pain 05/01/2012  . Allergic rhinitis 05/01/2012  . HTN (hypertension) 02/25/2012  . Obesity 02/25/2012    Orientation ACTIVITIES/SOCIAL BLADDER RESPIRATION  Self Passive Indwelling catheter Normal  BEHAVIORAL SYMPTOMS/MOOD NEUROLOGICAL BOWEL NUTRITION STATUS   (no behaviors)   Incontinent Diet (DSY 1)  PHYSICIAN VISITS COMMUNICATION OF NEEDS Height & Weight Skin    Does not communicate 5' 7" (170.2 cm) 176 lbs. PU Stage and Appropriate Care          AMBULATORY STATUS RESPIRATION     (non ambulatorty) Normal      Personal Care Assistance Level of Assistance  Total care Bathing Assistance: Maximum assistance     Total Care Assistance: Maximum assistance    Functional Limitations Info  Speech (non verbal)     Speech Info: Impaired (non verbal)       SPECIAL CARE FACTORS FREQUENCY                      Additional Factors Info  Code Status Code Status Info: Full Code             Current Medications (08/31/2015): Current Facility-Administered Medications  Medication Dose Route Frequency Provider Last Rate Last Dose  . 0.45 % sodium chloride infusion   Intravenous Continuous Sendil K Krishnan, MD 100 mL/hr at 08/31/15 0531    . 0.9 %  sodium chloride infusion  500 mL Intravenous Q30 min PRN Sendil K   Rito EhrlichKrishnan, MD      . acetaminophen (TYLENOL) tablet 650 mg  650 mg Oral Q6H PRN Hollice EspySendil K Krishnan, MD       Or  . acetaminophen (TYLENOL) suppository 650 mg  650 mg Rectal Q6H PRN Hollice EspySendil K Krishnan, MD      . cefTAZidime (FORTAZ) 1 g in dextrose 5 % 50 mL IVPB  1 g Intravenous 3 times per day Otho Bellowserri L Green, RPH   1 g at 08/31/15 0531  . collagenase (SANTYL) ointment   Topical BID Chevis PrettyPaul Toth III, MD      . iohexol (OMNIPAQUE) 300 MG/ML solution 34 mL  34 mL  Intravenous Once PRN Medication Radiologist, MD   34 mL at 08/30/15 1805  . lacosamide (VIMPAT) 100 mg in sodium chloride 0.9 % 25 mL IVPB  100 mg Intravenous Q12H Hollice EspySendil K Krishnan, MD   100 mg at 08/31/15 1058  . levETIRAcetam (KEPPRA) tablet 1,500 mg  1,500 mg Oral BID Danford Badrew A Wofford, RPH      . metoprolol tartrate (LOPRESSOR) tablet 100 mg  100 mg Oral BID Hollice EspySendil K Krishnan, MD   100 mg at 08/31/15 1058  . morphine 2 MG/ML injection 1 mg  1 mg Intravenous Q4H Hollice EspySendil K Krishnan, MD   1 mg at 08/31/15 1042  . morphine 2 MG/ML injection 2 mg  2 mg Intravenous Q3H PRN Hollice EspySendil K Krishnan, MD   2 mg at 08/30/15 1707  . ondansetron (ZOFRAN) tablet 4 mg  4 mg Oral Q6H PRN Hollice EspySendil K Krishnan, MD       Or  . ondansetron [facility](ZOFRAN) injection 4 mg  4 mg Intravenous Q6H PRN Hollice EspySendil K Krishnan, MD      . sodium chloride 0.9 % injection 3 mL  3 mL Intravenous Q12H Hollice EspySendil K Krishnan, MD   3 mL at 08/31/15 1058  . vancomycin (VANCOCIN) IVPB 1000 mg/200 mL premix  1,000 mg Intravenous Q12H Otho Bellowserri L Green, RPH   1,000 mg at 08/31/15 0900   Do not use this list as official medication orders. Please verify with discharge summary.  Discharge Medications:   Medication List    ASK your doctor about these medications        acetaminophen 650 MG suppository  Commonly known as:  TYLENOL  Place 650 mg rectally every 4 (four) hours as needed for fever. For temp. 100 F or above     acetaminophen 325 MG tablet  Commonly known as:  TYLENOL  Take 2 tablets (650 mg total) by mouth daily.     carboxymethylcellulose 0.5 % Soln  Commonly known as:  REFRESH PLUS  Place 2 drops into both eyes 3 (three) times daily.     cholestyramine light 4 G packet  Commonly known as:  PREVALITE  Take 4 g by mouth 3 (three) times daily. mix in 8 oz. Of Liquid     cloNIDine 0.3 mg/24hr patch  Commonly known as:  CATAPRES - Dosed in mg/24 hr  Place 1 patch (0.3 mg total) onto the skin once a week.     famotidine 40 MG/5ML suspension   Commonly known as:  PEPCID  Take 2.5 mLs (20 mg total) by mouth every 12 (twelve) hours.     feeding supplement (ENSURE ENLIVE) Liqd  Take 237 mLs by mouth 2 (two) times daily between meals.     feeding supplement (PRO-STAT SUGAR FREE 64) Liqd  Take 30 mLs by mouth daily.     lacosamide 50 MG Tabs  tablet  Commonly known as:  VIMPAT  Take 2 tablets (100 mg total) by mouth 2 (two) times daily.     levETIRAcetam 750 MG tablet  Commonly known as:  KEPPRA  Take 1,500 mg by mouth 2 (two) times daily.     lisinopril 10 MG tablet  Commonly known as:  PRINIVIL,ZESTRIL  Take 10 mg by mouth daily.     loperamide 2 MG tablet  Commonly known as:  IMODIUM A-D  Take 4 mg by mouth 4 (four) times daily as needed for diarrhea or loose stools.     metoprolol 100 MG tablet  Commonly known as:  LOPRESSOR  Take 1 tablet (100 mg total) by mouth 2 (two) times daily.     minoxidil 10 MG tablet  Commonly known as:  LONITEN  Take 1 tablet (10 mg total) by mouth 2 (two) times daily.     mirtazapine 7.5 MG tablet  Commonly known as:  REMERON  Take 7.5 mg by mouth at bedtime.     multivitamin with minerals Tabs tablet  Take 1 tablet by mouth daily with breakfast.     traZODone 50 MG tablet  Commonly known as:  DESYREL  Take 25 mg by mouth at bedtime as needed for sleep.     vitamin C 1000 MG tablet  Take 1,000 mg by mouth daily.     Vitamin D3 5000 UNITS Tabs  Take 1 tablet by mouth daily with breakfast.        Relevant Imaging Results:  Relevant Lab Results:  Recent Labs    Additional Information  [facility] # [id number])  Roza Creamer, Dickey Gave, LCSW

## 2015-08-31 NOTE — Progress Notes (Addendum)
ANTIBIOTIC CONSULT NOTE - FOLLOW UP  Pharmacy Consult for Vancomycin, Ceftazidime Indication: rule out sepsis  Allergies  Allergen Reactions  . Amlodipine Swelling  . Penicillins Swelling  . Latex Rash    Unknown   . Other Other (See Comments)    Natural Rubber- Unknown     Patient Measurements: Height: 5\' 7"  (170.2 cm) Weight: 176 lb 2.4 oz (79.9 kg) IBW/kg (Calculated) : 61.6  Vital Signs: Temp: 100.4 F (38 C) (10/26 0700) Temp Source: Core (Comment) (10/26 0400) BP: 104/76 mmHg (10/26 0700) Pulse Rate: 107 (10/26 0700) Intake/Output from previous day: 10/25 0701 - 10/26 0700 In: 4071 [I.V.:3236; IV Piggyback:835] Out: 865 [Urine:865] Intake/Output from this shift:    Labs:  Recent Labs  08/29/15 0340 08/30/15 0350 08/30/15 1355 08/31/15 0345  WBC 14.2* 10.8*  --  7.5  HGB 8.7* 8.3* 7.9* 8.0*  PLT 195 205  --  198  CREATININE 0.71 0.65  --  0.63   Estimated Creatinine Clearance: 93.5 mL/min (by C-G formula based on Cr of 0.63). No results for input(s): VANCOTROUGH, VANCOPEAK, VANCORANDOM, GENTTROUGH, GENTPEAK, GENTRANDOM, TOBRATROUGH, TOBRAPEAK, TOBRARND, AMIKACINPEAK, AMIKACINTROU, AMIKACIN in the last 72 hours.   Microbiology: Recent Results (from the past 720 hour(s))  Blood Culture (routine x 2)     Status: None (Preliminary result)   Collection Time: 08/28/15  2:15 PM  Result Value Ref Range Status   Specimen Description RIGHT ANTECUBITAL  Final   Special Requests BOTTLES DRAWN AEROBIC AND ANAEROBIC 5CC  Final   Culture  Setup Time   Final    GRAM NEGATIVE RODS AEROBIC BOTTLE ONLY CRITICAL RESULT CALLED TO, READ BACK BY AND VERIFIED WITH: J. THIGPEN,RN AT 1123 ON [id number] BY Lucienne CapersS. YARBROUGH    Culture   Final    PROTEUS MIRABILIS SUSCEPTIBILITIES TO FOLLOW Performed at [facility]    Report Status PENDING  Incomplete  Blood Culture (routine x 2)     Status: None (Preliminary result)   Collection Time: 08/28/15  2:20 PM  Result Value Ref  Range Status   Specimen Description LEFT ANTECUBITAL  Final   Special Requests BOTTLES DRAWN AEROBIC AND ANAEROBIC 5CC  Final   Culture   Final    NO GROWTH 2 DAYS Performed at [facility]    Report Status PENDING  Incomplete  Urine culture     Status: None (Preliminary result)   Collection Time: 08/28/15  2:29 PM  Result Value Ref Range Status   Specimen Description URINE, CATHETERIZED  Final   Special Requests NONE  Final   Culture   Final    >=100,000 COLONIES/mL ESCHERICHIA COLI Performed at [facility] [place]    Report Status PENDING  Incomplete  MRSA PCR Screening     Status: None   Collection Time: 08/28/15  6:00 PM  Result Value Ref Range Status   MRSA by PCR NEGATIVE NEGATIVE Final    Comment:        The GeneXpert MRSA Assay (FDA approved for NASAL specimens only), is one component of a comprehensive MRSA colonization surveillance program. It is not intended to diagnose MRSA infection nor to guide or monitor treatment for MRSA infections.     Anti-infectives    Start     Dose/Rate Route Frequency Ordered Stop   08/28/15 2200  cefTAZidime (FORTAZ) 1 g in dextrose 5 % 50 mL IVPB     1 g 100 mL/hr over 30 Minutes Intravenous 3 times per day 08/28/15 1439  08/28/15 2000  vancomycin (VANCOCIN) IVPB 1000 mg/200 mL premix     1,000 mg 200 mL/hr over 60 Minutes Intravenous Every 12 hours 08/28/15 1441     08/28/15 1430  cefTAZidime (FORTAZ) 2 g in dextrose 5 % 50 mL IVPB     2 g 100 mL/hr over 30 Minutes Intravenous  Once 08/28/15 1415 08/28/15 1456   08/28/15 1415  vancomycin (VANCOCIN) IVPB 1000 mg/200 mL premix     1,000 mg 200 mL/hr over 60 Minutes Intravenous  Once 08/28/15 1408 08/28/15 1547      Assessment: 49 yoF to ED from SNF for AMS, leukocytosis, tachycardia. Hx of CVA with L hemiparesis. PCN allergy noted with rx of " swelling", has tolerated Cefepime previously. Begin Vancomycin and Ceftazidime, pharmacy to dose.    10/23 >> Vanc  >> 10/23 >> Ceftaz  >>    10/23 blood: 1/2 Proteus 10/23 urine: > 100k E coli 10/25 GI path panel: IP No prior positive cultures in Epic   Today, 08/31/2015: Temp: persistent low-grade temps in 100s WBC: improved to wnl Renal: mild AKI resolved CrCl 93 CG (likely overestimate d/t paralysis) 10/24 CT pelvis shows no residual abscess (no mention of osteo presence or absence)   Goal of Therapy:  Vancomycin trough level 15-20 mcg/ml  Eradication of infection Appropriate antibiotic dosing for indication and renal function  Plan:  Day 4 antibiotics  Continue vancomycin and ceftazidime as ordered  Consider stopping vancomycin based on culture results.  Pseudomonal coverage not necessary; Ancef or Rocephin should provide adequate coverage for P mirabilis and E coli (excluding ESBL).  Will check VT tonight before 8th dose.  Follow clinical course, renal function, culture results as available  Follow for de-escalation of antibiotics and LOT   Bernadene Person, PharmD, BCPS Pager: [phone] 08/31/2015, 9:18 AM

## 2015-08-31 NOTE — Clinical Social Work Note (Signed)
Clinical Social Work Assessment  Patient Details  Name: [name] MRN: [id number] Date of Birth: 1965-12-24  Date of referral:  08/31/15               Reason for consult:  Facility Placement, Discharge Planning                Permission sought to share information with:  Facility Art therapist granted to share information::  Yes, Verbal Permission Granted  Name::        Agency::     Relationship::     Contact Information:     Housing/Transportation Living arrangements for the past 2 months:  [place] of Information:  Adult Children Patient Interpreter Needed:  None Criminal Activity/Legal Involvement Pertinent to Current Situation/Hospitalization:  No - Comment as needed Significant Relationships:  Adult Children Lives with:  Facility Resident Do you feel safe going back to the place where you live?   (Family requesting new placement.) Need for family participation in patient care:  Yes (Comment)  Care giving concerns:  Family is unhappy with care received at [facility] care.   Social Worker assessment / plan:  Pt hospitalized on 08/28/15 with multiple medical issues including sepsis and Stage 4 decubitus ulcer. Pt is a LTC resident from [facility]. She is non verbal. CSW spoke with her son, Pollyann Glen, this afternoon to assist with d/c planning. Torrie requested new SNF placement for pt. New SNF search has been initiated and bed offers pending. Torrie is aware that if no other SNF bed offers are available pt will need to return to [facility] and family will be encouraged to continue SNF search. [facility] has agreed to readmit pt at d/c.  Employment status:  Disabled (Comment on whether or not currently receiving Disability) Insurance information:  Programmer, applications, Medicaid In [place] PT Recommendations:  No Follow Up Information / Referral to community resources:  [place]  Patient/Family's Response to care:   Family would like to continue with LTC placement. Palliative Care Team has met with family to offer support and GOC. Family would like pt to remain Full Code.  Patient/Family's Understanding of and Emotional Response to Diagnosis, Current Treatment, and Prognosis:  Family is aware of pt's medical status. They are not ready to consider less aggressive care. Family is hopeful that a new SNF will be available at time of d/c.  Emotional Assessment Appearance:  Appears stated age Attitude/Demeanor/Rapport:  Other (cooperative) Affect (typically observed):  Calm Orientation:  Oriented to Self Alcohol / Substance use:  Not Applicable Psych involvement (Current and /or in the community):  No (Comment)  Discharge Needs  Concerns to be addressed:  Discharge Planning Concerns Readmission within the last 30 days:  No Current discharge risk:  None Barriers to Discharge:  No Barriers Identified   Loraine Maple  [phone] 08/31/2015, 12:33 PM

## 2015-08-31 NOTE — Progress Notes (Signed)
Pharmacy IV to PO conversion  The patient is receiving LEVETIRACETAM by the intravenous route.  Based on criteria approved by the Pharmacy and Therapeutics Committee and the Medical Executive Committee, the medication is being converted to the equivalent oral dose form.   No active GI bleeding or impaired absorption  Not s/p esophagectomy  Documented ability to take oral medications for > 24 hr  Plan to continue treatment for at least 1 day  No prior seizures this admission  If you have any questions about this conversion, please contact the Pharmacy Department (ext [phone]).  Thank you.  Bernadene Personrew Rishard Delange, PharmD Pager: [phone] 08/31/2015, 9:26 AM

## 2015-08-31 NOTE — Progress Notes (Signed)
[facility].

## 2015-08-31 NOTE — Clinical Social Work Placement (Signed)
   CLINICAL SOCIAL WORK PLACEMENT  NOTE  Date:  08/31/2015  Patient Details  Name: Donald PoreLaura Marie Tygart MRN: [id number] Date of Birth: 04/17/1966  Clinical Social Work is seeking post-discharge placement for this patient at the Skilled  Nursing Facility level of care (*CSW will initial, date and re-position this form in  chart as items are completed):  No   Patient/family provided with [facility] Clinical Social Work Department's list of facilities offering this level of care within the geographic area requested by the patient (or if unable, by the patient's family).  Yes   Patient/family informed of their freedom to choose among providers that offer the needed level of care, that participate in Medicare, Medicaid or managed care program needed by the patient, have an available bed and are willing to accept the patient.  Yes   Patient/family informed of [place]'s ownership interest in [facility] and [facility], as well as of the fact that they are under no obligation to receive care at these facilities.  PASRR submitted to EDS on       PASRR number received on       Existing PASRR number confirmed on 08/31/15     FL2 transmitted to all facilities in geographic area requested by pt/family on 08/31/15     FL2 transmitted to all facilities within larger geographic area on       Patient informed that his/her managed care company has contracts with or will negotiate with certain facilities, including the following:            Patient/family informed of bed offers received.  Patient chooses bed at       Physician recommends and patient chooses bed at      Patient to be transferred to   on  .  Patient to be transferred to facility by       Patient family notified on   of transfer.  Name of family member notified:        PHYSICIAN       Additional Comment:    _______________________________________________ Royetta AsalHaidinger, Arnol Mcgibbon Lee, LCSW   [phone]  08/31/2015, 1:08  PM

## 2015-08-31 NOTE — [place] FL2 (Deleted)
[place] MEDICAID FL2 LEVEL OF CARE SCREENING TOOL     IDENTIFICATION  Patient Name: [name] Birthdate: 10/05/1966 Sex: female Admission Date (Current Location): 08/28/2015  [facility]/[place] Number:     Facility and Address:  [facility],  [address] [place] [address]      Provider Number: [id number]  Attending Physician Name and Address:  Kathlen Mody, MD  Relative Name and Phone Number:       Current Level of Care: Hospital Recommended Level of Care: Skilled Nursing Facility Prior Approval Number:    Date Approved/Denied:   PASRR Number: [id number]  Discharge Plan: SNF    Current Diagnoses: Patient Active Problem List   Diagnosis Date Noted  . DNR (do not resuscitate) discussion   . Adult failure to thrive   . Palliative care encounter 08/29/2015  . Sepsis (HCC) 08/28/2015  . UTI (lower urinary tract infection) 08/28/2015  . Decubitus ulcer of sacral region, stage 4 (HCC) 08/28/2015  . Abscess, sacrum (HCC) 08/28/2015  . Pressure ulcer 06/02/2015  . SAH (subarachnoid hemorrhage) (HCC) 06/01/2015  . Acute encephalopathy 06/01/2015  . Localization-related symptomatic epilepsy and epileptic syndromes with complex partial seizures, not intractable, without status epilepticus (HCC) 05/06/2015  . Hx of ischemic right ACA stroke 05/06/2015  . History of ischemic left ACA stroke 05/06/2015  . C. difficile diarrhea 03/20/2015  . Difficult intravenous access   . Dysphagia   . Hypernatremia 03/16/2015  . Dehydration 03/16/2015  . Seizures (HCC) 07/05/2014  . Stroke (HCC) 07/05/2014  . Chronic respiratory failure (HCC) 04/07/2014  . Tracheitis 03/31/2014  . Pneumonia 03/31/2014  . HCAP (healthcare-associated pneumonia) 03/07/2014  . DVT (deep venous thrombosis) (HCC) 03/07/2014  . BP (high blood pressure) 09/26/2013  . Aneurysm (HCC) 09/25/2013  . Brain aneurysm 08/17/2013  . Occipital neuralgia 08/17/2013  . Sinus headache  08/17/2013  . Headache(784.0) 06/02/2013  . Palpitations 03/04/2013  . Edema 03/04/2013  . Anxiety state, unspecified 03/04/2013  . Tobacco abuse 02/27/2013  . Chest discomfort 02/10/2013  . Lower abdominal pain 05/01/2012  . Allergic rhinitis 05/01/2012  . HTN (hypertension) 02/25/2012  . Obesity 02/25/2012    Orientation ACTIVITIES/SOCIAL BLADDER RESPIRATION  Self Passive Indwelling catheter Normal  BEHAVIORAL SYMPTOMS/MOOD NEUROLOGICAL BOWEL NUTRITION STATUS   (no behaviors)   Incontinent Diet (DSY 1)  PHYSICIAN VISITS COMMUNICATION OF NEEDS Height & Weight Skin    Does not communicate  (170.2 cm) 176 lbs. PU Stage and Appropriate Care          AMBULATORY STATUS RESPIRATION     (non ambulatorty) Normal      Personal Care Assistance Level of Assistance  Total care Bathing Assistance: Maximum assistance     Total Care Assistance: Maximum assistance    Functional Limitations Info  Speech (non verbal)     Speech Info: Impaired (non verbal)       SPECIAL CARE FACTORS FREQUENCY                      Additional Factors Info  Code Status Code Status Info: Full Code             Current Medications (08/31/2015): Current Facility-Administered Medications  Medication Dose Route Frequency Provider Last Rate Last Dose  . 0.45 % sodium chloride infusion   Intravenous Continuous Hollice Espy, MD 100 mL/hr at 08/31/15 0531    . 0.9 %  sodium chloride infusion  500 mL Intravenous Q30 min PRN Sendil K  Rito EhrlichKrishnan, MD      . acetaminophen (TYLENOL) tablet 650 mg  650 mg Oral Q6H PRN Hollice EspySendil K Krishnan, MD       Or  . acetaminophen (TYLENOL) suppository 650 mg  650 mg Rectal Q6H PRN Hollice EspySendil K Krishnan, MD      . cefTAZidime (FORTAZ) 1 g in dextrose 5 % 50 mL IVPB  1 g Intravenous 3 times per day Otho Bellowserri L Green, RPH   1 g at 08/31/15 0531  . collagenase (SANTYL) ointment   Topical BID Chevis PrettyPaul Toth III, MD      . iohexol (OMNIPAQUE) 300 MG/ML solution 34 mL  34 mL  Intravenous Once PRN Medication Radiologist, MD   34 mL at 08/30/15 1805  . lacosamide (VIMPAT) 100 mg in sodium chloride 0.9 % 25 mL IVPB  100 mg Intravenous Q12H Hollice EspySendil K Krishnan, MD   100 mg at 08/31/15 1058  . levETIRAcetam (KEPPRA) tablet 1,500 mg  1,500 mg Oral BID Danford Badrew A Wofford, RPH      . metoprolol tartrate (LOPRESSOR) tablet 100 mg  100 mg Oral BID Hollice EspySendil K Krishnan, MD   100 mg at 08/31/15 1058  . morphine 2 MG/ML injection 1 mg  1 mg Intravenous Q4H Hollice EspySendil K Krishnan, MD   1 mg at 08/31/15 1042  . morphine 2 MG/ML injection 2 mg  2 mg Intravenous Q3H PRN Hollice EspySendil K Krishnan, MD   2 mg at 08/30/15 1707  . ondansetron (ZOFRAN) tablet 4 mg  4 mg Oral Q6H PRN Hollice EspySendil K Krishnan, MD       Or  . ondansetron [facility](ZOFRAN) injection 4 mg  4 mg Intravenous Q6H PRN Hollice EspySendil K Krishnan, MD      . sodium chloride 0.9 % injection 3 mL  3 mL Intravenous Q12H Hollice EspySendil K Krishnan, MD   3 mL at 08/31/15 1058  . vancomycin (VANCOCIN) IVPB 1000 mg/200 mL premix  1,000 mg Intravenous Q12H Otho Bellowserri L Green, RPH   1,000 mg at 08/31/15 0900   Do not use this list as official medication orders. Please verify with discharge summary.  Discharge Medications:   Medication List    ASK your doctor about these medications        acetaminophen 650 MG suppository  Commonly known as:  TYLENOL  Place 650 mg rectally every 4 (four) hours as needed for fever. For temp. 100 F or above     acetaminophen 325 MG tablet  Commonly known as:  TYLENOL  Take 2 tablets (650 mg total) by mouth daily.     carboxymethylcellulose 0.5 % Soln  Commonly known as:  REFRESH PLUS  Place 2 drops into both eyes 3 (three) times daily.     cholestyramine light 4 G packet  Commonly known as:  PREVALITE  Take 4 g by mouth 3 (three) times daily. mix in 8 oz. Of Liquid     cloNIDine 0.3 mg/24hr patch  Commonly known as:  CATAPRES - Dosed in mg/24 hr  Place 1 patch (0.3 mg total) onto the skin once a week.     famotidine 40 MG/5ML suspension   Commonly known as:  PEPCID  Take 2.5 mLs (20 mg total) by mouth every 12 (twelve) hours.     feeding supplement (ENSURE ENLIVE) Liqd  Take 237 mLs by mouth 2 (two) times daily between meals.     feeding supplement (PRO-STAT SUGAR FREE 64) Liqd  Take 30 mLs by mouth daily.     lacosamide 50 MG Tabs  tablet  Commonly known as:  VIMPAT  Take 2 tablets (100 mg total) by mouth 2 (two) times daily.     levETIRAcetam 750 MG tablet  Commonly known as:  KEPPRA  Take 1,500 mg by mouth 2 (two) times daily.     lisinopril 10 MG tablet  Commonly known as:  PRINIVIL,ZESTRIL  Take 10 mg by mouth daily.     loperamide 2 MG tablet  Commonly known as:  IMODIUM A-D  Take 4 mg by mouth 4 (four) times daily as needed for diarrhea or loose stools.     metoprolol 100 MG tablet  Commonly known as:  LOPRESSOR  Take 1 tablet (100 mg total) by mouth 2 (two) times daily.     minoxidil 10 MG tablet  Commonly known as:  LONITEN  Take 1 tablet (10 mg total) by mouth 2 (two) times daily.     mirtazapine 7.5 MG tablet  Commonly known as:  REMERON  Take 7.5 mg by mouth at bedtime.     multivitamin with minerals Tabs tablet  Take 1 tablet by mouth daily with breakfast.     traZODone 50 MG tablet  Commonly known as:  DESYREL  Take 25 mg by mouth at bedtime as needed for sleep.     vitamin C 1000 MG tablet  Take 1,000 mg by mouth daily.     Vitamin D3 5000 UNITS Tabs  Take 1 tablet by mouth daily with breakfast.        Relevant Imaging Results:  Relevant Lab Results:  Recent Labs    Additional Information  [facility] # [id number])  Phung Kotas, Dickey Gave, LCSW

## 2015-08-31 NOTE — Progress Notes (Signed)
Physical Therapy Wound Treatment Patient Details  Name: [name] MRN: [id number] Date of Birth: 21-Aug-1966  Today's Date: 08/31/2015 Time: 4970-2637 Time Calculation (min): 40 min  Subjective  Subjective: Pt is a 49 year old female admitted for sepsis secondary to sacral abscess/stage IV decubitus ulcer of sacral region Patient and Family Stated Goals: pt nonverbal Prior Treatments: bedside surgical debridement by MD  Pain Score:  Premedicated with IV meds, monitored HR and grimacing for pain, HR up to 135   Wound Assessment                                                                                                                        Wound / Incision (Open or Dehisced) 08/30/15 Other (Comment) Sacrum Mid Stage IV Pressure Sacral Pressure Ulcer (Active)  Dressing Type Moist to dry;Silicone dressing 85/88/5027  1:00 PM  Dressing Changed Changed 08/31/2015  1:00 PM  Dressing Status Old drainage 08/31/2015  1:00 PM  Dressing Change Frequency Twice a day 08/31/2015  1:00 PM  Site / Wound Assessment Brown 08/31/2015  1:00 PM  % Wound base Red or Granulating 5% 08/31/2015  1:00 PM  % Wound base Other (Comment) 95% 08/31/2015  1:00 PM  Peri-wound Assessment Intact 08/31/2015  1:00 PM  Wound Length (cm) 10 cm 08/30/2015  1:00 PM  Wound Width (cm) 10 cm 08/30/2015  1:00 PM  Tunneling (cm) 2.5 08/30/2015  1:00 PM  Margins Unattached edges (unapproximated) 08/31/2015  1:00 PM  Drainage Amount Copious 08/31/2015  1:00 PM  Drainage Description Purulent 08/31/2015  1:00 PM  Treatment Debridement (Selective);Hydrotherapy (Pulse lavage);Packing (Saline gauze) 08/31/2015  1:00 PM   Hydrotherapy Pulsed lavage therapy - wound location: sacral pressure ulcer Pulsed Lavage with Suction (psi): 12 psi Pulsed Lavage with Suction - Normal Saline Used: 2000 mL Pulsed Lavage Tip: Tip with splash shield Selective Debridement Selective Debridement - Location:  eschar/slough of sacral pressure ulcer Selective Debridement - Tools Used: Forceps;Scissors Selective Debridement - Tissue Removed: eschar, slough   Wound Assessment and Plan  Wound Therapy - Assess/Plan/Recommendations Wound Therapy - Clinical Statement: Pt would benefit from hydrotheray to promote wound healing and remove necrotic tissue to reduce bioburden. Wound Therapy - Functional Problem List: medically complex pt, limited mobility Factors Delaying/Impairing Wound Healing: Immobility;Multiple medical problems;Incontinence Hydrotherapy Plan: Debridement;Dressing change;Patient/family education;Pulsatile lavage with suction Wound Therapy - Frequency: 6X / week Wound Therapy - Follow Up Recommendations: Skilled nursing facility Wound Plan: Perform pulsatile lavage, selective debridement and dressing changes to assist with wound healing and reducing bioburden.  10/26 seen with WOC and requested santyl   Wound Therapy Goals- Improve the function of patient's integumentary system by progressing the wound(s) through the phases of wound healing (inflammation - proliferation - remodeling) by: Decrease Necrotic Tissue to: 50% Decrease Necrotic Tissue - Progress: Progressing toward goal Increase Granulation Tissue to: 50% Increase Granulation Tissue - Progress: Progressing toward goal Improve Drainage Characteristics: Mod Improve Drainage Characteristics - Progress: Progressing toward goal Goals/treatment plan/discharge plan  were made with and agreed upon by patient/family: No, Patient unable to participate in goals/treatment/discharge plan and family unavailable Time For Goal Achievement: 2 weeks Wound Therapy - Potential for Goals: Fair  Goals will be updated until maximal potential achieved or discharge criteria met.  Discharge criteria: when goals achieved, discharge from hospital, MD decision/surgical intervention, no progress towards goals, refusal/missing three consecutive treatments  without notification or medical reason.  GP     Clif Serio,[name] 08/31/2015, 2:02 PM [name], PT, DPT 08/31/2015 Pager: [phone]

## 2015-08-31 NOTE — Progress Notes (Signed)
PHARMACIST - PHYSICIAN COMMUNICATION CONCERNING:  IV Vancomycin  Pt currently on IV vancomycin and ceftazidime for sepsis secondary to presumed UTI.  Please see pharmacist note written early by Bernadene Personrew Wofford, PharmD for further details.    Vancomycin trough tonight is supratherapeutic at 35 mcg/ml.  RN aware and did not hang dose tonight.     RECOMMENDATION: Discontinue vancomycin tonight.  Follow renal function and recheck level in AM.  Need to discuss with MD duration of therapy if if restarting vancomycin warranted.   Haynes Hoehnolleen Ruchi Stoney, PharmD, BCPS 08/31/2015, 9:42 PM  Pager: [phone]

## 2015-08-31 NOTE — Progress Notes (Signed)
Patient refusing to eat 99% of food presented to her.  Took only one container of applesauce.  Discussed patient's poor po intake with son this am and it's consequences on the ultimate goal of healing her decubitus.  Nutritional support required.  Dietary consult requested.

## 2015-08-31 NOTE — Consult Note (Signed)
WOC wound follow up Wound type:Stage 4 pressure injury at sacrum. Seen in conjunction with hydrotherapy today Measurement: 9cm x 12cm with depth undetermined due to the continuing presence of necrotic tissue. Shelf or "lip" of necrotic tissue obscuring wound base. Wound bed:75% necrotic, 24% viable Drainage (amount, consistency, odor) moderate amount of serosanguinous exudate Periwound:intact Dressing procedure/placement/frequency: PT and WOC agree with the addition of an enzymatic debriding agent at this time; orders provided for Nursing for collagenase (Santyl).  While this is indicated daily, we will begin with twice daily application and monitor response. WOC nursing team will follow, albeit not closely. We will will remain available to this patient, the nursing, physical therapy, surgical and medical teams.  Please re-consult if needed between visits. Thanks, Ladona MowLaurie Janaria Mccammon, MSN, RN, GNP, [place], CWON-AP [phone])

## 2015-08-31 NOTE — Progress Notes (Signed)
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 On admission, sodium noted to be 158, white count of 15 and patient felt to be in sepsis secondary to a stage IV sacral decubitus ulcer with large amounts of pus drainage. Patient aggressively hydrated and given antibiotics and admitted to stepdown. Also noted to have urinary tract infection  Today, patient looks to be doing much better. Vital signs stable. White count slowly improving. She is more alert. Seen by general surgery who recommended CT for better determination of abscess. Blood cultures have grown out preliminary gram-negative rods. Awaiting further cultures. Nonverbal, but does shake her head yes when asked if she is having pain, had loose stools which are blood tinged.   Assessment/Plan: Active Problems:   HTN (hypertension): Holding antihypertensives , very well controlled.     Obesity: Patient meets criteria with BMI greater than 30    Seizures ([place]): Seizure medications changed to IV, if she continues to remain stable, change back to by mouth (1-1 ratio) tomorrow    Hypernatremia: Patient has had this issue in the past. Likely secondary to dehydration from sepsis. Despite aggressive IV fluids, sodium did not change much overnight, will change to half-normal saline. Improving.   Hypokalemia: replete as needed.     Dysphagia: Given improvement in alertness, patient put back on her baseline diet: Dysphagia 1 with thin liquids and full assistance    Sepsis ([place]) secondary to sacral abscess/stage IV decubitus ulcer of sacral region: Patient met criteria on  admission given hypotension, leukocytosis, fever and sacral source. Responded well to aggressive IV fluids and antibiotics. Wound care consulted and recommendations given. She is getting hydrotherapy with PT. 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 No gluteal abscess. Mild proctitis.   Air mattress ordered.  Have ordered scheduled morphine since patient is unable to communicate pain. She was started on IV heparin overnight for the IV thrombus, but earlier this am, she had blood tinged stools and we had to stop the IV heparin. Repeat h&h slightly dropped to 8 from 8.3. Requested IR consult for IVC filter and one was placed on 10/25.    Anemia of blood loss and chronic disease: Baseline hemoglobin around 10, dropped to 8. Stool for occult blood is positive and we have stopped the IV heparin.  Anemia panel shows elevated ferritin.      UTI (lower urinary tract infection):  Urine cultures show gram negative rods. Will be covered by IV antibiotics    Palliative care encounter: Have asked palliative care for assistance. PLAN TO continue the same level of aggressive care.   Code Status: Full code as confirmed by patient's son  Family Communication: none at bedside, called son and left message.   Disposition Plan: transfer to telemetry.    Consultants:  General surgery  Palliative care  IR  Procedures:  None  Antibiotics:  IV Fortaz 10/23-present  IV vancomycin 10/23-present   Objective: BP 104/76 mmHg  Pulse 107  Temp(Src) 100.4 F (38 C) (  Core (Comment))  Resp 30  Ht 5' 7" (1.702 m)  Wt 79.9 kg (176 lb 2.4 oz)  BMI 27.58 kg/m2  SpO2 97%  Intake/Output Summary (Last 24 hours) at 08/31/15 0737 Last data filed at 08/31/15 0700  Gross per 24 hour  Intake   4071 ml  Output    865 ml  Net   3206 ml    Filed Weights   08/28/15 1820  Weight: 79.9 kg (176 lb 2.4 oz)    Exam:   General:  Nonverbal, no acute distress  Cardiovascular: Regular rhythm, mild tachycardia  Respiratory: Clear to auscultation bilaterally  Abdomen: Soft, few bowel sounds, nondistended  Musculoskeletal: Trace pitting edema   Data Reviewed: Basic Metabolic Panel:  Recent Labs Lab 08/28/15 1415 08/29/15 0340 08/30/15 0350 08/31/15 0345  NA 158* 157* 153* 151*  K 4.3 4.0 3.4* 3.4*  CL >130* >130* 129* 127*  CO2 19* 16* 18* 18*  GLUCOSE 104* 103* 90 100*  BUN 47* 32* 19 16  CREATININE 1.04* 0.71 0.65 0.63  CALCIUM 9.4 8.6* 8.5* 8.6*   Liver Function Tests:  Recent Labs Lab 08/28/15 1415  AST 16  ALT 11*  ALKPHOS 71  BILITOT 0.6  PROT 7.4  ALBUMIN 2.5*   No results for input(s): LIPASE, AMYLASE in the last 168 hours. No results for input(s): AMMONIA in the last 168 hours. CBC:  Recent Labs Lab 08/28/15 1415 08/29/15 0340 08/30/15 0350 08/30/15 1355 08/31/15 0345  WBC 15.5* 14.2* 10.8*  --  7.5  NEUTROABS 9.6*  --   --   --   --   HGB 10.0* 8.7* 8.3* 7.9* 8.0*  HCT 31.7* 27.3* 26.6* 24.4* 25.0*  MCV 88.1 88.3 89.3  --  87.7  PLT 226 195 205  --  198   Cardiac Enzymes:    Recent Labs Lab 08/28/15 2020  TROPONINI 0.05*   BNP (last 3 results)  Recent Labs  03/18/15 1240 08/29/15 1305  BNP 69.7 47.2    ProBNP (last 3 results) No results for input(s): PROBNP in the last 8760 hours.  CBG: No results for input(s): GLUCAP in the last 168 hours.  Recent Results (from the past 240 hour(s))  Blood Culture (routine x 2)     Status: None (Preliminary result)   Collection Time: 08/28/15  2:15 PM  Result Value Ref Range Status   Specimen Description RIGHT ANTECUBITAL  Final   Special Requests BOTTLES DRAWN AEROBIC AND ANAEROBIC 5CC  Final   Culture  Setup Time   Final    GRAM NEGATIVE RODS AEROBIC BOTTLE ONLY CRITICAL RESULT CALLED TO, READ BACK BY AND VERIFIED  WITH: J. THIGPEN,RN AT 2751 ON [id number] BY Rhea Bleacher    Culture   Final    GRAM NEGATIVE RODS Performed at [facility]    Report Status PENDING  Incomplete  Blood Culture (routine x 2)     Status: None (Preliminary result)   Collection Time: 08/28/15  2:20 PM  Result Value Ref Range Status   Specimen Description LEFT ANTECUBITAL  Final   Special Requests BOTTLES DRAWN AEROBIC AND ANAEROBIC 5CC  Final   Culture   Final    NO GROWTH 2 DAYS Performed at [facility]    Report Status PENDING  Incomplete  Urine culture     Status: None (Preliminary result)   Collection Time: 08/28/15  2:29 PM  Result Value Ref Range Status   Specimen Description URINE, CATHETERIZED  Final   Special  Requests NONE  Final   Culture   Final    >=100,000 COLONIES/mL ESCHERICHIA COLI Performed at [facility]    Report Status PENDING  Incomplete  MRSA PCR Screening     Status: None   Collection Time: 08/28/15  6:00 PM  Result Value Ref Range Status   MRSA by PCR NEGATIVE NEGATIVE Final    Comment:        The GeneXpert MRSA Assay (FDA approved for NASAL specimens only), is one component of a comprehensive MRSA colonization surveillance program. It is not intended to diagnose MRSA infection nor to guide or monitor treatment for MRSA infections.      Studies: No results found.  Scheduled Meds: . cefTAZidime (FORTAZ)  IV  1 g Intravenous 3 times per day  . lacosamide (VIMPAT) IV  100 mg Intravenous Q12H  . levETIRAcetam  1,500 mg Intravenous Q12H  . metoprolol  100 mg Oral BID  .  morphine injection  1 mg Intravenous Q4H  . sodium chloride  3 mL Intravenous Q12H  . vancomycin  1,000 mg Intravenous Q12H    Continuous Infusions: . sodium chloride 100 mL/hr at 08/31/15 0531     Time spent: 35 minutes  [facility] Pager [phone] If 7PM-7AM, please contact night-coverage at [email or web address], password [facility] 08/31/2015, 7:37 AM  LOS: 3 days

## 2015-09-01 DIAGNOSIS — M4628 Osteomyelitis of vertebra, sacral and sacrococcygeal region: Secondary | ICD-10-CM

## 2015-09-01 LAB — BASIC METABOLIC PANEL
Anion gap: 7 (ref 5–15)
BUN: 14 mg/dL (ref 6–20)
CO2: 18 mmol/L — ABNORMAL LOW (ref 22–32)
Calcium: 8.5 mg/dL — ABNORMAL LOW (ref 8.9–10.3)
Chloride: 122 mmol/L — ABNORMAL HIGH (ref 101–111)
Creatinine, Ser: 0.63 mg/dL (ref 0.44–1.00)
GFR calc Af Amer: >60 mL/min (ref >60)
GFR calc non Af Amer: >60 mL/min (ref >60)
Glucose, Bld: 93 mg/dL (ref 65–99)
Potassium: 3.2 mmol/L — ABNORMAL LOW (ref 3.5–5.1)
Sodium: 147 mmol/L — ABNORMAL HIGH (ref 135–145)

## 2015-09-01 LAB — GI PATHOGEN PANEL BY PCR, STOOL
C DIFFICILE TOXIN A/B: NOT DETECTED
CAMPYLOBACTER BY PCR: NOT DETECTED
Cryptosporidium by PCR: NOT DETECTED
E COLI 0157 BY PCR: NOT DETECTED
E coli (ETEC) LT/ST: NOT DETECTED
E coli (STEC): NOT DETECTED
G LAMBLIA BY PCR: NOT DETECTED
NOROVIRUS G1/G2: NOT DETECTED
ROTAVIRUS A BY PCR: NOT DETECTED
SALMONELLA BY PCR: NOT DETECTED
SHIGELLA BY PCR: NOT DETECTED

## 2015-09-01 LAB — CBC
HCT: 23.7 % — ABNORMAL LOW (ref 36.0–46.0)
Hemoglobin: 7.9 g/dL — ABNORMAL LOW (ref 12.0–15.0)
MCH: 29 pg (ref 26.0–34.0)
MCHC: 33.3 g/dL (ref 30.0–36.0)
MCV: 87.1 fL (ref 78.0–100.0)
PLATELETS: 184 10*3/uL (ref 150–400)
RBC: 2.72 MIL/uL — ABNORMAL LOW (ref 3.87–5.11)
RDW: 16.1 % — AB (ref 11.5–15.5)
WBC: 6 10*3/uL (ref 4.0–10.5)

## 2015-09-01 LAB — CULTURE, BLOOD (ROUTINE X 2)

## 2015-09-01 LAB — VANCOMYCIN, RANDOM: Vancomycin Rm: 27 ug/mL

## 2015-09-01 MED ORDER — POTASSIUM CHLORIDE CRYS ER 20 MEQ PO TBCR
40.0000 meq | EXTENDED_RELEASE_TABLET | Freq: Two times a day (BID) | ORAL | Status: AC
  Administered 2015-09-01 (×2): 40 meq via ORAL
  Filled 2015-09-01 (×3): qty 2

## 2015-09-01 NOTE — Progress Notes (Signed)
   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  Patient and Family Stated Goals pt nonverbal  Prior Treatments bedside surgical debridement by MD  Evaluation and Treatment  Evaluation and Treatment Procedures Explained to Patient/Family Yes  Evaluation and Treatment Procedures Patient unable to consent due to mental status  Wound / Incision (Open or Dehisced) 08/30/15 Other (Comment) Sacrum Mid Stage IV Pressure Sacral Pressure Ulcer  Date First Assessed/Time First Assessed: 08/30/15 1205   Wound Type: (c) Other (Comment)  Location: Sacrum  Location Orientation: Mid  Wound Description (Comments): Stage IV Pressure Sacral Pressure Ulcer  Present on Admission: Yes  Dressing Type Moist to dry;Silicone dressing  Dressing Changed Changed  Dressing Status Old drainage  Dressing Change Frequency Twice a day  Site / Wound Assessment Brown  % Wound base Red or Granulating 10%  % Wound base Other (Comment) 90% (tan/brown slough/eschar)  Peri-wound Assessment Intact  Wound Length (cm) 10 cm  Wound Width (cm) 10 cm  Tunneling (cm) 2.5  Margins Unattached edges (unapproximated)  Drainage Amount Copious  Drainage Description Purulent  Hydrotherapy  Pulsed lavage therapy - wound location sacral pressure ulcer  Pulsed Lavage with Suction (psi) 12 psi  Pulsed Lavage with Suction - Normal Saline Used 1000 mL  Pulsed Lavage Tip Tip with splash shield  Selective Debridement  Selective Debridement - Location eschar/slough of sacral pressure ulcer  Selective Debridement - Tools Used Forceps;Scissors  Selective Debridement - Tissue Removed eschar, slough  Wound Therapy - Assess/Plan/Recommendations  Wound Therapy - Clinical Statement patient was more alert, gritting teeth upon arrival. debrided more nonviable tissue.Santyl started and to get air mattress.  Will ClovisJennings PA in to inspect the wound.  Wound Therapy - Functional Problem List medically complex pt, limited mobility  Factors Delaying/Impairing Wound Healing Immobility;Multiple medical problems;Incontinence  Hydrotherapy Plan Debridement;Dressing change;Patient/family education;Pulsatile lavage with suction  Wound Therapy - Frequency 6X / week  Wound Therapy - Follow Up Recommendations Skilled nursing facility  Wound Plan Perform pulsatile lavage, selective debridement and dressing changes to assist with wound healing and reducing bioburden.  10/26 seen with WOC and requested santyl   Wound Therapy Goals - Improve the function of patient's integumentary system by progressing the wound(s) through the phases of wound healing by:  Decrease Necrotic Tissue to 50%  Increase Granulation Tissue to 50%  Improve Drainage Characteristics Mod  Goals/treatment plan/discharge plan were made with and agreed upon by patient/family No, Patient unable to participate in goals/treatment/discharge plan and family unavailable  Time For Goal Achievement 2 weeks  Wound Therapy - Potential for Goals Meghan BarsFair  [name] PT [phone]

## 2015-09-01 NOTE — NC FL2 (Signed)
[place] MEDICAID FL2 LEVEL OF CARE SCREENING TOOL     IDENTIFICATION  Patient Name: Meghan PoreLaura Marie Zent Birthdate: 05/30/1966 Sex: female Admission Date (Current Location): 08/28/2015  [facility] [place]     Facility and Address:  [facility],  [address], [place] [id number]      Provider Number: [id number]  Attending Physician Name and Address:  Kathlen ModyVijaya Akula, MD  Relative Name and Phone Number:       Current Level of Care: Hospital Recommended Level of Care: Skilled Nursing Facility Prior Approval Number:    Date Approved/Denied:   PASRR Number: [id number]  Discharge Plan: SNF    Current Diagnoses: Patient Active Problem List   Diagnosis Date Noted  . DNR (do not resuscitate) discussion   . Adult failure to thrive   . Palliative care encounter 08/29/2015  . Sepsis (HCC) 08/28/2015  . UTI (lower urinary tract infection) 08/28/2015  . Decubitus ulcer of sacral region, stage 4 (HCC) 08/28/2015  . Abscess, sacrum (HCC) 08/28/2015  . Pressure ulcer 06/02/2015  . SAH (subarachnoid hemorrhage) (HCC) 06/01/2015  . Acute encephalopathy 06/01/2015  . Localization-related symptomatic epilepsy and epileptic syndromes with complex partial seizures, not intractable, without status epilepticus (HCC) 05/06/2015  . Hx of ischemic right ACA stroke 05/06/2015  . History of ischemic left ACA stroke 05/06/2015  . C. difficile diarrhea 03/20/2015  . Difficult intravenous access   . Dysphagia   . Hypernatremia 03/16/2015  . Dehydration 03/16/2015  . Seizures (HCC) 07/05/2014  . Stroke (HCC) 07/05/2014  . Chronic respiratory failure (HCC) 04/07/2014  . Tracheitis 03/31/2014  . Pneumonia 03/31/2014  . HCAP (healthcare-associated pneumonia) 03/07/2014  . DVT (deep venous thrombosis) (HCC) 03/07/2014  . BP (high blood pressure) 09/26/2013  . Aneurysm (HCC) 09/25/2013  . Brain aneurysm 08/17/2013  . Occipital neuralgia 08/17/2013  . Sinus headache  08/17/2013  . Headache(784.0) 06/02/2013  . Palpitations 03/04/2013  . Edema 03/04/2013  . Anxiety state, unspecified 03/04/2013  . Tobacco abuse 02/27/2013  . Chest discomfort 02/10/2013  . Lower abdominal pain 05/01/2012  . Allergic rhinitis 05/01/2012  . HTN (hypertension) 02/25/2012  . Obesity 02/25/2012    Orientation ACTIVITIES/SOCIAL BLADDER RESPIRATION       Passive Indwelling catheter Normal  BEHAVIORAL SYMPTOMS/MOOD NEUROLOGICAL BOWEL NUTRITION STATUS   (no behaviors)   Incontinent Diet (DSY 1)  PHYSICIAN VISITS COMMUNICATION OF NEEDS Height & Weight Skin    Does not communicate 5\' 7"  (170.2 cm) 176 lbs. PU Stage and Appropriate Care          AMBULATORY STATUS RESPIRATION     (non ambulatorty) Normal      Personal Care Assistance Level of Assistance  Total care Bathing Assistance: Maximum assistance     Total Care Assistance: Maximum assistance    Functional Limitations Info  Speech (non verbal)     Speech Info: Impaired (non verbal)       SPECIAL CARE FACTORS FREQUENCY                      Additional Factors Info  Code Status Code Status Info: Full Code             Current Medications (09/01/2015): Current Facility-Administered Medications  Medication Dose Route Frequency Provider Last Rate Last Dose  . 0.45 % sodium chloride infusion   Intravenous Continuous Hollice EspySendil K Krishnan, MD 100 mL/hr at 08/31/15 1824 100 mL/hr at 08/31/15 1824  . 0.9 %  sodium chloride infusion  500  mL Intravenous Q30 min PRN Hollice Espy, MD      . acetaminophen (TYLENOL) tablet 650 mg  650 mg Oral Q6H PRN Hollice Espy, MD       Or  . acetaminophen (TYLENOL) suppository 650 mg  650 mg Rectal Q6H PRN Hollice Espy, MD      . cefTAZidime (FORTAZ) 1 g in dextrose 5 % 50 mL IVPB  1 g Intravenous 3 times per day Otho Bellows, RPH   1 g at 09/01/15 0554  . collagenase (SANTYL) ointment   Topical BID Chevis Pretty III, MD      . iohexol (OMNIPAQUE) 300  MG/ML solution 34 mL  34 mL Intravenous Once PRN Medication Radiologist, MD   34 mL at 08/30/15 1805  . lacosamide (VIMPAT) 100 mg in sodium chloride 0.9 % 25 mL IVPB  100 mg Intravenous Q12H Hollice Espy, MD   100 mg at 08/31/15 2218  . levETIRAcetam (KEPPRA) 100 MG/ML solution 1,500 mg  1,500 mg Oral BID Kathlen Mody, MD   1,500 mg at 09/01/15 0811  . metoprolol tartrate (LOPRESSOR) tablet 100 mg  100 mg Oral BID Hollice Espy, MD   100 mg at 08/31/15 2220  . morphine 2 MG/ML injection 1 mg  1 mg Intravenous Q4H Hollice Espy, MD   1 mg at 09/01/15 0554  . morphine 2 MG/ML injection 2 mg  2 mg Intravenous Q3H PRN Hollice Espy, MD   2 mg at 08/31/15 2057  . ondansetron (ZOFRAN) tablet 4 mg  4 mg Oral Q6H PRN Hollice Espy, MD       Or  . ondansetron [facility]) injection 4 mg  4 mg Intravenous Q6H PRN Hollice Espy, MD      . potassium chloride SA (K-DUR,KLOR-CON) CR tablet 40 mEq  40 mEq Oral BID Kathlen Mody, MD      . sodium chloride 0.9 % injection 3 mL  3 mL Intravenous Q12H Hollice Espy, MD   3 mL at 08/31/15 1058   Do not use this list as official medication orders. Please verify with discharge summary.  Discharge Medications:   Medication List    ASK your doctor about these medications        acetaminophen 650 MG suppository  Commonly known as:  TYLENOL  Place 650 mg rectally every 4 (four) hours as needed for fever. For temp. 100 F or above     acetaminophen 325 MG tablet  Commonly known as:  TYLENOL  Take 2 tablets (650 mg total) by mouth daily.     carboxymethylcellulose 0.5 % Soln  Commonly known as:  REFRESH PLUS  Place 2 drops into both eyes 3 (three) times daily.     cholestyramine light 4 G packet  Commonly known as:  PREVALITE  Take 4 g by mouth 3 (three) times daily. mix in 8 oz. Of Liquid     cloNIDine 0.3 mg/24hr patch  Commonly known as:  CATAPRES - Dosed in mg/24 hr  Place 1 patch (0.3 mg total) onto the skin once a week.      famotidine 40 MG/5ML suspension  Commonly known as:  PEPCID  Take 2.5 mLs (20 mg total) by mouth every 12 (twelve) hours.     feeding supplement (ENSURE ENLIVE) Liqd  Take 237 mLs by mouth 2 (two) times daily between meals.     feeding supplement (PRO-STAT SUGAR FREE 64) Liqd  Take 30 mLs by mouth daily.  lacosamide 50 MG Tabs tablet  Commonly known as:  VIMPAT  Take 2 tablets (100 mg total) by mouth 2 (two) times daily.     levETIRAcetam 750 MG tablet  Commonly known as:  KEPPRA  Take 1,500 mg by mouth 2 (two) times daily.     lisinopril 10 MG tablet  Commonly known as:  PRINIVIL,ZESTRIL  Take 10 mg by mouth daily.     loperamide 2 MG tablet  Commonly known as:  IMODIUM A-D  Take 4 mg by mouth 4 (four) times daily as needed for diarrhea or loose stools.     metoprolol 100 MG tablet  Commonly known as:  LOPRESSOR  Take 1 tablet (100 mg total) by mouth 2 (two) times daily.     minoxidil 10 MG tablet  Commonly known as:  LONITEN  Take 1 tablet (10 mg total) by mouth 2 (two) times daily.     mirtazapine 7.5 MG tablet  Commonly known as:  REMERON  Take 7.5 mg by mouth at bedtime.     multivitamin with minerals Tabs tablet  Take 1 tablet by mouth daily with breakfast.     traZODone 50 MG tablet  Commonly known as:  DESYREL  Take 25 mg by mouth at bedtime as needed for sleep.     vitamin C 1000 MG tablet  Take 1,000 mg by mouth daily.     Vitamin D3 5000 UNITS Tabs  Take 1 tablet by mouth daily with breakfast.        Relevant Imaging Results:  Relevant Lab Results:  Recent Labs    Additional Information ZO#[id number]  Liliana Cline, LCSW

## 2015-09-01 NOTE — Progress Notes (Signed)
ANTIBIOTIC CONSULT NOTE - FOLLOW UP  Pharmacy Consult for Vancomycin, Ceftazidime Indication: rule out sepsis  Allergies  Allergen Reactions  . Amlodipine Swelling  . Penicillins Swelling  . Latex Rash    Unknown   . Other Other (See Comments)    Natural Rubber- Unknown     Patient Measurements: Height: 5\' 7"  (170.2 cm) Weight: 176 lb 2.4 oz (79.9 kg) IBW/kg (Calculated) : 61.6  Vital Signs: Temp: 98.4 F (36.9 C) (10/27 0800) Temp Source: Oral (10/27 0800) BP: 132/99 mmHg (10/27 0800) Pulse Rate: 108 (10/27 0800) Intake/Output from previous day: 10/26 0701 - 10/27 0700 In: 1970 [I.V.:1600; IV Piggyback:370] Out: 955 [Urine:955] Intake/Output from this shift: Total I/O In: 50 [P.O.:50] Out: -   Labs:  Recent Labs  08/30/15 0350 08/30/15 1355 08/31/15 0345 09/01/15 0508  WBC 10.8*  --  7.5 6.0  HGB 8.3* 7.9* 8.0* 7.9*  PLT 205  --  198 184  CREATININE 0.65  --  0.63 0.63   Estimated Creatinine Clearance: 93.5 mL/min (by C-G formula based on Cr of 0.63).  Recent Labs  08/31/15 1030 09/01/15 0508  VANCOTROUGH 35*  --   VANCORANDOM  --  27     Microbiology: Recent Results (from the past 720 hour(s))  Blood Culture (routine x 2)     Status: None   Collection Time: 08/28/15  2:15 PM  Result Value Ref Range Status   Specimen Description RIGHT ANTECUBITAL  Final   Special Requests BOTTLES DRAWN AEROBIC AND ANAEROBIC 5CC  Final   Culture  Setup Time   Final    GRAM NEGATIVE RODS AEROBIC BOTTLE ONLY CRITICAL RESULT CALLED TO, READ BACK BY AND VERIFIED WITH: J. THIGPEN,RN AT 1123 ON [id number] BY Lucienne CapersS. YARBROUGH    Culture   Final    PROTEUS MIRABILIS Performed at [facility]    Report Status 09/01/2015 FINAL  Final   Organism ID, Bacteria PROTEUS MIRABILIS  Final      Susceptibility   Proteus mirabilis - MIC*    AMPICILLIN <=2 SENSITIVE Sensitive     CEFAZOLIN <=4 SENSITIVE Sensitive     CEFEPIME <=1 SENSITIVE Sensitive     CEFTAZIDIME <=1  SENSITIVE Sensitive     CEFTRIAXONE <=1 SENSITIVE Sensitive     CIPROFLOXACIN >=4 RESISTANT Resistant     GENTAMICIN <=1 SENSITIVE Sensitive     IMIPENEM 4 SENSITIVE Sensitive     TRIMETH/SULFA <=20 SENSITIVE Sensitive     AMPICILLIN/SULBACTAM <=2 SENSITIVE Sensitive     PIP/TAZO <=4 SENSITIVE Sensitive     * PROTEUS MIRABILIS  Blood Culture (routine x 2)     Status: None (Preliminary result)   Collection Time: 08/28/15  2:20 PM  Result Value Ref Range Status   Specimen Description LEFT ANTECUBITAL  Final   Special Requests BOTTLES DRAWN AEROBIC AND ANAEROBIC 5CC  Final   Culture   Final    NO GROWTH 3 DAYS Performed at [facility]    Report Status PENDING  Incomplete  Urine culture     Status: None   Collection Time: 08/28/15  2:29 PM  Result Value Ref Range Status   Specimen Description URINE, CATHETERIZED  Final   Special Requests NONE  Final   Culture   Final    >=100,000 COLONIES/mL ESCHERICHIA COLI Performed at [facility]    Report Status 08/31/2015 FINAL  Final   Organism ID, Bacteria ESCHERICHIA COLI  Final      Susceptibility   Escherichia coli -  MIC*    AMPICILLIN 8 SENSITIVE Sensitive     CEFAZOLIN <=4 SENSITIVE Sensitive     CEFTRIAXONE <=1 SENSITIVE Sensitive     CIPROFLOXACIN >=4 RESISTANT Resistant     GENTAMICIN <=1 SENSITIVE Sensitive     IMIPENEM <=0.25 SENSITIVE Sensitive     NITROFURANTOIN 128 RESISTANT Resistant     TRIMETH/SULFA <=20 SENSITIVE Sensitive     AMPICILLIN/SULBACTAM 4 SENSITIVE Sensitive     PIP/TAZO <=4 SENSITIVE Sensitive     * >=100,000 COLONIES/mL ESCHERICHIA COLI  MRSA PCR Screening     Status: None   Collection Time: 08/28/15  6:00 PM  Result Value Ref Range Status   MRSA by PCR NEGATIVE NEGATIVE Final    Comment:        The GeneXpert MRSA Assay (FDA approved for NASAL specimens only), is one component of a comprehensive MRSA colonization surveillance program. It is not intended to diagnose MRSA infection  nor to guide or monitor treatment for MRSA infections.     Anti-infectives    Start     Dose/Rate Route Frequency Ordered Stop   08/28/15 2200  cefTAZidime (FORTAZ) 1 g in dextrose 5 % 50 mL IVPB     1 g 100 mL/hr over 30 Minutes Intravenous 3 times per day 08/28/15 1439     08/28/15 2000  vancomycin (VANCOCIN) IVPB 1000 mg/200 mL premix  Status:  Discontinued     1,000 mg 200 mL/hr over 60 Minutes Intravenous Every 12 hours 08/28/15 1441 08/31/15 2131   08/28/15 1430  cefTAZidime (FORTAZ) 2 g in dextrose 5 % 50 mL IVPB     2 g 100 mL/hr over 30 Minutes Intravenous  Once 08/28/15 1415 08/28/15 1456   08/28/15 1415  vancomycin (VANCOCIN) IVPB 1000 mg/200 mL premix     1,000 mg 200 mL/hr over 60 Minutes Intravenous  Once 08/28/15 1408 08/28/15 1547      [name] to ED from SNF for AMS, leukocytosis, tachycardia. Hx of CVA with L hemiparesis. PCN allergy noted with rx of " swelling", has tolerated Cefepime previously. Begin Vancomycin and Ceftazidime, pharmacy to dose.    10/23 >> Vanc >> 10/23 >> Ceftaz  >>    10/23 MRSA PCR: neg 10/23 blood: 1/2 Proteus - resistant only to Cipro 10/23 urine: > 100k E coli - resistant only to Cipro and nitro 10/25 GI path panel: IP No prior positive cultures in Epic  Dose changes/levels:  10/26 2030 VT = 35 on 1g q12h (8AM dose given 1 hr late) 10/27 0500 VT = 27. T1/2 ~ 23hrs.       Today, 09/01/2015: Temp: AF now WBC: improved to wnl Renal: mild AKI resolved CrCl 93 CG (likely overestimate d/t paralysis) 10/24 CT pelvis shows no residual abscess (no mention of osteo presence or absence)  Goal of Therapy:  Vancomycin trough level 15-20 mcg/ml  Eradication of infection Appropriate antibiotic dosing for indication and renal function  Plan:  Day 5 antibiotics  Continue to hold Vancomycin doses. Will recheck a random level this PM. Consider stopping Vancomycin permanently given GNR in blood and urine.    Continue Ceftaz 1g  q8h. Consider narrowing to Ancef or Rocephin.   Follow clinical course, renal function, culture results as available.  Follow for de-escalation of antibiotics and LOT.  Charolotte Eke, PharmD, pager [phone]. 09/01/2015,11:05 AM.

## 2015-09-01 NOTE — Progress Notes (Signed)
Date: September 01, 2015 Chart reviewed for concurrent status and case management needs. Will continue to follow patient for changes and needs: Rhonda Davis, RN, BSN, CCM   [phone] 

## 2015-09-01 NOTE — Progress Notes (Signed)
Subjective: No real change, hydrotherapy ongoing.  Objective: Vital signs in last 24 hours: Temp:  [97.7 F (36.5 C)-100.2 F (37.9 C)] 98.4 F (36.9 C) (10/27 0800) Pulse Rate:  [79-126] 108 (10/27 0800) Resp:  [20-35] 22 (10/27 0800) BP: (132-164)/(82-119) 132/99 mmHg (10/27 0800) SpO2:  [92 %-100 %] 99 % (10/27 0800) Last BM Date: 08/31/15  Intake/Output from previous day: 10/26 0701 - 10/27 0700 In: 1970 [I.V.:1600; IV Piggyback:370] Out: 955 [Urine:955] Intake/Output this shift: Total I/O In: 925 [P.O.:290; I.V.:600; IV Piggyback:35] Out: 325 [Urine:325]  General appearance: alert and non verbal shaking her head:  no Skin: Skin color, texture, turgor normal. No rashes or lesions or see picture  Tuesday 08/30/15    Today 09/01/15  Lab Results:   Recent Labs  08/31/15 0345 09/01/15 0508  WBC 7.5 6.0  HGB 8.0* 7.9*  HCT 25.0* 23.7*  PLT 198 184    BMET  Recent Labs  08/31/15 0345 09/01/15 0508  NA 151* 147*  K 3.4* 3.2*  CL 127* 122*  CO2 18* 18*  GLUCOSE 100* 93  BUN 16 14  CREATININE 0.63 0.63  CALCIUM 8.6* 8.5*   PT/INR  Recent Labs  08/30/15 0036  LABPROT 17.9*  INR 1.47     Recent Labs Lab 08/28/15 1415  AST 16  ALT 11*  ALKPHOS 71  BILITOT 0.6  PROT 7.4  ALBUMIN 2.5*     Lipase  No results found for: LIPASE   Studies/Results: Ir Ivc Filter Plmt / S&i /img Guid/mod Sed  08/31/2015  CLINICAL DATA:  49 year old female with a history of DVT, with anti coagulation contraindicated EXAM: 1. ULTRASOUND GUIDANCE FOR VASCULAR ACCESS OF THE RIGHT INTERNAL JUGULAR VEIN. 2. IVC VENOGRAM. 3. PERCUTANEOUS IVC FILTER PLACEMENT. ANESTHESIA/SEDATION: None CONTRAST:  34mL OMNIPAQUE IOHEXOL 300 MG/ML  SOLN FLUOROSCOPY TIME:  48 seconds PROCEDURE: The procedure, risks, benefits, and alternatives were explained to the patient. Specific risks discussed include bleeding, infection, contrast reaction, renal failure, IVC filter fracture,  migration, ilio caval thrombus (3% incidence), need for further procedure, need for further surgery, pulmonary embolism, cardiopulmonary collapse, death. Questions regarding the procedure were encouraged and answered. The patient understands and consents to the procedure. Ultrasound survey was performed with images stored and sent to PACs. The neck was prepped with Betadine in a sterile fashion, and a sterile drape was applied covering the operative field. A sterile gown and sterile gloves were used for the procedure. Local anesthesia was provided with 1% Lidocaine. A micropuncture needle was used access the right internal jugular vein under ultrasound. With excellent venous blood flow returned, and an .018 micro wire was passed through the needle. Small incision was made with an 11 blade scalpel. The needle was removed, and a micropuncture sheath was placed over the wire. The inner dilator and wire were removed, and an 035 Bentson wire was advanced under fluoroscopy into the IVC. Serial dilation of the soft tissue tract was performed with an 8 [place] dilator and subsequently a 10 [place] dilator. The delivery sheath for a retrievable Bard Denali filter was passed over the Bentson wire into the IVC. The wire was removed and small contrast was used to confirm IVC location. IVC cavagram performed. Dilator was removed, and the IVC filter was then delivered, positioned below the lowest renal vein. Repeat cavagram performed, and the catheter was removed. Manual pressure was used for hemostasis. Patient tolerated the procedure well and remained hemodynamically stable throughout. No complications were encountered and no significant blood loss  was encounter. COMPLICATIONS: None. FINDINGS: IVC venography demonstrates a normal caliber IVC with no evidence of thrombus. Renal veins are identified bilaterally. The IVC filter was successfully positioned below the level of the renal veins and is appropriately oriented. This IVC  filter has both permanent and retrievable indications. IMPRESSION: Status post placement of a retrievable IVC filter. Signed, Yvone NeuJaime S. Loreta AveWagner, DO [facility] PLAN: If the patient is deemed a candidate for anti coagulation, would recommend referral to Interventional Radiology Clinic for evaluation of IVC filter retrieval. If the patient can never be given anti coagulation and the filter should be permanent, would recommend retrieval of this temporary/retrieval IVC filter, and placement of a filter with a permanent indication. Electronically Signed   By: Gilmer MorJaime  Wagner D.O.   On: 08/31/2015 07:37    Medications: . cefTAZidime (FORTAZ)  IV  1 g Intravenous 3 times per day  . collagenase   Topical BID  . lacosamide (VIMPAT) IV  100 mg Intravenous Q12H  . levETIRAcetam  1,500 mg Oral BID  . metoprolol  100 mg Oral BID  .  morphine injection  1 mg Intravenous Q4H  . potassium chloride  40 mEq Oral BID  . 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 Antibiotics: Ceftazidime/Vancomycin day 4 DVT: IVC filter placed 08/30/15   Plan:  Continue local wound care and hydrotherapy, I have ask them to place air mattress on bed also.     LOS: 4 days    [name] 09/01/2015

## 2015-09-01 NOTE — Progress Notes (Signed)
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 On admission, sodium noted to be 158, white count of 15 and patient felt to be in sepsis secondary to a stage IV sacral decubitus ulcer with large amounts of pus drainage. Patient aggressively hydrated and given antibiotics and admitted to stepdown. Also noted to have urinary tract infection  Today, patient looks to be doing much better. Vital signs stable. White count slowly improving. She is more alert. Seen by general surgery who recommended CT for better determination of abscess. Blood cultures have grown out preliminary gram-negative rods. Awaiting further cultures. Nonverbal, but does shake her head yes when asked if she is having pain, had loose stools which are blood tinged.   Assessment/Plan: Active Problems:   HTN (hypertension): Holding antihypertensives , very well controlled.     Obesity: Patient meets criteria with BMI greater than 30    Seizures ([place]): Seizure medications changed to IV, if she continues to remain stable, change back to by mouth (1-1 ratio) tomorrow    Hypernatremia: Patient has had this issue in the past. Likely secondary to dehydration from sepsis. Despite aggressive IV fluids, sodium did not change much overnight, will change to half-normal saline. Improving.   Hypokalemia: replete as needed.     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 Responded well to aggressive IV fluids and antibiotics. Wound care consulted and recommendations given. She is getting hydrotherapy with PT. 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 No gluteal abscess. Mild proctitis.   Air mattress ordered.  Have ordered scheduled morphine since patient is unable to communicate pain. She was started on IV heparin overnight for the IV thrombus, but earlier this am, she had blood tinged stools and we had to stop the IV heparin. Repeat h&h slightly dropped to 8 from 8.3. Requested IR consult for IVC filter and one was placed on 10/25. Improved sepsis. D/c vancomycin. Resume fortaz.   Anemia of blood loss and chronic disease: Baseline hemoglobin around 10, dropped to 8. Stool for occult blood is positive and we have stopped the IV heparin.  Anemia panel shows elevated ferritin.      UTI (lower urinary tract infection):  Urine cultures show  E coli. Will be covered by IV antibiotics    Palliative care encounter: Have asked palliative care for assistance. PLAN TO continue the same level of aggressive care.   Code Status: Full code as confirmed by patient's son  Family Communication: none at bedside, called son and left message.   Disposition Plan: transfer to telemetry.    Consultants:  General surgery  Palliative care  IR  Procedures:  None  Antibiotics:  IV Fortaz 10/23-present  IV vancomycin 10/23-present   Objective: BP 140/104 mmHg  Pulse 91  Temp(Src) 98.7 F (37.1 C) (Oral)  Resp 22  Ht 5' 7" (1.702 m)  Wt 79.9 kg (176 lb 2.4 oz)  BMI 27.58 kg/m2  SpO2 100%  Intake/Output Summary (Last 24 hours) at 09/01/15 1849 Last data filed at 09/01/15 1740  Gross per 24 hour  Intake 1986.67 ml    Output    730 ml  Net 1256.67 ml   Filed Weights   08/28/15 1820  Weight: 79.9 kg (176 lb 2.4 oz)    Exam:   General:  Nonverbal, no acute distress  Cardiovascular: Regular rhythm, mild tachycardia  Respiratory: Clear to auscultation bilaterally  Abdomen: Soft, few bowel sounds, nondistended  Musculoskeletal: Trace pitting edema   Data Reviewed: Basic Metabolic Panel:  Recent Labs Lab 08/28/15 1415 08/29/15 0340 08/30/15 0350 08/31/15 0345 09/01/15 0508  NA 158* 157* 153* 151* 147*  K 4.3 4.0 3.4* 3.4* 3.2*  CL >130* >130* 129* 127* 122*  CO2 19* 16* 18* 18* 18*  GLUCOSE 104* 103* 90 100* 93  BUN 47* 32* _0 CREATININE 1.04* 0.71 0.65 0.63 0.63  CALCIUM 9.4 8.6* 8.5* 8.6* 8.5*   Liver Function Tests:  Recent Labs Lab 08/28/15 1415  AST 16  ALT 11*  ALKPHOS 71  BILITOT 0.6  PROT 7.4  ALBUMIN 2.5*   No results for input(s): LIPASE, AMYLASE in the last 168 hours. No results for input(s): AMMONIA in the last 168 hours. CBC:  Recent Labs Lab 08/28/15 1415 08/29/15 0340 08/30/15 0350 08/30/15 1355 08/31/15 0345 09/01/15 0508  WBC 15.5* 14.2* 10.8*  --  7.5 6.0  NEUTROABS 9.6*  --   --   --   --   --   HGB 10.0* 8.7* 8.3* 7.9* 8.0* 7.9*  HCT 31.7* 27.3* 26.6* 24.4* 25.0* 23.7*  MCV 88.1 88.3 89.3  --  87.7 87.1  PLT 226 195 205  --  198 184   Cardiac Enzymes:    Recent Labs Lab 08/28/15 2020  TROPONINI 0.05*   BNP (last 3 results)  Recent Labs  03/18/15 1240 08/29/15 1305  BNP 69.7 47.2    ProBNP (last 3 results) No results for input(s): PROBNP in the last 8760 hours.  CBG: No results for input(s): GLUCAP in the last 168 hours.  Recent Results (from the past 240 hour(s))  Blood Culture (routine x 2)     Status: None   Collection Time: 08/28/15  2:15 PM  Result Value Ref Range Status   Specimen Description RIGHT ANTECUBITAL  Final   Special Requests BOTTLES DRAWN AEROBIC AND ANAEROBIC 5CC  Final   Culture  Setup Time    Final    GRAM NEGATIVE RODS AEROBIC BOTTLE ONLY CRITICAL RESULT CALLED TO, READ BACK BY AND VERIFIED WITH: J. THIGPEN,RN AT 2542 ON [id number] BY Rhea Bleacher    Culture   Final    PROTEUS MIRABILIS Performed at [facility]    Report Status 09/01/2015 FINAL  Final   Organism ID, Bacteria PROTEUS MIRABILIS  Final      Susceptibility   Proteus mirabilis - MIC*    AMPICILLIN <=2 SENSITIVE Sensitive     CEFAZOLIN <=4 SENSITIVE Sensitive     CEFEPIME <=1 SENSITIVE Sensitive     CEFTAZIDIME <=1 SENSITIVE Sensitive     CEFTRIAXONE <=1 SENSITIVE Sensitive     CIPROFLOXACIN >=4 RESISTANT Resistant     GENTAMICIN <=1 SENSITIVE Sensitive     IMIPENEM 4 SENSITIVE Sensitive     TRIMETH/SULFA <=20 SENSITIVE  Sensitive     AMPICILLIN/SULBACTAM <=2 SENSITIVE Sensitive     PIP/TAZO <=4 SENSITIVE Sensitive     * PROTEUS MIRABILIS  Blood Culture (routine x 2)     Status: None (Preliminary result)   Collection Time: 08/28/15  2:20 PM  Result Value Ref Range Status   Specimen Description LEFT ANTECUBITAL  Final   Special Requests BOTTLES DRAWN AEROBIC AND ANAEROBIC 5CC  Final   Culture   Final    NO GROWTH 4 DAYS Performed at [facility]    Report Status PENDING  Incomplete  Urine culture     Status: None   Collection Time: 08/28/15  2:29 PM  Result Value Ref Range Status   Specimen Description URINE, CATHETERIZED  Final   Special Requests NONE  Final   Culture   Final    >=100,000 COLONIES/mL ESCHERICHIA COLI Performed at [facility]    Report Status 08/31/2015 FINAL  Final   Organism ID, Bacteria ESCHERICHIA COLI  Final      Susceptibility   Escherichia coli - MIC*    AMPICILLIN 8 SENSITIVE Sensitive     CEFAZOLIN <=4 SENSITIVE Sensitive     CEFTRIAXONE <=1 SENSITIVE Sensitive     CIPROFLOXACIN >=4 RESISTANT Resistant     GENTAMICIN <=1 SENSITIVE Sensitive     IMIPENEM <=0.25 SENSITIVE Sensitive     NITROFURANTOIN 128 RESISTANT Resistant     TRIMETH/SULFA <=20  SENSITIVE Sensitive     AMPICILLIN/SULBACTAM 4 SENSITIVE Sensitive     PIP/TAZO <=4 SENSITIVE Sensitive     * >=100,000 COLONIES/mL ESCHERICHIA COLI  MRSA PCR Screening     Status: None   Collection Time: 08/28/15  6:00 PM  Result Value Ref Range Status   MRSA by PCR NEGATIVE NEGATIVE Final    Comment:        The GeneXpert MRSA Assay (FDA approved for NASAL specimens only), is one component of a comprehensive MRSA colonization surveillance program. It is not intended to diagnose MRSA infection nor to guide or monitor treatment for MRSA infections.      Studies: No results found.  Scheduled Meds: . cefTAZidime (FORTAZ)  IV  1 g Intravenous 3 times per day  . collagenase   Topical BID  . lacosamide (VIMPAT) IV  100 mg Intravenous Q12H  . levETIRAcetam  1,500 mg Oral BID  . metoprolol  100 mg Oral BID  .  morphine injection  1 mg Intravenous Q4H  . potassium chloride  40 mEq Oral BID  . sodium chloride  3 mL Intravenous Q12H    Continuous Infusions: . sodium chloride 100 mL/hr at 09/01/15 1740     Time spent: 35 minutes  AKULA,VIJAYA  [facility] Pager [phone] If 7PM-7AM, please contact night-coverage at [email or web address], password [facility] 09/01/2015, 6:49 PM  LOS: 4 days

## 2015-09-02 DIAGNOSIS — R627 Adult failure to thrive: Secondary | ICD-10-CM

## 2015-09-02 LAB — CBC
HEMATOCRIT: 25.1 % — AB (ref 36.0–46.0)
HEMOGLOBIN: 8.2 g/dL — AB (ref 12.0–15.0)
MCH: 28.2 pg (ref 26.0–34.0)
MCHC: 32.7 g/dL (ref 30.0–36.0)
MCV: 86.3 fL (ref 78.0–100.0)
Platelets: 228 10*3/uL (ref 150–400)
RBC: 2.91 MIL/uL — AB (ref 3.87–5.11)
RDW: 15.8 % — ABNORMAL HIGH (ref 11.5–15.5)
WBC: 6 10*3/uL (ref 4.0–10.5)

## 2015-09-02 LAB — CULTURE, BLOOD (ROUTINE X 2): Culture: NO GROWTH

## 2015-09-02 MED ORDER — ENSURE ENLIVE PO LIQD
237.0000 mL | Freq: Two times a day (BID) | ORAL | Status: DC
  Administered 2015-09-02 – 2015-09-06 (×9): 237 mL via ORAL

## 2015-09-02 MED ORDER — POTASSIUM CHLORIDE CRYS ER 20 MEQ PO TBCR
40.0000 meq | EXTENDED_RELEASE_TABLET | Freq: Two times a day (BID) | ORAL | Status: AC
  Administered 2015-09-02 (×2): 40 meq via ORAL
  Filled 2015-09-02 (×2): qty 2

## 2015-09-02 NOTE — Progress Notes (Signed)
Initial Nutrition Assessment  DOCUMENTATION CODES:   Obesity unspecified  INTERVENTION:  Ensure Enlive po BID, each supplement provides 350 kcal and 20 grams of protein   NUTRITION DIAGNOSIS:   Inadequate oral intake related to dysphagia as evidenced by percent weight loss (chart review).  GOAL:   Patient will meet greater than or equal to 90% of their needs  MONITOR:   PO intake, Diet advancement, Labs, I & O's, Skin  REASON FOR ASSESSMENT:   Consult Assessment of nutrition requirement/status  ASSESSMENT:   [name] is a 49 yo female With past medical history of ruptured cerebral aneurysm which led to patient with seizure disorder, nonverbal, nonambulatory who resides in a nursing facility. Over the past week, she was noted to be less and less responsive and she was brought into the emergency room today. At that time her sodium was noted to be 158 (136 2-1/2 months ago), white count of 15 and during evaluation of patient and her chronic stage IV sacral decubitus ulcer, large amount of yellowish fluid felt to be pus drained out of this  Pt is unresponsive so I was unable to get a true diet history. Appears she had a brain aneurysm 08/2013; diagnosed with dysphagia upon admit 08/28/2015. Pt exhibits 20#/10% wt loss in 5 months, consistent with increased needs from her STG IV chronic pressure ulcer. It is also very possible that her dysphagia has gotten significantly worse in the past 5 months leading to this wt loss concurrent with pressure ulcer.   Nutrition-Focused physical exam completed. Findings are no fat depletion, no muscle depletion, and mild edema.   Will provide ensure enlive to meet increased calorie needs.    Diet Order:  DIET - DYS 1 Room service appropriate?: Yes; Fluid consistency:: Thin  Skin:  Wound (see comment) (Stg 4 pressure ulcer to sacrum.)  Last BM:  09/02/2015  Height:   Ht Readings from Last 1 Encounters:  08/28/15 5\' 7"  (1.702 m)     Weight:   Wt Readings from Last 1 Encounters:  08/28/15 176 lb 2.4 oz (79.9 kg)    Ideal Body Weight:  61.36 kg  BMI:  Body mass index is 27.58 kg/(m^2).  Estimated Nutritional Needs:   Kcal:  2400 - 2800  Protein:  80 - 100 grams  Fluid:  >/= 2.4L  EDUCATION NEEDS:   No education needs identified at this time  Dionne AnoWilliam M. Torrey Horseman, MS, RD LDN After Hours/Weekend Pager [phone]

## 2015-09-02 NOTE — Progress Notes (Signed)
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 On admission, sodium noted to be 158, white count of 15 and patient felt to be in sepsis secondary to a stage IV sacral decubitus ulcer with large amounts of pus drainage. Patient aggressively hydrated and given antibiotics and admitted to stepdown. Also noted to have urinary tract infection  Today, patient looks to be doing much better. Vital signs stable. White count slowly improving. She is more alert. Seen by general surgery who recommended CT for better determination of abscess. Blood cultures have grown out preliminary gram-negative rods. Awaiting further cultures. Nonverbal, but does shake her head yes when asked if she is having pain, had loose stools which are blood tinged.   Assessment/Plan: Active Problems:   HTN (hypertension): Holding antihypertensives , very well controlled.     Obesity: Patient meets criteria with BMI greater than 30    Seizures ([place]): Seizure medications changed to IV, if she continues to remain stable, changed to po today.     Hypernatremia: Patient has had this issue in the past. Likely secondary to dehydration from sepsis. Despite aggressive IV fluids, sodium did not change much overnight, will change to half-normal saline. Improving. Recheck in am.   Hypokalemia: replete as needed.     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 Responded well to aggressive IV fluids and antibiotics. Wound care consulted and recommendations given. She is getting hydrotherapy with PT. 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 No gluteal abscess. Mild proctitis.   Air mattress ordered.  Have ordered scheduled morphine since patient is unable to communicate pain. She was started on IV heparin overnight for the IV thrombus, but, she had blood tinged stools and we had to stop the IV heparin. Repeat h&h slightly dropped to 8 from 8.3. Requested IR consult for IVC filter and one was placed on 10/25. Improved sepsis. D/c vancomycin. Resume fortaZ, Blood cultures show proteus and urine cultures grew E COLI. Both are sensitive to fortaz and bactrim, probably switch to bactrim on discharge. Repeat blood cultures ordered.  Social worker to see if she can get hydrotherapy at SNF.    Anemia of blood loss and chronic disease: Baseline hemoglobin around 10, dropped to 8. Stool for occult blood is positive and we have stopped the IV heparin.  Anemia panel shows elevated ferritin.      UTI (lower urinary tract infection):  Urine cultures show  E coli. Will be covered by IV antibiotics    Palliative care encounter: Have asked palliative care for assistance. PLAN TO continue the same level of aggressive care.   Code Status: Full code as confirmed by patient's son  Family Communication: none at bedside, .   Disposition Plan: probably on Monday, when repeat blood  cultures are negative.     Consultants:  General surgery  Palliative care  IR  Procedures:  None  Antibiotics:  IV Fortaz 10/23-present  IV vancomycin 10/23-present 10/27   Objective: BP 126/98 mmHg  Pulse 82  Temp(Src) 98.6 F (37 C)  (Oral)  Resp 22  Ht _0  (1.702 m)  Wt 79.9 kg (176 lb 2.4 oz)  BMI 27.58 kg/m2  SpO2 100%  Intake/Output Summary (Last 24 hours) at 09/02/15 1841 Last data filed at 09/02/15 1438  Gross per 24 hour  Intake    440 ml  Output    700 ml  Net   -260 ml   Filed Weights   08/28/15 1820  Weight: 79.9 kg (176 lb 2.4 oz)    Exam:   General:  Nonverbal, no acute distress  Cardiovascular: Regular rhythm, mild tachycardia  Respiratory: Clear to auscultation bilaterally  Abdomen: Soft, few bowel sounds, nondistended  Musculoskeletal: Trace pitting edema   Data Reviewed: Basic Metabolic Panel:  Recent Labs Lab 08/28/15 1415 08/29/15 0340 08/30/15 0350 08/31/15 0345 09/01/15 0508  NA 158* 157* 153* 151* 147*  K 4.3 4.0 3.4* 3.4* 3.2*  CL >130* >130* 129* 127* 122*  CO2 19* 16* 18* 18* 18*  GLUCOSE 104* 103* 90 100* 93  BUN 47* 32* _1 CREATININE 1.04* 0.71 0.65 0.63 0.63  CALCIUM 9.4 8.6* 8.5* 8.6* 8.5*   Liver Function Tests:  Recent Labs Lab 08/28/15 1415  AST 16  ALT 11*  ALKPHOS 71  BILITOT 0.6  PROT 7.4  ALBUMIN 2.5*   No results for input(s): LIPASE, AMYLASE in the last 168 hours. No results for input(s): AMMONIA in the last 168 hours. CBC:  Recent Labs Lab 08/28/15 1415 08/29/15 0340 08/30/15 0350 08/30/15 1355 08/31/15 0345 09/01/15 0508 09/02/15 0645  WBC 15.5* 14.2* 10.8*  --  7.5 6.0 6.0  NEUTROABS 9.6*  --   --   --   --   --   --   HGB 10.0* 8.7* 8.3* 7.9* 8.0* 7.9* 8.2*  HCT 31.7* 27.3* 26.6* 24.4* 25.0* 23.7* 25.1*  MCV 88.1 88.3 89.3  --  87.7 87.1 86.3  PLT 226 195 205  --  198 184 228   Cardiac Enzymes:    Recent Labs Lab 08/28/15 2020  TROPONINI 0.05*   BNP (last 3 results)  Recent Labs  03/18/15 1240 08/29/15 1305  BNP 69.7 47.2    ProBNP (last 3 results) No results for input(s): PROBNP in the last 8760 hours.  CBG: No results for input(s): GLUCAP in the last 168 hours.  Recent Results (from the past  240 hour(s))  Blood Culture (routine x 2)     Status: None   Collection Time: 08/28/15  2:15 PM  Result Value Ref Range Status   Specimen Description RIGHT ANTECUBITAL  Final   Special Requests BOTTLES DRAWN AEROBIC AND ANAEROBIC 5CC  Final   Culture  Setup Time   Final    GRAM NEGATIVE RODS AEROBIC BOTTLE ONLY CRITICAL RESULT CALLED TO, READ BACK BY AND VERIFIED WITH: J. THIGPEN,RN AT [id number] ON [id number] BY Rhea Bleacher    Culture   Final    PROTEUS MIRABILIS Performed at [facility]    Report Status 09/01/2015 FINAL  Final   Organism ID, Bacteria PROTEUS MIRABILIS  Final      Susceptibility   Proteus mirabilis - MIC*    AMPICILLIN <=2 SENSITIVE Sensitive     CEFAZOLIN <=4 SENSITIVE Sensitive  CEFEPIME <=1 SENSITIVE Sensitive     CEFTAZIDIME <=1 SENSITIVE Sensitive     CEFTRIAXONE <=1 SENSITIVE Sensitive     CIPROFLOXACIN >=4 RESISTANT Resistant     GENTAMICIN <=1 SENSITIVE Sensitive     IMIPENEM 4 SENSITIVE Sensitive     TRIMETH/SULFA <=20 SENSITIVE Sensitive     AMPICILLIN/SULBACTAM <=2 SENSITIVE Sensitive     PIP/TAZO <=4 SENSITIVE Sensitive     * PROTEUS MIRABILIS  Blood Culture (routine x 2)     Status: None   Collection Time: 08/28/15  2:20 PM  Result Value Ref Range Status   Specimen Description LEFT ANTECUBITAL  Final   Special Requests BOTTLES DRAWN AEROBIC AND ANAEROBIC 5CC  Final   Culture   Final    NO GROWTH 5 DAYS Performed at [facility]    Report Status 09/02/2015 FINAL  Final  Urine culture     Status: None   Collection Time: 08/28/15  2:29 PM  Result Value Ref Range Status   Specimen Description URINE, CATHETERIZED  Final   Special Requests NONE  Final   Culture   Final    >=100,000 COLONIES/mL ESCHERICHIA COLI Performed at [facility]    Report Status 08/31/2015 FINAL  Final   Organism ID, Bacteria ESCHERICHIA COLI  Final      Susceptibility   Escherichia coli - MIC*    AMPICILLIN 8 SENSITIVE Sensitive     CEFAZOLIN <=4  SENSITIVE Sensitive     CEFTRIAXONE <=1 SENSITIVE Sensitive     CIPROFLOXACIN >=4 RESISTANT Resistant     GENTAMICIN <=1 SENSITIVE Sensitive     IMIPENEM <=0.25 SENSITIVE Sensitive     NITROFURANTOIN 128 RESISTANT Resistant     TRIMETH/SULFA <=20 SENSITIVE Sensitive     AMPICILLIN/SULBACTAM 4 SENSITIVE Sensitive     PIP/TAZO <=4 SENSITIVE Sensitive     * >=100,000 COLONIES/mL ESCHERICHIA COLI  MRSA PCR Screening     Status: None   Collection Time: 08/28/15  6:00 PM  Result Value Ref Range Status   MRSA by PCR NEGATIVE NEGATIVE Final    Comment:        The GeneXpert MRSA Assay (FDA approved for NASAL specimens only), is one component of a comprehensive MRSA colonization surveillance program. It is not intended to diagnose MRSA infection nor to guide or monitor treatment for MRSA infections.      Studies: No results found.  Scheduled Meds: . cefTAZidime (FORTAZ)  IV  1 g Intravenous 3 times per day  . collagenase   Topical BID  . feeding supplement (ENSURE ENLIVE)  237 mL Oral BID BM  . lacosamide (VIMPAT) IV  100 mg Intravenous Q12H  . levETIRAcetam  1,500 mg Oral BID  . metoprolol  100 mg Oral BID  .  morphine injection  1 mg Intravenous Q4H  . potassium chloride  40 mEq Oral BID  . sodium chloride  3 mL Intravenous Q12H    Continuous Infusions: . sodium chloride 50 mL/hr at 09/02/15 1600     Time spent: 325 minutes  [facility] Pager [phone] If 7PM-7AM, please contact night-coverage at [email or web address], password [facility] 09/02/2015, 6:41 PM  LOS: 5 days

## 2015-09-02 NOTE — Progress Notes (Signed)
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 Patient and Family Stated Goals: pt nonverbal Prior Treatments: bedside surgical debridement by MD  Pain Score: premedicated by RN, provided short rest breaks as needed per grimacing/grinding teeth  Wound Assessment                                                                                                                                    Wound / Incision (Open or Dehisced) 08/30/15 Other (Comment) Sacrum Mid Stage IV Pressure Sacral Pressure Ulcer (Active)  Dressing Type Moist to dry;Silicone dressing 26/37/8588  3:00 PM  Dressing Changed Changed 09/02/2015  3:00 PM  Dressing Status Old drainage 09/02/2015  3:00 PM  Dressing Change Frequency Twice a day 09/02/2015  3:00 PM  Site / Wound Assessment Brown;Red;Pink;Bleeding;Yellow 09/02/2015  3:00 PM  % Wound base Red or Granulating 25% 09/02/2015  3:00 PM  % Wound base Other (Comment) 75% 09/02/2015  3:00 PM  Peri-wound Assessment Intact 09/02/2015  3:00 PM  Wound Length (cm) 10 cm 09/02/2015  3:00 PM  Wound Width (cm) 10 cm 09/02/2015  3:00 PM  Wound Depth (cm) 3.5 cm 09/02/2015  3:00 PM  Tunneling (cm) 3 09/02/2015  3:00 PM  Margins Unattached edges (unapproximated) 09/02/2015  3:00 PM  Drainage Amount Copious 09/02/2015  3:00 PM  Drainage Description Purulent;Odor 09/02/2015  3:00 PM  Treatment Debridement (Selective);Hydrotherapy (Pulse lavage);Packing (Saline gauze) 09/02/2015  3:00 PM   Hydrotherapy Pulsed lavage therapy - wound location: sacral pressure ulcer Pulsed Lavage with Suction (psi): 12 psi Pulsed Lavage with Suction - Normal Saline Used: 1000  mL Pulsed Lavage Tip: Tip with splash shield Selective Debridement Selective Debridement - Location: eschar/slough of sacral pressure ulcer Selective Debridement - Tools Used: Forceps;Scissors Selective Debridement - Tissue Removed: eschar, slough   Wound Assessment and Plan  Wound Therapy - Assess/Plan/Recommendations Wound Therapy - Clinical Statement: Patient again was more alert, gritting teeth upon arrival. Debrided more nonviable tissue. Wound Therapy - Functional Problem List: medically complex pt, limited mobility Factors Delaying/Impairing Wound Healing: Immobility;Multiple medical problems;Incontinence Hydrotherapy Plan: Debridement;Dressing change;Patient/family education;Pulsatile lavage with suction Wound Therapy - Frequency: 6X / week Wound Therapy - Follow Up Recommendations: Skilled nursing facility Wound Plan: Perform pulsatile lavage, selective debridement and dressing changes to assist with wound healing and reducing bioburden.  10/26 seen with WOC and requested santyl   Wound Therapy Goals- Improve the function of patient's integumentary system by progressing the wound(s) through the phases of wound healing (inflammation - proliferation - remodeling) by: Decrease Necrotic Tissue to: 50% Decrease Necrotic Tissue - Progress: Progressing toward goal Increase Granulation Tissue to: 50% Increase Granulation Tissue - Progress: Progressing toward goal  Improve Drainage Characteristics: Mod Improve Drainage Characteristics - Progress: Progressing toward goal Goals/treatment plan/discharge plan were made with and agreed upon by patient/family: No, Patient unable to participate in goals/treatment/discharge plan and family unavailable Time For Goal Achievement: 2 weeks Wound Therapy - Potential for Goals: Fair  Goals will be updated until maximal potential achieved or discharge criteria met.  Discharge criteria: when goals achieved, discharge from hospital, MD decision/surgical  intervention, no progress towards goals, refusal/missing three consecutive treatments without notification or medical reason.  GP     [name],[name] 09/02/2015, 4:09 PM [name], PT, DPT 09/02/2015 Pager: [phone]

## 2015-09-03 DIAGNOSIS — A4151 Sepsis due to Escherichia coli [E. coli]: Secondary | ICD-10-CM

## 2015-09-03 LAB — CBC
HCT: 24 % — ABNORMAL LOW (ref 36.0–46.0)
Hemoglobin: 7.9 g/dL — ABNORMAL LOW (ref 12.0–15.0)
MCH: 28.2 pg (ref 26.0–34.0)
MCHC: 32.9 g/dL (ref 30.0–36.0)
MCV: 85.7 fL (ref 78.0–100.0)
PLATELETS: 299 10*3/uL (ref 150–400)
RBC: 2.8 MIL/uL — AB (ref 3.87–5.11)
RDW: 15.7 % — AB (ref 11.5–15.5)
WBC: 8.1 10*3/uL (ref 4.0–10.5)

## 2015-09-03 LAB — BASIC METABOLIC PANEL
ANION GAP: 7 (ref 5–15)
BUN: 8 mg/dL (ref 6–20)
CALCIUM: 8.7 mg/dL — AB (ref 8.9–10.3)
CO2: 19 mmol/L — ABNORMAL LOW (ref 22–32)
Chloride: 116 mmol/L — ABNORMAL HIGH (ref 101–111)
Creatinine, Ser: 0.55 mg/dL (ref 0.44–1.00)
GFR calc Af Amer: >60 mL/min (ref >60)
GLUCOSE: 99 mg/dL (ref 65–99)
POTASSIUM: 4.2 mmol/L (ref 3.5–5.1)
SODIUM: 142 mmol/L (ref 135–145)

## 2015-09-03 NOTE — Progress Notes (Signed)
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  Patient and Family Stated Goals pt nonverbal  Prior Treatments bedside surgical debridement by MD  Evaluation and Treatment  Evaluation and Treatment Procedures Explained to Patient/Family Yes (unsure of pt is able to comprehend)  Evaluation and Treatment Procedures Patient unable to consent due to mental status                  Wound / Incision (Open or Dehisced) 08/30/15 Other (Comment) Sacrum Mid Stage IV Pressure Sacral Pressure Ulcer  Date First Assessed/Time First Assessed: 08/30/15 1205   Wound Type: (c) Other (Comment)  Location: Sacrum  Location Orientation: Mid  Wound Description (Comments): Stage IV Pressure Sacral Pressure Ulcer  Present on Admission: Yes  Dressing Type Moist to dry (Allevyn)  Dressing Changed Changed  Dressing Status Old drainage;Intact  Dressing Change Frequency Twice a day  Site / Wound Assessment Brown;Red;Pink;Bleeding;Yellow  % Wound base Red or Granulating 25%  % Wound base Other (Comment) 75%  Peri-wound Assessment Intact (tan, brown slough; eschar)  Margins Unattached edges (unapproximated)  Drainage Amount Copious  Drainage Description Purulent  Treatment Hydrotherapy (Pulse lavage);Packing (Saline gauze)  Hydrotherapy  Pulsed lavage therapy - wound location sacral pressure ulcer  Pulsed Lavage with Suction (psi) 10-12 psi  Pulsed Lavage with Suction - Normal Saline Used 1000 mL  Pulsed Lavage Tip Tip with splash shield  Wound Therapy - Assess/Plan/Recommendations  Wound Therapy - Clinical Statement Patient again was more alert, spitting upon arrival and during session (mostly when being mobilized).   Wound Therapy - Functional Problem List medically complex pt, limited mobility  Factors Delaying/Impairing Wound Healing Immobility;Multiple  medical problems;Incontinence  Hydrotherapy Plan Pulsatile lavage with suction;Patient/family education  Wound Therapy - Frequency 6X / week  Wound Therapy - Follow Up Recommendations Skilled nursing facility  Wound Plan Perform pulsatile lavage, selective debridement and dressing changes to assist with wound healing and reducing bioburden.  10/26 seen with WOC and requested santyl   Wound Therapy Goals - Improve the function of patient's integumentary system by progressing the wound(s) through the phases of wound healing by:  Decrease Necrotic Tissue to 50%  Decrease Necrotic Tissue - Progress Progressing toward goal  Increase Granulation Tissue to 50%  Increase Granulation Tissue - Progress Progressing toward goal  Improve Drainage Characteristics Mod  Improve Drainage Characteristics - Progress Progressing toward goal  Goals/treatment plan/discharge plan were made with and agreed upon by patient/family No, Patient unable to participate in goals/treatment/discharge plan and family unavailable  Time For Goal Achievement 2 weeks  Wound Therapy - Potential for Goals Poor

## 2015-09-03 NOTE — Progress Notes (Signed)
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 On admission, sodium noted to be 158, white count of 15 and patient felt to be in sepsis secondary to a stage IV sacral decubitus ulcer with large amounts of pus drainage. Patient aggressively hydrated and given antibiotics and admitted to stepdown. Also noted to have urinary tract infection  Today, patient looks to be doing much better. Vital signs stable. White count slowly improving. She is more alert. Seen by general surgery who recommended CT for better determination of abscess. Blood cultures have grown out preliminary gram-negative rods. Awaiting further cultures. Nonverbal, but does shake her head yes when asked if she is having pain, had loose stools which are blood tinged.   Assessment/Plan: Active Problems:   HTN (hypertension): Holding antihypertensives , very well controlled.     Obesity: Patient meets criteria with BMI greater than 30    Seizures ([place]): Seizure medications changed to IV, if she continues to remain stable, changed to po  No seizure activity.     Hypernatremia: Patient has had this issue in the past. Likely secondary to dehydration from sepsis.resolved with hydration.   Hypokalemia: replete as needed.     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 Responded well to aggressive  IV fluids and antibiotics. Wound care consulted and recommendations given. She is getting hydrotherapy with PT. 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 No gluteal abscess. Mild proctitis.   Air mattress ordered.  Have ordered scheduled morphine since patient is unable to communicate pain. She was started on IV heparin overnight for the IV thrombus, but, she had blood tinged stools and we had to stop the IV heparin. Repeat h&h slightly dropped to 8 from 8.3. Requested IR consult for IVC filter and one was placed on 10/25. Improved sepsis. D/c vancomycin. Resume fortaZ, Blood cultures show proteus and urine cultures grew E COLI. Both are sensitive to fortaz and bactrim, probably switch to bactrim on discharge. Repeat blood cultures ordered.  Social worker to see if she can get hydrotherapy at SNF.    Anemia of blood loss and chronic disease: Baseline hemoglobin around 10, dropped to 8. Stool for occult blood is positive and we have stopped the IV heparin.  Anemia panel shows elevated ferritin. After stopping the IV heparin , the hemoglobin has stabilized.      UTI (lower urinary tract infection):  Urine cultures show  E coli. Will be covered by IV antibiotics.    Palliative care encounter: Have asked palliative care for assistance. PLAN TO continue the same level of aggressive care.   Code Status: Full code as confirmed by patient's son  Family Communication: none at bedside, .   Disposition Plan: probably on Monday, when repeat blood cultures are negative.  Consultants:  General surgery  Palliative care  IR  Procedures:  None  Antibiotics:  IV Fortaz 10/23-present  IV vancomycin 10/23-present 10/27   Objective: BP 135/98 mmHg  Pulse 103  Temp(Src) 97.5 F (36.4 C) (Oral)  Resp 20  Ht 5' 7"   (1.702 m)  Wt 79.9 kg (176 lb 2.4 oz)  BMI 27.58 kg/m2  SpO2 100%  Intake/Output Summary (Last 24 hours) at 09/03/15 1118 Last data filed at 09/03/15 0834  Gross per 24 hour  Intake    440 ml  Output   1500 ml  Net  -1060 ml   Filed Weights   08/28/15 1820  Weight: 79.9 kg (176 lb 2.4 oz)    Exam:   General:  Nonverbal, no acute distress  Cardiovascular: Regular rhythm, mild tachycardia  Respiratory: Clear to auscultation bilaterally  Abdomen: Soft, few bowel sounds, nondistended  Musculoskeletal: Trace pitting edema   Data Reviewed: Basic Metabolic Panel:  Recent Labs Lab 08/29/15 0340 08/30/15 0350 08/31/15 0345 09/01/15 0508 09/03/15 0615  NA 157* 153* 151* 147* 142  K 4.0 3.4* 3.4* 3.2* 4.2  CL >130* 129* 127* 122* 116*  CO2 16* 18* 18* 18* 19*  GLUCOSE 103* 90 100* 93 99  BUN 32* 19 16 14 8   CREATININE 0.71 0.65 0.63 0.63 0.55  CALCIUM 8.6* 8.5* 8.6* 8.5* 8.7*   Liver Function Tests:  Recent Labs Lab 08/28/15 1415  AST 16  ALT 11*  ALKPHOS 71  BILITOT 0.6  PROT 7.4  ALBUMIN 2.5*   No results for input(s): LIPASE, AMYLASE in the last 168 hours. No results for input(s): AMMONIA in the last 168 hours. CBC:  Recent Labs Lab 08/28/15 1415  08/30/15 0350 08/30/15 1355 08/31/15 0345 09/01/15 0508 09/02/15 0645 09/03/15 0615  WBC 15.5*  < > 10.8*  --  7.5 6.0 6.0 8.1  NEUTROABS 9.6*  --   --   --   --   --   --   --   HGB 10.0*  < > 8.3* 7.9* 8.0* 7.9* 8.2* 7.9*  HCT 31.7*  < > 26.6* 24.4* 25.0* 23.7* 25.1* 24.0*  MCV 88.1  < > 89.3  --  87.7 87.1 86.3 85.7  PLT 226  < > 205  --  198 184 228 299  < > = values in this interval not displayed. Cardiac Enzymes:    Recent Labs Lab 08/28/15 2020  TROPONINI 0.05*   BNP (last 3 results)  Recent Labs  03/18/15 1240 08/29/15 1305  BNP 69.7 47.2    ProBNP (last 3 results) No results for input(s): PROBNP in the last 8760 hours.  CBG: No results for input(s): GLUCAP in the last 168  hours.  Recent Results (from the past 240 hour(s))  Blood Culture (routine x 2)     Status: None   Collection Time: 08/28/15  2:15 PM  Result Value Ref Range Status   Specimen Description RIGHT ANTECUBITAL  Final   Special Requests BOTTLES DRAWN AEROBIC AND ANAEROBIC 5CC  Final   Culture  Setup Time   Final    GRAM NEGATIVE RODS AEROBIC BOTTLE ONLY CRITICAL RESULT CALLED TO, READ BACK BY AND VERIFIED WITH: J. THIGPEN,RN AT 1749 ON [id number] BY Rhea Bleacher    Culture   Final    PROTEUS MIRABILIS Performed at [facility]    Report Status 09/01/2015 FINAL  Final   Organism ID, Bacteria PROTEUS MIRABILIS  Final      Susceptibility   Proteus  mirabilis - MIC*    AMPICILLIN <=2 SENSITIVE Sensitive     CEFAZOLIN <=4 SENSITIVE Sensitive     CEFEPIME <=1 SENSITIVE Sensitive     CEFTAZIDIME <=1 SENSITIVE Sensitive     CEFTRIAXONE <=1 SENSITIVE Sensitive     CIPROFLOXACIN >=4 RESISTANT Resistant     GENTAMICIN <=1 SENSITIVE Sensitive     IMIPENEM 4 SENSITIVE Sensitive     TRIMETH/SULFA <=20 SENSITIVE Sensitive     AMPICILLIN/SULBACTAM <=2 SENSITIVE Sensitive     PIP/TAZO <=4 SENSITIVE Sensitive     * PROTEUS MIRABILIS  Blood Culture (routine x 2)     Status: None   Collection Time: 08/28/15  2:20 PM  Result Value Ref Range Status   Specimen Description LEFT ANTECUBITAL  Final   Special Requests BOTTLES DRAWN AEROBIC AND ANAEROBIC 5CC  Final   Culture   Final    NO GROWTH 5 DAYS Performed at [facility]    Report Status 09/02/2015 FINAL  Final  Urine culture     Status: None   Collection Time: 08/28/15  2:29 PM  Result Value Ref Range Status   Specimen Description URINE, CATHETERIZED  Final   Special Requests NONE  Final   Culture   Final    >=100,000 COLONIES/mL ESCHERICHIA COLI Performed at [facility]    Report Status 08/31/2015 FINAL  Final   Organism ID, Bacteria ESCHERICHIA COLI  Final      Susceptibility   Escherichia coli - MIC*    AMPICILLIN 8  SENSITIVE Sensitive     CEFAZOLIN <=4 SENSITIVE Sensitive     CEFTRIAXONE <=1 SENSITIVE Sensitive     CIPROFLOXACIN >=4 RESISTANT Resistant     GENTAMICIN <=1 SENSITIVE Sensitive     IMIPENEM <=0.25 SENSITIVE Sensitive     NITROFURANTOIN 128 RESISTANT Resistant     TRIMETH/SULFA <=20 SENSITIVE Sensitive     AMPICILLIN/SULBACTAM 4 SENSITIVE Sensitive     PIP/TAZO <=4 SENSITIVE Sensitive     * >=100,000 COLONIES/mL ESCHERICHIA COLI  MRSA PCR Screening     Status: None   Collection Time: 08/28/15  6:00 PM  Result Value Ref Range Status   MRSA by PCR NEGATIVE NEGATIVE Final    Comment:        The GeneXpert MRSA Assay (FDA approved for NASAL specimens only), is one component of a comprehensive MRSA colonization surveillance program. It is not intended to diagnose MRSA infection nor to guide or monitor treatment for MRSA infections.   Culture, blood (routine x 2)     Status: None (Preliminary result)   Collection Time: 09/02/15  8:50 PM  Result Value Ref Range Status   Specimen Description BLOOD LEFT HAND  Final   Special Requests IN PEDIATRIC BOTTLE 1ML  Final   Culture PENDING  Incomplete   Report Status PENDING  Incomplete  Culture, blood (routine x 2)     Status: None (Preliminary result)   Collection Time: 09/03/15 12:10 AM  Result Value Ref Range Status   Specimen Description BLOOD RIGHT FOOT  Final   Special Requests   Final    IN PEDIATRIC BOTTLE 3CC Performed at [facility]    Culture PENDING  Incomplete   Report Status PENDING  Incomplete     Studies: No results found.  Scheduled Meds: . cefTAZidime (FORTAZ)  IV  1 g Intravenous 3 times per day  . collagenase   Topical BID  . feeding supplement (ENSURE ENLIVE)  237 mL Oral BID BM  .  lacosamide (VIMPAT) IV  100 mg Intravenous Q12H  . levETIRAcetam  1,500 mg Oral BID  . metoprolol  100 mg Oral BID  .  morphine injection  1 mg Intravenous Q4H  . sodium chloride  3 mL Intravenous Q12H    Continuous  Infusions: . sodium chloride 50 mL/hr at 09/02/15 1600     Time spent: 25 minutes  [facility] Pager [phone] If 7PM-7AM, please contact night-coverage at [email or web address], password [facility] 09/03/2015, 11:18 AM  LOS: 6 days

## 2015-09-04 LAB — CBC
HEMATOCRIT: 25.3 % — AB (ref 36.0–46.0)
HEMOGLOBIN: 8.3 g/dL — AB (ref 12.0–15.0)
MCH: 28.1 pg (ref 26.0–34.0)
MCHC: 32.8 g/dL (ref 30.0–36.0)
MCV: 85.8 fL (ref 78.0–100.0)
PLATELETS: 283 10*3/uL (ref 150–400)
RBC: 2.95 MIL/uL — AB (ref 3.87–5.11)
RDW: 16.1 % — ABNORMAL HIGH (ref 11.5–15.5)
WBC: 7.5 10*3/uL (ref 4.0–10.5)

## 2015-09-04 NOTE — Progress Notes (Signed)
Pt drank 100% of Ensure and ate 75% of breakfast.

## 2015-09-04 NOTE — Progress Notes (Signed)
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 On admission, sodium noted to be 158, white count of 15 and patient felt to be in sepsis secondary to a stage IV sacral decubitus ulcer with large amounts of pus drainage. Patient aggressively hydrated and given antibiotics and admitted to stepdown. Also noted to have urinary tract infection  Today, patient looks to be doing much better. Vital signs stable. White count slowly improving. She is more alert. Seen by general surgery who recommended CT for better determination of abscess. Blood cultures grew proteusa nd repeat blood cultures ordered. Awaiting further cultures. Nonverbal, but does shake her head yes when asked if she is having pain, .  Assessment/Plan: Active Problems:   HTN (hypertension): Holding antihypertensives , very well controlled.     Obesity: Patient meets criteria with BMI greater than 30    Seizures ([place]): Seizure medications changed to IV, she continues to remain stable, changed to po  No seizure activity.     Hypernatremia: Patient has had this issue in the past. Likely secondary to dehydration from sepsis.resolved with hydration.   Hypokalemia: replete as needed.     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 Responded well to aggressive IV fluids and antibiotics. Wound care  consulted and recommendations given. She is getting hydrotherapy with PT. 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 No gluteal abscess. Mild proctitis.   Air mattress ordered.  Have ordered scheduled morphine since patient is unable to communicate pain. She was started on IV heparin overnight for the IV thrombus, but, she had blood tinged stools and we had to stop the IV heparin. Repeat h&h slightly dropped to 8 from 8.3. Requested IR consult for IVC filter and one was placed on 10/25. Improved sepsis. D/c vancomycin. Resume fortaZ, Blood cultures show proteus and urine cultures grew E COLI. Both are sensitive to fortaz and bactrim, probably switch to bactrim on discharge. Repeat blood cultures ordered.  Social worker to see if she can get hydrotherapy at SNF.    Anemia of blood loss and chronic disease: Baseline hemoglobin around 10, dropped to 8. Stool for occult blood is positive and we have stopped the IV heparin.  Anemia panel shows elevated ferritin. After stopping the IV heparin , the hemoglobin has stabilized.      UTI (lower urinary tract infection):  Urine cultures show  E coli. Will be covered by IV antibiotics.    Palliative care encounter: Have asked palliative care for assistance. PLAN TO continue the same level of aggressive care.   Code Status: Full code as confirmed by patient's son  Family Communication: none at bedside, .   Disposition Plan: probably on Monday, when repeat blood cultures are negative.     Consultants:  General surgery  Palliative care  IR  Procedures:  None  Antibiotics:  IV Fortaz 10/23-present  IV vancomycin 10/23-present 10/27   Objective: BP 145/103 mmHg  Pulse 94  Temp(Src) 98.4 F (36.9 C) (Axillary)  Resp 20  Ht 5' 7"  (1.702 m)  Wt 79.9 kg (176 lb 2.4 oz)   BMI 27.58 kg/m2  SpO2 99%  Intake/Output Summary (Last 24 hours) at 09/04/15 1627 Last data filed at 09/04/15 0555  Gross per 24 hour  Intake     80 ml  Output   1550 ml  Net  -1470 ml   Filed Weights   08/28/15 1820  Weight: 79.9 kg (176 lb 2.4 oz)    Exam:   General:  Nonverbal, no acute distress  Cardiovascular: Regular rhythm, mild tachycardia  Respiratory: Clear to auscultation bilaterally  Abdomen: Soft, few bowel sounds, nondistended  Musculoskeletal: Trace pitting edema   Data Reviewed: Basic Metabolic Panel:  Recent Labs Lab 08/29/15 0340 08/30/15 0350 08/31/15 0345 09/01/15 0508 09/03/15 0615  NA 157* 153* 151* 147* 142  K 4.0 3.4* 3.4* 3.2* 4.2  CL >130* 129* 127* 122* 116*  CO2 16* 18* 18* 18* 19*  GLUCOSE 103* 90 100* 93 99  BUN 32* 19 16 14 8   CREATININE 0.71 0.65 0.63 0.63 0.55  CALCIUM 8.6* 8.5* 8.6* 8.5* 8.7*   Liver Function Tests: No results for input(s): AST, ALT, ALKPHOS, BILITOT, PROT, ALBUMIN in the last 168 hours. No results for input(s): LIPASE, AMYLASE in the last 168 hours. No results for input(s): AMMONIA in the last 168 hours. CBC:  Recent Labs Lab 08/31/15 0345 09/01/15 0508 09/02/15 0645 09/03/15 0615 09/04/15 1417  WBC 7.5 6.0 6.0 8.1 7.5  HGB 8.0* 7.9* 8.2* 7.9* 8.3*  HCT 25.0* 23.7* 25.1* 24.0* 25.3*  MCV 87.7 87.1 86.3 85.7 85.8  PLT 198 184 228 299 283   Cardiac Enzymes:    Recent Labs Lab 08/28/15 2020  TROPONINI 0.05*   BNP (last 3 results)  Recent Labs  03/18/15 1240 08/29/15 1305  BNP 69.7 47.2    ProBNP (last 3 results) No results for input(s): PROBNP in the last 8760 hours.  CBG: No results for input(s): GLUCAP in the last 168 hours.  Recent Results (from the past 240 hour(s))  Blood Culture (routine x 2)     Status: None   Collection Time: 08/28/15  2:15 PM  Result Value Ref Range Status   Specimen Description RIGHT ANTECUBITAL  Final   Special Requests BOTTLES DRAWN AEROBIC AND  ANAEROBIC 5CC  Final   Culture  Setup Time   Final    GRAM NEGATIVE RODS AEROBIC BOTTLE ONLY CRITICAL RESULT CALLED TO, READ BACK BY AND VERIFIED WITH: J. THIGPEN,RN AT 1607 ON [id number] BY Rhea Bleacher    Culture   Final    PROTEUS MIRABILIS Performed at [facility]    Report Status 09/01/2015 FINAL  Final   Organism ID, Bacteria PROTEUS MIRABILIS  Final      Susceptibility   Proteus mirabilis - MIC*    AMPICILLIN <=2 SENSITIVE Sensitive     CEFAZOLIN <=4 SENSITIVE Sensitive     CEFEPIME <=1 SENSITIVE Sensitive     CEFTAZIDIME <=1 SENSITIVE Sensitive     CEFTRIAXONE <=1 SENSITIVE Sensitive     CIPROFLOXACIN >=4 RESISTANT Resistant     GENTAMICIN <=1 SENSITIVE Sensitive     IMIPENEM 4 SENSITIVE Sensitive     TRIMETH/SULFA <=20 SENSITIVE Sensitive     AMPICILLIN/SULBACTAM <=2 SENSITIVE Sensitive  PIP/TAZO <=4 SENSITIVE Sensitive     * PROTEUS MIRABILIS  Blood Culture (routine x 2)     Status: None   Collection Time: 08/28/15  2:20 PM  Result Value Ref Range Status   Specimen Description LEFT ANTECUBITAL  Final   Special Requests BOTTLES DRAWN AEROBIC AND ANAEROBIC 5CC  Final   Culture   Final    NO GROWTH 5 DAYS Performed at [facility]    Report Status 09/02/2015 FINAL  Final  Urine culture     Status: None   Collection Time: 08/28/15  2:29 PM  Result Value Ref Range Status   Specimen Description URINE, CATHETERIZED  Final   Special Requests NONE  Final   Culture   Final    >=100,000 COLONIES/mL ESCHERICHIA COLI Performed at [facility]    Report Status 08/31/2015 FINAL  Final   Organism ID, Bacteria ESCHERICHIA COLI  Final      Susceptibility   Escherichia coli - MIC*    AMPICILLIN 8 SENSITIVE Sensitive     CEFAZOLIN <=4 SENSITIVE Sensitive     CEFTRIAXONE <=1 SENSITIVE Sensitive     CIPROFLOXACIN >=4 RESISTANT Resistant     GENTAMICIN <=1 SENSITIVE Sensitive     IMIPENEM <=0.25 SENSITIVE Sensitive     NITROFURANTOIN 128 RESISTANT  Resistant     TRIMETH/SULFA <=20 SENSITIVE Sensitive     AMPICILLIN/SULBACTAM 4 SENSITIVE Sensitive     PIP/TAZO <=4 SENSITIVE Sensitive     * >=100,000 COLONIES/mL ESCHERICHIA COLI  MRSA PCR Screening     Status: None   Collection Time: 08/28/15  6:00 PM  Result Value Ref Range Status   MRSA by PCR NEGATIVE NEGATIVE Final    Comment:        The GeneXpert MRSA Assay (FDA approved for NASAL specimens only), is one component of a comprehensive MRSA colonization surveillance program. It is not intended to diagnose MRSA infection nor to guide or monitor treatment for MRSA infections.   Culture, blood (routine x 2)     Status: None (Preliminary result)   Collection Time: 09/02/15  8:50 PM  Result Value Ref Range Status   Specimen Description BLOOD LEFT HAND  Final   Special Requests IN PEDIATRIC BOTTLE 1ML  Final   Culture   Final    NO GROWTH 2 DAYS Performed at [facility]    Report Status PENDING  Incomplete  Culture, blood (routine x 2)     Status: None (Preliminary result)   Collection Time: 09/03/15 12:10 AM  Result Value Ref Range Status   Specimen Description BLOOD RIGHT FOOT  Final   Special Requests IN PEDIATRIC BOTTLE 3CC  Final   Culture   Final    NO GROWTH < 24 HOURS Performed at [facility]    Report Status PENDING  Incomplete     Studies: No results found.  Scheduled Meds: . cefTAZidime (FORTAZ)  IV  1 g Intravenous 3 times per day  . collagenase   Topical BID  . feeding supplement (ENSURE ENLIVE)  237 mL Oral BID BM  . lacosamide (VIMPAT) IV  100 mg Intravenous Q12H  . levETIRAcetam  1,500 mg Oral BID  . metoprolol  100 mg Oral BID  .  morphine injection  1 mg Intravenous Q4H  . sodium chloride  3 mL Intravenous Q12H    Continuous Infusions: . sodium chloride 50 mL/hr at 09/04/15 0945     Time spent: 15 minutes  [facility]  Pager [phone] If 7PM-7AM, please contact night-coverage at [email or web address],  password [facility] 09/04/2015, 4:27 PM  LOS: 7 days

## 2015-09-04 NOTE — Progress Notes (Signed)
ANTIBIOTIC CONSULT NOTE - FOLLOW UP  Pharmacy Consult for Ceftazidime Indication: rule out sepsis  Allergies  Allergen Reactions  . Amlodipine Swelling  . Penicillins Swelling  . Latex Rash    Unknown   . Other Other (See Comments)    Natural Rubber- Unknown     Patient Measurements: Height:  (170.2 cm) Weight: 176 lb 2.4 oz (79.9 kg) IBW/kg (Calculated) : 61.6  Vital Signs: Temp: 97.7 F (36.5 C) (10/30 0543) Temp Source: Axillary (10/30 0543) BP: 154/86 mmHg (10/30 0543) Pulse Rate: 85 (10/30 0543) Intake/Output from previous day: 10/29 0701 - 10/30 0700 In: 120 [P.O.:120] Out: 1550 [Urine:1350; Stool:200] Intake/Output from this shift:    Labs:  Recent Labs  09/02/15 0645 09/03/15 0615  WBC 6.0 8.1  HGB 8.2* 7.9*  PLT 228 299  CREATININE  --  0.55   Estimated Creatinine Clearance: 93.5 mL/min (by C-G formula based on Cr of 0.55). No results for input(s): VANCOTROUGH, VANCOPEAK, VANCORANDOM, GENTTROUGH, GENTPEAK, GENTRANDOM, TOBRATROUGH, TOBRAPEAK, TOBRARND, AMIKACINPEAK, AMIKACINTROU, AMIKACIN in the last 72 hours.   Microbiology: Recent Results (from the past 720 hour(s))  Blood Culture (routine x 2)     Status: None   Collection Time: 08/28/15  2:15 PM  Result Value Ref Range Status   Specimen Description RIGHT ANTECUBITAL  Final   Special Requests BOTTLES DRAWN AEROBIC AND ANAEROBIC 5CC  Final   Culture  Setup Time   Final    GRAM NEGATIVE RODS AEROBIC BOTTLE ONLY CRITICAL RESULT CALLED TO, READ BACK BY AND VERIFIED WITH: J. THIGPEN,RN AT 1123 ON [id number] BY Lucienne Capers    Culture   Final    PROTEUS MIRABILIS Performed at [facility]    Report Status 09/01/2015 FINAL  Final   Organism ID, Bacteria PROTEUS MIRABILIS  Final      Susceptibility   Proteus mirabilis - MIC*    AMPICILLIN <=2 SENSITIVE Sensitive     CEFAZOLIN <=4 SENSITIVE Sensitive     CEFEPIME <=1 SENSITIVE Sensitive     CEFTAZIDIME <=1 SENSITIVE Sensitive      CEFTRIAXONE <=1 SENSITIVE Sensitive     CIPROFLOXACIN >=4 RESISTANT Resistant     GENTAMICIN <=1 SENSITIVE Sensitive     IMIPENEM 4 SENSITIVE Sensitive     TRIMETH/SULFA <=20 SENSITIVE Sensitive     AMPICILLIN/SULBACTAM <=2 SENSITIVE Sensitive     PIP/TAZO <=4 SENSITIVE Sensitive     * PROTEUS MIRABILIS  Blood Culture (routine x 2)     Status: None   Collection Time: 08/28/15  2:20 PM  Result Value Ref Range Status   Specimen Description LEFT ANTECUBITAL  Final   Special Requests BOTTLES DRAWN AEROBIC AND ANAEROBIC 5CC  Final   Culture   Final    NO GROWTH 5 DAYS Performed at [facility]    Report Status 09/02/2015 FINAL  Final  Urine culture     Status: None   Collection Time: 08/28/15  2:29 PM  Result Value Ref Range Status   Specimen Description URINE, CATHETERIZED  Final   Special Requests NONE  Final   Culture   Final    >=100,000 COLONIES/mL ESCHERICHIA COLI Performed at [facility]    Report Status 08/31/2015 FINAL  Final   Organism ID, Bacteria ESCHERICHIA COLI  Final      Susceptibility   Escherichia coli - MIC*    AMPICILLIN 8 SENSITIVE Sensitive     CEFAZOLIN <=4 SENSITIVE Sensitive     CEFTRIAXONE <=1 SENSITIVE Sensitive  CIPROFLOXACIN >=4 RESISTANT Resistant     GENTAMICIN <=1 SENSITIVE Sensitive     IMIPENEM <=0.25 SENSITIVE Sensitive     NITROFURANTOIN 128 RESISTANT Resistant     TRIMETH/SULFA <=20 SENSITIVE Sensitive     AMPICILLIN/SULBACTAM 4 SENSITIVE Sensitive     PIP/TAZO <=4 SENSITIVE Sensitive     * >=100,000 COLONIES/mL ESCHERICHIA COLI  MRSA PCR Screening     Status: None   Collection Time: 08/28/15  6:00 PM  Result Value Ref Range Status   MRSA by PCR NEGATIVE NEGATIVE Final    Comment:        The GeneXpert MRSA Assay (FDA approved for NASAL specimens only), is one component of a comprehensive MRSA colonization surveillance program. It is not intended to diagnose MRSA infection nor to guide or monitor treatment  for MRSA infections.   Culture, blood (routine x 2)     Status: None (Preliminary result)   Collection Time: 09/02/15  8:50 PM  Result Value Ref Range Status   Specimen Description BLOOD LEFT HAND  Final   Special Requests IN PEDIATRIC BOTTLE  Final   Culture   Final    NO GROWTH 2 DAYS Performed at [facility]    Report Status PENDING  Incomplete  Culture, blood (routine x 2)     Status: None (Preliminary result)   Collection Time: 09/03/15 12:10 AM  Result Value Ref Range Status   Specimen Description BLOOD RIGHT FOOT  Final   Special Requests IN PEDIATRIC BOTTLE 3CC  Final   Culture   Final    NO GROWTH < 24 HOURS Performed at [facility]    Report Status PENDING  Incomplete    Anti-infectives    Start     Dose/Rate Route Frequency Ordered Stop   08/28/15 2200  cefTAZidime (FORTAZ) 1 g in dextrose 5 % 50 mL IVPB     1 g 100 mL/hr over 30 Minutes Intravenous 3 times per day 08/28/15 1439     08/28/15 2000  vancomycin (VANCOCIN) IVPB 1000 mg/200 mL premix  Status:  Discontinued     1,000 mg 200 mL/hr over 60 Minutes Intravenous Every 12 hours 08/28/15 1441 08/31/15 2131   08/28/15 1430  cefTAZidime (FORTAZ) 2 g in dextrose 5 % 50 mL IVPB     2 g 100 mL/hr over 30 Minutes Intravenous  Once 08/28/15 1415 08/28/15 1456   08/28/15 1415  vancomycin (VANCOCIN) IVPB 1000 mg/200 mL premix     1,000 mg 200 mL/hr over 60 Minutes Intravenous  Once 08/28/15 1408 08/28/15 1547      Assessment: 49 yoF to ED from SNF for AMS, leukocytosis, tachycardia. Hx of CVA with L hemiparesis. PCN allergy noted with rx of " swelling", has tolerated Cefepime previously. Begin Vancomycin and Ceftazidime, pharmacy to dose.  Narrowed to ceftazidime on 10/27.    10/30:  - Afebrile, WBC, Scr stable-- non ambulatory(crcl~93) - 10/24 CT pelvis shows no residual abscess (no mention of osteo presence or absence)  10/23 >> Vanc >> 10/27 10/23 >> Ceftaz >>   10/23 MRSA PCR:  neg 10/23 blood: 1/2 Proteus - resistant only to Cipro 10/23 urine: > 100k E coli - resistant only to Cipro and nitro 10/25 GI path panel: IP No prior positive cultures in Epic  Dose changes/levels:  10/26 2030 VT = 35 on 1g q12h (8AM dose given 1 hr late) 10/27 0500 VT = 27. No doses charted in the interim.  Goal of Therapy:  Eradication  of infection Appropriate antibiotic dosing for indication and renal function  Plan:  Day 8 antibiotics  Continue Ceftaz 1g q8h. Consider narrowing to Ancef or Rocephin.   Follow clinical course, renal function, culture results as available.  Follow for de-escalation of antibiotics and LOT.  Haynes Hoehnolleen Bland Rudzinski, PharmD, BCPS 09/04/2015, 1:59 PM  Pager: [phone]

## 2015-09-05 ENCOUNTER — Inpatient Hospital Stay (HOSPITAL_COMMUNITY): Payer: 59

## 2015-09-05 DIAGNOSIS — Z515 Encounter for palliative care: Secondary | ICD-10-CM

## 2015-09-05 LAB — CBC
HCT: 23 % — ABNORMAL LOW (ref 36.0–46.0)
HEMOGLOBIN: 7.7 g/dL — AB (ref 12.0–15.0)
MCH: 28 pg (ref 26.0–34.0)
MCHC: 33.5 g/dL (ref 30.0–36.0)
MCV: 83.6 fL (ref 78.0–100.0)
Platelets: 274 10*3/uL (ref 150–400)
RBC: 2.75 MIL/uL — ABNORMAL LOW (ref 3.87–5.11)
RDW: 16.1 % — ABNORMAL HIGH (ref 11.5–15.5)
WBC: 7.1 10*3/uL (ref 4.0–10.5)

## 2015-09-05 MED ORDER — CEPHALEXIN 500 MG PO CAPS
500.0000 mg | ORAL_CAPSULE | Freq: Two times a day (BID) | ORAL | Status: DC
  Administered 2015-09-05 – 2015-09-06 (×2): 500 mg via ORAL
  Filled 2015-09-05 (×4): qty 1

## 2015-09-05 NOTE — Progress Notes (Signed)
Physical Therapy Hydrotherapy Treatment Note    09/05/15 1500  Subjective Assessment  Subjective Pt is a 49 year old female admitted for sepsis secondary to sacral abscess/stage IV decubitus ulcer of sacral region  Patient and Family Stated Goals pt nonverbal  Prior Treatments bedside surgical debridement by MD  Evaluation and Treatment  Evaluation and Treatment Procedures Explained to Patient/Family Yes  Evaluation and Treatment Procedures Patient unable to consent due to mental status  Wound / Incision (Open or Dehisced) 08/30/15 Other (Comment) Sacrum Mid Stage IV Pressure Sacral Pressure Ulcer  Date First Assessed/Time First Assessed: 08/30/15 1205   Wound Type: (c) Other (Comment)  Location: Sacrum  Location Orientation: Mid  Wound Description (Comments): Stage IV Pressure Sacral Pressure Ulcer  Present on Admission: Yes  Dressing Type Moist to dry;Silicone dressing  Dressing Changed Changed  Dressing Status Old drainage  Dressing Change Frequency Twice a day  Site / Wound Assessment Brown;Red;Pink;Bleeding;Yellow  % Wound base Red or Granulating 40%  % Wound base Other (Comment) 60% (yellow/tan/brown slough/eschar)  Peri-wound Assessment Intact  Margins Unattached edges (unapproximated)  Drainage Amount Copious  Drainage Description Purulent;Odor  Treatment Debridement (Selective);Hydrotherapy (Pulse lavage);Packing (Saline gauze)  Hydrotherapy  Pulsed lavage therapy - wound location sacral pressure ulcer  Pulsed Lavage with Suction (psi) 12 psi  Pulsed Lavage with Suction - Normal Saline Used 1000 mL  Pulsed Lavage Tip Tip with splash shield  Selective Debridement  Selective Debridement - Location eschar/slough of sacral pressure ulcer  Selective Debridement - Tools Used Forceps;Scissors  Selective Debridement - Tissue Removed slough  Wound Therapy - Assess/Plan/Recommendations  Wound Therapy - Clinical Statement Pt leaking liquid BM around flexiseal, RN into room to  address.  Will, PA also into room to assess wound.  Performed hydrotherapy today and selective debridement to remove nonviable tissue and reduce bioburden.  Wound Therapy - Functional Problem List medically complex pt, limited mobility  Factors Delaying/Impairing Wound Healing Immobility;Multiple medical problems;Incontinence  Hydrotherapy Plan Debridement;Dressing change;Patient/family education;Pulsatile lavage with suction  Wound Therapy - Frequency 6X / week  Wound Therapy - Follow Up Recommendations Skilled nursing facility  Wound Plan Perform pulsatile lavage, selective debridement and dressing changes to assist with wound healing and reducing bioburden.  10/26 seen with WOC and requested santyl   Wound Therapy Goals - Improve the function of patient's integumentary system by progressing the wound(s) through the phases of wound healing by:  Decrease Necrotic Tissue to 50%  Decrease Necrotic Tissue - Progress Progressing toward goal  Increase Granulation Tissue to 50%  Increase Granulation Tissue - Progress Progressing toward goal  Improve Drainage Characteristics Mod  Improve Drainage Characteristics - Progress Progressing toward goal  Goals/treatment plan/discharge plan were made with and agreed upon by patient/family No, Patient unable to participate in goals/treatment/discharge plan and family unavailable  Time For Goal Achievement 2 weeks  Wound Therapy - Potential for Goals Fair   Pt premedicated for session. Time: [id number]  Zenovia JarredKati Abuk Selleck, PT, DPT 09/05/2015 Pager: [phone]

## 2015-09-05 NOTE — Plan of Care (Signed)
Problem: Phase III Progression Outcomes Goal: Voiding independently Outcome: Not Applicable Date Met:  24/81/85 Foley in place due to stage IV decubitis

## 2015-09-05 NOTE — Progress Notes (Signed)
  Subjective: She is leaking around the flexiseal, wound is stable, she hates dressing.  No response when you talk to her, but she knows when we turn her what we are doing.  Objective: Vital signs in last 24 hours: Temp:  [98.4 F (36.9 C)-100.6 F (38.1 C)] 100.6 F (38.1 C) (10/30 2100) Pulse Rate:  [94-109] 109 (10/30 2100) Resp:  [20] 20 (10/30 2100) BP: (145-148)/(97-103) 148/97 mmHg (10/30 2100) SpO2:  [97 %-99 %] 97 % (10/30 2100) Last BM Date: 09/02/15 300 PO recorded TM 100.6 last PM, nothing recorded since then Labs stable  Intake/Output from previous day: 10/30 0701 - 10/31 0700 In: 300 [P.O.:300] Out: 650 [Urine:650] Intake/Output this shift:    General appearance: alert, cooperative and no distress Skin: Wound ok still has allot of white, non viable tissue at the base, slowly being cleaned up with hydrol  Wound last Week 08/30/15   09/02/15   09/05/15  She is on her left side in these pictures  Lab Results:   Recent Labs  09/04/15 1417 09/05/15 0545  WBC 7.5 7.1  HGB 8.3* 7.7*  HCT 25.3* 23.0*  PLT 283 274    BMET  Recent Labs  09/03/15 0615  NA 142  K 4.2  CL 116*  CO2 19*  GLUCOSE 99  BUN 8  CREATININE 0.55  CALCIUM 8.7*   PT/INR No results for input(s): LABPROT, INR in the last 72 hours.  No results for input(s): AST, ALT, ALKPHOS, BILITOT, PROT, ALBUMIN in the last 168 hours.   Lipase  No results found for: LIPASE   Studies/Results: No results found.  Medications: . cefTAZidime (FORTAZ)  IV  1 g Intravenous 3 times per day  . collagenase   Topical BID  . feeding supplement (ENSURE ENLIVE)  237 mL Oral BID BM  . lacosamide (VIMPAT) IV  100 mg Intravenous Q12H  . levETIRAcetam  1,500 mg Oral BID  . metoprolol  100 mg Oral BID  .  morphine injection  1 mg Intravenous Q4H  . 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 Antibiotics: Ceftazidime day 8/Vancomycin day 4 completed 08/31/15. DVT: IVC filter placed 08/30/15  Plan:  Continue hydrotherapy, low pressure and collagenase treatments.      LOS: 8 days    Bethzy Hauck 09/05/2015

## 2015-09-05 NOTE — Progress Notes (Signed)
Per Eunice Bl[facility]ebbie at [facility] [place], facility is unable to care for patient needing hydrotherapy.  Per Tresa EndoKelly at [facility], facility equipment for hydrotherapy is on back order and unfortunately will not be able to meet patient's needs.  CSW attempted to get in contact with [facility] but received no answer.  CSW [facility] with [facility] and [facility], which are facilities that provide hydrotherapy.  Both facilities are reviewing patient information and will be following up with CSW on tomorrow.   Fernande BoydenJoyce Annayah Worthley, [facility] Clinical Social Worker [facility] [phone]

## 2015-09-05 NOTE — Progress Notes (Signed)
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 On admission, sodium noted to be 158, white count of 15 and patient felt to be in sepsis secondary to a stage IV sacral decubitus ulcer with large amounts of pus drainage. Patient aggressively hydrated and given antibiotics and admitted to stepdown. Also noted to have urinary tract infection  Today, patient looks to be doing much better. Vital signs stable. White count slowly improving. She is more alert. Seen by general surgery who recommended CT for better determination of abscess. Blood cultures grew proteusa nd repeat blood cultures ordered. Awaiting further cultures. Nonverbal, but does shake her head yes when asked if she is having pain, .  Assessment/Plan: Active Problems:   HTN (hypertension): Holding antihypertensives , very well controlled.     Obesity: Patient meets criteria with BMI greater than 30    Seizures ([place]): Seizure medications changed to IV, she continues to remain stable, changed to po  No seizure activity.     Hypernatremia: Patient has had this issue in the past. Likely secondary to dehydration from sepsis.resolved with hydration.   Hypokalemia: replete as needed.     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 Responded well to aggressive IV fluids and antibiotics. Wound care  consulted and recommendations given. She is getting hydrotherapy with PT. 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 No gluteal abscess. Mild proctitis.   Air mattress ordered.  Have ordered scheduled morphine since patient is unable to communicate pain. She was started on IV heparin overnight for the IV thrombus, but, she had blood tinged stools and we had to stop the IV heparin. Repeat h&h slightly dropped to 8 from 8.3. Requested IR consult for IVC filter and one was placed on 10/25. Improved sepsis. D/c vancomycin. Resume fortaZ, Blood cultures show proteus and urine cultures grew E COLI. Both are sensitive to fortaz and bactrim, and keflex,  Repeat blood cultures ordered and are negative.  Social worker to see if she can get hydrotherapy at SNF.   Recurrent fever today: Unclear etiology, repeat cultures negative.  CXR does not show any pneumonia.    Anemia of blood loss and chronic disease: Baseline hemoglobin around 10, dropped to 8. Stool for occult blood is positive and we have stopped the IV heparin.  Anemia panel shows elevated ferritin. After stopping the IV heparin , the hemoglobin has stabilized.      UTI (lower urinary tract infection):  Urine cultures show  E coli. Completed 8 days of antibiotics.    Palliative care encounter: Have asked palliative care for assistance. PLAN TO continue the same level of aggressive care.   Code Status: Full code as confirmed by patient's son  Family Communication: none at bedside, .   Disposition Plan: probably  in am, when repeat blood cultures are negative.     Consultants:  General surgery  Palliative care  IR  Procedures:  None  Antibiotics:  IV Tressie Ellis 10/23- 10/31  IV vancomycin 10/23-present 10/27  Keflex on 11/1   Objective: BP 148/97 mmHg  Pulse  109  Temp(Src) 100.6 F (38.1 C) (Axillary)  Resp 20  Ht 5' 7"  (1.702 m)  Wt 79.9 kg (176 lb 2.4 oz)  BMI 27.58 kg/m2  SpO2 97%  Intake/Output Summary (Last 24 hours) at 09/05/15 1120 Last data filed at 09/05/15 0825  Gross per 24 hour  Intake    200 ml  Output    650 ml  Net   -450 ml   Filed Weights   08/28/15 1820  Weight: 79.9 kg (176 lb 2.4 oz)    Exam:   General:  Nonverbal, no acute distress  Cardiovascular: Regular rhythm, mild tachycardia  Respiratory: Clear to auscultation bilaterally  Abdomen: Soft, few bowel sounds, nondistended  Musculoskeletal: Trace pitting edema   Data Reviewed: Basic Metabolic Panel:  Recent Labs Lab 08/30/15 0350 08/31/15 0345 09/01/15 0508 09/03/15 0615  NA 153* 151* 147* 142  K 3.4* 3.4* 3.2* 4.2  CL 129* 127* 122* 116*  CO2 18* 18* 18* 19*  GLUCOSE 90 100* 93 99  BUN 19 16 14 8   CREATININE 0.65 0.63 0.63 0.55  CALCIUM 8.5* 8.6* 8.5* 8.7*   Liver Function Tests: No results for input(s): AST, ALT, ALKPHOS, BILITOT, PROT, ALBUMIN in the last 168 hours. No results for input(s): LIPASE, AMYLASE in the last 168 hours. No results for input(s): AMMONIA in the last 168 hours. CBC:  Recent Labs Lab 09/01/15 0508 09/02/15 0645 09/03/15 0615 09/04/15 1417 09/05/15 0545  WBC 6.0 6.0 8.1 7.5 7.1  HGB 7.9* 8.2* 7.9* 8.3* 7.7*  HCT 23.7* 25.1* 24.0* 25.3* 23.0*  MCV 87.1 86.3 85.7 85.8 83.6  PLT 184 228 299 283 274   Cardiac Enzymes:   No results for input(s): CKTOTAL, CKMB, CKMBINDEX, TROPONINI in the last 168 hours. BNP (last 3 results)  Recent Labs  03/18/15 1240 08/29/15 1305  BNP 69.7 47.2    ProBNP (last 3 results) No results for input(s): PROBNP in the last 8760 hours.  CBG: No results for input(s): GLUCAP in the last 168 hours.  Recent Results (from the past 240 hour(s))  Blood Culture (routine x 2)     Status: None   Collection Time: 08/28/15  2:15 PM  Result Value Ref Range Status   Specimen  Description RIGHT ANTECUBITAL  Final   Special Requests BOTTLES DRAWN AEROBIC AND ANAEROBIC 5CC  Final   Culture  Setup Time   Final    GRAM NEGATIVE RODS AEROBIC BOTTLE ONLY CRITICAL RESULT CALLED TO, READ BACK BY AND VERIFIED WITH: J. THIGPEN,RN AT [id number] ON [id number] BY Rhea Bleacher    Culture   Final    PROTEUS MIRABILIS Performed at [facility]    Report Status 09/01/2015 FINAL  Final   Organism ID, Bacteria PROTEUS MIRABILIS  Final      Susceptibility   Proteus mirabilis - MIC*    AMPICILLIN <=2 SENSITIVE Sensitive     CEFAZOLIN <=4 SENSITIVE Sensitive     CEFEPIME <=1 SENSITIVE Sensitive     CEFTAZIDIME <=1 SENSITIVE Sensitive     CEFTRIAXONE <=1 SENSITIVE Sensitive     CIPROFLOXACIN >=4 RESISTANT Resistant     GENTAMICIN <=1 SENSITIVE Sensitive     IMIPENEM 4 SENSITIVE Sensitive  TRIMETH/SULFA <=20 SENSITIVE Sensitive     AMPICILLIN/SULBACTAM <=2 SENSITIVE Sensitive     PIP/TAZO <=4 SENSITIVE Sensitive     * PROTEUS MIRABILIS  Blood Culture (routine x 2)     Status: None   Collection Time: 08/28/15  2:20 PM  Result Value Ref Range Status   Specimen Description LEFT ANTECUBITAL  Final   Special Requests BOTTLES DRAWN AEROBIC AND ANAEROBIC 5CC  Final   Culture   Final    NO GROWTH 5 DAYS Performed at [facility]    Report Status 09/02/2015 FINAL  Final  Urine culture     Status: None   Collection Time: 08/28/15  2:29 PM  Result Value Ref Range Status   Specimen Description URINE, CATHETERIZED  Final   Special Requests NONE  Final   Culture   Final    >=100,000 COLONIES/mL ESCHERICHIA COLI Performed at [facility]    Report Status 08/31/2015 FINAL  Final   Organism ID, Bacteria ESCHERICHIA COLI  Final      Susceptibility   Escherichia coli - MIC*    AMPICILLIN 8 SENSITIVE Sensitive     CEFAZOLIN <=4 SENSITIVE Sensitive     CEFTRIAXONE <=1 SENSITIVE Sensitive     CIPROFLOXACIN >=4 RESISTANT Resistant     GENTAMICIN <=1 SENSITIVE  Sensitive     IMIPENEM <=0.25 SENSITIVE Sensitive     NITROFURANTOIN 128 RESISTANT Resistant     TRIMETH/SULFA <=20 SENSITIVE Sensitive     AMPICILLIN/SULBACTAM 4 SENSITIVE Sensitive     PIP/TAZO <=4 SENSITIVE Sensitive     * >=100,000 COLONIES/mL ESCHERICHIA COLI  MRSA PCR Screening     Status: None   Collection Time: 08/28/15  6:00 PM  Result Value Ref Range Status   MRSA by PCR NEGATIVE NEGATIVE Final    Comment:        The GeneXpert MRSA Assay (FDA approved for NASAL specimens only), is one component of a comprehensive MRSA colonization surveillance program. It is not intended to diagnose MRSA infection nor to guide or monitor treatment for MRSA infections.   Culture, blood (routine x 2)     Status: None (Preliminary result)   Collection Time: 09/02/15  8:50 PM  Result Value Ref Range Status   Specimen Description BLOOD LEFT HAND  Final   Special Requests IN PEDIATRIC BOTTLE 1ML  Final   Culture   Final    NO GROWTH 3 DAYS Performed at [facility]    Report Status PENDING  Incomplete  Culture, blood (routine x 2)     Status: None (Preliminary result)   Collection Time: 09/03/15 12:10 AM  Result Value Ref Range Status   Specimen Description BLOOD RIGHT FOOT  Final   Special Requests IN PEDIATRIC BOTTLE 3CC  Final   Culture   Final    NO GROWTH 2 DAYS Performed at [facility]    Report Status PENDING  Incomplete     Studies: No results found.  Scheduled Meds: . cefTAZidime (FORTAZ)  IV  1 g Intravenous 3 times per day  . collagenase   Topical BID  . feeding supplement (ENSURE ENLIVE)  237 mL Oral BID BM  . lacosamide (VIMPAT) IV  100 mg Intravenous Q12H  . levETIRAcetam  1,500 mg Oral BID  . metoprolol  100 mg Oral BID  .  morphine injection  1 mg Intravenous Q4H  . sodium chloride  3 mL Intravenous Q12H    Continuous Infusions: . sodium chloride 50 mL/hr at  09/04/15 0945     Time spent: 15 minutes  [place]  [facility] Pager [phone] If 7PM-7AM, please contact night-coverage at [email or web address], password [facility] 09/05/2015, 11:20 AM  LOS: 8 days

## 2015-09-06 DIAGNOSIS — I10 Essential (primary) hypertension: Secondary | ICD-10-CM

## 2015-09-06 LAB — CBC
HCT: 24.5 % — ABNORMAL LOW (ref 36.0–46.0)
Hemoglobin: 8.1 g/dL — ABNORMAL LOW (ref 12.0–15.0)
MCH: 28.3 pg (ref 26.0–34.0)
MCHC: 33.1 g/dL (ref 30.0–36.0)
MCV: 85.7 fL (ref 78.0–100.0)
Platelets: 294 10*3/uL (ref 150–400)
RBC: 2.86 MIL/uL — AB (ref 3.87–5.11)
RDW: 16.8 % — ABNORMAL HIGH (ref 11.5–15.5)
WBC: 5.2 10*3/uL (ref 4.0–10.5)

## 2015-09-06 LAB — BASIC METABOLIC PANEL
Anion gap: 7 (ref 5–15)
BUN: 9 mg/dL (ref 6–20)
CHLORIDE: 108 mmol/L (ref 101–111)
CO2: 25 mmol/L (ref 22–32)
Calcium: 8.4 mg/dL — ABNORMAL LOW (ref 8.9–10.3)
Creatinine, Ser: 0.47 mg/dL (ref 0.44–1.00)
GFR calc non Af Amer: >60 mL/min (ref >60)
Glucose, Bld: 96 mg/dL (ref 65–99)
POTASSIUM: 3.2 mmol/L — AB (ref 3.5–5.1)
SODIUM: 140 mmol/L (ref 135–145)

## 2015-09-06 MED ORDER — FERROUS SULFATE 300 (60 FE) MG/5ML PO SYRP
300.0000 mg | ORAL_SOLUTION | Freq: Two times a day (BID) | ORAL | Status: DC
  Filled 2015-09-06 (×2): qty 5

## 2015-09-06 MED ORDER — POTASSIUM CHLORIDE CRYS ER 20 MEQ PO TBCR: 40.0000 meq | EXTENDED_RELEASE_TABLET | Freq: Two times a day (BID) | ORAL | Status: DC

## 2015-09-06 MED ORDER — LISINOPRIL 10 MG PO TABS
10.0000 mg | ORAL_TABLET | Freq: Every day | ORAL | Status: DC
  Administered 2015-09-06: 10 mg via ORAL
  Filled 2015-09-06: qty 1

## 2015-09-06 MED ORDER — CLONIDINE HCL 0.3 MG/24HR TD PTWK
0.3000 mg | MEDICATED_PATCH | TRANSDERMAL | Status: DC
  Administered 2015-09-06: 0.3 mg via TRANSDERMAL
  Filled 2015-09-06: qty 1

## 2015-09-06 MED ORDER — FERROUS SULFATE 300 (60 FE) MG/5ML PO SYRP
300.0000 mg | ORAL_SOLUTION | Freq: Two times a day (BID) | ORAL | Status: DC
  Administered 2015-09-06: 300 mg via ORAL
  Filled 2015-09-06 (×2): qty 5

## 2015-09-06 MED ORDER — CEPHALEXIN 500 MG PO CAPS: 500.0000 mg | ORAL_CAPSULE | Freq: Two times a day (BID) | ORAL | Status: AC

## 2015-09-06 MED ORDER — LACOSAMIDE 50 MG PO TABS: 100.0000 mg | ORAL_TABLET | Freq: Two times a day (BID) | ORAL | Status: DC

## 2015-09-06 MED ORDER — POTASSIUM CHLORIDE CRYS ER 20 MEQ PO TBCR
40.0000 meq | EXTENDED_RELEASE_TABLET | Freq: Two times a day (BID) | ORAL | Status: DC
  Administered 2015-09-06: 40 meq via ORAL
  Filled 2015-09-06: qty 2

## 2015-09-06 MED ORDER — COLLAGENASE 250 UNIT/GM EX OINT: TOPICAL_OINTMENT | Freq: Two times a day (BID) | CUTANEOUS | Status: DC

## 2015-09-06 MED ORDER — FERROUS SULFATE 300 (60 FE) MG/5ML PO SYRP: 300.0000 mg | ORAL_SOLUTION | Freq: Two times a day (BID) | ORAL | Status: DC

## 2015-09-06 NOTE — Plan of Care (Signed)
Problem: Phase III Progression Outcomes Goal: Foley discontinued Outcome: Not Applicable Date Met:  09/06/15 Patient has stage IV decubitis ulcer     

## 2015-09-06 NOTE — Progress Notes (Signed)
CSW contacted son to inform of patient transport to facility. Son Joseph Berkshireorrie understands and is agreeable to [facility]. Son expressed that he understands that there is no other options at this point, and would like for his mother to get the care that she needs. CSW provided encouragement to son. Son voiced that he is appreciative of CSW effort and support. No further CSW needs reported or requested at this time. CSW to sign off.   Please consult if further CSW needs arise.   Fernande BoydenJoyce Krissia Schreier, [facility] Clinical Social Worker [facility] [phone]

## 2015-09-06 NOTE — Discharge Summary (Signed)
Physician Discharge Summary  [name] BHA:[id number] DOB: Mar 29, 1966 DOA: 08/28/2015  PCP: Arnette Norris, MD  Admit date: 08/28/2015 Discharge date: 09/06/2015  Time spent: 30 minutes  Recommendations for Outpatient Follow-up:  Follow up with hydrotherapy as recommended.  Please follow up with gastroenterology for work up of her anemia and for possible colonoscopy as outpatient.  Please follow up with PCP in 1 to 2 weeks, post hospitalization visit.  Please obtain a cbc and a bmp in 2 weeks to check her hemoglobin and potassium level.   Discharge Diagnoses:  Active Problems:   HTN (hypertension)   Obesity   Seizures (HCC)   Hypernatremia   Dysphagia   Sepsis ([place])   UTI (lower urinary tract infection)   Decubitus ulcer of sacral region, stage 4 (HCC)   Abscess, sacrum ([place])   Palliative care encounter   DNR (do not resuscitate) discussion   Adult failure to thrive   Discharge Condition: improved  Diet recommendation: dysphagia 1 diet  With thin liquids with full supervision and assistance.   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 On admission, sodium noted to be 158, white count of 15 and patient felt to be in sepsis secondary to a stage IV sacral decubitus ulcer with large amounts of pus drainage. Patient aggressively hydrated and given antibiotics and admitted to stepdown.   Today, patient looks to be doing much better. Vital signs stable. White count slowly improving. She is more alert. Seen by general surgery who recommended CT for better determination of abscess. Blood cultures grew proteus, urine cultures grew E  coli and both are sensitive to keflex.  repeat blood cultures ordered and are negative so far.    CT of the abdomen showed extensive occlusive thrombus starting lower IVC at the bifurcation extending in the left common iliac, external iliac and left femoral vein.  She was stared on IV heparin,but the morning after she was started on IV heparin, she had some blood tinged stools and IV heparin was stopped. Discussed with the son over the phone and requested IR consult for IVC filter placement. IVC filter was placed on 10/25. Discussed with the son that she will benefit from gastroenterology consult in the future for evaluation of blood int he stools, after her acute illness has resolved.  He reported that he will get appt with Gi for possible colonoscopy in the future. ( told him that she might not be a candidate for gen anesthetia) .  Her H&h has been stable since then and she has not required any blood transfusion and we have not seen any more blood in the stools or malena.     Hospital Course:  HTN (hypertension): resume anti hypertensives. Very well controlled.    Obesity: Patient meets criteria with BMI greater than 30   Seizures ([place]): Seizure medications changed to po.. No seizure activity.    Hypernatremia: Patient has had this issue in the past. Likely secondary to dehydration from sepsis. Resolved with hydration.  Hypokalemia: replete as needed.    Dysphagia: Given improvement in alertness, patient put back on her baseline diet: Dysphagia 1 with thin liquids and full  assistance   Sepsis ([place]) secondary to sacral abscess/stage IV decubitus ulcer of sacral region:  Patient met criteria on admission given hypotension, leukocytosis, fever and sacral source. Responded well to aggressive IV fluids and antibiotics. Wound care consulted and recommendations given. She is getting hydrotherapy with PT. Marland Kitchen Spoke with radiology and CT of pelvis with contrast ordered , showed extensive occlusive  thrombus starting lower IVC at the bifurcation extending in the left common iliac vein, external iliac and left femoral vein. No gluteal abscess. Mild proctitis. Air mattress ordered. Have ordered scheduled morphine since patient is unable to communicate pain. She was started on IV heparin overnight for the IV thrombus, but, she had blood tinged stools and we had to stop the IV heparin. H&h stable .  Requested IR consult for IVC filter and one was placed on 10/25.  Discussed with the son that she will benefit from gastroenterology consult in the future for evaluation of blood in the stools, after her acute illness has resolved.  He reported that he will get appt with Gi for possible colonoscopy in the future. ( told him that she might not be a candidate for gen anesthetia) .  Her H&h has been stable since then and she has not required any blood transfusion and we have not seen any more blood in the stools or malena.    Blood cultures show proteus and urine cultures grew E COLI. Both are sensitive to fortaz and bactrim, and keflex, Repeat blood cultures ordered and are negative. IV vancomycin and fortaz were discontinued and she will be discharged on keflex to complete the course. Social worker to see if she can get hydrotherapy at SNF.    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 Anemia panel shows low iron levels, she was started on iron sulfate. Her ferritin levels in 500's. Recommend further evaluation with GI in the future, when her acute illness has resolved.     UTI (lower urinary tract infection): growing e coli and covered with keflex.    Palliative care encounter: Have asked palliative care for assistance. PLAN TO continue the same level of aggressive care.   Procedures:  Hydrotherapy.  Consultations:  General surgery  Palliative care  IR  Discharge Exam: Filed Vitals:   09/06/15 0614  BP: 145/95  Pulse: 86  Temp: 99.3 F (37.4  C)  Resp: 20    General: sleeping comfortably Cardiovascular: s1s2 Respiratory: ctab.   Discharge Instructions   Discharge Instructions    Diet - low sodium heart healthy    Complete by:  As directed      Discharge instructions    Complete by:  As directed   Follow up with Gastroenterology in 2 to 4 weeks for possible EGD or colonoscopy.  Please follow up with PCP in 2 weeks post hospitalization visit.  Please obtain a BMP to check potassium level in 2 days.  Please continue with hydrotherapy with PT.          Current Discharge Medication List    START taking these medications   Details  cephALEXin (KEFLEX) 500 MG capsule Take 1 capsule (500 mg total) by mouth every 12 (twelve) hours. Qty: 8 capsule, Refills: 0    collagenase (SANTYL) ointment Apply topically 2 (two) times daily. Qty: 15 g, Refills: 0    potassium chloride SA (K-DUR,KLOR-CON) 20 MEQ tablet Take 2 tablets (40 mEq total) by mouth 2 (two) times daily. Qty: 2 tablet, Refills: 0  CONTINUE these medications which have NOT CHANGED   Details  acetaminophen (TYLENOL) 325 MG tablet Take 2 tablets (650 mg total) by mouth daily.    Amino Acids-Protein Hydrolys (FEEDING SUPPLEMENT, PRO-STAT SUGAR FREE 64,) LIQD Take 30 mLs by mouth daily.    Ascorbic Acid (VITAMIN C) 1000 MG tablet Take 1,000 mg by mouth daily.    carboxymethylcellulose (REFRESH PLUS) 0.5 % SOLN Place 2 drops into both eyes 3 (three) times daily.    Cholecalciferol (VITAMIN D3) 5000 UNITS TABS Take 1 tablet by mouth daily with breakfast.    cholestyramine light (PREVALITE) 4 G packet Take 4 g by mouth 3 (three) times daily. mix in 8 oz. Of Liquid    cloNIDine (CATAPRES - DOSED IN MG/24 HR) 0.3 mg/24hr patch Place 1 patch (0.3 mg total) onto the skin once a week. Qty: 4 patch, Refills: 0    famotidine (PEPCID) 40 MG/5ML suspension Take 2.5 mLs (20 mg total) by mouth every 12 (twelve) hours. Qty: 50 mL, Refills: 0    feeding supplement,  ENSURE ENLIVE, (ENSURE ENLIVE) LIQD Take 237 mLs by mouth 2 (two) times daily between meals. Qty: 60 Bottle, Refills: 0    lacosamide (VIMPAT) 50 MG TABS tablet Take 2 tablets (100 mg total) by mouth 2 (two) times daily. Qty: 60 tablet, Refills: 0    levETIRAcetam (KEPPRA) 750 MG tablet Take 1,500 mg by mouth 2 (two) times daily.    lisinopril (PRINIVIL,ZESTRIL) 10 MG tablet Take 10 mg by mouth daily.    loperamide (IMODIUM A-D) 2 MG tablet Take 4 mg by mouth 4 (four) times daily as needed for diarrhea or loose stools.    metoprolol (LOPRESSOR) 100 MG tablet Take 1 tablet (100 mg total) by mouth 2 (two) times daily.    mirtazapine (REMERON) 7.5 MG tablet Take 7.5 mg by mouth at bedtime.    Multiple Vitamin (MULTIVITAMIN WITH MINERALS) TABS tablet Take 1 tablet by mouth daily with breakfast.    traZODone (DESYREL) 50 MG tablet Take 25 mg by mouth at bedtime as needed for sleep.       STOP taking these medications     acetaminophen (TYLENOL) 650 MG suppository      minoxidil (LONITEN) 10 MG tablet        Allergies  Allergen Reactions  . Amlodipine Swelling  . Penicillins Swelling  . Latex Rash    Unknown   . Other Other (See Comments)    Natural Rubber- Unknown    Follow-up Information    Follow up with Arnette Norris, MD. Schedule an appointment as soon as possible for a visit in 2 weeks.   Specialty:  Family Medicine   Contact information:   [place] [address] [phone]        The results of significant diagnostics from this hospitalization (including imaging, microbiology, ancillary and laboratory) are listed below for reference.    Significant Diagnostic Studies: Dg Chest 2 View  09/05/2015  CLINICAL DATA:  Fever EXAM: CHEST  2 VIEW COMPARISON:  August 28, 2015 FINDINGS: There is no edema or consolidation. The heart size and pulmonary vascularity are normal. No adenopathy. No bone lesions. IMPRESSION: No edema or consolidation.  Electronically Signed   By: Lowella Grip III M.D.   On: 09/05/2015 11:58   Ct Head Wo Contrast  08/29/2015  CLINICAL DATA:  One week of decreasing level of responsiveness. Evaluate for hemorrhage. History of ruptured cerebral aneurysm, seizure, stroke, hypertension. EXAM: CT HEAD WITHOUT  CONTRAST TECHNIQUE: Contiguous axial images were obtained from the base of the skull through the vertex without intravenous contrast. COMPARISON:  CT head June 01, 2015 FINDINGS: Mild motion degraded examination. Bilateral mesial frontal lobe encephalomalacia with similar mineralization. Ex vacuo dilatation of bifrontal horns, no hydrocephalus. Patchy areas of LEFT inferior frontotemporal encephalomalacia extending to the insula. No midline shift, mass effect or mass lesions. No intraparenchymal hemorrhage. No acute large vascular territory infarcts. No abnormal extra-axial fluid collections. Basal cisterns are patent. Status post probable A-comm aneurysm clipping. Ocular globes and orbital contents are unremarkable. 1 cm bony excrescence from inner table RIGHT petrous apex could represent meningioma or, hyperostosis. Remote LEFT frontotemporal craniotomy with all orbital extension old RIGHT frontal burr hole. No skull fracture. IMPRESSION: No acute intracranial process. 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 LEFT frontotemporal encephalomalacia may be postoperative or, MCA territory infarct. Status post remote LEFT craniotomy for aneurysm clipping. Electronically Signed   By: Elon Alas M.D.   On: 08/29/2015 23:01   Ct Abdomen Pelvis W Contrast  08/29/2015  CLINICAL DATA:  Sepsis, possible gluteal abscess, sacral decubitus ulcer EXAM: CT ABDOMEN AND PELVIS WITH CONTRAST TECHNIQUE: Multidetector CT imaging of the abdomen and pelvis was performed using the standard protocol following bolus administration of intravenous contrast. CONTRAST:  180m OMNIPAQUE IOHEXOL 300 MG/ML  SOLN  COMPARISON:  None. FINDINGS: Lung bases are unremarkable. Sagittal images of the spine shows mild degenerative changes lower thoracic spine. Mild fatty infiltration of the liver. The patient is status postcholecystectomy. The pancreas, spleen and adrenal glands are unremarkable. No aortic aneurysm. Kidneys are symmetrical in size and enhancement. No hydronephrosis or hydroureter. There is a low-density lesion midpole of the left kidney measures 1.6 cm. Although this may represent a complex cyst a solid lesion cannot be excluded. Further correlation with ultrasound and MRI is recommended. No small bowel obstruction. Abundant stool noted in right colon and transverse colon. No pericecal inflammation. Normal appendix. Moderate stool noted in distal sigmoid colon and rectum. There is some enhancement of mucosa in distal sigmoid colon. Mild thickening of distal sigmoid colon wall. Mild colitis cannot be excluded. Delayed images of the pelvis shows a Foley catheter in a decompressed urinary bladder. Moderate stool and liquid noted within rectum. Mild stranding of perianal fat and minimal thickening of anal wall probable due to proctitis. In axial image 9 there is skin defect and soft tissue wound just left to the coccyx region. This is consistent with the known decubitus ulcer. There is no evidence of subcutaneous or gluteal abscess. Minimal soft tissue edema surrounding the anus. Small amount of air within anterior aspect of the urinary bladder probable post instrumentation. Small bilateral inguinal region lymph nodes probable reactive. There is occlusive thrombus starting in lower IVC axial image 52 left common iliac vein, external iliac vein and left femoral vein. IMPRESSION: 1. Extensive occlusive thrombus starting lower IVC at the bifurcation extending in left common iliac vein, external iliac vein, left femoral vein. The right aspect of IVC bifurcation and right iliac veins are patent. 2. There is skin defect  consistent with decubitus ulcer left posterior pelvis just lateral to coccygeal region without evidence of definite abscess. No gluteal abscess. 3. Indeterminate low-density lesion midpole of the left kidney. Further correlation with ultrasound and/or MRI is recommended. 4. Abundant stool noted in right colon and transverse colon. Normal appendix. 5. No small bowel obstruction. 6. No hydronephrosis or hydroureter. Mild thickening of distal sigmoid colon wall and mild enhancement of  mucosa. Mild wall edema or colitis cannot be entirely excluded. 7. Mild thickening of anal wall.  Mild proctitis cannot be excluded. 8. These results were called by telephone at the time of interpretation on 08/29/2015 at 7:30 pm to patient's ICU nurse Kennyth Lose who verbally acknowledged these results. Electronically Signed   By: Lahoma Crocker M.D.   On: 08/29/2015 19:31   Ir Ivc Filter Plmt / S&i /img Guid/mod Sed  08/31/2015  CLINICAL DATA:  49 year old female with a history of DVT, with anti coagulation contraindicated EXAM: 1. ULTRASOUND GUIDANCE FOR VASCULAR ACCESS OF THE RIGHT INTERNAL JUGULAR VEIN. 2. IVC VENOGRAM. 3. PERCUTANEOUS IVC FILTER PLACEMENT. ANESTHESIA/SEDATION: None CONTRAST:  29m OMNIPAQUE IOHEXOL 300 MG/ML  SOLN FLUOROSCOPY TIME:  48 seconds PROCEDURE: The procedure, risks, benefits, and alternatives were explained to the patient. Specific risks discussed include bleeding, infection, contrast reaction, renal failure, IVC filter fracture, migration, ilio caval thrombus (3% incidence), need for further procedure, need for further surgery, pulmonary embolism, cardiopulmonary collapse, death. Questions regarding the procedure were encouraged and answered. The patient understands and consents to the procedure. Ultrasound survey was performed with images stored and sent to PACs. The neck was prepped with Betadine in a sterile fashion, and a sterile drape was applied covering the operative field. A sterile gown and sterile  gloves were used for the procedure. Local anesthesia was provided with 1% Lidocaine. A micropuncture needle was used access the right internal jugular vein under ultrasound. With excellent venous blood flow returned, and an .018 micro wire was passed through the needle. Small incision was made with an 11 blade scalpel. The needle was removed, and a micropuncture sheath was placed over the wire. The inner dilator and wire were removed, and an 035 Bentson wire was advanced under fluoroscopy into the IVC. Serial dilation of the soft tissue tract was performed with an 8 FPakistandilator and subsequently a 10 FPakistandilator. The delivery sheath for a retrievable Bard Denali filter was passed over the Bentson wire into the IVC. The wire was removed and small contrast was used to confirm IVC location. IVC cavagram performed. Dilator was removed, and the IVC filter was then delivered, positioned below the lowest renal vein. Repeat cavagram performed, and the catheter was removed. Manual pressure was used for hemostasis. Patient tolerated the procedure well and remained hemodynamically stable throughout. No complications were encountered and no significant blood loss was encounter. COMPLICATIONS: None. FINDINGS: IVC venography demonstrates a normal caliber IVC with no evidence of thrombus. Renal veins are identified bilaterally. The IVC filter was successfully positioned below the level of the renal veins and is appropriately oriented. This IVC filter has both permanent and retrievable indications. IMPRESSION: Status post placement of a retrievable IVC filter. Signed, JDulcy Fanny WEarleen Newport DO [facility] PLAN: If the patient is deemed a candidate for anti coagulation, would recommend referral to Interventional Radiology Clinic for evaluation of IVC filter retrieval. If the patient can never be given anti coagulation and the filter should be permanent, would recommend  retrieval of this temporary/retrieval IVC filter, and placement of a filter with a permanent indication. Electronically Signed   By: JCorrie MckusickD.O.   On: 08/31/2015 07:37   Dg Chest Port 1 View  08/28/2015  CLINICAL DATA:  Hypotension with tachycardia and altered mental status with elevated white blood cell count. EXAM: PORTABLE CHEST 1 VIEW COMPARISON:  06/03/2015 FINDINGS: Lungs are adequately inflated without consolidation or effusion. Cardiomediastinal silhouette and remainder of the  exam is unchanged. IMPRESSION: No active disease. Electronically Signed   By: Marin Olp M.D.   On: 08/28/2015 14:40    Microbiology: Recent Results (from the past 240 hour(s))  Blood Culture (routine x 2)     Status: None   Collection Time: 08/28/15  2:15 PM  Result Value Ref Range Status   Specimen Description RIGHT ANTECUBITAL  Final   Special Requests BOTTLES DRAWN AEROBIC AND ANAEROBIC 5CC  Final   Culture  Setup Time   Final    GRAM NEGATIVE RODS AEROBIC BOTTLE ONLY CRITICAL RESULT CALLED TO, READ BACK BY AND VERIFIED WITH: J. THIGPEN,RN AT [id number] ON [id number] BY Rhea Bleacher    Culture   Final    PROTEUS MIRABILIS Performed at [facility]    Report Status 09/01/2015 FINAL  Final   Organism ID, Bacteria PROTEUS MIRABILIS  Final      Susceptibility   Proteus mirabilis - MIC*    AMPICILLIN <=2 SENSITIVE Sensitive     CEFAZOLIN <=4 SENSITIVE Sensitive     CEFEPIME <=1 SENSITIVE Sensitive     CEFTAZIDIME <=1 SENSITIVE Sensitive     CEFTRIAXONE <=1 SENSITIVE Sensitive     CIPROFLOXACIN >=4 RESISTANT Resistant     GENTAMICIN <=1 SENSITIVE Sensitive     IMIPENEM 4 SENSITIVE Sensitive     TRIMETH/SULFA <=20 SENSITIVE Sensitive     AMPICILLIN/SULBACTAM <=2 SENSITIVE Sensitive     PIP/TAZO <=4 SENSITIVE Sensitive     * PROTEUS MIRABILIS  Blood Culture (routine x 2)     Status: None   Collection Time: 08/28/15  2:20 PM  Result Value Ref Range Status   Specimen Description LEFT  ANTECUBITAL  Final   Special Requests BOTTLES DRAWN AEROBIC AND ANAEROBIC 5CC  Final   Culture   Final    NO GROWTH 5 DAYS Performed at [facility]    Report Status 09/02/2015 FINAL  Final  Urine culture     Status: None   Collection Time: 08/28/15  2:29 PM  Result Value Ref Range Status   Specimen Description URINE, CATHETERIZED  Final   Special Requests NONE  Final   Culture   Final    >=100,000 COLONIES/mL ESCHERICHIA COLI Performed at [facility]    Report Status 08/31/2015 FINAL  Final   Organism ID, Bacteria ESCHERICHIA COLI  Final      Susceptibility   Escherichia coli - MIC*    AMPICILLIN 8 SENSITIVE Sensitive     CEFAZOLIN <=4 SENSITIVE Sensitive     CEFTRIAXONE <=1 SENSITIVE Sensitive     CIPROFLOXACIN >=4 RESISTANT Resistant     GENTAMICIN <=1 SENSITIVE Sensitive     IMIPENEM <=0.25 SENSITIVE Sensitive     NITROFURANTOIN 128 RESISTANT Resistant     TRIMETH/SULFA <=20 SENSITIVE Sensitive     AMPICILLIN/SULBACTAM 4 SENSITIVE Sensitive     PIP/TAZO <=4 SENSITIVE Sensitive     * >=100,000 COLONIES/mL ESCHERICHIA COLI  MRSA PCR Screening     Status: None   Collection Time: 08/28/15  6:00 PM  Result Value Ref Range Status   MRSA by PCR NEGATIVE NEGATIVE Final    Comment:        The GeneXpert MRSA Assay (FDA approved for NASAL specimens only), is one component of a comprehensive MRSA colonization surveillance program. It is not intended to diagnose MRSA infection nor to guide or monitor treatment for MRSA infections.   Culture, blood (routine x 2)     Status: None (Preliminary result)  Collection Time: 09/02/15  8:50 PM  Result Value Ref Range Status   Specimen Description BLOOD LEFT HAND  Final   Special Requests IN PEDIATRIC BOTTLE 1ML  Final   Culture   Final    NO GROWTH 3 DAYS Performed at [facility]    Report Status PENDING  Incomplete  Culture, blood (routine x 2)     Status: None (Preliminary result)   Collection Time:  09/03/15 12:10 AM  Result Value Ref Range Status   Specimen Description BLOOD RIGHT FOOT  Final   Special Requests IN PEDIATRIC BOTTLE 3CC  Final   Culture   Final    NO GROWTH 2 DAYS Performed at [facility]    Report Status PENDING  Incomplete     Labs: Basic Metabolic Panel:  Recent Labs Lab 08/31/15 0345 09/01/15 0508 09/03/15 0615 09/06/15 0554  NA 151* 147* 142 140  K 3.4* 3.2* 4.2 3.2*  CL 127* 122* 116* 108  CO2 18* 18* 19* 25  GLUCOSE 100* 93 99 96  BUN 16 14 8 9   CREATININE 0.63 0.63 0.55 0.47  CALCIUM 8.6* 8.5* 8.7* 8.4*   Liver Function Tests: No results for input(s): AST, ALT, ALKPHOS, BILITOT, PROT, ALBUMIN in the last 168 hours. No results for input(s): LIPASE, AMYLASE in the last 168 hours. No results for input(s): AMMONIA in the last 168 hours. CBC:  Recent Labs Lab 09/02/15 0645 09/03/15 0615 09/04/15 1417 09/05/15 0545 09/06/15 0554  WBC 6.0 8.1 7.5 7.1 5.2  HGB 8.2* 7.9* 8.3* 7.7* 8.1*  HCT 25.1* 24.0* 25.3* 23.0* 24.5*  MCV 86.3 85.7 85.8 83.6 85.7  PLT 228 299 283 274 294   Cardiac Enzymes: No results for input(s): CKTOTAL, CKMB, CKMBINDEX, TROPONINI in the last 168 hours. BNP: BNP (last 3 results)  Recent Labs  03/18/15 1240 08/29/15 1305  BNP 69.7 47.2    ProBNP (last 3 results) No results for input(s): PROBNP in the last 8760 hours.  CBG: No results for input(s): GLUCAP in the last 168 hours.     SignedHosie Poisson  [facility] 09/06/2015, 12:10 PM

## 2015-09-06 NOTE — Progress Notes (Signed)
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  Patient and Family Stated Goals pt nonverbal  Prior Treatments bedside surgical debridement by MD  Evaluation and Treatment  Evaluation and Treatment Procedures Explained to Patient/Family Yes  Evaluation and Treatment Procedures Patient unable to consent due to mental status  Wound / Incision (Open or Dehisced) 08/30/15 Other (Comment) Sacrum Mid Stage IV Pressure Sacral Pressure Ulcer  Date First Assessed/Time First Assessed: 08/30/15 1205   Wound Type: (c) Other (Comment)  Location: Sacrum  Location Orientation: Mid  Wound Description (Comments): Stage IV Pressure Sacral Pressure Ulcer  Present on Admission: Yes  Dressing Type Moist to dry;Silicone dressing  Dressing Changed Changed  Dressing Status Old drainage  Dressing Change Frequency Twice a day  Site / Wound Assessment Brown;Red;Pink;Bleeding;Yellow  % Wound base Red or Granulating 40%  % Wound base Other (Comment) 60% (yellow/tan/brown slough/eschar)  Peri-wound Assessment Intact  Margins Unattached edges (unapproximated)  Drainage Amount Copious  Drainage Description Purulent;Odor  Treatment Debridement (Selective);Hydrotherapy (Pulse lavage);Packing (Saline gauze)  Hydrotherapy  Pulsed lavage therapy - wound location sacral pressure ulcer  Pulsed Lavage with Suction (psi) 4 psi  Pulsed Lavage with Suction - Normal Saline Used 1000 mL  Pulsed Lavage Tip Tip with splash shield  Selective Debridement  Selective Debridement - Location eschar/slough of sacral pressure ulcer  Selective Debridement - Tools Used Forceps;Scissors  Selective Debridement - Tissue Removed slough  Wound Therapy - Assess/Plan/Recommendations  Wound Therapy - Clinical Statement Pt seemed to tolerate session better today.  Recommended RN  send santyl and hydrotherapy gun to SNF with pt, also requested air mattress if possible.  Pt to d/c to SNF today.  Wound Therapy - Functional Problem List medically complex pt, limited mobility  Factors Delaying/Impairing Wound Healing Immobility;Multiple medical problems;Incontinence  Hydrotherapy Plan Debridement;Dressing change;Patient/family education;Pulsatile lavage with suction  Wound Therapy - Frequency 6X / week  Wound Therapy - Follow Up Recommendations Skilled nursing facility  Wound Plan Perform pulsatile lavage, selective debridement and dressing changes to assist with wound healing and reducing bioburden.  10/26 seen with WOC and requested santyl   Wound Therapy Goals - Improve the function of patient's integumentary system by progressing the wound(s) through the phases of wound healing by:  Decrease Necrotic Tissue to 50%  Decrease Necrotic Tissue - Progress Progressing toward goal  Increase Granulation Tissue to 50%  Increase Granulation Tissue - Progress Progressing toward goal  Improve Drainage Characteristics Mod  Improve Drainage Characteristics - Progress Progressing toward goal  Goals/treatment plan/discharge plan were made with and agreed upon by patient/family No, Patient unable to participate in goals/treatment/discharge plan and family unavailable  Time For Goal Achievement 2 weeks  Wound Therapy - Potential for Goals Fair  Pt premedicated.   Time: [id number]  Zenovia JarredKati Jakeline Dave, PT, DPT 09/06/2015 Pager: [phone]

## 2015-09-06 NOTE — Progress Notes (Signed)
Gave report to JenningsWanda, RN [facility], at approximately [phone]. Left number if she had additional questions.

## 2015-09-06 NOTE — Progress Notes (Signed)
Date: September 06, 2015 Chart reviewed for concurrent status and case management needs. Will continue to follow patient for changes and needs: Rhonda Davis, RN, BSN, CCM   [phone] 

## 2015-09-06 NOTE — Progress Notes (Signed)
CSW made [facility] this morning [facility] [place] [facility], and is currently awaiting phone call back regarding bed availability. CSW will continue to follow.   Fernande BoydenJoyce Myanna Ziesmer, [facility]WA Clinical Social Worker [facility] [phone]

## 2015-09-06 NOTE — Progress Notes (Signed)
Nutrition Follow-up  DOCUMENTATION CODES:   Obesity unspecified  INTERVENTION:  - Continue Ensure Enlive BID - Will order Magic Cup TID with meals, each supplement provides 290 kcal and 9 grams of protein - Provide encouragement with meals and supplements - RD will continue to monitor for needs  NUTRITION DIAGNOSIS:   Inadequate oral intake related to dysphagia as evidenced by percent weight loss (chart review). 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 Over the past week, she was noted to be less and less responsive and she was brought into the emergency room today. At that time her sodium was noted to be 158 (136 2-1/2 months ago), white count of 15 and during evaluation of patient and her chronic stage IV sacral decubitus ulcer, large amount of yellowish fluid felt to be pus drained out of this  11/1 Pt unable to provide information. Per chart review, she ate 3 bites of applesauce and 1 bite of oatmeal for breakfast yesterday, 25% of lunch yesterday which consisted of meat, vegetables, and dessert, and 50% of dinner yesterday: 75% of sweet potatoes and 50% of peas and pork. Per review, for breakfast this AM she ate most of her eggs and a few bites of grits.  Not meeting needs. Will add Magic Cup as additional supplement and monitor for acceptance of this. Medications reviewed. Labs reviewed; K: 3.2 mmol/L, Ca: 8.4 mg/dL.    10/28 - Pt is unresponsive so I was unable to get a true diet history.  - Appears she had a brain aneurysm 08/2013; diagnosed with dysphagia upon admit 08/28/2015.  - Pt exhibits 20#/10% wt loss in 5 months, consistent  with increased needs from her STG IV chronic pressure ulcer. - It is also very possible that her dysphagia has gotten significantly worse in the past 5 months leading to this wt loss concurrent with pressure ulcer.  - Nutrition-Focused physical exam completed. Findings are no fat depletion, no muscle depletion, and mild edema.   Diet Order:  DIET - DYS 1 Room service appropriate?: Yes; Fluid consistency:: Thin  Skin:  Wound (see comment) (Stg 4 pressure ulcer to sacrum.)  Last BM:  11/1  Height:   Ht Readings from Last 1 Encounters:  08/28/15 5\' 7"  (1.702 m)    Weight:   Wt Readings from Last 1 Encounters:  08/28/15 176 lb 2.4 oz (79.9 kg)    Ideal Body Weight:  61.36 kg  BMI:  Body mass index is 27.58 kg/(m^2).  Estimated Nutritional Needs:   Kcal:  2400 - 2800  Protein:  80 - 100 grams  Fluid:  >/= 2.4L  EDUCATION NEEDS:   No education needs identified at this time     Trenton GammonJessica Sutton Plake, RD, LDN Inpatient Clinical Dietitian Pager # [phone] After hours/weekend pager # [phone]

## 2015-09-06 NOTE — Clinical Social Work Placement (Signed)
   CLINICAL SOCIAL WORK PLACEMENT  NOTE  Date:  09/06/2015  Patient Details  Name: Donald PoreLaura Marie Dahlstrom MRN: [id number] Date of Birth: 05/12/1966  Clinical Social Work is seeking post-discharge placement for this patient at the Skilled  Nursing Facility level of care (*CSW will initial, date and re-position this form in  chart as items are completed):  No   Patient/family provided with [facility] Clinical Social Work Department's list of facilities offering this level of care within the geographic area requested by the patient (or if unable, by the patient's family).  Yes   Patient/family informed of their freedom to choose among providers that offer the needed level of care, that participate in Medicare, Medicaid or managed care program needed by the patient, have an available bed and are willing to accept the patient.  Yes   Patient/family informed of [place]'s ownership interest in [facility] and [facility], as well as of the fact that they are under no obligation to receive care at these facilities.  PASRR submitted to EDS on       PASRR number received on       Existing PASRR number confirmed on 08/31/15     FL2 transmitted to all facilities in geographic area requested by pt/family on 08/31/15     FL2 transmitted to all facilities within larger geographic area on 08/31/15     Patient informed that his/her managed care company has contracts with or will negotiate with certain facilities, including the following:        Yes   Patient/family informed of bed offers received.  Patient chooses bed at  [facility]     Physician recommends and patient chooses bed at      Patient to be transferred to  [facility] [facility] on 09/06/15.  Patient to be transferred to facility by  Sharin Mons([facility])     Patient family notified on 09/06/15 of transfer.  Name of family member notified:  Son Torrie Kyllonen     PHYSICIAN       Additional Comment:     _______________________________________________ Loleta DickerJoyce S Ryian Lynde, LCSW 09/06/2015, 1:19 PM

## 2015-09-06 NOTE — Progress Notes (Signed)
[place] [facility] [place] [facility], and they are able to take the patient on today. CSW contacted patient's son Torrie to inform of bed offer. Son informed CSW that he would be visiting/researching the facility, and would follow up with CSW regarding offer. Facility requests that transportation be arranged for patient around 1:30PM. CSW will continue to follow.  Fernande BoydenJoyce Getsemani Lindon, [facility] MartinezCSWA Clinical Social Worker [facility] [phone]

## 2015-09-06 NOTE — Progress Notes (Signed)
CSW spoke with family to inform of available facilities providing hydrotherapy. Son Joseph Berkshireorrie expressed frustration with limited facilities available. [facility] and has agreed to complete paperwork for facility at 2:00PM. RN is requesting that the patient be transported after hydrotherapy. Facility is willing to take patient at any time today. Once CSW receives update by RN, CSW will arrange for patient to be [facility]. No further CSW needs were requested at this time. CSW to sign off.   Please consult if further CSW needs arise.  Fernande BoydenJoyce Louella Medaglia, [facility]SWA Clinical Social Worker [facility] [phone]

## 2015-09-07 ENCOUNTER — Telehealth: Payer: Self-pay | Admitting: *Deleted

## 2015-09-07 LAB — CULTURE, BLOOD (ROUTINE X 2): Culture: NO GROWTH

## 2015-09-07 NOTE — Telephone Encounter (Signed)
Error

## 2015-09-08 LAB — CULTURE, BLOOD (ROUTINE X 2): Culture: NO GROWTH

## 2015-10-12 ENCOUNTER — Encounter (HOSPITAL_COMMUNITY): Payer: Self-pay

## 2015-10-12 ENCOUNTER — Inpatient Hospital Stay (HOSPITAL_COMMUNITY)
Admission: EM | Admit: 2015-10-12 | Discharge: 2015-10-26 | DRG: 871 | Disposition: A | Discharge status: Skilled Nursing Facility | Payer: 59 | Attending: Internal Medicine | Admitting: Internal Medicine

## 2015-10-12 ENCOUNTER — Emergency Department (HOSPITAL_COMMUNITY): Payer: 59

## 2015-10-12 DIAGNOSIS — E860 Dehydration: Secondary | ICD-10-CM | POA: Diagnosis present

## 2015-10-12 DIAGNOSIS — K633 Ulcer of intestine: Secondary | ICD-10-CM | POA: Diagnosis present

## 2015-10-12 DIAGNOSIS — G8250 Quadriplegia, unspecified: Secondary | ICD-10-CM | POA: Diagnosis present

## 2015-10-12 DIAGNOSIS — R627 Adult failure to thrive: Secondary | ICD-10-CM | POA: Diagnosis present

## 2015-10-12 DIAGNOSIS — Z8673 Personal history of transient ischemic attack (TIA), and cerebral infarction without residual deficits: Secondary | ICD-10-CM

## 2015-10-12 DIAGNOSIS — M869 Osteomyelitis, unspecified: Secondary | ICD-10-CM | POA: Diagnosis present

## 2015-10-12 DIAGNOSIS — K219 Gastro-esophageal reflux disease without esophagitis: Secondary | ICD-10-CM | POA: Diagnosis present

## 2015-10-12 DIAGNOSIS — R402342 Coma scale, best motor response, flexion withdrawal, at arrival to emergency department: Secondary | ICD-10-CM | POA: Diagnosis present

## 2015-10-12 DIAGNOSIS — R402142 Coma scale, eyes open, spontaneous, at arrival to emergency department: Secondary | ICD-10-CM | POA: Diagnosis present

## 2015-10-12 DIAGNOSIS — D649 Anemia, unspecified: Secondary | ICD-10-CM | POA: Diagnosis present

## 2015-10-12 DIAGNOSIS — N179 Acute kidney failure, unspecified: Secondary | ICD-10-CM

## 2015-10-12 DIAGNOSIS — R402212 Coma scale, best verbal response, none, at arrival to emergency department: Secondary | ICD-10-CM | POA: Diagnosis present

## 2015-10-12 DIAGNOSIS — N170 Acute kidney failure with tubular necrosis: Secondary | ICD-10-CM

## 2015-10-12 DIAGNOSIS — R1310 Dysphagia, unspecified: Secondary | ICD-10-CM | POA: Diagnosis not present

## 2015-10-12 DIAGNOSIS — E46 Unspecified protein-calorie malnutrition: Secondary | ICD-10-CM | POA: Diagnosis present

## 2015-10-12 DIAGNOSIS — A4151 Sepsis due to Escherichia coli [E. coli]: Secondary | ICD-10-CM | POA: Diagnosis present

## 2015-10-12 DIAGNOSIS — N390 Urinary tract infection, site not specified: Secondary | ICD-10-CM | POA: Diagnosis not present

## 2015-10-12 DIAGNOSIS — K921 Melena: Secondary | ICD-10-CM | POA: Diagnosis present

## 2015-10-12 DIAGNOSIS — E870 Hyperosmolality and hypernatremia: Secondary | ICD-10-CM | POA: Diagnosis present

## 2015-10-12 DIAGNOSIS — Z515 Encounter for palliative care: Secondary | ICD-10-CM | POA: Diagnosis present

## 2015-10-12 DIAGNOSIS — Z87891 Personal history of nicotine dependence: Secondary | ICD-10-CM

## 2015-10-12 DIAGNOSIS — A419 Sepsis, unspecified organism: Secondary | ICD-10-CM | POA: Diagnosis not present

## 2015-10-12 DIAGNOSIS — K5900 Constipation, unspecified: Secondary | ICD-10-CM | POA: Diagnosis present

## 2015-10-12 DIAGNOSIS — I10 Essential (primary) hypertension: Secondary | ICD-10-CM | POA: Diagnosis present

## 2015-10-12 DIAGNOSIS — D62 Acute posthemorrhagic anemia: Secondary | ICD-10-CM | POA: Diagnosis not present

## 2015-10-12 DIAGNOSIS — G9340 Encephalopathy, unspecified: Secondary | ICD-10-CM | POA: Diagnosis present

## 2015-10-12 DIAGNOSIS — G9349 Other encephalopathy: Secondary | ICD-10-CM | POA: Diagnosis not present

## 2015-10-12 DIAGNOSIS — D696 Thrombocytopenia, unspecified: Secondary | ICD-10-CM | POA: Diagnosis not present

## 2015-10-12 DIAGNOSIS — N171 Acute kidney failure with acute cortical necrosis: Secondary | ICD-10-CM

## 2015-10-12 DIAGNOSIS — I959 Hypotension, unspecified: Secondary | ICD-10-CM | POA: Diagnosis present

## 2015-10-12 DIAGNOSIS — E872 Acidosis: Secondary | ICD-10-CM | POA: Diagnosis present

## 2015-10-12 DIAGNOSIS — Z7189 Other specified counseling: Secondary | ICD-10-CM | POA: Diagnosis not present

## 2015-10-12 DIAGNOSIS — R6521 Severe sepsis with septic shock: Secondary | ICD-10-CM | POA: Diagnosis present

## 2015-10-12 DIAGNOSIS — N178 Other acute kidney failure: Secondary | ICD-10-CM | POA: Diagnosis not present

## 2015-10-12 DIAGNOSIS — E875 Hyperkalemia: Secondary | ICD-10-CM | POA: Diagnosis present

## 2015-10-12 DIAGNOSIS — E876 Hypokalemia: Secondary | ICD-10-CM | POA: Diagnosis present

## 2015-10-12 DIAGNOSIS — L89154 Pressure ulcer of sacral region, stage 4: Secondary | ICD-10-CM | POA: Diagnosis present

## 2015-10-12 DIAGNOSIS — Z86718 Personal history of other venous thrombosis and embolism: Secondary | ICD-10-CM | POA: Diagnosis not present

## 2015-10-12 DIAGNOSIS — Z930 Tracheostomy status: Secondary | ICD-10-CM | POA: Diagnosis not present

## 2015-10-12 DIAGNOSIS — Z789 Other specified health status: Secondary | ICD-10-CM

## 2015-10-12 DIAGNOSIS — L8990 Pressure ulcer of unspecified site, unspecified stage: Secondary | ICD-10-CM | POA: Diagnosis not present

## 2015-10-12 LAB — OSMOLALITY, URINE: OSMOLALITY UR: 537 mosm/kg (ref 300–900)

## 2015-10-12 LAB — BASIC METABOLIC PANEL
BUN: 98 mg/dL — AB (ref 6–20)
CO2: 16 mmol/L — ABNORMAL LOW (ref 22–32)
Calcium: 7.5 mg/dL — ABNORMAL LOW (ref 8.9–10.3)
Creatinine, Ser: 2.58 mg/dL — ABNORMAL HIGH (ref 0.44–1.00)
GFR calc Af Amer: 24 mL/min — ABNORMAL LOW (ref >60)
GFR calc non Af Amer: 21 mL/min — ABNORMAL LOW (ref >60)
GLUCOSE: 180 mg/dL — AB (ref 65–99)
POTASSIUM: 4.4 mmol/L (ref 3.5–5.1)
Sodium: 159 mmol/L — ABNORMAL HIGH (ref 135–145)

## 2015-10-12 LAB — URINE MICROSCOPIC-ADD ON

## 2015-10-12 LAB — POCT I-STAT 3, ART BLOOD GAS (G3+)
Acid-base deficit: 12 mmol/L — ABNORMAL HIGH (ref 0.0–2.0)
BICARBONATE: 13.3 meq/L — AB (ref 20.0–24.0)
O2 SAT: 98 %
PCO2 ART: 24.5 mmHg — AB (ref 35.0–45.0)
Patient temperature: 94.7
TCO2: 14 mmol/L (ref 0–100)
pH, Arterial: 7.333 — ABNORMAL LOW (ref 7.350–7.450)
pO2, Arterial: 99 mmHg (ref 80.0–100.0)

## 2015-10-12 LAB — COMPREHENSIVE METABOLIC PANEL
ALBUMIN: 2.3 g/dL — AB (ref 3.5–5.0)
ALBUMIN: 2 g/dL — AB (ref 3.5–5.0)
ALK PHOS: 72 U/L (ref 38–126)
ALK PHOS: 69 U/L (ref 38–126)
ALT: 17 U/L (ref 14–54)
ALT: 19 U/L (ref 14–54)
AST: 25 U/L (ref 15–41)
AST: 16 U/L (ref 15–41)
BILIRUBIN TOTAL: 0.3 mg/dL (ref 0.3–1.2)
BUN: 113 mg/dL — ABNORMAL HIGH (ref 6–20)
BUN: 97 mg/dL — AB (ref 6–20)
CALCIUM: 7.6 mg/dL — AB (ref 8.9–10.3)
CO2: 19 mmol/L — ABNORMAL LOW (ref 22–32)
CO2: 16 mmol/L — AB (ref 22–32)
CREATININE: 2.51 mg/dL — AB (ref 0.44–1.00)
Calcium: 8.6 mg/dL — ABNORMAL LOW (ref 8.9–10.3)
Chloride: >130 mmol/L (ref 101–111)
Chloride: >130 mmol/L (ref 101–111)
Creatinine, Ser: 3.15 mg/dL — ABNORMAL HIGH (ref 0.44–1.00)
GFR calc Af Amer: 19 mL/min — ABNORMAL LOW (ref >60)
GFR calc Af Amer: 25 mL/min — ABNORMAL LOW (ref >60)
GFR calc non Af Amer: 16 mL/min — ABNORMAL LOW (ref >60)
GFR calc non Af Amer: 22 mL/min — ABNORMAL LOW (ref >60)
GLUCOSE: 144 mg/dL — AB (ref 65–99)
GLUCOSE: 192 mg/dL — AB (ref 65–99)
Potassium: 4.6 mmol/L (ref 3.5–5.1)
Potassium: 3.9 mmol/L (ref 3.5–5.1)
SODIUM: 163 mmol/L — AB (ref 135–145)
SODIUM: 157 mmol/L — AB (ref 135–145)
TOTAL PROTEIN: 6.3 g/dL — AB (ref 6.5–8.1)
TOTAL PROTEIN: 5.7 g/dL — AB (ref 6.5–8.1)
Total Bilirubin: 0.4 mg/dL (ref 0.3–1.2)

## 2015-10-12 LAB — URINALYSIS, ROUTINE W REFLEX MICROSCOPIC
Bilirubin Urine: NEGATIVE
Glucose, UA: NEGATIVE mg/dL
Hgb urine dipstick: NEGATIVE
KETONES UR: 15 mg/dL — AB
NITRITE: NEGATIVE
PROTEIN: 100 mg/dL — AB
Specific Gravity, Urine: 1.026 (ref 1.005–1.030)
pH: 8 (ref 5.0–8.0)

## 2015-10-12 LAB — AMMONIA: Ammonia: 23 umol/L (ref 9–35)

## 2015-10-12 LAB — CBC WITH DIFFERENTIAL/PLATELET
BASOS ABS: 0 10*3/uL (ref 0.0–0.1)
BASOS PCT: 0 %
EOS ABS: 0.1 10*3/uL (ref 0.0–0.7)
Eosinophils Relative: 1 %
HCT: 24.2 % — ABNORMAL LOW (ref 36.0–46.0)
HEMOGLOBIN: 7.3 g/dL — AB (ref 12.0–15.0)
Lymphocytes Relative: 33 %
Lymphs Abs: 3.1 10*3/uL (ref 0.7–4.0)
MCH: 28 pg (ref 26.0–34.0)
MCHC: 30.2 g/dL (ref 30.0–36.0)
MCV: 92.7 fL (ref 78.0–100.0)
Monocytes Absolute: 0.3 10*3/uL (ref 0.1–1.0)
Monocytes Relative: 4 %
NEUTROS PCT: 62 %
Neutro Abs: 5.9 10*3/uL (ref 1.7–7.7)
Platelets: 279 10*3/uL (ref 150–400)
RBC: 2.61 MIL/uL — AB (ref 3.87–5.11)
RDW: 19 % — ABNORMAL HIGH (ref 11.5–15.5)
WBC: 9.4 10*3/uL (ref 4.0–10.5)

## 2015-10-12 LAB — CORTISOL: Cortisol, Plasma: 18.4 ug/dL

## 2015-10-12 LAB — I-STAT TROPONIN, ED: Troponin i, poc: 0 ng/mL (ref 0.00–0.08)

## 2015-10-12 LAB — OSMOLALITY: Osmolality: 364 mOsm/kg (ref 275–295)

## 2015-10-12 LAB — LIPASE, BLOOD: LIPASE: 23 U/L (ref 11–51)

## 2015-10-12 LAB — ABO/RH: ABO/RH(D): A POS

## 2015-10-12 LAB — I-STAT CG4 LACTIC ACID, ED: LACTIC ACID, VENOUS: 2.37 mmol/L — AB (ref 0.5–2.0)

## 2015-10-12 LAB — GLUCOSE, CAPILLARY: Glucose-Capillary: 140 mg/dL — ABNORMAL HIGH (ref 65–99)

## 2015-10-12 LAB — LACTIC ACID, PLASMA: Lactic Acid, Venous: 2.1 mmol/L (ref 0.5–2.0)

## 2015-10-12 LAB — PROCALCITONIN: Procalcitonin: 0.25 ng/mL

## 2015-10-12 MED ORDER — SODIUM CHLORIDE 0.9 % IV SOLN: 250.0000 mL | INTRAVENOUS | Status: DC | PRN

## 2015-10-12 MED ORDER — HYDROCORTISONE NA SUCCINATE PF 100 MG IJ SOLR
50.0000 mg | Freq: Four times a day (QID) | INTRAMUSCULAR | Status: DC
  Administered 2015-10-12 – 2015-10-14 (×7): 50 mg via INTRAVENOUS
  Filled 2015-10-12 (×2): qty 1
  Filled 2015-10-12: qty 2
  Filled 2015-10-12 (×2): qty 1
  Filled 2015-10-12 (×2): qty 2
  Filled 2015-10-12 (×3): qty 1
  Filled 2015-10-12: qty 2
  Filled 2015-10-12: qty 1

## 2015-10-12 MED ORDER — SODIUM CHLORIDE 0.9 % IV BOLUS (SEPSIS)
1000.0000 mL | Freq: Once | INTRAVENOUS | Status: AC
  Administered 2015-10-12: 1000 mL via INTRAVENOUS

## 2015-10-12 MED ORDER — DEXTROSE 5 % IV SOLN
1.0000 g | Freq: Three times a day (TID) | INTRAVENOUS | Status: DC
  Filled 2015-10-12: qty 1

## 2015-10-12 MED ORDER — NOREPINEPHRINE BITARTRATE 1 MG/ML IV SOLN
2.0000 ug/min | INTRAVENOUS | Status: DC
  Administered 2015-10-12: 16 ug/min via INTRAVENOUS
  Filled 2015-10-12: qty 4

## 2015-10-12 MED ORDER — LEVOFLOXACIN IN D5W 750 MG/150ML IV SOLN
750.0000 mg | Freq: Once | INTRAVENOUS | Status: AC
  Administered 2015-10-12: 750 mg via INTRAVENOUS
  Filled 2015-10-12: qty 150

## 2015-10-12 MED ORDER — SODIUM CHLORIDE 0.9 % IV SOLN
100.0000 mg | Freq: Two times a day (BID) | INTRAVENOUS | Status: DC
  Administered 2015-10-12 – 2015-10-17 (×10): 100 mg via INTRAVENOUS
  Filled 2015-10-12 (×20): qty 10

## 2015-10-12 MED ORDER — ONDANSETRON HCL 4 MG/2ML IJ SOLN
INTRAMUSCULAR | Status: AC
  Filled 2015-10-12: qty 2

## 2015-10-12 MED ORDER — DEXTROSE 5 % IV SOLN: 2.0000 g | Freq: Once | INTRAVENOUS | Status: DC

## 2015-10-12 MED ORDER — VANCOMYCIN HCL 500 MG IV SOLR
500.0000 mg | Freq: Once | INTRAVENOUS | Status: AC
  Administered 2015-10-12: 500 mg via INTRAVENOUS
  Filled 2015-10-12: qty 500

## 2015-10-12 MED ORDER — SODIUM CHLORIDE 0.9 % IV SOLN
1500.0000 mg | Freq: Two times a day (BID) | INTRAVENOUS | Status: DC
  Administered 2015-10-12 – 2015-10-17 (×10): 1500 mg via INTRAVENOUS
  Filled 2015-10-12 (×11): qty 15

## 2015-10-12 MED ORDER — HEPARIN SODIUM (PORCINE) 5000 UNIT/ML IJ SOLN
5000.0000 [IU] | Freq: Three times a day (TID) | INTRAMUSCULAR | Status: DC
  Administered 2015-10-12 – 2015-10-16 (×12): 5000 [IU] via SUBCUTANEOUS
  Filled 2015-10-12 (×14): qty 1

## 2015-10-12 MED ORDER — FAMOTIDINE IN NACL 20-0.9 MG/50ML-% IV SOLN
20.0000 mg | Freq: Two times a day (BID) | INTRAVENOUS | Status: DC
  Administered 2015-10-12 – 2015-10-17 (×10): 20 mg via INTRAVENOUS
  Filled 2015-10-12 (×14): qty 50

## 2015-10-12 MED ORDER — DEXTROSE 5 % IV SOLN
INTRAVENOUS | Status: DC
  Administered 2015-10-12: 19:00:00 via INTRAVENOUS

## 2015-10-12 MED ORDER — FLUCONAZOLE IN SODIUM CHLORIDE 200-0.9 MG/100ML-% IV SOLN
200.0000 mg | INTRAVENOUS | Status: DC
  Administered 2015-10-12 – 2015-10-13 (×2): 200 mg via INTRAVENOUS
  Filled 2015-10-12 (×3): qty 100

## 2015-10-12 MED ORDER — ATROPINE SULFATE 0.1 MG/ML IJ SOLN
INTRAMUSCULAR | Status: AC
  Filled 2015-10-12: qty 10

## 2015-10-12 MED ORDER — PHENYLEPHRINE 40 MCG/ML (10ML) SYRINGE FOR IV PUSH (FOR BLOOD PRESSURE SUPPORT)
120.0000 ug | PREFILLED_SYRINGE | Freq: Once | INTRAVENOUS | Status: AC
  Administered 2015-10-12: 120 ug via INTRAVENOUS

## 2015-10-12 MED ORDER — DEXTROSE 5 % IV SOLN
0.0000 ug/min | Freq: Once | INTRAVENOUS | Status: AC
  Administered 2015-10-12: 2 ug/min via INTRAVENOUS
  Filled 2015-10-12: qty 4

## 2015-10-12 MED ORDER — DEXTROSE 5 % IV SOLN
2.0000 g | Freq: Once | INTRAVENOUS | Status: AC
  Administered 2015-10-12: 2 g via INTRAVENOUS
  Filled 2015-10-12: qty 2

## 2015-10-12 MED ORDER — ONDANSETRON HCL 4 MG/2ML IJ SOLN
4.0000 mg | Freq: Four times a day (QID) | INTRAMUSCULAR | Status: DC | PRN
  Administered 2015-10-12: 4 mg via INTRAVENOUS

## 2015-10-12 MED ORDER — SODIUM CHLORIDE 0.45 % IV SOLN
INTRAVENOUS | Status: DC
  Administered 2015-10-12: 100 mL/h via INTRAVENOUS

## 2015-10-12 MED ORDER — SODIUM CHLORIDE 0.9 % IV BOLUS (SEPSIS)
1000.0000 mL | INTRAVENOUS | Status: AC
  Administered 2015-10-12 (×2): 1000 mL via INTRAVENOUS

## 2015-10-12 MED ORDER — DOPAMINE-DEXTROSE 3.2-5 MG/ML-% IV SOLN
INTRAVENOUS | Status: AC
  Administered 2015-10-12: 800 mg
  Filled 2015-10-12: qty 250

## 2015-10-12 MED ORDER — VANCOMYCIN HCL IN DEXTROSE 1-5 GM/200ML-% IV SOLN
1000.0000 mg | Freq: Once | INTRAVENOUS | Status: AC
  Administered 2015-10-12: 1000 mg via INTRAVENOUS
  Filled 2015-10-12: qty 200

## 2015-10-12 MED ORDER — LEVOFLOXACIN IN D5W 500 MG/100ML IV SOLN
500.0000 mg | INTRAVENOUS | Status: DC
  Filled 2015-10-12: qty 100

## 2015-10-12 MED ORDER — SODIUM CHLORIDE 0.9 % IV BOLUS (SEPSIS)
500.0000 mL | INTRAVENOUS | Status: AC
  Administered 2015-10-12: 500 mL via INTRAVENOUS

## 2015-10-12 MED ORDER — PHENYLEPHRINE 40 MCG/ML (10ML) SYRINGE FOR IV PUSH (FOR BLOOD PRESSURE SUPPORT)
PREFILLED_SYRINGE | INTRAVENOUS | Status: AC
  Filled 2015-10-12: qty 10

## 2015-10-12 NOTE — Progress Notes (Signed)
eLink Physician-Brief Progress Note Patient Name: [name] DOB: 11/21/1965 MRN: [id number]   Date of Service  10/12/2015  HPI/Events of Note  Vomiting  eICU Interventions  Zofran IV     Intervention Category Minor Interventions: Other:  Storm Dulski 10/12/2015, 10:33 PM

## 2015-10-12 NOTE — ED Notes (Signed)
Decub at coccyx examined by Dr. Margreta JourneyGunalda. 6cm x 8 cm with at least 5-6 cm of depth. Granulation noted throughout.

## 2015-10-12 NOTE — ED Notes (Signed)
Updated family on pt.s status.  Dr. Radford PaxBeaton given  ECG

## 2015-10-12 NOTE — ED Notes (Signed)
Report given to Duard BradyEmily Olson, Rn

## 2015-10-12 NOTE — ED Notes (Signed)
Fluids placed on fluid bag,

## 2015-10-12 NOTE — ED Notes (Signed)
Reported to Dr. Radford PaxBeaton that pt's heart rate is Bradycardia.

## 2015-10-12 NOTE — H&P (Addendum)
PULMONARY / CRITICAL CARE MEDICINE   Name: [name] MRN: [id number] DOB: 1966/06/30    ADMISSION DATE:  10/12/2015 CONSULTATION DATE:  10/12/15  REFERRING MD:  EDP  CHIEF COMPLAINT:  AMS, hypotension, metabolic derangements  HISTORY OF PRESENT ILLNESS:  Pt is encephelopathic; therefore, this HPI is obtained from chart review. [name] is a 49 y.o. F with PMH as outlined below.  She is paraplegic following a ruptured cerebral aneurysm in 2014.  Since then, she has lived in SNF (multiple different ones since then, now at [facility] where she just moved last week).  She is also non-verbal at baseline.  She had apparently been in her USOH until 12/7 when she was less arouseable then usual.  She was profoundly hypotensive and she was found to have multiple metabolic derangements for which she was sent to [facility] ED for further evaluation.  In ED, she was hypotensive with SBP in 60's.  She was given 3L IVF with minimal improvement; therefore, she was started on levophed.  She also had Na of 163, SCr 3.15 (no hx renal insufficiency), BUN 113, lactate 2.37, Hgb 7.3, temp 94.48F.  UA suggestive of UTI.  CXR clear.  PCCM was consulted for admission.  PAST MEDICAL HISTORY :  She  has a past medical history of Hypertension; SAH (subarachnoid hemorrhage) (HCC); Seizure (HCC); Stroke [facility]); Tracheostomy status (HCC); Tracheomalacia; and DVT (deep venous thrombosis) (HCC).  PAST SURGICAL HISTORY: She  has past surgical history that includes Abdominal hysterectomy and Cholecystectomy.  Allergies  Allergen Reactions  . Amlodipine Swelling  . Penicillins Swelling    Has patient had a PCN reaction causing immediate rash, facial/tongue/throat swelling, SOB or lightheadedness with hypotension: Yes Has patient had a PCN reaction causing severe rash involving mucus membranes or skin necrosis: No Has patient had a PCN reaction that required hospitalization No Has patient had a PCN reaction  occurring within the last 10 years: No If all of the above answers are "NO", then may proceed with Cephalosporin use.  . Latex Rash    Unknown   . Other Other (See Comments)    Natural Rubber- Unknown     No current facility-administered medications on file prior to encounter.   Current Outpatient Prescriptions on File Prior to Encounter  Medication Sig  . acetaminophen (TYLENOL) 325 MG tablet Take 2 tablets (650 mg total) by mouth daily. (Patient taking differently: Take 650 mg by mouth daily as needed for moderate pain or fever. 2 tablets given every day, may take additional tablets PRN for fever above 100 F)  . Amino Acids-Protein Hydrolys (FEEDING SUPPLEMENT, PRO-STAT SUGAR FREE 64,) LIQD Take 30 mLs by mouth daily.  . Ascorbic Acid (VITAMIN C) 1000 MG tablet Take 1,000 mg by mouth daily.  . carboxymethylcellulose (REFRESH PLUS) 0.5 % SOLN Place 2 drops into both eyes 3 (three) times daily.  . Cholecalciferol (VITAMIN D3) 5000 UNITS TABS Take 5,000 Units by mouth daily with breakfast.   . cholestyramine light (PREVALITE) 4 G packet Take 4 g by mouth 3 (three) times daily. mix in 8 oz. Of Liquid  . cloNIDine (CATAPRES - DOSED IN MG/24 HR) 0.3 mg/24hr patch Place 1 patch (0.3 mg total) onto the skin once a week.  . famotidine (PEPCID) 40 MG/5ML suspension Take 2.5 mLs (20 mg total) by mouth every 12 (twelve) hours.  . feeding supplement, ENSURE ENLIVE, (ENSURE ENLIVE) LIQD Take 237 mLs by mouth 2 (two) times daily between meals. (Patient taking differently: Take  237 mLs by mouth 3 (three) times daily between meals. )  . ferrous sulfate 300 (60 FE) MG/5ML syrup Take 5 mLs (300 mg total) by mouth 2 (two) times daily with a meal.  . levETIRAcetam (KEPPRA) 750 MG tablet Take 1,500 mg by mouth 2 (two) times daily.  Marland Kitchen. lisinopril (PRINIVIL,ZESTRIL) 10 MG tablet Take 10 mg by mouth daily.  Marland Kitchen. loperamide (IMODIUM A-D) 2 MG tablet Take 4 mg by mouth 4 (four) times daily as needed for diarrhea or loose  stools.  . metoprolol (LOPRESSOR) 100 MG tablet Take 1 tablet (100 mg total) by mouth 2 (two) times daily.  . mirtazapine (REMERON) 7.5 MG tablet Take 7.5 mg by mouth at bedtime.  . Multiple Vitamin (MULTIVITAMIN WITH MINERALS) TABS tablet Take 1 tablet by mouth daily with breakfast.  . traZODone (DESYREL) 50 MG tablet Take 25 mg by mouth at bedtime as needed for sleep.   . collagenase (SANTYL) ointment Apply topically 2 (two) times daily. (Patient not taking: Reported on 10/12/2015)  . lacosamide (VIMPAT) 50 MG TABS tablet Take 2 tablets (100 mg total) by mouth 2 (two) times daily. (Patient not taking: Reported on 10/12/2015)  . potassium chloride SA (K-DUR,KLOR-CON) 20 MEQ tablet Take 2 tablets (40 mEq total) by mouth 2 (two) times daily. (Patient not taking: Reported on 10/12/2015)    FAMILY HISTORY:  Her indicated that her mother is deceased.   SOCIAL HISTORY: She  reports that she has quit smoking. Her smoking use included Cigarettes. She has never used smokeless tobacco. She reports that she does not drink alcohol or use illicit drugs.  REVIEW OF SYSTEMS:   Unable to obtain as pt is encephalopathic.  SUBJECTIVE:  Unresponsive.  Family at bedside, hx obtained from them. On 10mcg Levophed with SBP low 100's.  VITAL SIGNS: BP 96/70 mmHg  Pulse 50  Temp(Src) 94.2 F (34.6 C) (Rectal)  Resp 22  Ht 5\' 3"  (1.6 m)  Wt 79.833 kg (176 lb)  BMI 31.18 kg/m2  SpO2 100%  HEMODYNAMICS:    VENTILATOR SETTINGS:    INTAKE / OUTPUT: I/O last 3 completed shifts: In: 3000 [I.V.:3000] Out: 200 [Urine:200]   PHYSICAL EXAMINATION: General: Chronically ill appearing adult female, in NAD. Neuro: Opens eyes to noxious stimuli.  Does not follow commands.  (Hx paraplegia and non-verbal at baseline). HEENT: [place]/AT. PERRL. Cardiovascular: RRR, no M/R/G.  Lungs: Respirations even and unlabored.  CTA bilaterally, No W/R/R. Abdomen: BS x 4, soft, NT/ND.  Musculoskeletal: No gross deformities, no  edema.  Skin: Intact, warm, no rashes.  LABS:  BMET  Recent Labs Lab 10/12/15 1600  NA 163*  K 4.6  CL >130*  CO2 19*  BUN 113*  CREATININE 3.15*  GLUCOSE 144*    Electrolytes  Recent Labs Lab 10/12/15 1600  CALCIUM 8.6*    CBC  Recent Labs Lab 10/12/15 1600  WBC 9.4  HGB 7.3*  HCT 24.2*  PLT 279    Coag's No results for input(s): APTT, INR in the last 168 hours.  Sepsis Markers  Recent Labs Lab 10/12/15 1613  LATICACIDVEN 2.37*    ABG No results for input(s): PHART, PCO2ART, PO2ART in the last 168 hours.  Liver Enzymes  Recent Labs Lab 10/12/15 1600  AST 25  ALT 17  ALKPHOS 72  BILITOT 0.4  ALBUMIN 2.3*    Cardiac Enzymes No results for input(s): TROPONINI, PROBNP in the last 168 hours.  Glucose No results for input(s): GLUCAP in the last 168 hours.  Imaging Dg Chest Portable 1 View  10/12/2015  CLINICAL DATA:  Central line placement EXAM: PORTABLE CHEST 1 VIEW COMPARISON:  10/12/2015 FINDINGS: Cardiomediastinal silhouette is stable. No acute infiltrate or pleural effusion. No pulmonary edema. There is right IJ central line with tip in SVC right atrium junction. There is no pneumothorax. IMPRESSION: Right IJ central line with tip in SVC right atrium junction. No pneumothorax. Electronically Signed   By: Natasha Mead M.D.   On: 10/12/2015 18:42   Dg Chest Portable 1 View  10/12/2015  CLINICAL DATA:  Hypotension EXAM: PORTABLE CHEST 1 VIEW COMPARISON:  September 05, 2015 FINDINGS: There is no edema or consolidation. Heart size and pulmonary vascularity are normal. No adenopathy. There is cortical irregularity along the lateral aspect of the right scapula. IMPRESSION: No edema or consolidation. Cortical irregularity is noted along the lateral right scapula. Dedicated images of the right scapula advised to further assess. Electronically Signed   By: Bretta Bang III M.D.   On: 10/12/2015 16:21     STUDIES:  CXR 12/7 > no acute  process.  CULTURES: Blood 12/7 > Urine 12/7 > Sputum 12/7 > Sacral wound 12/7 >  ANTIBIOTICS: Vanc 12/7 > Aztreonam 12/7 >  SIGNIFICANT EVENTS: 12/7 > admitted with sepsis of unclear etiology as well as acute hypernatremia.  LINES/TUBES: R IJ 12/7 >  DISCUSSION: 49 y.o. F who is paraplegic following intracerebral aneursymal rupture 2014 and who since has resided in SNF, admitted with shock, presumed septic shock due to UTI, hypernatremia, and AMS.  ASSESSMENT / PLAN:  INFECTIOUS A:   Shock - presumed septic due to UTI. P:   Abx as above (Vanc / Aztreonam).  Follow cultures as above. PCT algorithm to limit abx exposure.  NEUROLOGIC A:   Acute encephalopathy - likely multifactorial in setting sepsis + hypernatremia + uremia. Hx seizure disorder. Hx paraplegia following ruptured intracerebral aneurysm 2014. P:   Monitor clinically for improvement as she is treated for above. Assess ammonia. Continue outpatient vimpat, keppra. Hold outpatient mirtazapine, trazodone.  CARDIOVASCULAR A:  Shock - presumed septic due to UTI. Hx HTN. P:  Levophed as needed to maintain goal MAP > 65. Goal CVP 8 - 12. Trend lactate. Assess cortisol. Hold outpatient clonidine, lisinopril, lopressor.  RENAL A:   Hypernatremia - likely due to dehydration. FWD ~ 6.5L. Uremia. AKI. P:   D5@ @ 125. BMP q2hrs - avoid rapid correction given unknown chronicity of her hypernatremia. Assess serum osmoles.  PULMONARY A: At risk aspiration given her AMS. P:   Pulmonary hygiene. NPO. CXR intermittently.  GASTROINTESTINAL A:   GERD. Nutrition. P:   Continue outpatient famotidine. NPO.  HEMATOLOGIC A:   Hx DVT - s/p filter per report by daughter.  Not on anticoagulation given her hx hematochezia. VTE Prophylaxis. P:  SCD's / Heparin. CBC in AM.  ENDOCRINE A:   At risk hyperglycemia given D5 infusion. P:   Monitor glucose on BMP.   Family updated: Daughter and sister  at bedside.  Interdisciplinary Family Meeting v Palliative Care Meeting:  Due by: 12/13.   Rutherford Guys, [place] [facility] Pager: [phone]  or [phone] 10/12/2015, 7:33 PM   STAFF NOTE: I, Rory Percy, MD FACP have personally reviewed patient's available data, including medical history, events of note, physical examination and test results as part of my evaluation. I have discussed with resident/NP and other care providers such as pharmacist, RN and RRT.  In addition, I personally evaluated patient and elicited key findings of: gcs 9, likely at baseline, no distress, no ronchi, no abdo pain, UA pos, arf, hypovolemia, septic shock, get renal US stat, assess hydro, she is high risk for res organisms, pcn allergy noted severe, would have preferred but imi, but not with that pcm reaction listed, add quinolone to aztreonam, continued vanc, I wil add sepsis protocol, yeast notged on smear, add empiric diflucan, volume !!, 1/2 NS, follow NA The patient is critically ill with multiple organ systems failure and requires high complexity decision making for assessment and support, frequent evaluation and titration of therapies, application of advanced monitoring technologies and extensive interpretation of multiple databases.   Critical Care Time devoted to patient care services described in this note is 30 Minutes. This time reflects time of care of this signee: Rory Percy, MD FACP. This critical care time does not reflect procedure time, or teaching time or supervisory time of PA/NP/Med student/Med Resident etc but could involve care discussion time. Rest per NP/medical resident whose note is outlined above and that I agree with   Mcarthur Rossetti. Tyson Alias, MD, FACP Pgr: [phone] [facility] 10/12/2015 8:54 PM   Sepsis - Repeat Assessment  Performed at:    830 pm 12/7  Vitals     Blood pressure 101/81, pulse 52, temperature  93.4 F (34.1 C), temperature source Rectal, resp. rate 20, height 5\' 3"  (1.6 m), weight 79.833 kg (176 lb), SpO2 100 %.  Heart:     Regular rate and rhythm  Lungs:    CTA  Capillary Refill:   <2 sec  Peripheral Pulse:   Radial pulse palpable  Skin:     Normal Color

## 2015-10-12 NOTE — ED Notes (Signed)
Pt arrives EMS with C/O abnormal labs and hypotension [facility]. Pt has previous ruptured cerebral aneurysm and is non verbal with no movement as baseline.

## 2015-10-12 NOTE — Progress Notes (Addendum)
ANTIBIOTIC CONSULT NOTE - INITIAL  Pharmacy Consult for vancomycin + aztreonam Indication: empiric  Allergies  Allergen Reactions  . Amlodipine Swelling  . Penicillins Swelling    Has patient had a PCN reaction causing immediate rash, facial/tongue/throat swelling, SOB or lightheadedness with hypotension: Yes Has patient had a PCN reaction causing severe rash involving mucus membranes or skin necrosis: No Has patient had a PCN reaction that required hospitalization No Has patient had a PCN reaction occurring within the last 10 years: No If all of the above answers are "NO", then may proceed with Cephalosporin use.  . Latex Rash    Unknown   . Other Other (See Comments)    Natural Rubber- Unknown     Patient Measurements: Height:  (160 cm) Weight: 176 lb (79.833 kg) IBW/kg (Calculated) : 52.4 Adjusted Body Weight:   Vital Signs: Temp: 94.2 F (34.6 C) (12/07 1602) Temp Source: Rectal (12/07 1602) BP: 101/71 mmHg (12/07 1930) Pulse Rate: 43 (12/07 1930) Intake/Output from previous day:   Intake/Output from this shift:    Labs:  Recent Labs  10/12/15 1600  WBC 9.4  HGB 7.3*  PLT 279  CREATININE 3.15*   Estimated Creatinine Clearance: 21.9 mL/min (by C-G formula based on Cr of 3.15). No results for input(s): VANCOTROUGH, VANCOPEAK, VANCORANDOM, GENTTROUGH, GENTPEAK, GENTRANDOM, TOBRATROUGH, TOBRAPEAK, TOBRARND, AMIKACINPEAK, AMIKACINTROU, AMIKACIN in the last 72 hours.   Microbiology: No results found for this or any previous visit (from the past 720 hour(s)).  Medical History: Past Medical History  Diagnosis Date  . Hypertension   . SAH (subarachnoid hemorrhage) (HCC)   . Seizure (HCC)   . Stroke (HCC)   . Tracheostomy status (HCC)   . Tracheomalacia   . DVT (deep venous thrombosis) (HCC)     Medications:  Anti-infectives    Start     Dose/Rate Route Frequency Ordered Stop   10/13/15 0330  aztreonam (AZACTAM) 1 g in dextrose 5 % 50 mL IVPB     1  g 100 mL/hr over 30 Minutes Intravenous Every 8 hours 10/12/15 1959     10/12/15 2000  vancomycin (VANCOCIN) 500 mg in sodium chloride 0.9 % 100 mL IVPB     500 mg 100 mL/hr over 60 Minutes Intravenous  Once 10/12/15 1957     10/12/15 1715  levofloxacin (LEVAQUIN) IVPB 750 mg     750 mg 100 mL/hr over 90 Minutes Intravenous  Once 10/12/15 1713 10/12/15 1906   10/12/15 1715  aztreonam (AZACTAM) 2 g in dextrose 5 % 50 mL IVPB     2 g 100 mL/hr over 30 Minutes Intravenous  Once 10/12/15 1713     10/12/15 1715  vancomycin (VANCOCIN) IVPB 1000 mg/200 mL premix     1,000 mg 200 mL/hr over 60 Minutes Intravenous  Once 10/12/15 1713 10/12/15 1906     Assessment: 49 yof presented to the ED from her nursing home with AMS and hypotension. To start empiric vancomycin + aztreonam. Pt is hypothermic and WBC is WNL. Lactic is elevated at 2.37. Scr is elevated at 3.15. First doses ordered by EDP.   Vanc 12/7>> Aztreonam 12/7>> Levaquin x 1 12/7  Goal of Therapy:  Vancomycin trough level 15-20 mcg/ml  Plan:  - Aztreonam 1gm IV Q8H - Give additional vancomycin  IV x 1 to make a total loading dose of  - F/u Scr trends for further doses - F/u renal fxn, C&S, clinical status and trough at [facility]  Rumbarger, Drake Leach 10/12/2015,7:59 PM  ADDN: Pharmacy is consulted to dose levaquin and fluconazole for UTI and possible fungemia. Pt received azactam 2g and levaquin 750mg  IV once in the ED.   Plan: Fluconazole 200mg  IV q24h Levaquin 750mg  IV once followed by 500mg  q48h  Arlean Hoppingorey M. Newman PiesBall, PharmD, BCPS Clinical Pharmacist Pager [phone]

## 2015-10-12 NOTE — ED Provider Notes (Signed)
CSN: [id number]     Arrival date & time 10/12/15  1531 History   First MD Initiated Contact with Patient 10/12/15 1536     Chief Complaint  Patient presents with  . Abnormal Lab  . Hypotension    Patient is a 49 y.o. female presenting with general illness. The history is provided by a relative and the EMS personnel.  Illness   Past Medical History  Diagnosis Date  . Hypertension   . SAH (subarachnoid hemorrhage) (HCC)   . Seizure (HCC)   . Stroke (HCC)   . Tracheostomy status (HCC)   . Tracheomalacia   . DVT (deep venous thrombosis) [facility])    Past Surgical History  Procedure Laterality Date  . Abdominal hysterectomy    . Cholecystectomy     Family History  Problem Relation Age of Onset  . Heart disease Mother 57    MI   Social History  Substance Use Topics  . Smoking status: Former Smoker    Types: Cigarettes  . Smokeless tobacco: Never Used     Comment: trying to quit-2 cigarettes daily  . Alcohol Use: No     Comment: Very seldom   OB History    No data available     Review of Systems    Allergies  Amlodipine; Penicillins; Latex; and Other  Home Medications   Prior to Admission medications   Medication Sig Start Date End Date Taking? Authorizing Provider  acetaminophen (TYLENOL) 325 MG tablet Take 2 tablets (650 mg total) by mouth daily. Patient taking differently: Take 650 mg by mouth daily as needed for moderate pain or fever. 2 tablets given every day, may take additional tablets PRN for fever above 100 F 03/22/15  Yes Hollice Espy, MD  Amino Acids-Protein Hydrolys (FEEDING SUPPLEMENT, PRO-STAT SUGAR FREE 64,) LIQD Take 30 mLs by mouth daily.   Yes Historical Provider, MD  Ascorbic Acid (VITAMIN C) 1000 MG tablet Take 1,000 mg by mouth daily.   Yes Historical Provider, MD  carboxymethylcellulose (REFRESH PLUS) 0.5 % SOLN Place 2 drops into both eyes 3 (three) times daily.   Yes Historical Provider, MD  Cholecalciferol (VITAMIN D3) 5000 UNITS TABS  Take 5,000 Units by mouth daily with breakfast.    Yes Historical Provider, MD  cholestyramine light (PREVALITE) 4 G packet Take 4 g by mouth 3 (three) times daily. mix in 8 oz. Of Liquid   Yes Historical Provider, MD  cloNIDine (CATAPRES - DOSED IN MG/24 HR) 0.3 mg/24hr patch Place 1 patch (0.3 mg total) onto the skin once a week. 06/06/15  Yes Renae Fickle, MD  famotidine (PEPCID) 40 MG/5ML suspension Take 2.5 mLs (20 mg total) by mouth every 12 (twelve) hours. 03/22/15  Yes Hollice Espy, MD  feeding supplement, ENSURE ENLIVE, (ENSURE ENLIVE) LIQD Take 237 mLs by mouth 2 (two) times daily between meals. Patient taking differently: Take 237 mLs by mouth 3 (three) times daily between meals.  06/06/15  Yes Renae Fickle, MD  ferrous sulfate 300 (60 FE) MG/5ML syrup Take 5 mLs (300 mg total) by mouth 2 (two) times daily with a meal. 09/06/15  Yes Kathlen Mody, MD  Lacosamide (VIMPAT) 100 MG TABS Take 100 mg by mouth 2 (two) times daily.   Yes Historical Provider, MD  levETIRAcetam (KEPPRA) 750 MG tablet Take 1,500 mg by mouth 2 (two) times daily.   Yes Historical Provider, MD  lisinopril (PRINIVIL,ZESTRIL) 10 MG tablet Take 10 mg by mouth daily.   Yes Historical Provider,  MD  loperamide (IMODIUM A-D) 2 MG tablet Take 4 mg by mouth 4 (four) times daily as needed for diarrhea or loose stools.   Yes Historical Provider, MD  metoprolol (LOPRESSOR) 100 MG tablet Take 1 tablet (100 mg total) by mouth 2 (two) times daily. 03/22/15  Yes Hollice Espy, MD  mirtazapine (REMERON) 7.5 MG tablet Take 7.5 mg by mouth at bedtime.   Yes Historical Provider, MD  Multiple Vitamin (MULTIVITAMIN WITH MINERALS) TABS tablet Take 1 tablet by mouth daily with breakfast.   Yes Historical Provider, MD  traZODone (DESYREL) 50 MG tablet Take 25 mg by mouth at bedtime as needed for sleep.    Yes Historical Provider, MD  collagenase (SANTYL) ointment Apply topically 2 (two) times daily. Patient not taking: Reported on  10/12/2015 09/06/15   Kathlen Mody, MD  lacosamide (VIMPAT) 50 MG TABS tablet Take 2 tablets (100 mg total) by mouth 2 (two) times daily. Patient not taking: Reported on 10/12/2015 06/06/15   Renae Fickle, MD  potassium chloride SA (K-DUR,KLOR-CON) 20 MEQ tablet Take 2 tablets (40 mEq total) by mouth 2 (two) times daily. Patient not taking: Reported on 10/12/2015 09/06/15   Kathlen Mody, MD   BP 103/90 mmHg  Pulse 65  Temp(Src) 94.2 F (34.6 C) (Rectal)  Resp 31  Ht  (1.6 m)  Wt 79.833 kg  BMI 31.18 kg/m2  SpO2 100% Physical Exam  Constitutional: She is oriented to person, place, and time.  Chronically ill-appearing, nonverbal, paraparetic  HENT:  Head: Normocephalic and atraumatic.  Mouth/Throat: Oropharynx is clear and moist.  Old trach scar  Eyes: EOM are normal. Pupils are equal, round, and reactive to light.  Neck: Neck supple. No JVD present.  Cardiovascular: Normal rate, regular rhythm, normal heart sounds and intact distal pulses.  Exam reveals no gallop.   No murmur heard. Pulmonary/Chest: Effort normal and breath sounds normal. She has no wheezes. She has no rales.  Abdominal: Soft. She exhibits no distension. There is no tenderness.  Old gastrostomy scar  Musculoskeletal: Normal range of motion. She exhibits no tenderness.  Neurological: She is alert and oriented to person, place, and time. No cranial nerve deficit. GCS eye subscore is 4. GCS verbal subscore is 1. GCS motor subscore is 4.  Skin: Skin is warm and dry. No rash noted. There is pallor.  Psychiatric: Her behavior is normal.    ED Course  .Central Line Date/Time: 10/13/2015 1:27 AM Performed by: Maris Berger Authorized by: Maris Berger Consent: Written consent obtained. Risks and benefits: risks, benefits and alternatives were discussed Consent given by: guardian Patient understanding: patient states understanding of the procedure being performed Patient consent: the patient's understanding of the  procedure matches consent given Procedure consent: procedure consent matches procedure scheduled Relevant documents: relevant documents present and verified Site marked: the operative site was marked Imaging studies: imaging studies available Required items: required blood products, implants, devices, and special equipment available Patient identity confirmed: arm band and hospital-assigned identification number Time out: Immediately prior to procedure a "time out" was called to verify the correct patient, procedure, equipment, support staff and site/side marked as required. Indications: vascular access Anesthesia: local infiltration Local anesthetic: lidocaine 1% without epinephrine Patient sedated: no Preparation: skin prepped with ChloraPrep Skin prep agent dried: skin prep agent completely dried prior to procedure Sterile barriers: all five maximum sterile barriers used - cap, mask, sterile gown, sterile gloves, and large sterile sheet Hand hygiene: hand hygiene performed prior to central venous catheter  insertion Location details: right internal jugular Patient position: Trendelenburg Catheter type: triple lumen Pre-procedure: landmarks identified Ultrasound guidance: yes Sterile ultrasound techniques: sterile gel and sterile probe covers were used Number of attempts: 1 Successful placement: yes Post-procedure: line sutured and dressing applied Assessment: blood return through all ports,  free fluid flow,  placement verified by x-ray and no pneumothorax on x-ray Patient tolerance: Patient tolerated the procedure well with no immediate complications    Labs Review Labs Reviewed  CBC WITH DIFFERENTIAL/PLATELET - Abnormal; Notable for the following:    RBC 2.61 (*)    Hemoglobin 7.3 (*)    HCT 24.2 (*)    RDW 19.0 (*)    All other components within normal limits  COMPREHENSIVE METABOLIC PANEL - Abnormal; Notable for the following:    Sodium 163 (*)    Chloride >130 (*)     CO2 19 (*)    Glucose, Bld 144 (*)    BUN 113 (*)    Creatinine, Ser 3.15 (*)    Calcium 8.6 (*)    Total Protein 6.3 (*)    Albumin 2.3 (*)    GFR calc non Af Amer 16 (*)    GFR calc Af Amer 19 (*)    All other components within normal limits  URINALYSIS, ROUTINE W REFLEX MICROSCOPIC (NOT AT [facility]) - Abnormal; Notable for the following:    Color, Urine AMBER (*)    APPearance TURBID (*)    Ketones, ur 15 (*)    Protein, ur 100 (*)    Leukocytes, UA LARGE (*)    All other components within normal limits  OSMOLALITY - Abnormal; Notable for the following:    Osmolality 364 (*)    All other components within normal limits  URINE MICROSCOPIC-ADD ON - Abnormal; Notable for the following:    Squamous Epithelial / LPF 6-30 (*)    Bacteria, UA MANY (*)    Casts GRANULAR CAST (*)    Crystals CA OXALATE CRYSTALS (*)    All other components within normal limits  LACTIC ACID, PLASMA - Abnormal; Notable for the following:    Lactic Acid, Venous 2.1 (*)    All other components within normal limits  BASIC METABOLIC PANEL - Abnormal; Notable for the following:    Sodium 159 (*)    Chloride >130 (*)    CO2 16 (*)    Glucose, Bld 180 (*)    BUN 98 (*)    Creatinine, Ser 2.58 (*)    Calcium 7.5 (*)    GFR calc non Af Amer 21 (*)    GFR calc Af Amer 24 (*)    All other components within normal limits  BASIC METABOLIC PANEL - Abnormal; Notable for the following:    Sodium 158 (*)    Chloride >130 (*)    CO2 16 (*)    Glucose, Bld 151 (*)    BUN 90 (*)    Creatinine, Ser 2.17 (*)    Calcium 7.1 (*)    GFR calc non Af Amer 26 (*)    GFR calc Af Amer 30 (*)    All other components within normal limits  GLUCOSE, CAPILLARY - Abnormal; Notable for the following:    Glucose-Capillary 140 (*)    All other components within normal limits  COMPREHENSIVE METABOLIC PANEL - Abnormal; Notable for the following:    Sodium 157 (*)    Chloride >130 (*)    CO2 16 (*)    Glucose, Bld 192 (*)  BUN  97 (*)    Creatinine, Ser 2.51 (*)    Calcium 7.6 (*)    Total Protein 5.7 (*)    Albumin 2.0 (*)    GFR calc non Af Amer 22 (*)    GFR calc Af Amer 25 (*)    All other components within normal limits  I-STAT CG4 LACTIC ACID, ED - Abnormal; Notable for the following:    Lactic Acid, Venous 2.37 (*)    All other components within normal limits  POCT I-STAT 3, ART BLOOD GAS (G3+) - Abnormal; Notable for the following:    pH, Arterial 7.333 (*)    pCO2 arterial 24.5 (*)    Bicarbonate 13.3 (*)    Acid-base deficit 12.0 (*)    All other components within normal limits  MRSA PCR SCREENING  URINE CULTURE  CULTURE, BLOOD (ROUTINE X 2)  CULTURE, BLOOD (ROUTINE X 2)  WOUND CULTURE  CULTURE, EXPECTORATED SPUTUM-ASSESSMENT  LIPASE, BLOOD  OSMOLALITY, URINE  LACTIC ACID, PLASMA  PROCALCITONIN  AMMONIA  CORTISOL  CBC  MAGNESIUM  PHOSPHORUS  BASIC METABOLIC PANEL  BASIC METABOLIC PANEL  BASIC METABOLIC PANEL  PROCALCITONIN  BLOOD GAS, ARTERIAL  I-STAT TROPOININ, ED  I-STAT CHEM 8, ED  TYPE AND SCREEN  ABO/RH    Imaging Review Dg Chest Portable 1 View  10/12/2015  CLINICAL DATA:  Central line placement EXAM: PORTABLE CHEST 1 VIEW COMPARISON:  10/12/2015 FINDINGS: Cardiomediastinal silhouette is stable. No acute infiltrate or pleural effusion. No pulmonary edema. There is right IJ central line with tip in SVC right atrium junction. There is no pneumothorax. IMPRESSION: Right IJ central line with tip in SVC right atrium junction. No pneumothorax. Electronically Signed   By: Natasha MeadLiviu  Pop M.D.   On: 10/12/2015 18:42   Dg Chest Portable 1 View  10/12/2015  CLINICAL DATA:  Hypotension EXAM: PORTABLE CHEST 1 VIEW COMPARISON:  September 05, 2015 FINDINGS: There is no edema or consolidation. Heart size and pulmonary vascularity are normal. No adenopathy. There is cortical irregularity along the lateral aspect of the right scapula. IMPRESSION: No edema or consolidation. Cortical irregularity is  noted along the lateral right scapula. Dedicated images of the right scapula advised to further assess. Electronically Signed   By: Bretta BangWilliam  Woodruff III M.D.   On: 10/12/2015 16:21   I have personally reviewed and evaluated these images and lab results as part of my medical decision-making.   EKG Interpretation   Date/Time:  Wednesday October 12 2015 15:36:15 EST Ventricular Rate:  64 PR Interval:  150 QRS Duration: 114 QT Interval:  451 QTC Calculation: 465 R Axis:   17 Text Interpretation:  Sinus rhythm Abnormal R-wave progression, early  transition Left ventricular hypertrophy Confirmed by Radford PaxBEATON  MD, ROBERT  ([id number]) on 10/12/2015 5:47:48 PM      MDM   Final diagnoses:  Acute renal failure (ARF) (HCC)    Patient is a 49 year old American female with a history of ruptured subarachnoid hemorrhage which has left her nonverbal and paraparetic he presents by EMS for abnormal labs and hypertension. Per EMS patient had blood pressure of 60s over 40s. However she is very fluid responsive with less than 1 L normal saline with improvement pressures to systolics of 140. Initial manual pressure her e is 60s over 40s. Patient was placed in Trendelenburg and bolused with an initial liter of fluid. Patient's abnormal labs occluded hypernatremia with a sodium of 170 and hyperkalemia. We will repeat these lab values and initiate workup for  her hypertension.   5:28 PM still hypotensive after 3L NS boluses. Consented son for central line. Phenylephrine given to improve BP - minimal result.  EKG today shows new T-wave inversions in V2 and V3 that were not there previously. Hemoglobin is 7.3. I-STAT chemistry shows sodium of 169, chloride 135, creatinine 2.8.  Central and was placed in the right IJ using ultrasound guidance. Patient started on norepinephrine drip. Her sugar improved. Patient again noted to be hyper atrium. Patient started on D5 water infusion at 100 mL per hour. Critical care  consulted for admission for refractory hypotension and metabolic derangements. Admitted to ICU.  Discussed with Dr. Radford Pax.  Maris Berger, MD 10/13/15 0130  Nelva Nay, MD 10/13/15 2330

## 2015-10-12 NOTE — ED Notes (Signed)
Attempted report 

## 2015-10-12 NOTE — ED Notes (Signed)
Dr. Radford PaxBeaton verified central line placement prior to using.

## 2015-10-13 ENCOUNTER — Inpatient Hospital Stay (HOSPITAL_COMMUNITY): Payer: 59

## 2015-10-13 DIAGNOSIS — N171 Acute kidney failure with acute cortical necrosis: Secondary | ICD-10-CM

## 2015-10-13 DIAGNOSIS — L8990 Pressure ulcer of unspecified site, unspecified stage: Secondary | ICD-10-CM

## 2015-10-13 DIAGNOSIS — N390 Urinary tract infection, site not specified: Secondary | ICD-10-CM

## 2015-10-13 DIAGNOSIS — R6521 Severe sepsis with septic shock: Secondary | ICD-10-CM

## 2015-10-13 LAB — BASIC METABOLIC PANEL
BUN: 80 mg/dL — ABNORMAL HIGH (ref 6–20)
BUN: 77 mg/dL — ABNORMAL HIGH (ref 6–20)
BUN: 69 mg/dL — ABNORMAL HIGH (ref 6–20)
BUN: 83 mg/dL — AB (ref 6–20)
BUN: 90 mg/dL — AB (ref 6–20)
CALCIUM: 7 mg/dL — AB (ref 8.9–10.3)
CALCIUM: 7 mg/dL — AB (ref 8.9–10.3)
CALCIUM: 7.1 mg/dL — AB (ref 8.9–10.3)
CO2: 14 mmol/L — AB (ref 22–32)
CO2: 14 mmol/L — AB (ref 22–32)
CO2: 15 mmol/L — ABNORMAL LOW (ref 22–32)
CO2: 15 mmol/L — ABNORMAL LOW (ref 22–32)
CO2: 16 mmol/L — ABNORMAL LOW (ref 22–32)
CREATININE: 1.95 mg/dL — AB (ref 0.44–1.00)
CREATININE: 1.89 mg/dL — AB (ref 0.44–1.00)
CREATININE: 1.52 mg/dL — AB (ref 0.44–1.00)
Calcium: 7 mg/dL — ABNORMAL LOW (ref 8.9–10.3)
Calcium: 7.4 mg/dL — ABNORMAL LOW (ref 8.9–10.3)
Chloride: >130 mmol/L (ref 101–111)
Chloride: >130 mmol/L (ref 101–111)
Chloride: >130 mmol/L (ref 101–111)
Creatinine, Ser: 1.81 mg/dL — ABNORMAL HIGH (ref 0.44–1.00)
Creatinine, Ser: 2.17 mg/dL — ABNORMAL HIGH (ref 0.44–1.00)
GFR calc Af Amer: 34 mL/min — ABNORMAL LOW (ref >60)
GFR calc non Af Amer: 39 mL/min — ABNORMAL LOW (ref >60)
GFR, EST AFRICAN AMERICAN: 46 mL/min — AB (ref >60)
GFR, EST AFRICAN AMERICAN: 30 mL/min — AB (ref >60)
GFR, EST AFRICAN AMERICAN: 35 mL/min — AB (ref >60)
GFR, EST AFRICAN AMERICAN: 37 mL/min — AB (ref >60)
GFR, EST NON AFRICAN AMERICAN: 26 mL/min — AB (ref >60)
GFR, EST NON AFRICAN AMERICAN: 29 mL/min — AB (ref >60)
GFR, EST NON AFRICAN AMERICAN: 30 mL/min — AB (ref >60)
GFR, EST NON AFRICAN AMERICAN: 32 mL/min — AB (ref >60)
GLUCOSE: 151 mg/dL — AB (ref 65–99)
Glucose, Bld: 109 mg/dL — ABNORMAL HIGH (ref 65–99)
Glucose, Bld: 103 mg/dL — ABNORMAL HIGH (ref 65–99)
Glucose, Bld: 102 mg/dL — ABNORMAL HIGH (ref 65–99)
Glucose, Bld: 107 mg/dL — ABNORMAL HIGH (ref 65–99)
POTASSIUM: 3.7 mmol/L (ref 3.5–5.1)
POTASSIUM: 4.3 mmol/L (ref 3.5–5.1)
Potassium: 4.6 mmol/L (ref 3.5–5.1)
Potassium: 4.2 mmol/L (ref 3.5–5.1)
Potassium: 3.8 mmol/L (ref 3.5–5.1)
SODIUM: 157 mmol/L — AB (ref 135–145)
SODIUM: 155 mmol/L — AB (ref 135–145)
SODIUM: 156 mmol/L — AB (ref 135–145)
Sodium: 158 mmol/L — ABNORMAL HIGH (ref 135–145)
Sodium: 158 mmol/L — ABNORMAL HIGH (ref 135–145)

## 2015-10-13 LAB — CBC
HEMATOCRIT: 22.4 % — AB (ref 36.0–46.0)
HEMOGLOBIN: 7 g/dL — AB (ref 12.0–15.0)
MCH: 28.7 pg (ref 26.0–34.0)
MCHC: 31.3 g/dL (ref 30.0–36.0)
MCV: 91.8 fL (ref 78.0–100.0)
Platelets: 254 10*3/uL (ref 150–400)
RBC: 2.44 MIL/uL — ABNORMAL LOW (ref 3.87–5.11)
RDW: 18.8 % — AB (ref 11.5–15.5)
WBC: 8.4 10*3/uL (ref 4.0–10.5)

## 2015-10-13 LAB — MRSA PCR SCREENING: MRSA by PCR: NEGATIVE

## 2015-10-13 LAB — URINE CULTURE

## 2015-10-13 LAB — PROCALCITONIN: PROCALCITONIN: 0.28 ng/mL

## 2015-10-13 LAB — MAGNESIUM: MAGNESIUM: 1.7 mg/dL (ref 1.7–2.4)

## 2015-10-13 LAB — LACTIC ACID, PLASMA: LACTIC ACID, VENOUS: 1.1 mmol/L (ref 0.5–2.0)

## 2015-10-13 LAB — PHOSPHORUS: PHOSPHORUS: 1.9 mg/dL — AB (ref 2.5–4.6)

## 2015-10-13 MED ORDER — VANCOMYCIN HCL IN DEXTROSE 1-5 GM/200ML-% IV SOLN
1000.0000 mg | INTRAVENOUS | Status: DC
  Administered 2015-10-13 – 2015-10-15 (×3): 1000 mg via INTRAVENOUS
  Filled 2015-10-13 (×4): qty 200

## 2015-10-13 MED ORDER — SODIUM CHLORIDE 0.9 % IV BOLUS (SEPSIS)
500.0000 mL | Freq: Once | INTRAVENOUS | Status: AC
  Administered 2015-10-13: 500 mL via INTRAVENOUS

## 2015-10-13 MED ORDER — CETYLPYRIDINIUM CHLORIDE 0.05 % MT LIQD
7.0000 mL | Freq: Two times a day (BID) | OROMUCOSAL | Status: DC
Start: 2015-10-13 — End: 2015-10-14
  Administered 2015-10-13 – 2015-10-14 (×3): 7 mL via OROMUCOSAL

## 2015-10-13 MED ORDER — ENSURE ENLIVE PO LIQD
237.0000 mL | Freq: Three times a day (TID) | ORAL | Status: DC
  Administered 2015-10-13 – 2015-10-18 (×12): 237 mL via ORAL

## 2015-10-13 MED ORDER — ADULT MULTIVITAMIN W/MINERALS CH
1.0000 | ORAL_TABLET | Freq: Every day | ORAL | Status: DC
  Administered 2015-10-13 – 2015-10-26 (×13): 1 via ORAL
  Filled 2015-10-13 (×13): qty 1

## 2015-10-13 MED ORDER — CHLORHEXIDINE GLUCONATE 0.12 % MT SOLN
15.0000 mL | Freq: Two times a day (BID) | OROMUCOSAL | Status: DC
  Administered 2015-10-13 – 2015-10-14 (×4): 15 mL via OROMUCOSAL

## 2015-10-13 MED ORDER — DEXTROSE 5 % IV SOLN
INTRAVENOUS | Status: AC
  Administered 2015-10-13 – 2015-10-15 (×3): via INTRAVENOUS

## 2015-10-13 MED ORDER — BOOST / RESOURCE BREEZE PO LIQD: 1.0000 | Freq: Three times a day (TID) | ORAL | Status: DC

## 2015-10-13 MED ORDER — ACETAMINOPHEN 325 MG PO TABS
650.0000 mg | ORAL_TABLET | Freq: Four times a day (QID) | ORAL | Status: DC | PRN
  Administered 2015-10-13 – 2015-10-21 (×6): 650 mg via ORAL
  Filled 2015-10-13 (×7): qty 2

## 2015-10-13 NOTE — Progress Notes (Signed)
Pharmacy Antibiotic Time-Out Note  Donald PoreLaura Marie Lawson is a 49 y.o. year-old female admitted on 10/12/2015.  The patient is currently on Vancomycin / Levaquin / Fluconazole for sepsis due to probable UTI.  Assessment/Plan: Day #1 of empiric abx for sepsis due to probable UTI. Afebrile, WBC wnl. Scr elevated but down to 1.81 (peaked at 2.51) (baseline ~0.5) CrCl ~5140ml/min. UOP 950 ml yesterday.  Plan: Continue Levaquin 500mg  q48h Start vancomycin 1g IV Q24 tonight at 1800 Continue fluconazole 200mg  IV q24h Monitor clinical picture, renal function, VT prn F/U C&S, abx deescalation / LOT  Temp (24hrs), Avg:96.6 F (35.9 C), Min:93.4 F (34.1 C), Max:99.1 F (37.3 C)   Recent Labs Lab 10/12/15 1600 10/13/15 0450  WBC 9.4 8.4    Recent Labs Lab 10/12/15 2145 10/12/15 2330 10/13/15 0215 10/13/15 0425 10/13/15 0530  CREATININE 2.51* 2.17* 1.95* 1.89* 1.81*   Estimated Creatinine Clearance: 38.2 mL/min (by C-G formula based on Cr of 1.81).    Antimicrobial allergies: PCN allergy (swelling)  Antimicrobials this admission: Vanc 12/7 >> Aztreonam 12/7 x 1 Fluc 12/7 >> Levaquin 12/7 >>  Microbiology Results: Wound cx 12/8 > sent Blood cx 12/7 > sent Urine cx 12/7 > multiple species, suggest recollect  Thank you for allowing pharmacy to be a part of this patient's care.  Enzo BiNathan Oakland Fant, PharmD, BCPS Clinical Pharmacist Pager [phone] 10/13/2015 10:18 AM

## 2015-10-13 NOTE — Progress Notes (Signed)
Initial Nutrition Assessment  DOCUMENTATION CODES:   Obesity unspecified  INTERVENTION:    Boost Breeze PO TID, each supplement provides 250 kcal and 9 grams of protein, change to Ensure Enlive PO TID when diet advanced, each supplement provides 350 kcal and 20 grams of protein  NUTRITION DIAGNOSIS:   Increased nutrient needs related to wound healing as evidenced by estimated needs.  GOAL:   Patient will meet greater than or equal to 90% of their needs  MONITOR:   PO intake, Diet advancement, Supplement acceptance, Labs, Weight trends, Skin  REASON FOR ASSESSMENT:   Low Braden    ASSESSMENT:   49 y.o. F who is paraplegic following a ruptured cerebral aneurysm in 2014. Since then, she has lived in SNF (multiple different ones since then, [facility] She is also non-verbal at baseline.She had apparently been in her USOH until 12/7 when she was less arouseable then usual. She was profoundly hypotensive and she was found to have multiple metabolic derangements for which [facility].   Labs reviewed: sodium elevated, phosphorus low  Unable to complete nutrition focused physical exam at this time. Patient with increased protein needs to support wound healing. Discussed patient in ICU rounds and with RN today. She usually takes a PO diet, does not have a PEG. She requires assistance with feeding, is not able to feed herself. Plans for diet advancement today.   Diet Order:  Diet clear liquid Room service appropriate?: Yes; Fluid consistency:: Thin  Skin:  Wound (see comment) (stg 1 R hip; stg IV sacrum; DTI R & L foot)  Last BM:  12/7  Height:   Ht Readings from Last 1 Encounters:  10/12/15 5\' 3"  (1.6 m)    Weight:   Wt Readings from Last 1 Encounters:  10/13/15 177 lb 14.6 oz (80.7 kg)    Ideal Body Weight:  52.3 kg  BMI:  Body mass index is 31.52 kg/(m^2).  Estimated Nutritional Needs:    Kcal:  1700-1900  Protein:  115-130 gm  Fluid:  1.8-2 L  EDUCATION NEEDS:   No education needs identified at this time  Joaquin CourtsKimberly Harris, RD, LDN, CNSC Pager [phone] After Hours Pager [phone]

## 2015-10-13 NOTE — Progress Notes (Signed)
PULMONARY / CRITICAL CARE MEDICINE   Name: [name] MRN: [id number] DOB: 04/28/1966    ADMISSION DATE:  10/12/2015 CONSULTATION DATE:  10/12/15  REFERRING MD:  EDP  CHIEF COMPLAINT:  AMS, hypotension, metabolic derangements  Brief history :  49 y/o paraplegic who lives in a SNF was admitted on 12/7 with sepsis due to a UTI.  Non-verbal at baseline, presented to our ER with stage IV sacral decubitus ulcer.    SUBJECTIVE:  Levophed requirement improving Remains non-verbal     VITAL SIGNS: BP 81/54 mmHg  Pulse 98  Temp(Src) 99.1 F (37.3 C) (Oral)  Resp 31  Ht 5\' 3"  (1.6 m)  Wt 80.7 kg (177 lb 14.6 oz)  BMI 31.52 kg/m2  SpO2 99%  HEMODYNAMICS: CVP:  [3 mmHg-8 mmHg] 8 mmHg  VENTILATOR SETTINGS:    INTAKE / OUTPUT: I/O last 3 completed shifts: In: 7272 [I.V.:4472; IV Piggyback:2800] Out: 950 [Urine:950]   PHYSICAL EXAMINATION:  Gen: chronically ill appearing HENT: OP clear, neck supple PULM: CTA B, normal effort CV: RRR, no mgr, trace edema GI: BS+, soft, nontender Derm: stage IV decubitus ulcer, burn-like lesions over both feet (pressure type from shoes?) Neuro: discordant guaze, tracks, doesn't follow commands, winces to pain   LABS:  BMET  Recent Labs Lab 10/13/15 0215 10/13/15 0425 10/13/15 0530  NA 157* 155* 156*  K 4.6 4.2 4.3  CL >130* >130* >130*  CO2 15* 14* 15*  BUN 83* 80* 77*  CREATININE 1.95* 1.89* 1.81*  GLUCOSE 109* 103* 102*    Electrolytes  Recent Labs Lab 10/13/15 0215 10/13/15 0425 10/13/15 0450 10/13/15 0530  CALCIUM 7.0* 7.0*  --  7.0*  MG  --   --  1.7  --   PHOS  --   --  1.9*  --     CBC  Recent Labs Lab 10/12/15 1600 10/13/15 0450  WBC 9.4 8.4  HGB 7.3* 7.0*  HCT 24.2* 22.4*  PLT 279 254    Coag's No results for input(s): APTT, INR in the last 168 hours.  Sepsis Markers  Recent Labs Lab 10/12/15 1613 10/12/15 2030 10/12/15 2330 10/13/15 0450  LATICACIDVEN 2.37* 2.1* 1.1  --    PROCALCITON  --  0.25  --  0.28    ABG  Recent Labs Lab 10/12/15 2216  PHART 7.333*  PCO2ART 24.5*  PO2ART 99.0    Liver Enzymes  Recent Labs Lab 10/12/15 1600 10/12/15 2145  AST 25 16  ALT 17 19  ALKPHOS 72 69  BILITOT 0.4 0.3  ALBUMIN 2.3* 2.0*    Cardiac Enzymes No results for input(s): TROPONINI, PROBNP in the last 168 hours.  Glucose  Recent Labs Lab 10/12/15 2058  GLUCAP 140*    Imaging 12/8 CXR port > images personally reviewed> clear lungs, low volumes, IJ CVL in place   STUDIES:  CXR 12/7 > no acute process  CULTURES: Blood 12/7 > Urine 12/7 > multiple species Sputum 12/7 > Sacral wound 12/7 >  ANTIBIOTICS: Vanc 12/7 > Aztreonam 12/7 >  SIGNIFICANT EVENTS: 12/7 > admitted with sepsis of unclear etiology as well as acute hypernatremia.  LINES/TUBES: R IJ 12/7 >   DISCUSSION: 49 y.o. F who is paraplegic following intracerebral aneursymal rupture 2014 and who since has resided in SNF, admitted with shock, presumed septic shock due to UTI, hypernatremia, and AMS.  Her levophed requirement is improving as of 12/8.   ASSESSMENT / PLAN:  INFECTIOUS A:   Septic shock due to UTI  Sacral wound P:   Wound care consult Change foley Recollect urine culture Abx as above (Vanc / Aztreonam) Follow cultures as above   NEUROLOGIC A:   Acute encephalopathy - likely multifactorial in setting sepsis + hypernatremia + uremia Hx seizure disorder Hx paraplegia following ruptured intracerebral aneurysm 2014 P:   Hold sedating meds Continue outpatient vimpat, keppra Hold outpatient mirtazapine, trazodone  CARDIOVASCULAR A:  Septic shock> improving with volume resuscitation, lactic acid improved Hx HTN Cortisol OK P:  Wean Levophed as needed to maintain goal MAP > 65 Hold outpatient clonidine, lisinopril, lopressor  RENAL A:   Hypernatremia - likely due to free water loss from fever, perhaps inappropriate nutrition at her  facility Uremia AKI  P:   Change to D5W, repeat BMET Bolus saline x500cc today Monitor BMET and UOP Replace electrolytes as needed   PULMONARY A: At risk aspiration given her AMS P:   Pulmonary hygiene CXR intermittently  GASTROINTESTINAL A:   GERD Nutrition P:   Continue outpatient famotidine Advance to clears  HEMATOLOGIC A:   Hx DVT - s/p filter per report by daughter.  Not on anticoagulation given her hx hematochezia. VTE Prophylaxis. Anemia but not bleeding P:  SCD's / Heparin CBC in AM Transfuse if Hgb < 7gm/dL  ENDOCRINE A:   No acute issues P:   Monitor glucose on BMP   Family updated: Daughter and sister at bedside on 12/7, none bedside 12/8  Interdisciplinary Family Meeting v Palliative Care Meeting:  Due by: 12/13  My cc time 35 minutes   Heber Elgin, MD [place] PCCM Pager: [phone] Cell: [phone] After 3pm or if no response, call [phone]

## 2015-10-13 NOTE — Clinical Social Work Note (Signed)
Clinical Social Work Assessment  Patient Details  Name: [name] MRN: [id number] Date of Birth: Mar 14, 1966  Date of referral:  10/13/15               Reason for consult:  Facility Placement (From [facility] SNF)                Permission sought to share information with:  Facility Medical sales representative, Family Supports Permission granted to share information::  No (patient non-verbal POA is son [name])  Name::        Agency::   [facility])  Relationship::   (Son [name] is POA)  Solicitor Information:     Housing/Transportation Living arrangements for the past 2 months:  Skilled Nursing Facility ([facility] x 1 month and [facility] SNF prior) Source of Information:  Power of [place], Adult Children Patient Interpreter Needed:  None Criminal Activity/Legal Involvement Pertinent to Current Situation/Hospitalization:  No - Comment as needed Significant Relationships:  Adult Children Lives with:    Do you feel safe going back to the place where you live?  Yes Need for family participation in patient care:  Yes (Comment) (patient non-verbal)  Care giving concerns:  CSW spoke with [name], son/POA who states he is concerned about patient's wounds and re-infection   Social Worker assessment / plan:  Patient is from [facility].  Patient is non-verbal and only responds to painful stimuli- which is baseline per chart review due to a cerebral aneurism. Son states he is the POA.  Son reports patient has been in "numerous" SNFs in the past few years, but son has not found a facility able to meet patient's needs successfully - i.e. Wounds.  Patient was just transferred from [facility] to [facility] SNF x 7month ago prior to this admission.  At this time, son is requesting LTACH- [facility].  Unaware of how son received knowledge of LTACH.  RNCM is now aware of this request.  CSW unsure if patient meets criteria for LTACH.  Son is aware this is a higher level of care  and patient may/may not qualify for LTACH.  Son aware that if patient does not qualify for [facility], SNF would be the only option other than home, on the table.  Son unable to care for patient in the home setting.    Employment status:  Disabled (Comment on whether or not currently receiving Disability) Insurance information:  Medicaid In [place] PT Recommendations:  Not assessed at this time Information / Referral to community resources:   [facility] referral made for son's request for [facility])  Patient/Family's Response to care:  Son requests for patient to be considered for [facility] admission.  RNCM referral made.  Patient/Family's Understanding of and Emotional Response to Diagnosis, Current Treatment, and Prognosis:  Son is very active in patient's healthcare and appears to be realistic regarding patient's needed care at time of discharge.  Son also shows good insight into patient's prognosis.  Emotional Assessment Appearance:  Appears older than stated age Attitude/Demeanor/Rapport:   (unable to accurately assess- non-verbal) Affect (typically observed):   (unable to accurately assess- non-verbal) Orientation:   (unable to accurately assess- non-verbal responding only to painful stimuli) Alcohol / Substance use:  Not Applicable Psych involvement (Current and /or in the community):  No (Comment)  Discharge Needs  Concerns to be addressed:  Care Coordination Readmission within the last 30 days:    Current discharge risk:  Chronically ill Barriers to Discharge:  Continued Medical Work up   [employer], Meghan Gebhardt C, LCSW 10/13/2015, 12:16 PM

## 2015-10-13 NOTE — Progress Notes (Signed)
Pts sister arrived to pts room requesting additional assistance from SW. Asking for a facility that "specializes in wounds" also states "even if it is out of state that will be ok." Sister mentions 2 facilities that are [facility] centers [facility] and the [place]-- unsure of name of facility. Family appropriate and seeking a facility to help facilitate wound healing. Informed that RN will document concerns for review by SW and hospital staff.

## 2015-10-13 NOTE — Progress Notes (Signed)
Notified MD Wert about BMET results at 1738 (Na 158, Cl >130). Currently receiving Dextrose 5% Solution 50 ml/hr. Will continue to monitor and assess.

## 2015-10-13 NOTE — Progress Notes (Addendum)
CRITICAL VALUE ALERT  Critical value received: Lactic 2.1,  CL >130, 364 Serum Osmolality  Date of notification:  10/12/15  Time of notification:  2200  Critical value read back: Yes  Nurse who received alert:  Eddie NorthJennifer Amrom Ore, RN  MD notified (1st page):  ELINK  Time of first page:  2200  MD notified (2nd page):  Time of second page:  Responding MD:  Pola CornELINK  Time MD responded:  2201

## 2015-10-13 NOTE — Progress Notes (Signed)
CRITICAL VALUE ALERT  Critical value received: Gram + cocci in clusters within the Anaerobic bottle  Date of notification:  10/13/15  Time of notification:  2005  Critical value read back: Yes  Nurse who received alert: Hazel Samshristian Renuka Farfan RN  MD notified (1st page): MD Sherene SiresWert  Time of first page:  2009  MD notified (2nd page):  Time of second page:  Responding MD: Pola CornElink RN Lowella BandyNikki   Time MD responded:  2009

## 2015-10-13 NOTE — Consult Note (Signed)
WOC wound consult note Reason for Consult: Stage 4 pressure injury sacrum, reviewed chart. WOC team last saw wound in Oct. 2016.  At that time CCS involved for debridement and PT for hydrotherapy.  Bedside nurse changed the dressing this am and we discussed this wound.  No necrotic tissue present at this time. Patient is nonverbal.  Wound type: Stage 4 Pressure injury; Stage 1 pressure injury right hip, MDRPI (medical device related pressure injury sDTI (suspected deep tissue injury) bilateral anterior ankles (from the straps of her bunny booties) Pressure Ulcer POA: Yes x 4  Measurement: Sacrum: 10cm x 7cmx 3cmx with tunneling 3cm  Right ankle: 0.5cm x 8cm x 0 Left ankle: 0.5cm x 8cm x 0 Wound bed: 100% clean, pink, moist: sacrum; ankle wounds are 100% dark purple but intact;  Drainage (amount, consistency, odor) minimal per bedside nurse today at the sacral site.  None other sites. Periwound: intact  Dressing procedure/placement/frequency: Continue saline moist to moist dressings.  Monitor right hip wound.  Add Prevalon boots for offloading her heels and straps on Prevalon will not touch the foot/leg, should assist with resolution of the device related pressure ulcers.  Sport bed while in ICU for offloading his sacral wound, will add mattress replacement if transfers to floor.   Discussed POC with bedside nurse.  Re consult if needed, will not follow at this time. Thanks  Evey Mcmahan [employer] RN, CWOCN [phone])

## 2015-10-14 DIAGNOSIS — G9349 Other encephalopathy: Secondary | ICD-10-CM

## 2015-10-14 LAB — CBC WITH DIFFERENTIAL/PLATELET
BASOS PCT: 0 %
Basophils Absolute: 0 10*3/uL (ref 0.0–0.1)
EOS ABS: 0 10*3/uL (ref 0.0–0.7)
EOS PCT: 0 %
HCT: 20.9 % — ABNORMAL LOW (ref 36.0–46.0)
Hemoglobin: 6.5 g/dL — CL (ref 12.0–15.0)
LYMPHS ABS: 2.1 10*3/uL (ref 0.7–4.0)
Lymphocytes Relative: 34 %
MCH: 28.4 pg (ref 26.0–34.0)
MCHC: 31.1 g/dL (ref 30.0–36.0)
MCV: 91.3 fL (ref 78.0–100.0)
MONOS PCT: 4 %
Monocytes Absolute: 0.3 10*3/uL (ref 0.1–1.0)
Neutro Abs: 3.8 10*3/uL (ref 1.7–7.7)
Neutrophils Relative %: 62 %
PLATELETS: 236 10*3/uL (ref 150–400)
RBC: 2.29 MIL/uL — AB (ref 3.87–5.11)
RDW: 19.2 % — ABNORMAL HIGH (ref 11.5–15.5)
WBC: 6.1 10*3/uL (ref 4.0–10.5)

## 2015-10-14 LAB — BASIC METABOLIC PANEL
BUN: 68 mg/dL — AB (ref 6–20)
BUN: 64 mg/dL — AB (ref 6–20)
CALCIUM: 7.3 mg/dL — AB (ref 8.9–10.3)
CALCIUM: 7.9 mg/dL — AB (ref 8.9–10.3)
CO2: 14 mmol/L — ABNORMAL LOW (ref 22–32)
CO2: 14 mmol/L — ABNORMAL LOW (ref 22–32)
CREATININE: 1.48 mg/dL — AB (ref 0.44–1.00)
CREATININE: 1.36 mg/dL — AB (ref 0.44–1.00)
Chloride: >130 mmol/L (ref 101–111)
GFR calc Af Amer: 47 mL/min — ABNORMAL LOW (ref >60)
GFR calc Af Amer: 52 mL/min — ABNORMAL LOW (ref >60)
GFR calc non Af Amer: 41 mL/min — ABNORMAL LOW (ref >60)
GFR, EST NON AFRICAN AMERICAN: 45 mL/min — AB (ref >60)
GLUCOSE: 147 mg/dL — AB (ref 65–99)
Glucose, Bld: 108 mg/dL — ABNORMAL HIGH (ref 65–99)
Potassium: 3.7 mmol/L (ref 3.5–5.1)
Potassium: 4 mmol/L (ref 3.5–5.1)
Sodium: 153 mmol/L — ABNORMAL HIGH (ref 135–145)
Sodium: 153 mmol/L — ABNORMAL HIGH (ref 135–145)

## 2015-10-14 LAB — URINE CULTURE: Culture: 8000

## 2015-10-14 LAB — PROCALCITONIN: Procalcitonin: 0.34 ng/mL

## 2015-10-14 LAB — CULTURE, BLOOD (ROUTINE X 2)

## 2015-10-14 LAB — HEMOGLOBIN AND HEMATOCRIT, BLOOD
HCT: 26 % — ABNORMAL LOW (ref 36.0–46.0)
Hemoglobin: 8.2 g/dL — ABNORMAL LOW (ref 12.0–15.0)

## 2015-10-14 LAB — PREPARE RBC (CROSSMATCH)

## 2015-10-14 MED ORDER — DEXTROSE 5 % IV SOLN
2.0000 g | Freq: Two times a day (BID) | INTRAVENOUS | Status: DC
  Administered 2015-10-14 – 2015-10-16 (×5): 2 g via INTRAVENOUS
  Filled 2015-10-14 (×6): qty 2

## 2015-10-14 MED ORDER — SODIUM CHLORIDE 0.9 % IV SOLN
Freq: Once | INTRAVENOUS | Status: AC
Start: 2015-10-14 — End: 2015-10-14
  Administered 2015-10-14: 09:00:00 via INTRAVENOUS

## 2015-10-14 MED ORDER — DEXTROSE 5 % IV SOLN
1.0000 g | Freq: Three times a day (TID) | INTRAVENOUS | Status: DC
  Filled 2015-10-14 (×2): qty 1

## 2015-10-14 MED ORDER — SODIUM CHLORIDE 0.9 % IJ SOLN
10.0000 mL | INTRAMUSCULAR | Status: DC | PRN
  Administered 2015-10-14: 10 mL
  Administered 2015-10-16: 30 mL
  Administered 2015-10-16: 20 mL
  Administered 2015-10-18: 10 mL
  Administered 2015-10-18: 20 mL
  Administered 2015-10-19 – 2015-10-20 (×6): 10 mL
  Administered 2015-10-21: 3 mL
  Administered 2015-10-21 – 2015-10-25 (×8): 10 mL
  Administered 2015-10-26: 20 mL
  Filled 2015-10-14 (×20): qty 40

## 2015-10-14 MED ORDER — METOPROLOL TARTRATE 25 MG PO TABS
25.0000 mg | ORAL_TABLET | Freq: Two times a day (BID) | ORAL | Status: DC
  Administered 2015-10-14 – 2015-10-16 (×5): 25 mg via ORAL
  Filled 2015-10-14 (×6): qty 1

## 2015-10-14 NOTE — Progress Notes (Signed)
Called Drue Secondorre [facility](POA) to try to obtain consent for blood administration. Pt's BP is stable and making urine at this time. Will try to call again.

## 2015-10-14 NOTE — Progress Notes (Signed)
eLink Physician-Brief Progress Note Patient Name: Meghan PoreLaura Marie Mangan DOB: 11/09/1965 MRN: [id number]   Date of Service  10/14/2015  HPI/Events of Note  Hb 6.5.   eICU Interventions  Will transfuse 1 unit PRBC.     Intervention Category Major Interventions: Other:  Shamaine Mulkern 10/14/2015, 4:49 AM

## 2015-10-14 NOTE — Progress Notes (Signed)
CRITICAL VALUE ALERT  Critical value received:  Hgb 6.5  Date of notification: 10/14/15  Time of notification:  0440  Critical value read back: Yes  Nurse who received alert:  Annabell Sabalourtney Wofford RN   MD notified (1st page):  MD Craige CottaSood   Time of first page:  [phone]  MD notified (2nd page):  Time of second page:  Responding MD: MD Craige CottaSood   Time MD responded:  [phone]

## 2015-10-14 NOTE — Progress Notes (Signed)
CRITICAL VALUE ALERT  Critical value received:  Chloride >130  Date of notification:  10/14/2015   Time of notification:  1904, taken by nurse Chip BoerVicki  Critical value read back:Yes.    Nurse who received alert:  Chip BoerVicki  MD notified (1st page):  D. Tyson AliasFeinstein  Time of first page:  2031  MD notified (2nd page):  Time of second page:  Responding MD:  D. Tyson AliasFeinstein  Time MD responded:

## 2015-10-14 NOTE — Progress Notes (Signed)
Called [name] Sida [facility](POA) to try to obtain consent for blood administration again, no response. Called [name] (sister) in regards to this. She said "will call him to get in touch with him". Will continue to monitor and assess. Pt is stable.

## 2015-10-14 NOTE — Progress Notes (Addendum)
Pharmacy Antibiotic Time-Out Note  [name] is a 49 y.o. year-old female admitted on 10/12/2015.  The patient is currently on Vancomycin and Ceftazidime for sepsis.  Assessment/Plan: Day #2 of empiric abx for sepsis due to probable UTI. Was on vancomycin, levaquin, and fluconazole switching to vancomycin and ceftazidime. Blood cx's are 1/2 with GPC so far and urine recollect is ngtd. PCN allergy but has tolerated cephalosporins before. Afebrile, WBC wnl. Scr elevated but down to 1.48 (peaked at 2.51) (baseline ~0.5) CrCl ~8345ml/min. UOP 820 ml yesterday.  Plan: Continue vancomycin 1g IV Q24 tonight at 1800 Start ceftazidime 2g IV Q12 Monitor clinical picture, renal function, VT prn F/U C&S, abx deescalation / LOT  Temp (24hrs), Avg:97.5 F (36.4 C), Min:96.3 F (35.7 C), Max:98.3 F (36.8 C)   Recent Labs Lab 10/12/15 1600 10/13/15 0450 10/14/15 0415  WBC 9.4 8.4 6.1    Recent Labs Lab 10/13/15 0215 10/13/15 0425 10/13/15 0530 10/13/15 1738 10/14/15 0415  CREATININE 1.95* 1.89* 1.81* 1.52* 1.48*   Estimated Creatinine Clearance: 47.7 mL/min (by C-G formula based on Cr of 1.48).   Allergies  Allergen Reactions  . Amlodipine Swelling  . Penicillins Swelling    Has patient had a PCN reaction causing immediate rash, facial/tongue/throat swelling, SOB or lightheadedness with hypotension: Yes Has tolerated cephalosporins in the past Has patient had a PCN reaction causing severe rash involving mucus membranes or skin necrosis: No Has patient had a PCN reaction that required hospitalization No Has patient had a PCN reaction occurring within the last 10 years: No If all of the above answers are "NO", then may proceed with Cephalosporin use.  . Latex Rash    Unknown   . Other Other (See Comments)    Natural Rubber- Unknown     Antimicrobials this admission:  Vanc 12/7 >> Aztreonam 12/7 x 1 Fluc 12/7 >> 12/9 Levaquin 12/7 >> 12/9 Ceftazidime 12/9 >>    Microbiology Results: Wound cx 12/8 > sent Blood cx 12/7 > 1/2 GPC in clusters Urine cx 12/7 > multiple species; suggest recollect Urine cx 12/8 > no growth pending (already on abx)  Thank you for allowing pharmacy to be a part of this patient's care.  Enzo BiNathan Ramond Darnell, PharmD, BCPS Clinical Pharmacist Pager [phone] 10/14/2015 12:31 PM

## 2015-10-14 NOTE — Progress Notes (Signed)
PULMONARY / CRITICAL CARE MEDICINE   Name: [name] MRN: [id number] DOB: [birth year]    ADMISSION DATE:  10/12/2015 CONSULTATION DATE:  10/12/15  REFERRING MD:  EDP  CHIEF COMPLAINT:  AMS, hypotension, metabolic derangements  Brief history :  49 y/o paraplegic who lives in a SNF was admitted on 12/7 with sepsis due to a UTI.  Non-verbal at baseline, presented to our ER with stage IV sacral decubitus ulcer.    SUBJECTIVE:  Levaquin> change to aztreonam  vanc continue for gpc in blood PRBC this morning for anemia Continue D5 at current rate    VITAL SIGNS: BP 153/85 mmHg  Pulse 107  Temp(Src) 97.5 F (36.4 C) (Axillary)  Resp 20  Ht 5' 3"  (1.6 m)  Wt 83.8 kg (184 lb 11.9 oz)  BMI 32.73 kg/m2  SpO2 100%  HEMODYNAMICS: CVP:  [5 mmHg-8 mmHg] 7 mmHg  VENTILATOR SETTINGS:    INTAKE / OUTPUT: I/O last 3 completed shifts: In: 6347.2 [P.O.:100; I.V.:2747.2; IV Piggyback:3500] Out: 8242 [Urine:1570]   PHYSICAL EXAMINATION:  Gen: chronically ill appearing HENT: OP clear, neck supple PULM: CTA B, normal effort CV: RRR, no mgr, trace edema GI: BS+, soft, nontender Derm: lesions on feet unchanged Neuro: more alert today, but still with discordant guaze, tracks, doesn't follow commands, winces to pain   LABS:  BMET  Recent Labs Lab 10/13/15 0530 10/13/15 1738 10/14/15 0415  NA 156* 158* 153*  K 4.3 3.8 3.7  CL >130* >130* >130*  CO2 15* 14* 14*  BUN 77* 69* 68*  CREATININE 1.81* 1.52* 1.48*  GLUCOSE 102* 107* 108*    Electrolytes  Recent Labs Lab 10/13/15 0450 10/13/15 0530 10/13/15 1738 10/14/15 0415  CALCIUM  --  7.0* 7.4* 7.3*  MG 1.7  --   --   --   PHOS 1.9*  --   --   --     CBC  Recent Labs Lab 10/12/15 1600 10/13/15 0450 10/14/15 0415  WBC 9.4 8.4 6.1  HGB 7.3* 7.0* 6.5*  HCT 24.2* 22.4* 20.9*  PLT 279 254 236    Coag's No results for input(s): APTT, INR in the last 168 hours.  Sepsis Markers  Recent Labs Lab  10/12/15 1613 10/12/15 2030 10/12/15 2330 10/13/15 0450 10/14/15 0415  LATICACIDVEN 2.37* 2.1* 1.1  --   --   PROCALCITON  --  0.25  --  0.28 0.34    ABG  Recent Labs Lab 10/12/15 2216  PHART 7.333*  PCO2ART 24.5*  PO2ART 99.0    Liver Enzymes  Recent Labs Lab 10/12/15 1600 10/12/15 2145  AST 25 16  ALT 17 19  ALKPHOS 72 69  BILITOT 0.4 0.3  ALBUMIN 2.3* 2.0*    Cardiac Enzymes No results for input(s): TROPONINI, PROBNP in the last 168 hours.  Glucose  Recent Labs Lab 10/12/15 2058  GLUCAP 140*    Imaging 12/8 CXR port > images personally reviewed> clear lungs, low volumes, IJ CVL in place   STUDIES:  CXR 12/7 > no acute process  CULTURES: Blood 12/7 >GPC 1/2 Urine 12/7 > multiple species Sputum 12/7 > Sacral wound 12/7 > 12/8 urine >  ANTIBIOTICS: Vanc 12/7 > Aztreonam 12/7 >12/8 Levaquin 12/8 x1 dose ? Ceftaz 12/9 >   SIGNIFICANT EVENTS: 12/7 > admitted with sepsis of unclear etiology as well as acute hypernatremia.  LINES/TUBES: R IJ 12/7 >   DISCUSSION: 49 y.o. F who is paraplegic following intracerebral aneursymal rupture 2014 and who since has resided  in SNF, admitted with shock, presumed septic shock due to UTI, hypernatremia, and AMS.  No longer in shock as of 12/9.   ASSESSMENT / PLAN:  INFECTIOUS A:   Septic shock due to UTI GPC in blood, hopefuly contaminant Sacral wound P:   Wound care consult F/u repeat urine culture Continue Vanc Change to ceftaz (has tolerated in the past)   NEUROLOGIC A:   Acute encephalopathy > 12/9 appears to be at baseline Hx seizure disorder Hx paraplegia following ruptured intracerebral aneurysm 2014 P:   Hold sedating meds Continue outpatient vimpat, keppra Continue to hold outpatient mirtazapine, trazodone as no clear indication right now  CARDIOVASCULAR A:  Septic shock> improving with volume resuscitation, lactic acid improved Hx HTN Cortisol OK P:  Hold outpatient  clonidine, lisinopril,  Restart lopressor 12/9  RENAL A:   Hypernatremia - improving with D5 Non-gap met acidosis, likely due to saline resuscitation AKI> improving P:   Continue D5W, repeat BMET today and in AM to ensure Na not dropping too quickly Encourage po water intake Monitor BMET and UOP Replace electrolytes as needed   PULMONARY A: At risk aspiration given her AMS P:   Pulmonary hygiene CXR intermittently  GASTROINTESTINAL A:   GERD Nutrition P:   Continue outpatient famotidine Advance to clears  HEMATOLOGIC A:   Hx DVT - s/p filter per report by daughter.  Not on anticoagulation given her hx hematochezia. VTE Prophylaxis. Anemia but not bleeding P:  SCD's / Heparin CBC in AM Transfuse if Hgb < 7gm/dL (1 U PRBC today)  ENDOCRINE A:   No acute issues P:   Monitor glucose on BMP   Family updated: Daughter and sister at bedside on 12/7, none bedside 12/8  Interdisciplinary Family Meeting v Palliative Care Meeting:  Due by: 12/13  Move to [place], [facility] service   Roselie Awkward, MD [place] PCCM Pager: [phone] Cell: [phone] After 3pm or if no response, call [phone]

## 2015-10-14 NOTE — Progress Notes (Signed)
Pt transferred from 25M to room 34. Settled in room. Vitals taken and patient assessed. No skin issues except stage 4 on sacrum reported from nurse on 25M. Dressing change qd and has been done today. Pt is paralyzed and can not speak. Turned TV on. Will continue to follow as needed.

## 2015-10-14 NOTE — Progress Notes (Signed)
CRITICAL VALUE ALERT  Critical value received: Chloride > 130  Date of notification: 10/14/15  Time of notification:  0452  Critical value read back: Yes  Nurse who received alert: Hazel Samshristian Kaiyu Mirabal RN  MD notified (1st page): MD Craige CottaSood  Time of first page:  0504  MD notified (2nd page):  Time of second page:  Responding MD:  MD Craige CottaSood  Time MD responded:  [phone]

## 2015-10-15 DIAGNOSIS — N178 Other acute kidney failure: Secondary | ICD-10-CM

## 2015-10-15 LAB — BASIC METABOLIC PANEL
BUN: 58 mg/dL — ABNORMAL HIGH (ref 6–20)
CALCIUM: 8.1 mg/dL — AB (ref 8.9–10.3)
CO2: 16 mmol/L — AB (ref 22–32)
CREATININE: 1.22 mg/dL — AB (ref 0.44–1.00)
Chloride: >130 mmol/L (ref 101–111)
GFR calc Af Amer: 60 mL/min — ABNORMAL LOW (ref >60)
GFR calc non Af Amer: 52 mL/min — ABNORMAL LOW (ref >60)
Glucose, Bld: 82 mg/dL (ref 65–99)
Potassium: 3.3 mmol/L — ABNORMAL LOW (ref 3.5–5.1)
SODIUM: 154 mmol/L — AB (ref 135–145)

## 2015-10-15 LAB — CBC WITH DIFFERENTIAL/PLATELET
BASOS PCT: 0 %
Basophils Absolute: 0 10*3/uL (ref 0.0–0.1)
EOS ABS: 0 10*3/uL (ref 0.0–0.7)
Eosinophils Relative: 0 %
HCT: 26.3 % — ABNORMAL LOW (ref 36.0–46.0)
HEMOGLOBIN: 8.5 g/dL — AB (ref 12.0–15.0)
Lymphocytes Relative: 47 %
Lymphs Abs: 4.5 10*3/uL — ABNORMAL HIGH (ref 0.7–4.0)
MCH: 28.3 pg (ref 26.0–34.0)
MCHC: 32.3 g/dL (ref 30.0–36.0)
MCV: 87.7 fL (ref 78.0–100.0)
MONOS PCT: 5 %
Monocytes Absolute: 0.5 10*3/uL (ref 0.1–1.0)
NEUTROS PCT: 48 %
Neutro Abs: 4.7 10*3/uL (ref 1.7–7.7)
Platelets: 251 10*3/uL (ref 150–400)
RBC: 3 MIL/uL — ABNORMAL LOW (ref 3.87–5.11)
RDW: 18.9 % — AB (ref 11.5–15.5)
WBC: 9.7 10*3/uL (ref 4.0–10.5)

## 2015-10-15 LAB — TYPE AND SCREEN
ABO/RH(D): A POS
Antibody Screen: NEGATIVE
Unit division: 0

## 2015-10-15 NOTE — Progress Notes (Signed)
[facility] PROGRESS NOTE  [name] ZOX:[id number] DOB: 12-21-1965 DOA: 10/12/2015 PCP: Ruthe Mannan, MD  Assessment/Plan: 1. Septic shock -Evidenced by blood pressure of 76/15, temperature of 94.2, respiratory of 31, requiring IV pressor support. She was initially admitted to the intensive care unit under the care of pulmonary critical care medicine. -Suspect source of infection to be urinary tract infection -Blood cultures drawn on 10/12/2015 growing coag was negative staph from 1/2 bottles -Urine cultures unremarkable. -She is currently on vancomycin and Fortaz IV.  2.  Acute kidney injury. -She initially presented with a BUN of 113 and creatinine of 3.15, likely secondary to septic shock. -Creatinine has gradually trended down with aggressive IV fluid resuscitation. A.m. lab work showing creatinine of 1.22 with BUN of 58.  3.  Acute encephalopathy -Patient found by nursing staff to be minimally responsive -Resolving. At baseline she is nonverbal. However is awake and seems more responsive.. -Likely secondary to septic shock  4.  Hypernatremia. -Likely secondary to falling depletion. On presentation had a sodium of 163. -Her sodium levels have been coming down gradually with D5W  5.  History of DVT. -Currently not on anticoagulation due to history of GI bleed. -Status post IVC filter placement  6.  Stage IV decubitus ulcer -Patient was evaluated by wound care during this hospitalization, recommended continuing saline moist to moist dressings, adding prevalon boots. -Ordered air mattress today.   7.  Nutrition -Patient on dysphagia 1 diet  Code Status: Full code Family Communication: Family not present in room Disposition Plan: Anticipate discharge to skilled nurse facility the next 24-48 hours   Consultants:  Pulmonary critical care medicine  Antibiotics:  IV vancomycin  IV ceftazidime  HPI/Subjective: [name] is a 49 year old female with a  past medical history ruptured cerebral aneurysm in 2014, with resultant paraplegia, nonverbal at baseline, current resident at skilled nursing facility, transferred to the emergency department at [facility] when she was found by nursing staff to be lethargic on admission she was septic evidence by a pressure of 76/15, temperature of 94.2, respiratory rate of 31. She was admitted to the pulmonary critical care service. She required IV pressors support. She showed clinical improvement and was transferred to the medicine service on 10/15/2015.  Objective: Filed Vitals:   10/15/15 0529 10/15/15 1325  BP: 141/93 145/90  Pulse: 79 64  Temp: 97.9 F (36.6 C) 98.5 F (36.9 C)  Resp: 18 20    Intake/Output Summary (Last 24 hours) at 10/15/15 1610 Last data filed at 10/15/15 1325  Gross per 24 hour  Intake    118 ml  Output    900 ml  Net   -782 ml   Filed Weights   10/13/15 0500 10/14/15 0500 10/15/15 0500  Weight: 80.7 kg (177 lb 14.6 oz) 83.8 kg (184 lb 11.9 oz) 80.8 kg (178 lb 2.1 oz)    Exam:   General:  Patient is awake and alert, nonverbal, not following commands  Cardiovascular: Regular rate and rhythm, normal S1S2  Respiratory: Having a normal inspiratory effort, no crackles or rhonchi  Abdomen: Soft, nontender nondistended  Musculoskeletal: Stage IV sacral decub, boots in place to lower extremities b/l  Data Reviewed: Basic Metabolic Panel:  Recent Labs Lab 10/13/15 0450 10/13/15 0530 10/13/15 1738 10/14/15 0415 10/14/15 1809 10/15/15 0347  NA  --  156* 158* 153* 153* 154*  K  --  4.3 3.8 3.7 4.0 3.3*  CL  --  >130* >130* >130* >130* >130*  CO2  --  15* 14* 14* 14* 16*  GLUCOSE  --  102* 107* 108* 147* 82  BUN  --  77* 69* 68* 64* 58*  CREATININE  --  1.81* 1.52* 1.48* 1.36* 1.22*  CALCIUM  --  7.0* 7.4* 7.3* 7.9* 8.1*  MG 1.7  --   --   --   --   --   PHOS 1.9*  --   --   --   --   --    Liver Function Tests:  Recent Labs Lab 10/12/15 1600  10/12/15 2145  AST 25 16  ALT 17 19  ALKPHOS 72 69  BILITOT 0.4 0.3  PROT 6.3* 5.7*  ALBUMIN 2.3* 2.0*    Recent Labs Lab 10/12/15 1600  LIPASE 23    Recent Labs Lab 10/12/15 2142  AMMONIA 23   CBC:  Recent Labs Lab 10/12/15 1600 10/13/15 0450 10/14/15 0415 10/14/15 2115 10/15/15 0347  WBC 9.4 8.4 6.1  --  9.7  NEUTROABS 5.9  --  3.8  --  4.7  HGB 7.3* 7.0* 6.5* 8.2* 8.5*  HCT 24.2* 22.4* 20.9* 26.0* 26.3*  MCV 92.7 91.8 91.3  --  87.7  PLT 279 254 236  --  251   Cardiac Enzymes: No results for input(s): CKTOTAL, CKMB, CKMBINDEX, TROPONINI in the last 168 hours. BNP (last 3 results)  Recent Labs  03/18/15 1240 08/29/15 1305  BNP 69.7 47.2    ProBNP (last 3 results) No results for input(s): PROBNP in the last 8760 hours.  CBG:  Recent Labs Lab 10/12/15 2058  GLUCAP 140*    Recent Results (from the past 240 hour(s))  Blood culture (routine x 2)     Status: None   Collection Time: 10/12/15  4:00 PM  Result Value Ref Range Status   Specimen Description BLOOD RIGHT ARM  Final   Special Requests BOTTLES DRAWN AEROBIC AND ANAEROBIC 5CC  Final   Culture  Setup Time   Final    GRAM POSITIVE COCCI IN CLUSTERS ANAEROBIC BOTTLE ONLY CRITICAL RESULT CALLED TO, READ BACK BY AND VERIFIED WITH: Erby PianC SMITH RN 2006 10/13/15 A BROWNING    Culture   Final    STAPHYLOCOCCUS SPECIES (COAGULASE NEGATIVE) THE SIGNIFICANCE OF ISOLATING THIS ORGANISM FROM A SINGLE SET OF BLOOD CULTURES WHEN MULTIPLE SETS ARE DRAWN IS UNCERTAIN. PLEASE NOTIFY THE MICROBIOLOGY DEPARTMENT WITHIN ONE WEEK IF SPECIATION AND SENSITIVITIES ARE REQUIRED.    Report Status 10/14/2015 FINAL  Final  Urine culture     Status: None   Collection Time: 10/12/15  4:55 PM  Result Value Ref Range Status   Specimen Description URINE, RANDOM  Final   Special Requests NONE  Final   Culture MULTIPLE SPECIES PRESENT, SUGGEST RECOLLECTION  Final   Report Status 10/13/2015 FINAL  Final  MRSA PCR Screening      Status: None   Collection Time: 10/12/15  9:49 PM  Result Value Ref Range Status   MRSA by PCR NEGATIVE NEGATIVE Final    Comment:        The GeneXpert MRSA Assay (FDA approved for NASAL specimens only), is one component of a comprehensive MRSA colonization surveillance program. It is not intended to diagnose MRSA infection nor to guide or monitor treatment for MRSA infections.   Blood culture (routine x 2)     Status: None (Preliminary result)   Collection Time: 10/12/15 10:12 PM  Result Value Ref Range Status   Specimen Description BLOOD CENTRAL LINE  Final  Special Requests BOTTLES DRAWN AEROBIC AND ANAEROBIC 10CC  Final   Culture NO GROWTH 3 DAYS  Final   Report Status PENDING  Incomplete  Wound culture     Status: None (Preliminary result)   Collection Time: 10/13/15  5:55 AM  Result Value Ref Range Status   Specimen Description WOUND  Final   Special Requests WOUND SACRUM  Final   Gram Stain   Final    FEW WBC PRESENT,BOTH PMN AND MONONUCLEAR NO SQUAMOUS EPITHELIAL CELLS SEEN NO ORGANISMS SEEN Performed at [facility]    Culture   Final    Culture reincubated for better growth Performed at [facility]    Report Status PENDING  Incomplete  Urine culture     Status: None   Collection Time: 10/13/15  6:01 PM  Result Value Ref Range Status   Specimen Description URINE, CATHETERIZED  Final   Special Requests NONE  Final   Culture 8,000 COLONIES/mL INSIGNIFICANT GROWTH  Final   Report Status 10/14/2015 FINAL  Final     Studies: No results found.  Scheduled Meds: . cefTAZidime (FORTAZ)  IV  2 g Intravenous Q12H  . famotidine (PEPCID) IV  20 mg Intravenous Q12H  . feeding supplement (ENSURE ENLIVE)  237 mL Oral TID BM  . heparin  5,000 Units Subcutaneous 3 times per day  . lacosamide (VIMPAT) IV  100 mg Intravenous Q12H  . levETIRAcetam  1,500 mg Intravenous Q12H  . metoprolol tartrate  25 mg Oral BID  . multivitamin with minerals  1 tablet  Oral Daily  . vancomycin  1,000 mg Intravenous Q24H   Continuous Infusions: . norepinephrine (LEVOPHED) Adult infusion Stopped (10/13/15 1015)    Active Problems:   Pressure ulcer   Sepsis (HCC)   UTI (lower urinary tract infection)   Acute renal failure (ARF) (HCC)   Septic shock (HCC)    Time spent: 30 min    Jeralyn Bennett  [facility] Pager [phone]. If 7PM-7AM, please contact night-coverage at [email or web address], password [facility] 10/15/2015, 4:10 PM  LOS: 3 days

## 2015-10-15 NOTE — Progress Notes (Signed)
CRITICAL VALUE ALERT  Critical value received:  Cl>130  Date of notification:  10/15/15   Time of notification:  0456  Critical value read back: yes  Nurse who received alert:  Meade Hogeland, Joan MayansKristina Nicole  MD notified (1st page):  Dr. Craige CottaSood  Time of first page:  04:57  MD notified (2nd page):  Time of second page:  Responding MD:  Dr. Craige CottaSood  Time MD responded:  04:57  Will continue to monitor patient.

## 2015-10-16 LAB — CBC
HEMATOCRIT: 27.3 % — AB (ref 36.0–46.0)
Hemoglobin: 8.9 g/dL — ABNORMAL LOW (ref 12.0–15.0)
MCH: 28.5 pg (ref 26.0–34.0)
MCHC: 32.6 g/dL (ref 30.0–36.0)
MCV: 87.5 fL (ref 78.0–100.0)
PLATELETS: 264 10*3/uL (ref 150–400)
RBC: 3.12 MIL/uL — ABNORMAL LOW (ref 3.87–5.11)
RDW: 19.5 % — AB (ref 11.5–15.5)
WBC: 7.6 10*3/uL (ref 4.0–10.5)

## 2015-10-16 LAB — BASIC METABOLIC PANEL
BUN: 42 mg/dL — AB (ref 6–20)
CO2: 16 mmol/L — ABNORMAL LOW (ref 22–32)
CREATININE: 0.89 mg/dL (ref 0.44–1.00)
Calcium: 8.7 mg/dL — ABNORMAL LOW (ref 8.9–10.3)
GFR calc Af Amer: >60 mL/min (ref >60)
GLUCOSE: 81 mg/dL (ref 65–99)
POTASSIUM: 3.2 mmol/L — AB (ref 3.5–5.1)
Sodium: 154 mmol/L — ABNORMAL HIGH (ref 135–145)

## 2015-10-16 MED ORDER — POTASSIUM CHLORIDE 10 MEQ/100ML IV SOLN
10.0000 meq | INTRAVENOUS | Status: AC
  Administered 2015-10-16 (×4): 10 meq via INTRAVENOUS
  Filled 2015-10-16 (×4): qty 100

## 2015-10-16 MED ORDER — VANCOMYCIN HCL IN DEXTROSE 1-5 GM/200ML-% IV SOLN
1000.0000 mg | Freq: Two times a day (BID) | INTRAVENOUS | Status: DC
  Filled 2015-10-16 (×2): qty 200

## 2015-10-16 MED ORDER — CEFUROXIME AXETIL 250 MG/5ML PO SUSR
250.0000 mg | Freq: Two times a day (BID) | ORAL | Status: DC
  Administered 2015-10-16 – 2015-10-20 (×9): 250 mg via ORAL
  Filled 2015-10-16 (×13): qty 5

## 2015-10-16 MED ORDER — POTASSIUM CHLORIDE CRYS ER 20 MEQ PO TBCR
40.0000 meq | EXTENDED_RELEASE_TABLET | Freq: Four times a day (QID) | ORAL | Status: DC
  Administered 2015-10-16: 40 meq via ORAL
  Filled 2015-10-16: qty 2

## 2015-10-16 MED ORDER — METOPROLOL TARTRATE 50 MG PO TABS
50.0000 mg | ORAL_TABLET | Freq: Two times a day (BID) | ORAL | Status: DC
  Administered 2015-10-16 – 2015-10-26 (×21): 50 mg via ORAL
  Filled 2015-10-16 (×21): qty 1

## 2015-10-16 MED ORDER — LISINOPRIL 20 MG PO TABS
20.0000 mg | ORAL_TABLET | Freq: Every day | ORAL | Status: DC
  Administered 2015-10-16 – 2015-10-26 (×11): 20 mg via ORAL
  Filled 2015-10-16 (×11): qty 1

## 2015-10-16 NOTE — Progress Notes (Addendum)
[facility] PROGRESS NOTE  Donald PoreLaura Marie Audia ZOX:[id number] DOB: 01/15/1966 DOA: 10/12/2015 PCP: [name], MD  Assessment/Plan: 1. Septic shock -Evidenced by blood pressure of 76/15, temperature of 94.2, respiratory of 31, requiring IV pressor support. She was initially admitted to the intensive care unit under the care of pulmonary critical care medicine. -Suspect source of infection to be urinary tract infection -Blood cultures drawn on 10/12/2015 growing coag negative staph from 1/2 bottles. Suspect that this reflects contaminant.  -Urine cultures unremarkable. -Will transition to Ceftin 250 mg PO BID  2.  Acute kidney injury. -She initially presented with a BUN of 113 and creatinine of 3.15, likely secondary to septic shock. -Creatinine has gradually trended down with aggressive IV fluid resuscitation. A.m. lab work showing creatinine of 1.22 with BUN of 58.  3.  Acute encephalopathy -Patient found by nursing staff to be minimally responsive -Resolving. At baseline she is nonverbal. However is awake and seems more responsive.. -Likely secondary to septic shock  4.  Hypernatremia. -Likely secondary to falling depletion. On presentation had a sodium of 163. -Her sodium levels have been coming down gradually with D5W  5.  History of DVT. -Currently not on anticoagulation due to history of GI bleed. -Status post IVC filter placement  6.  Stage IV decubitus ulcer -Patient was evaluated by wound care during this hospitalization, recommended continuing saline moist to moist dressings, adding prevalon boots. -Ordered air mattress today.   7.  Nutrition -Patient on dysphagia 1 diet  8.  Hypokalemia -AM labs showing K of 3.2  -Will give KCl 40 meq   9. History of HTN -Blood pressures are now elevated with SBP's in the 150's/100's  -Will increase metoprolol to 50 mg PO BID and add lisinopril 20 mg PO q daily  Code Status: Full code Family Communication: Attempted to call  family left voicemail Disposition Plan: Anticipate discharge to skilled nurse facility the next 24 hours   Consultants:  Pulmonary critical care medicine  Antibiotics:  IV vancomycin stopped on 10/16/2015  IV ceftazidime stopped on 10/16/2015  Ceftin started on 10/16/2015  HPI/Subjective: [name] is a 49 year old female with a past medical history ruptured cerebral aneurysm in 2014, with resultant paraplegia, nonverbal at baseline, current resident at skilled nursing facility, [facility] when she was found by nursing staff to be lethargic on admission she was septic evidence by a pressure of 76/15, temperature of 94.2, respiratory rate of 31. She was admitted to the pulmonary critical care service. She required IV pressors support. She showed clinical improvement and was transferred to the medicine service on 10/15/2015.  Objective: Filed Vitals:   10/16/15 0426 10/16/15 1348  BP: 150/109 156/94  Pulse: 97 81  Temp: 98.5 F (36.9 C) 97.6 F (36.4 C)  Resp: 24 20    Intake/Output Summary (Last 24 hours) at 10/16/15 1433 Last data filed at 10/16/15 1348  Gross per 24 hour  Intake    785 ml  Output    800 ml  Net    -15 ml   Filed Weights   10/13/15 0500 10/14/15 0500 10/15/15 0500  Weight: 80.7 kg (177 lb 14.6 oz) 83.8 kg (184 lb 11.9 oz) 80.8 kg (178 lb 2.1 oz)    Exam:   General:  Patient is awake and alert, nonverbal, not following commands  Cardiovascular: Regular rate and rhythm, normal S1S2  Respiratory: Having a normal inspiratory effort, no crackles or rhonchi  Abdomen: Soft, nontender  nondistended  Musculoskeletal: I evaluated  8 cm x 6 cm stage IV decub ulcer, packing removed, bone exposed. No evidence of active infection.   Data Reviewed: Basic Metabolic Panel:  Recent Labs Lab 10/13/15 0450  10/13/15 1738 10/14/15 0415 10/14/15 1809 10/15/15 0347 10/16/15 0445  NA  --   < > 158* 153*  153* 154* 154*  K  --   < > 3.8 3.7 4.0 3.3* 3.2*  CL  --   < > >130* >130* >130* >130* >130*  CO2  --   < > 14* 14* 14* 16* 16*  GLUCOSE  --   < > 107* 108* 147* 82 81  BUN  --   < > 69* 68* 64* 58* 42*  CREATININE  --   < > 1.52* 1.48* 1.36* 1.22* 0.89  CALCIUM  --   < > 7.4* 7.3* 7.9* 8.1* 8.7*  MG 1.7  --   --   --   --   --   --   PHOS 1.9*  --   --   --   --   --   --   < > = values in this interval not displayed. Liver Function Tests:  Recent Labs Lab 10/12/15 1600 10/12/15 2145  AST 25 16  ALT 17 19  ALKPHOS 72 69  BILITOT 0.4 0.3  PROT 6.3* 5.7*  ALBUMIN 2.3* 2.0*    Recent Labs Lab 10/12/15 1600  LIPASE 23    Recent Labs Lab 10/12/15 2142  AMMONIA 23   CBC:  Recent Labs Lab 10/12/15 1600 10/13/15 0450 10/14/15 0415 10/14/15 2115 10/15/15 0347 10/16/15 0445  WBC 9.4 8.4 6.1  --  9.7 7.6  NEUTROABS 5.9  --  3.8  --  4.7  --   HGB 7.3* 7.0* 6.5* 8.2* 8.5* 8.9*  HCT 24.2* 22.4* 20.9* 26.0* 26.3* 27.3*  MCV 92.7 91.8 91.3  --  87.7 87.5  PLT 279 254 236  --  251 264   Cardiac Enzymes: No results for input(s): CKTOTAL, CKMB, CKMBINDEX, TROPONINI in the last 168 hours. BNP (last 3 results)  Recent Labs  03/18/15 1240 08/29/15 1305  BNP 69.7 47.2    ProBNP (last 3 results) No results for input(s): PROBNP in the last 8760 hours.  CBG:  Recent Labs Lab 10/12/15 2058  GLUCAP 140*    Recent Results (from the past 240 hour(s))  Blood culture (routine x 2)     Status: None   Collection Time: 10/12/15  4:00 PM  Result Value Ref Range Status   Specimen Description BLOOD RIGHT ARM  Final   Special Requests BOTTLES DRAWN AEROBIC AND ANAEROBIC 5CC  Final   Culture  Setup Time   Final    GRAM POSITIVE COCCI IN CLUSTERS ANAEROBIC BOTTLE ONLY CRITICAL RESULT CALLED TO, READ BACK BY AND VERIFIED WITH: Erby Pian RN 2006 10/13/15 A BROWNING    Culture   Final    STAPHYLOCOCCUS SPECIES (COAGULASE NEGATIVE) THE SIGNIFICANCE OF ISOLATING THIS  ORGANISM FROM A SINGLE SET OF BLOOD CULTURES WHEN MULTIPLE SETS ARE DRAWN IS UNCERTAIN. PLEASE NOTIFY THE MICROBIOLOGY DEPARTMENT WITHIN ONE WEEK IF SPECIATION AND SENSITIVITIES ARE REQUIRED.    Report Status 10/14/2015 FINAL  Final  Urine culture     Status: None   Collection Time: 10/12/15  4:55 PM  Result Value Ref Range Status   Specimen Description URINE, RANDOM  Final   Special Requests NONE  Final   Culture MULTIPLE SPECIES PRESENT, SUGGEST RECOLLECTION  Final   Report Status 10/13/2015 FINAL  Final  MRSA PCR Screening     Status: None   Collection Time: 10/12/15  9:49 PM  Result Value Ref Range Status   MRSA by PCR NEGATIVE NEGATIVE Final    Comment:        The GeneXpert MRSA Assay (FDA approved for NASAL specimens only), is one component of a comprehensive MRSA colonization surveillance program. It is not intended to diagnose MRSA infection nor to guide or monitor treatment for MRSA infections.   Blood culture (routine x 2)     Status: None (Preliminary result)   Collection Time: 10/12/15 10:12 PM  Result Value Ref Range Status   Specimen Description BLOOD CENTRAL LINE  Final   Special Requests BOTTLES DRAWN AEROBIC AND ANAEROBIC 10CC  Final   Culture NO GROWTH 4 DAYS  Final   Report Status PENDING  Incomplete  Wound culture     Status: None (Preliminary result)   Collection Time: 10/13/15  5:55 AM  Result Value Ref Range Status   Specimen Description WOUND  Final   Special Requests WOUND SACRUM  Final   Gram Stain   Final    FEW WBC PRESENT,BOTH PMN AND MONONUCLEAR NO SQUAMOUS EPITHELIAL CELLS SEEN NO ORGANISMS SEEN Performed at [facility]    Culture   Final    MODERATE STAPHYLOCOCCUS AUREUS Note: RIFAMPIN AND GENTAMICIN SHOULD NOT BE USED AS SINGLE DRUGS FOR TREATMENT OF STAPH INFECTIONS. Performed at [facility]    Report Status PENDING  Incomplete  Urine culture     Status: None   Collection Time: 10/13/15  6:01 PM  Result Value Ref  Range Status   Specimen Description URINE, CATHETERIZED  Final   Special Requests NONE  Final   Culture 8,000 COLONIES/mL INSIGNIFICANT GROWTH  Final   Report Status 10/14/2015 FINAL  Final     Studies: No results found.  Scheduled Meds: . cefTAZidime (FORTAZ)  IV  2 g Intravenous Q12H  . famotidine (PEPCID) IV  20 mg Intravenous Q12H  . feeding supplement (ENSURE ENLIVE)  237 mL Oral TID BM  . heparin  5,000 Units Subcutaneous 3 times per day  . lacosamide (VIMPAT) IV  100 mg Intravenous Q12H  . levETIRAcetam  1,500 mg Intravenous Q12H  . metoprolol tartrate  25 mg Oral BID  . multivitamin with minerals  1 tablet Oral Daily  . vancomycin  1,000 mg Intravenous Q12H   Continuous Infusions: . norepinephrine (LEVOPHED) Adult infusion Stopped (10/13/15 1015)    Active Problems:   Pressure ulcer   Sepsis (HCC)   UTI (lower urinary tract infection)   Acute renal failure (ARF) (HCC)   Septic shock (HCC)    Time spent: 25 min    Jeralyn Bennett  [facility] Pager [phone]. If 7PM-7AM, please contact night-coverage at [email or web address], password [facility] 10/16/2015, 2:33 PM  LOS: 4 days

## 2015-10-16 NOTE — Progress Notes (Signed)
ANTIBIOTIC CONSULT NOTE - FOLLOW UP  Pharmacy Consult for vanc/ceftaz Indication: sepsis  Allergies  Allergen Reactions  . Amlodipine Swelling  . Penicillins Swelling    Has patient had a PCN reaction causing immediate rash, facial/tongue/throat swelling, SOB or lightheadedness with hypotension: Yes Has tolerated cephalosporins in the past Has patient had a PCN reaction causing severe rash involving mucus membranes or skin necrosis: No Has patient had a PCN reaction that required hospitalization No Has patient had a PCN reaction occurring within the last 10 years: No If all of the above answers are "NO", then may proceed with Cephalosporin use.  . Latex Rash    Unknown   . Other Other (See Comments)    Natural Rubber- Unknown     Patient Measurements: Height: 5\' 3"  (160 cm) Weight: 178 lb 2.1 oz (80.8 kg) IBW/kg (Calculated) : 52.4  Vital Signs: Temp: 98.5 F (36.9 C) (12/11 0426) Temp Source: Oral (12/11 0426) BP: 150/109 mmHg (12/11 0426) Pulse Rate: 97 (12/11 0426) Intake/Output from previous day: 12/10 0701 - 12/11 0700 In: 788 [P.O.:118; IV Piggyback:670] Out: 600 [Urine:600] Intake/Output from this shift:    Labs:  Recent Labs  10/14/15 0415 10/14/15 1809 10/14/15 2115 10/15/15 0347 10/16/15 0445  WBC 6.1  --   --  9.7 7.6  HGB 6.5*  --  8.2* 8.5* 8.9*  PLT 236  --   --  251 264  CREATININE 1.48* 1.36*  --  1.22* 0.89   Estimated Creatinine Clearance: 77.9 mL/min (by C-G formula based on Cr of 0.89). No results for input(s): VANCOTROUGH, VANCOPEAK, VANCORANDOM, GENTTROUGH, GENTPEAK, GENTRANDOM, TOBRATROUGH, TOBRAPEAK, TOBRARND, AMIKACINPEAK, AMIKACINTROU, AMIKACIN in the last 72 hours.   Microbiology: Recent Results (from the past 720 hour(s))  Blood culture (routine x 2)     Status: None   Collection Time: 10/12/15  4:00 PM  Result Value Ref Range Status   Specimen Description BLOOD RIGHT ARM  Final   Special Requests BOTTLES DRAWN AEROBIC AND  ANAEROBIC 5CC  Final   Culture  Setup Time   Final    GRAM POSITIVE COCCI IN CLUSTERS ANAEROBIC BOTTLE ONLY CRITICAL RESULT CALLED TO, READ BACK BY AND VERIFIED WITH: Erby PianC SMITH RN 2006 10/13/15 A BROWNING    Culture   Final    STAPHYLOCOCCUS SPECIES (COAGULASE NEGATIVE) THE SIGNIFICANCE OF ISOLATING THIS ORGANISM FROM A SINGLE SET OF BLOOD CULTURES WHEN MULTIPLE SETS ARE DRAWN IS UNCERTAIN. PLEASE NOTIFY THE MICROBIOLOGY DEPARTMENT WITHIN ONE WEEK IF SPECIATION AND SENSITIVITIES ARE REQUIRED.    Report Status 10/14/2015 FINAL  Final  Urine culture     Status: None   Collection Time: 10/12/15  4:55 PM  Result Value Ref Range Status   Specimen Description URINE, RANDOM  Final   Special Requests NONE  Final   Culture MULTIPLE SPECIES PRESENT, SUGGEST RECOLLECTION  Final   Report Status 10/13/2015 FINAL  Final  MRSA PCR Screening     Status: None   Collection Time: 10/12/15  9:49 PM  Result Value Ref Range Status   MRSA by PCR NEGATIVE NEGATIVE Final    Comment:        The GeneXpert MRSA Assay (FDA approved for NASAL specimens only), is one component of a comprehensive MRSA colonization surveillance program. It is not intended to diagnose MRSA infection nor to guide or monitor treatment for MRSA infections.   Blood culture (routine x 2)     Status: None (Preliminary result)   Collection Time: 10/12/15 10:12 PM  Result  Value Ref Range Status   Specimen Description BLOOD CENTRAL LINE  Final   Special Requests BOTTLES DRAWN AEROBIC AND ANAEROBIC 10CC  Final   Culture NO GROWTH 4 DAYS  Final   Report Status PENDING  Incomplete  Wound culture     Status: None (Preliminary result)   Collection Time: 10/13/15  5:55 AM  Result Value Ref Range Status   Specimen Description WOUND  Final   Special Requests WOUND SACRUM  Final   Gram Stain   Final    FEW WBC PRESENT,BOTH PMN AND MONONUCLEAR NO SQUAMOUS EPITHELIAL CELLS SEEN NO ORGANISMS SEEN Performed at [facility]     Culture   Final    MODERATE STAPHYLOCOCCUS AUREUS Note: RIFAMPIN AND GENTAMICIN SHOULD NOT BE USED AS SINGLE DRUGS FOR TREATMENT OF STAPH INFECTIONS. Performed at [facility]    Report Status PENDING  Incomplete  Urine culture     Status: None   Collection Time: 10/13/15  6:01 PM  Result Value Ref Range Status   Specimen Description URINE, CATHETERIZED  Final   Special Requests NONE  Final   Culture 8,000 COLONIES/mL INSIGNIFICANT GROWTH  Final   Report Status 10/14/2015 FINAL  Final    Anti-infectives    Start     Dose/Rate Route Frequency Ordered Stop   10/16/15 1800  vancomycin (VANCOCIN) IVPB 1000 mg/200 mL premix     1,000 mg 200 mL/hr over 60 Minutes Intravenous Every 12 hours 10/16/15 1340     10/14/15 1700  levofloxacin (LEVAQUIN) IVPB 500 mg  Status:  Discontinued     500 mg 100 mL/hr over 60 Minutes Intravenous Every 48 hours 10/12/15 2125 10/14/15 1136   10/14/15 1200  cefTAZidime (FORTAZ) 2 g in dextrose 5 % 50 mL IVPB     2 g 100 mL/hr over 30 Minutes Intravenous Every 12 hours 10/14/15 1144     10/14/15 1145  aztreonam (AZACTAM) 1 g in dextrose 5 % 50 mL IVPB  Status:  Discontinued     1 g 100 mL/hr over 30 Minutes Intravenous 3 times per day 10/14/15 1135 10/14/15 1144   10/13/15 1800  vancomycin (VANCOCIN) IVPB 1000 mg/200 mL premix  Status:  Discontinued     1,000 mg 200 mL/hr over 60 Minutes Intravenous Every 24 hours 10/13/15 1019 10/16/15 1340   10/13/15 0330  aztreonam (AZACTAM) 1 g in dextrose 5 % 50 mL IVPB  Status:  Discontinued     1 g 100 mL/hr over 30 Minutes Intravenous Every 8 hours 10/12/15 1959 10/12/15 2111   10/12/15 2130  fluconazole (DIFLUCAN) IVPB 200 mg  Status:  Discontinued     200 mg 100 mL/hr over 60 Minutes Intravenous Every 24 hours 10/12/15 2102 10/14/15 1201   10/12/15 2115  aztreonam (AZACTAM) 2 g in dextrose 5 % 50 mL IVPB  Status:  Discontinued     2 g 100 mL/hr over 30 Minutes Intravenous  Once 10/12/15 2111 10/12/15  2123   10/12/15 2000  vancomycin (VANCOCIN) 500 mg in sodium chloride 0.9 % 100 mL IVPB     500 mg 100 mL/hr over 60 Minutes Intravenous  Once 10/12/15 1957 10/12/15 2244   10/12/15 1715  levofloxacin (LEVAQUIN) IVPB 750 mg     750 mg 100 mL/hr over 90 Minutes Intravenous  Once 10/12/15 1713 10/12/15 1906   10/12/15 1715  aztreonam (AZACTAM) 2 g in dextrose 5 % 50 mL IVPB     2 g 100 mL/hr over 30 Minutes  Intravenous  Once 10/12/15 1713 10/12/15 2008   10/12/15 1715  vancomycin (VANCOCIN) IVPB 1000 mg/200 mL premix     1,000 mg 200 mL/hr over 60 Minutes Intravenous  Once 10/12/15 1713 10/12/15 1906      Assessment: D#4 of empiric abx for sepsis due to probable UTI. On Vanc/ceftaz. Afeb, WBC WNL. Considerable renal function improvement. Peak SCr 2.51, now SCr 0.89 and CrCl ~ 78 mL/min. UOP 0.3 ml/kg/hr  Wound cx 12/8 > mod staph aureus Blood cx 12/7 > 1/2 GPC in clusters coag neg staph Urine cx 12/7 > multiple species; suggest recollect Urine cx 12/8 > insignificant growth MRSA PCR neg  Goal of Therapy:  Vancomycin trough level 15-20 mcg/ml  Plan:  Change vancomycin 1g IV Q12 due to renal function improvement  Continue ceftazidime 2g IV Q12 Monitor clinical picture, renal function, VT prn F/U de-escalation of abx  Sandi Carne, PharmD Pharmacy Resident Pager: [phone] 10/16/2015,1:40 PM

## 2015-10-17 LAB — WOUND CULTURE

## 2015-10-17 LAB — CULTURE, BLOOD (ROUTINE X 2): Culture: NO GROWTH

## 2015-10-17 LAB — BASIC METABOLIC PANEL
BUN: 32 mg/dL — AB (ref 6–20)
CO2: 16 mmol/L — AB (ref 22–32)
CREATININE: 0.91 mg/dL (ref 0.44–1.00)
Calcium: 8.8 mg/dL — ABNORMAL LOW (ref 8.9–10.3)
GFR calc Af Amer: >60 mL/min (ref >60)
GFR calc non Af Amer: >60 mL/min (ref >60)
GLUCOSE: 79 mg/dL (ref 65–99)
Potassium: 3.6 mmol/L (ref 3.5–5.1)
SODIUM: 157 mmol/L — AB (ref 135–145)

## 2015-10-17 LAB — CBC
HCT: 27 % — ABNORMAL LOW (ref 36.0–46.0)
Hemoglobin: 8.8 g/dL — ABNORMAL LOW (ref 12.0–15.0)
MCH: 28.9 pg (ref 26.0–34.0)
MCHC: 32.6 g/dL (ref 30.0–36.0)
MCV: 88.5 fL (ref 78.0–100.0)
PLATELETS: 247 10*3/uL (ref 150–400)
RBC: 3.05 MIL/uL — ABNORMAL LOW (ref 3.87–5.11)
RDW: 19.8 % — AB (ref 11.5–15.5)
WBC: 9.4 10*3/uL (ref 4.0–10.5)

## 2015-10-17 LAB — OCCULT BLOOD X 1 CARD TO LAB, STOOL: Fecal Occult Bld: NEGATIVE

## 2015-10-17 MED ORDER — HYDRALAZINE HCL 20 MG/ML IJ SOLN: 10.0000 mg | INTRAMUSCULAR | Status: DC | PRN

## 2015-10-17 MED ORDER — LACOSAMIDE 50 MG PO TABS
100.0000 mg | ORAL_TABLET | Freq: Two times a day (BID) | ORAL | Status: DC
  Administered 2015-10-17 – 2015-10-26 (×18): 100 mg via ORAL
  Filled 2015-10-17 (×18): qty 2

## 2015-10-17 MED ORDER — LEVETIRACETAM 100 MG/ML PO SOLN
1500.0000 mg | Freq: Two times a day (BID) | ORAL | Status: DC
  Administered 2015-10-17 – 2015-10-26 (×18): 1500 mg via ORAL
  Filled 2015-10-17 (×19): qty 15

## 2015-10-17 MED ORDER — HYDRALAZINE HCL 20 MG/ML IJ SOLN
10.0000 mg | INTRAMUSCULAR | Status: AC
  Administered 2015-10-18: 10 mg via INTRAVENOUS

## 2015-10-17 MED ORDER — FAMOTIDINE 40 MG/5ML PO SUSR
20.0000 mg | Freq: Two times a day (BID) | ORAL | Status: DC
  Administered 2015-10-17 – 2015-10-26 (×18): 20 mg via ORAL
  Filled 2015-10-17 (×21): qty 2.5

## 2015-10-17 MED ORDER — SODIUM CHLORIDE 0.9 % IV SOLN
250.0000 mL | INTRAVENOUS | Status: DC | PRN
  Administered 2015-10-17: 250 mL via INTRAVENOUS

## 2015-10-17 MED ORDER — HYDRALAZINE HCL 20 MG/ML IJ SOLN
5.0000 mg | INTRAMUSCULAR | Status: DC | PRN
  Administered 2015-10-19 – 2015-10-26 (×5): 5 mg via INTRAVENOUS
  Filled 2015-10-17 (×7): qty 1

## 2015-10-17 MED ORDER — FREE WATER
200.0000 mL | Freq: Three times a day (TID) | Status: DC
  Administered 2015-10-17: 200 mL

## 2015-10-17 MED ORDER — DEXTROSE 5 % IV SOLN
INTRAVENOUS | Status: DC
  Administered 2015-10-17 – 2015-10-21 (×5): via INTRAVENOUS

## 2015-10-17 MED ORDER — LEVETIRACETAM 750 MG PO TABS
1500.0000 mg | ORAL_TABLET | Freq: Two times a day (BID) | ORAL | Status: DC
  Filled 2015-10-17: qty 2

## 2015-10-17 MED ORDER — FREE WATER
200.0000 mL | Freq: Three times a day (TID) | Status: DC
  Administered 2015-10-21 – 2015-10-26 (×14): 200 mL via ORAL

## 2015-10-17 NOTE — Care Management Note (Signed)
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 Patient is nonverbal. NCM will cont to follow.                  Action/Plan:   Expected Discharge Date:                  Expected Discharge Plan:  Skilled Nursing Facility  In-House Referral:  Clinical Social Work  Discharge planning Services  CM Consult  Post Acute Care Choice:    Choice offered to:     DME Arranged:    DME Agency:     HH Arranged:    HH Agency:     Status of Service:  Completed, signed off  Medicare Important Message Given:    Date Medicare IM Given:    Medicare IM give by:    Date Additional Medicare IM Given:    Additional Medicare Important Message give by:     If discussed at Long Length of Stay Meetings, dates discussed:    Additional Comments:  [name], Ina Scrivens [place], RN 10/17/2015, 12:26 PM

## 2015-10-17 NOTE — Progress Notes (Signed)
Notified MD in regards to patient having blood in stool. Currently on SQ Heparin. Awaiting return page.

## 2015-10-17 NOTE — Progress Notes (Signed)
Palliative Medicine Team consult was received.   I called spoke with the patient's son and we are planning on having a family meeting tomorrow at  2:30pm. If patient is to be discharged before this time, please let me know and we will see if her son can rearrange schedule in order to meet earlier.  If there are urgent needs or questions please call [phone]. Thank you for consulting out team to assist with this patients care.  Romie MinusGene Zailen Albarran, MD [facility] Palliative Medicine Team [phone]

## 2015-10-17 NOTE — NC FL2 (Signed)
[employer] MEDICAID FL2 LEVEL OF CARE SCREENING TOOL     IDENTIFICATION  Patient Name: [name] Birthdate: [birth year] Sex: female Admission Date (Current Location): 10/12/2015  [facility] and [place] Number: Producer, television/film/video and Address:  [facility]. [facility], [address] [place], [place] [address]      Provider Number: [id number]  Attending Physician Name and Address:  Jeralyn Bennett, MD  Relative Name and Phone Number:  Gaynelle Adu, [phone]    Current Level of Care: Hospital Recommended Level of Care: Skilled Nursing Facility Prior Approval Number:    Date Approved/Denied:   PASRR Number: [id number]  Discharge Plan: SNF    Current Diagnoses: Patient Active Problem List   Diagnosis Date Noted  . Septic shock (HCC) 10/13/2015  . Acute renal failure (ARF) (HCC)   . DNR (do not resuscitate) discussion   . Adult failure to thrive   . Palliative care encounter 08/29/2015  . Sepsis (HCC) 08/28/2015  . UTI (lower urinary tract infection) 08/28/2015  . Decubitus ulcer of sacral region, stage 4 (HCC) 08/28/2015  . Abscess, sacrum (HCC) 08/28/2015  . Pressure ulcer 06/02/2015  . SAH (subarachnoid hemorrhage) (HCC) 06/01/2015  . Acute encephalopathy 06/01/2015  . Localization-related symptomatic epilepsy and epileptic syndromes with complex partial seizures, not intractable, without status epilepticus (HCC) 05/06/2015  . Hx of ischemic right ACA stroke 05/06/2015  . History of ischemic left ACA stroke 05/06/2015  . C. difficile diarrhea 03/20/2015  . Difficult intravenous access   . Dysphagia   . Hypernatremia 03/16/2015  . Dehydration 03/16/2015  . Seizures (HCC) 07/05/2014  . Stroke (HCC) 07/05/2014  . Chronic respiratory failure (HCC) 04/07/2014  . Tracheitis 03/31/2014  . Pneumonia 03/31/2014  . HCAP (healthcare-associated pneumonia) 03/07/2014  . DVT (deep venous thrombosis) (HCC) 03/07/2014  . BP (high blood pressure)  09/26/2013  . Aneurysm (HCC) 09/25/2013  . Brain aneurysm 08/17/2013  . Occipital neuralgia 08/17/2013  . Sinus headache 08/17/2013  . Headache(784.0) 06/02/2013  . Palpitations 03/04/2013  . Edema 03/04/2013  . Anxiety state, unspecified 03/04/2013  . Tobacco abuse 02/27/2013  . Chest discomfort 02/10/2013  . Lower abdominal pain 05/01/2012  . Allergic rhinitis 05/01/2012  . HTN (hypertension) 02/25/2012  . Obesity 02/25/2012    Orientation RESPIRATION BLADDER Height & Weight     (Unable to follow commands but responds to voice)  Normal Continent, Indwelling catheter (Urinary catheter)  (170.2 cm) 178 lbs.  BEHAVIORAL SYMPTOMS/MOOD NEUROLOGICAL BOWEL NUTRITION STATUS      Incontinent  (See DC Summary)  AMBULATORY STATUS COMMUNICATION OF NEEDS Skin    (Nonambulatory) Non-Verbally ([place]) PU Stage and Appropriate Care (Stage 1 on hip; Stage 2 on sacrum; Deep tissue injury on foot)       PU Stage 4 Dressing:  (See Wound Care note; on sacrum)               Personal Care Assistance Level of Assistance  Total care       Total Care Assistance: Maximum assistance   Functional Limitations Info  Speech ([place])          SPECIAL CARE FACTORS FREQUENCY                       Contractures Contractures Info: Not present    Additional Factors Info  Code Status, Allergies Code Status Info: Full Allergies Info: Amlodipine, Penicillins, Latex, Other           Current Medications (10/17/2015):  This is the current hospital active medication list Current Facility-Administered Medications  Medication Dose Route Frequency Provider Last Rate Last Dose  . acetaminophen (TYLENOL) tablet 650 mg  650 mg Oral Q6H PRN Nyoka CowdenMichael B Wert, MD   650 mg at 10/15/15 1808  . cefUROXime (CEFTIN) 250 MG/5ML suspension 250 mg  250 mg Oral Q12H Jeralyn BennettEzequiel Zamora, MD   250 mg at 10/16/15 2239  . dextrose 5 % solution   Intravenous Continuous Jeralyn BennettEzequiel Zamora, MD 50 mL/hr at 10/17/15 1159     . famotidine (PEPCID) 40 MG/5ML suspension 20 mg  20 mg Oral BID Herby AbrahamMichelle T Bell, RPH      . feeding supplement (ENSURE ENLIVE) (ENSURE ENLIVE) liquid 237 mL  237 mL Oral TID BM Idell PicklesKimberly A Harris, RD   237 mL at 10/17/15 1000  . free water 200 mL  200 mL Oral 3 times per day Jeralyn BennettEzequiel Zamora, MD   200 mL at 10/17/15 1402  . lacosamide (VIMPAT) tablet 100 mg  100 mg Oral BID Herby AbrahamMichelle T Bell, RPH      . levETIRAcetam (KEPPRA) tablet 1,500 mg  1,500 mg Oral BID Herby AbrahamMichelle T Bell, RPH      . lisinopril (PRINIVIL,ZESTRIL) tablet 20 mg  20 mg Oral Daily Jeralyn BennettEzequiel Zamora, MD   20 mg at 10/17/15 1044  . metoprolol (LOPRESSOR) tablet 50 mg  50 mg Oral BID Jeralyn BennettEzequiel Zamora, MD   50 mg at 10/17/15 1044  . multivitamin with minerals tablet 1 tablet  1 tablet Oral Daily Idell PicklesKimberly A Harris, RD   1 tablet at 10/17/15 1044  . ondansetron (ZOFRAN) injection 4 mg  4 mg Intravenous Q6H PRN Praveen Mannam, MD   4 mg at 10/12/15 2240  . sodium chloride 0.9 % injection 10-40 mL  10-40 mL Intracatheter PRN Nelda Bucksaniel J Feinstein, MD   20 mL at 10/16/15 2113     Discharge Medications: Please see discharge summary for a list of discharge medications.  Relevant Imaging Results:  Relevant Lab Results:   Additional Information [id number]  Mearl LatinNadia S Schae Cando, LCSWA

## 2015-10-17 NOTE — Progress Notes (Signed)
VSS - Blood pressure 172/132, pulse 101, temperature 97.3 F (36.3 C), temperature source Oral, resp. rate 16, height 5\' 3"  (1.6 m), weight 80.8 kg (178 lb 2.1 oz), SpO2 99 %., R/A.  Re- checked manually and was 170/120.  Texp paged Craige CottaKirby, NP.  Will continue to monitor.  Forbes Cellarelcine Afifa Truax, RN

## 2015-10-17 NOTE — Progress Notes (Signed)
[facility] PROGRESS NOTE  Meghan PoreLaura Marie Lawson ZOX:[id number] DOB: 07/08/1966 DOA: 10/12/2015 PCP: [name], MD  Assessment/Plan: 1. Septic shock -Evidenced by blood pressure of 76/15, temperature of 94.2, respiratory of 31, requiring IV pressor support. She was initially admitted to the intensive care unit under the care of pulmonary critical care medicine. -Suspect source of infection to be urinary tract infection -Blood cultures drawn on 10/12/2015 growing coag negative staph from 1/2 bottles. Suspect that this reflects contaminant.  -Urine cultures unremarkable. -Transition to Ceftin 250 mg PO BID  2.  Acute kidney injury. -She initially presented with a BUN of 113 and creatinine of 3.15, likely secondary to septic shock. -Creatinine has gradually trended down with aggressive IV fluid resuscitation. A.m. lab work on 10/17/2015 showing creatinine of 0.91 with BUN of 32.  3. Question hematochezia -Overnight nursing staff reporting bloody stools, found to be Guaiac negative -Her Hg remains stable at 8.8<8.9<8.5<8.2 -I don't think she is actively bleeding, however will continue to monitor  3.  Acute encephalopathy -Patient found by nursing staff to be minimally responsive -Patient showing failure to thrive, having minimal PO intake.   4.  Hypernatremia. -On presentation had a sodium of 163. -Her sodium now trending up, with am labs showing sodium of 157 (154 on 10/16/2015) -This could be related to hypovolemia with her minimal intake. I ordered free water TID however she has not taken very much today  5.  History of DVT. -Currently not on anticoagulation due to history of GI bleed. -Status post IVC filter placement  6.  Stage IV decubitus ulcer -Patient was evaluated by wound care during this hospitalization, recommended continuing saline moist to moist dressings, adding prevalon boots. -Ordered air mattress   7.  Hypokalemia -AM labs showing K of 3.2  -Will give KCl 40  meq   8. History of HTN -Blood pressures are now elevated with SBP's in the 150's/100's  -Increaseed metoprolol to 50 mg PO BID and add lisinopril 20 mg PO q daily  9. Nutrition -She has had minimal PO intake in the last several days.   -She had been treated with broad spectrum antibiotics with IV Fortaz and IV Vancomycin for urosepsis.  It seems unlikely that she would have developed another infection while being treated with broad spectrum antimicrobials to explain her failure to thrive. I evaluated sacral decub which did not appear actively infected.  Spoke with Son over telephone, felt that she would benefit from a Palliative Care consult. Case was discussed with Dr Neale BurlyFreeman who will evaluate patient   Code Status: Full code Family Communication: Attempted to call family left voicemail Disposition Plan: Anticipate discharge to skilled nurse facility the next 24 hours   Consultants:  Pulmonary critical care medicine  Palliative care  Antibiotics:  IV vancomycin stopped on 10/16/2015  IV ceftazidime stopped on 10/16/2015  Ceftin started on 10/16/2015  HPI/Subjective: [name] is a 49 year old female with a past medical history ruptured cerebral aneurysm in 2014, with resultant paraplegia, nonverbal at baseline, current resident at skilled nursing facility, [facility] when she was found by nursing staff to be lethargic on admission she was septic evidence by a pressure of 76/15, temperature of 94.2, respiratory rate of 31. She was admitted to the pulmonary critical care service. She required IV pressors support. She showed clinical improvement and was transferred to the medicine service on 10/15/2015.  Objective: Filed Vitals:   10/17/15 0604 10/17/15 1420  BP: 148/107  145/118  Pulse: 83 74  Temp:    Resp:      Intake/Output Summary (Last 24 hours) at 10/17/15 1730 Last data filed at 10/17/15 1419  Gross per 24 hour   Intake     50 ml  Output    851 ml  Net   -801 ml   Filed Weights   10/13/15 0500 10/14/15 0500 10/15/15 0500  Weight: 80.7 kg (177 lb 14.6 oz) 83.8 kg (184 lb 11.9 oz) 80.8 kg (178 lb 2.1 oz)    Exam:   General:  Patient is awake and alert, nonverbal, not following commands  Cardiovascular: Regular rate and rhythm, normal S1S2  Respiratory: Having a normal inspiratory effort, no crackles or rhonchi  Abdomen: Soft, nontender nondistended  Musculoskeletal: I evaluated  8 cm x 6 cm stage IV decub ulcer, packing removed, bone exposed. No evidence of active infection.   Data Reviewed: Basic Metabolic Panel:  Recent Labs Lab 10/13/15 0450  10/14/15 0415 10/14/15 1809 10/15/15 0347 10/16/15 0445 10/17/15 0644  NA  --   < > 153* 153* 154* 154* 157*  K  --   < > 3.7 4.0 3.3* 3.2* 3.6  CL  --   < > >130* >130* >130* >130* >130*  CO2  --   < > 14* 14* 16* 16* 16*  GLUCOSE  --   < > 108* 147* 82 81 79  BUN  --   < > 68* 64* 58* 42* 32*  CREATININE  --   < > 1.48* 1.36* 1.22* 0.89 0.91  CALCIUM  --   < > 7.3* 7.9* 8.1* 8.7* 8.8*  MG 1.7  --   --   --   --   --   --   PHOS 1.9*  --   --   --   --   --   --   < > = values in this interval not displayed. Liver Function Tests:  Recent Labs Lab 10/12/15 1600 10/12/15 2145  AST 25 16  ALT 17 19  ALKPHOS 72 69  BILITOT 0.4 0.3  PROT 6.3* 5.7*  ALBUMIN 2.3* 2.0*    Recent Labs Lab 10/12/15 1600  LIPASE 23    Recent Labs Lab 10/12/15 2142  AMMONIA 23   CBC:  Recent Labs Lab 10/12/15 1600 10/13/15 0450 10/14/15 0415 10/14/15 2115 10/15/15 0347 10/16/15 0445 10/17/15 0644  WBC 9.4 8.4 6.1  --  9.7 7.6 9.4  NEUTROABS 5.9  --  3.8  --  4.7  --   --   HGB 7.3* 7.0* 6.5* 8.2* 8.5* 8.9* 8.8*  HCT 24.2* 22.4* 20.9* 26.0* 26.3* 27.3* 27.0*  MCV 92.7 91.8 91.3  --  87.7 87.5 88.5  PLT 279 254 236  --  251 264 247   Cardiac Enzymes: No results for input(s): CKTOTAL, CKMB, CKMBINDEX, TROPONINI in the last 168  hours. BNP (last 3 results)  Recent Labs  03/18/15 1240 08/29/15 1305  BNP 69.7 47.2    ProBNP (last 3 results) No results for input(s): PROBNP in the last 8760 hours.  CBG:  Recent Labs Lab 10/12/15 2058  GLUCAP 140*    Recent Results (from the past 240 hour(s))  Blood culture (routine x 2)     Status: None   Collection Time: 10/12/15  4:00 PM  Result Value Ref Range Status   Specimen Description BLOOD RIGHT ARM  Final   Special Requests BOTTLES DRAWN AEROBIC AND ANAEROBIC 5CC  Final  Culture  Setup Time   Final    GRAM POSITIVE COCCI IN CLUSTERS ANAEROBIC BOTTLE ONLY CRITICAL RESULT CALLED TO, READ BACK BY AND VERIFIED WITH: Erby Pian RN 2006 10/13/15 A BROWNING    Culture   Final    STAPHYLOCOCCUS SPECIES (COAGULASE NEGATIVE) THE SIGNIFICANCE OF ISOLATING THIS ORGANISM FROM A SINGLE SET OF BLOOD CULTURES WHEN MULTIPLE SETS ARE DRAWN IS UNCERTAIN. PLEASE NOTIFY THE MICROBIOLOGY DEPARTMENT WITHIN ONE WEEK IF SPECIATION AND SENSITIVITIES ARE REQUIRED.    Report Status 10/14/2015 FINAL  Final  Urine culture     Status: None   Collection Time: 10/12/15  4:55 PM  Result Value Ref Range Status   Specimen Description URINE, RANDOM  Final   Special Requests NONE  Final   Culture MULTIPLE SPECIES PRESENT, SUGGEST RECOLLECTION  Final   Report Status 10/13/2015 FINAL  Final  MRSA PCR Screening     Status: None   Collection Time: 10/12/15  9:49 PM  Result Value Ref Range Status   MRSA by PCR NEGATIVE NEGATIVE Final    Comment:        The GeneXpert MRSA Assay (FDA approved for NASAL specimens only), is one component of a comprehensive MRSA colonization surveillance program. It is not intended to diagnose MRSA infection nor to guide or monitor treatment for MRSA infections.   Blood culture (routine x 2)     Status: None   Collection Time: 10/12/15 10:12 PM  Result Value Ref Range Status   Specimen Description BLOOD CENTRAL LINE  Final   Special Requests BOTTLES DRAWN  AEROBIC AND ANAEROBIC 10CC  Final   Culture NO GROWTH 5 DAYS  Final   Report Status 10/17/2015 FINAL  Final  Wound culture     Status: None   Collection Time: 10/13/15  5:55 AM  Result Value Ref Range Status   Specimen Description WOUND  Final   Special Requests WOUND SACRUM  Final   Gram Stain   Final    FEW WBC PRESENT,BOTH PMN AND MONONUCLEAR NO SQUAMOUS EPITHELIAL CELLS SEEN NO ORGANISMS SEEN Performed at [facility]    Culture   Final    MODERATE METHICILLIN RESISTANT STAPHYLOCOCCUS AUREUS Note: RIFAMPIN AND GENTAMICIN SHOULD NOT BE USED AS SINGLE DRUGS FOR TREATMENT OF STAPH INFECTIONS. CRITICAL RESULT CALLED TO, READ BACK BY AND VERIFIED WITH: KELLY DUFFY 10/17/15 1012 BY SMITHERSJ Performed at [facility]    Report Status 10/17/2015 FINAL  Final   Organism ID, Bacteria METHICILLIN RESISTANT STAPHYLOCOCCUS AUREUS  Final      Susceptibility   Methicillin resistant staphylococcus aureus - MIC*    CLINDAMYCIN >=8 RESISTANT Resistant     ERYTHROMYCIN >=8 RESISTANT Resistant     GENTAMICIN <=0.5 SENSITIVE Sensitive     LEVOFLOXACIN >=8 RESISTANT Resistant     OXACILLIN >=4 RESISTANT Resistant     RIFAMPIN <=0.5 SENSITIVE Sensitive     TRIMETH/SULFA <=10 SENSITIVE Sensitive     VANCOMYCIN 1 SENSITIVE Sensitive     TETRACYCLINE <=1 SENSITIVE Sensitive     * MODERATE METHICILLIN RESISTANT STAPHYLOCOCCUS AUREUS  Urine culture     Status: None   Collection Time: 10/13/15  6:01 PM  Result Value Ref Range Status   Specimen Description URINE, CATHETERIZED  Final   Special Requests NONE  Final   Culture 8,000 COLONIES/mL INSIGNIFICANT GROWTH  Final   Report Status 10/14/2015 FINAL  Final     Studies: No results found.  Scheduled Meds: . cefUROXime  250 mg Oral Q12H  .  famotidine  20 mg Oral BID  . feeding supplement (ENSURE ENLIVE)  237 mL Oral TID BM  . free water  200 mL Oral 3 times per day  . lacosamide  100 mg Oral BID  . levETIRAcetam  1,500 mg Oral  BID  . lisinopril  20 mg Oral Daily  . metoprolol tartrate  50 mg Oral BID  . multivitamin with minerals  1 tablet Oral Daily   Continuous Infusions: . dextrose 50 mL/hr at 10/17/15 1159    Active Problems:   Pressure ulcer   Sepsis (HCC)   UTI (lower urinary tract infection)   Acute renal failure (ARF) (HCC)   Septic shock (HCC)    Time spent: 25 min    Jeralyn Bennett  [facility] Pager [phone]. If 7PM-7AM, please contact night-coverage at [email or web address], password [facility] 10/17/2015, 5:30 PM  LOS: 5 days

## 2015-10-18 DIAGNOSIS — R627 Adult failure to thrive: Secondary | ICD-10-CM

## 2015-10-18 DIAGNOSIS — Z515 Encounter for palliative care: Secondary | ICD-10-CM

## 2015-10-18 DIAGNOSIS — Z7189 Other specified counseling: Secondary | ICD-10-CM

## 2015-10-18 DIAGNOSIS — L89154 Pressure ulcer of sacral region, stage 4: Secondary | ICD-10-CM

## 2015-10-18 LAB — CBC
HCT: 27.8 % — ABNORMAL LOW (ref 36.0–46.0)
HEMOGLOBIN: 8.9 g/dL — AB (ref 12.0–15.0)
MCH: 28.2 pg (ref 26.0–34.0)
MCHC: 32 g/dL (ref 30.0–36.0)
MCV: 88 fL (ref 78.0–100.0)
Platelets: 192 10*3/uL (ref 150–400)
RBC: 3.16 MIL/uL — AB (ref 3.87–5.11)
RDW: 20.1 % — AB (ref 11.5–15.5)
WBC: 7.4 10*3/uL (ref 4.0–10.5)

## 2015-10-18 LAB — BASIC METABOLIC PANEL
BUN: 20 mg/dL (ref 6–20)
CO2: 18 mmol/L — AB (ref 22–32)
CREATININE: 0.7 mg/dL (ref 0.44–1.00)
Calcium: 8.8 mg/dL — ABNORMAL LOW (ref 8.9–10.3)
GFR calc non Af Amer: >60 mL/min (ref >60)
Glucose, Bld: 114 mg/dL — ABNORMAL HIGH (ref 65–99)
POTASSIUM: 3.2 mmol/L — AB (ref 3.5–5.1)
SODIUM: 154 mmol/L — AB (ref 135–145)

## 2015-10-18 MED ORDER — POTASSIUM CHLORIDE 10 MEQ/100ML IV SOLN
10.0000 meq | INTRAVENOUS | Status: AC
  Administered 2015-10-18 (×4): 10 meq via INTRAVENOUS
  Filled 2015-10-18 (×4): qty 100

## 2015-10-18 NOTE — Clinical Documentation Improvement (Signed)
Internal Medicine  Can the diagnosis of Urosepsis be further specified? The term "urosepsis" is not synonymous with sepsis.   Sepsis secondary to UTI  UTI without sepsis  Other  Clinically Undetermined  Supporting Information:  NOTED 10/17/15:"She had been treated with broad spectrum antibiotics with IV Fortaz and IV Vancomycin for urosepsis"  Please exercise your independent, professional judgment when responding. A specific answer is not anticipated or expected. Please update your documentation within the medical record to reflect your response to this query. Thank you  Thank Barrie DunkerYou,  Emmalise Huard C Blenda [facility]  [phone]

## 2015-10-18 NOTE — Progress Notes (Signed)
Dr Vanessa BarbaraZamora made aware of critical Cl=>130 no new orders

## 2015-10-18 NOTE — Progress Notes (Signed)
[facility] PROGRESS NOTE  [name] EAV:[id number] DOB: Aug 02, 1966 DOA: 10/12/2015 PCP: Ruthe Mannan, MD  Assessment/Plan: 1. Septic shock -Evidenced by blood pressure of 76/15, temperature of 94.2, respiratory of 31, requiring IV pressor support. She was initially admitted to the intensive care unit under the care of pulmonary critical care medicine. -Suspect source of infection to be urinary tract infection -Blood cultures drawn on 10/12/2015 growing coag negative staph from 1/2 bottles. Suspect that this reflects contaminant.  -Urine cultures unremarkable. -Transition to Ceftin 250 mg PO BID  2.  Acute kidney injury. -She initially presented with a BUN of 113 and creatinine of 3.15, likely secondary to septic shock. -Creatinine has gradually trended down with aggressive IV fluid resuscitation. A.m. lab work on 10/18/2015 showing creatinine of 0.7 with BUN of 20.  3. Question hematochezia -Overnight nursing staff reporting bloody stools, found to be Guaiac negative -Her Hg remains stable at 8.9< 8.8<8.9<8.5<8.2 -I don't think she is actively bleeding, however will continue to monitor  4.  Hypernatremia. -On presentation had a sodium of 163. -Her sodium now trending up, with am labs showing sodium of 157 (154 on 10/16/2015) -This could be related to hypovolemia with her minimal intake. I ordered free water TID however she has not taken very much for which she was started on D5W. On 12/13/206 sodium improved to 154  5.  History of DVT. -Currently not on anticoagulation due to history of GI bleed. -Status post IVC filter placement  6.  Stage IV decubitus ulcer -I evaluated wound, she has an 8 cm x 6 cm sacral decubitus ulcer with exposed bone. There was mild erythema involving margins however she did not have evidence of infection. -Patient was evaluated by wound care during this hospitalization, recommended continuing saline moist to moist dressings, adding prevalon  boots. -Ordered air mattress   7.  Hypokalemia -AM labs showing K of 3.2  -We will give IV potassium replacement  8. History of HTN -Blood pressures are now elevated with SBP's in the 150's/100's  -She is not taking by mouth, will provide IV antihypertensive agents as needed.  9. Failure to thrive -She has had minimal PO intake in the last several days.   -She had been treated with broad spectrum antibiotics with IV Fortaz and IV Vancomycin for urosepsis.  It seems unlikely that she would have developed another infection while being treated with broad spectrum antimicrobials to explain her failure to thrive. I personally evaluated sacral decub which did not appear actively infected. I think that MRSA growing from wound likel y represents a colonizer.  -Workup has not shown a reversible cause of her decline. Labs revealed a white count within normal limits at 7400. K function stable, sodium trended down to 154 from 157. She has been afebrile. Her sats are upper 90s on room air.  -Palliative care was consulted for facilitation indeterminate medical goals of care. Family meeting was held on 10/18/2015. -Family members would like PEG tube placement for nutritional support. Interventional radiology was consulted for PEG placement   Code Status: Full code Family Communication: I spoke to her son at bedside Disposition Plan: Plan for PEG tube placement, IR was consulted   Consultants:  Pulmonary critical care medicine  Palliative care  Interventional radiology  Antibiotics:  IV vancomycin stopped on 10/16/2015  IV ceftazidime stopped on 10/16/2015  Ceftin started on 10/16/2015  HPI/Subjective: [name] is a 49 year old female with a past medical history ruptured cerebral aneurysm in 2014, with resultant  quadriplegia, nonverbal at baseline, current resident at skilled nursing facility, transferred to the emergency department at [facility] when she was found by nursing  staff to be lethargic on admission she was septic evidence by a pressure of 76/15, temperature of 94.2, respiratory rate of 31. She was admitted to the pulmonary critical care service. She required IV pressors support. She showed clinical improvement and was transferred to the medicine service on 10/15/2015. Hospitalization complicated by failure to thrive, having minimal by mouth intake. Palliative care was consulted family meeting held on 10/18/2015 to determine medical goals of care. Family members expressing wishes for placement of PEG tube for nutritional support. Interventional radiology consulted.  Objective: Filed Vitals:   10/18/15 1040 10/18/15 1401  BP: 166/93 167/117  Pulse: 107 95  Temp: 97.8 F (36.6 C) 98 F (36.7 C)  Resp: 16 16    Intake/Output Summary (Last 24 hours) at 10/18/15 1717 Last data filed at 10/18/15 1322  Gross per 24 hour  Intake      0 ml  Output    800 ml  Net   -800 ml   Filed Weights   10/13/15 0500 10/14/15 0500 10/15/15 0500  Weight: 80.7 kg (177 lb 14.6 oz) 83.8 kg (184 lb 11.9 oz) 80.8 kg (178 lb 2.1 oz)    Exam:   General:  Patient is awake and alert, nonverbal, not following commands  Cardiovascular: Regular rate and rhythm, normal S1S2  Respiratory: Having a normal inspiratory effort, no crackles or rhonchi  Abdomen: Soft, nontender nondistended  Musculoskeletal: I evaluated  8 cm x 6 cm stage IV decub ulcer, packing removed, bone exposed. No evidence of active infection.   Data Reviewed: Basic Metabolic Panel:  Recent Labs Lab 10/13/15 0450  10/14/15 1809 10/15/15 0347 10/16/15 0445 10/17/15 0644 10/18/15 1400  NA  --   < > 153* 154* 154* 157* 154*  K  --   < > 4.0 3.3* 3.2* 3.6 3.2*  CL  --   < > >130* >130* >130* >130* >130*  CO2  --   < > 14* 16* 16* 16* 18*  GLUCOSE  --   < > 147* 82 81 79 114*  BUN  --   < > 64* 58* 42* 32* 20  CREATININE  --   < > 1.36* 1.22* 0.89 0.91 0.70  CALCIUM  --   < > 7.9* 8.1* 8.7* 8.8*  8.8*  MG 1.7  --   --   --   --   --   --   PHOS 1.9*  --   --   --   --   --   --   < > = values in this interval not displayed. Liver Function Tests:  Recent Labs Lab 10/12/15 1600 10/12/15 2145  AST 25 16  ALT 17 19  ALKPHOS 72 69  BILITOT 0.4 0.3  PROT 6.3* 5.7*  ALBUMIN 2.3* 2.0*    Recent Labs Lab 10/12/15 1600  LIPASE 23    Recent Labs Lab 10/12/15 2142  AMMONIA 23   CBC:  Recent Labs Lab 10/12/15 1600  10/14/15 0415 10/14/15 2115 10/15/15 0347 10/16/15 0445 10/17/15 0644 10/18/15 1400  WBC 9.4  < > 6.1  --  9.7 7.6 9.4 7.4  NEUTROABS 5.9  --  3.8  --  4.7  --   --   --   HGB 7.3*  < > 6.5* 8.2* 8.5* 8.9* 8.8* 8.9*  HCT 24.2*  < >  20.9* 26.0* 26.3* 27.3* 27.0* 27.8*  MCV 92.7  < > 91.3  --  87.7 87.5 88.5 88.0  PLT 279  < > 236  --  251 264 247 192  < > = values in this interval not displayed. Cardiac Enzymes: No results for input(s): CKTOTAL, CKMB, CKMBINDEX, TROPONINI in the last 168 hours. BNP (last 3 results)  Recent Labs  03/18/15 1240 08/29/15 1305  BNP 69.7 47.2    ProBNP (last 3 results) No results for input(s): PROBNP in the last 8760 hours.  CBG:  Recent Labs Lab 10/12/15 2058  GLUCAP 140*    Recent Results (from the past 240 hour(s))  Blood culture (routine x 2)     Status: None   Collection Time: 10/12/15  4:00 PM  Result Value Ref Range Status   Specimen Description BLOOD RIGHT ARM  Final   Special Requests BOTTLES DRAWN AEROBIC AND ANAEROBIC 5CC  Final   Culture  Setup Time   Final    GRAM POSITIVE COCCI IN CLUSTERS ANAEROBIC BOTTLE ONLY CRITICAL RESULT CALLED TO, READ BACK BY AND VERIFIED WITH: Erby PianC SMITH RN 2006 10/13/15 A BROWNING    Culture   Final    STAPHYLOCOCCUS SPECIES (COAGULASE NEGATIVE) THE SIGNIFICANCE OF ISOLATING THIS ORGANISM FROM A SINGLE SET OF BLOOD CULTURES WHEN MULTIPLE SETS ARE DRAWN IS UNCERTAIN. PLEASE NOTIFY THE MICROBIOLOGY DEPARTMENT WITHIN ONE WEEK IF SPECIATION AND SENSITIVITIES ARE  REQUIRED.    Report Status 10/14/2015 FINAL  Final  Urine culture     Status: None   Collection Time: 10/12/15  4:55 PM  Result Value Ref Range Status   Specimen Description URINE, RANDOM  Final   Special Requests NONE  Final   Culture MULTIPLE SPECIES PRESENT, SUGGEST RECOLLECTION  Final   Report Status 10/13/2015 FINAL  Final  MRSA PCR Screening     Status: None   Collection Time: 10/12/15  9:49 PM  Result Value Ref Range Status   MRSA by PCR NEGATIVE NEGATIVE Final    Comment:        The GeneXpert MRSA Assay (FDA approved for NASAL specimens only), is one component of a comprehensive MRSA colonization surveillance program. It is not intended to diagnose MRSA infection nor to guide or monitor treatment for MRSA infections.   Blood culture (routine x 2)     Status: None   Collection Time: 10/12/15 10:12 PM  Result Value Ref Range Status   Specimen Description BLOOD CENTRAL LINE  Final   Special Requests BOTTLES DRAWN AEROBIC AND ANAEROBIC 10CC  Final   Culture NO GROWTH 5 DAYS  Final   Report Status 10/17/2015 FINAL  Final  Wound culture     Status: None   Collection Time: 10/13/15  5:55 AM  Result Value Ref Range Status   Specimen Description WOUND  Final   Special Requests WOUND SACRUM  Final   Gram Stain   Final    FEW WBC PRESENT,BOTH PMN AND MONONUCLEAR NO SQUAMOUS EPITHELIAL CELLS SEEN NO ORGANISMS SEEN Performed at [facility]    Culture   Final    MODERATE METHICILLIN RESISTANT STAPHYLOCOCCUS AUREUS Note: RIFAMPIN AND GENTAMICIN SHOULD NOT BE USED AS SINGLE DRUGS FOR TREATMENT OF STAPH INFECTIONS. CRITICAL RESULT CALLED TO, READ BACK BY AND VERIFIED WITH: KELLY DUFFY 10/17/15 1012 BY SMITHERSJ Performed at [facility]    Report Status 10/17/2015 FINAL  Final   Organism ID, Bacteria METHICILLIN RESISTANT STAPHYLOCOCCUS AUREUS  Final      Susceptibility  Methicillin resistant staphylococcus aureus - MIC*    CLINDAMYCIN >=8 RESISTANT  Resistant     ERYTHROMYCIN >=8 RESISTANT Resistant     GENTAMICIN <=0.5 SENSITIVE Sensitive     LEVOFLOXACIN >=8 RESISTANT Resistant     OXACILLIN >=4 RESISTANT Resistant     RIFAMPIN <=0.5 SENSITIVE Sensitive     TRIMETH/SULFA <=10 SENSITIVE Sensitive     VANCOMYCIN 1 SENSITIVE Sensitive     TETRACYCLINE <=1 SENSITIVE Sensitive     * MODERATE METHICILLIN RESISTANT STAPHYLOCOCCUS AUREUS  Urine culture     Status: None   Collection Time: 10/13/15  6:01 PM  Result Value Ref Range Status   Specimen Description URINE, CATHETERIZED  Final   Special Requests NONE  Final   Culture 8,000 COLONIES/mL INSIGNIFICANT GROWTH  Final   Report Status 10/14/2015 FINAL  Final     Studies: No results found.  Scheduled Meds: . cefUROXime  250 mg Oral Q12H  . famotidine  20 mg Oral BID  . feeding supplement (ENSURE ENLIVE)  237 mL Oral TID BM  . free water  200 mL Oral 3 times per day  . lacosamide  100 mg Oral BID  . levETIRAcetam  1,500 mg Oral BID  . lisinopril  20 mg Oral Daily  . metoprolol tartrate  50 mg Oral BID  . multivitamin with minerals  1 tablet Oral Daily  . potassium chloride  10 mEq Intravenous Q1 Hr x 4   Continuous Infusions: . dextrose 50 mL/hr at 10/18/15 0800    Active Problems:   Pressure ulcer   Sepsis (HCC)   UTI (lower urinary tract infection)   Acute renal failure (ARF) (HCC)   Septic shock (HCC)    Time spent: 25 min    Jeralyn Bennett  [facility] Pager [phone]. If 7PM-7AM, please contact night-coverage at [email or web address], password [facility] 10/18/2015, 5:17 PM  LOS: 6 days

## 2015-10-18 NOTE — Consult Note (Addendum)
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 Septic on presentation and admitted to the pulmonary critical care service. She required IV pressors support. She showed clinical improvement and was transferred to the medicine service on 10/15/2015. Hospitalization complicated by failure to thrive, having minimal by mouth intake. Palliative care was consulted to determine medical goals of care.  I met today with the patient's son, Torrie.  He reports that the most important thing to his mother has always been her family.   He reports that the doctors have been doing a good job explaining things to him and he understands the severity of his mother's condition.  We had a long discussion about making decisions moving forward in her care that are likely to allow her to be outside the hospital and spend time with family while also considering the possibility of limiting care that is not likely to result in her becoming well to be outside of the hospital.  He reports understanding that her condition and necessity to begin these conversations, but he also needs to include his family in conversations.  Contacts/Participants in Discussion: Primary Decision Maker: Torrie Rueth   Relationship to Patient son HCPOA: Yes, but no copy on chart  SUMMARY OF RECOMMENDATIONS - Family would like to pursue  all measures that may add time to her life.   - Family would like to pursue PEG tube for nutrition and hydration.  We discussed risk versus benefit and that this intervention alone will not result in her getting back to her former baseline.  We also discussed the possibility of a time limited trial of feeding tube.  Her son seemed to think that this may be the best plan moving forward. - We reviewed a MOST form and discussed how to develop plan of care to focus on continuing therapies that would maximize chance of being well enough to return home and limiting therapies not in line with this goal.  He will talk to his family about this.  We did not complete a MOST today.  - I provided him with a copy of [employer] Choices for [employer]oving People to review.  Code Status/Advance Care Planning: Full code    Code Status Orders        Start     Ordered   10/12/15 1928  Full code   Continuous     10/12/15 1929      Other Directives:Advanced Directive  Symptom Management:   Patient nonverbal. No active symptoms.  Palliative Prophylaxis:   Aspiration, Bowel Regimen and Delirium Protocol  Psycho-social/Spiritual:  Support System: Will Desire for further Chaplaincy support:No  Prognosis: Unable to determine due to acute illness  Discharge Planning: [facility] for rehab with Palliative care service follow-up   Chief Complaint/ Primary Diagnoses: Present on Admission:  . Sepsis ([place]) . UTI (lower urinary tract infection) . Pressure ulcer  I have reviewed the medical record, interviewed the patient and family, and examined the patient. The following aspects are pertinent.  Past Medical  History  Diagnosis Date  . Hypertension   . SAH (subarachnoid hemorrhage) ([place])   . Seizure ([place])   . Stroke ([place])   . Tracheostomy status ([place])   . Tracheomalacia   . DVT (deep venous thrombosis) ([place])    Social History   Social History  . Marital Status: Single    Spouse Name: N/A  .  Number of Children: N/A  . Years of Education: N/A   Social History Main Topics  . Smoking status: Former Smoker    Types: Cigarettes  . Smokeless tobacco: Never Used     Comment: trying to quit-2 cigarettes daily  . Alcohol Use: No     Comment: Very seldom  . Drug Use: No  . Sexual Activity: No   Other Topics Concern  . None   Social History Narrative   Family History  Problem Relation Age of Onset  . Heart disease Mother 49    MI   Scheduled Meds: . cefUROXime  250 mg Oral Q12H  . famotidine  20 mg Oral BID  . feeding supplement (ENSURE ENLIVE)  237 mL Oral TID BM  . free water  200 mL Oral 3 times per day  . lacosamide  100 mg Oral BID  . levETIRAcetam  1,500 mg Oral BID  . lisinopril  20 mg Oral Daily  . metoprolol tartrate  50 mg Oral BID  . multivitamin with minerals  1 tablet Oral Daily   Continuous Infusions: . dextrose 50 mL/hr at 10/18/15 0800   PRN Meds:.acetaminophen, hydrALAZINE, ondansetron (ZOFRAN) IV, sodium chloride Medications Prior to Admission:  Prior to Admission medications   Medication Sig Start Date End Date Taking? Authorizing Provider  acetaminophen (TYLENOL) 325 MG tablet Take 2 tablets (650 mg total) by mouth daily. Patient taking differently: Take 650 mg by mouth daily as needed for moderate pain or fever. 2 tablets given every day, may take additional tablets PRN for fever above 100 F 03/22/15  Yes Annita Brod, MD  Amino Acids-Protein Hydrolys (FEEDING SUPPLEMENT, PRO-STAT SUGAR FREE 64,) LIQD Take 30 mLs by mouth daily.   Yes Historical Provider, MD  Ascorbic Acid (VITAMIN C) 1000 MG tablet Take 1,000 mg by mouth daily.   Yes Historical Provider, MD  carboxymethylcellulose (REFRESH PLUS) 0.5 % SOLN Place 2 drops into both eyes 3 (three) times daily.   Yes Historical Provider, MD  Cholecalciferol (VITAMIN D3) 5000 UNITS TABS Take 5,000 Units by mouth daily with breakfast.    Yes Historical Provider, MD  cholestyramine light (PREVALITE)  4 G packet Take 4 g by mouth 3 (three) times daily. mix in 8 oz. Of Liquid   Yes Historical Provider, MD  cloNIDine (CATAPRES - DOSED IN MG/24 HR) 0.3 mg/24hr patch Place 1 patch (0.3 mg total) onto the skin once a week. 06/06/15  Yes Janece Canterbury, MD  famotidine (PEPCID) 40 MG/5ML suspension Take 2.5 mLs (20 mg total) by mouth every 12 (twelve) hours. 03/22/15  Yes Annita Brod, MD  feeding supplement, ENSURE ENLIVE, (ENSURE ENLIVE) LIQD Take 237 mLs by mouth 2 (two) times daily between meals. Patient taking differently: Take 237 mLs by mouth 3 (three) times daily between meals.  06/06/15  Yes Janece Canterbury, MD  ferrous sulfate 300 (60 FE) MG/5ML syrup Take 5 mLs (300 mg total) by mouth 2 (two) times daily with a meal. 09/06/15  Yes Hosie Poisson, MD  Lacosamide (VIMPAT) 100 MG TABS Take 100 mg by mouth 2 (two) times daily.  Yes Historical Provider, MD  levETIRAcetam (KEPPRA) 750 MG tablet Take 1,500 mg by mouth 2 (two) times daily.   Yes Historical Provider, MD  lisinopril (PRINIVIL,ZESTRIL) 10 MG tablet Take 10 mg by mouth daily.   Yes Historical Provider, MD  loperamide (IMODIUM A-D) 2 MG tablet Take 4 mg by mouth 4 (four) times daily as needed for diarrhea or loose stools.   Yes Historical Provider, MD  metoprolol (LOPRESSOR) 100 MG tablet Take 1 tablet (100 mg total) by mouth 2 (two) times daily. 03/22/15  Yes Annita Brod, MD  mirtazapine (REMERON) 7.5 MG tablet Take 7.5 mg by mouth at bedtime.   Yes Historical Provider, MD  Multiple Vitamin (MULTIVITAMIN WITH MINERALS) TABS tablet Take 1 tablet by mouth daily with breakfast.   Yes Historical Provider, MD  traZODone (DESYREL) 50 MG tablet Take 25 mg by mouth at bedtime as needed for sleep.    Yes Historical Provider, MD  collagenase (SANTYL) ointment Apply topically 2 (two) times daily. Patient not taking: Reported on 10/12/2015 09/06/15   Hosie Poisson, MD  lacosamide (VIMPAT) 50 MG TABS tablet Take 2 tablets (100 mg total) by mouth 2  (two) times daily. Patient not taking: Reported on 10/12/2015 06/06/15   Janece Canterbury, MD  potassium chloride SA (K-DUR,KLOR-CON) 20 MEQ tablet Take 2 tablets (40 mEq total) by mouth 2 (two) times daily. Patient not taking: Reported on 10/12/2015 09/06/15   Hosie Poisson, MD   Allergies  Allergen Reactions  . Amlodipine Swelling  . Penicillins Swelling    Has patient had a PCN reaction causing immediate rash, facial/tongue/throat swelling, SOB or lightheadedness with hypotension: Yes Has tolerated cephalosporins in the past Has patient had a PCN reaction causing severe rash involving mucus membranes or skin necrosis: No Has patient had a PCN reaction that required hospitalization No Has patient had a PCN reaction occurring within the last 10 years: No If all of the above answers are "NO", then may proceed with Cephalosporin use.  . Latex Rash    Unknown   . 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 No evidence of active infection. Vital Signs: BP 155/102 mmHg  Pulse 102  Temp(Src) 97.8 F (36.6 C) (Oral)  Resp 18  Ht 5' 3"  (1.6 m)  Wt 80.8 kg (178 lb 2.1 oz)  BMI 31.56 kg/m2  SpO2 100%  SpO2: SpO2: 100 % O2 Device:SpO2: 100 % O2 Flow Rate: .   IO: Intake/output summary:  Intake/Output Summary (Last 24 hours) at 10/18/15 2314 Last data filed at 10/18/15 1322  Gross per 24 hour  Intake      0 ml  Output    200 ml  Net   -200 ml    LBM: Last BM Date: 10/17/15 Baseline Weight: Weight: 79.833 kg (176 lb) Most recent weight: Weight: 80.8 kg (178 lb 2.1 oz)      Palliative Assessment/Data:  Flowsheet Rows         Most Recent Value   Intake Tab    Referral Department  Hospitalist   Unit at Time of Referral  Med/Surg Unit   Palliative Care Primary Diagnosis  Sepsis/Infectious Disease   Date Notified  10/17/15   Palliative Care Type  Return patient Palliative Care   Reason for referral  Clarify Goals of Care   Date of Admission  10/12/15   Date first seen by Palliative Care  10/18/15   # of days Palliative referral response time  1 Day(s)   # of days IP prior to Palliative referral  5   Clinical Assessment    Psychosocial & Spiritual Assessment    Palliative Care Outcomes       Additional Data Reviewed:  CBC:    Component Value Date/Time   WBC 7.4 10/18/2015 1400   WBC 8.9 08/11/2013 1521   HGB 8.9* 10/18/2015 1400   HGB 13.8 08/11/2013 1521   HCT 27.8* 10/18/2015 1400   HCT 41.5 08/11/2013 1521   PLT 192 10/18/2015 1400   PLT 189 08/11/2013 1521   MCV 88.0 10/18/2015 1400   MCV 88 08/11/2013 1521   NEUTROABS 4.7 10/15/2015 0347   NEUTROABS 4.5 02/13/2013 0400   LYMPHSABS 4.5* 10/15/2015 0347   LYMPHSABS 2.2 02/13/2013 0400   MONOABS 0.5 10/15/2015 0347   MONOABS 0.9 02/13/2013 0400   EOSABS 0.0 10/15/2015 0347   EOSABS 0.1 02/13/2013 0400   BASOSABS 0.0 10/15/2015 0347   BASOSABS 0.1 02/13/2013 0400   Comprehensive Metabolic Panel:    Component Value Date/Time   NA 154* 10/18/2015 1400   NA 137 08/11/2013 1521   K 3.2* 10/18/2015 1400   K 3.4* 08/11/2013 1521   CL >130* 10/18/2015 1400   CL 102 08/11/2013 1521   CO2 18* 10/18/2015 1400   CO2 27 08/11/2013 1521   BUN 20 10/18/2015 1400   BUN 15 08/11/2013 1521   CREATININE 0.70 10/18/2015 1400   CREATININE 0.99 08/11/2013 1521   GLUCOSE 114* 10/18/2015 1400   GLUCOSE 92 08/11/2013 1521   CALCIUM 8.8* 10/18/2015 1400   CALCIUM 8.7 08/11/2013 1521   AST 16 10/12/2015 2145   AST 24 08/11/2013 1521   ALT 19 10/12/2015 2145   ALT 31 08/11/2013 1521   ALKPHOS 69 10/12/2015 2145   ALKPHOS 104 08/11/2013 1521    BILITOT 0.3 10/12/2015 2145   BILITOT 0.3 08/11/2013 1521   PROT 5.7* 10/12/2015 2145   PROT 8.0 08/11/2013 1521   ALBUMIN 2.0* 10/12/2015 2145   ALBUMIN 3.9 08/11/2013 1521     Time In: 1515 Time Out: 1630 Time Total: 75 Greater than 50%  of this time was spent counseling and coordinating care related to the above assessment and plan.  Signed by: Micheline Rough, MD  Micheline Rough, MD  10/18/2015, 11:14 PM  Please contact Palliative Medicine Team phone at [phone] for questions and concerns.

## 2015-10-19 ENCOUNTER — Inpatient Hospital Stay (HOSPITAL_COMMUNITY): Payer: 59

## 2015-10-19 LAB — CBC
HEMATOCRIT: 25.6 % — AB (ref 36.0–46.0)
HEMOGLOBIN: 8.3 g/dL — AB (ref 12.0–15.0)
MCH: 28.5 pg (ref 26.0–34.0)
MCHC: 32.4 g/dL (ref 30.0–36.0)
MCV: 88 fL (ref 78.0–100.0)
Platelets: 152 10*3/uL (ref 150–400)
RBC: 2.91 MIL/uL — AB (ref 3.87–5.11)
RDW: 20.8 % — AB (ref 11.5–15.5)
WBC: 6.4 10*3/uL (ref 4.0–10.5)

## 2015-10-19 LAB — C-REACTIVE PROTEIN: CRP: 1.6 mg/dL — AB (ref <1.0)

## 2015-10-19 LAB — APTT: aPTT: 26 seconds (ref 24–37)

## 2015-10-19 LAB — OCCULT BLOOD X 1 CARD TO LAB, STOOL: FECAL OCCULT BLD: NEGATIVE

## 2015-10-19 LAB — BASIC METABOLIC PANEL
ANION GAP: 6 (ref 5–15)
BUN: 16 mg/dL (ref 6–20)
CHLORIDE: 126 mmol/L — AB (ref 101–111)
CO2: 19 mmol/L — ABNORMAL LOW (ref 22–32)
Calcium: 8.4 mg/dL — ABNORMAL LOW (ref 8.9–10.3)
Creatinine, Ser: 0.63 mg/dL (ref 0.44–1.00)
Glucose, Bld: 99 mg/dL (ref 65–99)
POTASSIUM: 3.2 mmol/L — AB (ref 3.5–5.1)
SODIUM: 151 mmol/L — AB (ref 135–145)

## 2015-10-19 LAB — PROTIME-INR
INR: 1.17 (ref 0.00–1.49)
PROTHROMBIN TIME: 15.1 s (ref 11.6–15.2)

## 2015-10-19 LAB — SEDIMENTATION RATE: Sed Rate: 38 mm/hr — ABNORMAL HIGH (ref 0–22)

## 2015-10-19 MED ORDER — HYDRALAZINE HCL 20 MG/ML IJ SOLN
5.0000 mg | Freq: Once | INTRAMUSCULAR | Status: AC
  Administered 2015-10-20: 5 mg via INTRAVENOUS
  Filled 2015-10-19: qty 1

## 2015-10-19 MED ORDER — POTASSIUM CHLORIDE 10 MEQ/50ML IV SOLN
10.0000 meq | INTRAVENOUS | Status: AC
  Administered 2015-10-19 (×3): 10 meq via INTRAVENOUS
  Filled 2015-10-19 (×3): qty 50

## 2015-10-19 NOTE — Progress Notes (Signed)
Patient's  BP 160/106 on the right arm and 154/113 on the left arm. Lopressor 50 mg given at 2150 and on-call floor coverage, [place] notified.  Awaiting orders and will continue to monitor.

## 2015-10-19 NOTE — Progress Notes (Signed)
Nutrition Follow-up  DOCUMENTATION CODES:   Obesity unspecified  INTERVENTION:   -D/c Ensure Enlive, due to poor acceptance -Continue MVI daily -If family continues to desire PEG placement for nutrition support, recommend:  Initiate Jevity 1.5 @ 20 ml/hr via PEH and increase by 10 ml every 12 hours to goal rate of 40 ml/hr.   60 ml Prostat BID.    Tube feeding regimen provides 1840 kcal (100% of needs), 121 grams of protein, and 730 ml of H2O.   NUTRITION DIAGNOSIS:   Increased nutrient needs related to wound healing as evidenced by estimated needs.  Ongoing  GOAL:   Patient will meet greater than or equal to 90% of their needs  Unmet  MONITOR:   PO intake, Diet advancement, Supplement acceptance, Labs, Weight trends, Skin  REASON FOR ASSESSMENT:   Low Braden    ASSESSMENT:   49 y.o. F who is paraplegic following a ruptured cerebral aneurysm in 2014. Since then, she has lived in SNF [facility]multiple [facility] since then[facility] She is also non-verbal at baseline.She had apparently been in her USOH until 12/7 when she was less arouseable then usual. She was profoundly hypotensive and she was found to have multiple metabolic derangements for which [facility].  Pt transferred from ICU to medical floor on 10/14/15. Per MD notes, septic shock resolved on 10/14/15.   Pt has been advanced to a dysphagia 1 diet. However, intake has been very minimal (PO: 0-25%). She has been refusing Ensure Enlive supplements; staff reports pt purses her lips shut when offered.   Palliative care team following. Family meeting held on 10/18/15. Pt family would like PEG for additional nutrition support. IR has been consulted for placement. RD will provide TF recommendations.   Labs reviewed: Na: 151, K: 3.2.   Diet Order:  DIET - DYS 1 Room service appropriate?: Yes; Fluid consistency:: Thin Diet NPO time specified  Except for: Sips with Meds  Skin:  Wound (see comment) (stg 1 R hip; stg IV sacrum; DTI R & L foot)  Last BM:  10/17/15  Height:   Ht Readings from Last 1 Encounters:  10/12/15 5\' 3"  (1.6 m)    Weight:   Wt Readings from Last 1 Encounters:  10/19/15 182 lb (82.555 kg)    Ideal Body Weight:  52.3 kg  BMI:  Body mass index is 32.25 kg/(m^2).  Estimated Nutritional Needs:   Kcal:  1700-1900  Protein:  115-130 gm  Fluid:  1.8-2 L  EDUCATION NEEDS:   No education needs identified at this time  Manette Doto A. Mayford KnifeWilliams, RD, LDN, CDE Pager: [phone] After hours Pager: [phone]

## 2015-10-19 NOTE — Consult Note (Signed)
[place] for Infectious Disease  Date of Admission:  10/12/2015  Date of Consult:  10/19/2015  Reason for Consult: Sepsis Referring Physician: [name]  Impression/Recommendation Sepsis Resolved.   Decubitus ulcers Air flow bed Nutrition eval Will get PEG soon, family aware this will not change her overall outlook Her alb is 2.  Will examine her wounds with RN in AM to better describe.  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 female.  HPI: 49 yo F who is paraplegic post ruptured cerebral aneurysm 2014.  She was adm 08-28-15 to 09-06-15 with E coli sepsis from urinary source.  She was eval by surgery for her decubitus- she had local debridement/bedside at that time, she was treated with vanco/ceftaz in hospital. She was d/c home with 4 days of keflex.  She came to ED on 12-7 with mental status change from SNF (she is non-verbal). She was hypotensive and required pressors. She also had ARF (Cr 3.15 and Na 163). She was started on vanco/aztreonam/levaquin/diflucan. Presumed to have UTI due to pyuria.  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   She was transferred out of ICU on 12-9.  Her Cx were notable for 1/2 BCx CNS, UCx (-). She also had a swab of her sacral wound which was MRSA.  She was changed to ceftin on 12-11.  She was eval by palliative care on 12-13 regarding feeding tube and goals of care.   Past Medical History  Diagnosis Date  . Hypertension   . SAH (subarachnoid hemorrhage) ([place])   . Seizure ([place])   . Stroke ([place])   . Tracheostomy status ([place])   . Tracheomalacia   . DVT (deep  venous thrombosis) [facility])     Past Surgical History  Procedure Laterality Date  . Abdominal hysterectomy    . Cholecystectomy       Allergies  Allergen Reactions  . Amlodipine Swelling  . Penicillins Swelling    Has patient had a PCN reaction causing immediate rash, facial/tongue/throat swelling, SOB or lightheadedness with hypotension: Yes Has tolerated cephalosporins in the past Has patient had a PCN reaction causing severe rash involving mucus membranes or skin necrosis: No Has patient had a PCN reaction that required hospitalization No Has patient had a PCN reaction occurring within the last 10 years: No If all of the above answers are "NO", then may proceed with Cephalosporin use.  . Latex Rash    Unknown   . Other Other (See Comments)    Natural Rubber- Unknown     Medications:  Scheduled: . cefUROXime  250 mg Oral Q12H  . famotidine  20 mg Oral BID  . feeding supplement (ENSURE ENLIVE)  237 mL Oral TID BM  . free water  200 mL Oral 3 times per day  . lacosamide  100 mg Oral BID  . levETIRAcetam  1,500 mg Oral BID  . lisinopril  20 mg Oral Daily  . metoprolol tartrate  50 mg Oral BID  . multivitamin with minerals  1 tablet Oral Daily  . potassium chloride  10  mEq Intravenous Q1 Hr x 3    Abtx:  Anti-infectives    Start     Dose/Rate Route Frequency Ordered Stop   10/16/15 1800  vancomycin (VANCOCIN) IVPB 1000 mg/200 mL premix  Status:  Discontinued     1,000 mg 200 mL/hr over 60 Minutes Intravenous Every 12 hours 10/16/15 1340 10/16/15 1435   10/16/15 1530  cefUROXime (CEFTIN) 250 MG/5ML suspension 250 mg     250 mg Oral Every 12 hours 10/16/15 1437     10/14/15 1700  levofloxacin (LEVAQUIN) IVPB 500 mg  Status:  Discontinued     500 mg 100 mL/hr over 60 Minutes Intravenous Every 48 hours 10/12/15 2125 10/14/15 1136   10/14/15 1200  cefTAZidime (FORTAZ) 2 g in dextrose 5 % 50 mL IVPB  Status:  Discontinued     2 g 100 mL/hr over 30 Minutes Intravenous Every 12  hours 10/14/15 1144 10/16/15 1435   10/14/15 1145  aztreonam (AZACTAM) 1 g in dextrose 5 % 50 mL IVPB  Status:  Discontinued     1 g 100 mL/hr over 30 Minutes Intravenous 3 times per day 10/14/15 1135 10/14/15 1144   10/13/15 1800  vancomycin (VANCOCIN) IVPB 1000 mg/200 mL premix  Status:  Discontinued     1,000 mg 200 mL/hr over 60 Minutes Intravenous Every 24 hours 10/13/15 1019 10/16/15 1340   10/13/15 0330  aztreonam (AZACTAM) 1 g in dextrose 5 % 50 mL IVPB  Status:  Discontinued     1 g 100 mL/hr over 30 Minutes Intravenous Every 8 hours 10/12/15 1959 10/12/15 2111   10/12/15 2130  fluconazole (DIFLUCAN) IVPB 200 mg  Status:  Discontinued     200 mg 100 mL/hr over 60 Minutes Intravenous Every 24 hours 10/12/15 2102 10/14/15 1201   10/12/15 2115  aztreonam (AZACTAM) 2 g in dextrose 5 % 50 mL IVPB  Status:  Discontinued     2 g 100 mL/hr over 30 Minutes Intravenous  Once 10/12/15 2111 10/12/15 2123   10/12/15 2000  vancomycin (VANCOCIN) 500 mg in sodium chloride 0.9 % 100 mL IVPB     500 mg 100 mL/hr over 60 Minutes Intravenous  Once 10/12/15 1957 10/12/15 2244   10/12/15 1715  levofloxacin (LEVAQUIN) IVPB 750 mg     750 mg 100 mL/hr over 90 Minutes Intravenous  Once 10/12/15 1713 10/12/15 1906   10/12/15 1715  aztreonam (AZACTAM) 2 g in dextrose 5 % 50 mL IVPB     2 g 100 mL/hr over 30 Minutes Intravenous  Once 10/12/15 1713 10/12/15 2008   10/12/15 1715  vancomycin (VANCOCIN) IVPB 1000 mg/200 mL premix     1,000 mg 200 mL/hr over 60 Minutes Intravenous  Once 10/12/15 1713 10/12/15 1906      Total days of antibiotics: 6 vanoc/aztreonam --> vanco/ceftaz --> ceftin (12-11)          Social History:  reports that she has quit smoking. Her smoking use included Cigarettes. She has never used smokeless tobacco. She reports that she does not drink alcohol or use illicit drugs.  Family History  Problem Relation Age of Onset  . Heart disease Mother 31    MI    General ROS: pt  non-verbal  Blood pressure 131/92, pulse 103, temperature 98.6 F (37 C), temperature source Oral, resp. rate 18, height 5' 3"  (1.6 m), weight 82.555 kg (182 lb), SpO2 99 %. General appearance: awake, does not participate in exam.  Eyes: negative findings: conjunctivae and sclerae  normal and pupils equal, round, reactive to light and accomodation Throat: does not open mouth Neck: flexed to R Lungs: clear to auscultation bilaterally Heart: regular rate and rhythm Abdomen: normal findings: bowel sounds normal and soft, non-tender Extremities: edema 1-2+. there are no ulcers on her legs.    Results for orders placed or performed during the hospital encounter of 10/12/15 (from the past 48 hour(s))  Basic metabolic panel     Status: Abnormal   Collection Time: 10/18/15  2:00 PM  Result Value Ref Range   Sodium 154 (H) 135 - 145 mmol/L   Potassium 3.2 (L) 3.5 - 5.1 mmol/L   Chloride >130 (HH) 101 - 111 mmol/L    Comment: CRITICAL RESULT CALLED TO, READ BACK BY AND VERIFIED WITH: AWynetta Emery RN [phone] 1522 GREEN R    CO2 18 (L) 22 - 32 mmol/L   Glucose, Bld 114 (H) 65 - 99 mg/dL   BUN 20 6 - 20 mg/dL   Creatinine, Ser 0.70 0.44 - 1.00 mg/dL   Calcium 8.8 (L) 8.9 - 10.3 mg/dL   GFR calc non Af Amer >60 >60 mL/min   GFR calc Af Amer >60 >60 mL/min    Comment: (NOTE) The eGFR has been calculated using the CKD EPI equation. This calculation has not been validated in all clinical situations. eGFR's persistently <60 mL/min signify possible Chronic Kidney Disease.    Anion gap NOT CALCULATED 5 - 15  CBC     Status: Abnormal   Collection Time: 10/18/15  2:00 PM  Result Value Ref Range   WBC 7.4 4.0 - 10.5 K/uL   RBC 3.16 (L) 3.87 - 5.11 MIL/uL   Hemoglobin 8.9 (L) 12.0 - 15.0 g/dL   HCT 27.8 (L) 36.0 - 46.0 %   MCV 88.0 78.0 - 100.0 fL   MCH 28.2 26.0 - 34.0 pg   MCHC 32.0 30.0 - 36.0 g/dL   RDW 20.1 (H) 11.5 - 15.5 %   Platelets 192 150 - 400 K/uL  Basic metabolic panel     Status:  Abnormal   Collection Time: 10/19/15  4:05 AM  Result Value Ref Range   Sodium 151 (H) 135 - 145 mmol/L   Potassium 3.2 (L) 3.5 - 5.1 mmol/L   Chloride 126 (H) 101 - 111 mmol/L   CO2 19 (L) 22 - 32 mmol/L   Glucose, Bld 99 65 - 99 mg/dL   BUN 16 6 - 20 mg/dL   Creatinine, Ser 0.63 0.44 - 1.00 mg/dL   Calcium 8.4 (L) 8.9 - 10.3 mg/dL   GFR calc non Af Amer >60 >60 mL/min   GFR calc Af Amer >60 >60 mL/min    Comment: (NOTE) The eGFR has been calculated using the CKD EPI equation. This calculation has not been validated in all clinical situations. eGFR's persistently <60 mL/min signify possible Chronic Kidney Disease.    Anion gap 6 5 - 15  CBC     Status: Abnormal   Collection Time: 10/19/15  4:05 AM  Result Value Ref Range   WBC 6.4 4.0 - 10.5 K/uL   RBC 2.91 (L) 3.87 - 5.11 MIL/uL   Hemoglobin 8.3 (L) 12.0 - 15.0 g/dL   HCT 25.6 (L) 36.0 - 46.0 %   MCV 88.0 78.0 - 100.0 fL   MCH 28.5 26.0 - 34.0 pg   MCHC 32.4 30.0 - 36.0 g/dL   RDW 20.8 (H) 11.5 - 15.5 %   Platelets 152 150 - 400 K/uL  Sedimentation rate     Status: Abnormal   Collection Time: 10/19/15 12:05 PM  Result Value Ref Range   Sed Rate 38 (H) 0 - 22 mm/hr  C-reactive protein     Status: Abnormal   Collection Time: 10/19/15 12:05 PM  Result Value Ref Range   CRP 1.6 (H) <1.0 mg/dL  Protime-INR     Status: None   Collection Time: 10/19/15 12:15 PM  Result Value Ref Range   Prothrombin Time 15.1 11.6 - 15.2 seconds   INR 1.17 0.00 - 1.49  APTT     Status: None   Collection Time: 10/19/15 12:15 PM  Result Value Ref Range   aPTT 26 24 - 37 seconds  Occult blood card to lab, stool RN will collect     Status: None   Collection Time: 10/19/15  1:58 PM  Result Value Ref Range   Fecal Occult Bld NEGATIVE NEGATIVE      Component Value Date/Time   SDES URINE, CATHETERIZED 10/13/2015 1801   SPECREQUEST NONE 10/13/2015 1801   CULT 8,000 COLONIES/mL INSIGNIFICANT GROWTH 10/13/2015 1801   REPTSTATUS 10/14/2015  FINAL 10/13/2015 1801   No results found. Recent Results (from the past 240 hour(s))  Blood culture (routine x 2)     Status: None   Collection Time: 10/12/15  4:00 PM  Result Value Ref Range Status   Specimen Description BLOOD RIGHT ARM  Final   Special Requests BOTTLES DRAWN AEROBIC AND ANAEROBIC 5CC  Final   Culture  Setup Time   Final    GRAM POSITIVE COCCI IN CLUSTERS ANAEROBIC BOTTLE ONLY CRITICAL RESULT CALLED TO, READ BACK BY AND VERIFIED WITH: Cipriano Mile RN 2006 10/13/15 A BROWNING    Culture   Final    STAPHYLOCOCCUS SPECIES (COAGULASE NEGATIVE) THE SIGNIFICANCE OF ISOLATING THIS ORGANISM FROM A SINGLE SET OF BLOOD CULTURES WHEN MULTIPLE SETS ARE DRAWN IS UNCERTAIN. PLEASE NOTIFY THE MICROBIOLOGY DEPARTMENT WITHIN ONE WEEK IF SPECIATION AND SENSITIVITIES ARE REQUIRED.    Report Status 10/14/2015 FINAL  Final  Urine culture     Status: None   Collection Time: 10/12/15  4:55 PM  Result Value Ref Range Status   Specimen Description URINE, RANDOM  Final   Special Requests NONE  Final   Culture MULTIPLE SPECIES PRESENT, SUGGEST RECOLLECTION  Final   Report Status 10/13/2015 FINAL  Final  MRSA PCR Screening     Status: None   Collection Time: 10/12/15  9:49 PM  Result Value Ref Range Status   MRSA by PCR NEGATIVE NEGATIVE Final    Comment:        The GeneXpert MRSA Assay (FDA approved for NASAL specimens only), is one component of a comprehensive MRSA colonization surveillance program. It is not intended to diagnose MRSA infection nor to guide or monitor treatment for MRSA infections.   Blood culture (routine x 2)     Status: None   Collection Time: 10/12/15 10:12 PM  Result Value Ref Range Status   Specimen Description BLOOD CENTRAL LINE  Final   Special Requests BOTTLES DRAWN AEROBIC AND ANAEROBIC 10CC  Final   Culture NO GROWTH 5 DAYS  Final   Report Status 10/17/2015 FINAL  Final  Wound culture     Status: None   Collection Time: 10/13/15  5:55 AM  Result Value  Ref Range Status   Specimen Description WOUND  Final   Special Requests WOUND SACRUM  Final   Gram Stain   Final    FEW WBC  PRESENT,BOTH PMN AND MONONUCLEAR NO SQUAMOUS EPITHELIAL CELLS SEEN NO ORGANISMS SEEN Performed at [facility]    Culture   Final    MODERATE METHICILLIN RESISTANT STAPHYLOCOCCUS AUREUS Note: RIFAMPIN AND GENTAMICIN SHOULD NOT BE USED AS SINGLE DRUGS FOR TREATMENT OF STAPH INFECTIONS. CRITICAL RESULT CALLED TO, READ BACK BY AND VERIFIED WITH: KELLY DUFFY 10/17/15 1012 BY SMITHERSJ Performed at [facility]    Report Status 10/17/2015 FINAL  Final   Organism ID, Bacteria METHICILLIN RESISTANT STAPHYLOCOCCUS AUREUS  Final      Susceptibility   Methicillin resistant staphylococcus aureus - MIC*    CLINDAMYCIN >=8 RESISTANT Resistant     ERYTHROMYCIN >=8 RESISTANT Resistant     GENTAMICIN <=0.5 SENSITIVE Sensitive     LEVOFLOXACIN >=8 RESISTANT Resistant     OXACILLIN >=4 RESISTANT Resistant     RIFAMPIN <=0.5 SENSITIVE Sensitive     TRIMETH/SULFA <=10 SENSITIVE Sensitive     VANCOMYCIN 1 SENSITIVE Sensitive     TETRACYCLINE <=1 SENSITIVE Sensitive     * MODERATE METHICILLIN RESISTANT STAPHYLOCOCCUS AUREUS  Urine culture     Status: None   Collection Time: 10/13/15  6:01 PM  Result Value Ref Range Status   Specimen Description URINE, CATHETERIZED  Final   Special Requests NONE  Final   Culture 8,000 COLONIES/mL INSIGNIFICANT GROWTH  Final   Report Status 10/14/2015 FINAL  Final      10/19/2015, 4:33 PM     LOS: 7 days    Records and images were personally reviewed where available.

## 2015-10-19 NOTE — Consult Note (Signed)
Chief Complaint: Patient was seen in consultation today for percutaneous gastric tube placement Chief Complaint  Patient presents with  . Abnormal Lab  . Hypotension   at the request of Dr Elvera Lennox  Referring Physician(s): TRH  History of Present Illness: [name] is a 49 y.o. female   Pt suffered rupture of cerebral aneurysm 2014 Quadriplegia Unresponsive; non verbal  IVC filter placed 08/2105 seconday +DVT; but anticoagulation contraindicated +sacral decubiti Slow healing Failure to thrive Deconditioning +BC 12/7 (staph coag neg---prob contaminate) Palliative consult in chart Request for percutaneous gastric tube placement for hydration and nutritional support Dr Deanne Coffer has reviewed imaging and approves procedure  Past Medical History  Diagnosis Date  . Hypertension   . SAH (subarachnoid hemorrhage) ([facility])   . Seizure ([facility])   . Stroke ([facility])   . Tracheostomy status ([facility])   . Tracheomalacia   . DVT (deep venous thrombosis) [facility])     Past Surgical History  Procedure Laterality Date  . Abdominal hysterectomy    . Cholecystectomy      Allergies: Amlodipine; Penicillins; Latex; and Other  Medications: Prior to Admission medications   Medication Sig Start Date End Date Taking? Authorizing Provider  acetaminophen (TYLENOL) 325 MG tablet Take 2 tablets (650 mg total) by mouth daily. Patient taking differently: Take 650 mg by mouth daily as needed for moderate pain or fever. 2 tablets given every day, may take additional tablets PRN for fever above 100 F 03/22/15  Yes Hollice Espy, MD  Amino Acids-Protein Hydrolys (FEEDING SUPPLEMENT, PRO-STAT SUGAR FREE 64,) LIQD Take 30 mLs by mouth daily.   Yes Historical Provider, MD  Ascorbic Acid (VITAMIN C) 1000 MG tablet Take 1,000 mg by mouth daily.   Yes Historical Provider, MD  carboxymethylcellulose (REFRESH PLUS) 0.5 % SOLN Place 2 drops into both eyes 3 (three) times daily.   Yes Historical Provider, MD    Cholecalciferol (VITAMIN D3) 5000 UNITS TABS Take 5,000 Units by mouth daily with breakfast.    Yes Historical Provider, MD  cholestyramine light (PREVALITE) 4 G packet Take 4 g by mouth 3 (three) times daily. mix in 8 oz. Of Liquid   Yes Historical Provider, MD  cloNIDine (CATAPRES - DOSED IN MG/24 HR) 0.3 mg/24hr patch Place 1 patch (0.3 mg total) onto the skin once a week. 06/06/15  Yes Renae Fickle, MD  famotidine (PEPCID) 40 MG/5ML suspension Take 2.5 mLs (20 mg total) by mouth every 12 (twelve) hours. 03/22/15  Yes Hollice Espy, MD  feeding supplement, ENSURE ENLIVE, (ENSURE ENLIVE) LIQD Take 237 mLs by mouth 2 (two) times daily between meals. Patient taking differently: Take 237 mLs by mouth 3 (three) times daily between meals.  06/06/15  Yes Renae Fickle, MD  ferrous sulfate 300 (60 FE) MG/5ML syrup Take 5 mLs (300 mg total) by mouth 2 (two) times daily with a meal. 09/06/15  Yes Kathlen Mody, MD  Lacosamide (VIMPAT) 100 MG TABS Take 100 mg by mouth 2 (two) times daily.   Yes Historical Provider, MD  levETIRAcetam (KEPPRA) 750 MG tablet Take 1,500 mg by mouth 2 (two) times daily.   Yes Historical Provider, MD  lisinopril (PRINIVIL,ZESTRIL) 10 MG tablet Take 10 mg by mouth daily.   Yes Historical Provider, MD  loperamide (IMODIUM A-D) 2 MG tablet Take 4 mg by mouth 4 (four) times daily as needed for diarrhea or loose stools.   Yes Historical Provider, MD  metoprolol (LOPRESSOR) 100 MG tablet Take 1 tablet (100 mg  total) by mouth 2 (two) times daily. 03/22/15  Yes Hollice EspySendil K Krishnan, MD  mirtazapine (REMERON) 7.5 MG tablet Take 7.5 mg by mouth at bedtime.   Yes Historical Provider, MD  Multiple Vitamin (MULTIVITAMIN WITH MINERALS) TABS tablet Take 1 tablet by mouth daily with breakfast.   Yes Historical Provider, MD  traZODone (DESYREL) 50 MG tablet Take 25 mg by mouth at bedtime as needed for sleep.    Yes Historical Provider, MD  collagenase (SANTYL) ointment Apply topically 2 (two) times  daily. Patient not taking: Reported on 10/12/2015 09/06/15   Kathlen ModyVijaya Akula, MD  lacosamide (VIMPAT) 50 MG TABS tablet Take 2 tablets (100 mg total) by mouth 2 (two) times daily. Patient not taking: Reported on 10/12/2015 06/06/15   Renae FickleMackenzie Short, MD  potassium chloride SA (K-DUR,KLOR-CON) 20 MEQ tablet Take 2 tablets (40 mEq total) by mouth 2 (two) times daily. Patient not taking: Reported on 10/12/2015 09/06/15   Kathlen ModyVijaya Akula, MD     Family History  Problem Relation Age of Onset  . Heart disease Mother 2845    MI    Social History   Social History  . Marital Status: Single    Spouse Name: N/A  . Number of Children: N/A  . Years of Education: N/A   Social History Main Topics  . Smoking status: Former Smoker    Types: Cigarettes  . Smokeless tobacco: Never Used     Comment: trying to quit-2 cigarettes daily  . Alcohol Use: No     Comment: Very seldom  . Drug Use: No  . Sexual Activity: No   Other Topics Concern  . None   Social History Narrative    Review of Systems: A 12 point ROS discussed and pertinent positives are indicated in the HPI above.  All other systems are negative.  Review of Systems  Constitutional: Negative for fever, activity change and appetite change.  Gastrointestinal: Negative for nausea and vomiting.    Vital Signs: BP 181/114 mmHg  Pulse 101  Temp(Src) 98.6 F (37 C) (Oral)  Resp 20  Ht 5\' 3"  (1.6 m)  Wt 182 lb (82.555 kg)  BMI 32.25 kg/m2  SpO2 98%  Physical Exam  Cardiovascular: Normal rate and regular rhythm.   Pulmonary/Chest: Effort normal.  Abdominal: Soft.  Musculoskeletal:  Does not follow commands  Neurological:  No response  Skin: Skin is warm.  Psychiatric:  Sister Harvie BridgeMae consented via phone for procedure  Nursing note and vitals reviewed.   Mallampati Score:  MD Evaluation Airway: WNL Heart: WNL Abdomen: WNL Chest/ Lungs: WNL ASA  Classification: 3 Mallampati/Airway Score:  (unable to follow commands---not  assessed)  Imaging: [place] Renal  10/13/2015  CLINICAL DATA:  Acute renal failure.  Hypertension. EXAM: RENAL / URINARY TRACT ULTRASOUND COMPLETE COMPARISON:  CT 08/29/2015. FINDINGS: Right Kidney: Length: 12.2 cm. Echogenicity within normal limits. No mass or hydronephrosis visualized. Left Kidney: Length: 14.0 cm. Echogenicity within normal limits. No mass or hydronephrosis visualized. Previously identified low-density lesion in the left kidney noted on CT of 08/29/2015 is no longer identified. The left kidney is larger than the right. Although color-flow is noted to both kidneys Given prior IVC thrombosis the possibility of renal vein thrombosis on the left cannot be excluded. Other etiologies of renal enlargement including pyelonephritis cannot be excluded . Bladder: Foley catheter noted in the bladder.  The bladder is nondistended. IMPRESSION: 1. Left renal enlargement. Although color flow is noted to both kidneys, given prior IVC thrombosis the possibility of  left renal vein thrombosis cannot be excluded. 2. Previously identified left renal lesion noted on CT of 08/29/2015 is no longer identified. No evidence of hydronephrosis. Foley catheter is in the bladder. No evidence of bladder distention. Electronically Signed   By: Maisie Fus  Register   On: 10/13/2015 07:51   Dg Chest Port 1 View  10/13/2015  CLINICAL DATA:  28 -year-old female with respiratory distress, hypotension. Initial encounter. EXAM: PORTABLE CHEST 1 VIEW COMPARISON:  10/12/2015 and earlier. FINDINGS: Portable AP semi upright view at 0447 hours. Stable right IJ central line. Continued low lung volumes. Pulmonary vascular congestion without overt edema appears regressed. No pneumothorax, pleural effusion or confluent pulmonary opacity. Mildly increased gaseous distension of bowel loops in the visible upper abdomen. Stable cholecystectomy clips. IMPRESSION: Regressed pulmonary vascular congestion, no overt edema. No other cardiopulmonary  abnormality. Electronically Signed   By: Odessa Fleming M.D.   On: 10/13/2015 07:10   Dg Chest Portable 1 View  10/12/2015  CLINICAL DATA:  Central line placement EXAM: PORTABLE CHEST 1 VIEW COMPARISON:  10/12/2015 FINDINGS: Cardiomediastinal silhouette is stable. No acute infiltrate or pleural effusion. No pulmonary edema. There is right IJ central line with tip in SVC right atrium junction. There is no pneumothorax. IMPRESSION: Right IJ central line with tip in SVC right atrium junction. No pneumothorax. Electronically Signed   By: Natasha Mead M.D.   On: 10/12/2015 18:42   Dg Chest Portable 1 View  10/12/2015  CLINICAL DATA:  Hypotension EXAM: PORTABLE CHEST 1 VIEW COMPARISON:  September 05, 2015 FINDINGS: There is no edema or consolidation. Heart size and pulmonary vascularity are normal. No adenopathy. There is cortical irregularity along the lateral aspect of the right scapula. IMPRESSION: No edema or consolidation. Cortical irregularity is noted along the lateral right scapula. Dedicated images of the right scapula advised to further assess. Electronically Signed   By: Bretta Bang III M.D.   On: 10/12/2015 16:21    Labs:  CBC:  Recent Labs  10/16/15 0445 10/17/15 0644 10/18/15 1400 10/19/15 0405  WBC 7.6 9.4 7.4 6.4  HGB 8.9* 8.8* 8.9* 8.3*  HCT 27.3* 27.0* 27.8* 25.6*  PLT 264 247 192 152    COAGS:  Recent Labs  08/30/15 0036 10/19/15 1215  INR 1.47 1.17  APTT 29 26    BMP:  Recent Labs  10/16/15 0445 10/17/15 0644 10/18/15 1400 10/19/15 0405  NA 154* 157* 154* 151*  K 3.2* 3.6 3.2* 3.2*  CL >130* >130* >130* 126*  CO2 16* 16* 18* 19*  GLUCOSE 81 79 114* 99  BUN 42* 32* 20 16  CALCIUM 8.7* 8.8* 8.8* 8.4*  CREATININE 0.89 0.91 0.70 0.63  GFRNONAA >60 >60 >60 >60  GFRAA >60 >60 >60 >60    LIVER FUNCTION TESTS:  Recent Labs  06/01/15 1703 08/28/15 1415 10/12/15 1600 10/12/15 2145  BILITOT 0.7 0.6 0.4 0.3  AST ALT 41 11* 17 19  ALKPHOS  133* 71 72 69  PROT 7.2 7.4 6.3* 5.7*  ALBUMIN 3.6 2.5* 2.3* 2.0*    TUMOR MARKERS: No results for input(s): AFPTM, CEA, CA199, CHROMGRNA in the last 8760 hours.  Assessment and Plan:  Quadriplegia Hx rupture cerebral aneurysm 2014 Lives in nursing facility No response; non verbal Sacral ulcers; slow healing FTT; malnutrition Palliative consult--family wants percutaneous Gastric tube for hydration and nutritional support Scheduled now for perc G tube in IR possibly 12/15 Risks and Benefits discussed with the patient's sister [name] including, but  not limited to the need for a barium enema during the procedure, bleeding, infection, peritonitis, or damage to adjacent structures. All of her questions were answered, she is agreeable to proceed. Consent signed and in chart.   Thank you for this interesting consult.  I greatly enjoyed meeting Ivonne [place] and look forward to participating in their care.  A copy of this report was sent to the requesting provider on this date.  Signed: Elisheva Fallas A 10/19/2015, 1:43 PM    I spent a total of 40 Minutes    in face to face in clinical consultation, greater than 50% of which was counseling/coordinating care for percutaneous gastric tube placement

## 2015-10-19 NOTE — Progress Notes (Signed)
PROGRESS NOTE  Donald PoreLaura Marie Overturf MWU:[id number] DOB: 07/20/1966 DOA: 10/12/2015 PCP: [name], MD  HPI: Ms Lina SayreLaura Lafitte is a 49 year old female with a past medical history ruptured cerebral aneurysm in 2014, with resultant quadriplegia, nonverbal at baseline, current resident at skilled nursing facility, [facility] when she was found by nursing staff to be lethargic on admission she was septic evidence by a pressure of 76/15, temperature of 94.2, respiratory rate of 31. She was admitted to the pulmonary critical care service. She required IV pressors support. She showed clinical improvement and was transferred to the medicine service on 10/15/2015. Hospitalization complicated by failure to thrive, having minimal by mouth intake. Palliative care was consulted family meeting held on 10/18/2015 to determine medical goals of care. Family members expressing wishes for placement of PEG tube for nutritional support. Interventional radiology consulted.  Subjective / 24 H Interval events - non verbal   Assessment/Plan: Active Problems:   Pressure ulcer   Sepsis (HCC)   UTI (lower urinary tract infection)   Acute renal failure (ARF) (HCC)   Septic shock (HCC)  Septic shock - Evidenced by blood pressure of 76/15, temperature of 94.2, respiratory of 31, requiring IV pressor support. She was initially admitted to the intensive care unit under the care of pulmonary critical care medicine. - unclear source, urine culture negative however she is growing MRSA from her wound.  - Blood cultures drawn on 10/12/2015 growing coag negative staph from 1/2 bottles. Suspect that this reflects contaminant.  - Transitioned to Ceftin 250 mg PO BID. Given negative urine cultures and positive wound cultures, with stage IV sacral decub with exposed bone suspect may need to treat as osteo. I consulted ID today, discussed with Dr. Ninetta LightsHatcher, appreciate input - obtain sed  rate and CRP  Acute kidney injury - She initially presented with a BUN of 113 and creatinine of 3.15, likely secondary to septic shock. - Creatinine has gradually improved with fluids   Question hematochezia - Overnight nursing staff reporting bloody stools, found to be Guaiac negative - Her Hg remains stable - I don't think she is actively bleeding, however will continue to monitor  Hypernatremia. - On presentation had a sodium of 163, due to poor po intake, PEG tube placement pending - continue D5W  History of DVT. - Currently not on anticoagulation due to history of GI bleed. - Status post IVC filter placement  Stage IV decubitus ulcer - I evaluated wound, she has an 8 cm x 6 cm sacral decubitus ulcer with exposed bone. There was mild erythema involving margins. No purulent drainage - Patient was evaluated by wound care during this hospitalization, recommended continuing saline moist to moist dressings, adding prevalon boots. - Ordered air mattress   Hypokalemia - AM labs showing K of 3.2  - replete IV  History of HTN - Blood pressures are now elevated with SBP's in the 150's/100's  - She is not taking by mouth, will provide IV antihypertensive agents as needed.  Failure to thrive - She has had minimal PO intake in the last several days.  - She had been treated with broad spectrum antibiotics with IV Fortaz and IV Vancomycin. - Workup has not shown a reversible cause of her decline. Labs revealed a white count within normal limits at 7400. K function stable, sodium trended down to 154 from 157. She has been afebrile. Her sats are upper 90s on room air.  - Palliative care was consulted  for facilitation indeterminate medical goals of care. Family meeting was held on 10/18/2015. - Family members would like PEG tube placement for nutritional support. Interventional radiology was consulted for PEG placement   Diet: DIET - DYS 1 Room service appropriate?: Yes; Fluid  consistency:: Thin Fluids: D5W DVT Prophylaxis: SCD  Code Status: Full Code Family Communication: no family bedside  Disposition Plan: TBD  Barriers to discharge: PEG placement, ID evaluation  Consultants:  ID  Palliative   Procedures:  None    Antibiotics  IV vancomycin stopped on 10/16/2015  IV ceftazidime stopped on 10/16/2015  Ceftin started on 10/16/2015   Studies  No results found.  Objective  Filed Vitals:   10/19/15 0531 10/19/15 0534 10/19/15 0916 10/19/15 0922  BP: 169/125 144/118 181/114 181/114  Pulse: 83 88 95 101  Temp:  97.5 F (36.4 C) 98.6 F (37 C)   TempSrc:  Oral Oral   Resp: 20 20    Height:      Weight:  82.555 kg (182 lb)    SpO2: 100% 100% 98%     Intake/Output Summary (Last 24 hours) at 10/19/15 1324 Last data filed at 10/19/15 1100  Gross per 24 hour  Intake      0 ml  Output   1150 ml  Net  -1150 ml   Filed Weights   10/14/15 0500 10/15/15 0500 10/19/15 0534  Weight: 83.8 kg (184 lb 11.9 oz) 80.8 kg (178 lb 2.1 oz) 82.555 kg (182 lb)    Exam:  GENERAL: NAD, non verbal. Can barely track me with her eyes  HEENT: no scleral icterus, PERRL  NECK: supple, no LAD  LUNGS: CTA biL, no wheezing  HEART: RRR without MRG  ABDOMEN: soft, non tender  MSK: 8x6 cm large decub ulcer, bone exposed  EXTREMITIES: no clubbing / cyanosis  NEUROLOGIC: does not follow commands.    Data Reviewed: Basic Metabolic Panel:  Recent Labs Lab 10/13/15 0450  10/15/15 0347 10/16/15 0445 10/17/15 0644 10/18/15 1400 10/19/15 0405  NA  --   < > 154* 154* 157* 154* 151*  K  --   < > 3.3* 3.2* 3.6 3.2* 3.2*  CL  --   < > >130* >130* >130* >130* 126*  CO2  --   < > 16* 16* 16* 18* 19*  GLUCOSE  --   < > 82 81 79 114* 99  BUN  --   < > 58* 42* 32* 20 16  CREATININE  --   < > 1.22* 0.89 0.91 0.70 0.63  CALCIUM  --   < > 8.1* 8.7* 8.8* 8.8* 8.4*  MG 1.7  --   --   --   --   --   --   PHOS 1.9*  --   --   --   --   --   --   < > =  values in this interval not displayed. Liver Function Tests:  Recent Labs Lab 10/12/15 1600 10/12/15 2145  AST 25 16  ALT 17 19  ALKPHOS 72 69  BILITOT 0.4 0.3  PROT 6.3* 5.7*  ALBUMIN 2.3* 2.0*    Recent Labs Lab 10/12/15 1600  LIPASE 23    Recent Labs Lab 10/12/15 2142  AMMONIA 23   CBC:  Recent Labs Lab 10/12/15 1600  10/14/15 0415  10/15/15 0347 10/16/15 0445 10/17/15 0644 10/18/15 1400 10/19/15 0405  WBC 9.4  < > 6.1  --  9.7 7.6 9.4 7.4 6.4  NEUTROABS 5.9  --  3.8  --  4.7  --   --   --   --   HGB 7.3*  < > 6.5*  < > 8.5* 8.9* 8.8* 8.9* 8.3*  HCT 24.2*  < > 20.9*  < > 26.3* 27.3* 27.0* 27.8* 25.6*  MCV 92.7  < > 91.3  --  87.7 87.5 88.5 88.0 88.0  PLT 279  < > 236  --  251 264 247 192 152  < > = values in this interval not displayed.  BNP (last 3 results)  Recent Labs  03/18/15 1240 08/29/15 1305  BNP 69.7 47.2   CBG:  Recent Labs Lab 10/12/15 2058  GLUCAP 140*    Recent Results (from the past 240 hour(s))  Blood culture (routine x 2)     Status: None   Collection Time: 10/12/15  4:00 PM  Result Value Ref Range Status   Specimen Description BLOOD RIGHT ARM  Final   Special Requests BOTTLES DRAWN AEROBIC AND ANAEROBIC 5CC  Final   Culture  Setup Time   Final    GRAM POSITIVE COCCI IN CLUSTERS ANAEROBIC BOTTLE ONLY CRITICAL RESULT CALLED TO, READ BACK BY AND VERIFIED WITH: Erby Pian RN 2006 10/13/15 A BROWNING    Culture   Final    STAPHYLOCOCCUS SPECIES (COAGULASE NEGATIVE) THE SIGNIFICANCE OF ISOLATING THIS ORGANISM FROM A SINGLE SET OF BLOOD CULTURES WHEN MULTIPLE SETS ARE DRAWN IS UNCERTAIN. PLEASE NOTIFY THE MICROBIOLOGY DEPARTMENT WITHIN ONE WEEK IF SPECIATION AND SENSITIVITIES ARE REQUIRED.    Report Status 10/14/2015 FINAL  Final  Urine culture     Status: None   Collection Time: 10/12/15  4:55 PM  Result Value Ref Range Status   Specimen Description URINE, RANDOM  Final   Special Requests NONE  Final   Culture MULTIPLE  SPECIES PRESENT, SUGGEST RECOLLECTION  Final   Report Status 10/13/2015 FINAL  Final  MRSA PCR Screening     Status: None   Collection Time: 10/12/15  9:49 PM  Result Value Ref Range Status   MRSA by PCR NEGATIVE NEGATIVE Final    Comment:        The GeneXpert MRSA Assay (FDA approved for NASAL specimens only), is one component of a comprehensive MRSA colonization surveillance program. It is not intended to diagnose MRSA infection nor to guide or monitor treatment for MRSA infections.   Blood culture (routine x 2)     Status: None   Collection Time: 10/12/15 10:12 PM  Result Value Ref Range Status   Specimen Description BLOOD CENTRAL LINE  Final   Special Requests BOTTLES DRAWN AEROBIC AND ANAEROBIC 10CC  Final   Culture NO GROWTH 5 DAYS  Final   Report Status 10/17/2015 FINAL  Final  Wound culture     Status: None   Collection Time: 10/13/15  5:55 AM  Result Value Ref Range Status   Specimen Description WOUND  Final   Special Requests WOUND SACRUM  Final   Gram Stain   Final    FEW WBC PRESENT,BOTH PMN AND MONONUCLEAR NO SQUAMOUS EPITHELIAL CELLS SEEN NO ORGANISMS SEEN Performed at [facility]    Culture   Final    MODERATE METHICILLIN RESISTANT STAPHYLOCOCCUS AUREUS Note: RIFAMPIN AND GENTAMICIN SHOULD NOT BE USED AS SINGLE DRUGS FOR TREATMENT OF STAPH INFECTIONS. CRITICAL RESULT CALLED TO, READ BACK BY AND VERIFIED WITH: KELLY DUFFY 10/17/15 1012 BY SMITHERSJ Performed at [facility]    Report Status 10/17/2015 FINAL  Final   Organism ID,  Bacteria METHICILLIN RESISTANT STAPHYLOCOCCUS AUREUS  Final      Susceptibility   Methicillin resistant staphylococcus aureus - MIC*    CLINDAMYCIN >=8 RESISTANT Resistant     ERYTHROMYCIN >=8 RESISTANT Resistant     GENTAMICIN <=0.5 SENSITIVE Sensitive     LEVOFLOXACIN >=8 RESISTANT Resistant     OXACILLIN >=4 RESISTANT Resistant     RIFAMPIN <=0.5 SENSITIVE Sensitive     TRIMETH/SULFA <=10 SENSITIVE Sensitive      VANCOMYCIN 1 SENSITIVE Sensitive     TETRACYCLINE <=1 SENSITIVE Sensitive     * MODERATE METHICILLIN RESISTANT STAPHYLOCOCCUS AUREUS  Urine culture     Status: None   Collection Time: 10/13/15  6:01 PM  Result Value Ref Range Status   Specimen Description URINE, CATHETERIZED  Final   Special Requests NONE  Final   Culture 8,000 COLONIES/mL INSIGNIFICANT GROWTH  Final   Report Status 10/14/2015 FINAL  Final     Scheduled Meds: . cefUROXime  250 mg Oral Q12H  . famotidine  20 mg Oral BID  . feeding supplement (ENSURE ENLIVE)  237 mL Oral TID BM  . free water  200 mL Oral 3 times per day  . lacosamide  100 mg Oral BID  . levETIRAcetam  1,500 mg Oral BID  . lisinopril  20 mg Oral Daily  . metoprolol tartrate  50 mg Oral BID  . multivitamin with minerals  1 tablet Oral Daily   Continuous Infusions: . dextrose 50 mL/hr at 10/18/15 0800    [name] Pert, MD [facility] Pager [phone]. If 7 PM - 7 AM, please contact night-coverage at [email or web address], password [facility] 10/19/2015, 1:24 PM  LOS: 7 days

## 2015-10-19 NOTE — Progress Notes (Signed)
Patient's BP 178/123 on right arm and 160/125 on left arm.  On-call floor coverage L. [place] has been notified.  Awaiting orders and will continue to monitor.

## 2015-10-20 ENCOUNTER — Inpatient Hospital Stay (HOSPITAL_COMMUNITY): Payer: 59

## 2015-10-20 DIAGNOSIS — R1310 Dysphagia, unspecified: Secondary | ICD-10-CM

## 2015-10-20 LAB — BASIC METABOLIC PANEL
Anion gap: 7 (ref 5–15)
BUN: 13 mg/dL (ref 6–20)
CALCIUM: 8.6 mg/dL — AB (ref 8.9–10.3)
CO2: 18 mmol/L — ABNORMAL LOW (ref 22–32)
Chloride: 122 mmol/L — ABNORMAL HIGH (ref 101–111)
Creatinine, Ser: 0.61 mg/dL (ref 0.44–1.00)
GFR calc Af Amer: >60 mL/min (ref >60)
GLUCOSE: 100 mg/dL — AB (ref 65–99)
Potassium: 3.2 mmol/L — ABNORMAL LOW (ref 3.5–5.1)
Sodium: 147 mmol/L — ABNORMAL HIGH (ref 135–145)

## 2015-10-20 LAB — GLUCOSE, CAPILLARY: GLUCOSE-CAPILLARY: 124 mg/dL — AB (ref 65–99)

## 2015-10-20 LAB — CBC
HCT: 25.5 % — ABNORMAL LOW (ref 36.0–46.0)
Hemoglobin: 8.4 g/dL — ABNORMAL LOW (ref 12.0–15.0)
MCH: 29.1 pg (ref 26.0–34.0)
MCHC: 32.9 g/dL (ref 30.0–36.0)
MCV: 88.2 fL (ref 78.0–100.0)
Platelets: 129 10*3/uL — ABNORMAL LOW (ref 150–400)
RBC: 2.89 MIL/uL — ABNORMAL LOW (ref 3.87–5.11)
RDW: 21.8 % — AB (ref 11.5–15.5)
WBC: 6.8 10*3/uL (ref 4.0–10.5)

## 2015-10-20 MED ORDER — GLUCAGON HCL RDNA (DIAGNOSTIC) 1 MG IJ SOLR
INTRAMUSCULAR | Status: AC
  Filled 2015-10-20: qty 1

## 2015-10-20 MED ORDER — LIDOCAINE HCL 1 % IJ SOLN
INTRAMUSCULAR | Status: AC
  Filled 2015-10-20: qty 20

## 2015-10-20 MED ORDER — FENTANYL CITRATE (PF) 100 MCG/2ML IJ SOLN
INTRAMUSCULAR | Status: AC | PRN
  Administered 2015-10-20: 50 ug via INTRAVENOUS

## 2015-10-20 MED ORDER — VANCOMYCIN HCL IN DEXTROSE 1-5 GM/200ML-% IV SOLN
1000.0000 mg | INTRAVENOUS | Status: AC
  Administered 2015-10-20: 1000 mg via INTRAVENOUS
  Filled 2015-10-20: qty 200

## 2015-10-20 MED ORDER — IOHEXOL 300 MG/ML  SOLN
50.0000 mL | Freq: Once | INTRAMUSCULAR | Status: AC | PRN
  Administered 2015-10-20: 10 mL via INTRAVENOUS

## 2015-10-20 MED ORDER — FENTANYL CITRATE (PF) 100 MCG/2ML IJ SOLN
INTRAMUSCULAR | Status: AC
  Filled 2015-10-20: qty 2

## 2015-10-20 MED ORDER — MIDAZOLAM HCL 2 MG/2ML IJ SOLN
INTRAMUSCULAR | Status: AC
  Filled 2015-10-20: qty 2

## 2015-10-20 MED ORDER — POTASSIUM CHLORIDE 10 MEQ/50ML IV SOLN
10.0000 meq | Freq: Once | INTRAVENOUS | Status: AC
  Administered 2015-10-20: 10 meq via INTRAVENOUS
  Filled 2015-10-20: qty 50

## 2015-10-20 MED ORDER — POTASSIUM CHLORIDE 10 MEQ/50ML IV SOLN
10.0000 meq | INTRAVENOUS | Status: AC
  Administered 2015-10-20 (×2): 10 meq via INTRAVENOUS
  Filled 2015-10-20 (×3): qty 50

## 2015-10-20 MED ORDER — MIDAZOLAM HCL 2 MG/2ML IJ SOLN
INTRAMUSCULAR | Status: AC | PRN
  Administered 2015-10-20: 1 mg via INTRAVENOUS

## 2015-10-20 NOTE — Progress Notes (Signed)
INFECTIOUS DISEASE PROGRESS NOTE  ID: [name] is a 49 y.o. female with  Active Problems:   Pressure ulcer   Sepsis (HCC)   UTI (lower urinary tract infection)   Acute renal failure (ARF) (HCC)   Septic shock (HCC)  Subjective: Awake and alert.  Abtx:  Anti-infectives    Start     Dose/Rate Route Frequency Ordered Stop   10/16/15 1800  vancomycin (VANCOCIN) IVPB 1000 mg/200 mL premix  Status:  Discontinued     1,000 mg 200 mL/hr over 60 Minutes Intravenous Every 12 hours 10/16/15 1340 10/16/15 1435   10/16/15 1530  cefUROXime (CEFTIN) 250 MG/5ML suspension 250 mg     250 mg Oral Every 12 hours 10/16/15 1437     10/14/15 1700  levofloxacin (LEVAQUIN) IVPB 500 mg  Status:  Discontinued     500 mg 100 mL/hr over 60 Minutes Intravenous Every 48 hours 10/12/15 2125 10/14/15 1136   10/14/15 1200  cefTAZidime (FORTAZ) 2 g in dextrose 5 % 50 mL IVPB  Status:  Discontinued     2 g 100 mL/hr over 30 Minutes Intravenous Every 12 hours 10/14/15 1144 10/16/15 1435   10/14/15 1145  aztreonam (AZACTAM) 1 g in dextrose 5 % 50 mL IVPB  Status:  Discontinued     1 g 100 mL/hr over 30 Minutes Intravenous 3 times per day 10/14/15 1135 10/14/15 1144   10/13/15 1800  vancomycin (VANCOCIN) IVPB 1000 mg/200 mL premix  Status:  Discontinued     1,000 mg 200 mL/hr over 60 Minutes Intravenous Every 24 hours 10/13/15 1019 10/16/15 1340   10/13/15 0330  aztreonam (AZACTAM) 1 g in dextrose 5 % 50 mL IVPB  Status:  Discontinued     1 g 100 mL/hr over 30 Minutes Intravenous Every 8 hours 10/12/15 1959 10/12/15 2111   10/12/15 2130  fluconazole (DIFLUCAN) IVPB 200 mg  Status:  Discontinued     200 mg 100 mL/hr over 60 Minutes Intravenous Every 24 hours 10/12/15 2102 10/14/15 1201   10/12/15 2115  aztreonam (AZACTAM) 2 g in dextrose 5 % 50 mL IVPB  Status:  Discontinued     2 g 100 mL/hr over 30 Minutes Intravenous  Once 10/12/15 2111 10/12/15 2123   10/12/15 2000  vancomycin (VANCOCIN) 500 mg  in sodium chloride 0.9 % 100 mL IVPB     500 mg 100 mL/hr over 60 Minutes Intravenous  Once 10/12/15 1957 10/12/15 2244   10/12/15 1715  levofloxacin (LEVAQUIN) IVPB 750 mg     750 mg 100 mL/hr over 90 Minutes Intravenous  Once 10/12/15 1713 10/12/15 1906   10/12/15 1715  aztreonam (AZACTAM) 2 g in dextrose 5 % 50 mL IVPB     2 g 100 mL/hr over 30 Minutes Intravenous  Once 10/12/15 1713 10/12/15 2008   10/12/15 1715  vancomycin (VANCOCIN) IVPB 1000 mg/200 mL premix     1,000 mg 200 mL/hr over 60 Minutes Intravenous  Once 10/12/15 1713 10/12/15 1906      Medications:  Scheduled: . cefUROXime  250 mg Oral Q12H  . famotidine  20 mg Oral BID  . feeding supplement (ENSURE ENLIVE)  237 mL Oral TID BM  . free water  200 mL Oral 3 times per day  . lacosamide  100 mg Oral BID  . levETIRAcetam  1,500 mg Oral BID  . lisinopril  20 mg Oral Daily  . metoprolol tartrate  50 mg Oral BID  . multivitamin with minerals  1 tablet Oral Daily    Objective: Vital signs in last 24 hours: Temp:  [98.1 F (36.7 C)-98.8 F (37.1 C)] 98.8 F (37.1 C) (12/15 0606) Pulse Rate:  [83-138] 115 (12/15 0927) Resp:  [18] 18 (12/15 0013) BP: (117-207)/(64-163) 153/106 mmHg (12/15 0927) SpO2:  [95 %-100 %] 98 % (12/15 0608) Weight:  [81.194 kg (179 lb)] 81.194 kg (179 lb) (12/15 0617)   General appearance: alert and no distress Incision/Wound: large sacral decubitus, no d/c but foul smelling.   Lab Results  Recent Labs  10/19/15 0405 10/20/15 0450  WBC 6.4 6.8  HGB 8.3* 8.4*  HCT 25.6* 25.5*  NA 151* 147*  K 3.2* 3.2*  CL 126* 122*  CO2 19* 18*  BUN 16 13  CREATININE 0.63 0.61   Liver Panel No results for input(s): PROT, ALBUMIN, AST, ALT, ALKPHOS, BILITOT, BILIDIR, IBILI in the last 72 hours. Sedimentation Rate  Recent Labs  10/19/15 1205  ESRSEDRATE 38*   C-Reactive Protein  Recent Labs  10/19/15 1205  CRP 1.6*    Microbiology: Recent Results (from the past 240 hour(s))    Blood culture (routine x 2)     Status: None   Collection Time: 10/12/15  4:00 PM  Result Value Ref Range Status   Specimen Description BLOOD RIGHT ARM  Final   Special Requests BOTTLES DRAWN AEROBIC AND ANAEROBIC 5CC  Final   Culture  Setup Time   Final    GRAM POSITIVE COCCI IN CLUSTERS ANAEROBIC BOTTLE ONLY CRITICAL RESULT CALLED TO, READ BACK BY AND VERIFIED WITH: Erby Pian RN 2006 10/13/15 A BROWNING    Culture   Final    STAPHYLOCOCCUS SPECIES (COAGULASE NEGATIVE) THE SIGNIFICANCE OF ISOLATING THIS ORGANISM FROM A SINGLE SET OF BLOOD CULTURES WHEN MULTIPLE SETS ARE DRAWN IS UNCERTAIN. PLEASE NOTIFY THE MICROBIOLOGY DEPARTMENT WITHIN ONE WEEK IF SPECIATION AND SENSITIVITIES ARE REQUIRED.    Report Status 10/14/2015 FINAL  Final  Urine culture     Status: None   Collection Time: 10/12/15  4:55 PM  Result Value Ref Range Status   Specimen Description URINE, RANDOM  Final   Special Requests NONE  Final   Culture MULTIPLE SPECIES PRESENT, SUGGEST RECOLLECTION  Final   Report Status 10/13/2015 FINAL  Final  MRSA PCR Screening     Status: None   Collection Time: 10/12/15  9:49 PM  Result Value Ref Range Status   MRSA by PCR NEGATIVE NEGATIVE Final    Comment:        The GeneXpert MRSA Assay (FDA approved for NASAL specimens only), is one component of a comprehensive MRSA colonization surveillance program. It is not intended to diagnose MRSA infection nor to guide or monitor treatment for MRSA infections.   Blood culture (routine x 2)     Status: None   Collection Time: 10/12/15 10:12 PM  Result Value Ref Range Status   Specimen Description BLOOD CENTRAL LINE  Final   Special Requests BOTTLES DRAWN AEROBIC AND ANAEROBIC 10CC  Final   Culture NO GROWTH 5 DAYS  Final   Report Status 10/17/2015 FINAL  Final  Wound culture     Status: None   Collection Time: 10/13/15  5:55 AM  Result Value Ref Range Status   Specimen Description WOUND  Final   Special Requests WOUND SACRUM   Final   Gram Stain   Final    FEW WBC PRESENT,BOTH PMN AND MONONUCLEAR NO SQUAMOUS EPITHELIAL CELLS SEEN NO ORGANISMS SEEN Performed at [facility]    Culture   Final    MODERATE METHICILLIN RESISTANT STAPHYLOCOCCUS AUREUS Note: RIFAMPIN AND GENTAMICIN SHOULD NOT BE USED AS SINGLE DRUGS FOR TREATMENT OF STAPH INFECTIONS. CRITICAL RESULT CALLED TO, READ BACK BY AND VERIFIED WITH: KELLY DUFFY 10/17/15 1012 BY SMITHERSJ Performed at [facility]    Report Status 10/17/2015 FINAL  Final   Organism ID, Bacteria METHICILLIN RESISTANT STAPHYLOCOCCUS AUREUS  Final      Susceptibility   Methicillin resistant staphylococcus aureus - MIC*    CLINDAMYCIN >=8 RESISTANT Resistant     ERYTHROMYCIN >=8 RESISTANT Resistant     GENTAMICIN <=0.5 SENSITIVE Sensitive     LEVOFLOXACIN >=8 RESISTANT Resistant     OXACILLIN >=4 RESISTANT Resistant     RIFAMPIN <=0.5 SENSITIVE Sensitive     TRIMETH/SULFA <=10 SENSITIVE Sensitive     VANCOMYCIN 1 SENSITIVE Sensitive     TETRACYCLINE <=1 SENSITIVE Sensitive     * MODERATE METHICILLIN RESISTANT STAPHYLOCOCCUS AUREUS  Urine culture     Status: None   Collection Time: 10/13/15  6:01 PM  Result Value Ref Range Status   Specimen Description URINE, CATHETERIZED  Final   Special Requests NONE  Final   Culture 8,000 COLONIES/mL INSIGNIFICANT GROWTH  Final   Report Status 10/14/2015 FINAL  Final    Studies/Results: No results found.   Assessment/Plan: Sepsis Resolved.   Decubitus ulcers Air flow bed Nutrition eval Plastics/surgery eval consider bone bx/cx if possible Wound care Otherwise would treat her as osteo due to the deep nature of her wound.  Plan for long term anbx- vanco/ceftriaxone or IV not possible doxy/cipro- start after surgery eval.   Total days of antibiotics: 7 vanoc/aztreonam --> vanco/ceftaz --> ceftin (12-11)         Johny SaxJeffrey Dwaine Pringle Infectious Diseases (pager) [phone] [email or web address] 10/20/2015,  9:50 AM  LOS: 8 days

## 2015-10-20 NOTE — Sedation Documentation (Signed)
Patient is resting comfortably. Agitation decreased

## 2015-10-20 NOTE — Progress Notes (Signed)
Spoke to son Leonia Coronaorre R/T g tube placement and he gave consent.I have also spoken to sister, May,She has also been informed and consented

## 2015-10-20 NOTE — Progress Notes (Signed)
Notified primary RN that Interventional Radiology inserts PICCs or this patient.   Instructed to have the MD cancel the IV Team order and enter for Interventional Radiology.

## 2015-10-20 NOTE — Progress Notes (Signed)
PROGRESS NOTE  [name] ZOX:[id number] DOB: [birth year] DOA: 10/12/2015 PCP: Ruthe Mannan, MD  HPI: [name] is a 49 year old female with a past medical history ruptured cerebral aneurysm in 2014, with resultant quadriplegia, nonverbal at baseline, current resident at skilled nursing facility, transferred to the emergency department at [facility] when she was found by nursing staff to be lethargic on admission she was septic evidence by a pressure of 76/15, temperature of 94.2, respiratory rate of 31. She was admitted to the pulmonary critical care service. She required IV pressors support. She showed clinical improvement and was transferred to the medicine service on 10/15/2015. Hospitalization complicated by failure to thrive, having minimal by mouth intake. Palliative care was consulted family meeting held on 10/18/2015 to determine medical goals of care. Family members expressing wishes for placement of PEG tube for nutritional support. Interventional radiology consulted.  Subjective / 24 H Interval events - non verbal, grinding her teeth   Assessment/Plan: Active Problems:   Pressure ulcer   Sepsis (HCC)   UTI (lower urinary tract infection)   Acute renal failure (ARF) (HCC)   Septic shock (HCC)  Septic shock - Evidenced by blood pressure of 76/15, temperature of 94.2, respiratory of 31, requiring IV pressor support. She was initially admitted to the intensive care unit under the care of pulmonary critical care medicine. - unclear source, urine culture negative however she is growing MRSA from her wound.  - Blood cultures drawn on 10/12/2015 growing coag negative staph from 1/2 bottles. Suspect that this reflects contaminant.  - ID consulted, discussed with Dr. Ninetta Lights today   Acute kidney injury - She initially presented with a BUN of 113 and creatinine of 3.15, likely secondary to septic shock. - Creatinine has gradually improved with fluids   Question  hematochezia - Overnight nursing staff reporting bloody stools, found to be Guaiac negative - Her Hg remains stable - I don't think she is actively bleeding, however will continue to monitor  Hypernatremia. - On presentation had a sodium of 163, due to poor po intake, PEG tube placement pending - continue D5W, sodium improving. 147 this morning  History of DVT - Currently not on anticoagulation due to history of GI bleed. - Status post IVC filter placement  Stage IV decubitus ulcer - I evaluated wound, she has an 8 cm x 6 cm sacral decubitus ulcer with exposed bone. There was mild erythema involving margins. No purulent drainage - Patient was evaluated by wound care during this hospitalization, recommended continuing saline moist to moist dressings, adding prevalon boots. - Ordered air mattress  - surgery to see, consulted today   Hypokalemia - AM labs showing K of 3.2  - replete IV again today   History of HTN - Blood pressures are now elevated with SBP's in the 150's/100's  - She is not taking by mouth, will provide IV antihypertensive agents as needed.  Failure to thrive - She has had minimal PO intake in the last several days.  - She had been treated with broad spectrum antibiotics with IV Fortaz and IV Vancomycin. - Workup has not shown a reversible cause of her decline. Labs revealed a white count within normal limits at 7400. K function stable, sodium trended down to 154 from 157. She has been afebrile. Her sats are upper 90s on room air.  - Palliative care was consulted for facilitation indeterminate medical goals of care. Family meeting was held on 10/18/2015. - Family members would like PEG tube  placement for nutritional support. Interventional radiology was consulted for PEG placement, plan for today    Diet: Diet NPO time specified Except for: Sips with Meds Fluids: D5W DVT Prophylaxis: SCD  Code Status: Full Code Family Communication: no family bedside    Disposition Plan: TBD  Barriers to discharge: PEG placement, ID evaluation  Consultants:  ID  Palliative   Procedures:  None    Antibiotics  IV vancomycin stopped on 10/16/2015  IV ceftazidime stopped on 10/16/2015  Ceftin started on 10/16/2015   Studies  No results found.  Objective  Filed Vitals:   10/20/15 0608 10/20/15 0617 10/20/15 0702 10/20/15 0927  BP: 171/124  117/66 153/106  Pulse: 124  124 115  Temp:      TempSrc:      Resp:      Height:      Weight:  81.194 kg (179 lb)    SpO2: 98%       Intake/Output Summary (Last 24 hours) at 10/20/15 1436 Last data filed at 10/20/15 1117  Gross per 24 hour  Intake 1352.5 ml  Output   1200 ml  Net  152.5 ml   Filed Weights   10/15/15 0500 10/19/15 0534 10/20/15 0617  Weight: 80.8 kg (178 lb 2.1 oz) 82.555 kg (182 lb) 81.194 kg (179 lb)    Exam:  GENERAL: NAD, non verbal. Can barely track me with her eyes  HEENT: no scleral icterus, PERRL  NECK: supple, no LAD  LUNGS: CTA biL, no wheezing  HEART: RRR without MRG  ABDOMEN: soft, non tender  MSK: 8x6 cm large decub ulcer, bone exposed  EXTREMITIES: no clubbing / cyanosis  NEUROLOGIC: does not follow commands.    Data Reviewed: Basic Metabolic Panel:  Recent Labs Lab 10/16/15 0445 10/17/15 0644 10/18/15 1400 10/19/15 0405 10/20/15 0450  NA 154* 157* 154* 151* 147*  K 3.2* 3.6 3.2* 3.2* 3.2*  CL >130* >130* >130* 126* 122*  CO2 16* 16* 18* 19* 18*  GLUCOSE 81 79 114* 99 100*  BUN 42* 32* CREATININE 0.89 0.91 0.70 0.63 0.61  CALCIUM 8.7* 8.8* 8.8* 8.4* 8.6*   Liver Function Tests: No results for input(s): AST, ALT, ALKPHOS, BILITOT, PROT, ALBUMIN in the last 168 hours. No results for input(s): LIPASE, AMYLASE in the last 168 hours. No results for input(s): AMMONIA in the last 168 hours. CBC:  Recent Labs Lab 10/14/15 0415  10/15/15 0347 10/16/15 0445 10/17/15 0644 10/18/15 1400 10/19/15 0405 10/20/15 0450   WBC 6.1  --  9.7 7.6 9.4 7.4 6.4 6.8  NEUTROABS 3.8  --  4.7  --   --   --   --   --   HGB 6.5*  < > 8.5* 8.9* 8.8* 8.9* 8.3* 8.4*  HCT 20.9*  < > 26.3* 27.3* 27.0* 27.8* 25.6* 25.5*  MCV 91.3  --  87.7 87.5 88.5 88.0 88.0 88.2  PLT 236  --  251 264 247 192 152 129*  < > = values in this interval not displayed.  BNP (last 3 results)  Recent Labs  03/18/15 1240 08/29/15 1305  BNP 69.7 47.2   CBG: No results for input(s): GLUCAP in the last 168 hours.  Recent Results (from the past 240 hour(s))  Blood culture (routine x 2)     Status: None   Collection Time: 10/12/15  4:00 PM  Result Value Ref Range Status   Specimen Description BLOOD RIGHT ARM  Final   Special Requests BOTTLES  DRAWN AEROBIC AND ANAEROBIC 5CC  Final   Culture  Setup Time   Final    GRAM POSITIVE COCCI IN CLUSTERS ANAEROBIC BOTTLE ONLY CRITICAL RESULT CALLED TO, READ BACK BY AND VERIFIED WITH: Erby Pian RN 2006 10/13/15 A BROWNING    Culture   Final    STAPHYLOCOCCUS SPECIES (COAGULASE NEGATIVE) THE SIGNIFICANCE OF ISOLATING THIS ORGANISM FROM A SINGLE SET OF BLOOD CULTURES WHEN MULTIPLE SETS ARE DRAWN IS UNCERTAIN. PLEASE NOTIFY THE MICROBIOLOGY DEPARTMENT WITHIN ONE WEEK IF SPECIATION AND SENSITIVITIES ARE REQUIRED.    Report Status 10/14/2015 FINAL  Final  Urine culture     Status: None   Collection Time: 10/12/15  4:55 PM  Result Value Ref Range Status   Specimen Description URINE, RANDOM  Final   Special Requests NONE  Final   Culture MULTIPLE SPECIES PRESENT, SUGGEST RECOLLECTION  Final   Report Status 10/13/2015 FINAL  Final  MRSA PCR Screening     Status: None   Collection Time: 10/12/15  9:49 PM  Result Value Ref Range Status   MRSA by PCR NEGATIVE NEGATIVE Final    Comment:        The GeneXpert MRSA Assay (FDA approved for NASAL specimens only), is one component of a comprehensive MRSA colonization surveillance program. It is not intended to diagnose MRSA infection nor to guide or monitor  treatment for MRSA infections.   Blood culture (routine x 2)     Status: None   Collection Time: 10/12/15 10:12 PM  Result Value Ref Range Status   Specimen Description BLOOD CENTRAL LINE  Final   Special Requests BOTTLES DRAWN AEROBIC AND ANAEROBIC 10CC  Final   Culture NO GROWTH 5 DAYS  Final   Report Status 10/17/2015 FINAL  Final  Wound culture     Status: None   Collection Time: 10/13/15  5:55 AM  Result Value Ref Range Status   Specimen Description WOUND  Final   Special Requests WOUND SACRUM  Final   Gram Stain   Final    FEW WBC PRESENT,BOTH PMN AND MONONUCLEAR NO SQUAMOUS EPITHELIAL CELLS SEEN NO ORGANISMS SEEN Performed at [facility]    Culture   Final    MODERATE METHICILLIN RESISTANT STAPHYLOCOCCUS AUREUS Note: RIFAMPIN AND GENTAMICIN SHOULD NOT BE USED AS SINGLE DRUGS FOR TREATMENT OF STAPH INFECTIONS. CRITICAL RESULT CALLED TO, READ BACK BY AND VERIFIED WITH: KELLY DUFFY 10/17/15 1012 BY SMITHERSJ Performed at [facility]    Report Status 10/17/2015 FINAL  Final   Organism ID, Bacteria METHICILLIN RESISTANT STAPHYLOCOCCUS AUREUS  Final      Susceptibility   Methicillin resistant staphylococcus aureus - MIC*    CLINDAMYCIN >=8 RESISTANT Resistant     ERYTHROMYCIN >=8 RESISTANT Resistant     GENTAMICIN <=0.5 SENSITIVE Sensitive     LEVOFLOXACIN >=8 RESISTANT Resistant     OXACILLIN >=4 RESISTANT Resistant     RIFAMPIN <=0.5 SENSITIVE Sensitive     TRIMETH/SULFA <=10 SENSITIVE Sensitive     VANCOMYCIN 1 SENSITIVE Sensitive     TETRACYCLINE <=1 SENSITIVE Sensitive     * MODERATE METHICILLIN RESISTANT STAPHYLOCOCCUS AUREUS  Urine culture     Status: None   Collection Time: 10/13/15  6:01 PM  Result Value Ref Range Status   Specimen Description URINE, CATHETERIZED  Final   Special Requests NONE  Final   Culture 8,000 COLONIES/mL INSIGNIFICANT GROWTH  Final   Report Status 10/14/2015 FINAL  Final     Scheduled Meds: . cefUROXime  250 mg Oral  Q12H  . famotidine  20 mg Oral BID  . feeding supplement (ENSURE ENLIVE)  237 mL Oral TID BM  . free water  200 mL Oral 3 times per day  . lacosamide  100 mg Oral BID  . levETIRAcetam  1,500 mg Oral BID  . lisinopril  20 mg Oral Daily  . metoprolol tartrate  50 mg Oral BID  . multivitamin with minerals  1 tablet Oral Daily   Continuous Infusions: . dextrose 50 mL/hr at 10/20/15 0355    [name] Pertostin Gherghe, MD [facility] Pager [phone]. If 7 PM - 7 AM, please contact night-coverage at [email or web address], password [email or web address] 10/20/2015, 2:36 PM  LOS: 8 days

## 2015-10-20 NOTE — Progress Notes (Signed)
Subjective: Pt known to our service we saw her during her last admit in Oct.23-Nov1, 2016.   This pt suffered a cerebral aneurysmal bleed, seizures, paraplegia and stroke initially treated at [facility], 09/26/13 She is readmitted on 10/12/15 with encephalopathy, she is chronically non verbal, she was hypotensive and required pressors to support her blood pressure. Labs showed Na 163, creatinine of 3.15, BUN, lactate of 2.37, Hemoglobin of 7.3and UTI, but only 8000 colonies on culture. She was admitted to [facility] and has multiple medical issues as noted.  Sepsis attributed to E coli UTI sepsis.  She has a decubitus that we saw back in October.  We are ask to see again now for same ulcer.  This is not the source of her sepsis.  She remains non verbal and we did all her movements for her.   Objective: Vital signs in last 24 hours: Temp:  [98.1 F (36.7 C)-98.8 F (37.1 C)] 98.8 F (37.1 C) (12/15 0606) Pulse Rate:  [83-138] 115 (12/15 0927) Resp:  [18] 18 (12/15 0013) BP: (117-207)/(64-163) 153/106 mmHg (12/15 0927) SpO2:  [95 %-100 %] 98 % (12/15 0608) Weight:  [81.194 kg (179 lb)] 81.194 kg (179 lb) (12/15 0617) Last BM Date: 10/19/15  Intake/Output from previous day: 12/14 0701 - 12/15 0700 In: 1402.5 [P.O.:50; I.V.:1202.5; IV Piggyback:150] Out: 1550 [Urine:1550] Intake/Output this shift:    General appearance: alert and she does not follow and remains non verbal. Resp: clear to auscultation bilaterally Cardio: regular with some tachycardia Skin: see pictures below.   This picture is 09/20/15 This is her decubitus now.  It is 6 x 9 cm.  The skin does not have any erythema around open site.  There is 4 cm of undermining from the 9 o'clock to the 1 o'clock positions.  The entire site is clean.  There is some yellow fibrous tissue at the base but more than 90% is beefy and granular.  The nurse who has her today is packing it wet to dry and using Orange foam dressing to cover the site.  She is  incontinent of stool which is loose liquid like diarrhea.     Expand All Collapse All      S     Wound 08/30/15 Wound  08/30/15   09/02/15    Lab Results:   Recent Labs  10/19/15 0405 10/20/15 0450  WBC 6.4 6.8  HGB 8.3* 8.4*  HCT 25.6* 25.5*  PLT 152 129*    BMET  Recent Labs  10/19/15 0405 10/20/15 0450  NA 151* 147*  K 3.2* 3.2*  CL 126* 122*  CO2 19* 18*  GLUCOSE 99 100*  BUN 16 13  CREATININE 0.63 0.61  CALCIUM 8.4* 8.6*   PT/INR  Recent Labs  10/19/15 1215  LABPROT 15.1  INR 1.17    No results for input(s): AST, ALT, ALKPHOS, BILITOT, PROT, ALBUMIN in the last 168 hours.   Lipase     Component Value Date/Time   LIPASE 23 10/12/2015 1600     Studies/Results: No results found.  Medications: . cefUROXime  250 mg Oral Q12H  . famotidine  20 mg Oral BID  . feeding supplement (ENSURE ENLIVE)  237 mL Oral TID BM  . free water  200 mL Oral 3 times per day  . lacosamide  100 mg Oral BID  . levETIRAcetam  1,500 mg Oral BID  . lisinopril  20 mg Oral Daily  . metoprolol tartrate  50 mg Oral BID  . multivitamin with  minerals  1 tablet Oral Daily    Assessment/Plan Recurrent sepsis with E coli UTI Paraplegia secondary to Cerebral aneurysm with hx of stroke and seizures SAH due to cerebral aneurysm Non-verbal Hx of DVT Stage IV decubitus that is clean and doing well  Plan:  She is getting wet to dry dressing and is doing well.  I will put orders in for ongoing wound care, but what the AM nurse is doing works well.  I have no further recommendations.  This is not a source of sepsis currently     LOS: 8 days    Charlena Haub 10/20/2015

## 2015-10-20 NOTE — Progress Notes (Signed)
Back from IR . G Tube CDI to mid abd Surg dressing intact

## 2015-10-21 DIAGNOSIS — D696 Thrombocytopenia, unspecified: Secondary | ICD-10-CM

## 2015-10-21 LAB — BASIC METABOLIC PANEL
Anion gap: 5 (ref 5–15)
BUN: 11 mg/dL (ref 6–20)
CALCIUM: 8.3 mg/dL — AB (ref 8.9–10.3)
CO2: 19 mmol/L — AB (ref 22–32)
CREATININE: 0.55 mg/dL (ref 0.44–1.00)
Chloride: 119 mmol/L — ABNORMAL HIGH (ref 101–111)
GFR calc Af Amer: >60 mL/min (ref >60)
GFR calc non Af Amer: >60 mL/min (ref >60)
GLUCOSE: 102 mg/dL — AB (ref 65–99)
Potassium: 3.1 mmol/L — ABNORMAL LOW (ref 3.5–5.1)
Sodium: 143 mmol/L (ref 135–145)

## 2015-10-21 LAB — CBC
HCT: 22.7 % — ABNORMAL LOW (ref 36.0–46.0)
Hemoglobin: 7.7 g/dL — ABNORMAL LOW (ref 12.0–15.0)
MCH: 29.6 pg (ref 26.0–34.0)
MCHC: 33.9 g/dL (ref 30.0–36.0)
MCV: 87.3 fL (ref 78.0–100.0)
PLATELETS: 94 10*3/uL — AB (ref 150–400)
RBC: 2.6 MIL/uL — ABNORMAL LOW (ref 3.87–5.11)
RDW: 22.3 % — ABNORMAL HIGH (ref 11.5–15.5)
WBC: 6.1 10*3/uL (ref 4.0–10.5)

## 2015-10-21 LAB — OCCULT BLOOD X 1 CARD TO LAB, STOOL: Fecal Occult Bld: POSITIVE — AB

## 2015-10-21 MED ORDER — VANCOMYCIN HCL IN DEXTROSE 1-5 GM/200ML-% IV SOLN
1000.0000 mg | INTRAVENOUS | Status: DC
  Administered 2015-10-22 – 2015-10-24 (×3): 1000 mg via INTRAVENOUS
  Filled 2015-10-21 (×3): qty 200

## 2015-10-21 MED ORDER — POTASSIUM CHLORIDE 10 MEQ/50ML IV SOLN
10.0000 meq | INTRAVENOUS | Status: AC
  Administered 2015-10-21 (×5): 10 meq via INTRAVENOUS
  Filled 2015-10-21 (×5): qty 50

## 2015-10-21 MED ORDER — JEVITY 1.2 CAL PO LIQD
ORAL | Status: AC
Start: 2015-10-21 — End: 2015-10-21
  Administered 2015-10-21: 40 mL/h
  Filled 2015-10-21: qty 237

## 2015-10-21 MED ORDER — JEVITY 1.2 CAL PO LIQD: 1000.0000 mL | ORAL | Status: DC

## 2015-10-21 MED ORDER — PRO-STAT SUGAR FREE PO LIQD
60.0000 mL | Freq: Two times a day (BID) | ORAL | Status: DC
  Administered 2015-10-21 – 2015-10-26 (×11): 60 mL
  Filled 2015-10-21 (×11): qty 60

## 2015-10-21 MED ORDER — DEXTROSE 5 % IV SOLN
1.0000 g | INTRAVENOUS | Status: DC
  Administered 2015-10-21 – 2015-10-26 (×6): 1 g via INTRAVENOUS
  Filled 2015-10-21 (×6): qty 10

## 2015-10-21 MED ORDER — VANCOMYCIN HCL IN DEXTROSE 1-5 GM/200ML-% IV SOLN
1000.0000 mg | Freq: Two times a day (BID) | INTRAVENOUS | Status: DC
  Administered 2015-10-21: 1000 mg via INTRAVENOUS
  Filled 2015-10-21 (×2): qty 200

## 2015-10-21 MED ORDER — JEVITY 1.5 CAL/FIBER PO LIQD
1000.0000 mL | ORAL | Status: DC
  Administered 2015-10-22 – 2015-10-26 (×3): 1000 mL
  Filled 2015-10-21 (×9): qty 1000

## 2015-10-21 NOTE — Progress Notes (Signed)
ANTIBIOTIC CONSULT NOTE - INITIAL  Pharmacy Consult for vancomycin Indication: wound infection  Allergies  Allergen Reactions  . Amlodipine Swelling  . Penicillins Swelling    Has patient had a PCN reaction causing immediate rash, facial/tongue/throat swelling, SOB or lightheadedness with hypotension: Yes Has tolerated cephalosporins in the past Has patient had a PCN reaction causing severe rash involving mucus membranes or skin necrosis: No Has patient had a PCN reaction that required hospitalization No Has patient had a PCN reaction occurring within the last 10 years: No If all of the above answers are "NO", then may proceed with Cephalosporin use.  . Latex Rash    Unknown   . Other Other (See Comments)    Natural Rubber- Unknown     Patient Measurements: Height:  (160 cm) Weight: 184 lb (83.462 kg) IBW/kg (Calculated) : 52.4  Vital Signs: Temp: 97.6 F (36.4 C) (12/16 0549) Temp Source: Oral (12/16 0549) BP: 150/129 mmHg (12/16 0549) Pulse Rate: 125 (12/16 0549)  Labs:  Recent Labs  10/19/15 0405 10/20/15 0450 10/21/15 0441  WBC 6.4 6.8 6.1  HGB 8.3* 8.4* 7.7*  PLT 152 129* 94*  CREATININE 0.63 0.61 0.55   Estimated Creatinine Clearance: 88 mL/min (by C-G formula based on Cr of 0.55).  Microbiology: Recent Results (from the past 720 hour(s))  Blood culture (routine x 2)     Status: None   Collection Time: 10/12/15  4:00 PM  Result Value Ref Range Status   Specimen Description BLOOD RIGHT ARM  Final   Special Requests BOTTLES DRAWN AEROBIC AND ANAEROBIC 5CC  Final   Culture  Setup Time   Final    GRAM POSITIVE COCCI IN CLUSTERS ANAEROBIC BOTTLE ONLY CRITICAL RESULT CALLED TO, READ BACK BY AND VERIFIED WITH: Erby Pian RN 2006 10/13/15 A BROWNING    Culture   Final    STAPHYLOCOCCUS SPECIES (COAGULASE NEGATIVE) THE SIGNIFICANCE OF ISOLATING THIS ORGANISM FROM A SINGLE SET OF BLOOD CULTURES WHEN MULTIPLE SETS ARE DRAWN IS UNCERTAIN. PLEASE NOTIFY THE  MICROBIOLOGY DEPARTMENT WITHIN ONE WEEK IF SPECIATION AND SENSITIVITIES ARE REQUIRED.    Report Status 10/14/2015 FINAL  Final  Urine culture     Status: None   Collection Time: 10/12/15  4:55 PM  Result Value Ref Range Status   Specimen Description URINE, RANDOM  Final   Special Requests NONE  Final   Culture MULTIPLE SPECIES PRESENT, SUGGEST RECOLLECTION  Final   Report Status 10/13/2015 FINAL  Final  MRSA PCR Screening     Status: None   Collection Time: 10/12/15  9:49 PM  Result Value Ref Range Status   MRSA by PCR NEGATIVE NEGATIVE Final    Comment:        The GeneXpert MRSA Assay (FDA approved for NASAL specimens only), is one component of a comprehensive MRSA colonization surveillance program. It is not intended to diagnose MRSA infection nor to guide or monitor treatment for MRSA infections.   Blood culture (routine x 2)     Status: None   Collection Time: 10/12/15 10:12 PM  Result Value Ref Range Status   Specimen Description BLOOD CENTRAL LINE  Final   Special Requests BOTTLES DRAWN AEROBIC AND ANAEROBIC 10CC  Final   Culture NO GROWTH 5 DAYS  Final   Report Status 10/17/2015 FINAL  Final  Wound culture     Status: None   Collection Time: 10/13/15  5:55 AM  Result Value Ref Range Status   Specimen Description WOUND  Final  Special Requests WOUND SACRUM  Final   Gram Stain   Final    FEW WBC PRESENT,BOTH PMN AND MONONUCLEAR NO SQUAMOUS EPITHELIAL CELLS SEEN NO ORGANISMS SEEN Performed at [facility]    Culture   Final    MODERATE METHICILLIN RESISTANT STAPHYLOCOCCUS AUREUS Note: RIFAMPIN AND GENTAMICIN SHOULD NOT BE USED AS SINGLE DRUGS FOR TREATMENT OF STAPH INFECTIONS. CRITICAL RESULT CALLED TO, READ BACK BY AND VERIFIED WITH: KELLY DUFFY 10/17/15 1012 BY SMITHERSJ Performed at [facility]    Report Status 10/17/2015 FINAL  Final   Organism ID, Bacteria METHICILLIN RESISTANT STAPHYLOCOCCUS AUREUS  Final      Susceptibility   Methicillin  resistant staphylococcus aureus - MIC*    CLINDAMYCIN >=8 RESISTANT Resistant     ERYTHROMYCIN >=8 RESISTANT Resistant     GENTAMICIN <=0.5 SENSITIVE Sensitive     LEVOFLOXACIN >=8 RESISTANT Resistant     OXACILLIN >=4 RESISTANT Resistant     RIFAMPIN <=0.5 SENSITIVE Sensitive     TRIMETH/SULFA <=10 SENSITIVE Sensitive     VANCOMYCIN 1 SENSITIVE Sensitive     TETRACYCLINE <=1 SENSITIVE Sensitive     * MODERATE METHICILLIN RESISTANT STAPHYLOCOCCUS AUREUS  Urine culture     Status: None   Collection Time: 10/13/15  6:01 PM  Result Value Ref Range Status   Specimen Description URINE, CATHETERIZED  Final   Special Requests NONE  Final   Culture 8,000 COLONIES/mL INSIGNIFICANT GROWTH  Final   Report Status 10/14/2015 FINAL  Final    Medical History: Past Medical History  Diagnosis Date  . Hypertension   . SAH (subarachnoid hemorrhage) (HCC)   . Seizure (HCC)   . Stroke (HCC)   . Tracheostomy status (HCC)   . Tracheomalacia   . DVT (deep venous thrombosis) (HCC)     Medications:  Prescriptions prior to admission  Medication Sig Dispense Refill Last Dose  . acetaminophen (TYLENOL) 325 MG tablet Take 2 tablets (650 mg total) by mouth daily. (Patient taking differently: Take 650 mg by mouth daily as needed for moderate pain or fever. 2 tablets given every day, may take additional tablets PRN for fever above 100 F)   10/12/2015 at Unknown time  . Amino Acids-Protein Hydrolys (FEEDING SUPPLEMENT, PRO-STAT SUGAR FREE 64,) LIQD Take 30 mLs by mouth daily.   10/12/2015 at Unknown time  . Ascorbic Acid (VITAMIN C) 1000 MG tablet Take 1,000 mg by mouth daily.   10/12/2015 at Unknown time  . carboxymethylcellulose (REFRESH PLUS) 0.5 % SOLN Place 2 drops into both eyes 3 (three) times daily.   10/12/2015 at Unknown time  . Cholecalciferol (VITAMIN D3) 5000 UNITS TABS Take 5,000 Units by mouth daily with breakfast.    10/12/2015 at Unknown time  . cholestyramine light (PREVALITE) 4 G packet Take 4 g  by mouth 3 (three) times daily. mix in 8 oz. Of Liquid   10/12/2015 at Unknown time  . cloNIDine (CATAPRES - DOSED IN MG/24 HR) 0.3 mg/24hr patch Place 1 patch (0.3 mg total) onto the skin once a week. 4 patch 0 Past Week at Unknown time  . famotidine (PEPCID) 40 MG/5ML suspension Take 2.5 mLs (20 mg total) by mouth every 12 (twelve) hours. 50 mL 0 10/12/2015 at Unknown time  . feeding supplement, ENSURE ENLIVE, (ENSURE ENLIVE) LIQD Take 237 mLs by mouth 2 (two) times daily between meals. (Patient taking differently: Take 237 mLs by mouth 3 (three) times daily between meals. ) 60 Bottle 0 10/12/2015 at Unknown time  .  ferrous sulfate 300 (60 FE) MG/5ML syrup Take 5 mLs (300 mg total) by mouth 2 (two) times daily with a meal. 150 mL 3 10/12/2015 at Unknown time  . Lacosamide (VIMPAT) 100 MG TABS Take 100 mg by mouth 2 (two) times daily.   10/12/2015 at Unknown time  . levETIRAcetam (KEPPRA) 750 MG tablet Take 1,500 mg by mouth 2 (two) times daily.   10/12/2015 at Unknown time  . lisinopril (PRINIVIL,ZESTRIL) 10 MG tablet Take 10 mg by mouth daily.   10/12/2015 at Unknown time  . loperamide (IMODIUM A-D) 2 MG tablet Take 4 mg by mouth 4 (four) times daily as needed for diarrhea or loose stools.   unknown  . metoprolol (LOPRESSOR) 100 MG tablet Take 1 tablet (100 mg total) by mouth 2 (two) times daily.   10/12/2015 at 900  . mirtazapine (REMERON) 7.5 MG tablet Take 7.5 mg by mouth at bedtime.   10/11/2015 at Unknown time  . Multiple Vitamin (MULTIVITAMIN WITH MINERALS) TABS tablet Take 1 tablet by mouth daily with breakfast.   10/12/2015 at Unknown time  . traZODone (DESYREL) 50 MG tablet Take 25 mg by mouth at bedtime as needed for sleep.    unknown  . collagenase (SANTYL) ointment Apply topically 2 (two) times daily. (Patient not taking: Reported on 10/12/2015) 15 g 0 Not Taking at Unknown time  . lacosamide (VIMPAT) 50 MG TABS tablet Take 2 tablets (100 mg total) by mouth 2 (two) times daily. (Patient not taking:  Reported on 10/12/2015) 60 tablet 0 Not Taking at Unknown time  . potassium chloride SA (K-DUR,KLOR-CON) 20 MEQ tablet Take 2 tablets (40 mEq total) by mouth 2 (two) times daily. (Patient not taking: Reported on 10/12/2015) 2 tablet 0 Not Taking at Unknown time   Scheduled:  . cefTRIAXone (ROCEPHIN)  IV  1 g Intravenous Q24H  . famotidine  20 mg Oral BID  . feeding supplement (ENSURE ENLIVE)  237 mL Oral TID BM  . free water  200 mL Oral 3 times per day  . lacosamide  100 mg Oral BID  . levETIRAcetam  1,500 mg Oral BID  . lisinopril  20 mg Oral Daily  . metoprolol tartrate  50 mg Oral BID  . multivitamin with minerals  1 tablet Oral Daily   Infusions:  . dextrose 50 mL/hr at 10/21/15 0230    Assessment: 49yo female admitted 12/7 w/ ARF, had been on ABX earlier this admission w/ C/S of blood cx showing MRSA, now w/ wound infection, to resume IV ABX.  Goal of Therapy:  Vancomycin trough level 15-20 mcg/ml  Plan:  Rec'd vanc 1g IV prior to IR procedure yesterday; will continue with vancomycin 1000mg  IV Q12H and monitor CBC, Cx, levels prn.  Vernard GamblesVeronda Delaynie Stetzer, PharmD, BCPS  10/21/2015,7:25 AM

## 2015-10-21 NOTE — Progress Notes (Signed)
PROGRESS NOTE  [name] ZOX:[id number] DOB: 09/30/1966 DOA: 10/12/2015 PCP: [name], MD  HPI: [name] is a 49 year old female with a past medical history ruptured cerebral aneurysm in 2014, with resultant quadriplegia, nonverbal at baseline, current resident at skilled nursing facility, [facility] department [facility] when she was found by nursing staff to be lethargic on admission she was septic evidence by a pressure of 76/15, temperature of 94.2, respiratory rate of 31. She was admitted to the pulmonary critical care service. She required IV pressors support. She showed clinical improvement and was transferred to the medicine service on 10/15/2015. Hospitalization complicated by failure to thrive, having minimal by mouth intake. Palliative care was consulted family meeting held on 10/18/2015 to determine medical goals of care. Family members expressing wishes for placement of PEG tube for nutritional support. Interventional radiology consulted.  Subjective / 24 H Interval events - non verbal this morning  Assessment/Plan: Active Problems:   Acute encephalopathy   Pressure ulcer   Sepsis (HCC)   UTI (lower urinary tract infection)   Decubitus ulcer of sacral region, stage 4 (HCC)   Acute renal failure (ARF) (HCC)   Septic shock (HCC)   Sacral decubitus ulcer   Septic shock - Evidenced by blood pressure of 76/15, temperature of 94.2, respiratory of 31, requiring IV pressor support. She was initially admitted to the intensive care unit under the care of pulmonary critical care medicine. - unclear source, urine culture negative however she is growing MRSA from her wound.  - Blood cultures drawn on 10/12/2015 growing coag negative staph from 1/2 bottles. Suspect that this reflects contaminant.  - ID consulted, discussed with Dr. Ninetta LightsHatcher, plan for PICC Line and 6 weeks of Vancomycin and Ceftriaxone   Acute kidney injury - She initially  presented with a BUN of 113 and creatinine of 3.15, likely secondary to septic shock. - Creatinine has gradually improved with fluids   Thrombocytopenia - chronic, noted during prior hospitalizations, thought to be due to her antiepileptics - Sepsis contributing - monitor  Question hematochezia - Overnight nursing staff reporting bloody stools, found to be Guaiac negative - FOBT sent again this morning, clinically without blood in her stools nor melena, now positive.  - no overt bleeding, closely monitor her Hb  - I don't think she is actively bleeding, however will continue to monitor  Hypernatremia. - On presentation had a sodium of 163, due to poor po intake, PEG tube placed 12/15 - nutrition consult  History of DVT - Currently not on anticoagulation due to history of GI bleed. - Status post IVC filter placement  Stage IV decubitus ulcer - I evaluated wound, she has an 8 cm x 6 cm sacral decubitus ulcer with exposed bone. There was mild erythema involving margins. No purulent drainage - Patient was evaluated by wound care during this hospitalization, recommended continuing saline moist to moist dressings, adding prevalon boots. - Ordered air mattress  - surgery evaluated, no need for further surgical debridement   Hypokalemia - AM labs showing K of 3.2  - replete IV again today   History of HTN - Blood pressures are now elevated with SBP's in the 150's/100's  - She is not taking by mouth, will provide IV antihypertensive agents as needed.  Failure to thrive - She has had minimal PO intake - Workup has not shown a reversible cause of her decline. Labs revealed a white count within normal limits at 7400. K function  stable, sodium trended down to 154 from 157. She has been afebrile. Her sats are upper 90s on room air.  - Palliative care was consulted for facilitation indeterminate medical goals of care. Family meeting was held on 10/18/2015. - Family members would like PEG  tube placement for nutritional support, placed 12/15 - consult nutrition    Diet: Diet NPO time specified Except for: Sips with Meds Fluids: D5W DVT Prophylaxis: SCD  Code Status: Full Code Family Communication: no family bedside today, discussed with her sons last night bedside Disposition Plan: TBD  Barriers to discharge: anemia, PICC Line  Consultants:  ID  Palliative   Procedures:  None    Antibiotics  IV vancomycin stopped on 10/16/2015  IV ceftazidime stopped on 10/16/2015  Ceftin started on 10/16/2015   Studies  Ir Gastrostomy Tube Mod Sed  10/20/2015  CLINICAL DATA:  Quadriparesis ,urosepsis, needs enteral feeding support EXAM: PERC PLACEMENT GASTROSTOMY FLUOROSCOPY TIME:  54 seconds TECHNIQUE: The procedure, risks, benefits, and alternatives were explained to the patient. Questions regarding the procedure were encouraged and answered. The patient understands and consents to the procedure. A 5 French angiographic catheter was placed as orogastric tube. The upper abdomen was prepped with Betadine, draped in usual sterile fashion, and infiltrated locally with 1% lidocaine. Intravenous Fentanyl and Versed were administered as conscious sedation during continuous cardiorespiratory monitoring by the radiology RN, with a total moderate sedation time of less than 30 minutes. Stomach was insufflated using air through the orogastric tube. An 72 French sheath needle was advanced percutaneously into the gastric lumen under fluoroscopy. Gas could be aspirated and a small contrast injection confirmed intraluminal spread. The sheath was exchanged over a guidewire for a 9 [place] vascular sheath, through which the snare device was advanced and used to snare a guidewire passed through the orogastric tube. This was withdrawn, and the snare attached to the 20 French pull-through gastrostomy tube, which was advanced antegrade, positioned with the internal bumper securing the anterior gastric  wall to the anterior abdominal wall. Small contrast injection confirms appropriate positioning. The external bumper was applied and the catheter was flushed. COMPLICATIONS: COMPLICATIONS none IMPRESSION: 1. Technically successful 20 French pull-through gastrostomy placement under fluoroscopy. Electronically Signed   By: Corlis Leak M.D.   On: 10/20/2015 16:24    Objective  Filed Vitals:   10/20/15 1556 10/20/15 1620 10/20/15 2154 10/21/15 0549  BP: 115/93 163/99 147/105 150/129  Pulse: 94 78 110 125  Temp:   97.5 F (36.4 C) 97.6 F (36.4 C)  TempSrc:   Oral Oral  Resp: Height:      Weight:    83.462 kg (184 lb)  SpO2: 100%  100% 100%    Intake/Output Summary (Last 24 hours) at 10/21/15 0659 Last data filed at 10/21/15 1610  Gross per 24 hour  Intake 574.17 ml  Output   1050 ml  Net -475.83 ml   Filed Weights   10/19/15 0534 10/20/15 0617 10/21/15 0549  Weight: 82.555 kg (182 lb) 81.194 kg (179 lb) 83.462 kg (184 lb)    Exam:  GENERAL: NAD, non verbal. Can barely track me with her eyes  HEENT: no scleral icterus, PERRL  NECK: supple, no LAD  LUNGS: CTA biL, no wheezing  HEART: RRR without MRG  ABDOMEN: soft, non tender  MSK: 8x6 cm large decub ulcer, bone exposed  EXTREMITIES: no clubbing / cyanosis  NEUROLOGIC: does not follow commands.    Data Reviewed: Basic Metabolic Panel:  Recent Labs Lab 10/17/15 0644 10/18/15 1400 10/19/15 0405 10/20/15 0450 10/21/15 0441  NA 157* 154* 151* 147* 143  K 3.6 3.2* 3.2* 3.2* 3.1*  CL >130* >130* 126* 122* 119*  CO2 16* 18* 19* 18* 19*  GLUCOSE 79 114* 99 100* 102*  BUN 32* 20 16 13 11   CREATININE 0.91 0.70 0.63 0.61 0.55  CALCIUM 8.8* 8.8* 8.4* 8.6* 8.3*   CBC:  Recent Labs Lab 10/15/15 0347  10/17/15 0644 10/18/15 1400 10/19/15 0405 10/20/15 0450 10/21/15 0441  WBC 9.7  < > 9.4 7.4 6.4 6.8 6.1  NEUTROABS 4.7  --   --   --   --   --   --   HGB 8.5*  < > 8.8* 8.9* 8.3* 8.4* 7.7*  HCT  26.3*  < > 27.0* 27.8* 25.6* 25.5* 22.7*  MCV 87.7  < > 88.5 88.0 88.0 88.2 87.3  PLT 251  < > 247 192 152 129* 94*  < > = values in this interval not displayed.  BNP (last 3 results)  Recent Labs  03/18/15 1240 08/29/15 1305  BNP 69.7 47.2   CBG:  Recent Labs Lab 10/20/15 1619  GLUCAP 124*    Recent Results (from the past 240 hour(s))  Blood culture (routine x 2)     Status: None   Collection Time: 10/12/15  4:00 PM  Result Value Ref Range Status   Specimen Description BLOOD RIGHT ARM  Final   Special Requests BOTTLES DRAWN AEROBIC AND ANAEROBIC 5CC  Final   Culture  Setup Time   Final    GRAM POSITIVE COCCI IN CLUSTERS ANAEROBIC BOTTLE ONLY CRITICAL RESULT CALLED TO, READ BACK BY AND VERIFIED WITH: Erby Pian RN 2006 10/13/15 A BROWNING    Culture   Final    STAPHYLOCOCCUS SPECIES (COAGULASE NEGATIVE) THE SIGNIFICANCE OF ISOLATING THIS ORGANISM FROM A SINGLE SET OF BLOOD CULTURES WHEN MULTIPLE SETS ARE DRAWN IS UNCERTAIN. PLEASE NOTIFY THE MICROBIOLOGY DEPARTMENT WITHIN ONE WEEK IF SPECIATION AND SENSITIVITIES ARE REQUIRED.    Report Status 10/14/2015 FINAL  Final  Urine culture     Status: None   Collection Time: 10/12/15  4:55 PM  Result Value Ref Range Status   Specimen Description URINE, RANDOM  Final   Special Requests NONE  Final   Culture MULTIPLE SPECIES PRESENT, SUGGEST RECOLLECTION  Final   Report Status 10/13/2015 FINAL  Final  MRSA PCR Screening     Status: None   Collection Time: 10/12/15  9:49 PM  Result Value Ref Range Status   MRSA by PCR NEGATIVE NEGATIVE Final    Comment:        The GeneXpert MRSA Assay (FDA approved for NASAL specimens only), is one component of a comprehensive MRSA colonization surveillance program. It is not intended to diagnose MRSA infection nor to guide or monitor treatment for MRSA infections.   Blood culture (routine x 2)     Status: None   Collection Time: 10/12/15 10:12 PM  Result Value Ref Range Status    Specimen Description BLOOD CENTRAL LINE  Final   Special Requests BOTTLES DRAWN AEROBIC AND ANAEROBIC 10CC  Final   Culture NO GROWTH 5 DAYS  Final   Report Status 10/17/2015 FINAL  Final  Wound culture     Status: None   Collection Time: 10/13/15  5:55 AM  Result Value Ref Range Status   Specimen Description WOUND  Final   Special Requests WOUND SACRUM  Final   Gram Stain  Final    FEW WBC PRESENT,BOTH PMN AND MONONUCLEAR NO SQUAMOUS EPITHELIAL CELLS SEEN NO ORGANISMS SEEN Performed at [facility]    Culture   Final    MODERATE METHICILLIN RESISTANT STAPHYLOCOCCUS AUREUS Note: RIFAMPIN AND GENTAMICIN SHOULD NOT BE USED AS SINGLE DRUGS FOR TREATMENT OF STAPH INFECTIONS. CRITICAL RESULT CALLED TO, READ BACK BY AND VERIFIED WITH: KELLY DUFFY 10/17/15 1012 BY SMITHERSJ Performed at [facility]    Report Status 10/17/2015 FINAL  Final   Organism ID, Bacteria METHICILLIN RESISTANT STAPHYLOCOCCUS AUREUS  Final      Susceptibility   Methicillin resistant staphylococcus aureus - MIC*    CLINDAMYCIN >=8 RESISTANT Resistant     ERYTHROMYCIN >=8 RESISTANT Resistant     GENTAMICIN <=0.5 SENSITIVE Sensitive     LEVOFLOXACIN >=8 RESISTANT Resistant     OXACILLIN >=4 RESISTANT Resistant     RIFAMPIN <=0.5 SENSITIVE Sensitive     TRIMETH/SULFA <=10 SENSITIVE Sensitive     VANCOMYCIN 1 SENSITIVE Sensitive     TETRACYCLINE <=1 SENSITIVE Sensitive     * MODERATE METHICILLIN RESISTANT STAPHYLOCOCCUS AUREUS  Urine culture     Status: None   Collection Time: 10/13/15  6:01 PM  Result Value Ref Range Status   Specimen Description URINE, CATHETERIZED  Final   Special Requests NONE  Final   Culture 8,000 COLONIES/mL INSIGNIFICANT GROWTH  Final   Report Status 10/14/2015 FINAL  Final     Scheduled Meds: . cefTRIAXone (ROCEPHIN)  IV  1 g Intravenous Q24H  . famotidine  20 mg Oral BID  . feeding supplement (ENSURE ENLIVE)  237 mL Oral TID BM  . free water  200 mL Oral 3 times  per day  . lacosamide  100 mg Oral BID  . levETIRAcetam  1,500 mg Oral BID  . lisinopril  20 mg Oral Daily  . metoprolol tartrate  50 mg Oral BID  . multivitamin with minerals  1 tablet Oral Daily   Continuous Infusions: . dextrose 50 mL/hr at 10/21/15 0230    [name] Pert, MD [facility] Pager [phone]. If 7 PM - 7 AM, please contact night-coverage at [email or web address], password [facility] 10/21/2015, 6:59 AM  LOS: 9 days

## 2015-10-21 NOTE — Care Management Note (Signed)
Case Management Note  Patient Details  Name: Donald PoreLaura Marie Covelli MRN: [id number] Date of Birth: 10/29/1966  Subjective/Objective:  Plan is for SNF, patient is s/p peg tube, per ID will need picc for 6 weeks of iv abx, patient also with anemia, NCM will cont to follow.                  Action/Plan:   Expected Discharge Date:                  Expected Discharge Plan:  Skilled Nursing Facility  In-House Referral:  Clinical Social Work  Discharge planning Services  CM Consult  Post Acute Care Choice:    Choice offered to:     DME Arranged:    DME Agency:     HH Arranged:    HH Agency:     Status of Service:  Completed, signed off  Medicare Important Message Given:    Date Medicare IM Given:    Medicare IM give by:    Date Additional Medicare IM Given:    Additional Medicare Important Message give by:     If discussed at Long Length of Stay Meetings, dates discussed:    Additional Comments:  [name], Donica Derouin Clinton, RN 10/21/2015, 6:11 PM

## 2015-10-21 NOTE — Progress Notes (Signed)
Pharmacy Antibiotic Follow-up Note  Meghan PoreLaura Marie Lawson is a 49 y.o. year-old female admitted on 10/12/2015 from SNF. She was treated for sepsis upon admission but now on day 2 of ceftriaxone and vancomycin for decubitus ulcer in setting of paraplegia following cerebral aneurysm in 2104, FTT ( PEG tube placed 12/15) and polymicrobial infections.  Assessment/Plan: 1. Based on previous vancomycin dosing and subsequent levels obtained will empirically change vancomycin to 1 gram Q 24 hours 2. Obtain level at SS 3. Continue Ceftriaxone 1 gm Q 24H as previously ordered 4. Noted plans for at least 6 weeks of IV antimicrobials with today considered day 8 based on previous abx's received 5. Following along with you daily; notes prn   Recent Labs Lab 10/17/15 0644 10/18/15 1400 10/19/15 0405 10/20/15 0450 10/21/15 0441  WBC 9.4 7.4 6.4 6.8 6.1    Recent Labs Lab 10/17/15 0644 10/18/15 1400 10/19/15 0405 10/20/15 0450 10/21/15 0441  CREATININE 0.91 0.70 0.63 0.61 0.55   Antimicrobials this admission:  Aztreonam 12/7 x 1  Fluc 12/7 >> 12/9  Levaquin 12/7 >> 12/9  Ceftazidime 12/9 >> 12/11  Ceftin suspn 12/11>>   Ceftriaxone 12/16 >>  Vanc 12/7 >>12/11 >> 12/16 >>  Pertinent levels VT: of 35 on 08/31/15 on 1 gm Q12H and SCr 0.55  Microbiology results: Wound cx 12/8: mod MRSA  Blood cx 12/7: 1/2 CNS  Urine cx 12/7: multiple species; suggest recollect  Urine cx 12/8: insignificant growth  MRSA PCR neg Urine cx 10/23: E.coli R to cipro and macrobid and S to all other agents  Blood: 10/23 P.mirablais R to cipro and S to all other agents   Thank you for allowing pharmacy to be a part of this patient's care.  Pollyann SamplesAndy Tajia Szeliga, PharmD, BCPS 10/21/2015, 12:07 PM Pager: [phone]

## 2015-10-21 NOTE — Progress Notes (Signed)
Referring Physician(s): TRH Gherghe  Chief Complaint:  dysphagia  Subjective:  Percutaneous gastric tube placed 10/20/15 Pt nods "no" to suffering from pain with G tube Needs PICC for antibiotics---scheduled for today   Allergies: Amlodipine; Penicillins; Latex; and Other  Medications: Prior to Admission medications   Medication Sig Start Date End Date Taking? Authorizing Provider  acetaminophen (TYLENOL) 325 MG tablet Take 2 tablets (650 mg total) by mouth daily. Patient taking differently: Take 650 mg by mouth daily as needed for moderate pain or fever. 2 tablets given every day, may take additional tablets PRN for fever above 100 F 03/22/15  Yes Hollice Espy, MD  Amino Acids-Protein Hydrolys (FEEDING SUPPLEMENT, PRO-STAT SUGAR FREE 64,) LIQD Take 30 mLs by mouth daily.   Yes Historical Provider, MD  Ascorbic Acid (VITAMIN C) 1000 MG tablet Take 1,000 mg by mouth daily.   Yes Historical Provider, MD  carboxymethylcellulose (REFRESH PLUS) 0.5 % SOLN Place 2 drops into both eyes 3 (three) times daily.   Yes Historical Provider, MD  Cholecalciferol (VITAMIN D3) 5000 UNITS TABS Take 5,000 Units by mouth daily with breakfast.    Yes Historical Provider, MD  cholestyramine light (PREVALITE) 4 G packet Take 4 g by mouth 3 (three) times daily. mix in 8 oz. Of Liquid   Yes Historical Provider, MD  cloNIDine (CATAPRES - DOSED IN MG/24 HR) 0.3 mg/24hr patch Place 1 patch (0.3 mg total) onto the skin once a week. 06/06/15  Yes Renae Fickle, MD  famotidine (PEPCID) 40 MG/5ML suspension Take 2.5 mLs (20 mg total) by mouth every 12 (twelve) hours. 03/22/15  Yes Hollice Espy, MD  feeding supplement, ENSURE ENLIVE, (ENSURE ENLIVE) LIQD Take 237 mLs by mouth 2 (two) times daily between meals. Patient taking differently: Take 237 mLs by mouth 3 (three) times daily between meals.  06/06/15  Yes Renae Fickle, MD  ferrous sulfate 300 (60 FE) MG/5ML syrup Take 5 mLs (300 mg total) by mouth  2 (two) times daily with a meal. 09/06/15  Yes Kathlen Mody, MD  Lacosamide (VIMPAT) 100 MG TABS Take 100 mg by mouth 2 (two) times daily.   Yes Historical Provider, MD  levETIRAcetam (KEPPRA) 750 MG tablet Take 1,500 mg by mouth 2 (two) times daily.   Yes Historical Provider, MD  lisinopril (PRINIVIL,ZESTRIL) 10 MG tablet Take 10 mg by mouth daily.   Yes Historical Provider, MD  loperamide (IMODIUM A-D) 2 MG tablet Take 4 mg by mouth 4 (four) times daily as needed for diarrhea or loose stools.   Yes Historical Provider, MD  metoprolol (LOPRESSOR) 100 MG tablet Take 1 tablet (100 mg total) by mouth 2 (two) times daily. 03/22/15  Yes Hollice Espy, MD  mirtazapine (REMERON) 7.5 MG tablet Take 7.5 mg by mouth at bedtime.   Yes Historical Provider, MD  Multiple Vitamin (MULTIVITAMIN WITH MINERALS) TABS tablet Take 1 tablet by mouth daily with breakfast.   Yes Historical Provider, MD  traZODone (DESYREL) 50 MG tablet Take 25 mg by mouth at bedtime as needed for sleep.    Yes Historical Provider, MD  collagenase (SANTYL) ointment Apply topically 2 (two) times daily. Patient not taking: Reported on 10/12/2015 09/06/15   Kathlen Mody, MD  lacosamide (VIMPAT) 50 MG TABS tablet Take 2 tablets (100 mg total) by mouth 2 (two) times daily. Patient not taking: Reported on 10/12/2015 06/06/15   Renae Fickle, MD  potassium chloride SA (K-DUR,KLOR-CON) 20 MEQ tablet Take 2 tablets (40 mEq total) by  mouth 2 (two) times daily. Patient not taking: Reported on 10/12/2015 09/06/15   Kathlen ModyVijaya Akula, MD     Vital Signs: BP 150/129 mmHg  Pulse 125  Temp(Src) 97.6 F (36.4 C) (Oral)  Resp 18  Ht 5\' 3"  (1.6 m)  Wt 184 lb (83.462 kg)  BMI 32.60 kg/m2  SpO2 100%  Physical Exam  Abdominal: Soft. Bowel sounds are normal.  Site of G tube clean and dry +BS No bleeding No hematoma  Skin: Skin is warm.  Nursing note and vitals reviewed.   Imaging: Ir Gastrostomy Tube Mod Sed  10/20/2015  CLINICAL DATA:   Quadriparesis ,urosepsis, needs enteral feeding support EXAM: PERC PLACEMENT GASTROSTOMY FLUOROSCOPY TIME:  54 seconds TECHNIQUE: The procedure, risks, benefits, and alternatives were explained to the patient. Questions regarding the procedure were encouraged and answered. The patient understands and consents to the procedure. A 5 French angiographic catheter was placed as orogastric tube. The upper abdomen was prepped with Betadine, draped in usual sterile fashion, and infiltrated locally with 1% lidocaine. Intravenous Fentanyl and Versed were administered as conscious sedation during continuous cardiorespiratory monitoring by the radiology RN, with a total moderate sedation time of less than 30 minutes. Stomach was insufflated using air through the orogastric tube. An 7418 French sheath needle was advanced percutaneously into the gastric lumen under fluoroscopy. Gas could be aspirated and a small contrast injection confirmed intraluminal spread. The sheath was exchanged over a guidewire for a 9 [place] vascular sheath, through which the snare device was advanced and used to snare a guidewire passed through the orogastric tube. This was withdrawn, and the snare attached to the 20 French pull-through gastrostomy tube, which was advanced antegrade, positioned with the internal bumper securing the anterior gastric wall to the anterior abdominal wall. Small contrast injection confirms appropriate positioning. The external bumper was applied and the catheter was flushed. COMPLICATIONS: COMPLICATIONS none IMPRESSION: 1. Technically successful 20 French pull-through gastrostomy placement under fluoroscopy. Electronically Signed   By: Corlis Leak  Hassell M.D.   On: 10/20/2015 16:24    Labs:  CBC:  Recent Labs  10/18/15 1400 10/19/15 0405 10/20/15 0450 10/21/15 0441  WBC 7.4 6.4 6.8 6.1  HGB 8.9* 8.3* 8.4* 7.7*  HCT 27.8* 25.6* 25.5* 22.7*  PLT 192 152 129* 94*    COAGS:  Recent Labs  08/30/15 0036 10/19/15 1215   INR 1.47 1.17  APTT 29 26    BMP:  Recent Labs  10/18/15 1400 10/19/15 0405 10/20/15 0450 10/21/15 0441  NA 154* 151* 147* 143  K 3.2* 3.2* 3.2* 3.1*  CL >130* 126* 122* 119*  CO2 18* 19* 18* 19*  GLUCOSE 114* 99 100* 102*  BUN 20 16 13 11   CALCIUM 8.8* 8.4* 8.6* 8.3*  CREATININE 0.70 0.63 0.61 0.55  GFRNONAA >60 >60 >60 >60  GFRAA >60 >60 >60 >60    LIVER FUNCTION TESTS:  Recent Labs  06/01/15 1703 08/28/15 1415 10/12/15 1600 10/12/15 2145  BILITOT 0.7 0.6 0.4 0.3  AST 30 16 25 16   ALT 41 11* 17 19  ALKPHOS 133* 71 72 69  PROT 7.2 7.4 6.3* 5.7*  ALBUMIN 3.6 2.5* 2.3* 2.0*    Assessment and Plan:  May use G tube now  Signed: Margene Cherian A 10/21/2015, 11:15 AM   I spent a total of 15 Minutes at the the patient's bedside AND on the patient's hospital floor or unit, greater than 50% of which was counseling/coordinating care for percutaneous gastric tube

## 2015-10-21 NOTE — Progress Notes (Signed)
INFECTIOUS DISEASE PROGRESS NOTE  ID: [name] is a 49 y.o. female with  Active Problems:   Acute encephalopathy   Pressure ulcer   Sepsis (HCC)   UTI (lower urinary tract infection)   Decubitus ulcer of sacral region, stage 4 (HCC)   Acute renal failure (ARF) (HCC)   Septic shock (HCC)   Sacral decubitus ulcer  Subjective: Awake and alert, no repsonse   Abtx:  Anti-infectives    Start     Dose/Rate Route Frequency Ordered Stop   10/21/15 0800  cefTRIAXone (ROCEPHIN) 1 g in dextrose 5 % 50 mL IVPB     1 g 100 mL/hr over 30 Minutes Intravenous Every 24 hours 10/21/15 0659     10/21/15 0800  vancomycin (VANCOCIN) IVPB 1000 mg/200 mL premix     1,000 mg 200 mL/hr over 60 Minutes Intravenous Every 12 hours 10/21/15 0731     10/20/15 1515  vancomycin (VANCOCIN) IVPB 1000 mg/200 mL premix     1,000 mg 200 mL/hr over 60 Minutes Intravenous To Radiology 10/20/15 1506 10/20/15 1616   10/16/15 1800  vancomycin (VANCOCIN) IVPB 1000 mg/200 mL premix  Status:  Discontinued     1,000 mg 200 mL/hr over 60 Minutes Intravenous Every 12 hours 10/16/15 1340 10/16/15 1435   10/16/15 1530  cefUROXime (CEFTIN) 250 MG/5ML suspension 250 mg  Status:  Discontinued     250 mg Oral Every 12 hours 10/16/15 1437 10/21/15 0659   10/14/15 1700  levofloxacin (LEVAQUIN) IVPB 500 mg  Status:  Discontinued     500 mg 100 mL/hr over 60 Minutes Intravenous Every 48 hours 10/12/15 2125 10/14/15 1136   10/14/15 1200  cefTAZidime (FORTAZ) 2 g in dextrose 5 % 50 mL IVPB  Status:  Discontinued     2 g 100 mL/hr over 30 Minutes Intravenous Every 12 hours 10/14/15 1144 10/16/15 1435   10/14/15 1145  aztreonam (AZACTAM) 1 g in dextrose 5 % 50 mL IVPB  Status:  Discontinued     1 g 100 mL/hr over 30 Minutes Intravenous 3 times per day 10/14/15 1135 10/14/15 1144   10/13/15 1800  vancomycin (VANCOCIN) IVPB 1000 mg/200 mL premix  Status:  Discontinued     1,000 mg 200 mL/hr over 60 Minutes Intravenous  Every 24 hours 10/13/15 1019 10/16/15 1340   10/13/15 0330  aztreonam (AZACTAM) 1 g in dextrose 5 % 50 mL IVPB  Status:  Discontinued     1 g 100 mL/hr over 30 Minutes Intravenous Every 8 hours 10/12/15 1959 10/12/15 2111   10/12/15 2130  fluconazole (DIFLUCAN) IVPB 200 mg  Status:  Discontinued     200 mg 100 mL/hr over 60 Minutes Intravenous Every 24 hours 10/12/15 2102 10/14/15 1201   10/12/15 2115  aztreonam (AZACTAM) 2 g in dextrose 5 % 50 mL IVPB  Status:  Discontinued     2 g 100 mL/hr over 30 Minutes Intravenous  Once 10/12/15 2111 10/12/15 2123   10/12/15 2000  vancomycin (VANCOCIN) 500 mg in sodium chloride 0.9 % 100 mL IVPB     500 mg 100 mL/hr over 60 Minutes Intravenous  Once 10/12/15 1957 10/12/15 2244   10/12/15 1715  levofloxacin (LEVAQUIN) IVPB 750 mg     750 mg 100 mL/hr over 90 Minutes Intravenous  Once 10/12/15 1713 10/12/15 1906   10/12/15 1715  aztreonam (AZACTAM) 2 g in dextrose 5 % 50 mL IVPB     2 g 100 mL/hr over 30 Minutes Intravenous  Once 10/12/15 1713 10/12/15 2008   10/12/15 1715  vancomycin (VANCOCIN) IVPB 1000 mg/200 mL premix     1,000 mg 200 mL/hr over 60 Minutes Intravenous  Once 10/12/15 1713 10/12/15 1906      Medications:  Scheduled: . cefTRIAXone (ROCEPHIN)  IV  1 g Intravenous Q24H  . famotidine  20 mg Oral BID  . feeding supplement (ENSURE ENLIVE)  237 mL Oral TID BM  . free water  200 mL Oral 3 times per day  . lacosamide  100 mg Oral BID  . levETIRAcetam  1,500 mg Oral BID  . lisinopril  20 mg Oral Daily  . metoprolol tartrate  50 mg Oral BID  . multivitamin with minerals  1 tablet Oral Daily  . vancomycin  1,000 mg Intravenous Q12H    Objective: Vital signs in last 24 hours: Temp:  [97.4 F (36.3 C)-97.6 F (36.4 C)] 97.6 F (36.4 C) (12/16 0549) Pulse Rate:  [78-125] 125 (12/16 0549) Resp:  [14-18] 18 (12/16 0549) BP: (115-163)/(93-129) 150/129 mmHg (12/16 0549) SpO2:  [99 %-100 %] 100 % (12/16 0549) Weight:  [83.462 kg  (184 lb)] 83.462 kg (184 lb) (12/16 0549)   General appearance: alert and no distress Resp: clear to auscultation bilaterally Cardio: regular rate and rhythm GI: normal findings: bowel sounds normal and soft, non-tender  Lab Results  Recent Labs  10/20/15 0450 10/21/15 0441  WBC 6.8 6.1  HGB 8.4* 7.7*  HCT 25.5* 22.7*  NA 147* 143  K 3.2* 3.1*  CL 122* 119*  CO2 18* 19*  BUN 13 11  CREATININE 0.61 0.55   Liver Panel No results for input(s): PROT, ALBUMIN, AST, ALT, ALKPHOS, BILITOT, BILIDIR, IBILI in the last 72 hours. Sedimentation Rate  Recent Labs  10/19/15 1205  ESRSEDRATE 38*   C-Reactive Protein  Recent Labs  10/19/15 1205  CRP 1.6*    Microbiology: Recent Results (from the past 240 hour(s))  Blood culture (routine x 2)     Status: None   Collection Time: 10/12/15  4:00 PM  Result Value Ref Range Status   Specimen Description BLOOD RIGHT ARM  Final   Special Requests BOTTLES DRAWN AEROBIC AND ANAEROBIC 5CC  Final   Culture  Setup Time   Final    GRAM POSITIVE COCCI IN CLUSTERS ANAEROBIC BOTTLE ONLY CRITICAL RESULT CALLED TO, READ BACK BY AND VERIFIED WITH: Erby Pian RN 2006 10/13/15 A BROWNING    Culture   Final    STAPHYLOCOCCUS SPECIES (COAGULASE NEGATIVE) THE SIGNIFICANCE OF ISOLATING THIS ORGANISM FROM A SINGLE SET OF BLOOD CULTURES WHEN MULTIPLE SETS ARE DRAWN IS UNCERTAIN. PLEASE NOTIFY THE MICROBIOLOGY DEPARTMENT WITHIN ONE WEEK IF SPECIATION AND SENSITIVITIES ARE REQUIRED.    Report Status 10/14/2015 FINAL  Final  Urine culture     Status: None   Collection Time: 10/12/15  4:55 PM  Result Value Ref Range Status   Specimen Description URINE, RANDOM  Final   Special Requests NONE  Final   Culture MULTIPLE SPECIES PRESENT, SUGGEST RECOLLECTION  Final   Report Status 10/13/2015 FINAL  Final  MRSA PCR Screening     Status: None   Collection Time: 10/12/15  9:49 PM  Result Value Ref Range Status   MRSA by PCR NEGATIVE NEGATIVE Final     Comment:        The GeneXpert MRSA Assay (FDA approved for NASAL specimens only), is one component of a comprehensive MRSA colonization surveillance program. It is not intended to diagnose MRSA  infection nor to guide or monitor treatment for MRSA infections.   Blood culture (routine x 2)     Status: None   Collection Time: 10/12/15 10:12 PM  Result Value Ref Range Status   Specimen Description BLOOD CENTRAL LINE  Final   Special Requests BOTTLES DRAWN AEROBIC AND ANAEROBIC 10CC  Final   Culture NO GROWTH 5 DAYS  Final   Report Status 10/17/2015 FINAL  Final  Wound culture     Status: None   Collection Time: 10/13/15  5:55 AM  Result Value Ref Range Status   Specimen Description WOUND  Final   Special Requests WOUND SACRUM  Final   Gram Stain   Final    FEW WBC PRESENT,BOTH PMN AND MONONUCLEAR NO SQUAMOUS EPITHELIAL CELLS SEEN NO ORGANISMS SEEN Performed at [facility]    Culture   Final    MODERATE METHICILLIN RESISTANT STAPHYLOCOCCUS AUREUS Note: RIFAMPIN AND GENTAMICIN SHOULD NOT BE USED AS SINGLE DRUGS FOR TREATMENT OF STAPH INFECTIONS. CRITICAL RESULT CALLED TO, READ BACK BY AND VERIFIED WITH: KELLY DUFFY 10/17/15 1012 BY SMITHERSJ Performed at [facility]    Report Status 10/17/2015 FINAL  Final   Organism ID, Bacteria METHICILLIN RESISTANT STAPHYLOCOCCUS AUREUS  Final      Susceptibility   Methicillin resistant staphylococcus aureus - MIC*    CLINDAMYCIN >=8 RESISTANT Resistant     ERYTHROMYCIN >=8 RESISTANT Resistant     GENTAMICIN <=0.5 SENSITIVE Sensitive     LEVOFLOXACIN >=8 RESISTANT Resistant     OXACILLIN >=4 RESISTANT Resistant     RIFAMPIN <=0.5 SENSITIVE Sensitive     TRIMETH/SULFA <=10 SENSITIVE Sensitive     VANCOMYCIN 1 SENSITIVE Sensitive     TETRACYCLINE <=1 SENSITIVE Sensitive     * MODERATE METHICILLIN RESISTANT STAPHYLOCOCCUS AUREUS  Urine culture     Status: None   Collection Time: 10/13/15  6:01 PM  Result Value Ref Range  Status   Specimen Description URINE, CATHETERIZED  Final   Special Requests NONE  Final   Culture 8,000 COLONIES/mL INSIGNIFICANT GROWTH  Final   Report Status 10/14/2015 FINAL  Final    Studies/Results: Ir Gastrostomy Tube Mod Sed  10/20/2015  CLINICAL DATA:  Quadriparesis ,urosepsis, needs enteral feeding support EXAM: PERC PLACEMENT GASTROSTOMY FLUOROSCOPY TIME:  54 seconds TECHNIQUE: The procedure, risks, benefits, and alternatives were explained to the patient. Questions regarding the procedure were encouraged and answered. The patient understands and consents to the procedure. A 5 French angiographic catheter was placed as orogastric tube. The upper abdomen was prepped with Betadine, draped in usual sterile fashion, and infiltrated locally with 1% lidocaine. Intravenous Fentanyl and Versed were administered as conscious sedation during continuous cardiorespiratory monitoring by the radiology RN, with a total moderate sedation time of less than 30 minutes. Stomach was insufflated using air through the orogastric tube. An 6618 French sheath needle was advanced percutaneously into the gastric lumen under fluoroscopy. Gas could be aspirated and a small contrast injection confirmed intraluminal spread. The sheath was exchanged over a guidewire for a 9 [place] vascular sheath, through which the snare device was advanced and used to snare a guidewire passed through the orogastric tube. This was withdrawn, and the snare attached to the 20 French pull-through gastrostomy tube, which was advanced antegrade, positioned with the internal bumper securing the anterior gastric wall to the anterior abdominal wall. Small contrast injection confirms appropriate positioning. The external bumper was applied and the catheter was flushed. COMPLICATIONS: COMPLICATIONS none IMPRESSION: 1. Technically successful  20 French pull-through gastrostomy placement under fluoroscopy. Electronically Signed   By: Corlis Leak M.D.   On:  10/20/2015 16:24     Assessment/Plan: Sepsis Resolved.   Decubitus ulcers Osteomyelitis Air flow bed Nutrition eval Wound care Surgery has eval. Discussed with primary- will plan to continue IV for at least 6 weeks.  vanco/ceftriaxone Can f/u with pcp Available as needed.   Total days of antibiotics: 8 vanoc/aztreonam --> vanco/ceftaz --> ceftin (12-11) --> ceftriaxone/vanco         Johny Sax Infectious Diseases (pager) [phone] [email or web address] 10/21/2015, 11:04 AM  LOS: 9 days

## 2015-10-21 NOTE — Progress Notes (Signed)
Nutrition Follow-up  DOCUMENTATION CODES:   Obesity unspecified  INTERVENTION:   -Continue MVI daily -Initiate Jevity 1.5 @ 20 ml/hr via PEG and increase by 10 ml every 12 hours to goal rate of 40 ml/hr.   60 ml Prostat BID.    Tube feeding regimen provides 1840 kcal (100% of needs), 121 grams of protein, and 730 ml of H2O.   -If no IVFs, consider 195 ml free water flush every 6 hours.   NUTRITION DIAGNOSIS:   Increased nutrient needs related to wound healing as evidenced by estimated needs.  Ongoing  GOAL:   Patient will meet greater than or equal to 90% of their needs  Unmet  MONITOR:   PO intake, Diet advancement, Supplement acceptance, Labs, Weight trends, Skin  REASON FOR ASSESSMENT:   Consult Enteral/tube feeding initiation and management  ASSESSMENT:   49 y.o. F who is paraplegic following a ruptured cerebral aneurysm in 2014. Since then, she has lived in SNF [facility]multiple [facility] ones since then, [facility] She is also non-verbal at baseline.She had apparently been in her USOH until 12/7 when she was less arouseable then usual. She was profoundly hypotensive and she was found to have multiple metabolic derangements for which [facility].  Pt underwent PEG placement by IR on 10/20/15. Per IR note on 10/21/15, tube is ok to use. Per imaging note on 10/20/15, placement verified in stomach. RD consulted for tube feeding initiation.   Per RN, pt is awaiting PICC line placement for IV antibiotics.   Pt has been eating very poorly this admission; currently NPO.   Palliative care team continues to follow.   Labs reviewed: K: 3.2.   Diet Order:  Diet NPO time specified Except for: Sips with Meds  Skin:  Wound (see comment) (stg 1 R hip; stg IV sacrum; DTI R & L foot)  Last BM:  10/19/15  Height:   Ht Readings from Last 1 Encounters:  10/12/15 5\' 3"  (1.6 m)    Weight:   Wt  Readings from Last 1 Encounters:  10/21/15 184 lb (83.462 kg)    Ideal Body Weight:  52.3 kg  BMI:  Body mass index is 32.6 kg/(m^2).  Estimated Nutritional Needs:   Kcal:  1700-1900  Protein:  115-130 gm  Fluid:  1.8-2 L  EDUCATION NEEDS:   No education needs identified at this time  [name], RD, LDN, CDE Pager: [phone] After hours Pager: [phone]

## 2015-10-22 ENCOUNTER — Inpatient Hospital Stay (HOSPITAL_COMMUNITY): Payer: 59

## 2015-10-22 DIAGNOSIS — D649 Anemia, unspecified: Secondary | ICD-10-CM

## 2015-10-22 LAB — CBC WITH DIFFERENTIAL/PLATELET
Basophils Absolute: 0 10*3/uL (ref 0.0–0.1)
Basophils Relative: 0 %
EOS ABS: 0.1 10*3/uL (ref 0.0–0.7)
EOS PCT: 2 %
HCT: 21.5 % — ABNORMAL LOW (ref 36.0–46.0)
Hemoglobin: 7 g/dL — ABNORMAL LOW (ref 12.0–15.0)
LYMPHS ABS: 1.5 10*3/uL (ref 0.7–4.0)
LYMPHS PCT: 27 %
MCH: 29.2 pg (ref 26.0–34.0)
MCHC: 32.6 g/dL (ref 30.0–36.0)
MCV: 89.6 fL (ref 78.0–100.0)
MONO ABS: 0.4 10*3/uL (ref 0.1–1.0)
Monocytes Relative: 7 %
Neutro Abs: 3.5 10*3/uL (ref 1.7–7.7)
Neutrophils Relative %: 65 %
PLATELETS: 68 10*3/uL — AB (ref 150–400)
RBC: 2.4 MIL/uL — ABNORMAL LOW (ref 3.87–5.11)
RDW: 22.7 % — AB (ref 11.5–15.5)
WBC: 5.5 10*3/uL (ref 4.0–10.5)

## 2015-10-22 LAB — GLUCOSE, CAPILLARY
GLUCOSE-CAPILLARY: 115 mg/dL — AB (ref 65–99)
GLUCOSE-CAPILLARY: 98 mg/dL (ref 65–99)
Glucose-Capillary: 110 mg/dL — ABNORMAL HIGH (ref 65–99)
Glucose-Capillary: 114 mg/dL — ABNORMAL HIGH (ref 65–99)

## 2015-10-22 LAB — COMPREHENSIVE METABOLIC PANEL
ALBUMIN: 1.7 g/dL — AB (ref 3.5–5.0)
ALT: 14 U/L (ref 14–54)
AST: 15 U/L (ref 15–41)
Alkaline Phosphatase: 89 U/L (ref 38–126)
Anion gap: 6 (ref 5–15)
BILIRUBIN TOTAL: 0.5 mg/dL (ref 0.3–1.2)
BUN: 11 mg/dL (ref 6–20)
CHLORIDE: 117 mmol/L — AB (ref 101–111)
CO2: 19 mmol/L — ABNORMAL LOW (ref 22–32)
CREATININE: 0.54 mg/dL (ref 0.44–1.00)
Calcium: 7.8 mg/dL — ABNORMAL LOW (ref 8.9–10.3)
GFR calc Af Amer: >60 mL/min (ref >60)
GLUCOSE: 108 mg/dL — AB (ref 65–99)
POTASSIUM: 3.3 mmol/L — AB (ref 3.5–5.1)
Sodium: 142 mmol/L (ref 135–145)
Total Protein: 4.5 g/dL — ABNORMAL LOW (ref 6.5–8.1)

## 2015-10-22 LAB — PREPARE RBC (CROSSMATCH)

## 2015-10-22 LAB — C DIFFICILE QUICK SCREEN W PCR REFLEX
C DIFFICILE (CDIFF) INTERP: NEGATIVE
C DIFFICILE (CDIFF) TOXIN: NEGATIVE
C Diff antigen: NEGATIVE

## 2015-10-22 MED ORDER — CETYLPYRIDINIUM CHLORIDE 0.05 % MT LIQD
7.0000 mL | Freq: Two times a day (BID) | OROMUCOSAL | Status: DC
  Administered 2015-10-22 – 2015-10-26 (×7): 7 mL via OROMUCOSAL

## 2015-10-22 MED ORDER — SODIUM CHLORIDE 0.9 % IV SOLN
Freq: Once | INTRAVENOUS | Status: AC
  Administered 2015-10-22: 15:00:00 via INTRAVENOUS

## 2015-10-22 MED ORDER — IOHEXOL 300 MG/ML  SOLN
50.0000 mL | Freq: Once | INTRAMUSCULAR | Status: AC | PRN
  Administered 2015-10-22: 5 mL via INTRAVENOUS

## 2015-10-22 MED ORDER — POTASSIUM CHLORIDE 20 MEQ/15ML (10%) PO SOLN
40.0000 meq | Freq: Once | ORAL | Status: AC
  Administered 2015-10-22: 40 meq
  Filled 2015-10-22: qty 30

## 2015-10-22 MED ORDER — CHLORHEXIDINE GLUCONATE 0.12 % MT SOLN
15.0000 mL | Freq: Two times a day (BID) | OROMUCOSAL | Status: DC
  Administered 2015-10-22 – 2015-10-26 (×8): 15 mL via OROMUCOSAL
  Filled 2015-10-22 (×7): qty 15

## 2015-10-22 MED ORDER — LIDOCAINE HCL 1 % IJ SOLN
INTRAMUSCULAR | Status: AC
  Filled 2015-10-22: qty 20

## 2015-10-22 NOTE — Procedures (Signed)
RUE PICC 40 cm DL SVC RA No comp/EBL

## 2015-10-22 NOTE — Progress Notes (Signed)
PROGRESS NOTE  [name] UJW:[id number] DOB: Mar 22, 1966 DOA: 10/12/2015 PCP: Ruthe Mannan, MD  HPI: [name] is a 49 year old female with a past medical history ruptured cerebral aneurysm in 2014, with resultant quadriplegia, nonverbal at baseline, current resident at skilled nursing facility, transferred to the emergency department at [facility] when she was found by nursing staff to be lethargic on admission she was septic evidence by a pressure of 76/15, temperature of 94.2, respiratory rate of 31. She was admitted to the pulmonary critical care service. She required IV pressors support. She showed clinical improvement and was transferred to the medicine service on 10/15/2015. Hospitalization complicated by failure to thrive, having minimal by mouth intake. Palliative care was consulted family meeting held on 10/18/2015 to determine medical goals of care. Family members expressing wishes for placement of PEG tube for nutritional support. Interventional radiology consulted.  Subjective / 24 H Interval events - non verbal this morning  Assessment/Plan: Active Problems:   Acute encephalopathy   Pressure ulcer   Sepsis (HCC)   UTI (lower urinary tract infection)   Decubitus ulcer of sacral region, stage 4 (HCC)   Acute renal failure (ARF) (HCC)   Septic shock (HCC)   Sacral decubitus ulcer   Thrombocytopenia (HCC)   Septic shock - Evidenced by blood pressure of 76/15, temperature of 94.2, respiratory of 31, requiring IV pressor support. She was initially admitted to the intensive care unit under the care of pulmonary critical care medicine. - unclear source, urine culture negative however she is growing MRSA from her wound so source likely wound.  - Blood cultures drawn on 10/12/2015 growing coag negative staph from 1/2 bottles. Suspect that this reflects contaminant.  - ID consulted, discussed with Dr. Ninetta Lights, plan for PICC Line and 6 weeks of Vancomycin and  Ceftriaxone - PICC line placement per IR today    Acute kidney injury - She initially presented with a BUN of 113 and creatinine of 3.15, likely secondary to septic shock. - Creatinine has gradually improved with fluids   Thrombocytopenia - chronic, noted during prior hospitalizations, thought to be due to her antiepileptics - Sepsis contributing - monitor, continues to decrease, will consider hematology consult if further dropping  Question hematochezia - patient with FOBT negative then FOBT positive - clinically stools are brown without melena or bright red blood - she has oozing from sacral decub which might be contributing - Hb drifting down to 7 - I don't think she is actively bleeding  Loose stools - C diff negative  Anemia - likely multifactorial ?slow GI bleed vs sepsis vs severe illness  - Hb drifting down, Hb 7, transfuse 1 U   Hypernatremia. - On presentation had a sodium of 163, due to poor po intake, PEG tube placed 12/15 - nutrition consult for tube feeding and free water flushes - Na now in normal range, discontinue IVF altogether   History of DVT - Currently not on anticoagulation due to history of GI bleed. - Status post IVC filter placement  Stage IV decubitus ulcer - I evaluated wound, she has an 8 cm x 6 cm sacral decubitus ulcer with exposed bone. There was mild erythema involving margins. No purulent drainage - Patient was evaluated by wound care during this hospitalization, recommended continuing saline moist to moist dressings, adding prevalon boots. - Ordered air mattress  - surgery evaluated, no need for further surgical debridement   Hypokalemia - AM labs showing K of 3.2  - replete via  PEG tube today   History of HTN - Blood pressures elevated, suspect ongoing pain but difficult to assess  Failure to thrive - She has had minimal PO intake - Workup has not shown a reversible cause of her decline. Labs revealed a white count within normal  limits at 7400. K function stable, sodium trended down and now normal. She has been afebrile. Her sats are upper 90s on room air.  - Palliative care was consulted for facilitation indeterminate medical goals of care. Family meeting was held on 10/18/2015. - Family members would like PEG tube placement for nutritional support, placed 12/15 - consult nutrition for PEG feeding, initiated 12/16   Diet: Diet NPO time specified Except for: Sips with Meds Fluids: D5W DVT Prophylaxis: SCD  Code Status: Full Code Family Communication: no family bedside today Disposition Plan: TBD  Barriers to discharge: anemia, PICC Line  Consultants:  ID  Palliative   Procedures:  None    Antibiotics  IV vancomycin stopped on 10/16/2015  IV ceftazidime stopped on 10/16/2015  Ceftin started on 10/16/2015   Studies  Ir Gastrostomy Tube Mod Sed  10/20/2015  CLINICAL DATA:  Quadriparesis ,urosepsis, needs enteral feeding support EXAM: PERC PLACEMENT GASTROSTOMY FLUOROSCOPY TIME:  54 seconds TECHNIQUE: The procedure, risks, benefits, and alternatives were explained to the patient. Questions regarding the procedure were encouraged and answered. The patient understands and consents to the procedure. A 5 French angiographic catheter was placed as orogastric tube. The upper abdomen was prepped with Betadine, draped in usual sterile fashion, and infiltrated locally with 1% lidocaine. Intravenous Fentanyl and Versed were administered as conscious sedation during continuous cardiorespiratory monitoring by the radiology RN, with a total moderate sedation time of less than 30 minutes. Stomach was insufflated using air through the orogastric tube. An 66 French sheath needle was advanced percutaneously into the gastric lumen under fluoroscopy. Gas could be aspirated and a small contrast injection confirmed intraluminal spread. The sheath was exchanged over a guidewire for a 9 [place] vascular sheath, through which the  snare device was advanced and used to snare a guidewire passed through the orogastric tube. This was withdrawn, and the snare attached to the 20 French pull-through gastrostomy tube, which was advanced antegrade, positioned with the internal bumper securing the anterior gastric wall to the anterior abdominal wall. Small contrast injection confirms appropriate positioning. The external bumper was applied and the catheter was flushed. COMPLICATIONS: COMPLICATIONS none IMPRESSION: 1. Technically successful 20 French pull-through gastrostomy placement under fluoroscopy. Electronically Signed   By: Corlis Leak M.D.   On: 10/20/2015 16:24   Ir Fluoro Guide Cv Line Right  10/22/2015  CLINICAL DATA:  Stroke EXAM: RIGHT UPPER EXTREMITY PICC LINE PLACEMENT WITH ULTRASOUND AND FLUOROSCOPIC GUIDANCE FLUOROSCOPY TIME:  4 minutes and 54 seconds PROCEDURE: The patient was advised of the possible risks and complications and agreed to undergo the procedure. The patient was then brought to the angiographic suite for the procedure. The right arm was prepped with chlorhexidine, draped in the usual sterile fashion using maximum barrier technique (cap and mask, sterile gown, sterile gloves, large sterile sheet, hand hygiene and cutaneous antisepsis) and infiltrated locally with 1% Lidocaine. Ultrasound demonstrated patency of the right basilic vein, and this was documented with an image. Under real-time ultrasound guidance, this vein was accessed with a 21 gauge micropuncture needle and image documentation was performed. A 0.018 wire was introduced in to the vein. Over this, a 5 [place] double lumen power PICC was advanced to the lower  SVC/right atrial junction. Fluoroscopy during the procedure and fluoro spot radiograph confirms appropriate catheter position. The catheter was flushed and covered with a sterile dressing. COMPLICATIONS: None LENGTH: 40 cm IMPRESSION: Successful right arm power PICC line placement with ultrasound and  fluoroscopic guidance. The catheter is ready for use. Electronically Signed   By: Jolaine Click M.D.   On: 10/22/2015 14:00   Ir US Guide Vasc Access Right  10/22/2015  CLINICAL DATA:  Stroke EXAM: RIGHT UPPER EXTREMITY PICC LINE PLACEMENT WITH ULTRASOUND AND FLUOROSCOPIC GUIDANCE FLUOROSCOPY TIME:  4 minutes and 54 seconds PROCEDURE: The patient was advised of the possible risks and complications and agreed to undergo the procedure. The patient was then brought to the angiographic suite for the procedure. The right arm was prepped with chlorhexidine, draped in the usual sterile fashion using maximum barrier technique (cap and mask, sterile gown, sterile gloves, large sterile sheet, hand hygiene and cutaneous antisepsis) and infiltrated locally with 1% Lidocaine. Ultrasound demonstrated patency of the right basilic vein, and this was documented with an image. Under real-time ultrasound guidance, this vein was accessed with a 21 gauge micropuncture needle and image documentation was performed. A 0.018 wire was introduced in to the vein. Over this, a 5 [place] double lumen power PICC was advanced to the lower SVC/right atrial junction. Fluoroscopy during the procedure and fluoro spot radiograph confirms appropriate catheter position. The catheter was flushed and covered with a sterile dressing. COMPLICATIONS: None LENGTH: 40 cm IMPRESSION: Successful right arm power PICC line placement with ultrasound and fluoroscopic guidance. The catheter is ready for use. Electronically Signed   By: Jolaine Click M.D.   On: 10/22/2015 14:00    Objective  Filed Vitals:   10/22/15 0328 10/22/15 0552 10/22/15 0939 10/22/15 1415  BP:  143/91 145/88 150/75  Pulse:  113 137 103  Temp:  98.6 F (37 C)  98.3 F (36.8 C)  TempSrc:  Oral  Oral  Resp:  16  18  Height:      Weight: 82.555 kg (182 lb)     SpO2:  100%  100%    Intake/Output Summary (Last 24 hours) at 10/22/15 1505 Last data filed at 10/22/15 1443  Gross per  24 hour  Intake    250 ml  Output   1501 ml  Net  -1251 ml   Filed Weights   10/20/15 0617 10/21/15 0549 10/22/15 0328  Weight: 81.194 kg (179 lb) 83.462 kg (184 lb) 82.555 kg (182 lb)    Exam:  GENERAL: NAD, non verbal. Can barely track me with her eyes  HEENT: no scleral icterus, PERRL  NECK: supple, no LAD  LUNGS: CTA biL, no wheezing  HEART: RRR without MRG  ABDOMEN: soft, non tender  MSK: 8x6 cm large decub ulcer, bone exposed  EXTREMITIES: no clubbing / cyanosis  NEUROLOGIC: does not follow commands.    Data Reviewed: Basic Metabolic Panel:  Recent Labs Lab 10/18/15 1400 10/19/15 0405 10/20/15 0450 10/21/15 0441 10/22/15 0433  NA 154* 151* 147* 143 142  K 3.2* 3.2* 3.2* 3.1* 3.3*  CL >130* 126* 122* 119* 117*  CO2 18* 19* 18* 19* 19*  GLUCOSE 114* 99 100* 102* 108*  BUN 20 16 13 11 11   CREATININE 0.70 0.63 0.61 0.55 0.54  CALCIUM 8.8* 8.4* 8.6* 8.3* 7.8*   CBC:  Recent Labs Lab 10/18/15 1400 10/19/15 0405 10/20/15 0450 10/21/15 0441 10/22/15 0433  WBC 7.4 6.4 6.8 6.1 5.5  NEUTROABS  --   --   --   --  3.5  HGB 8.9* 8.3* 8.4* 7.7* 7.0*  HCT 27.8* 25.6* 25.5* 22.7* 21.5*  MCV 88.0 88.0 88.2 87.3 89.6  PLT 192 152 129* 94* 68*    BNP (last 3 results)  Recent Labs  03/18/15 1240 08/29/15 1305  BNP 69.7 47.2   CBG:  Recent Labs Lab 10/20/15 1619 10/22/15 0830  GLUCAP 124* 115*    Recent Results (from the past 240 hour(s))  Blood culture (routine x 2)     Status: None   Collection Time: 10/12/15  4:00 PM  Result Value Ref Range Status   Specimen Description BLOOD RIGHT ARM  Final   Special Requests BOTTLES DRAWN AEROBIC AND ANAEROBIC 5CC  Final   Culture  Setup Time   Final    GRAM POSITIVE COCCI IN CLUSTERS ANAEROBIC BOTTLE ONLY CRITICAL RESULT CALLED TO, READ BACK BY AND VERIFIED WITHErby Pian RN 2006 10/13/15 A BROWNING    Culture   Final    STAPHYLOCOCCUS SPECIES (COAGULASE NEGATIVE) THE SIGNIFICANCE OF ISOLATING THIS  ORGANISM FROM A SINGLE SET OF BLOOD CULTURES WHEN MULTIPLE SETS ARE DRAWN IS UNCERTAIN. PLEASE NOTIFY THE MICROBIOLOGY DEPARTMENT WITHIN ONE WEEK IF SPECIATION AND SENSITIVITIES ARE REQUIRED.    Report Status 10/14/2015 FINAL  Final  Urine culture     Status: None   Collection Time: 10/12/15  4:55 PM  Result Value Ref Range Status   Specimen Description URINE, RANDOM  Final   Special Requests NONE  Final   Culture MULTIPLE SPECIES PRESENT, SUGGEST RECOLLECTION  Final   Report Status 10/13/2015 FINAL  Final  MRSA PCR Screening     Status: None   Collection Time: 10/12/15  9:49 PM  Result Value Ref Range Status   MRSA by PCR NEGATIVE NEGATIVE Final    Comment:        The GeneXpert MRSA Assay (FDA approved for NASAL specimens only), is one component of a comprehensive MRSA colonization surveillance program. It is not intended to diagnose MRSA infection nor to guide or monitor treatment for MRSA infections.   Blood culture (routine x 2)     Status: None   Collection Time: 10/12/15 10:12 PM  Result Value Ref Range Status   Specimen Description BLOOD CENTRAL LINE  Final   Special Requests BOTTLES DRAWN AEROBIC AND ANAEROBIC 10CC  Final   Culture NO GROWTH 5 DAYS  Final   Report Status 10/17/2015 FINAL  Final  Wound culture     Status: None   Collection Time: 10/13/15  5:55 AM  Result Value Ref Range Status   Specimen Description WOUND  Final   Special Requests WOUND SACRUM  Final   Gram Stain   Final    FEW WBC PRESENT,BOTH PMN AND MONONUCLEAR NO SQUAMOUS EPITHELIAL CELLS SEEN NO ORGANISMS SEEN Performed at [facility]    Culture   Final    MODERATE METHICILLIN RESISTANT STAPHYLOCOCCUS AUREUS Note: RIFAMPIN AND GENTAMICIN SHOULD NOT BE USED AS SINGLE DRUGS FOR TREATMENT OF STAPH INFECTIONS. CRITICAL RESULT CALLED TO, READ BACK BY AND VERIFIED WITH: KELLY DUFFY 10/17/15 1012 BY SMITHERSJ Performed at [facility]    Report Status 10/17/2015 FINAL  Final    Organism ID, Bacteria METHICILLIN RESISTANT STAPHYLOCOCCUS AUREUS  Final      Susceptibility   Methicillin resistant staphylococcus aureus - MIC*    CLINDAMYCIN >=8 RESISTANT Resistant     ERYTHROMYCIN >=8 RESISTANT Resistant     GENTAMICIN <=0.5 SENSITIVE Sensitive     LEVOFLOXACIN >=8 RESISTANT Resistant  OXACILLIN >=4 RESISTANT Resistant     RIFAMPIN <=0.5 SENSITIVE Sensitive     TRIMETH/SULFA <=10 SENSITIVE Sensitive     VANCOMYCIN 1 SENSITIVE Sensitive     TETRACYCLINE <=1 SENSITIVE Sensitive     * MODERATE METHICILLIN RESISTANT STAPHYLOCOCCUS AUREUS  Urine culture     Status: None   Collection Time: 10/13/15  6:01 PM  Result Value Ref Range Status   Specimen Description URINE, CATHETERIZED  Final   Special Requests NONE  Final   Culture 8,000 COLONIES/mL INSIGNIFICANT GROWTH  Final   Report Status 10/14/2015 FINAL  Final  C difficile quick scan w PCR reflex     Status: None   Collection Time: 10/22/15 12:42 PM  Result Value Ref Range Status   C Diff antigen NEGATIVE NEGATIVE Final   C Diff toxin NEGATIVE NEGATIVE Final   C Diff interpretation Negative for toxigenic C. difficile  Final     Scheduled Meds: . sodium chloride   Intravenous Once  . cefTRIAXone (ROCEPHIN)  IV  1 g Intravenous Q24H  . famotidine  20 mg Oral BID  . feeding supplement (PRO-STAT SUGAR FREE 64)  60 mL Per Tube BID  . free water  200 mL Oral 3 times per day  . lacosamide  100 mg Oral BID  . levETIRAcetam  1,500 mg Oral BID  . lidocaine      . lisinopril  20 mg Oral Daily  . metoprolol tartrate  50 mg Oral BID  . multivitamin with minerals  1 tablet Oral Daily  . vancomycin  1,000 mg Intravenous Q24H   Continuous Infusions: . feeding supplement (JEVITY 1.5 CAL/FIBER)      [name] Pertostin Deidre Carino, MD [facility] Pager [phone]. If 7 PM - 7 AM, please contact night-coverage at [email or web address], password [email or web address] 10/22/2015, 3:05 PM  LOS: 10 days

## 2015-10-23 LAB — CBC WITH DIFFERENTIAL/PLATELET
Basophils Absolute: 0 10*3/uL (ref 0.0–0.1)
Basophils Relative: 0 %
EOS PCT: 2 %
Eosinophils Absolute: 0.1 10*3/uL (ref 0.0–0.7)
HCT: 24.3 % — ABNORMAL LOW (ref 36.0–46.0)
Hemoglobin: 8 g/dL — ABNORMAL LOW (ref 12.0–15.0)
LYMPHS ABS: 2.4 10*3/uL (ref 0.7–4.0)
LYMPHS PCT: 30 %
MCH: 29.9 pg (ref 26.0–34.0)
MCHC: 32.9 g/dL (ref 30.0–36.0)
MCV: 90.7 fL (ref 78.0–100.0)
MONO ABS: 0.7 10*3/uL (ref 0.1–1.0)
MONOS PCT: 8 %
Neutro Abs: 4.9 10*3/uL (ref 1.7–7.7)
Neutrophils Relative %: 60 %
PLATELETS: 61 10*3/uL — AB (ref 150–400)
RBC: 2.68 MIL/uL — ABNORMAL LOW (ref 3.87–5.11)
RDW: 22.3 % — AB (ref 11.5–15.5)
WBC: 8.1 10*3/uL (ref 4.0–10.5)

## 2015-10-23 LAB — GLUCOSE, CAPILLARY
GLUCOSE-CAPILLARY: 94 mg/dL (ref 65–99)
Glucose-Capillary: 99 mg/dL (ref 65–99)
Glucose-Capillary: 108 mg/dL — ABNORMAL HIGH (ref 65–99)
Glucose-Capillary: 106 mg/dL — ABNORMAL HIGH (ref 65–99)
Glucose-Capillary: 98 mg/dL (ref 65–99)

## 2015-10-23 LAB — COMPREHENSIVE METABOLIC PANEL
ALBUMIN: 1.7 g/dL — AB (ref 3.5–5.0)
ALT: 14 U/L (ref 14–54)
AST: 17 U/L (ref 15–41)
Alkaline Phosphatase: 94 U/L (ref 38–126)
Anion gap: 4 — ABNORMAL LOW (ref 5–15)
BILIRUBIN TOTAL: 0.5 mg/dL (ref 0.3–1.2)
BUN: 17 mg/dL (ref 6–20)
CHLORIDE: 116 mmol/L — AB (ref 101–111)
CO2: 21 mmol/L — AB (ref 22–32)
Calcium: 8 mg/dL — ABNORMAL LOW (ref 8.9–10.3)
Creatinine, Ser: 0.53 mg/dL (ref 0.44–1.00)
GFR calc Af Amer: >60 mL/min (ref >60)
GFR calc non Af Amer: >60 mL/min (ref >60)
GLUCOSE: 108 mg/dL — AB (ref 65–99)
POTASSIUM: 4 mmol/L (ref 3.5–5.1)
Sodium: 141 mmol/L (ref 135–145)
TOTAL PROTEIN: 4.6 g/dL — AB (ref 6.5–8.1)

## 2015-10-23 NOTE — Progress Notes (Signed)
PROGRESS NOTE  [name] ZOX:[id number] DOB: [birth year] DOA: 10/12/2015 PCP: Ruthe Mannan, MD  HPI: [name] is a 49 year old female with a past medical history ruptured cerebral aneurysm in 2014, with resultant quadriplegia, nonverbal at baseline, current resident at skilled nursing facility, transferred to the emergency department at [facility] when she was found by nursing staff to be lethargic on admission she was septic evidence by a pressure of 76/15, temperature of 94.2, respiratory rate of 31. She was admitted to the pulmonary critical care service. She required IV pressors support. She showed clinical improvement and was transferred to the medicine service on 10/15/2015. Hospitalization complicated by failure to thrive, having minimal by mouth intake. Palliative care was consulted family meeting held on 10/18/2015 to determine medical goals of care. Family members expressing wishes for placement of PEG tube for nutritional support. Interventional radiology consulted.  Subjective / 24 H Interval events - non verbal, tracks me with her eyes  Assessment/Plan: Active Problems:   Acute encephalopathy   Pressure ulcer   Sepsis (HCC)   UTI (lower urinary tract infection)   Decubitus ulcer of sacral region, stage 4 (HCC)   Acute renal failure (ARF) (HCC)   Septic shock (HCC)   Sacral decubitus ulcer   Thrombocytopenia (HCC)   Septic shock - Evidenced by blood pressure of 76/15, temperature of 94.2, respiratory of 31, requiring IV pressor support. She was initially admitted to the intensive care unit under the care of pulmonary critical care medicine. - unclear source, urine culture negative however she is growing MRSA from her wound so source likely wound.  - Blood cultures drawn on 10/12/2015 growing coag negative staph from 1/2 bottles. Suspect that this reflects contaminant.  - ID consulted, discussed with Dr. Ninetta Lights, plan for PICC Line and 6 weeks of Vancomycin  and Ceftriaxone - PICC line placement per IR 12/17  Acute kidney injury - She initially presented with a BUN of 113 and creatinine of 3.15, likely secondary to septic shock. - Creatinine has gradually improved with fluids   Thrombocytopenia - chronic, noted during prior hospitalizations, thought to be due to her antiepileptics - Sepsis contributing - monitor, seems to have stabilized  Question hematochezia - patient with FOBT negative then FOBT positive - clinically stools are brown without melena or bright red blood - she has oozing from sacral decub which might be contributing - Hb drifting down to 7, transfused 1 u pRBC 12/17, stable today  - I don't think she is actively bleeding  Loose stools - C diff negative  Anemia - likely multifactorial ?slow GI bleed vs sepsis vs severe illness  - Hb 8.0 today   Hypernatremia. - On presentation had a sodium of 163, due to poor po intake, PEG tube placed 12/15 - nutrition consult for tube feeding and free water flushes - Na now in normal range, discontinue IVF altogether and allow only   History of DVT - Currently not on anticoagulation due to history of GI bleed. - Status post IVC filter placement  Stage IV decubitus ulcer - I evaluated wound, she has an 8 cm x 6 cm sacral decubitus ulcer with exposed bone. There was mild erythema involving margins. No purulent drainage - Patient was evaluated by wound care during this hospitalization, recommended continuing saline moist to moist dressings, adding prevalon boots. - Ordered air mattress  - surgery evaluated, no need for further surgical debridement   Hypokalemia - AM labs showing K of 3.2  - replete  via PEG tube today   History of HTN - Blood pressures elevated, suspect ongoing pain but difficult to assess  Failure to thrive - She has had minimal PO intake - Workup has not shown a reversible cause of her decline. Labs revealed a white count within normal limits at 7400. K  function stable, sodium trended down and now normal. She has been afebrile. Her sats are upper 90s on room air.  - Palliative care was consulted for facilitation indeterminate medical goals of care. Family meeting was held on 10/18/2015. Continue aggressive care - Family members would like PEG tube placement for nutritional support, placed 12/15 - consult nutrition for PEG feeding, initiated 12/16   Diet: Diet NPO time specified Except for: Sips with Meds Fluids: D5W DVT Prophylaxis: SCD  Code Status: Full Code Family Communication: no family bedside today Disposition Plan: TBD  Barriers to discharge: anemia, PICC Line  Consultants:  ID  Palliative   Procedures:  None    Antibiotics  IV vancomycin stopped on 10/16/2015  IV ceftazidime stopped on 10/16/2015  Ceftin started on 10/16/2015   Studies  Ir Fluoro Guide Cv Line Right  10/22/2015  CLINICAL DATA:  Stroke EXAM: RIGHT UPPER EXTREMITY PICC LINE PLACEMENT WITH ULTRASOUND AND FLUOROSCOPIC GUIDANCE FLUOROSCOPY TIME:  4 minutes and 54 seconds PROCEDURE: The patient was advised of the possible risks and complications and agreed to undergo the procedure. The patient was then brought to the angiographic suite for the procedure. The right arm was prepped with chlorhexidine, draped in the usual sterile fashion using maximum barrier technique (cap and mask, sterile gown, sterile gloves, large sterile sheet, hand hygiene and cutaneous antisepsis) and infiltrated locally with 1% Lidocaine. Ultrasound demonstrated patency of the right basilic vein, and this was documented with an image. Under real-time ultrasound guidance, this vein was accessed with a 21 gauge micropuncture needle and image documentation was performed. A 0.018 wire was introduced in to the vein. Over this, a 5 [place] double lumen power PICC was advanced to the lower SVC/right atrial junction. Fluoroscopy during the procedure and fluoro spot radiograph confirms  appropriate catheter position. The catheter was flushed and covered with a sterile dressing. COMPLICATIONS: None LENGTH: 40 cm IMPRESSION: Successful right arm power PICC line placement with ultrasound and fluoroscopic guidance. The catheter is ready for use. Electronically Signed   By: Jolaine Click M.D.   On: 10/22/2015 14:00   Ir US Guide Vasc Access Right  10/22/2015  CLINICAL DATA:  Stroke EXAM: RIGHT UPPER EXTREMITY PICC LINE PLACEMENT WITH ULTRASOUND AND FLUOROSCOPIC GUIDANCE FLUOROSCOPY TIME:  4 minutes and 54 seconds PROCEDURE: The patient was advised of the possible risks and complications and agreed to undergo the procedure. The patient was then brought to the angiographic suite for the procedure. The right arm was prepped with chlorhexidine, draped in the usual sterile fashion using maximum barrier technique (cap and mask, sterile gown, sterile gloves, large sterile sheet, hand hygiene and cutaneous antisepsis) and infiltrated locally with 1% Lidocaine. Ultrasound demonstrated patency of the right basilic vein, and this was documented with an image. Under real-time ultrasound guidance, this vein was accessed with a 21 gauge micropuncture needle and image documentation was performed. A 0.018 wire was introduced in to the vein. Over this, a 5 [place] double lumen power PICC was advanced to the lower SVC/right atrial junction. Fluoroscopy during the procedure and fluoro spot radiograph confirms appropriate catheter position. The catheter was flushed and covered with a sterile dressing. COMPLICATIONS: None LENGTH: 40  cm IMPRESSION: Successful right arm power PICC line placement with ultrasound and fluoroscopic guidance. The catheter is ready for use. Electronically Signed   By: Jolaine ClickArthur  Hoss M.D.   On: 10/22/2015 14:00    Objective  Filed Vitals:   10/23/15 0408 10/23/15 0948 10/23/15 1200 10/23/15 1318  BP: 123/88 146/83 150/99 113/62  Pulse: 112 113 99 106  Temp: 98.7 F (37.1 C)  99.2 F (37.3  C) 99.5 F (37.5 C)  TempSrc: Oral  Oral Oral  Resp: 18  18 18   Height:      Weight:      SpO2: 100%  100% 99%    Intake/Output Summary (Last 24 hours) at 10/23/15 1418 Last data filed at 10/23/15 0900  Gross per 24 hour  Intake   1264 ml  Output   1570 ml  Net   -306 ml   Filed Weights   10/20/15 0617 10/21/15 0549 10/22/15 0328  Weight: 81.194 kg (179 lb) 83.462 kg (184 lb) 82.555 kg (182 lb)    Exam:  GENERAL: NAD, non verbal. Can barely track me with her eyes  HEENT: no scleral icterus, PERRL  NECK: supple, no LAD  LUNGS: CTA biL, no wheezing  HEART: RRR without MRG  ABDOMEN: soft, non tender  MSK: 8x6 cm large decub ulcer, bone exposed  EXTREMITIES: no clubbing / cyanosis  NEUROLOGIC: does not follow commands.    Data Reviewed: Basic Metabolic Panel:  Recent Labs Lab 10/19/15 0405 10/20/15 0450 10/21/15 0441 10/22/15 0433 10/23/15 0434  NA 151* 147* 143 142 141  K 3.2* 3.2* 3.1* 3.3* 4.0  CL 126* 122* 119* 117* 116*  CO2 19* 18* 19* 19* 21*  GLUCOSE 99 100* 102* 108* 108*  BUN 16 13 11 11 17   CREATININE 0.63 0.61 0.55 0.54 0.53  CALCIUM 8.4* 8.6* 8.3* 7.8* 8.0*   CBC:  Recent Labs Lab 10/19/15 0405 10/20/15 0450 10/21/15 0441 10/22/15 0433 10/23/15 0434  WBC 6.4 6.8 6.1 5.5 8.1  NEUTROABS  --   --   --  3.5 4.9  HGB 8.3* 8.4* 7.7* 7.0* 8.0*  HCT 25.6* 25.5* 22.7* 21.5* 24.3*  MCV 88.0 88.2 87.3 89.6 90.7  PLT 152 129* 94* 68* 61*    BNP (last 3 results)  Recent Labs  03/18/15 1240 08/29/15 1305  BNP 69.7 47.2   CBG:  Recent Labs Lab 10/22/15 2044 10/22/15 2356 10/23/15 0406 10/23/15 0759 10/23/15 1158  GLUCAP 110* 114* 99 108* 106*    Recent Results (from the past 240 hour(s))  Urine culture     Status: None   Collection Time: 10/13/15  6:01 PM  Result Value Ref Range Status   Specimen Description URINE, CATHETERIZED  Final   Special Requests NONE  Final   Culture 8,000 COLONIES/mL INSIGNIFICANT GROWTH   Final   Report Status 10/14/2015 FINAL  Final  C difficile quick scan w PCR reflex     Status: None   Collection Time: 10/22/15 12:42 PM  Result Value Ref Range Status   C Diff antigen NEGATIVE NEGATIVE Final   C Diff toxin NEGATIVE NEGATIVE Final   C Diff interpretation Negative for toxigenic C. difficile  Final    Scheduled Meds: . antiseptic oral rinse  7 mL Mouth Rinse q12n4p  . cefTRIAXone (ROCEPHIN)  IV  1 g Intravenous Q24H  . chlorhexidine  15 mL Mouth Rinse BID  . famotidine  20 mg Oral BID  . feeding supplement (PRO-STAT SUGAR FREE 64)  60 mL  Per Tube BID  . free water  200 mL Oral 3 times per day  . lacosamide  100 mg Oral BID  . levETIRAcetam  1,500 mg Oral BID  . lisinopril  20 mg Oral Daily  . metoprolol tartrate  50 mg Oral BID  . multivitamin with minerals  1 tablet Oral Daily  . vancomycin  1,000 mg Intravenous Q24H   Continuous Infusions: . feeding supplement (JEVITY 1.5 CAL/FIBER) 1,000 mL (10/22/15 1600)    [name] Pert, MD [facility] Pager [phone]. If 7 PM - 7 AM, please contact night-coverage at [email or web address], password [facility] 10/23/2015, 2:18 PM  LOS: 11 days

## 2015-10-24 ENCOUNTER — Encounter (HOSPITAL_BASED_OUTPATIENT_CLINIC_OR_DEPARTMENT_OTHER): Payer: 59

## 2015-10-24 DIAGNOSIS — D62 Acute posthemorrhagic anemia: Secondary | ICD-10-CM

## 2015-10-24 LAB — CBC
HEMATOCRIT: 22.1 % — AB (ref 36.0–46.0)
HEMOGLOBIN: 7.1 g/dL — AB (ref 12.0–15.0)
MCH: 29.7 pg (ref 26.0–34.0)
MCHC: 32.1 g/dL (ref 30.0–36.0)
MCV: 92.5 fL (ref 78.0–100.0)
PLATELETS: 72 10*3/uL — AB (ref 150–400)
RBC: 2.39 MIL/uL — AB (ref 3.87–5.11)
RDW: 22.8 % — AB (ref 11.5–15.5)
WBC: 5.3 10*3/uL (ref 4.0–10.5)

## 2015-10-24 LAB — GLUCOSE, CAPILLARY
GLUCOSE-CAPILLARY: 103 mg/dL — AB (ref 65–99)
GLUCOSE-CAPILLARY: 86 mg/dL (ref 65–99)
Glucose-Capillary: 83 mg/dL (ref 65–99)
Glucose-Capillary: 119 mg/dL — ABNORMAL HIGH (ref 65–99)
Glucose-Capillary: 113 mg/dL — ABNORMAL HIGH (ref 65–99)
Glucose-Capillary: 88 mg/dL (ref 65–99)

## 2015-10-24 LAB — BASIC METABOLIC PANEL
ANION GAP: 5 (ref 5–15)
BUN: 21 mg/dL — ABNORMAL HIGH (ref 6–20)
CALCIUM: 7.8 mg/dL — AB (ref 8.9–10.3)
CHLORIDE: 113 mmol/L — AB (ref 101–111)
CO2: 23 mmol/L (ref 22–32)
Creatinine, Ser: 0.5 mg/dL (ref 0.44–1.00)
GFR calc Af Amer: >60 mL/min (ref >60)
GFR calc non Af Amer: >60 mL/min (ref >60)
GLUCOSE: 104 mg/dL — AB (ref 65–99)
POTASSIUM: 3.3 mmol/L — AB (ref 3.5–5.1)
Sodium: 141 mmol/L (ref 135–145)

## 2015-10-24 LAB — PREPARE RBC (CROSSMATCH)

## 2015-10-24 LAB — VANCOMYCIN, TROUGH: VANCOMYCIN TR: 12 ug/mL (ref 10.0–20.0)

## 2015-10-24 MED ORDER — SODIUM CHLORIDE 0.9 % IV SOLN
Freq: Once | INTRAVENOUS | Status: AC
  Administered 2015-10-24: 11:00:00 via INTRAVENOUS

## 2015-10-24 MED ORDER — VANCOMYCIN HCL 10 G IV SOLR
1250.0000 mg | INTRAVENOUS | Status: DC
  Administered 2015-10-25 – 2015-10-26 (×2): 1250 mg via INTRAVENOUS
  Filled 2015-10-24 (×2): qty 1250

## 2015-10-24 NOTE — Progress Notes (Signed)
Pharmacy Antibiotic Follow-up Note  Meghan PoreLaura Marie Lawson is a 49 y.o. year-old female admitted on 10/12/2015 from SNF. She was treated for sepsis upon admission but now on day 2 of ceftriaxone and vancomycin for decubitus ulcer in setting of paraplegia following cerebral aneurysm in 2104, FTT ( PEG tube placed 12/15) and polymicrobial infections.  Assessment/Plan: Increase Vancomycin to 1250 mg iv Q 24 hours Continue Rocephin 1 gram iv Q 24 hours Plan is for 6 weeks of therapy (12/16 considered Day # 8)    Recent Labs Lab 10/20/15 0450 10/21/15 0441 10/22/15 0433 10/23/15 0434 10/24/15 0633  WBC 6.8 6.1 5.5 8.1 5.3     Recent Labs Lab 10/20/15 0450 10/21/15 0441 10/22/15 0433 10/23/15 0434 10/24/15 0633  CREATININE 0.61 0.55 0.54 0.53 0.50   Antimicrobials this admission:  Aztreonam 12/7 x 1  Fluc 12/7 >> 12/9  Levaquin 12/7 >> 12/9  Ceftazidime 12/9 >> 12/11  Ceftin suspn 12/11>>   Ceftriaxone 12/16 >>  Vanc 12/7 >>12/11 >> 12/16 >>  Pertinent levels VT: of 35 on 08/31/15 on 1 gm Q12H and SCr 0.55 VT: 12 on 1 gram iv Q 24 hours  Microbiology results: Wound cx 12/8: mod MRSA  Blood cx 12/7: 1/2 CNS  Urine cx 12/7: multiple species; suggest recollect  Urine cx 12/8: insignificant growth  MRSA PCR neg Urine cx 10/23: E.coli R to cipro and macrobid and S to all other agents  Blood: 10/23 P.mirablais R to cipro and S to all other agents   Thank you [name], PharmD [phone]  10/24/2015, 11:43 AM

## 2015-10-24 NOTE — Progress Notes (Signed)
Pt to receive 1 unit RBC. Blood Type A+. Blood received from blood bank A-. Telephone call to blood bank to question the negative blood when pt is positive. Per staff in blood bank OK to use the negative blood.

## 2015-10-24 NOTE — Consult Note (Signed)
[facility] CONSULT Reason for consult: drop in hemoglobin and blood around the anal area Referring Physician: [facility]  [name] is an 49 y.o. female.  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 She is a resident at a SNF. She has a PEG for feeding and is nonverbal. She was admitted and found to be septic which is been treated. This is improved. She is failed to thrive. Palliative care has been on board. She has a sacral pressure ulcer which was presumed to be the source of her sepsis. She is positive for  MRSA. She had a DVT during her last admission heparin was started she developed blood in stools and IVC filter was placed in the heparin was stopped. Apparently her G.I. bleeding resolved to know workup was done. She initially was negative for FOB subsequently become positive for FOB and required transfusion. Some bright red blood has been seen around her anal area but the feeling was it may have been coming from her decubitus ulcer. Her white count has been normal recently but her hemoglobin has remained between 7 and 8. She has received a unit of blood. She's thrombocytopenic for unclear reasons. Her history includes history a DVT, subarachnoid hemorrhage due to cerebral aneurysm, seizures, stroke, DVT, she's had previous cholecystectomy and hysterectomy.  Past Medical History  Diagnosis Date  . Hypertension   . SAH (subarachnoid hemorrhage) ([place])   . Seizure ([place])   . Stroke ([place])   . Tracheostomy status ([place])   . Tracheomalacia   . DVT (deep venous thrombosis) [facility])     Past Surgical History  Procedure Laterality Date  . Abdominal hysterectomy    . Cholecystectomy      Family History  Problem Relation Age of Onset  . Heart disease Mother 45    MI    Social History:  reports that she has quit smoking. Her smoking use included Cigarettes. She has never used smokeless tobacco. She reports that  she does not drink alcohol or use illicit drugs.  Allergies:  Allergies  Allergen Reactions  . Amlodipine Swelling  . Penicillins Swelling    Has patient had a PCN reaction causing immediate rash, facial/tongue/throat swelling, SOB or lightheadedness with hypotension: Yes Has tolerated cephalosporins in the past Has patient had a PCN reaction causing severe rash involving mucus membranes or skin necrosis: No Has patient had a PCN reaction that required hospitalization No Has patient had a PCN reaction occurring within the last 10 years: No If all of the above answers are "NO", then may proceed with Cephalosporin use.  . Latex Rash    Unknown   . Other Other (See Comments)    Natural Rubber- Unknown     Medications; Prior to Admission medications   Medication Sig Start Date End Date Taking? Authorizing Provider  acetaminophen (TYLENOL) 325 MG tablet Take 2 tablets (650 mg total) by mouth daily. Patient taking differently: Take 650 mg by mouth daily as needed for moderate pain or fever. 2 tablets given every day, may take additional tablets PRN for fever above 100 F 03/22/15  Yes Annita Brod, MD  Amino Acids-Protein Hydrolys (FEEDING SUPPLEMENT, PRO-STAT SUGAR FREE 64,) LIQD Take 30 mLs by mouth daily.   Yes Historical Provider, MD  Ascorbic Acid (VITAMIN C) 1000 MG tablet Take 1,000 mg by mouth daily.   Yes Historical Provider, MD  carboxymethylcellulose (REFRESH PLUS) 0.5 % SOLN Place 2 drops into both eyes  3 (three) times daily.   Yes Historical Provider, MD  Cholecalciferol (VITAMIN D3) 5000 UNITS TABS Take 5,000 Units by mouth daily with breakfast.    Yes Historical Provider, MD  cholestyramine light (PREVALITE) 4 G packet Take 4 g by mouth 3 (three) times daily. mix in 8 oz. Of Liquid   Yes Historical Provider, MD  cloNIDine (CATAPRES - DOSED IN MG/24 HR) 0.3 mg/24hr patch Place 1 patch (0.3 mg total) onto the skin once a week. 06/06/15  Yes Janece Canterbury, MD  famotidine  (PEPCID) 40 MG/5ML suspension Take 2.5 mLs (20 mg total) by mouth every 12 (twelve) hours. 03/22/15  Yes Annita Brod, MD  feeding supplement, ENSURE ENLIVE, (ENSURE ENLIVE) LIQD Take 237 mLs by mouth 2 (two) times daily between meals. Patient taking differently: Take 237 mLs by mouth 3 (three) times daily between meals.  06/06/15  Yes Janece Canterbury, MD  ferrous sulfate 300 (60 FE) MG/5ML syrup Take 5 mLs (300 mg total) by mouth 2 (two) times daily with a meal. 09/06/15  Yes Hosie Poisson, MD  Lacosamide (VIMPAT) 100 MG TABS Take 100 mg by mouth 2 (two) times daily.   Yes Historical Provider, MD  levETIRAcetam (KEPPRA) 750 MG tablet Take 1,500 mg by mouth 2 (two) times daily.   Yes Historical Provider, MD  lisinopril (PRINIVIL,ZESTRIL) 10 MG tablet Take 10 mg by mouth daily.   Yes Historical Provider, MD  loperamide (IMODIUM A-D) 2 MG tablet Take 4 mg by mouth 4 (four) times daily as needed for diarrhea or loose stools.   Yes Historical Provider, MD  metoprolol (LOPRESSOR) 100 MG tablet Take 1 tablet (100 mg total) by mouth 2 (two) times daily. 03/22/15  Yes Annita Brod, MD  mirtazapine (REMERON) 7.5 MG tablet Take 7.5 mg by mouth at bedtime.   Yes Historical Provider, MD  Multiple Vitamin (MULTIVITAMIN WITH MINERALS) TABS tablet Take 1 tablet by mouth daily with breakfast.   Yes Historical Provider, MD  traZODone (DESYREL) 50 MG tablet Take 25 mg by mouth at bedtime as needed for sleep.    Yes Historical Provider, MD  collagenase (SANTYL) ointment Apply topically 2 (two) times daily. Patient not taking: Reported on 10/12/2015 09/06/15   Hosie Poisson, MD  lacosamide (VIMPAT) 50 MG TABS tablet Take 2 tablets (100 mg total) by mouth 2 (two) times daily. Patient not taking: Reported on 10/12/2015 06/06/15   Janece Canterbury, MD  potassium chloride SA (K-DUR,KLOR-CON) 20 MEQ tablet Take 2 tablets (40 mEq total) by mouth 2 (two) times daily. Patient not taking: Reported on 10/12/2015 09/06/15   Hosie Poisson, MD   . sodium chloride   Intravenous Once  . antiseptic oral rinse  7 mL Mouth Rinse q12n4p  . cefTRIAXone (ROCEPHIN)  IV  1 g Intravenous Q24H  . chlorhexidine  15 mL Mouth Rinse BID  . famotidine  20 mg Oral BID  . feeding supplement (PRO-STAT SUGAR FREE 64)  60 mL Per Tube BID  . free water  200 mL Oral 3 times per day  . lacosamide  100 mg Oral BID  . levETIRAcetam  1,500 mg Oral BID  . lisinopril  20 mg Oral Daily  . metoprolol tartrate  50 mg Oral BID  . multivitamin with minerals  1 tablet Oral Daily  . [START ON 10/25/2015] vancomycin  1,250 mg Intravenous Q24H   PRN Meds acetaminophen, hydrALAZINE, ondansetron (ZOFRAN) IV, sodium chloride Results for orders placed or performed during the hospital encounter of 10/12/15 (  from the past 48 hour(s))  Glucose, capillary     Status: Abnormal   Collection Time: 10/22/15  8:44 PM  Result Value Ref Range   Glucose-Capillary 110 (H) 65 - 99 mg/dL   Comment 1 Notify RN    Comment 2 Document in Chart   Glucose, capillary     Status: Abnormal   Collection Time: 10/22/15 11:56 PM  Result Value Ref Range   Glucose-Capillary 114 (H) 65 - 99 mg/dL   Comment 1 Notify RN    Comment 2 Document in Chart   Glucose, capillary     Status: None   Collection Time: 10/23/15  4:06 AM  Result Value Ref Range   Glucose-Capillary 99 65 - 99 mg/dL  Comprehensive metabolic panel     Status: Abnormal   Collection Time: 10/23/15  4:34 AM  Result Value Ref Range   Sodium 141 135 - 145 mmol/L   Potassium 4.0 3.5 - 5.1 mmol/L    Comment: DELTA CHECK NOTED   Chloride 116 (H) 101 - 111 mmol/L   CO2 21 (L) 22 - 32 mmol/L   Glucose, Bld 108 (H) 65 - 99 mg/dL   BUN 17 6 - 20 mg/dL   Creatinine, Ser 0.53 0.44 - 1.00 mg/dL   Calcium 8.0 (L) 8.9 - 10.3 mg/dL   Total Protein 4.6 (L) 6.5 - 8.1 g/dL   Albumin 1.7 (L) 3.5 - 5.0 g/dL   AST 17 15 - 41 U/L   ALT 14 14 - 54 U/L   Alkaline Phosphatase 94 38 - 126 U/L   Total Bilirubin 0.5 0.3 - 1.2 mg/dL    GFR calc non Af Amer >60 >60 mL/min   GFR calc Af Amer >60 >60 mL/min    Comment: (NOTE) The eGFR has been calculated using the CKD EPI equation. This calculation has not been validated in all clinical situations. eGFR's persistently <60 mL/min signify possible Chronic Kidney Disease.    Anion gap 4 (L) 5 - 15  CBC with Differential/Platelet     Status: Abnormal   Collection Time: 10/23/15  4:34 AM  Result Value Ref Range   WBC 8.1 4.0 - 10.5 K/uL   RBC 2.68 (L) 3.87 - 5.11 MIL/uL   Hemoglobin 8.0 (L) 12.0 - 15.0 g/dL   HCT 24.3 (L) 36.0 - 46.0 %   MCV 90.7 78.0 - 100.0 fL   MCH 29.9 26.0 - 34.0 pg   MCHC 32.9 30.0 - 36.0 g/dL   RDW 22.3 (H) 11.5 - 15.5 %   Platelets 61 (L) 150 - 400 K/uL    Comment: CONSISTENT WITH PREVIOUS RESULT   Neutrophils Relative % 60 %   Neutro Abs 4.9 1.7 - 7.7 K/uL   Lymphocytes Relative 30 %   Lymphs Abs 2.4 0.7 - 4.0 K/uL   Monocytes Relative 8 %   Monocytes Absolute 0.7 0.1 - 1.0 K/uL   Eosinophils Relative 2 %   Eosinophils Absolute 0.1 0.0 - 0.7 K/uL   Basophils Relative 0 %   Basophils Absolute 0.0 0.0 - 0.1 K/uL  Glucose, capillary     Status: Abnormal   Collection Time: 10/23/15  7:59 AM  Result Value Ref Range   Glucose-Capillary 108 (H) 65 - 99 mg/dL  Glucose, capillary     Status: Abnormal   Collection Time: 10/23/15 11:58 AM  Result Value Ref Range   Glucose-Capillary 106 (H) 65 - 99 mg/dL  Glucose, capillary     Status: None   Collection  Time: 10/23/15  4:04 PM  Result Value Ref Range   Glucose-Capillary 98 65 - 99 mg/dL  Glucose, capillary     Status: None   Collection Time: 10/23/15  8:43 PM  Result Value Ref Range   Glucose-Capillary 94 65 - 99 mg/dL   Comment 1 Notify RN    Comment 2 Document in Chart   Glucose, capillary     Status: Abnormal   Collection Time: 10/24/15 12:02 AM  Result Value Ref Range   Glucose-Capillary 103 (H) 65 - 99 mg/dL   Comment 1 Notify RN    Comment 2 Document in Chart   Glucose,  capillary     Status: None   Collection Time: 10/24/15  4:11 AM  Result Value Ref Range   Glucose-Capillary 83 65 - 99 mg/dL   Comment 1 Notify RN    Comment 2 Document in Chart   Basic metabolic panel     Status: Abnormal   Collection Time: 10/24/15  6:33 AM  Result Value Ref Range   Sodium 141 135 - 145 mmol/L   Potassium 3.3 (L) 3.5 - 5.1 mmol/L   Chloride 113 (H) 101 - 111 mmol/L   CO2 23 22 - 32 mmol/L   Glucose, Bld 104 (H) 65 - 99 mg/dL   BUN 21 (H) 6 - 20 mg/dL   Creatinine, Ser 0.50 0.44 - 1.00 mg/dL   Calcium 7.8 (L) 8.9 - 10.3 mg/dL   GFR calc non Af Amer >60 >60 mL/min   GFR calc Af Amer >60 >60 mL/min    Comment: (NOTE) The eGFR has been calculated using the CKD EPI equation. This calculation has not been validated in all clinical situations. eGFR's persistently <60 mL/min signify possible Chronic Kidney Disease.    Anion gap 5 5 - 15  CBC     Status: Abnormal   Collection Time: 10/24/15  6:33 AM  Result Value Ref Range   WBC 5.3 4.0 - 10.5 K/uL   RBC 2.39 (L) 3.87 - 5.11 MIL/uL   Hemoglobin 7.1 (L) 12.0 - 15.0 g/dL   HCT 22.1 (L) 36.0 - 46.0 %   MCV 92.5 78.0 - 100.0 fL   MCH 29.7 26.0 - 34.0 pg   MCHC 32.1 30.0 - 36.0 g/dL   RDW 22.8 (H) 11.5 - 15.5 %   Platelets 72 (L) 150 - 400 K/uL    Comment: CONSISTENT WITH PREVIOUS RESULT  Glucose, capillary     Status: Abnormal   Collection Time: 10/24/15  7:56 AM  Result Value Ref Range   Glucose-Capillary 119 (H) 65 - 99 mg/dL  Vancomycin, trough     Status: None   Collection Time: 10/24/15  9:15 AM  Result Value Ref Range   Vancomycin Tr 12 10.0 - 20.0 ug/mL  Prepare RBC     Status: None   Collection Time: 10/24/15 10:52 AM  Result Value Ref Range   Order Confirmation ORDER PROCESSED BY BLOOD BANK   Glucose, capillary     Status: Abnormal   Collection Time: 10/24/15 12:39 PM  Result Value Ref Range   Glucose-Capillary 113 (H) 65 - 99 mg/dL    No results  found. ROS: unobtainable           Blood pressure 145/112, pulse 98, temperature 99.8 F (37.7 C), temperature source Oral, resp. rate 16, height _0  (1.6 m), weight 82.555 kg (182 lb), SpO2 100 %.  Physical exam:   Generalminimally responsive African-American female  ENT-- nonicteric  Heart-- regular rate and rhythm without murmurs or gallops  Lungs-- clear  Abdomen-- nondistended and soft. PEG tube is in place     Assessment: 1. Anemia/stool positive for FOB. This could be coming from lower G.I. bleed or from her massive sacral decubitus. I agree a sigmoidoscopy would be helpful.  2. Massive sacral decubitus  3. Quadriplegic and nonverbal secondary to subarachnoid hemorrhage due to ruptured cerebral aneurysm  4. PEG tube for feeding   Plan: will proceed tomorrow with sigmoidoscopy after tap water enemas. Have discussed this with son who is agreeable. This should be adequate to eliminate the possibility of significant lower G.I. pathology.    Vivian Okelley JR,Refujio Haymer L 10/24/2015, 5:05 PM   Pager: [phone] If no answer or after hours call [phone]

## 2015-10-24 NOTE — Progress Notes (Signed)
PROGRESS NOTE  Meghan PoreLaura Marie Lawson JYN:[id number] DOB: 07/30/1966 DOA: 10/12/2015 PCP: [name], MD  HPI: Ms Meghan SayreLaura Lawson is a 49 year old female with a past medical history ruptured cerebral aneurysm in 2014, with resultant quadriplegia, nonverbal at baseline, current resident at skilled nursing facility, [facility] when she was found by nursing staff to be lethargic on admission she was septic evidence by a pressure of 76/15, temperature of 94.2, respiratory rate of 31. She was admitted to the pulmonary critical care service. She required IV pressors support. She showed clinical improvement and was transferred to the medicine service on 10/15/2015. Hospitalization complicated by failure to thrive, having minimal by mouth intake. Palliative care was consulted family meeting held on 10/18/2015 to determine medical goals of care. Family members expressing wishes for placement of PEG tube for nutritional support.  Subjective / 24 H Interval events - non verbal, tracks me with her eyes  Assessment/Plan: Active Problems:   Acute encephalopathy   Pressure ulcer   Sepsis (HCC)   UTI (lower urinary tract infection)   Decubitus ulcer of sacral region, stage 4 (HCC)   Acute renal failure (ARF) (HCC)   Septic shock (HCC)   Sacral decubitus ulcer   Thrombocytopenia (HCC)   Septic shock - Evidenced by blood pressure of 76/15, temperature of 94.2, respiratory of 31, requiring IV pressor support. She was initially admitted to the intensive care unit under the care of pulmonary critical care medicine. - unclear source, urine culture negative however she is growing MRSA from her wound so source likely wound.  - Blood cultures drawn on 10/12/2015 growing coag negative staph from 1/2 bottles. Suspect that this reflects contaminant.  - ID consulted, discussed with Dr. Ninetta LightsHatcher, plan for PICC Line and 6 weeks of Vancomycin and Ceftriaxone - PICC line  placement per IR 12/17  Anemia with question of a GI Bleed - patient hospitalized last month and was found to have a DVT however with initiation of heparin she appeared to have blood in her stools and an IVC filter was placed. Her Hb stabilized at that time and family was advised to follow up with GI as an outpatient.  - patient with FOBT negative then FOBT positive - patient with ongoing drop in Hb, FOBT positive, consulted GI today  - additional pRBC transfusion today   Acute kidney injury - She initially presented with a BUN of 113 and creatinine of 3.15, likely secondary to septic shock. - Creatinine has gradually improved with fluids   Thrombocytopenia - chronic, noted during prior hospitalizations, thought to be due to her antiepileptics - Sepsis contributing - monitor, seems to have stabilized and now improving  Loose stools - C diff negative  Hypernatremia. - On presentation had a sodium of 163, due to poor po intake, PEG tube placed 12/15 - nutrition consult for tube feeding and free water flushes - Na now in normal range, discontinue IVF altogether and allow only tube feeding  History of DVT - Currently not on anticoagulation due to history of GI bleed. - Status post IVC filter placement  Stage IV decubitus ulcer - I evaluated wound, she has an 8 cm x 6 cm sacral decubitus ulcer with exposed bone. There was mild erythema involving margins. No purulent drainage - Patient was evaluated by wound care during this hospitalization, recommended continuing saline moist to moist dressings, adding prevalon boots. - Ordered air mattress  - surgery evaluated, no need for further surgical debridement  Hypokalemia - AM labs showing K of 3.2  - replete via PEG tube today   History of HTN - Blood pressures elevated, suspect ongoing pain but difficult to assess  Failure to thrive - She has had minimal PO intake - Workup has not shown a reversible cause of her decline. Labs  revealed a white count within normal limits at 7400. K function stable, sodium trended down and now normal. She has been afebrile. Her sats are upper 90s on room air.  - Palliative care was consulted for facilitation indeterminate medical goals of care. Family meeting was held on 10/18/2015. Continue aggressive care - Family members would like PEG tube placement for nutritional support, placed 12/15 - consult nutrition for PEG feeding, initiated 12/16   Diet: Diet NPO time specified Except for: Sips with Meds Fluids: D5W DVT Prophylaxis: SCD  Code Status: Full Code Family Communication: no family bedside Disposition Plan: TBD  Barriers to discharge: anemia, PICC Line  Consultants:  ID  Palliative   GI  Procedures:  None    Antibiotics  IV vancomycin stopped on 10/16/2015  IV ceftazidime stopped on 10/16/2015  Ceftin started on 10/16/2015   Studies  No results found.  Objective  Filed Vitals:   10/23/15 1318 10/23/15 2110 10/24/15 0514 10/24/15 1143  BP: 113/62 129/82 133/84 139/88  Pulse: 106 118 95 91  Temp: 99.5 F (37.5 C) 98.9 F (37.2 C) 98.3 F (36.8 C) 99.1 F (37.3 C)  TempSrc: Oral Oral Oral Oral  Resp: Height:      Weight:      SpO2: 99% 99% 100% 100%    Intake/Output Summary (Last 24 hours) at 10/24/15 1144 Last data filed at 10/24/15 1007  Gross per 24 hour  Intake     10 ml  Output   1700 ml  Net  -1690 ml   Filed Weights   10/20/15 0617 10/21/15 0549 10/22/15 0328  Weight: 81.194 kg (179 lb) 83.462 kg (184 lb) 82.555 kg (182 lb)    Exam:  GENERAL: NAD, non verbal. Can barely track me with her eyes  HEENT: no scleral icterus, PERRL  NECK: supple, no LAD  LUNGS: CTA biL, no wheezing  HEART: RRR without MRG  ABDOMEN: soft, non tender  MSK: 8x6 cm large decub ulcer, bone exposed  EXTREMITIES: no clubbing / cyanosis  NEUROLOGIC: does not follow commands.    Data Reviewed: Basic Metabolic  Panel:  Recent Labs Lab 10/20/15 0450 10/21/15 0441 10/22/15 0433 10/23/15 0434 10/24/15 0633  NA 147* 143 142 141 141  K 3.2* 3.1* 3.3* 4.0 3.3*  CL 122* 119* 117* 116* 113*  CO2 18* 19* 19* 21* 23  GLUCOSE 100* 102* 108* 108* 104*  BUN 21*  CREATININE 0.61 0.55 0.54 0.53 0.50  CALCIUM 8.6* 8.3* 7.8* 8.0* 7.8*   CBC:  Recent Labs Lab 10/20/15 0450 10/21/15 0441 10/22/15 0433 10/23/15 0434 10/24/15 0633  WBC 6.8 6.1 5.5 8.1 5.3  NEUTROABS  --   --  3.5 4.9  --   HGB 8.4* 7.7* 7.0* 8.0* 7.1*  HCT 25.5* 22.7* 21.5* 24.3* 22.1*  MCV 88.2 87.3 89.6 90.7 92.5  PLT 129* 94* 68* 61* 72*    BNP (last 3 results)  Recent Labs  03/18/15 1240 08/29/15 1305  BNP 69.7 47.2   CBG:  Recent Labs Lab 10/23/15 1604 10/23/15 2043 10/24/15 0002 10/24/15 0411 10/24/15 0756  GLUCAP 98 94 103* 83 119*  Recent Results (from the past 240 hour(s))  C difficile quick scan w PCR reflex     Status: None   Collection Time: 10/22/15 12:42 PM  Result Value Ref Range Status   C Diff antigen NEGATIVE NEGATIVE Final   C Diff toxin NEGATIVE NEGATIVE Final   C Diff interpretation Negative for toxigenic C. difficile  Final    Scheduled Meds: . sodium chloride   Intravenous Once  . antiseptic oral rinse  7 mL Mouth Rinse q12n4p  . cefTRIAXone (ROCEPHIN)  IV  1 g Intravenous Q24H  . chlorhexidine  15 mL Mouth Rinse BID  . famotidine  20 mg Oral BID  . feeding supplement (PRO-STAT SUGAR FREE 64)  60 mL Per Tube BID  . free water  200 mL Oral 3 times per day  . lacosamide  100 mg Oral BID  . levETIRAcetam  1,500 mg Oral BID  . lisinopril  20 mg Oral Daily  . metoprolol tartrate  50 mg Oral BID  . multivitamin with minerals  1 tablet Oral Daily  . [START ON 10/25/2015] vancomycin  1,250 mg Intravenous Q24H   Continuous Infusions: . feeding supplement (JEVITY 1.5 CAL/FIBER) 1,000 mL (10/22/15 1600)    [name] Pert, MD [facility] Pager [phone]. If 7 PM -  7 AM, please contact night-coverage at [email or web address], password [facility] 10/24/2015, 11:44 AM  LOS: 12 days

## 2015-10-25 ENCOUNTER — Encounter (HOSPITAL_COMMUNITY)
Admission: EM | Disposition: A | Discharge status: Skilled Nursing Facility | Payer: Self-pay | Source: Home / Self Care | Attending: Internal Medicine

## 2015-10-25 ENCOUNTER — Encounter (HOSPITAL_COMMUNITY): Payer: Self-pay

## 2015-10-25 HISTORY — PX: FLEXIBLE SIGMOIDOSCOPY: SHX5431

## 2015-10-25 LAB — BASIC METABOLIC PANEL
ANION GAP: 7 (ref 5–15)
BUN: 20 mg/dL (ref 6–20)
CALCIUM: 8.2 mg/dL — AB (ref 8.9–10.3)
CO2: 24 mmol/L (ref 22–32)
Chloride: 111 mmol/L (ref 101–111)
Creatinine, Ser: 0.4 mg/dL — ABNORMAL LOW (ref 0.44–1.00)
GFR calc Af Amer: >60 mL/min (ref >60)
GLUCOSE: 101 mg/dL — AB (ref 65–99)
Potassium: 3.2 mmol/L — ABNORMAL LOW (ref 3.5–5.1)
SODIUM: 142 mmol/L (ref 135–145)

## 2015-10-25 LAB — CBC
HCT: 26.8 % — ABNORMAL LOW (ref 36.0–46.0)
Hemoglobin: 8.9 g/dL — ABNORMAL LOW (ref 12.0–15.0)
MCH: 30.5 pg (ref 26.0–34.0)
MCHC: 33.2 g/dL (ref 30.0–36.0)
MCV: 91.8 fL (ref 78.0–100.0)
PLATELETS: 74 10*3/uL — AB (ref 150–400)
RBC: 2.92 MIL/uL — ABNORMAL LOW (ref 3.87–5.11)
RDW: 21.5 % — AB (ref 11.5–15.5)
WBC: 5.9 10*3/uL (ref 4.0–10.5)

## 2015-10-25 LAB — TYPE AND SCREEN
ABO/RH(D): A POS
Antibody Screen: NEGATIVE
UNIT DIVISION: 0
UNIT DIVISION: 0

## 2015-10-25 LAB — GLUCOSE, CAPILLARY
GLUCOSE-CAPILLARY: 113 mg/dL — AB (ref 65–99)
GLUCOSE-CAPILLARY: 117 mg/dL — AB (ref 65–99)
GLUCOSE-CAPILLARY: 93 mg/dL (ref 65–99)
Glucose-Capillary: 107 mg/dL — ABNORMAL HIGH (ref 65–99)
Glucose-Capillary: 106 mg/dL — ABNORMAL HIGH (ref 65–99)
Glucose-Capillary: 101 mg/dL — ABNORMAL HIGH (ref 65–99)

## 2015-10-25 SURGERY — SIGMOIDOSCOPY, FLEXIBLE
Anesthesia: Moderate Sedation

## 2015-10-25 MED ORDER — MIDAZOLAM HCL 5 MG/ML IJ SOLN
INTRAMUSCULAR | Status: AC
  Filled 2015-10-25: qty 2

## 2015-10-25 MED ORDER — FENTANYL CITRATE (PF) 100 MCG/2ML IJ SOLN
INTRAMUSCULAR | Status: AC
  Filled 2015-10-25: qty 2

## 2015-10-25 MED ORDER — SODIUM CHLORIDE 0.9 % IV SOLN
INTRAVENOUS | Status: DC
  Administered 2015-10-25: 500 mL via INTRAVENOUS

## 2015-10-25 MED ORDER — POLYETHYLENE GLYCOL 3350 17 G PO PACK
17.0000 g | PACK | Freq: Three times a day (TID) | ORAL | Status: DC
  Administered 2015-10-25 – 2015-10-26 (×2): 17 g via ORAL
  Filled 2015-10-25 (×3): qty 1

## 2015-10-25 NOTE — H&P (View-Only) (Signed)
[facility] Reason for consult: drop in hemoglobin and blood around the anal area Referring Physician: [facility]  [name] is an 49 y.o. female.  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 She is a resident at a SNF. She has a PEG for feeding and is nonverbal. She was admitted and found to be septic which is been treated. This is improved. She is failed to thrive. Palliative care has been on board. She has a sacral pressure ulcer which was presumed to be the source of her sepsis. She is positive for  MRSA. She had a DVT during her last admission heparin was started she developed blood in stools and IVC filter was placed in the heparin was stopped. Apparently her G.I. bleeding resolved to know workup was done. She initially was negative for FOB subsequently become positive for FOB and required transfusion. Some bright red blood has been seen around her anal area but the feeling was it may have been coming from her decubitus ulcer. Her white count has been normal recently but her hemoglobin has remained between 7 and 8. She has received a unit of blood. She's thrombocytopenic for unclear reasons. Her history includes history a DVT, subarachnoid hemorrhage due to cerebral aneurysm, seizures, stroke, DVT, she's had previous cholecystectomy and hysterectomy.  Past Medical History  Diagnosis Date  . Hypertension   . SAH (subarachnoid hemorrhage) ([place])   . Seizure ([place])   . Stroke ([place])   . Tracheostomy status ([place])   . Tracheomalacia   . DVT (deep venous thrombosis) [facility])     Past Surgical History  Procedure Laterality Date  . Abdominal hysterectomy    . Cholecystectomy      Family History  Problem Relation Age of Onset  . Heart disease Mother 45    MI    Social History:  reports that she has quit smoking. Her smoking use included Cigarettes. She has never used smokeless tobacco. She reports that  she does not drink alcohol or use illicit drugs.  Allergies:  Allergies  Allergen Reactions  . Amlodipine Swelling  . Penicillins Swelling    Has patient had a PCN reaction causing immediate rash, facial/tongue/throat swelling, SOB or lightheadedness with hypotension: Yes Has tolerated cephalosporins in the past Has patient had a PCN reaction causing severe rash involving mucus membranes or skin necrosis: No Has patient had a PCN reaction that required hospitalization No Has patient had a PCN reaction occurring within the last 10 years: No If all of the above answers are "NO", then may proceed with Cephalosporin use.  . Latex Rash    Unknown   . Other Other (See Comments)    Natural Rubber- Unknown     Medications; Prior to Admission medications   Medication Sig Start Date End Date Taking? Authorizing Provider  acetaminophen (TYLENOL) 325 MG tablet Take 2 tablets (650 mg total) by mouth daily. Patient taking differently: Take 650 mg by mouth daily as needed for moderate pain or fever. 2 tablets given every day, may take additional tablets PRN for fever above 100 F 03/22/15  Yes Annita Brod, MD  Amino Acids-Protein Hydrolys (FEEDING SUPPLEMENT, PRO-STAT SUGAR FREE 64,) LIQD Take 30 mLs by mouth daily.   Yes Historical Provider, MD  Ascorbic Acid (VITAMIN C) 1000 MG tablet Take 1,000 mg by mouth daily.   Yes Historical Provider, MD  carboxymethylcellulose (REFRESH PLUS) 0.5 % SOLN Place 2 drops into both eyes  3 (three) times daily.   Yes Historical Provider, MD  Cholecalciferol (VITAMIN D3) 5000 UNITS TABS Take 5,000 Units by mouth daily with breakfast.    Yes Historical Provider, MD  cholestyramine light (PREVALITE) 4 G packet Take 4 g by mouth 3 (three) times daily. mix in 8 oz. Of Liquid   Yes Historical Provider, MD  cloNIDine (CATAPRES - DOSED IN MG/24 HR) 0.3 mg/24hr patch Place 1 patch (0.3 mg total) onto the skin once a week. 06/06/15  Yes Janece Canterbury, MD  famotidine  (PEPCID) 40 MG/5ML suspension Take 2.5 mLs (20 mg total) by mouth every 12 (twelve) hours. 03/22/15  Yes Annita Brod, MD  feeding supplement, ENSURE ENLIVE, (ENSURE ENLIVE) LIQD Take 237 mLs by mouth 2 (two) times daily between meals. Patient taking differently: Take 237 mLs by mouth 3 (three) times daily between meals.  06/06/15  Yes Janece Canterbury, MD  ferrous sulfate 300 (60 FE) MG/5ML syrup Take 5 mLs (300 mg total) by mouth 2 (two) times daily with a meal. 09/06/15  Yes Hosie Poisson, MD  Lacosamide (VIMPAT) 100 MG TABS Take 100 mg by mouth 2 (two) times daily.   Yes Historical Provider, MD  levETIRAcetam (KEPPRA) 750 MG tablet Take 1,500 mg by mouth 2 (two) times daily.   Yes Historical Provider, MD  lisinopril (PRINIVIL,ZESTRIL) 10 MG tablet Take 10 mg by mouth daily.   Yes Historical Provider, MD  loperamide (IMODIUM A-D) 2 MG tablet Take 4 mg by mouth 4 (four) times daily as needed for diarrhea or loose stools.   Yes Historical Provider, MD  metoprolol (LOPRESSOR) 100 MG tablet Take 1 tablet (100 mg total) by mouth 2 (two) times daily. 03/22/15  Yes Annita Brod, MD  mirtazapine (REMERON) 7.5 MG tablet Take 7.5 mg by mouth at bedtime.   Yes Historical Provider, MD  Multiple Vitamin (MULTIVITAMIN WITH MINERALS) TABS tablet Take 1 tablet by mouth daily with breakfast.   Yes Historical Provider, MD  traZODone (DESYREL) 50 MG tablet Take 25 mg by mouth at bedtime as needed for sleep.    Yes Historical Provider, MD  collagenase (SANTYL) ointment Apply topically 2 (two) times daily. Patient not taking: Reported on 10/12/2015 09/06/15   Hosie Poisson, MD  lacosamide (VIMPAT) 50 MG TABS tablet Take 2 tablets (100 mg total) by mouth 2 (two) times daily. Patient not taking: Reported on 10/12/2015 06/06/15   Janece Canterbury, MD  potassium chloride SA (K-DUR,KLOR-CON) 20 MEQ tablet Take 2 tablets (40 mEq total) by mouth 2 (two) times daily. Patient not taking: Reported on 10/12/2015 09/06/15   Hosie Poisson, MD   . sodium chloride   Intravenous Once  . antiseptic oral rinse  7 mL Mouth Rinse q12n4p  . cefTRIAXone (ROCEPHIN)  IV  1 g Intravenous Q24H  . chlorhexidine  15 mL Mouth Rinse BID  . famotidine  20 mg Oral BID  . feeding supplement (PRO-STAT SUGAR FREE 64)  60 mL Per Tube BID  . free water  200 mL Oral 3 times per day  . lacosamide  100 mg Oral BID  . levETIRAcetam  1,500 mg Oral BID  . lisinopril  20 mg Oral Daily  . metoprolol tartrate  50 mg Oral BID  . multivitamin with minerals  1 tablet Oral Daily  . [START ON 10/25/2015] vancomycin  1,250 mg Intravenous Q24H   PRN Meds acetaminophen, hydrALAZINE, ondansetron (ZOFRAN) IV, sodium chloride Results for orders placed or performed during the hospital encounter of 10/12/15 (  from the past 48 hour(s))  Glucose, capillary     Status: Abnormal   Collection Time: 10/22/15  8:44 PM  Result Value Ref Range   Glucose-Capillary 110 (H) 65 - 99 mg/dL   Comment 1 Notify RN    Comment 2 Document in Chart   Glucose, capillary     Status: Abnormal   Collection Time: 10/22/15 11:56 PM  Result Value Ref Range   Glucose-Capillary 114 (H) 65 - 99 mg/dL   Comment 1 Notify RN    Comment 2 Document in Chart   Glucose, capillary     Status: None   Collection Time: 10/23/15  4:06 AM  Result Value Ref Range   Glucose-Capillary 99 65 - 99 mg/dL  Comprehensive metabolic panel     Status: Abnormal   Collection Time: 10/23/15  4:34 AM  Result Value Ref Range   Sodium 141 135 - 145 mmol/L   Potassium 4.0 3.5 - 5.1 mmol/L    Comment: DELTA CHECK NOTED   Chloride 116 (H) 101 - 111 mmol/L   CO2 21 (L) 22 - 32 mmol/L   Glucose, Bld 108 (H) 65 - 99 mg/dL   BUN 17 6 - 20 mg/dL   Creatinine, Ser 0.53 0.44 - 1.00 mg/dL   Calcium 8.0 (L) 8.9 - 10.3 mg/dL   Total Protein 4.6 (L) 6.5 - 8.1 g/dL   Albumin 1.7 (L) 3.5 - 5.0 g/dL   AST 17 15 - 41 U/L   ALT 14 14 - 54 U/L   Alkaline Phosphatase 94 38 - 126 U/L   Total Bilirubin 0.5 0.3 - 1.2 mg/dL    GFR calc non Af Amer >60 >60 mL/min   GFR calc Af Amer >60 >60 mL/min    Comment: (NOTE) The eGFR has been calculated using the CKD EPI equation. This calculation has not been validated in all clinical situations. eGFR's persistently <60 mL/min signify possible Chronic Kidney Disease.    Anion gap 4 (L) 5 - 15  CBC with Differential/Platelet     Status: Abnormal   Collection Time: 10/23/15  4:34 AM  Result Value Ref Range   WBC 8.1 4.0 - 10.5 K/uL   RBC 2.68 (L) 3.87 - 5.11 MIL/uL   Hemoglobin 8.0 (L) 12.0 - 15.0 g/dL   HCT 24.3 (L) 36.0 - 46.0 %   MCV 90.7 78.0 - 100.0 fL   MCH 29.9 26.0 - 34.0 pg   MCHC 32.9 30.0 - 36.0 g/dL   RDW 22.3 (H) 11.5 - 15.5 %   Platelets 61 (L) 150 - 400 K/uL    Comment: CONSISTENT WITH PREVIOUS RESULT   Neutrophils Relative % 60 %   Neutro Abs 4.9 1.7 - 7.7 K/uL   Lymphocytes Relative 30 %   Lymphs Abs 2.4 0.7 - 4.0 K/uL   Monocytes Relative 8 %   Monocytes Absolute 0.7 0.1 - 1.0 K/uL   Eosinophils Relative 2 %   Eosinophils Absolute 0.1 0.0 - 0.7 K/uL   Basophils Relative 0 %   Basophils Absolute 0.0 0.0 - 0.1 K/uL  Glucose, capillary     Status: Abnormal   Collection Time: 10/23/15  7:59 AM  Result Value Ref Range   Glucose-Capillary 108 (H) 65 - 99 mg/dL  Glucose, capillary     Status: Abnormal   Collection Time: 10/23/15 11:58 AM  Result Value Ref Range   Glucose-Capillary 106 (H) 65 - 99 mg/dL  Glucose, capillary     Status: None   Collection  Time: 10/23/15  4:04 PM  Result Value Ref Range   Glucose-Capillary 98 65 - 99 mg/dL  Glucose, capillary     Status: None   Collection Time: 10/23/15  8:43 PM  Result Value Ref Range   Glucose-Capillary 94 65 - 99 mg/dL   Comment 1 Notify RN    Comment 2 Document in Chart   Glucose, capillary     Status: Abnormal   Collection Time: 10/24/15 12:02 AM  Result Value Ref Range   Glucose-Capillary 103 (H) 65 - 99 mg/dL   Comment 1 Notify RN    Comment 2 Document in Chart   Glucose,  capillary     Status: None   Collection Time: 10/24/15  4:11 AM  Result Value Ref Range   Glucose-Capillary 83 65 - 99 mg/dL   Comment 1 Notify RN    Comment 2 Document in Chart   Basic metabolic panel     Status: Abnormal   Collection Time: 10/24/15  6:33 AM  Result Value Ref Range   Sodium 141 135 - 145 mmol/L   Potassium 3.3 (L) 3.5 - 5.1 mmol/L   Chloride 113 (H) 101 - 111 mmol/L   CO2 23 22 - 32 mmol/L   Glucose, Bld 104 (H) 65 - 99 mg/dL   BUN 21 (H) 6 - 20 mg/dL   Creatinine, Ser 0.50 0.44 - 1.00 mg/dL   Calcium 7.8 (L) 8.9 - 10.3 mg/dL   GFR calc non Af Amer >60 >60 mL/min   GFR calc Af Amer >60 >60 mL/min    Comment: (NOTE) The eGFR has been calculated using the CKD EPI equation. This calculation has not been validated in all clinical situations. eGFR's persistently <60 mL/min signify possible Chronic Kidney Disease.    Anion gap 5 5 - 15  CBC     Status: Abnormal   Collection Time: 10/24/15  6:33 AM  Result Value Ref Range   WBC 5.3 4.0 - 10.5 K/uL   RBC 2.39 (L) 3.87 - 5.11 MIL/uL   Hemoglobin 7.1 (L) 12.0 - 15.0 g/dL   HCT 22.1 (L) 36.0 - 46.0 %   MCV 92.5 78.0 - 100.0 fL   MCH 29.7 26.0 - 34.0 pg   MCHC 32.1 30.0 - 36.0 g/dL   RDW 22.8 (H) 11.5 - 15.5 %   Platelets 72 (L) 150 - 400 K/uL    Comment: CONSISTENT WITH PREVIOUS RESULT  Glucose, capillary     Status: Abnormal   Collection Time: 10/24/15  7:56 AM  Result Value Ref Range   Glucose-Capillary 119 (H) 65 - 99 mg/dL  Vancomycin, trough     Status: None   Collection Time: 10/24/15  9:15 AM  Result Value Ref Range   Vancomycin Tr 12 10.0 - 20.0 ug/mL  Prepare RBC     Status: None   Collection Time: 10/24/15 10:52 AM  Result Value Ref Range   Order Confirmation ORDER PROCESSED BY BLOOD BANK   Glucose, capillary     Status: Abnormal   Collection Time: 10/24/15 12:39 PM  Result Value Ref Range   Glucose-Capillary 113 (H) 65 - 99 mg/dL    No results  found. ROS: unobtainable           Blood pressure 145/112, pulse 98, temperature 99.8 F (37.7 C), temperature source Oral, resp. rate 16, height _0  (1.6 m), weight 82.555 kg (182 lb), SpO2 100 %.  Physical exam:   Generalminimally responsive African-American female  ENT-- nonicteric  Heart-- regular rate and rhythm without murmurs or gallops  Lungs-- clear  Abdomen-- nondistended and soft. PEG tube is in place     Assessment: 1. Anemia/stool positive for FOB. This could be coming from lower G.I. bleed or from her massive sacral decubitus. I agree a sigmoidoscopy would be helpful.  2. Massive sacral decubitus  3. Quadriplegic and nonverbal secondary to subarachnoid hemorrhage due to ruptured cerebral aneurysm  4. PEG tube for feeding   Plan: will proceed tomorrow with sigmoidoscopy after tap water enemas. Have discussed this with son who is agreeable. This should be adequate to eliminate the possibility of significant lower G.I. pathology.    Manraj Yeo JR,Itzia Cunliffe L 10/24/2015, 5:05 PM   Pager: [phone] If no answer or after hours call [phone]

## 2015-10-25 NOTE — Progress Notes (Signed)
PROGRESS NOTE  [name] ZOX:[id number] DOB: [birth year] DOA: 10/12/2015 PCP: Ruthe Mannan, MD  HPI: [name] is a 49 year old female with a past medical history ruptured cerebral aneurysm in 2014, with resultant quadriplegia, nonverbal at baseline, current resident at skilled nursing facility, transferred to the emergency department at [facility] when she was found by nursing staff to be lethargic on admission she was septic evidence by a pressure of 76/15, temperature of 94.2, respiratory rate of 31. She was admitted to the pulmonary critical care service. She required IV pressors support. She showed clinical improvement and was transferred to the medicine service on 10/15/2015. Hospitalization complicated by failure to thrive, having minimal by mouth intake. Palliative care was consulted family meeting held on 10/18/2015 to determine medical goals of care. Family members expressing wishes for placement of PEG tube for nutritional support. She remained stable however became more thrombocytopenic and more anemic requiring pRBC on 12/17 and 12/19, FOBT initially negative now positive. Consulted GI 12/19, plan for flex sig 12/20.  Subjective / 24 H Interval events - non verbal, tracks me with her eyes  Assessment/Plan: Active Problems:   Acute encephalopathy   Pressure ulcer   Sepsis (HCC)   UTI (lower urinary tract infection)   Decubitus ulcer of sacral region, stage 4 (HCC)   Acute renal failure (ARF) (HCC)   Septic shock (HCC)   Sacral decubitus ulcer   Thrombocytopenia (HCC)   Septic shock - Evidenced by blood pressure of 76/15, temperature of 94.2, respiratory of 31, requiring IV pressor support. She was initially admitted to the intensive care unit under the care of pulmonary critical care medicine. - unclear source, urine culture negative however she is growing MRSA from her wound so source likely wound.  - Blood cultures drawn on 10/12/2015 growing coag negative  staph from 1/2 bottles. Suspect that this reflects contaminant.  - ID consulted, discussed with Dr. Ninetta Lights, plan for PICC Line and 6 weeks of Vancomycin and Ceftriaxone - PICC line placement per IR 12/17. SNF placement when GI issues addressed  Anemia with question of a GI Bleed - patient hospitalized last month and was found to have a DVT however with initiation of heparin she appeared to have blood in her stools and an IVC filter was placed. Her Hb stabilized at that time and family was advised to follow up with GI as an outpatient.  - patient with FOBT negative then FOBT positive - patient with ongoing drop in Hb, FOBT positive, consulted GI  - additional pRBC transfusion 12/19 - flex sig planned for 12/20  Acute kidney injury - She initially presented with a BUN of 113 and creatinine of 3.15, likely secondary to septic shock. - Creatinine has gradually improved with fluids   Thrombocytopenia - chronic, noted during prior hospitalizations, thought to be due to her antiepileptics - Sepsis contributing - monitor, seems to have stabilized and now improving - likely playing a factor in her GI bleeding  Loose stools - C diff negative  Hypernatremia. - On presentation had a sodium of 163, due to poor po intake, PEG tube placed 12/15 - nutrition consult for tube feeding and free water flushes - Na now in normal range, discontinue IVF altogether and allow only tube feeding  History of DVT - Currently not on anticoagulation due to GI bleed. - Status post IVC filter placement  Stage IV decubitus ulcer - I evaluated wound, she has an 8 cm x 6 cm sacral decubitus ulcer with  exposed bone. There was mild erythema involving margins. No purulent drainage - Patient was evaluated by wound care during this hospitalization, recommended continuing saline moist to moist dressings, adding prevalon boots. - Ordered air mattress  - surgery evaluated, no need for further surgical debridement    Hypokalemia - AM labs showing K of 3.2  - replete via PEG tube today   History of HTN - Blood pressures elevated, suspect ongoing pain but difficult to assess  Failure to thrive - She has had minimal PO intake - Workup has not shown a reversible cause of her decline. Labs revealed a white count within normal limits at 7400. K function stable, sodium trended down and now normal. She has been afebrile. Her sats are upper 90s on room air.  - Palliative care was consulted for facilitation indeterminate medical goals of care. Family meeting was held on 10/18/2015. Continue aggressive care - Family members would like PEG tube placement for nutritional support, placed 12/15 - consult nutrition for PEG feeding, initiated 12/16   Diet: Diet NPO time specified Except for: Sips with Meds Fluids: none  DVT Prophylaxis: SCD  Code Status: Full Code Family Communication: no family bedside Disposition Plan: TBD  Barriers to discharge: anemia, PICC Line  Consultants:  ID  Palliative   GI  Procedures:  None    Antibiotics  Vancomycin   Ceftriaxone   Studies  No results found.  Objective  Filed Vitals:   10/25/15 0507 10/25/15 0549 10/25/15 0856 10/25/15 1223  BP: 170/119 138/104 133/95 127/106  Pulse: 101 106 116 102  Temp:    98.6 F (37 C)  TempSrc:    Oral  Resp:    16  Height:      Weight: 82.736 kg (182 lb 6.4 oz)     SpO2:    100%    Intake/Output Summary (Last 24 hours) at 10/25/15 1351 Last data filed at 10/25/15 1044  Gross per 24 hour  Intake 918.83 ml  Output   1250 ml  Net -331.17 ml   Filed Weights   10/21/15 0549 10/22/15 0328 10/25/15 0507  Weight: 83.462 kg (184 lb) 82.555 kg (182 lb) 82.736 kg (182 lb 6.4 oz)    Exam:  GENERAL: NAD, non verbal. Can barely track me with her eyes  HEENT: no scleral icterus, PERRL  NECK: supple, no LAD  LUNGS: CTA biL, no wheezing  HEART: RRR without MRG  ABDOMEN: soft, non tender  MSK: 8x6 cm  large decub ulcer, bone exposed  EXTREMITIES: no clubbing / cyanosis  NEUROLOGIC: does not follow commands.    Data Reviewed: Basic Metabolic Panel:  Recent Labs Lab 10/21/15 0441 10/22/15 0433 10/23/15 0434 10/24/15 0633 10/25/15 0505  NA 143 142 141 141 142  K 3.1* 3.3* 4.0 3.3* 3.2*  CL 119* 117* 116* 113* 111  CO2 19* 19* 21* 23 24  GLUCOSE 102* 108* 108* 104* 101*  BUN 21* 20  CREATININE 0.55 0.54 0.53 0.50 0.40*  CALCIUM 8.3* 7.8* 8.0* 7.8* 8.2*   CBC:  Recent Labs Lab 10/21/15 0441 10/22/15 0433 10/23/15 0434 10/24/15 0633 10/25/15 0505  WBC 6.1 5.5 8.1 5.3 5.9  NEUTROABS  --  3.5 4.9  --   --   HGB 7.7* 7.0* 8.0* 7.1* 8.9*  HCT 22.7* 21.5* 24.3* 22.1* 26.8*  MCV 87.3 89.6 90.7 92.5 91.8  PLT 94* 68* 61* 72* 74*    BNP (last 3 results)  Recent Labs  03/18/15 1240 08/29/15 1305  BNP 69.7 47.2   CBG:  Recent Labs Lab 10/24/15 2033 10/25/15 0028 10/25/15 0446 10/25/15 0805 10/25/15 1220  GLUCAP 88 113* 107* 117* 106*    Recent Results (from the past 240 hour(s))  C difficile quick scan w PCR reflex     Status: None   Collection Time: 10/22/15 12:42 PM  Result Value Ref Range Status   C Diff antigen NEGATIVE NEGATIVE Final   C Diff toxin NEGATIVE NEGATIVE Final   C Diff interpretation Negative for toxigenic C. difficile  Final    Scheduled Meds: . antiseptic oral rinse  7 mL Mouth Rinse q12n4p  . cefTRIAXone (ROCEPHIN)  IV  1 g Intravenous Q24H  . chlorhexidine  15 mL Mouth Rinse BID  . famotidine  20 mg Oral BID  . feeding supplement (PRO-STAT SUGAR FREE 64)  60 mL Per Tube BID  . free water  200 mL Oral 3 times per day  . lacosamide  100 mg Oral BID  . levETIRAcetam  1,500 mg Oral BID  . lisinopril  20 mg Oral Daily  . metoprolol tartrate  50 mg Oral BID  . multivitamin with minerals  1 tablet Oral Daily  . vancomycin  1,250 mg Intravenous Q24H   Continuous Infusions: . feeding supplement (JEVITY 1.5 CAL/FIBER) 1,000  mL (10/25/15 16100909)    [name] Pertostin Zuley Lutter, MD [facility] Pager [phone]. If 7 PM - 7 AM, please contact night-coverage at [email or web address], password [email or web address] 10/25/2015, 1:51 PM  LOS: 13 days

## 2015-10-25 NOTE — Progress Notes (Signed)
Tap water enema was given, about 500 mL water in enema bag was instilled, then stopped because patient unable to hold anymore. Will continue to monitor for bowel movement.

## 2015-10-25 NOTE — Interval H&P Note (Signed)
History and Physical Interval Note:  10/25/2015 2:51 PM  [name]  has presented today for surgery, with the diagnosis of GI bleeding  The various methods of treatment have been discussed with the patient and family. After consideration of risks, benefits and other options for treatment, the patient has consented to  Procedure(s): FLEXIBLE SIGMOIDOSCOPY (N/A) as a surgical intervention .  The patient's history has been reviewed, patient examined, no change in status, stable for surgery.  I have reviewed the patient's chart and labs.  Questions were answered to the patient's satisfaction.     Clancey Welton JR,Madalyne Husk L

## 2015-10-25 NOTE — Op Note (Signed)
[facility] [address] [place], [id number]   FLEXIBLE SIGMOIDOSCOPY PROCEDURE REPORT  PATIENT: [name], [name]  MR#: [id number] BIRTHDATE: Oct 30, 1966 , 49  yrs. old GENDER: female ENDOSCOPIST: Carman ChingJames Furman Trentman, MD REFERRED BY: [facility] PROCEDURE DATE:  10/25/2015 PROCEDURE:   Flexible Sigmoidoscopy with Biopsy ASA CLASS:   class IV INDICATIONS:patient with marked mental impairment following stroke who has sacral decubitus and has had rectal bleeding MEDICATIONS: procedure done on sedated  DESCRIPTION OF PROCEDURE:   After the risks benefits and alternatives of the procedure were thoroughly explained, informed consent was obtained.  digital exam was normal      The pediatric colonoscope       endoscope was introduced through the anus  and advanced to the sigmoid colon      , The exam was Without limitations.    The quality of the prep was poor there was a large amount of solid stool.      . Estimated blood loss is zero unless otherwise noted in this procedure report. The instrument was then slowly withdrawn as the mucosa was fully examined. the patient was found to have marked ulceration of the rectosigmoid that began approximately 20 cm the anal verge. The rectum itself was relatively free of ulceration. There was some solid stool in the rectum and this cannot be completely examined. The transition zone was approximately 18 cm from the anus and it was ulcerated for distance of approximately 10 to 12 cm. There was a clear transition zone above that and we went to approximately 30 to 40 cm and that area was completely free of any ulceration but there was solid stool there.the scope was  withdrawn. The ulcerated area was confluent ulcers and circumferential and was biopsied. The patient tolerated the procedure well. The procedure was done with no sedation.       The scope was then withdrawn from the patient and the procedure  terminated.  COMPLICATIONS: There were no immediate complications.  ENDOSCOPIC IMPRESSION: 1. Ulcerated Rectosigmoid Colon. This is almost certainly a result of ischemic injury from constipation.  RECOMMENDATIONS: we will start the patient on regular Miralax. Once she has had loose stools we can begin to back off on the Miralax until we are able to obtain a consistent soft bowel movement.  REPEAT EXAM:  eSigned:  Carman ChingJames Jennice Renegar, MD 10/25/2015 3:54 PM   CC:  PATIENT NAME:  [name], [name] MR#: [id number]

## 2015-10-26 ENCOUNTER — Encounter (HOSPITAL_COMMUNITY): Payer: Self-pay | Admitting: Gastroenterology

## 2015-10-26 DIAGNOSIS — G9340 Encephalopathy, unspecified: Secondary | ICD-10-CM

## 2015-10-26 LAB — GLUCOSE, CAPILLARY
GLUCOSE-CAPILLARY: 122 mg/dL — AB (ref 65–99)
GLUCOSE-CAPILLARY: 105 mg/dL — AB (ref 65–99)
GLUCOSE-CAPILLARY: 120 mg/dL — AB (ref 65–99)
Glucose-Capillary: 117 mg/dL — ABNORMAL HIGH (ref 65–99)

## 2015-10-26 LAB — CBC
HEMATOCRIT: 25.5 % — AB (ref 36.0–46.0)
HEMOGLOBIN: 8.3 g/dL — AB (ref 12.0–15.0)
MCH: 30.1 pg (ref 26.0–34.0)
MCHC: 32.5 g/dL (ref 30.0–36.0)
MCV: 92.4 fL (ref 78.0–100.0)
Platelets: 98 10*3/uL — ABNORMAL LOW (ref 150–400)
RBC: 2.76 MIL/uL — AB (ref 3.87–5.11)
RDW: 21.3 % — ABNORMAL HIGH (ref 11.5–15.5)
WBC: 5.4 10*3/uL (ref 4.0–10.5)

## 2015-10-26 LAB — BASIC METABOLIC PANEL
ANION GAP: 7 (ref 5–15)
BUN: 17 mg/dL (ref 6–20)
CALCIUM: 8.1 mg/dL — AB (ref 8.9–10.3)
CHLORIDE: 109 mmol/L (ref 101–111)
CO2: 26 mmol/L (ref 22–32)
Creatinine, Ser: 0.44 mg/dL (ref 0.44–1.00)
GFR calc non Af Amer: >60 mL/min (ref >60)
GLUCOSE: 125 mg/dL — AB (ref 65–99)
POTASSIUM: 3.1 mmol/L — AB (ref 3.5–5.1)
Sodium: 142 mmol/L (ref 135–145)

## 2015-10-26 MED ORDER — DEXTROSE 5 % IV SOLN: 1.0000 g | INTRAVENOUS | Status: DC

## 2015-10-26 MED ORDER — METOPROLOL TARTRATE 50 MG PO TABS: 50.0000 mg | ORAL_TABLET | Freq: Two times a day (BID) | ORAL | Status: DC

## 2015-10-26 MED ORDER — HEPARIN SOD (PORK) LOCK FLUSH 100 UNIT/ML IV SOLN
250.0000 [IU] | INTRAVENOUS | Status: AC | PRN
  Administered 2015-10-26: 250 [IU]

## 2015-10-26 MED ORDER — POTASSIUM CHLORIDE 20 MEQ PO PACK
40.0000 meq | PACK | Freq: Once | ORAL | Status: AC
  Administered 2015-10-26: 40 meq via ORAL
  Filled 2015-10-26: qty 2

## 2015-10-26 MED ORDER — POLYETHYLENE GLYCOL 3350 17 G PO PACK: 17.0000 g | PACK | Freq: Every day | ORAL | Status: DC

## 2015-10-26 MED ORDER — LEVETIRACETAM 100 MG/ML PO SOLN: 1500.0000 mg | Freq: Two times a day (BID) | ORAL | Status: DC

## 2015-10-26 MED ORDER — JEVITY 1.5 CAL/FIBER PO LIQD: 1000.0000 mL | ORAL | Status: DC

## 2015-10-26 MED ORDER — PRO-STAT SUGAR FREE PO LIQD: 60.0000 mL | Freq: Two times a day (BID) | ORAL | Status: DC

## 2015-10-26 MED ORDER — FREE WATER: 200.0000 mL | Freq: Three times a day (TID) | Status: DC

## 2015-10-26 MED ORDER — VANCOMYCIN HCL 10 G IV SOLR: 1250.0000 mg | INTRAVENOUS | Status: DC

## 2015-10-26 NOTE — Discharge Summary (Signed)
Physician Discharge Summary  [name] UJW:[id number] DOB: 09/28/1966 DOA: 10/12/2015  PCP: [name], MD  Admit date: 10/12/2015 Discharge date: 10/26/2015  Time spent: 35 minutes  Recommendations for Outpatient Follow-up:  1. Please administer IV antibiotic therapy: Vancomycin 1,250 mg PO q daily and Ceftriaxone 1 gram IV q daily. Anticipated stop date Dec 02, 2015. Plan for a total of 6 weeks of AB therapy.  2. Tube feeds with Jevity 1.5 40 mL/hour 3. Free water flushes: 200 mL TID 4. Please ensure that she receives Miralax daily, colonoscopy revealed ischemic changes likely related to ischemic injury from constipation. Per GI she should get regular miralax until she had loose bowel movements, then miralax can be backed off.  5. Changes in position q 4 hours. She has a stage IV decubitus ulcer.   6. BMP and CBC in 4-5 days   Discharge Diagnoses:  Active Problems:   Acute encephalopathy   Pressure ulcer   Sepsis (HCC)   UTI (lower urinary tract infection)   Decubitus ulcer of sacral region, stage 4 (HCC)   Acute renal failure (ARF) (HCC)   Septic shock (HCC)   Sacral decubitus ulcer   Thrombocytopenia (HCC)   Discharge Condition: Stable  Diet recommendation: Tube feeds  Filed Weights   10/22/15 0328 10/25/15 0507 10/26/15 0529  Weight: 82.555 kg (182 lb) 82.736 kg (182 lb 6.4 oz) 84.732 kg (186 lb 12.8 oz)    History of present illness:  Pt is encephelopathic; therefore, this HPI is obtained from chart review. Meghan PoreLaura Marie Lawson is a 49 y.o. F with PMH as outlined below. She is paraplegic following a ruptured cerebral aneurysm in 2014. Since then, she has lived in SNF (multiple different ones since then, now at [facility] where she just moved last week). She is also non-verbal at baseline.  She had apparently been in her USOH until 12/7 when she was less arouseable then usual. She was profoundly hypotensive and she was found to have multiple metabolic  derangements for which [facility] was [facility] for [facility] evaluation.  In ED, she was hypotensive with SBP in 60's. She was given 3L IVF with minimal improvement; therefore, she was started on levophed. She also had Na of 163, SCr 3.15 (no hx renal insufficiency), BUN 113, lactate 2.37, Hgb 7.3, temp 94.93F. UA suggestive of UTI. CXR clear.  Hospital Course:  [name] is a 49 year old female with a past medical history ruptured cerebral aneurysm in 2014, with resultant quadriplegia, nonverbal at baseline, current resident at skilled nursing facility, [facility] when she was found by nursing staff to be lethargic on admission she was septic evidence by a pressure of 76/15, temperature of 94.2, respiratory rate of 31. She was admitted to the pulmonary critical care service. She required IV pressors support. She showed clinical improvement and was transferred to the medicine service on 10/15/2015. Hospitalization complicated by failure to thrive, having minimal by mouth intake. Palliative care was consulted family meeting held on 10/18/2015 to determine medical goals of care. Family members expressing wishes for placement of PEG tube for nutritional support. She remained stable however became more thrombocytopenic and more anemic requiring pRBC on 12/17 and 12/19, FOBT initially negative now positive. Consulted GI 12/19, plan for flex sig 12/20  Septic shock - Evidenced by blood pressure of 76/15, temperature of 94.2, respiratory of 31, requiring IV pressor support. She was initially admitted to the intensive care unit under  the care of pulmonary critical care medicine. - unclear source, urine culture negative - Blood cultures drawn on 10/12/2015 growing coag negative staph from 1/2 bottles. Suspect that this reflects contaminant.  - ID consulted, discussed with Dr. Ninetta Lights, plan for PICC Line and 6 weeks of Vancomycin and Ceftriaxone - PICC line  placement per IR 12/17.   Anemia with question of a GI Bleed - patient hospitalized last month and was found to have a DVT however with initiation of heparin she appeared to have blood in her stools and an IVC filter was placed. Her Hb stabilized at that time and family was advised to follow up with GI as an outpatient.  - patient with FOBT negative then FOBT positive - patient with ongoing drop in Hb, FOBT positive, consulted GI  - additional pRBC transfusion 12/19 - flex sig performed on 10/25/2015 that showed ulcerated colon. Gi suspecting this was a result of ischemic injury in setting of constipation. Will discharge on miralax.   Acute kidney injury - She initially presented with a BUN of 113 and creatinine of 3.15, likely secondary to septic shock. - Creatinine has gradually improved with fluids   Thrombocytopenia - chronic, noted during prior hospitalizations, thought to be due to her antiepileptics - Sepsis contributing - monitor, seems to have stabilized and now improving - likely playing a factor in her GI bleeding  Hypernatremia. - On presentation had a sodium of 163, due to poor po intake, PEG tube placed 12/15 - nutrition consult for tube feeding and free water flushes - Na now in normal range  History of DVT - Currently not on anticoagulation due to GI bleed. - Status post IVC filter placement  Stage IV decubitus ulcer - I evaluated wound, she has an 8 cm x 6 cm sacral decubitus ulcer with exposed bone. There was mild erythema involving margins. No purulent drainage - Patient was evaluated by wound care during this hospitalization, recommended continuing saline moist to moist dressings, adding prevalon boots. - Ordered air mattress  - surgery evaluated, no need for further surgical debridement   Hypokalemia - AM labs showing K of 3.2  - replete via PEG tube today   History of HTN - Blood pressures elevated, suspect ongoing pain but difficult to  assess  Failure to thrive - She has had minimal PO intake - Workup has not shown a reversible cause of her decline. Labs revealed a white count within normal limits at 7400. K function stable, sodium trended down and now normal. She has been afebrile. Her sats are upper 90s on room air.  - Palliative care was consulted for facilitation indeterminate medical goals of care. Family meeting was held on 10/18/2015. Continue aggressive care - Family members would like PEG tube placement for nutritional support, placed 12/15 - consult nutrition for PEG feeding, initiated 12/16  Procedures:  PEG placement  PICC line placement  Flex Sigmoidoscopy  Consultations:  PCCM  Palliative Care  GI  General Surgery  Discharge Exam: Filed Vitals:   10/25/15 2200 10/26/15 0529  BP: 140/81 140/89  Pulse: 94 96  Temp:  98.3 F (36.8 C)  Resp:  18     GENERAL: NAD, non verbal.   HEENT: no scleral icterus, PERRL  NECK: supple, no LAD  LUNGS: CTA biL, no wheezing  HEART: RRR without MRG  ABDOMEN: soft, non tender  MSK: 8x6 cm large decub ulcer, bone exposed  EXTREMITIES: no clubbing / cyanosis  NEUROLOGIC: does not follow commands.  Discharge Instructions  Discharge Instructions    Call MD for:  difficulty breathing, headache or visual disturbances    Complete by:  As directed      Call MD for:  extreme fatigue    Complete by:  As directed      Call MD for:  hives    Complete by:  As directed      Call MD for:  persistant dizziness or light-headedness    Complete by:  As directed      Call MD for:  persistant nausea and vomiting    Complete by:  As directed      Call MD for:  redness, tenderness, or signs of infection (pain, swelling, redness, odor or green/yellow discharge around incision site)    Complete by:  As directed      Call MD for:  severe uncontrolled pain    Complete by:  As directed      Call MD for:  temperature >100.4    Complete by:  As directed       Call MD for:    Complete by:  As directed      Diet - low sodium heart healthy    Complete by:  As directed      Increase activity slowly    Complete by:  As directed           Current Discharge Medication List    START taking these medications   Details  !! Amino Acids-Protein Hydrolys (FEEDING SUPPLEMENT, PRO-STAT SUGAR FREE 64,) LIQD Place 60 mLs into feeding tube 2 (two) times daily. Qty: 900 mL, Refills: 0    cefTRIAXone 1 g in dextrose 5 % 50 mL Inject 1 g into the vein daily. Qty: 1 mL, Refills: 0    levETIRAcetam (KEPPRA) 100 MG/ML solution Take 15 mLs (1,500 mg total) by mouth 2 (two) times daily. Qty: 473 mL, Refills: 12    polyethylene glycol (MIRALAX) packet Take 17 g by mouth daily. Qty: 14 each, Refills: 0    vancomycin 1,250 mg in sodium chloride 0.9 % 250 mL Inject 1,250 mg into the vein daily. Qty: 1 mL, Refills: 0    Water For Irrigation, Sterile (FREE WATER) SOLN Take 200 mLs by mouth every 8 (eight) hours. Qty: 200 mL, Refills: 0     !! - Potential duplicate medications found. Please discuss with provider.    CONTINUE these medications which have CHANGED   Details  metoprolol (LOPRESSOR) 50 MG tablet Take 1 tablet (50 mg total) by mouth 2 (two) times daily. Qty: 60 tablet, Refills: 1    Nutritional Supplements (FEEDING SUPPLEMENT, JEVITY 1.5 CAL/FIBER,) LIQD Place 1,000 mLs into feeding tube continuous. Qty: 1000 mL, Refills: 0      CONTINUE these medications which have NOT CHANGED   Details  acetaminophen (TYLENOL) 325 MG tablet Take 2 tablets (650 mg total) by mouth daily.    !! Amino Acids-Protein Hydrolys (FEEDING SUPPLEMENT, PRO-STAT SUGAR FREE 64,) LIQD Take 30 mLs by mouth daily.    Ascorbic Acid (VITAMIN C) 1000 MG tablet Take 1,000 mg by mouth daily.    carboxymethylcellulose (REFRESH PLUS) 0.5 % SOLN Place 2 drops into both eyes 3 (three) times daily.    Cholecalciferol (VITAMIN D3) 5000 UNITS TABS Take 5,000 Units by mouth daily  with breakfast.     cholestyramine light (PREVALITE) 4 G packet Take 4 g by mouth 3 (three) times daily. mix in 8 oz. Of Liquid    famotidine (PEPCID) 40 MG/5ML  suspension Take 2.5 mLs (20 mg total) by mouth every 12 (twelve) hours. Qty: 50 mL, Refills: 0    ferrous sulfate 300 (60 FE) MG/5ML syrup Take 5 mLs (300 mg total) by mouth 2 (two) times daily with a meal. Qty: 150 mL, Refills: 3    Lacosamide (VIMPAT) 100 MG TABS Take 100 mg by mouth 2 (two) times daily.    lisinopril (PRINIVIL,ZESTRIL) 10 MG tablet Take 10 mg by mouth daily.    loperamide (IMODIUM A-D) 2 MG tablet Take 4 mg by mouth 4 (four) times daily as needed for diarrhea or loose stools.    Multiple Vitamin (MULTIVITAMIN WITH MINERALS) TABS tablet Take 1 tablet by mouth daily with breakfast.    traZODone (DESYREL) 50 MG tablet Take 25 mg by mouth at bedtime as needed for sleep.      !! - Potential duplicate medications found. Please discuss with provider.    STOP taking these medications     cloNIDine (CATAPRES - DOSED IN MG/24 HR) 0.3 mg/24hr patch      levETIRAcetam (KEPPRA) 750 MG tablet      mirtazapine (REMERON) 7.5 MG tablet      collagenase (SANTYL) ointment      potassium chloride SA (K-DUR,KLOR-CON) 20 MEQ tablet        Allergies  Allergen Reactions  . Amlodipine Swelling  . Penicillins Swelling    Has patient had a PCN reaction causing immediate rash, facial/tongue/throat swelling, SOB or lightheadedness with hypotension: Yes Has tolerated cephalosporins in the past Has patient had a PCN reaction causing severe rash involving mucus membranes or skin necrosis: No Has patient had a PCN reaction that required hospitalization No Has patient had a PCN reaction occurring within the last 10 years: No If all of the above answers are "NO", then may proceed with Cephalosporin use.  . Latex Rash    Unknown   . Other Other (See Comments)    Natural Rubber- Unknown       The results of significant  diagnostics from this hospitalization (including imaging, microbiology, ancillary and laboratory) are listed below for reference.    Significant Diagnostic Studies: Ir Gastrostomy Tube Mod Sed  10/20/2015  CLINICAL DATA:  Quadriparesis ,urosepsis, needs enteral feeding support EXAM: PERC PLACEMENT GASTROSTOMY FLUOROSCOPY TIME:  54 seconds TECHNIQUE: The procedure, risks, benefits, and alternatives were explained to the patient. Questions regarding the procedure were encouraged and answered. The patient understands and consents to the procedure. A 5 French angiographic catheter was placed as orogastric tube. The upper abdomen was prepped with Betadine, draped in usual sterile fashion, and infiltrated locally with 1% lidocaine. Intravenous Fentanyl and Versed were administered as conscious sedation during continuous cardiorespiratory monitoring by the radiology RN, with a total moderate sedation time of less than 30 minutes. Stomach was insufflated using air through the orogastric tube. An 11 French sheath needle was advanced percutaneously into the gastric lumen under fluoroscopy. Gas could be aspirated and a small contrast injection confirmed intraluminal spread. The sheath was exchanged over a guidewire for a 9 [place] vascular sheath, through which the snare device was advanced and used to snare a guidewire passed through the orogastric tube. This was withdrawn, and the snare attached to the 20 French pull-through gastrostomy tube, which was advanced antegrade, positioned with the internal bumper securing the anterior gastric wall to the anterior abdominal wall. Small contrast injection confirms appropriate positioning. The external bumper was applied and the catheter was flushed. COMPLICATIONS: COMPLICATIONS none IMPRESSION: 1. Technically successful  20 French pull-through gastrostomy placement under fluoroscopy. Electronically Signed   By: Corlis Leak M.D.   On: 10/20/2015 16:24   US Renal  10/13/2015   CLINICAL DATA:  Acute renal failure.  Hypertension. EXAM: RENAL / URINARY TRACT ULTRASOUND COMPLETE COMPARISON:  CT 08/29/2015. FINDINGS: Right Kidney: Length: 12.2 cm. Echogenicity within normal limits. No mass or hydronephrosis visualized. Left Kidney: Length: 14.0 cm. Echogenicity within normal limits. No mass or hydronephrosis visualized. Previously identified low-density lesion in the left kidney noted on CT of 08/29/2015 is no longer identified. The left kidney is larger than the right. Although color-flow is noted to both kidneys Given prior IVC thrombosis the possibility of renal vein thrombosis on the left cannot be excluded. Other etiologies of renal enlargement including pyelonephritis cannot be excluded . Bladder: Foley catheter noted in the bladder.  The bladder is nondistended. IMPRESSION: 1. Left renal enlargement. Although color flow is noted to both kidneys, given prior IVC thrombosis the possibility of left renal vein thrombosis cannot be excluded. 2. Previously identified left renal lesion noted on CT of 08/29/2015 is no longer identified. No evidence of hydronephrosis. Foley catheter is in the bladder. No evidence of bladder distention. Electronically Signed   By: Maisie Fus  Register   On: 10/13/2015 07:51   Ir Fluoro Guide Cv Line Right  10/22/2015  CLINICAL DATA:  Stroke EXAM: RIGHT UPPER EXTREMITY PICC LINE PLACEMENT WITH ULTRASOUND AND FLUOROSCOPIC GUIDANCE FLUOROSCOPY TIME:  4 minutes and 54 seconds PROCEDURE: The patient was advised of the possible risks and complications and agreed to undergo the procedure. The patient was then brought to the angiographic suite for the procedure. The right arm was prepped with chlorhexidine, draped in the usual sterile fashion using maximum barrier technique (cap and mask, sterile gown, sterile gloves, large sterile sheet, hand hygiene and cutaneous antisepsis) and infiltrated locally with 1% Lidocaine. Ultrasound demonstrated patency of the right basilic  vein, and this was documented with an image. Under real-time ultrasound guidance, this vein was accessed with a 21 gauge micropuncture needle and image documentation was performed. A 0.018 wire was introduced in to the vein. Over this, a 5 [place] double lumen power PICC was advanced to the lower SVC/right atrial junction. Fluoroscopy during the procedure and fluoro spot radiograph confirms appropriate catheter position. The catheter was flushed and covered with a sterile dressing. COMPLICATIONS: None LENGTH: 40 cm IMPRESSION: Successful right arm power PICC line placement with ultrasound and fluoroscopic guidance. The catheter is ready for use. Electronically Signed   By: Jolaine Click M.D.   On: 10/22/2015 14:00   Ir US Guide Vasc Access Right  10/22/2015  CLINICAL DATA:  Stroke EXAM: RIGHT UPPER EXTREMITY PICC LINE PLACEMENT WITH ULTRASOUND AND FLUOROSCOPIC GUIDANCE FLUOROSCOPY TIME:  4 minutes and 54 seconds PROCEDURE: The patient was advised of the possible risks and complications and agreed to undergo the procedure. The patient was then brought to the angiographic suite for the procedure. The right arm was prepped with chlorhexidine, draped in the usual sterile fashion using maximum barrier technique (cap and mask, sterile gown, sterile gloves, large sterile sheet, hand hygiene and cutaneous antisepsis) and infiltrated locally with 1% Lidocaine. Ultrasound demonstrated patency of the right basilic vein, and this was documented with an image. Under real-time ultrasound guidance, this vein was accessed with a 21 gauge micropuncture needle and image documentation was performed. A 0.018 wire was introduced in to the vein. Over this, a 5 [place] double lumen power PICC was advanced to the lower  SVC/right atrial junction. Fluoroscopy during the procedure and fluoro spot radiograph confirms appropriate catheter position. The catheter was flushed and covered with a sterile dressing. COMPLICATIONS: None LENGTH: 40 cm  IMPRESSION: Successful right arm power PICC line placement with ultrasound and fluoroscopic guidance. The catheter is ready for use. Electronically Signed   By: Jolaine Click M.D.   On: 10/22/2015 14:00   Dg Chest Port 1 View  10/13/2015  CLINICAL DATA:  77 -year-old female with respiratory distress, hypotension. Initial encounter. EXAM: PORTABLE CHEST 1 VIEW COMPARISON:  10/12/2015 and earlier. FINDINGS: Portable AP semi upright view at 0447 hours. Stable right IJ central line. Continued low lung volumes. Pulmonary vascular congestion without overt edema appears regressed. No pneumothorax, pleural effusion or confluent pulmonary opacity. Mildly increased gaseous distension of bowel loops in the visible upper abdomen. Stable cholecystectomy clips. IMPRESSION: Regressed pulmonary vascular congestion, no overt edema. No other cardiopulmonary abnormality. Electronically Signed   By: Odessa Fleming M.D.   On: 10/13/2015 07:10   Dg Chest Portable 1 View  10/12/2015  CLINICAL DATA:  Central line placement EXAM: PORTABLE CHEST 1 VIEW COMPARISON:  10/12/2015 FINDINGS: Cardiomediastinal silhouette is stable. No acute infiltrate or pleural effusion. No pulmonary edema. There is right IJ central line with tip in SVC right atrium junction. There is no pneumothorax. IMPRESSION: Right IJ central line with tip in SVC right atrium junction. No pneumothorax. Electronically Signed   By: Natasha Mead M.D.   On: 10/12/2015 18:42   Dg Chest Portable 1 View  10/12/2015  CLINICAL DATA:  Hypotension EXAM: PORTABLE CHEST 1 VIEW COMPARISON:  September 05, 2015 FINDINGS: There is no edema or consolidation. Heart size and pulmonary vascularity are normal. No adenopathy. There is cortical irregularity along the lateral aspect of the right scapula. IMPRESSION: No edema or consolidation. Cortical irregularity is noted along the lateral right scapula. Dedicated images of the right scapula advised to further assess. Electronically Signed    By: Bretta Bang III M.D.   On: 10/12/2015 16:21    Microbiology: Recent Results (from the past 240 hour(s))  C difficile quick scan w PCR reflex     Status: None   Collection Time: 10/22/15 12:42 PM  Result Value Ref Range Status   C Diff antigen NEGATIVE NEGATIVE Final   C Diff toxin NEGATIVE NEGATIVE Final   C Diff interpretation Negative for toxigenic C. difficile  Final     Labs: Basic Metabolic Panel:  Recent Labs Lab 10/22/15 0433 10/23/15 0434 10/24/15 0633 10/25/15 0505 10/26/15 0500  NA 142 141 141 142 142  K 3.3* 4.0 3.3* 3.2* 3.1*  CL 117* 116* 113* 111 109  CO2 19* 21* GLUCOSE 108* 108* 104* 101* 125*  BUN 11 17 21* 20 17  CREATININE 0.54 0.53 0.50 0.40* 0.44  CALCIUM 7.8* 8.0* 7.8* 8.2* 8.1*   Liver Function Tests:  Recent Labs Lab 10/22/15 0433 10/23/15 0434  AST 15 17  ALT 14 14  ALKPHOS 89 94  BILITOT 0.5 0.5  PROT 4.5* 4.6*  ALBUMIN 1.7* 1.7*   No results for input(s): LIPASE, AMYLASE in the last 168 hours. No results for input(s): AMMONIA in the last 168 hours. CBC:  Recent Labs Lab 10/22/15 0433 10/23/15 0434 10/24/15 0633 10/25/15 0505 10/26/15 0500  WBC 5.5 8.1 5.3 5.9 5.4  NEUTROABS 3.5 4.9  --   --   --   HGB 7.0* 8.0* 7.1* 8.9* 8.3*  HCT 21.5* 24.3* 22.1* 26.8* 25.5*  MCV 89.6 90.7 92.5 91.8 92.4  PLT 68* 61* 72* 74* 98*   Cardiac Enzymes: No results for input(s): CKTOTAL, CKMB, CKMBINDEX, TROPONINI in the last 168 hours. BNP: BNP (last 3 results)  Recent Labs  03/18/15 1240 08/29/15 1305  BNP 69.7 47.2    ProBNP (last 3 results) No results for input(s): PROBNP in the last 8760 hours.  CBG:  Recent Labs Lab 10/25/15 1727 10/25/15 2003 10/26/15 0004 10/26/15 0409 10/26/15 0808  GLUCAP 93 101* 117* 122* 105*       Signed:  Chastin Riesgo  [facility] 10/26/2015, 11:05 AM

## 2015-10-26 NOTE — Progress Notes (Signed)
[facility] PROGRESS NOTE Subjective Staff noted some loose stools since the sigmoid yesterday. No gross bleeding.  Objective: Vital signs in last 24 hours: Temp:  [98.3 F (36.8 C)-98.8 F (37.1 C)] 98.3 F (36.8 C) (12/21 0529) Pulse Rate:  [94-127] 96 (12/21 0529) Resp:  [16-29] 18 (12/21 0529) BP: (106-199)/(81-138) 140/89 mmHg (12/21 0529) SpO2:  [99 %-100 %] 100 % (12/21 0529) Weight:  [84.732 kg (186 lb 12.8 oz)] 84.732 kg (186 lb 12.8 oz) (12/21 0529) Last BM Date: 10/25/15  Intake/Output from previous day: 12/20 0701 - 12/21 0700 In: 1390 [NG/GT:920; IV Piggyback:350] Out: 1650 [Urine:1650] Intake/Output this shift:     Lab Results:  Recent Labs  10/24/15 0633 10/25/15 0505 10/26/15 0500  WBC 5.3 5.9 5.4  HGB 7.1* 8.9* 8.3*  HCT 22.1* 26.8* 25.5*  PLT 72* 74* 98*   BMET  Recent Labs  10/24/15 0633 10/25/15 0505 10/26/15 0500  NA 141 142 142  K 3.3* 3.2* 3.1*  CL 113* 111 109  CO2 23 24 26   CREATININE 0.50 0.40* 0.44   LFT No results for input(s): PROT, AST, ALT, ALKPHOS, BILITOT, BILIDIR, IBILI in the last 72 hours. PT/INR No results for input(s): LABPROT, INR in the last 72 hours. PANCREAS No results for input(s): LIPASE in the last 72 hours.       Studies/Results: No results found.  Medications: I have reviewed the patient's current medications.  Assessment/Plan: 1. Rectal Bleeding. Due to RS ulceration probably ischemic. Will keep on Miralax, adjust the dose down when "cleaned out" of solid stool to obtain regular soft stool.   Elizabethann Lackey JR,Wally Shevchenko L 10/26/2015, 8:01 AM  Pager: [phone] If no answer or after hours call [phone]

## 2015-10-26 NOTE — Progress Notes (Signed)
Patient will DC to: [facility] Anticipated DC date: 10/26/15 Family notified: Son, Torrie Transport by: PTAR  CSW signing off.  Cristobal GoldmannNadia Jaquetta Currier, [place] Clinical Social Worker [phone]

## 2015-10-26 NOTE — Care Management Note (Signed)
Case Management Note  Patient Details  Name: Meghan PoreLaura Marie Lawson MRN: [id number] Date of Birth: 12/27/1965  Subjective/Objective:     Patient is for dc today , CSW following.              Action/Plan:   Expected Discharge Date:                  Expected Discharge Plan:  Skilled Nursing Facility  In-House Referral:  Clinical Social Work  Discharge planning Services  CM Consult  Post Acute Care Choice:    Choice offered to:     DME Arranged:    DME Agency:     HH Arranged:    HH Agency:     Status of Service:  Completed, signed off  Medicare Important Message Given:    Date Medicare IM Given:    Medicare IM give by:    Date Additional Medicare IM Given:    Additional Medicare Important Message give by:     If discussed at Long Length of Stay Meetings, dates discussed:    Additional Comments:  [name], Meghan Rhyner Clinton, RN 10/26/2015, 11:41 AM

## 2015-10-26 NOTE — [place] FL2 (Signed)
[place] MEDICAID FL2 LEVEL OF CARE SCREENING TOOL     IDENTIFICATION  Patient Name: [name] Birthdate: 1966/02/16 Sex: female Admission Date (Current Location): 10/12/2015  [facility] and [place] Number: Wellsite geologist and Address:  [facility], [address], [place], [place] [address]      Provider Number: [id number]  Attending Physician Name and Address:  Jeralyn Bennett, MD  Relative Name and Phone Number:  Gaynelle Adu, [phone]    Current Level of Care: Hospital Recommended Level of Care: Skilled Nursing Facility Prior Approval Number:    Date Approved/Denied:   PASRR Number: [id number]  Discharge Plan: SNF    Current Diagnoses: Patient Active Problem List   Diagnosis Date Noted  . Thrombocytopenia (HCC)   . Sacral decubitus ulcer   . Septic shock (HCC) 10/13/2015  . Acute renal failure (ARF) (HCC)   . DNR (do not resuscitate) discussion   . Adult failure to thrive   . Palliative care encounter 08/29/2015  . Sepsis (HCC) 08/28/2015  . UTI (lower urinary tract infection) 08/28/2015  . Decubitus ulcer of sacral region, stage 4 (HCC) 08/28/2015  . Abscess, sacrum (HCC) 08/28/2015  . Pressure ulcer 06/02/2015  . SAH (subarachnoid hemorrhage) (HCC) 06/01/2015  . Acute encephalopathy 06/01/2015  . Localization-related symptomatic epilepsy and epileptic syndromes with complex partial seizures, not intractable, without status epilepticus (HCC) 05/06/2015  . Hx of ischemic right ACA stroke 05/06/2015  . History of ischemic left ACA stroke 05/06/2015  . C. difficile diarrhea 03/20/2015  . Difficult intravenous access   . Dysphagia   . Hypernatremia 03/16/2015  . Dehydration 03/16/2015  . Seizures (HCC) 07/05/2014  . Stroke (HCC) 07/05/2014  . Chronic respiratory failure (HCC) 04/07/2014  . Tracheitis 03/31/2014  . Pneumonia 03/31/2014  . HCAP (healthcare-associated pneumonia) 03/07/2014  . DVT (deep venous  thrombosis) (HCC) 03/07/2014  . BP (high blood pressure) 09/26/2013  . Aneurysm (HCC) 09/25/2013  . Brain aneurysm 08/17/2013  . Occipital neuralgia 08/17/2013  . Sinus headache 08/17/2013  . Headache(784.0) 06/02/2013  . Palpitations 03/04/2013  . Edema 03/04/2013  . Anxiety state, unspecified 03/04/2013  . Tobacco abuse 02/27/2013  . Chest discomfort 02/10/2013  . Lower abdominal pain 05/01/2012  . Allergic rhinitis 05/01/2012  . HTN (hypertension) 02/25/2012  . Obesity 02/25/2012    Orientation RESPIRATION BLADDER Height & Weight     (Unable to follow commands but responds to voice)  Normal Incontinent, Indwelling catheter (Urinary Catheter)  (170.2 cm) 186 lbs.  BEHAVIORAL SYMPTOMS/MOOD NEUROLOGICAL BOWEL NUTRITION STATUS   (N/A)  (N/A) Incontinent Feeding tube (See DC Summary; PEG Tube)  AMBULATORY STATUS COMMUNICATION OF NEEDS Skin   Total Care (Nonambulatory) Non-Verbally PU Stage and Appropriate Care       PU Stage 4 Dressing:  (On sacrum; See DC Summary wound care note)               Personal Care Assistance Level of Assistance  Total care Bathing Assistance: Maximum assistance     Total Care Assistance: Maximum assistance   Functional Limitations Info  Speech [employer])     Speech Info: Impaired    SPECIAL CARE FACTORS FREQUENCY                       Contractures Contractures Info: Not present    Additional Factors Info  Code Status, Allergies Code Status Info: Full Allergies Info: Amlodipine, Penicillins, Latex, Other  Current Medications (10/26/2015):  This is the current hospital active medication list Current Facility-Administered Medications  Medication Dose Route Frequency Provider Last Rate Last Dose  . acetaminophen (TYLENOL) tablet 650 mg  650 mg Oral Q6H PRN Nyoka CowdenMichael B Wert, MD   650 mg at 10/21/15 2311  . antiseptic oral rinse (CPC / CETYLPYRIDINIUM CHLORIDE 0.05%) solution 7 mL  7 mL Mouth Rinse q12n4p Costin Otelia SergeantM  Gherghe, MD   7 mL at 10/25/15 1200  . cefTRIAXone (ROCEPHIN) 1 g in dextrose 5 % 50 mL IVPB  1 g Intravenous Q24H Leatha Gildingostin M Gherghe, MD   1 g at 10/26/15 1031  . chlorhexidine (PERIDEX) 0.12 % solution 15 mL  15 mL Mouth Rinse BID Leatha Gildingostin M Gherghe, MD   15 mL at 10/26/15 1031  . famotidine (PEPCID) 40 MG/5ML suspension 20 mg  20 mg Oral BID Herby AbrahamMichelle T Bell, RPH   20 mg at 10/25/15 2217  . feeding supplement (JEVITY 1.5 CAL/FIBER) liquid 1,000 mL  1,000 mL Per Tube Continuous Stana BuntingJenifer A Williams, RD 40 mL/hr at 10/26/15 0553 1,000 mL at 10/26/15 0553  . feeding supplement (PRO-STAT SUGAR FREE 64) liquid 60 mL  60 mL Per Tube BID Jenifer A Williams, RD   60 mL at 10/26/15 1012  . free water 200 mL  200 mL Oral 3 times per day Jeralyn BennettEzequiel Zamora, MD   200 mL at 10/26/15 0553  . hydrALAZINE (APRESOLINE) injection 5 mg  5 mg Intravenous Q4H PRN Leda GauzeKaren J Kirby-Graham, NP   5 mg at 10/25/15 0509  . lacosamide (VIMPAT) tablet 100 mg  100 mg Oral BID Herby AbrahamMichelle T Bell, RPH   100 mg at 10/26/15 1013  . levETIRAcetam (KEPPRA) 100 MG/ML solution 1,500 mg  1,500 mg Oral BID Jeralyn BennettEzequiel Zamora, MD   1,500 mg at 10/26/15 1012  . lisinopril (PRINIVIL,ZESTRIL) tablet 20 mg  20 mg Oral Daily Jeralyn BennettEzequiel Zamora, MD   20 mg at 10/26/15 1013  . metoprolol (LOPRESSOR) tablet 50 mg  50 mg Oral BID Jeralyn BennettEzequiel Zamora, MD   50 mg at 10/26/15 1013  . multivitamin with minerals tablet 1 tablet  1 tablet Oral Daily Idell PicklesKimberly A Harris, RD   1 tablet at 10/26/15 1013  . ondansetron (ZOFRAN) injection 4 mg  4 mg Intravenous Q6H PRN Praveen Mannam, MD   4 mg at 10/12/15 2240  . polyethylene glycol (MIRALAX / GLYCOLAX) packet 17 g  17 g Oral TID Carman ChingJames Edwards, MD   17 g at 10/26/15 1012  . potassium chloride (KLOR-CON) packet 40 mEq  40 mEq Oral Once Jeralyn BennettEzequiel Zamora, MD      . sodium chloride 0.9 % injection 10-40 mL  10-40 mL Intracatheter PRN Nelda Bucksaniel J Feinstein, MD   10 mL at 10/25/15 0512  . vancomycin (VANCOCIN) 1,250 mg in sodium chloride 0.9  % 250 mL IVPB  1,250 mg Intravenous Q24H Leatha Gildingostin M Gherghe, MD   1,250 mg at 10/26/15 1031     Discharge Medications: Please see discharge summary for a list of discharge medications.  Relevant Imaging Results:  Relevant Lab Results:   Additional Information [id number]  Mearl LatinNadia S Jakie Debow, LCSWA

## 2015-10-26 NOTE — Progress Notes (Addendum)
Removed sacral dressing and new dressing with wet to dry Kerlix and abd pad placed on secured with tape. Report given to [facility] transport team. [facility]. At [phone].

## 2015-10-26 NOTE — Clinical Social Work Placement (Signed)
   CLINICAL SOCIAL WORK PLACEMENT  NOTE  Date:  10/26/2015  Patient Details  Name: [name] MRN: [id number] Date of Birth: 11/14/1965  Clinical Social Work is seeking post-discharge placement for this patient at the Skilled  Nursing Facility level of care (*CSW will initial, date and re-position this form in  chart as items are completed):  Yes   Patient/family provided with [facility] Clinical Social Work Department's list of facilities offering this level of care within the geographic area requested by the patient (or if unable, by the patient's family).  Yes   Patient/family informed of their freedom to choose among providers that offer the needed level of care, that participate in Medicare, Medicaid or managed care program needed by the patient, have an available bed and are willing to accept the patient.  Yes   Patient/family informed of [facility]'s ownership interest in [facility] and [facility], as well as of the fact that they are under no obligation to receive care at these facilities.  PASRR submitted to EDS on       PASRR number received on       Existing PASRR number confirmed on 10/18/15     FL2 transmitted to all facilities in geographic area requested by pt/family on 10/18/15     FL2 transmitted to all facilities within larger geographic area on       Patient informed that his/her managed care company has contracts with or will negotiate with certain facilities, including the following:        Yes   Patient/family informed of bed offers received.  [facility]     Physician recommends and patient chooses bed at      [facility]  Patient to be transferred to facility by [facility]     Patient family notified on 10/26/15 of transfer.  Name of family member notified:  Torrie, Son     PHYSICIAN Please prepare priority discharge summary, including medications     Additional Comment:     _______________________________________________ Mearl LatinNadia S Zahid Carneiro, LCSWA 10/26/2015, 11:09 AM

## 2015-11-08 ENCOUNTER — Ambulatory Visit: Payer: 59 | Admitting: Neurology

## 2015-11-14 ENCOUNTER — Encounter (HOSPITAL_BASED_OUTPATIENT_CLINIC_OR_DEPARTMENT_OTHER): Payer: 59 | Attending: Internal Medicine

## 2015-11-18 ENCOUNTER — Encounter: Payer: Medicaid Other | Attending: Surgery | Admitting: Surgery

## 2015-11-18 DIAGNOSIS — G8221 Paraplegia, complete: Secondary | ICD-10-CM | POA: Insufficient documentation

## 2015-11-18 DIAGNOSIS — E440 Moderate protein-calorie malnutrition: Secondary | ICD-10-CM | POA: Insufficient documentation

## 2015-11-18 DIAGNOSIS — L89154 Pressure ulcer of sacral region, stage 4: Secondary | ICD-10-CM | POA: Diagnosis present

## 2015-11-19 NOTE — Progress Notes (Signed)
MEHAR, SAGEN ([id number]) Visit Report for 11/18/2015 Allergy List Details Patient Name: [name], [name]. Date of Service: 11/18/2015 9:30 AM Medical Record Number: [id number] Patient Account Number: [id number] Date of Birth/Sex: [birth year] (50 y.o. Female) Treating RN: Phillis Haggis Primary Care Physician: Ruthe Mannan Other Clinician: Referring Physician: Ruthe Mannan Treating Physician/Extender: Rudene Re in Treatment: 0 Allergies Active Allergies amlodipine Reaction: swelling Severity: Severe penicillin Reaction: swelling, rash, SOB, hypotension Severity: Severe latex Reaction: rash Severity: Severe Allergy Notes Electronic Signature(s) Signed: 11/18/2015 4:44:47 PM By: Alejandro Mulling Entered By: Alejandro Mulling on 11/18/2015 10:05:03 [name] ([id number]) -------------------------------------------------------------------------------- Arrival Information Details Patient Name: [name]. Date of Service: 11/18/2015 9:30 AM Medical Record Number: [id number] Patient Account Number: [id number] Date of Birth/Sex: [birth year] (50 y.o. Female) Treating RN: Phillis Haggis Primary Care Physician: Ruthe Mannan Other Clinician: Referring Physician: Ruthe Mannan Treating Physician/Extender: Rudene Re in Treatment: 0 Visit Information Patient Arrived: Stretcher Arrival Time: 09:56 Accompanied By: son Transfer Assistance: Other Patient Identification Verified: Yes Secondary Verification Process Yes Completed: Patient Requires Transmission-Based No Precautions: Patient Has Alerts: No Electronic Signature(s) Signed: 11/18/2015 4:44:47 PM By: Alejandro Mulling Entered By: Alejandro Mulling on 11/18/2015 11:12:13 [name] ([id number]) -------------------------------------------------------------------------------- Clinic Level of Care Assessment Details Patient Name: [name]. Date of Service: 11/18/2015 9:30 AM Medical Record Number:  [id number] Patient Account Number: [id number] Date of Birth/Sex: 04-Aug-1966 (50 y.o. Female) Treating RN: Phillis Haggis Primary Care Physician: Ruthe Mannan Other Clinician: Referring Physician: Ruthe Mannan Treating Physician/Extender: Rudene Re in Treatment: 0 Clinic Level of Care Assessment Items TOOL 2 Quantity Score X - Use when only an EandM is performed on the INITIAL visit 1 0 ASSESSMENTS - Nursing Assessment / Reassessment []  - General Physical Exam (combine w/ comprehensive assessment (listed just 0 below) when performed on new pt. evals) X - Comprehensive Assessment (HX, ROS, Risk Assessments, Wounds Hx, etc.) 1 25 ASSESSMENTS - Wound and Skin Assessment / Reassessment X - Simple Wound Assessment / Reassessment - one wound 1 5 []  - Complex Wound Assessment / Reassessment - multiple wounds 0 []  - Dermatologic / Skin Assessment (not related to wound area) 0 ASSESSMENTS - Ostomy and/or Continence Assessment and Care X - Incontinence Assessment and Management 1 10 []  - Ostomy Care Assessment and Management (repouching, etc.) 0 PROCESS - Coordination of Care []  - Simple Patient / Family Education for ongoing care 0 X - Complex (extensive) Patient / Family Education for ongoing care 1 20 X - Staff obtains Chiropractor, Records, Test Results / Process Orders 1 10 X - Staff telephones HHA, Nursing Homes / Clarify orders / etc 1 10 []  - Routine Transfer to another Facility (non-emergent condition) 0 []  - Routine Hospital Admission (non-emergent condition) 0 X - New Admissions / Manufacturing engineer / Ordering NPWT, Apligraf, etc. 1 15 []  - Emergency Hospital Admission (emergent condition) 0 X - Simple Discharge Coordination 1 10 [name], Meghan M. ([id number]) []  - Complex (extensive) Discharge Coordination 0 PROCESS - Special Needs []  - Pediatric / Minor Patient Management 0 []  - Isolation Patient Management 0 []  - Hearing / Language / Visual special needs 0 []  -  Assessment of Community assistance (transportation, D/C planning, etc.) 0 []  - Additional assistance / Altered mentation 0 []  - Support Surface(s) Assessment (bed, cushion, seat, etc.) 0 INTERVENTIONS - Wound Cleansing / Measurement X - Wound Imaging (photographs - any number of wounds) 1 5 []  - Wound Tracing (instead of photographs) 0 X - Simple Wound Measurement -  one wound 1 5 []  - Complex Wound Measurement - multiple wounds 0 X - Simple Wound Cleansing - one wound 1 5 []  - Complex Wound Cleansing - multiple wounds 0 INTERVENTIONS - Wound Dressings []  - Small Wound Dressing one or multiple wounds 0 []  - Medium Wound Dressing one or multiple wounds 0 X - Large Wound Dressing one or multiple wounds 1 20 []  - Application of Medications - injection 0 INTERVENTIONS - Miscellaneous []  - External ear exam 0 []  - Specimen Collection (cultures, biopsies, blood, body fluids, etc.) 0 []  - Specimen(s) / Culture(s) sent or taken to Lab for analysis 0 []  - Patient Transfer (multiple staff / Nurse, adult / Similar devices) 0 []  - Simple Staple / Suture removal (25 or less) 0 []  - Complex Staple / Suture removal (26 or more) 0 [name], Meghan M. ([id number]) []  - Hypo / Hyperglycemic Management (close monitor of Blood Glucose) 0 []  - Ankle / Brachial Index (ABI) - do not check if billed separately 0 Has the patient been seen at the hospital within the last three years: Yes Total Score: 140 Level Of Care: New/Established - Level 4 Electronic Signature(s) Signed: 11/18/2015 4:44:47 PM By: Alejandro Mulling Entered By: Alejandro Mulling on 11/18/2015 15:10:11 [name] ([id number]) -------------------------------------------------------------------------------- Encounter Discharge Information Details Patient Name: [name]. Date of Service: 11/18/2015 9:30 AM Medical Record Number: [id number] Patient Account Number: [id number] Date of Birth/Sex: [birth year] (50 y.o. Female) Treating RN: Phillis Haggis Primary Care Physician: Ruthe Mannan Other Clinician: Referring Physician: Ruthe Mannan Treating Physician/Extender: Rudene Re in Treatment: 0 Encounter Discharge Information Items Discharge Pain Level: 0 Discharge Condition: Stable Ambulatory Status: Stretcher Discharge Destination: Home Transportation: Ambulance Accompanied By: son Schedule Follow-up Appointment: Yes Medication Reconciliation completed and provided to Patient/Care Yes Bennye Nix: Provided on Clinical Summary of Care: 11/18/2015 Form Type Recipient Paper Patient [facility] Electronic Signature(s) Signed: 11/18/2015 11:14:02 AM By: Gwenlyn Perking Entered By: Gwenlyn Perking on 11/18/2015 11:14:02 [name] ([id number]) -------------------------------------------------------------------------------- Lower Extremity Assessment Details Patient Name: [name]. Date of Service: 11/18/2015 9:30 AM Medical Record Number: [id number] Patient Account Number: [id number] Date of Birth/Sex: 02/25/1966 (50 y.o. Female) Treating RN: Phillis Haggis Primary Care Physician: Ruthe Mannan Other Clinician: Referring Physician: Ruthe Mannan Treating Physician/Extender: Rudene Re in Treatment: 0 Electronic Signature(s) Signed: 11/18/2015 4:44:47 PM By: Alejandro Mulling Entered By: Alejandro Mulling on 11/18/2015 10:02:57 [name] ([id number]) -------------------------------------------------------------------------------- Multi Wound Chart Details Patient Name: [name]. Date of Service: 11/18/2015 9:30 AM Medical Record Number: [id number] Patient Account Number: [id number] Date of Birth/Sex: 07-13-1966 (50 y.o. Female) Treating RN: Phillis Haggis Primary Care Physician: Ruthe Mannan Other Clinician: Referring Physician: Ruthe Mannan Treating Physician/Extender: Rudene Re in Treatment: 0 Vital Signs Height(in): 68 Pulse(bpm): 99 Weight(lbs): 170 Blood Pressure 157/113 (mmHg): Body  Mass Index(BMI): 26 Temperature(F): 97.5 Respiratory Rate 18 (breaths/min): Photos: [1:No Photos] [N/A:N/A] Wound Location: [1:Sacrum - Midline] [N/A:N/A] Wounding Event: [1:Pressure Injury] [N/A:N/A] Primary Etiology: [1:Pressure Ulcer] [N/A:N/A] Comorbid History: [1:Hypertension, History of pressure wounds, Paraplegia, Seizure Disorder] [N/A:N/A] Date Acquired: [1:09/06/2015] [N/A:N/A] Weeks of Treatment: [1:0] [N/A:N/A] Wound Status: [1:Open] [N/A:N/A] Measurements L x W x D 7x4.3x2.6 [N/A:N/A] (cm) Area (cm) : [1:23.64] [N/A:N/A] Volume (cm) : [1:61.465] [N/A:N/A] Classification: [1:Category/Stage III] [N/A:N/A] Exudate Amount: [1:Large] [N/A:N/A] Exudate Type: [1:Serosanguineous] [N/A:N/A] Exudate Color: [1:red, brown] [N/A:N/A] Granulation Amount: [1:Large (67-100%)] [N/A:N/A] Granulation Quality: [1:Red, Pink] [N/A:N/A] Necrotic Amount: [1:None Present (0%)] [N/A:N/A] Exposed Structures: [1:Fascia: No Fat: No Tendon: No Muscle: No Joint: No  Bone: No Limited to Skin Breakdown] [N/A:N/A] Epithelialization: None N/A N/A Periwound Skin Texture: No Abnormalities Noted N/A N/A Periwound Skin Moist: Yes N/A N/A Moisture: Periwound Skin Color: No Abnormalities Noted N/A N/A Tenderness on No N/A N/A Palpation: Wound Preparation: Ulcer Cleansing: N/A N/A Rinsed/Irrigated with Saline Topical Anesthetic Applied: Other: lidocaine 4% Treatment Notes Electronic Signature(s) Signed: 11/18/2015 4:44:47 PM By: Alejandro MullingPinkerton, Debra Entered By: Alejandro MullingPinkerton, Debra on 11/18/2015 10:47:10 Meghan HoehnHAITH, Meghan M. ([id number]) -------------------------------------------------------------------------------- Multi-Disciplinary Care Plan Details Patient Name: Meghan HoehnHAITH, Meghan M. Date of Service: 11/18/2015 9:30 AM Medical Record Number: [id number] Patient Account Number: [id number] Date of Birth/Sex: 09/27/1966 (50 y.o. Female) Treating RN: Ashok CordiaPinkerton, Debi Primary Care Physician: Ruthe MannanAron, Talia Other  Clinician: Referring Physician: Ruthe MannanAron, Talia Treating Physician/Extender: Rudene ReBritto, Errol Weeks in Treatment: 0 Active Inactive Abuse / Safety / Falls / Self Care Management Nursing Diagnoses: Potential for falls Potential for injury related to abuse or neglect Goals: Patient will remain injury free Date Initiated: 11/18/2015 Goal Status: Active Patient/caregiver will verbalize/demonstrate measures taken to prevent injury and/or falls Date Initiated: 11/18/2015 Goal Status: Active Interventions: Assess fall risk on admission and as needed Assess self care needs on admission and as needed Notes: Nutrition Nursing Diagnoses: Imbalanced nutrition Goals: Patient/caregiver agrees to and verbalizes understanding of need to use nutritional supplements and/or vitamins as prescribed Date Initiated: 11/18/2015 Goal Status: Active Interventions: Assess patient nutrition upon admission and as needed per policy Notes: Orientation to the Wound Care Program [place] ([id number]) Nursing Diagnoses: Knowledge deficit related to the wound healing center program Goals: Patient/caregiver will verbalize understanding of the Wound Healing Center Program Date Initiated: 11/18/2015 Goal Status: Active Interventions: Provide education on orientation to the wound center Notes: Pressure Nursing Diagnoses: Knowledge deficit related to causes and risk factors for pressure ulcer development Knowledge deficit related to management of pressures ulcers Potential for impaired tissue integrity related to pressure, friction, moisture, and shear Goals: Patient will remain free from development of additional pressure ulcers Date Initiated: 11/18/2015 Goal Status: Active Interventions: Assess: immobility, friction, shearing, incontinence upon admission and as needed Assess offloading mechanisms upon admission and as needed Notes: Soft Tissue Infection Nursing Diagnoses: Impaired tissue  integrity Goals: Patient/caregiver will verbalize understanding of or measures to prevent infection and contamination in the home setting Date Initiated: 11/18/2015 Goal Status: Active Interventions: Assess signs and symptoms of infection every visit Notes: Meghan HoehnHAITH, Meghan M. ([id number]) Wound/Skin Impairment Nursing Diagnoses: Impaired tissue integrity Goals: Ulcer/skin breakdown will have a volume reduction of 30% by week 4 Date Initiated: 11/18/2015 Goal Status: Active Ulcer/skin breakdown will have a volume reduction of 50% by week 8 Date Initiated: 11/18/2015 Goal Status: Active Ulcer/skin breakdown will have a volume reduction of 80% by week 12 Date Initiated: 11/18/2015 Goal Status: Active Interventions: Assess patient/caregiver ability to perform ulcer/skin care regimen upon admission and as needed Notes: Electronic Signature(s) Signed: 11/18/2015 4:44:47 PM By: Alejandro MullingPinkerton, Debra Entered By: Alejandro MullingPinkerton, Debra on 11/18/2015 15:48:06 Meghan HoehnHAITH, Meghan M. ([id number]) -------------------------------------------------------------------------------- Pain Assessment Details Patient Name: Meghan HoehnHAITH, Meghan M. Date of Service: 11/18/2015 9:30 AM Medical Record Number: [id number] Patient Account Number: [id number] Date of Birth/Sex: 05/25/1966 (50 y.o. Female) Treating RN: Phillis HaggisPinkerton, Debi Primary Care Physician: Ruthe MannanAron, Talia Other Clinician: Referring Physician: Ruthe MannanAron, Talia Treating Physician/Extender: Rudene ReBritto, Errol Weeks in Treatment: 0 Active Problems Location of Pain Severity and Description of Pain Patient Has Paino Patient Unable to Respond Site Locations Pain Management and Medication Current Pain Management: Electronic Signature(s) Signed: 11/18/2015 4:44:47 PM By: Alejandro MullingPinkerton, Debra Entered By: Alejandro MullingPinkerton, Debra on 11/18/2015  09:57:49 [name], [name] ([id number]) -------------------------------------------------------------------------------- Patient/Caregiver Education Details Patient  Name: [name], [name]. Date of Service: 11/18/2015 9:30 AM Medical Record Number: [id number] Patient Account Number: [id number] Date of Birth/Gender: [birth year] (50 y.o. Female) Treating RN: Phillis Haggis Primary Care Physician: Ruthe Mannan Other Clinician: Referring Physician: Ruthe Mannan Treating Physician/Extender: Rudene Re in Treatment: 0 Education Assessment Education Provided To: Caregiver Education Topics Provided Wound/Skin Impairment: Handouts: Other: follow MD orders for wound care Methods: Demonstration, Explain/Verbal Responses: State content correctly Electronic Signature(s) Signed: 11/18/2015 4:44:47 PM By: Alejandro Mulling Entered By: Alejandro Mulling on 11/18/2015 11:11:10 [name] ([id number]) -------------------------------------------------------------------------------- Wound Assessment Details Patient Name: [name]. Date of Service: 11/18/2015 9:30 AM Medical Record Number: [id number] Patient Account Number: [id number] Date of Birth/Sex: 1965/11/22 (50 y.o. Female) Treating RN: Ashok Cordia, Debi Primary Care Physician: Ruthe Mannan Other Clinician: Referring Physician: Ruthe Mannan Treating Physician/Extender: Rudene Re in Treatment: 0 Wound Status Wound Number: 1 Primary Pressure Ulcer Etiology: Wound Location: Sacrum - Midline Wound Open Wounding Event: Pressure Injury Status: Date Acquired: 09/06/2015 Comorbid Hypertension, History of pressure Weeks Of Treatment: 0 History: wounds, Paraplegia, Seizure Disorder Clustered Wound: No Photos Photo Uploaded By: Alejandro Mulling on 11/18/2015 15:40:35 Wound Measurements Length: (cm) 7 Width: (cm) 4.3 Depth: (cm) 2.6 Area: (cm) 23.64 Volume: (cm) 61.465 % Reduction in Area: 0% % Reduction in Volume: 0% Epithelialization: None Tunneling: No Undermining: Yes Starting Position (o'clock): 9 Ending Position (o'clock): 12 Maximum Distance: (cm) 3 Wound  Description Classification: Category/Stage III Exudate Amount: Large Exudate Type: Serosanguineous Exudate Color: red, brown Foul Odor After Cleansing: No Wound Bed Granulation Amount: Large (67-100%) Exposed Structure [name], [name]. ([id number]) Granulation Quality: Red, Pink Fascia Exposed: No Necrotic Amount: None Present (0%) Fat Layer Exposed: No Tendon Exposed: No Muscle Exposed: No Joint Exposed: No Bone Exposed: No Limited to Skin Breakdown Periwound Skin Texture Texture Color No Abnormalities Noted: No No Abnormalities Noted: No Moisture No Abnormalities Noted: No Moist: Yes Wound Preparation Ulcer Cleansing: Rinsed/Irrigated with Saline Topical Anesthetic Applied: Other: lidocaine 4%, Treatment Notes Wound #1 (Midline Sacrum) 1. Cleansed with: Clean wound with Normal Saline 2. Anesthetic Topical Lidocaine 4% cream to wound bed prior to debridement 4. Dressing Applied: Other dressing (specify in notes) 5. Secondary Dressing Applied Bordered Foam Dressing Notes wet to dry dressing Electronic Signature(s) Signed: 11/18/2015 4:44:47 PM By: Alejandro Mulling Entered By: Alejandro Mulling on 11/18/2015 15:08:46 [name] ([id number]) -------------------------------------------------------------------------------- Vitals Details Patient Name: [name]. Date of Service: 11/18/2015 9:30 AM Medical Record Number: [id number] Patient Account Number: [id number] Date of Birth/Sex: 1966/03/31 (50 y.o. Female) Treating RN: Ashok Cordia, Debi Primary Care Physician: Ruthe Mannan Other Clinician: Referring Physician: Ruthe Mannan Treating Physician/Extender: Rudene Re in Treatment: 0 Vital Signs Time Taken: 09:58 Temperature (F): 97.5 Height (in): 68 Pulse (bpm): 99 Source: Stated Respiratory Rate (breaths/min): 18 Weight (lbs): 170 Blood Pressure (mmHg): 157/113 Source: Stated Reference Range: 80 - 120 mg / dl Body Mass Index (BMI):  25.8 Notes Son stated weight and height Electronic Signature(s) Signed: 11/18/2015 4:44:47 PM By: Alejandro Mulling Entered By: Alejandro Mulling on 11/18/2015 10:01:47

## 2015-11-19 NOTE — Progress Notes (Signed)
Meghan, Lawson ([id number]) Visit Report for 11/18/2015 Abuse/Suicide Risk Screen Details Patient Name: [name], [name]. Date of Service: 11/18/2015 9:30 AM Medical Record Number: [id number] Patient Account Number: [id number] Date of Birth/Sex: [birth year] (50 y.o. Female) Treating RN: Phillis Haggis Primary Care Physician: Ruthe Mannan Other Clinician: Referring Physician: Ruthe Mannan Treating Physician/Extender: Rudene Re in Treatment: 0 Abuse/Suicide Risk Screen Items Answer Notes Pt is unable to answer these questions she is non-verbal. Electronic Signature(s) Signed: 11/18/2015 4:44:47 PM By: Alejandro Mulling Entered By: Alejandro Mulling on 11/18/2015 10:16:48 [name] ([id number]) -------------------------------------------------------------------------------- Activities of Daily Living Details Patient Name: [name]. Date of Service: 11/18/2015 9:30 AM Medical Record Number: [id number] Patient Account Number: [id number] Date of Birth/Sex: 01/10/1966 (50 y.o. Female) Treating RN: Phillis Haggis Primary Care Physician: Ruthe Mannan Other Clinician: Referring Physician: Ruthe Mannan Treating Physician/Extender: Rudene Re in Treatment: 0 Activities of Daily Living Items Answer Activities of Daily Living (Please select one for each item) Drive Automobile Not Able Take Medications Not Able Use Telephone Not Able Care for Appearance Not Able Use Toilet Not Able Mady Haagensen / Shower Not Able Dress Self Not Able Feed Self Not Able Walk Not Able Get In / Out Bed Not Able Housework Not Able Prepare Meals Not Able Handle Money Not Able Shop for Self Not Able Electronic Signature(s) Signed: 11/18/2015 4:44:47 PM By: Alejandro Mulling Entered By: Alejandro Mulling on 11/18/2015 10:17:21 [name] ([id number]) -------------------------------------------------------------------------------- Education Assessment Details Patient Name: [name]. Date  of Service: 11/18/2015 9:30 AM Medical Record Number: [id number] Patient Account Number: [id number] Date of Birth/Sex: Jun 14, 1966 (50 y.o. Female) Treating RN: Phillis Haggis Primary Care Physician: Ruthe Mannan Other Clinician: Referring Physician: Ruthe Mannan Treating Physician/Extender: Rudene Re in Treatment: 0 Primary Learner Assessed: Caregiver son Reason Patient is not Primary Learner: pt is non-verbal Learning Preferences/Education Level/Primary Language Learning Preference: Explanation, Printed Material Preferred Language: English Cognitive Barrier Assessment/Beliefs Language Barrier: No Translator Needed: No Memory Deficit: No Emotional Barrier: No Cultural/Religious Beliefs Affecting Medical No Care: Physical Barrier Assessment Impaired Vision: Yes Glasses Impaired Hearing: No Decreased Hand dexterity: No Knowledge/Comprehension Assessment Knowledge Level: High Comprehension Level: High Ability to understand written High instructions: Ability to understand verbal High instructions: Motivation Assessment Anxiety Level: Calm Cooperation: Cooperative Education Importance: Acknowledges Need Interest in Health Problems: Asks Questions Perception: Coherent Willingness to Engage in Self- High Management Activities: Readiness to Engage in Self- High Management Activities: Electronic Signature(s) CLAUDETTE, WERMUTH ([id number]) Signed: 11/18/2015 4:44:47 PM By: Alejandro Mulling Entered By: Alejandro Mulling on 11/18/2015 10:17:59 [name] ([id number]) -------------------------------------------------------------------------------- Fall Risk Assessment Details Patient Name: [name]. Date of Service: 11/18/2015 9:30 AM Medical Record Number: [id number] Patient Account Number: [id number] Date of Birth/Sex: 03-25-1966 (50 y.o. Female) Treating RN: Ashok Cordia, Debi Primary Care Physician: Ruthe Mannan Other Clinician: Referring Physician: Ruthe Mannan Treating Physician/Extender: Rudene Re in Treatment: 0 Fall Risk Assessment Items Have you had 2 or more falls in the last 12 monthso 0 Yes Have you had any fall that resulted in injury in the last 12 monthso 0 Yes FALL RISK ASSESSMENT: History of falling - immediate or within 3 months 0 No Secondary diagnosis 0 No Ambulatory aid None/bed rest/wheelchair/nurse 0 Yes Crutches/cane/walker 0 No Furniture 0 No IV Access/Saline Lock 0 No Gait/Training Normal/bed rest/immobile 0 Yes Weak 0 No Impaired 20 Yes Mental Status Oriented to own ability 0 No Electronic Signature(s) Signed: 11/18/2015 4:44:47 PM By: Alejandro Mulling Entered By: Alejandro Mulling on 11/18/2015  10:18:32 Meghan HoehnHAITH, Aalaya M. ([id number]) -------------------------------------------------------------------------------- Foot Assessment Details Patient Name: Meghan HoehnHAITH, Trishia M. Date of Service: 11/18/2015 9:30 AM Medical Record Number: [id number] Patient Account Number: [id number] Date of Birth/Sex: 05/17/1966 (50 y.o. Female) Treating RN: Phillis HaggisPinkerton, Debi Primary Care Physician: Ruthe MannanAron, Talia Other Clinician: Referring Physician: Ruthe MannanAron, Talia Treating Physician/Extender: Rudene ReBritto, Errol Weeks in Treatment: 0 Foot Assessment Items [x]  Unable to perform due to altered mental status Site Locations + = Sensation present, - = Sensation absent, C = Callus, U = Ulcer R = Redness, W = Warmth, M = Maceration, PU = Pre-ulcerative lesion F = Fissure, S = Swelling, D = Dryness Assessment Right: Left: Other Deformity: No No Prior Foot Ulcer: No No Prior Amputation: No No Charcot Joint: No No Ambulatory Status: Gait: Electronic Signature(s) Signed: 11/18/2015 4:44:47 PM By: Alejandro MullingPinkerton, Debra Entered By: Alejandro MullingPinkerton, Debra on 11/18/2015 10:19:32 Meghan HoehnHAITH, Dashawn M. ([id number]) -------------------------------------------------------------------------------- Nutrition Risk Assessment Details Patient Name: Meghan HoehnHAITH, Devynn M. Date  of Service: 11/18/2015 9:30 AM Medical Record Number: [id number] Patient Account Number: [id number] Date of Birth/Sex: 08/08/1966 (50 y.o. Female) Treating RN: Phillis HaggisPinkerton, Debi Primary Care Physician: Ruthe MannanAron, Talia Other Clinician: Referring Physician: Ruthe MannanAron, Talia Treating Physician/Extender: Rudene ReBritto, Errol Weeks in Treatment: 0 Height (in): 68 Weight (lbs): 170 Body Mass Index (BMI): 25.8 Nutrition Risk Assessment Items NUTRITION RISK SCREEN: I have an illness or condition that made me change the kind and/or 0 No amount of food I eat I eat fewer than two meals per day 0 No I eat few fruits and vegetables, or milk products 0 No I have three or more drinks of beer, liquor or wine almost every day 0 No I have tooth or mouth problems that make it hard for me to eat 0 No I don't always have enough money to buy the food I need 0 No I eat alone most of the time 0 No I take three or more different prescribed or over-the-counter drugs a 0 No day Without wanting to, I have lost or gained 10 pounds in the last six 0 No months I am not always physically able to shop, cook and/or feed myself 0 No Nutrition Protocols Good Risk Protocol Moderate Risk Protocol Notes Pt gets fed by feeding tube. Electronic Signature(s) Signed: 11/18/2015 4:44:47 PM By: Alejandro MullingPinkerton, Debra Entered By: Alejandro MullingPinkerton, Debra on 11/18/2015 10:19:25

## 2015-11-19 NOTE — Progress Notes (Signed)
KETTY, BITTON ([id number]) Visit Report for 11/18/2015 Chief Complaint Document Details Patient Name: [name], [name]. Date of Service: 11/18/2015 9:30 AM Medical Record Number: [id number] Patient Account Number: [id number] Date of Birth/Sex: 03/03/1966 (50 y.o. Female) Treating RN: Primary Care Physician: Ruthe Mannan Other Clinician: Referring Physician: Ruthe Mannan Treating Physician/Extender: Rudene Re in Treatment: 0 Information Obtained from: Patient Chief Complaint Patient is at the clinic for treatment of an open pressure ulcer she's had for over 6 months now. Electronic Signature(s) Signed: 11/18/2015 10:58:53 AM By: Evlyn Kanner MD, FACS Entered By: Evlyn Kanner on 11/18/2015 10:58:53 [name] ([id number]) -------------------------------------------------------------------------------- HPI Details Patient Name: [name]. Date of Service: 11/18/2015 9:30 AM Medical Record Number: [id number] Patient Account Number: [id number] Date of Birth/Sex: 1966-01-04 (50 y.o. Female) Treating RN: Primary Care Physician: Ruthe Mannan Other Clinician: Referring Physician: Ruthe Mannan Treating Physician/Extender: Rudene Re in Treatment: 0 History of Present Illness Location: sacral decubitus ulcer Quality: Patient reports No Pain. Severity: Patient states wound (s) are getting better. 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 Recently, in December she was profoundly hypotensive and found to have several medical issues and was admitted to [facility]. She was admitted between December 7 and 10/26/2015. 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 she also had failure to thrive and hence the family members opted to have a PEG tube placed. she also had a PICC line placed and was on IV antibiotics which included vancomycin and ceftriaxone to be given until January 27. At present she is also on tube feeds and a bowel regimen for constipation. Electronic Signature(s) Signed: 11/18/2015 10:59:52 AM By: Evlyn Kanner MD, FACS Previous Signature: 11/18/2015 10:25:43 AM Version By: Evlyn Kanner MD, FACS Previous Signature: 11/18/2015 10:25:17 AM Version By: Evlyn Kanner MD, FACS Entered By: Evlyn Kanner on 11/18/2015 10:59:52 [name] ([id number]) -------------------------------------------------------------------------------- Physical Exam Details Patient Name: [name]. Date of Service: 11/18/2015 9:30 AM Medical Record Number: [id number] Patient Account Number: [id number] Date of Birth/Sex: 1965-12-22 (50 y.o. Female) Treating RN: Primary Care Physician: Ruthe Mannan Other Clinician: Referring Physician: Ruthe Mannan Treating Physician/Extender: Rudene Re in Treatment: 0 Constitutional . Pulse regular. Respirations normal and unlabored. Afebrile. . Eyes Nonicteric. Reactive to light. Ears, Nose, Mouth, and Throat Lips, teeth, and gums WNL.Marland Kitchen Moist mucosa without lesions. Neck supple and nontender. No palpable supraclavicular or cervical adenopathy. Normal sized without goiter. Respiratory WNL. No retractions.. Cardiovascular Pedal Pulses WNL. No clubbing, cyanosis or edema. Gastrointestinal (GI) Abdomen without masses or tenderness.. No liver or spleen enlargement or tenderness.. Lymphatic No adneopathy. No adenopathy. No adenopathy. Musculoskeletal Adexa without tenderness or enlargement.. Digits and nails w/o clubbing, cyanosis,  infection, petechiae, ischemia, or inflammatory conditions.. Integumentary (Hair, Skin) No suspicious lesions. No crepitus or fluctuance. No peri-wound warmth or erythema. No masses.Marland Kitchen Psychiatric Judgement and insight Intact.. No evidence of depression, anxiety, or agitation.. Notes Large, clean sacral decubitus ulcer with healthy granulation tissue and no evidence of exposed bone. Significant undermining present. Electronic Signature(s) Signed: 11/18/2015 11:00:24 AM By: Evlyn Kanner MD, FACS Entered  By: Evlyn Kanner on 11/18/2015 11:00:23 [name] ([id number]) -------------------------------------------------------------------------------- Physician Orders Details Patient Name: [name]. Date of Service: 11/18/2015 9:30 AM Medical Record Number: [id number] Patient Account Number: [id number] Date of Birth/Sex: Mar 08, 1966 (50 y.o. Female) Treating RN: Phillis Haggis Primary Care Physician: Ruthe Mannan Other Clinician: Referring Physician: Ruthe Mannan Treating Physician/Extender: Rudene Re in Treatment: 0 Verbal / Phone Orders: Yes Clinician: Ashok Cordia, Debi Read Back and Verified: Yes Diagnosis Coding Wound Cleansing Wound #1 Midline Sacrum o Clean wound with Normal Saline. Anesthetic Wound #1 Midline Sacrum o Topical Lidocaine 4% cream applied to wound bed prior to debridement Skin Barriers/Peri-Wound Care Wound #1 Midline Sacrum o Skin Prep Primary Wound Dressing Wound #1 Midline Sacrum o Saline moistened gauze - until you apply wound vac Secondary Dressing Wound #1 Midline Sacrum o Boardered Foam Dressing - until you place on the wound vac Dressing Change Frequency Wound #1 Midline Sacrum o Change dressing every day. - until you place on wound vac o Change Dressing Monday, Wednesday, Friday - wound vac to be change 3 times a week Off-Loading Wound #1 Midline Sacrum o Turn and reposition every 2 hours o Mattress - air  mattress nursing home to initiate Negative Pressure Wound Therapy Wound #1 Midline Sacrum o Wound VAC settings at 125/130 mmHg continuous pressure. Use BLACK/GREEN foam to wound cavity. Use WHITE foam to fill any tunnel/s and/or undermining. Change VAC dressing CORRIE, REDER. ([id number]) 3 X WEEK. Change canister as indicated when full. Nurse may titrate settings and frequency of dressing changes as clinically indicated. - nursing home to order and nursing home to initiate application Medications-please add to medication list. Wound #1 Midline Sacrum o Other: - vitamin C and Zinc via tube may do liquid medication if available Electronic Signature(s) Signed: 11/18/2015 3:13:05 PM By: Evlyn Kanner MD, FACS Signed: 11/18/2015 4:44:47 PM By: Alejandro Mulling Entered By: Alejandro Mulling on 11/18/2015 11:00:49 [name] ([id number]) -------------------------------------------------------------------------------- Problem List Details Patient Name: [name]. Date of Service: 11/18/2015 9:30 AM Medical Record Number: [id number] Patient Account Number: [id number] Date of Birth/Sex: 11/25/1965 (50 y.o. 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 Date of Service: 11/18/2015 9:30 AM Medical  Record Number: [id number] Patient Account Number: [id number] Date of Birth/Sex: 03-03-1966 (50 y.o. Female) Treating RN: Primary Care Physician: Ruthe Mannan Other Clinician: Referring Physician: Ruthe Mannan Treating Physician/Extender: Rudene Re in Treatment: 0 Subjective Chief Complaint Information obtained from Patient Patient is at the clinic for treatment of an open pressure ulcer she's had for over 6 months now. History of Present Illness (HPI) The following HPI elements were documented for the patient's wound: Location: sacral decubitus ulcer Quality: Patient reports No Pain. Severity: Patient states wound (s) are getting better. Duration: Patient has had the wound for > 6 months prior to seeking treatment at the wound center Context: The wound appeared gradually over time Modifying Factors: Other treatment(s) tried include:on IV antibiotics via a PICC line 50 year old female who is known to be paraplegic following a ruptured cerebral aneurysm  in 2014. Recently, in December she was profoundly hypotensive and found to have several medical issues and was admitted to [facility]. She was admitted between December 7 and 10/26/2015. 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 she also had failure to thrive and hence the family members opted to have a PEG tube placed. she also had a PICC line placed and was on IV antibiotics which included vancomycin and ceftriaxone to be given until January 27. At present she is also on tube feeds and a bowel regimen for constipation. Wound History Patient presents with 1 open wound that has been present for approximately November 2016. Patient has been treating wound in the following manner: wet to dry dressings. Laboratory tests have not  been performed in the last month. Patient reportedly has tested positive for an antibiotic resistant organism. Patient reportedly has not tested positive for osteomyelitis. Patient reportedly has not had testing performed to evaluate circulation in the legs. Patient experiences the following problems associated with their wounds: infection, swelling. Patient History Information obtained from Caregiver. Allergies amlodipine (Severity: Severe, Reaction: swelling), penicillin (Severity: Severe, Reaction: swelling, rash, SOB, hypotension), latex (Severity: Severe, Reaction: rash) Meghan HoehnHAITH, Tatijana M. ([id number]) General Notes: other- natural rubber- unknown was in the notes Family History Cancer - Siblings, Diabetes - Siblings, Heart Disease - Mother, Hypertension - Maternal Grandparents, Paternal Grandparents, Mother, Father, Siblings, No family history of Hereditary Spherocytosis, Kidney Disease, Lung Disease, Seizures, Stroke, Thyroid Problems, Tuberculosis. Social History Former smoker, Marital Status - Separated, Alcohol Use - Never, Drug Use - No History. 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 Eyes Complains or has symptoms of Glasses / Contacts - glasses. Ear/Nose/Mouth/Throat The patient has no complaints or symptoms. Respiratory The patient has no complaints or symptoms. Endocrine The patient  has no complaints or symptoms. Immunological The patient has no complaints or symptoms. Integumentary (Skin) Complains or has symptoms of Wounds. Meghan HoehnHAITH, Rhandi .. ([id number]) Musculoskeletal Complains or has symptoms of Muscle Weakness. Oncologic The patient has no complaints or symptoms. Psychiatric The patient has no complaints or symptoms. Objective Constitutional Pulse regular. Respirations normal and unlabored. Afebrile. Vitals Time Taken: 9:58 AM, Height: 68 in, Source: Stated, Weight: 170 lbs, Source: Stated, BMI: 25.8, Temperature: 97.5 F, Pulse: 99 bpm, Respiratory Rate: 18 breaths/min, Blood Pressure: 157/113 mmHg. General Notes: Son stated weight and height Eyes Nonicteric. Reactive to light. Ears, Nose, Mouth, and Throat Lips, teeth, and gums WNL.Marland Kitchen. Moist mucosa without lesions. Neck supple and nontender. No palpable supraclavicular or cervical adenopathy. Normal sized without goiter. Respiratory WNL. No retractions.. Cardiovascular Pedal Pulses WNL. No clubbing, cyanosis or edema. Gastrointestinal (GI) Abdomen without masses or tenderness.. No liver or spleen enlargement or tenderness.. Lymphatic No adneopathy. No adenopathy. No adenopathy. Musculoskeletal Adexa without tenderness or enlargement.. Digits and nails w/o clubbing, cyanosis, infection, petechiae, ischemia, or inflammatory conditions.Marland Kitchen. Psychiatric Judgement and insight Intact.. No evidence of depression, anxiety, or agitation.Marland Kitchen. Lina SayreHAITH, Dennise [place] Hampshire[place]. ([id number])  General Notes: Large, clean sacral decubitus ulcer with healthy granulation tissue and no evidence of exposed bone. Significant undermining present. Integumentary (Hair, Skin) No suspicious lesions. No crepitus or fluctuance. No peri-wound warmth or erythema. No masses.. Wound #1 status is Open. Original cause of wound was Pressure Injury. The wound is located on the Midline Sacrum. The wound measures 7cm length x 4.3cm width x 2.6cm depth;  23.64cm^2 area and 61.465cm^3 volume. The wound is limited to skin breakdown. There is no tunneling noted, however, there is undermining starting at 9:00 and ending at 12:00 with a maximum distance of 3cm. There is a large amount of serosanguineous drainage noted. There is large (67-100%) red, pink granulation within the wound bed. There is no necrotic tissue within the wound bed. The periwound skin appearance exhibited: Moist. 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 I have recommended: 1. application of wound VAC to be changed 3 times a week 2. Nutritional support with high protein intake, multivitamins including vitamin C and zinc 3. End mattress and appropriate offloading with constant change in position. 4. Regular visits to the wound care center. 5. A plastic surgery opinion at a later date. Plan The son who is at the bedside and has been her caregiver for a while has had several questions and the onset them and discuss the treatment plan. I have recommended: TANISIA, YOKLEY. ([id number]) 1. application of wound VAC to be changed 3 times a week 2. Nutritional support with high protein intake, multivitamins including vitamin C and zinc 3. End mattress and appropriate offloading with constant change in position. 4. Regular visits to the wound care center. 5. A plastic surgery opinion at a later date. Electronic Signature(s) Signed: 11/18/2015 3:18:21 PM By: Evlyn Kanner MD, FACS Previous Signature: 11/18/2015 1:33:56 PM Version By: Evlyn Kanner MD, FACS Previous Signature: 11/18/2015 11:02:03 AM Version By: Evlyn Kanner MD, FACS Entered By: Evlyn Kanner on 11/18/2015 15:18:21 [name]  ([id number]) -------------------------------------------------------------------------------- ROS/PFSH Details Patient Name: [name]. Date of Service: 11/18/2015 9:30 AM Medical Record Number: [id number] Patient Account Number: [id number] Date of Birth/Sex: 10-02-1966 (50 y.o. Female) Treating RN: Ashok Cordia, Debi Primary Care Physician: Ruthe Mannan Other Clinician: Referring Physician: Ruthe Mannan Treating Physician/Extender: Rudene Re in Treatment: 0 Information Obtained From Caregiver Wound History Do you currently have one or more open woundso Yes How many open wounds do you currently haveo 1 Approximately how long have you had your woundso November 2016 How have you been treating your wound(s) until nowo wet to dry dressings Has your wound(s) ever healed and then re-openedo No Have you had any lab work done in the past montho No Have you tested positive for an antibiotic resistant organism (MRSA, VRE)o Yes Date: 10/06/2015 Have you tested positive for osteomyelitis (bone infection)o No Have you had any tests for circulation on your legso No Have you had other problems associated with your woundso Infection, Swelling Eyes Complaints and Symptoms: Positive for: Glasses / Contacts - glasses Integumentary (Skin) Complaints and Symptoms: Positive for: Wounds Medical History: Positive for: History of pressure wounds Musculoskeletal Complaints and Symptoms: Positive for: Muscle Weakness Constitutional Symptoms (General Health) Complaints and Symptoms: No Complaints or Symptoms Ear/Nose/Mouth/Throat ELZIE, SHEETS. ([id number]) Complaints and Symptoms: No Complaints  or Symptoms Hematologic/Lymphatic Medical History: Past Medical History Notes: hx DVT Respiratory Complaints and Symptoms: No Complaints or Symptoms Medical History: Past Medical History Notes: hx Tracheostomy hx Tracheomalacia Cardiovascular Medical History: Positive for: Hypertension Past  Medical History Notes: Stroke Gastrointestinal Medical History: Past Medical History Notes: incontinent Endocrine Complaints and Symptoms: No Complaints or Symptoms Genitourinary Medical History: Past Medical History Notes: chronic foley hx UTI hx UTI with ecoli Immunological Complaints and Symptoms: No Complaints or Symptoms Medical History: Past Medical History NotesMEKIYAH, GLADWELL ([id number]) hx septic shock Neurologic Medical History: Positive for: Paraplegia; Seizure Disorder Past Medical History Notes: stroke hx subarachnoid hemorrhage hx acute encephalopathy Oncologic Complaints and Symptoms: No Complaints or Symptoms Psychiatric Complaints and Symptoms: No Complaints or Symptoms Family and Social History Cancer: Yes - Siblings; Diabetes: Yes - Siblings; Heart Disease: Yes - Mother; Hereditary Spherocytosis: No; Hypertension: Yes - Maternal Grandparents, Paternal Grandparents, Mother, Father, Siblings; Kidney Disease: No; Lung Disease: No; Seizures: No; Stroke: No; Thyroid Problems: No; Tuberculosis: No; Former smoker; Marital Status - Separated; Alcohol Use: Never; Drug Use: No History; Financial Concerns: No; Food, Clothing or Shelter Needs: No; Support System Lacking: No; Transportation Concerns: No; Advanced Directives: No; Patient does not want information on Advanced Directives; Do not resuscitate: No; Living Will: Yes (Not Provided); Medical Power of Attorney: Yes - Dorisann Frames (Not Provided) Physician Affirmation I have reviewed and agree with the above information. Electronic Signature(s) Signed: 11/18/2015 1:17:45 PM By: Evlyn Kanner MD, FACS Signed: 11/18/2015 4:44:47 PM By: Alejandro Mulling Entered By: Evlyn Kanner on 11/18/2015 13:17:44 [name] ([id number]) -------------------------------------------------------------------------------- SuperBill Details Patient Name: [name]. Date of Service: 11/18/2015 Medical Record Number:  [id number] Patient Account Number: [id number] Date of Birth/Sex: October 07, 1966 (50 y.o. 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, complete Facility Procedures CPT4 Code: [id number] Description: 99214 - WOUND CARE VISIT-LEV 4 EST PT Modifier: Quantity: 1 Physician Procedures CPT4 Code: 3244010 Description: 99204 - WC PHYS LEVEL 4 - NEW PT ICD-10 Description Diagnosis L89.154 Pressure ulcer of sacral region, stage 4 E44.0 Moderate protein-calorie malnutrition 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

## 2015-12-02 ENCOUNTER — Encounter: Payer: Medicaid Other | Admitting: Surgery

## 2015-12-02 DIAGNOSIS — L89154 Pressure ulcer of sacral region, stage 4: Secondary | ICD-10-CM | POA: Diagnosis not present

## 2015-12-03 ENCOUNTER — Inpatient Hospital Stay (HOSPITAL_COMMUNITY)
Admission: EM | Admit: 2015-12-03 | Discharge: 2015-12-09 | DRG: 871 | Disposition: A | Discharge status: Skilled Nursing Facility | Payer: Commercial Managed Care - HMO | Attending: Internal Medicine | Admitting: Internal Medicine

## 2015-12-03 ENCOUNTER — Encounter (HOSPITAL_COMMUNITY): Payer: Self-pay | Admitting: Nurse Practitioner

## 2015-12-03 DIAGNOSIS — R4702 Dysphasia: Secondary | ICD-10-CM | POA: Diagnosis present

## 2015-12-03 DIAGNOSIS — E870 Hyperosmolality and hypernatremia: Secondary | ICD-10-CM | POA: Diagnosis present

## 2015-12-03 DIAGNOSIS — T839XXA Unspecified complication of genitourinary prosthetic device, implant and graft, initial encounter: Secondary | ICD-10-CM

## 2015-12-03 DIAGNOSIS — E876 Hypokalemia: Secondary | ICD-10-CM | POA: Diagnosis present

## 2015-12-03 DIAGNOSIS — E669 Obesity, unspecified: Secondary | ICD-10-CM | POA: Diagnosis present

## 2015-12-03 DIAGNOSIS — D72829 Elevated white blood cell count, unspecified: Secondary | ICD-10-CM | POA: Diagnosis present

## 2015-12-03 DIAGNOSIS — E872 Acidosis: Secondary | ICD-10-CM | POA: Diagnosis present

## 2015-12-03 DIAGNOSIS — Z8673 Personal history of transient ischemic attack (TIA), and cerebral infarction without residual deficits: Secondary | ICD-10-CM

## 2015-12-03 DIAGNOSIS — R1310 Dysphagia, unspecified: Secondary | ICD-10-CM | POA: Diagnosis present

## 2015-12-03 DIAGNOSIS — Z930 Tracheostomy status: Secondary | ICD-10-CM

## 2015-12-03 DIAGNOSIS — T83511A Infection and inflammatory reaction due to indwelling urethral catheter, initial encounter: Secondary | ICD-10-CM | POA: Diagnosis present

## 2015-12-03 DIAGNOSIS — Z931 Gastrostomy status: Secondary | ICD-10-CM

## 2015-12-03 DIAGNOSIS — I10 Essential (primary) hypertension: Secondary | ICD-10-CM | POA: Diagnosis present

## 2015-12-03 DIAGNOSIS — E860 Dehydration: Secondary | ICD-10-CM | POA: Diagnosis present

## 2015-12-03 DIAGNOSIS — G8220 Paraplegia, unspecified: Secondary | ICD-10-CM | POA: Diagnosis present

## 2015-12-03 DIAGNOSIS — Z9049 Acquired absence of other specified parts of digestive tract: Secondary | ICD-10-CM

## 2015-12-03 DIAGNOSIS — Z8249 Family history of ischemic heart disease and other diseases of the circulatory system: Secondary | ICD-10-CM

## 2015-12-03 DIAGNOSIS — I69120 Aphasia following nontraumatic intracerebral hemorrhage: Secondary | ICD-10-CM

## 2015-12-03 DIAGNOSIS — R159 Full incontinence of feces: Secondary | ICD-10-CM | POA: Diagnosis present

## 2015-12-03 DIAGNOSIS — A419 Sepsis, unspecified organism: Principal | ICD-10-CM | POA: Diagnosis present

## 2015-12-03 DIAGNOSIS — T83028A Displacement of other indwelling urethral catheter, initial encounter: Secondary | ICD-10-CM | POA: Diagnosis not present

## 2015-12-03 DIAGNOSIS — I69169 Other paralytic syndrome following nontraumatic intracerebral hemorrhage affecting unspecified side: Secondary | ICD-10-CM

## 2015-12-03 DIAGNOSIS — Y846 Urinary catheterization as the cause of abnormal reaction of the patient, or of later complication, without mention of misadventure at the time of the procedure: Secondary | ICD-10-CM | POA: Diagnosis present

## 2015-12-03 DIAGNOSIS — N390 Urinary tract infection, site not specified: Secondary | ICD-10-CM | POA: Diagnosis present

## 2015-12-03 DIAGNOSIS — D649 Anemia, unspecified: Secondary | ICD-10-CM | POA: Diagnosis present

## 2015-12-03 DIAGNOSIS — B961 Klebsiella pneumoniae [K. pneumoniae] as the cause of diseases classified elsewhere: Secondary | ICD-10-CM | POA: Diagnosis present

## 2015-12-03 DIAGNOSIS — Z90710 Acquired absence of both cervix and uterus: Secondary | ICD-10-CM

## 2015-12-03 DIAGNOSIS — R627 Adult failure to thrive: Secondary | ICD-10-CM | POA: Diagnosis present

## 2015-12-03 DIAGNOSIS — Z880 Allergy status to penicillin: Secondary | ICD-10-CM

## 2015-12-03 DIAGNOSIS — Z888 Allergy status to other drugs, medicaments and biological substances status: Secondary | ICD-10-CM

## 2015-12-03 DIAGNOSIS — R32 Unspecified urinary incontinence: Secondary | ICD-10-CM | POA: Diagnosis present

## 2015-12-03 DIAGNOSIS — Z91040 Latex allergy status: Secondary | ICD-10-CM

## 2015-12-03 DIAGNOSIS — R569 Unspecified convulsions: Secondary | ICD-10-CM

## 2015-12-03 DIAGNOSIS — Z79899 Other long term (current) drug therapy: Secondary | ICD-10-CM

## 2015-12-03 DIAGNOSIS — Z7401 Bed confinement status: Secondary | ICD-10-CM

## 2015-12-03 DIAGNOSIS — G40909 Epilepsy, unspecified, not intractable, without status epilepticus: Secondary | ICD-10-CM | POA: Diagnosis present

## 2015-12-03 DIAGNOSIS — B999 Unspecified infectious disease: Secondary | ICD-10-CM

## 2015-12-03 DIAGNOSIS — Z87891 Personal history of nicotine dependence: Secondary | ICD-10-CM

## 2015-12-03 DIAGNOSIS — L89154 Pressure ulcer of sacral region, stage 4: Secondary | ICD-10-CM | POA: Diagnosis present

## 2015-12-03 NOTE — ED Notes (Signed)
Per EMS patient from [facility].  Patients foley catheter out for the third time this week.  Catheeter is 50f, 30 CC balloon came out of patient with balloon still full.  Patient had some blood after the foley came out.  Per EMS, facility MD request that a new foley be placed.

## 2015-12-03 NOTE — Progress Notes (Signed)
CHERRIE, FRANCA ([id number]) Visit Report for 12/02/2015 Chief Complaint Document Details Patient Name: [name], [name]. Date of Service: 12/02/2015 10:45 AM Medical Record Number: [id number] Patient Account Number: [id number] Date of Birth/Sex: 12-Apr-1966 (50 y.o. Female) Treating RN: Clover Mealy, RN, BSN, [place] Sink Primary Care Physician: Ruthe Mannan Other Clinician: Referring Physician: Ruthe Mannan Treating Physician/Extender: Rudene Re in Treatment: 2 Information Obtained from: Patient Chief Complaint Patient is at the clinic for treatment of an open pressure ulcer she's had for over 6 months now. Electronic Signature(s) Signed: 12/02/2015 11:10:29 AM By: Evlyn Kanner MD, FACS Entered By: Evlyn Kanner on 12/02/2015 11:10:29 [name] ([id number]) -------------------------------------------------------------------------------- Debridement Details Patient Name: [name]. Date of Service: 12/02/2015 10:45 AM Medical Record Number: [id number] Patient Account Number: [id number] Date of Birth/Sex: Aug 07, 1966 (50 y.o. Female) Treating RN: Clover Mealy, RN, BSN, [place] Sink Primary Care Physician: Ruthe Mannan Other Clinician: Referring Physician: Ruthe Mannan Treating Physician/Extender: Rudene Re in Treatment: 2 Debridement Performed for Wound #1 Midline Sacrum Assessment: Performed By: Physician Evlyn Kanner, MD Debridement: Open Wound/Selective Debridement Selective Description: Pre-procedure Yes Verification/Time Out Taken: Start Time: 11:05 Pain Control: Lidocaine 4% Topical Solution Level: Non-Viable Tissue Total Area Debrided (L x 4 (cm) x 3 (cm) = 12 (cm) W): Tissue and other Non-Viable, Fibrin/Slough, Subcutaneous material debrided: Instrument: Curette Bleeding: Minimum Hemostasis Achieved: Pressure End Time: 11:10 Procedural Pain: 0 Post Procedural Pain: 0 Response to Treatment: Procedure was tolerated well Post Debridement Measurements of Total  Wound Length: (cm) 7 Stage: Category/Stage III Width: (cm) 3.5 Depth: (cm) 5 Volume: (cm) 96.211 Post Procedure Diagnosis Same as Pre-procedure Electronic Signature(s) Signed: 12/02/2015 11:10:24 AM By: Evlyn Kanner MD, FACS Signed: 12/02/2015 2:47:16 PM By: Elpidio Eric BSN, RN Previous Signature: 12/02/2015 11:09:49 AM Version By: Evlyn Kanner MD, FACS Entered By: Evlyn Kanner on 12/02/2015 11:10:23 [name] ([id number] Lawson ([id number]) -------------------------------------------------------------------------------- HPI Details Patient Name: [name], [name]. Date of Service: 12/02/2015 10:45 AM Medical Record Number: [id number] Patient Account Number: [id number] Date of Birth/Sex: 25-Feb-1966 (50 y.o. Female) Treating RN: Clover Mealy, RN, BSN, [place] Sink Primary Care Physician: Ruthe Mannan Other Clinician: Referring Physician: Ruthe Mannan Treating Physician/Extender: Rudene Re in Treatment: 2 History of Present Illness Location: sacral decubitus ulcer Quality: Patient reports No Pain. Severity: Patient states wound (s) are getting better. 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 Recently, in December she was profoundly hypotensive and found to have several medical issues and was admitted to [facility]. She was admitted between December 7 and 10/26/2015. 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 she also had failure to thrive and hence the family members opted to have a  PEG tube placed. she also had a PICC line placed and was on IV antibiotics which included vancomycin and ceftriaxone to be given until January 27. At present she is also on tube feeds and a bowel regimen for constipation. 12/02/2015 -- she has had her wound VAC in position since last week and has been doing well with this. Electronic Signature(s) Signed: 12/02/2015 11:10:50 AM By: Evlyn Kanner MD, FACS Entered  By: Evlyn Kanner on 12/02/2015 11:10:50 [name] ([id number]) -------------------------------------------------------------------------------- Physical Exam Details Patient Name: [name], [name]. Date of Service: 12/02/2015 10:45 AM Medical Record Number: [id number] Patient Account Number: [id number] Date of Birth/Sex: Nov 27, 1965 (50 y.o. Female) Treating RN: Clover Mealy, RN, BSN, [place] Sink Primary Care Physician: Ruthe Mannan Other Clinician: Referring Physician: Ruthe Mannan Treating Physician/Extender: Rudene Re in Treatment: 2 Constitutional . Pulse regular. Respirations normal and unlabored. Afebrile. . Eyes Nonicteric. Reactive to light. Ears, Nose, Mouth, and Throat Lips, teeth, and gums WNL.Marland Kitchen Moist mucosa without lesions. Neck supple and nontender. No palpable supraclavicular or cervical adenopathy. Normal sized without goiter. Respiratory WNL. No retractions.. Breath sounds WNL, No rubs, rales, rhonchi, or wheeze.. Cardiovascular Heart rhythm and rate regular, no murmur or gallop.. Pedal Pulses WNL. No clubbing, cyanosis or edema. Lymphatic No adneopathy. No adenopathy. No adenopathy. Musculoskeletal Adexa without tenderness or enlargement.. Digits and nails w/o clubbing, cyanosis, infection, petechiae, ischemia, or inflammatory conditions.. Integumentary (Hair, Skin) No suspicious lesions. No crepitus or fluctuance. No peri-wound warmth or erythema. No masses.Marland Kitchen Psychiatric Judgement and insight Intact.. No evidence of depression, anxiety, or  agitation.. Notes the edges of the wound were freshened today with a curette and all the debris was removed with sharp dissection. Minimal bleeding was controlled with pressure. Electronic Signature(s) Signed: 12/02/2015 11:11:15 AM By: Evlyn Kanner MD, FACS Entered By: Evlyn Kanner on 12/02/2015 11:11:15 [name] ([id number]) -------------------------------------------------------------------------------- Physician Orders Details Patient Name: [name]. Date of Service: 12/02/2015 10:45 AM Medical Record Number: [id number] Patient Account Number: [id number] Date of Birth/Sex: [birth year] (50 y.o. Female) Treating RN: Clover Mealy, RN, BSN, [place] Sink Primary Care Physician: Ruthe Mannan Other Clinician: Referring Physician: Ruthe Mannan Treating Physician/Extender: Rudene Re in Treatment: 2 Verbal / Phone Orders: Yes Clinician: Afful, RN, BSN, Rita Read Back and Verified: Yes Diagnosis Coding Wound Cleansing Wound #1 Midline Sacrum o Clean wound with Normal Saline. Anesthetic Wound #1 Midline Sacrum o Topical Lidocaine 4% cream applied to wound bed prior to debridement Skin Barriers/Peri-Wound Care Wound #1 Midline Sacrum o Skin Prep Primary Wound Dressing Wound #1 Midline Sacrum o Saline moistened gauze - until you apply wound vac Secondary Dressing Wound #1 Midline Sacrum o Boardered Foam Dressing - until you place on the wound vac Dressing Change Frequency Wound #1 Midline Sacrum o Change dressing every day. - until you place on wound vac o Change Dressing Monday, Wednesday, Friday - wound vac to be change 3 times a week Off-Loading Wound #1 Midline Sacrum o Turn and reposition every 2 hours o Mattress - air mattress nursing home to initiate Negative Pressure Wound Therapy Wound #1 Midline Sacrum o Wound VAC settings at 125/130 mmHg continuous pressure. Use BLACK/GREEN foam to wound cavity. Use WHITE foam to fill any tunnel/s and/or  undermining. Change VAC dressing MAYO, OWCZARZAK. ([id number]) 3 X WEEK. Change canister as indicated when full. Nurse may titrate settings and frequency of dressing changes as clinically indicated. - nursing home to order and nursing home to initiate application Medications-please add to medication list. Wound #1 Midline Sacrum o Other: - vitamin C and Zinc via tube may do liquid medication if available Electronic Signature(s) Signed: 12/02/2015 2:47:16 PM By: Elpidio Eric BSN, RN Signed: 12/02/2015 4:48:22 PM By: Evlyn Kanner MD, FACS Entered By: Elpidio Eric on 12/02/2015 11:08:02 [name] ([id number]) -------------------------------------------------------------------------------- Problem List Details Patient Name: [name]. Date of Service: 12/02/2015 10:45 AM Medical Record Number: [id number] Patient Account Number: [id number] Date of Birth/Sex: 25-May-1966 (49  y.o. 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 Date of Service: 12/02/2015 10:45 AM Medical Record Number: [id number] Patient Account Number: [id number] Date of Birth/Sex: July 16, 1966 (50 y.o. Female) Treating RN: Clover Mealy, RN, BSN, [place] Sink Primary Care Physician: Ruthe Mannan Other Clinician: Referring Physician:  Ruthe Mannan Treating Physician/Extender: Rudene Re in Treatment: 2 Subjective Chief Complaint Information obtained from Patient Patient is at the clinic for treatment of an open pressure ulcer she's had for over 6 months now. History of Present Illness (HPI) The following HPI elements were documented for the patient's wound: Location: sacral decubitus ulcer Quality: Patient reports No Pain. Severity: Patient states wound (s) are getting better. Duration: Patient has had the wound for > 6 months prior to seeking treatment at the wound center Context: The wound appeared gradually over time Modifying Factors: Other treatment(s) tried include:on IV antibiotics via a PICC line 50 year old female who is known to be paraplegic following a ruptured cerebral aneurysm in 2014. Recently, in December she was profoundly hypotensive and found to have several medical issues and was admitted to [facility]. She was admitted between December 7 and 10/26/2015. 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 she also had failure to thrive and hence the family members opted to have a PEG tube placed. she also had a PICC line placed and was on IV antibiotics which included vancomycin and ceftriaxone to be given until January 27. At present she is also on tube feeds and a bowel regimen for constipation. 12/02/2015 -- she has had her wound VAC in position since last week and has been doing well with this. Objective Constitutional Pulse regular. Respirations normal and unlabored. Afebrile. Vitals Time Taken: 10:58 AM, Height: 68 in, Weight: 170 lbs, BMI: 25.8, Temperature: 98.7 F, Pulse: 107 bpm, Respiratory Rate: 18 breaths/min, Blood Pressure: 140/95 mmHg. LITHZY, BERNARD. ([id number]) Eyes Nonicteric.  Reactive to light. Ears, Nose, Mouth, and Throat Lips, teeth, and gums WNL.Marland Kitchen Moist mucosa without lesions. Neck supple and nontender. No palpable supraclavicular or cervical adenopathy. Normal sized without goiter. Respiratory WNL. No retractions.. Breath sounds WNL, No rubs, rales, rhonchi, or wheeze.. Cardiovascular Heart rhythm and rate regular, no murmur or gallop.. Pedal Pulses WNL. No clubbing, cyanosis or edema. Lymphatic No adneopathy. No adenopathy. No adenopathy. Musculoskeletal Adexa without tenderness or enlargement.. Digits and nails w/o clubbing, cyanosis, infection, petechiae, ischemia, or inflammatory conditions.Marland Kitchen Psychiatric Judgement and insight Intact.. No evidence of depression, anxiety, or agitation.. General Notes: the edges of the wound were freshened today with a curette and all the debris was removed with sharp dissection. Minimal bleeding was controlled with pressure. Integumentary (Hair, Skin) No suspicious lesions. No crepitus or fluctuance. No peri-wound warmth or erythema. No masses.. Wound #1 status is Open. Original cause of wound was Pressure Injury. The wound is located on the Midline Sacrum. The wound measures 7cm length x 3.5cm  width x 5cm depth; 19.242cm^2 area and 96.211cm^3 volume. The wound is limited to skin breakdown. There is no tunneling or undermining noted. There is a large amount of serosanguineous drainage noted. There is large (67-100%) red, pink granulation within the wound bed. There is no necrotic tissue within the wound bed. The periwound skin appearance did not exhibit: Callus, Crepitus, Excoriation, Fluctuance, Friable, Induration, Localized Edema, Rash, Scarring, Dry/Scaly, Maceration, Moist, Atrophie Blanche, Cyanosis, Ecchymosis, Hemosiderin Staining, Mottled, Pallor, Rubor, Erythema. Periwound temperature was noted as No Abnormality. 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 There was a Non-Viable Tissue Open Wound/Selective [phone]) debridement with total area of 12 sq cm performed by Evlyn Kanner, MD. with the following instrument(s): Curette to remove Non-Viable tissue/material including Fibrin/Slough and Subcutaneous after achieving pain control using Lidocaine 4% Topical Solution. A time out was conducted prior to the start of the procedure. A Minimum amount of bleeding was controlled with Pressure. The procedure was tolerated well with a pain level of 0 throughout and a pain level of 0 following the procedure. Post Debridement Measurements: 7cm length x 3.5cm width x 5cm depth; 96.211cm^3 volume. Post debridement Stage noted as Category/Stage III. Post procedure Diagnosis Wound #1: Same as Pre-Procedure Plan Wound Cleansing: Wound #1 Midline Sacrum: Clean wound with Normal Saline. Anesthetic: Wound #1 Midline Sacrum: Topical Lidocaine 4% cream applied to wound bed prior to debridement Skin Barriers/Peri-Wound Care: Wound #1 Midline Sacrum: Skin Prep Primary Wound Dressing: Wound #1 Midline Sacrum: Saline moistened gauze - until you apply wound vac Secondary Dressing: Wound #1 Midline Sacrum: Boardered Foam Dressing - until you place on the wound vac Dressing Change Frequency: Wound #1 Midline Sacrum: Change dressing every day. - until you place on wound vac Change Dressing Monday, Wednesday, Friday - wound vac to be change 3 times a week Off-Loading: Wound #1 Midline Sacrum: BAILYN, SPACKMAN. ([id number]) Turn and reposition every 2 hours Mattress - air mattress nursing home to initiate Negative Pressure Wound Therapy: Wound #1 Midline Sacrum: Wound VAC settings at 125/130 mmHg continuous pressure. Use BLACK/GREEN foam to wound cavity. Use WHITE foam to fill  any tunnel/s and/or undermining. Change VAC dressing 3 X WEEK. Change canister as indicated when full. Nurse may titrate settings and frequency of dressing changes as clinically indicated. - nursing home to order and nursing home to initiate application Medications-please add to medication list.: Wound #1 Midline Sacrum: Other: - vitamin C and Zinc via tube may do liquid medication if available I have recommended: 1. application of wound VAC to be changed 3 times a week 2. Nutritional support with high protein intake, multivitamins including vitamin C and zinc 3. Air mattress and appropriate offloading with constant change in position. 4. Regular visits to the wound care center. 5. A plastic surgery opinion at a later date. Electronic Signature(s) Signed: 12/02/2015 11:12:09 AM By: Evlyn Kanner MD, FACS Entered By: Evlyn Kanner on 12/02/2015 11:12:09 [name] ([id number]) -------------------------------------------------------------------------------- SuperBill Details Patient Name: [name]. Date of Service: 12/02/2015 Medical Record Number: [id number] Patient Account Number: [id number] Date of Birth/Sex: [birth year] (50 y.o. 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

## 2015-12-03 NOTE — Progress Notes (Signed)
[name], [name] ([id number]) Visit Report for 12/02/2015 Arrival Information Details Patient Name: [name], [name]. Date of Service: 12/02/2015 10:45 AM Medical Record Number: [id number] Patient Account Number: [id number] Date of Birth/Sex: July 11, 1966 (50 y.o. Female) Treating RN: Clover Mealy, RN, BSN, [place] Sink Primary Care Physician: Ruthe Mannan Other Clinician: Referring Physician: Ruthe Mannan Treating Physician/Extender: Rudene Re in Treatment: 2 Visit Information History Since Last Visit Added or deleted any medications: No Patient Arrived: Stretcher Any new allergies or adverse reactions: No Arrival Time: 10:56 Had a fall or experienced change in No Accompanied By: ems staff activities of daily living that may affect Transfer Assistance: None risk of falls: Patient Identification Verified: Yes Signs or symptoms of abuse/neglect since last No Secondary Verification Process Yes visito Completed: Hospitalized since last visit: No Patient Requires Transmission-Based No Has Dressing in Place as Prescribed: Yes Precautions: Pain Present Now: No Patient Has Alerts: No Electronic Signature(s) Signed: 12/02/2015 2:47:16 PM By: Elpidio Eric BSN, RN Entered By: Elpidio Eric on 12/02/2015 10:56:41 [name] ([id number]) -------------------------------------------------------------------------------- Encounter Discharge Information Details Patient Name: [name]. Date of Service: 12/02/2015 10:45 AM Medical Record Number: [id number] Patient Account Number: [id number] Date of Birth/Sex: 17-May-1966 (51 y.o. Female) Treating RN: Clover Mealy, RN, BSN, [place] Sink Primary Care Physician: Ruthe Mannan Other Clinician: Referring Physician: Ruthe Mannan Treating Physician/Extender: Rudene Re in Treatment: 2 Encounter Discharge Information Items Discharge Pain Level: 0 Discharge Condition: Stable Ambulatory Status: Stretcher Discharge Destination: Nursing Home Transportation:  Ambulance Schedule Follow-up Appointment: No Medication Reconciliation completed and provided to Patient/Care No Jakob Kimberlin: Provided on Clinical Summary of Care: 12/02/2015 Form Type Recipient Paper Patient [facility] Electronic Signature(s) Signed: 12/02/2015 2:47:16 PM By: Elpidio Eric BSN, RN Previous Signature: 12/02/2015 11:13:37 AM Version By: Gwenlyn Perking Entered By: Elpidio Eric on 12/02/2015 11:14:22 [name] ([id number]) -------------------------------------------------------------------------------- Lower Extremity Assessment Details Patient Name: [name]. Date of Service: 12/02/2015 10:45 AM Medical Record Number: [id number] Patient Account Number: [id number] Date of Birth/Sex: February 04, 1966 (50 y.o. Female) Treating RN: Afful, RN, BSN, [place] Sink Primary Care Physician: Ruthe Mannan Other Clinician: Referring Physician: Ruthe Mannan Treating Physician/Extender: Rudene Re in Treatment: 2 Electronic Signature(s) Signed: 12/02/2015 2:47:16 PM By: Elpidio Eric BSN, RN Entered By: Elpidio Eric on 12/02/2015 10:58:50 [name] ([id number]) -------------------------------------------------------------------------------- Multi Wound Chart Details Patient Name: [name]. Date of Service: 12/02/2015 10:45 AM Medical Record Number: [id number] Patient Account Number: [id number] Date of Birth/Sex: Apr 02, 1966 (50 y.o. Female) Treating RN: Clover Mealy, RN, BSN,  [place] Primary Care Physician: Ruthe Mannan Other Clinician: Referring Physician: Ruthe Mannan Treating Physician/Extender: Rudene Re in Treatment: 2 Vital Signs Height(in): 68 Pulse(bpm): 107 Weight(lbs): 170 Blood Pressure 140/95 (mmHg): Body Mass Index(BMI): 26 Temperature(F): 98.7 Respiratory Rate 18 (breaths/min): Photos: [1:No Photos] [N/A:N/A] Wound Location: [1:Sacrum - Midline] [N/A:N/A] Wounding Event: [1:Pressure Injury] [N/A:N/A] Primary Etiology: [1:Pressure Ulcer] [N/A:N/A] Comorbid  History: [1:Hypertension, History of pressure wounds, Paraplegia, Seizure Disorder] [N/A:N/A] Date Acquired: [1:09/06/2015] [N/A:N/A] Weeks of Treatment: [1:2] [N/A:N/A] Wound Status: [1:Open] [N/A:N/A] Measurements L x W x D 7x3.5x5 [N/A:N/A] (cm) Area (cm) : [1:19.242] [N/A:N/A] Volume (cm) : [1:96.211] [N/A:N/A] % Reduction in Area: [1:18.60%] [N/A:N/A] % Reduction in Volume: -56.50% [N/A:N/A] Classification: [1:Category/Stage III] [N/A:N/A] Exudate Amount: [1:Large] [N/A:N/A] Exudate Type: [1:Serosanguineous] [N/A:N/A] Exudate Color: [1:red, brown] [N/A:N/A] Foul Odor After [1:Yes] [N/A:N/A] Cleansing: Odor Anticipated Due to No [N/A:N/A] Product Use: Granulation Amount: [1:Large (67-100%)] [N/A:N/A] Granulation Quality: [1:Red, Pink] [N/A:N/A] Necrotic Amount: [1:None Present (0%)] [N/A:N/A] Exposed Structures: [1:Fascia: No Fat: No] [N/A:N/A] Tendon: No Muscle:  No Joint: No Bone: No Limited to Skin Breakdown Epithelialization: None N/A N/A Periwound Skin Texture: Edema: No N/A N/A Excoriation: No Induration: No Callus: No Crepitus: No Fluctuance: No Friable: No Rash: No Scarring: No Periwound Skin Maceration: No N/A N/A Moisture: Moist: No Dry/Scaly: No Periwound Skin Color: Atrophie Blanche: No N/A N/A Cyanosis: No Ecchymosis: No Erythema: No Hemosiderin Staining: No Mottled: No Pallor: No Rubor: No Temperature: No Abnormality N/A N/A Tenderness on No N/A N/A Palpation: Wound Preparation: Ulcer Cleansing: N/A N/A Rinsed/Irrigated with Saline Topical Anesthetic Applied: Other: lidocaine 4% Treatment Notes Electronic Signature(s) Signed: 12/02/2015 2:47:16 PM By: Elpidio Eric BSN, RN Entered By: Elpidio Eric on 12/02/2015 11:06:00 [name] ([id number]) -------------------------------------------------------------------------------- Multi-Disciplinary Care Plan Details Patient Name: [name], [name]. Date of Service: 12/02/2015 10:45  AM Medical Record Number: [id number] Patient Account Number: [id number] Date of Birth/Sex: April 30, 1966 (50 y.o. Female) Treating RN: Afful, RN, BSN, [place] Primary Care Physician: Ruthe Mannan Other Clinician: Referring Physician: Ruthe Mannan Treating Physician/Extender: Rudene Re in Treatment: 2 Active Inactive Abuse / Safety / Falls / Self Care Management Nursing Diagnoses: Potential for falls Potential for injury related to abuse or neglect Goals: Patient will remain injury free Date Initiated: 11/18/2015 Goal Status: Active Patient/caregiver will verbalize/demonstrate measures taken to prevent injury and/or falls Date Initiated: 11/18/2015 Goal Status: Active Interventions: Assess fall risk on admission and as needed Assess self care needs on admission and as needed Notes: Nutrition Nursing Diagnoses: Imbalanced nutrition Goals: Patient/caregiver agrees to and verbalizes understanding of need to use nutritional supplements and/or vitamins as prescribed Date Initiated: 11/18/2015 Goal Status: Active Interventions: Assess patient nutrition upon admission and as needed per policy Notes: Orientation to the Wound Care Program [place]. ([id number]) Nursing Diagnoses: Knowledge deficit related to the wound healing center program Goals: Patient/caregiver will verbalize understanding of the Wound Healing Center Program Date Initiated: 11/18/2015 Goal Status: Active Interventions: Provide education on orientation to the wound center Notes: Pressure Nursing Diagnoses: Knowledge deficit related to causes and risk factors for pressure ulcer development Knowledge deficit related to management of pressures ulcers Potential for impaired tissue integrity related to pressure, friction, moisture, and shear Goals: Patient will remain free from development of additional pressure ulcers Date Initiated: 11/18/2015 Goal Status: Active Interventions: Assess: immobility,  friction, shearing, incontinence upon admission and as needed Assess offloading mechanisms upon admission and as needed Notes: Soft Tissue Infection Nursing Diagnoses: Impaired tissue integrity Goals: Patient/caregiver will verbalize understanding of or measures to prevent infection and contamination in the home setting Date Initiated: 11/18/2015 Goal Status: Active Interventions: Assess signs and symptoms of infection every visit Notes: [name], [name] ([id number]) Wound/Skin Impairment Nursing Diagnoses: Impaired tissue integrity Goals: Ulcer/skin breakdown will have a volume reduction of 30% by week 4 Date Initiated: 11/18/2015 Goal Status: Active Ulcer/skin breakdown will have a volume reduction of 50% by week 8 Date Initiated: 11/18/2015 Goal Status: Active Ulcer/skin breakdown will have a volume reduction of 80% by week 12 Date Initiated: 11/18/2015 Goal Status: Active Interventions: Assess patient/caregiver ability to perform ulcer/skin care regimen upon admission and as needed Notes: Electronic Signature(s) Signed: 12/02/2015 2:47:16 PM By: Elpidio Eric BSN, RN Entered By: Elpidio Eric on 12/02/2015 11:03:15 [name] ([id number]) -------------------------------------------------------------------------------- Patient/Caregiver Education Details Patient Name: [name]. Date of Service: 12/02/2015 10:45 AM Medical Record Number: [id number] Patient Account Number: [id number] Date of Birth/Gender: April 15, 1966 (50 y.o. Female) Treating RN: Clover Mealy, RN, BSN, [place] Sink Primary Care Physician: Ruthe Mannan Other Clinician: Referring Physician: Ruthe Mannan  Treating Physician/Extender: Rudene Re in Treatment: 2 Education Assessment Education Provided To: Caregiver Education Topics Provided Welcome To The Wound Care Center: Methods: Explain/Verbal Responses: State content correctly Electronic Signature(s) Signed: 12/02/2015 2:47:16 PM By: Elpidio Eric BSN,  RN Entered By: Elpidio Eric on 12/02/2015 11:14:32 [name] ([id number]) -------------------------------------------------------------------------------- Wound Assessment Details Patient Name: [name]. Date of Service: 12/02/2015 10:45 AM Medical Record Number: [id number] Patient Account Number: [id number] Date of Birth/Sex: [birth year] (50 y.o. Female) Treating RN: Afful, RN, BSN, [place] Primary Care Physician: Ruthe Mannan Other Clinician: Referring Physician: Ruthe Mannan Treating Physician/Extender: Rudene Re in Treatment: 2 Wound Status Wound Number: 1 Primary Pressure Ulcer Etiology: Wound Location: Sacrum - Midline Wound Open Wounding Event: Pressure Injury Status: Date Acquired: 09/06/2015 Comorbid Hypertension, History of pressure Weeks Of Treatment: 2 History: wounds, Paraplegia, Seizure Disorder Clustered Wound: No Photos Photo Uploaded By: Elpidio Eric on 12/02/2015 13:01:02 Wound Measurements Length: (cm) 7 Width: (cm) 3.5 Depth: (cm) 5 Area: (cm) 19.242 Volume: (cm) 96.211 % Reduction in Area: 18.6% % Reduction in Volume: -56.5% Epithelialization: None Tunneling: No Undermining: No Wound Description Classification: Category/Stage III Exudate Amount: Large Exudate Type: Serosanguineous Exudate Color: red, brown Foul Odor After Cleansing: Yes Due to Product Use: No Wound Bed Granulation Amount: Large (67-100%) Exposed Structure Granulation Quality: Red, Pink Fascia Exposed: No Necrotic Amount: None Present (0%) Fat Layer Exposed: No Tendon Exposed: No Muscle Exposed: No [name], [name] ([id number]) Joint Exposed: No Bone Exposed: No Limited to Skin Breakdown Periwound Skin Texture Texture Color No Abnormalities Noted: No No Abnormalities Noted: No Callus: No Atrophie Blanche: No Crepitus: No Cyanosis: No Excoriation: No Ecchymosis: No Fluctuance: No Erythema: No Friable: No Hemosiderin Staining: No Induration:  No Mottled: No Localized Edema: No Pallor: No Rash: No Rubor: No Scarring: No Temperature / Pain Moisture Temperature: No Abnormality No Abnormalities Noted: No Dry / Scaly: No Maceration: No Moist: No Wound Preparation Ulcer Cleansing: Rinsed/Irrigated with Saline Topical Anesthetic Applied: Other: lidocaine 4%, Treatment Notes Wound #1 (Midline Sacrum) 1. Cleansed with: Clean wound with Normal Saline 3. Peri-wound Care: Skin Prep 4. Dressing Applied: Other dressing (specify in notes) 5. Secondary Dressing Applied Bordered Foam Dressing Notes wet to dry dressing Electronic Signature(s) Signed: 12/02/2015 2:47:16 PM By: Elpidio Eric BSN, RN Entered By: Elpidio Eric on 12/02/2015 11:03:11 [name] ([id number]) -------------------------------------------------------------------------------- Vitals Details Patient Name: [name]. Date of Service: 12/02/2015 10:45 AM Medical Record Number: [id number] Patient Account Number: [id number] Date of Birth/Sex: [birth year] (50 y.o. Female) Treating RN: Afful, RN, BSN, [place] Primary Care Physician: Ruthe Mannan Other Clinician: Referring Physician: Ruthe Mannan Treating Physician/Extender: Rudene Re in Treatment: 2 Vital Signs Time Taken: 10:58 Temperature (F): 98.7 Height (in): 68 Pulse (bpm): 107 Weight (lbs): 170 Respiratory Rate (breaths/min): 18 Body Mass Index (BMI): 25.8 Blood Pressure (mmHg): 140/95 Reference Range: 80 - 120 mg / dl Electronic Signature(s) Signed: 12/02/2015 2:47:16 PM By: Elpidio Eric BSN, RN Entered By: Elpidio Eric on 12/02/2015 10:58:46

## 2015-12-04 ENCOUNTER — Encounter (HOSPITAL_COMMUNITY): Payer: Self-pay | Admitting: Internal Medicine

## 2015-12-04 ENCOUNTER — Emergency Department (HOSPITAL_COMMUNITY): Payer: Commercial Managed Care - HMO

## 2015-12-04 DIAGNOSIS — Z888 Allergy status to other drugs, medicaments and biological substances status: Secondary | ICD-10-CM | POA: Diagnosis not present

## 2015-12-04 DIAGNOSIS — R1310 Dysphagia, unspecified: Secondary | ICD-10-CM | POA: Diagnosis present

## 2015-12-04 DIAGNOSIS — Z79899 Other long term (current) drug therapy: Secondary | ICD-10-CM | POA: Diagnosis not present

## 2015-12-04 DIAGNOSIS — Z9049 Acquired absence of other specified parts of digestive tract: Secondary | ICD-10-CM | POA: Diagnosis not present

## 2015-12-04 DIAGNOSIS — Z90710 Acquired absence of both cervix and uterus: Secondary | ICD-10-CM | POA: Diagnosis not present

## 2015-12-04 DIAGNOSIS — Z91040 Latex allergy status: Secondary | ICD-10-CM | POA: Diagnosis not present

## 2015-12-04 DIAGNOSIS — I69169 Other paralytic syndrome following nontraumatic intracerebral hemorrhage affecting unspecified side: Secondary | ICD-10-CM | POA: Diagnosis not present

## 2015-12-04 DIAGNOSIS — A419 Sepsis, unspecified organism: Principal | ICD-10-CM

## 2015-12-04 DIAGNOSIS — E876 Hypokalemia: Secondary | ICD-10-CM | POA: Diagnosis present

## 2015-12-04 DIAGNOSIS — Z87891 Personal history of nicotine dependence: Secondary | ICD-10-CM | POA: Diagnosis not present

## 2015-12-04 DIAGNOSIS — T83511A Infection and inflammatory reaction due to indwelling urethral catheter, initial encounter: Secondary | ICD-10-CM | POA: Diagnosis present

## 2015-12-04 DIAGNOSIS — N390 Urinary tract infection, site not specified: Secondary | ICD-10-CM

## 2015-12-04 DIAGNOSIS — B961 Klebsiella pneumoniae [K. pneumoniae] as the cause of diseases classified elsewhere: Secondary | ICD-10-CM | POA: Diagnosis present

## 2015-12-04 DIAGNOSIS — Y846 Urinary catheterization as the cause of abnormal reaction of the patient, or of later complication, without mention of misadventure at the time of the procedure: Secondary | ICD-10-CM | POA: Diagnosis present

## 2015-12-04 DIAGNOSIS — T83028A Displacement of other indwelling urethral catheter, initial encounter: Secondary | ICD-10-CM | POA: Diagnosis present

## 2015-12-04 DIAGNOSIS — R627 Adult failure to thrive: Secondary | ICD-10-CM

## 2015-12-04 DIAGNOSIS — R32 Unspecified urinary incontinence: Secondary | ICD-10-CM | POA: Diagnosis present

## 2015-12-04 DIAGNOSIS — Z8249 Family history of ischemic heart disease and other diseases of the circulatory system: Secondary | ICD-10-CM | POA: Diagnosis not present

## 2015-12-04 DIAGNOSIS — Z930 Tracheostomy status: Secondary | ICD-10-CM | POA: Diagnosis not present

## 2015-12-04 DIAGNOSIS — G8220 Paraplegia, unspecified: Secondary | ICD-10-CM | POA: Diagnosis present

## 2015-12-04 DIAGNOSIS — Z931 Gastrostomy status: Secondary | ICD-10-CM

## 2015-12-04 DIAGNOSIS — I69120 Aphasia following nontraumatic intracerebral hemorrhage: Secondary | ICD-10-CM | POA: Diagnosis not present

## 2015-12-04 DIAGNOSIS — E870 Hyperosmolality and hypernatremia: Secondary | ICD-10-CM | POA: Diagnosis present

## 2015-12-04 DIAGNOSIS — R4702 Dysphasia: Secondary | ICD-10-CM | POA: Diagnosis present

## 2015-12-04 DIAGNOSIS — L89154 Pressure ulcer of sacral region, stage 4: Secondary | ICD-10-CM | POA: Diagnosis present

## 2015-12-04 DIAGNOSIS — I6930 Unspecified sequelae of cerebral infarction: Secondary | ICD-10-CM

## 2015-12-04 DIAGNOSIS — R7881 Bacteremia: Secondary | ICD-10-CM | POA: Diagnosis not present

## 2015-12-04 DIAGNOSIS — Z515 Encounter for palliative care: Secondary | ICD-10-CM | POA: Diagnosis not present

## 2015-12-04 DIAGNOSIS — D649 Anemia, unspecified: Secondary | ICD-10-CM

## 2015-12-04 DIAGNOSIS — G40909 Epilepsy, unspecified, not intractable, without status epilepticus: Secondary | ICD-10-CM | POA: Diagnosis present

## 2015-12-04 DIAGNOSIS — Z880 Allergy status to penicillin: Secondary | ICD-10-CM | POA: Diagnosis not present

## 2015-12-04 DIAGNOSIS — Z7401 Bed confinement status: Secondary | ICD-10-CM | POA: Diagnosis not present

## 2015-12-04 DIAGNOSIS — I10 Essential (primary) hypertension: Secondary | ICD-10-CM | POA: Diagnosis present

## 2015-12-04 DIAGNOSIS — E669 Obesity, unspecified: Secondary | ICD-10-CM | POA: Diagnosis present

## 2015-12-04 DIAGNOSIS — R569 Unspecified convulsions: Secondary | ICD-10-CM

## 2015-12-04 DIAGNOSIS — E872 Acidosis: Secondary | ICD-10-CM | POA: Diagnosis present

## 2015-12-04 DIAGNOSIS — R159 Full incontinence of feces: Secondary | ICD-10-CM | POA: Diagnosis present

## 2015-12-04 DIAGNOSIS — T83511S Infection and inflammatory reaction due to indwelling urethral catheter, sequela: Secondary | ICD-10-CM | POA: Diagnosis not present

## 2015-12-04 DIAGNOSIS — E860 Dehydration: Secondary | ICD-10-CM | POA: Diagnosis present

## 2015-12-04 LAB — URINALYSIS, ROUTINE W REFLEX MICROSCOPIC
BILIRUBIN URINE: NEGATIVE
Glucose, UA: NEGATIVE mg/dL
Hgb urine dipstick: NEGATIVE
Ketones, ur: NEGATIVE mg/dL
NITRITE: NEGATIVE
PROTEIN: NEGATIVE mg/dL
SPECIFIC GRAVITY, URINE: 1.018 (ref 1.005–1.030)
pH: 5 (ref 5.0–8.0)

## 2015-12-04 LAB — COMPREHENSIVE METABOLIC PANEL
ALBUMIN: 2.6 g/dL — AB (ref 3.5–5.0)
ALK PHOS: 143 U/L — AB (ref 38–126)
ALT: 60 U/L — ABNORMAL HIGH (ref 14–54)
AST: 21 U/L (ref 15–41)
Anion gap: 9 (ref 5–15)
BILIRUBIN TOTAL: 0.4 mg/dL (ref 0.3–1.2)
BUN: 23 mg/dL — AB (ref 6–20)
CALCIUM: 9.3 mg/dL (ref 8.9–10.3)
CO2: 27 mmol/L (ref 22–32)
CREATININE: 0.48 mg/dL (ref 0.44–1.00)
Chloride: 119 mmol/L — ABNORMAL HIGH (ref 101–111)
GFR calc Af Amer: >60 mL/min (ref >60)
GFR calc non Af Amer: >60 mL/min (ref >60)
GLUCOSE: 97 mg/dL (ref 65–99)
Potassium: 3.6 mmol/L (ref 3.5–5.1)
Sodium: 155 mmol/L — ABNORMAL HIGH (ref 135–145)
TOTAL PROTEIN: 6.6 g/dL (ref 6.5–8.1)

## 2015-12-04 LAB — CBC
HEMATOCRIT: 28.7 % — AB (ref 36.0–46.0)
HEMOGLOBIN: 8.8 g/dL — AB (ref 12.0–15.0)
MCH: 30.2 pg (ref 26.0–34.0)
MCHC: 30.7 g/dL (ref 30.0–36.0)
MCV: 98.6 fL (ref 78.0–100.0)
Platelets: 272 10*3/uL (ref 150–400)
RBC: 2.91 MIL/uL — ABNORMAL LOW (ref 3.87–5.11)
RDW: 17 % — AB (ref 11.5–15.5)
WBC: 11.2 10*3/uL — ABNORMAL HIGH (ref 4.0–10.5)

## 2015-12-04 LAB — CBC WITH DIFFERENTIAL/PLATELET
BASOS ABS: 0 10*3/uL (ref 0.0–0.1)
BASOS PCT: 0 %
EOS PCT: 3 %
Eosinophils Absolute: 0.2 10*3/uL (ref 0.0–0.7)
HCT: 29.2 % — ABNORMAL LOW (ref 36.0–46.0)
Hemoglobin: 8.9 g/dL — ABNORMAL LOW (ref 12.0–15.0)
Lymphocytes Relative: 29 %
Lymphs Abs: 2.5 10*3/uL (ref 0.7–4.0)
MCH: 30 pg (ref 26.0–34.0)
MCHC: 30.5 g/dL (ref 30.0–36.0)
MCV: 98.3 fL (ref 78.0–100.0)
MONO ABS: 0.9 10*3/uL (ref 0.1–1.0)
Monocytes Relative: 10 %
Neutro Abs: 4.9 10*3/uL (ref 1.7–7.7)
Neutrophils Relative %: 58 %
PLATELETS: 291 10*3/uL (ref 150–400)
RBC: 2.97 MIL/uL — ABNORMAL LOW (ref 3.87–5.11)
RDW: 16.9 % — AB (ref 11.5–15.5)
WBC: 8.5 10*3/uL (ref 4.0–10.5)

## 2015-12-04 LAB — URINE MICROSCOPIC-ADD ON

## 2015-12-04 LAB — APTT: aPTT: 25 seconds (ref 24–37)

## 2015-12-04 LAB — PROTIME-INR
INR: 1.23 (ref 0.00–1.49)
Prothrombin Time: 15.2 seconds (ref 11.6–15.2)

## 2015-12-04 LAB — VANCOMYCIN, TROUGH: Vancomycin Tr: 9 ug/mL — ABNORMAL LOW (ref 10.0–20.0)

## 2015-12-04 LAB — MRSA PCR SCREENING: MRSA by PCR: NEGATIVE

## 2015-12-04 LAB — I-STAT CG4 LACTIC ACID, ED
Lactic Acid, Venous: 0.99 mmol/L (ref 0.5–2.0)
Lactic Acid, Venous: 1.65 mmol/L (ref 0.5–2.0)

## 2015-12-04 LAB — CREATININE, SERUM
Creatinine, Ser: 0.59 mg/dL (ref 0.44–1.00)
GFR calc Af Amer: >60 mL/min (ref >60)

## 2015-12-04 LAB — LACTIC ACID, PLASMA
LACTIC ACID, VENOUS: 3.2 mmol/L — AB (ref 0.5–2.0)
LACTIC ACID, VENOUS: 2 mmol/L (ref 0.5–2.0)

## 2015-12-04 LAB — PROCALCITONIN: PROCALCITONIN: 0.76 ng/mL

## 2015-12-04 MED ORDER — POLYETHYLENE GLYCOL 3350 17 G PO PACK
17.0000 g | PACK | Freq: Every day | ORAL | Status: DC
  Administered 2015-12-04 – 2015-12-09 (×4): 17 g
  Filled 2015-12-04 (×5): qty 1

## 2015-12-04 MED ORDER — ACETAMINOPHEN 325 MG PO TABS
650.0000 mg | ORAL_TABLET | ORAL | Status: DC | PRN
  Administered 2015-12-07 – 2015-12-08 (×2): 650 mg
  Filled 2015-12-04 (×3): qty 2

## 2015-12-04 MED ORDER — LEVOFLOXACIN IN D5W 750 MG/150ML IV SOLN
750.0000 mg | INTRAVENOUS | Status: DC
  Administered 2015-12-05: 750 mg via INTRAVENOUS
  Filled 2015-12-04: qty 150

## 2015-12-04 MED ORDER — PRO-STAT SUGAR FREE PO LIQD
60.0000 mL | Freq: Two times a day (BID) | ORAL | Status: DC
Start: 2015-12-04 — End: 2015-12-06
  Administered 2015-12-04 – 2015-12-06 (×5): 60 mL
  Filled 2015-12-04 (×5): qty 60

## 2015-12-04 MED ORDER — ENOXAPARIN SODIUM 40 MG/0.4ML ~~LOC~~ SOLN
40.0000 mg | SUBCUTANEOUS | Status: DC
  Administered 2015-12-04 – 2015-12-09 (×6): 40 mg via SUBCUTANEOUS
  Filled 2015-12-04 (×6): qty 0.4

## 2015-12-04 MED ORDER — DEXTROSE 5 % IV SOLN
2.0000 g | Freq: Once | INTRAVENOUS | Status: AC
  Administered 2015-12-04: 2 g via INTRAVENOUS
  Filled 2015-12-04: qty 2

## 2015-12-04 MED ORDER — VANCOMYCIN HCL IN DEXTROSE 1-5 GM/200ML-% IV SOLN
1000.0000 mg | INTRAVENOUS | Status: AC
  Administered 2015-12-04: 1000 mg via INTRAVENOUS
  Filled 2015-12-04: qty 200

## 2015-12-04 MED ORDER — SODIUM CHLORIDE 0.9 % IV BOLUS (SEPSIS)
1000.0000 mL | Freq: Once | INTRAVENOUS | Status: AC
  Administered 2015-12-04: 1000 mL via INTRAVENOUS

## 2015-12-04 MED ORDER — CHOLESTYRAMINE LIGHT 4 G PO PACK
4.0000 g | PACK | Freq: Three times a day (TID) | ORAL | Status: DC
  Administered 2015-12-04 – 2015-12-09 (×16): 4 g via ORAL
  Filled 2015-12-04 (×18): qty 1

## 2015-12-04 MED ORDER — JEVITY 1.5 CAL/FIBER PO LIQD
1000.0000 mL | ORAL | Status: DC
  Administered 2015-12-04 – 2015-12-06 (×3): 1000 mL
  Filled 2015-12-04 (×4): qty 1000

## 2015-12-04 MED ORDER — VITAMIN C 500 MG PO TABS
1000.0000 mg | ORAL_TABLET | Freq: Every day | ORAL | Status: DC
Start: 2015-12-04 — End: 2015-12-09
  Administered 2015-12-04 – 2015-12-09 (×6): 1000 mg
  Filled 2015-12-04 (×6): qty 2

## 2015-12-04 MED ORDER — TRAZODONE HCL 50 MG PO TABS
25.0000 mg | ORAL_TABLET | Freq: Every evening | ORAL | Status: DC | PRN
  Administered 2015-12-04 – 2015-12-08 (×2): 25 mg via ORAL
  Filled 2015-12-04 (×2): qty 1

## 2015-12-04 MED ORDER — VITAMIN D3 25 MCG (1000 UNIT) PO TABS
5000.0000 [IU] | ORAL_TABLET | Freq: Every day | ORAL | Status: DC
  Administered 2015-12-04 – 2015-12-09 (×5): 5000 [IU] via ORAL
  Filled 2015-12-04 (×7): qty 5

## 2015-12-04 MED ORDER — POLYVINYL ALCOHOL 1.4 % OP SOLN
2.0000 [drp] | Freq: Three times a day (TID) | OPHTHALMIC | Status: DC
  Administered 2015-12-04 – 2015-12-09 (×14): 2 [drp] via OPHTHALMIC
  Filled 2015-12-04: qty 15

## 2015-12-04 MED ORDER — RANITIDINE HCL 150 MG/10ML PO SYRP
150.0000 mg | ORAL_SOLUTION | Freq: Two times a day (BID) | ORAL | Status: DC
  Administered 2015-12-04 – 2015-12-09 (×11): 150 mg
  Filled 2015-12-04 (×12): qty 10

## 2015-12-04 MED ORDER — FREE WATER
200.0000 mL | Freq: Four times a day (QID) | Status: DC
  Administered 2015-12-04 – 2015-12-09 (×20): 200 mL via ORAL

## 2015-12-04 MED ORDER — FAMOTIDINE 40 MG/5ML PO SUSR: 20.0000 mg | Freq: Two times a day (BID) | ORAL | Status: DC

## 2015-12-04 MED ORDER — SODIUM CHLORIDE 0.45 % IV SOLN
INTRAVENOUS | Status: DC
  Administered 2015-12-04 – 2015-12-08 (×8): via INTRAVENOUS

## 2015-12-04 MED ORDER — ONDANSETRON HCL 4 MG/2ML IJ SOLN: 4.0000 mg | Freq: Four times a day (QID) | INTRAMUSCULAR | Status: DC | PRN

## 2015-12-04 MED ORDER — LACOSAMIDE 50 MG PO TABS
100.0000 mg | ORAL_TABLET | Freq: Two times a day (BID) | ORAL | Status: DC
  Administered 2015-12-04 – 2015-12-09 (×11): 100 mg via ORAL
  Filled 2015-12-04 (×11): qty 2

## 2015-12-04 MED ORDER — LEVOFLOXACIN IN D5W 750 MG/150ML IV SOLN
750.0000 mg | Freq: Once | INTRAVENOUS | Status: AC
  Administered 2015-12-04: 750 mg via INTRAVENOUS
  Filled 2015-12-04: qty 150

## 2015-12-04 MED ORDER — SODIUM CHLORIDE 0.9 % IV BOLUS (SEPSIS)
500.0000 mL | INTRAVENOUS | Status: AC
Start: 2015-12-04 — End: 2015-12-04
  Administered 2015-12-04: 500 mL via INTRAVENOUS

## 2015-12-04 MED ORDER — VANCOMYCIN HCL IN DEXTROSE 1-5 GM/200ML-% IV SOLN
1000.0000 mg | Freq: Once | INTRAVENOUS | Status: DC
  Filled 2015-12-04: qty 200

## 2015-12-04 MED ORDER — DEXTROSE 5 % IV SOLN
2.0000 g | Freq: Three times a day (TID) | INTRAVENOUS | Status: DC
  Administered 2015-12-04 – 2015-12-05 (×3): 2 g via INTRAVENOUS
  Filled 2015-12-04 (×4): qty 2

## 2015-12-04 MED ORDER — METOPROLOL TARTRATE 50 MG PO TABS
50.0000 mg | ORAL_TABLET | Freq: Two times a day (BID) | ORAL | Status: DC
  Administered 2015-12-04 – 2015-12-09 (×11): 50 mg
  Filled 2015-12-04: qty 1
  Filled 2015-12-04: qty 2
  Filled 2015-12-04: qty 1
  Filled 2015-12-04: qty 2
  Filled 2015-12-04 (×2): qty 1
  Filled 2015-12-04: qty 2
  Filled 2015-12-04: qty 1
  Filled 2015-12-04: qty 2
  Filled 2015-12-04 (×3): qty 1

## 2015-12-04 MED ORDER — VANCOMYCIN HCL IN DEXTROSE 1-5 GM/200ML-% IV SOLN
1000.0000 mg | Freq: Three times a day (TID) | INTRAVENOUS | Status: DC
  Administered 2015-12-04 – 2015-12-06 (×6): 1000 mg via INTRAVENOUS
  Filled 2015-12-04 (×9): qty 200

## 2015-12-04 MED ORDER — LEVETIRACETAM 100 MG/ML PO SOLN
1500.0000 mg | Freq: Two times a day (BID) | ORAL | Status: DC
  Administered 2015-12-04 – 2015-12-09 (×11): 1500 mg via ORAL
  Filled 2015-12-04 (×15): qty 15

## 2015-12-04 MED ORDER — ONDANSETRON HCL 4 MG PO TABS: 4.0000 mg | ORAL_TABLET | Freq: Four times a day (QID) | ORAL | Status: DC | PRN

## 2015-12-04 MED ORDER — ADULT MULTIVITAMIN W/MINERALS CH
1.0000 | ORAL_TABLET | Freq: Every day | ORAL | Status: DC
  Administered 2015-12-04 – 2015-12-09 (×6): 1
  Filled 2015-12-04 (×6): qty 1

## 2015-12-04 MED ORDER — KETOROLAC TROMETHAMINE 30 MG/ML IJ SOLN
30.0000 mg | Freq: Once | INTRAMUSCULAR | Status: AC
  Administered 2015-12-04: 30 mg via INTRAVENOUS
  Filled 2015-12-04: qty 1

## 2015-12-04 MED ORDER — LOPERAMIDE HCL 1 MG/5ML PO LIQD
4.0000 mg | Freq: Four times a day (QID) | ORAL | Status: DC | PRN
  Filled 2015-12-04: qty 20

## 2015-12-04 MED ORDER — FERROUS SULFATE 300 (60 FE) MG/5ML PO SYRP
300.0000 mg | ORAL_SOLUTION | Freq: Two times a day (BID) | ORAL | Status: DC
  Administered 2015-12-04 – 2015-12-09 (×11): 300 mg via ORAL
  Filled 2015-12-04 (×13): qty 5

## 2015-12-04 NOTE — ED Notes (Signed)
Admitting MD at bedside.

## 2015-12-04 NOTE — Progress Notes (Signed)
CRITICAL VALUE ALERT  Critical value received: Gram negative rods in aerobic and anaerobic bottles  Date of notification:  12/04/2015  Time of notification:  1708  Critical value read back:Yes.    Nurse who received alert:  Sharl Ma, RN  MD notified (1st page):  Rama  Time of first page:  1710  Time MD responded:  [phone]

## 2015-12-04 NOTE — Plan of Care (Signed)
Problem: Nutrition: Goal: Adequate nutrition will be maintained Outcome: Progressing Tube feedings have been initiated.  Nutritional supplements are ordered and being provided.

## 2015-12-04 NOTE — Progress Notes (Signed)
ANTIBIOTIC CONSULT NOTE - INITIAL  Pharmacy Consult for Vancomycin, Aztreonam, Levofloxacin Indication: Sepsis  Allergies  Allergen Reactions  . Amlodipine Swelling  . Penicillins Swelling    Has patient had a PCN reaction causing immediate rash, facial/tongue/throat swelling, SOB or lightheadedness with hypotension: Yes Has tolerated cephalosporins in the past Has patient had a PCN reaction causing severe rash involving mucus membranes or skin necrosis: No Has patient had a PCN reaction that required hospitalization No Has patient had a PCN reaction occurring within the last 10 years: No If all of the above answers are "NO", then may proceed with Cephalosporin use.  . Latex Rash    Unknown   . Other Other (See Comments)    Natural Rubber- Unknown     Patient Measurements: Weight: 186 lb 11.7 oz (84.7 kg)  Vital Signs: Temp: 101.9 F (38.8 C) (01/29 0234) Temp Source: Oral (01/29 0234) BP: 111/91 mmHg (01/29 0323) Pulse Rate: 124 (01/29 0323) Intake/Output from previous day:   Intake/Output from this shift:    Labs:  Recent Labs  12/04/15 0320  WBC 8.5  HGB 8.9*  PLT 291  CREATININE 0.48   Estimated Creatinine Clearance: 88.7 mL/min (by C-G formula based on Cr of 0.48).  Recent Labs  12/04/15 0340  VANCOTROUGH 9*     Microbiology: No results found for this or any previous visit (from the past 720 hour(s)).  Medical History: Past Medical History  Diagnosis Date  . Hypertension   . SAH (subarachnoid hemorrhage) (HCC)   . Seizure (HCC)   . Stroke (HCC)   . Tracheostomy status (HCC)   . Tracheomalacia   . DVT (deep venous thrombosis) (HCC)     Medications:  Scheduled:   Infusions:  . aztreonam    . levofloxacin (LEVAQUIN) IV 750 mg (12/04/15 0339)  . sodium chloride 1,000 mL (12/04/15 0342)   Followed by  . sodium chloride     Assessment:  49 yr presents to ED with foley catheter issues.  PTA patient on Vancomycin  IV q12h, on a 6 week  course of therapy.  Foley catheter was replaced and was pt was to be discharged, but found to have fever of 101.9 F and increased heart rate  Pharmacy consulted to dose Vancomycin, Levofloxacin and Aztreonam for sepsis  Vancomycin trough level ordered STAT as patient was receiving med PTA.  Last reported dose from facility reported as received on 1/28 @ 9am  Vancomycin level @ 0340 = 9 mcg/ml  CrCl ~ 10ml/min  Goal of Therapy:  Vancomycin trough level 15-20 mcg/ml  Plan:  Measure antibiotic drug levels at steady state Follow up culture results  Vancomycin 1gm IV q8h Aztreonam 2gm IV q8h Levofloxacin  IV q24h  Narelle Schoening, Joselyn Glassman, PharmD 12/04/2015,4:30 AM

## 2015-12-04 NOTE — Progress Notes (Signed)
Initial Nutrition Assessment  INTERVENTION:   TF recommendations: Initiate Jevity 1.5 @ 20 ml/hr via PEG and increase by 10 ml every 4 hours to goal rate of 45 ml/hr.  60 ml Prostat BID.    Recommend free water flushes of 240 ml every 6 hours.  Tube feeding regimen provides 2020 kcal (96% of needs), 129 grams of protein, and 1780 ml of H2O.   RD to continue to monitor  NUTRITION DIAGNOSIS:   Increased nutrient needs related to wound healing as evidenced by estimated needs.  GOAL:   Patient will meet greater than or equal to 90% of their needs  MONITOR:   Labs, Weight trends, TF tolerance, Skin, I & O's ,GOC  REASON FOR ASSESSMENT:   Consult Assessment of nutrition requirement/status (TF recommendations)  ASSESSMENT:   50 y.o. 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 A Foley catheter was replaced in the ED and the patient was going to be transferred back to her SNF when she spiked a fever to 102 and a heart rate of 122. History is obtained from ER notes and her nursing home records as the patient is nonverbal and unable to provide any additional history.  Pt in room with no family at bedside. RN at bedside and was preparing to start TF. Pt nonverbal.  Pt was followed by clinical nutrition staff during previous admission (12/08-12/16/16). Per notes, PEG was placed and pt was started on Jevity 1.5 with goal rate of 40 ml/hr and 60 ml Prostat BID.   Per weight history pt has lost 14 lb since 12/21 (8% wt loss x 1 month, significant for time frame).  RD estimated needs are higher than they were 1 month ago d/t recent weight loss and stage IV sacral ulcer. TF recommendations provided above. Palliative care consult placed for updated GOC  discussion.  Patient with no sign of depletion of muscle mass or body fat.  Labs reviewed: Elevated Na, BUN  Diet Order:  Diet NPO time specified  Skin:  Reviewed, no issues  Last BM:  PTA  Height:   Ht Readings from Last 1 Encounters:  12/04/15  (1.651 m)    Weight:   Wt Readings from Last 1 Encounters:  12/04/15 172 lb 13.5 oz (78.4 kg)    Ideal Body Weight:  56.8 kg  BMI:  Body mass index is 28.76 kg/(m^2).  Estimated Nutritional Needs:   Kcal:  2100-2300  Protein:  115-125g  Fluid:  2L/day  EDUCATION NEEDS:   No education needs identified at this time  Tilda Franco, MS, RD, LDN Pager: [phone] After Hours Pager: [phone]

## 2015-12-04 NOTE — Consult Note (Signed)
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 Wound bed: red, non-granulating Drainage (amount, consistency, odor) small amount serous exudate Periwound:intact with evidence of previous wound healing. Dressing procedure/placement/frequency: I have initiated NPWT today at continuous negative pressure and used one piece of black foam to obliterate the dead space and one piece of black foam to provide a bridge to the left hip, although this may not be required in future. Patient turns from side to side and is not at risk for lying on TRAC pad if it were to be placed over the ulcer. I used a small piece of pectin from an ostomy skin barrier ring to fill in the defect at the apex of the gluteal crease and achieved an immediate seal.  Patient is incontinent of feces; has an indwelling urinary catheter for urinary incontinence.  Should she be incontinent of stool and it undermine the NPWT dressing, we may need to consider the efficacy of NPWT until a time when liquid stools are not an issue.  IF that were to be the case, a saline dressing, changed twice daily would also be appropriate. I will also replace her SNF provided, bilateral foam heel protectors with Prevalon Boots as they are superior for pressure redistribution to this vulnerable area. WOC nursing team will not follow, but will remain available to this patient, the nursing and medical teams.  Please re-consult if needed. Thanks, Ladona Mow, MSN, RN, GNP, [place]  Pager# [phone]

## 2015-12-04 NOTE — ED Provider Notes (Signed)
CSN: [id number]     Arrival date & [id number]  2131 History   First MD Initiated Contact with Patient 12/03/15 2136     Chief Complaint  Patient presents with  . Vascular Access Problem     (Consider location/radiation/quality/duration/timing/severity/associated sxs/prior Treatment) HPI Patient presents to the emergency department with Foley catheter issues.  The patient has had an 55 French Foley catheter that come out 3 times this week.  The patient is nonverbal and cannot give me any history the nursing home reports no fevers, nausea, vomiting or loss of consciousness Past Medical History  Diagnosis Date  . Hypertension   . SAH (subarachnoid hemorrhage) (HCC)   . Seizure (HCC)   . Stroke (HCC)   . Tracheostomy status (HCC)   . Tracheomalacia   . DVT (deep venous thrombosis) [facility])    Past Surgical History  Procedure Laterality Date  . Abdominal hysterectomy    . Cholecystectomy    . Flexible sigmoidoscopy N/A 10/25/2015    Procedure: FLEXIBLE SIGMOIDOSCOPY;  Surgeon: Carman Ching, MD;  Location: [facility] ENDOSCOPY;  Service: Endoscopy;  Laterality: N/A;   Family History  Problem Relation Age of Onset  . Heart disease Mother 63    MI   Social History  Substance Use Topics  . Smoking status: Former Smoker    Types: Cigarettes  . Smokeless tobacco: Never Used     Comment: trying to quit-2 cigarettes daily  . Alcohol Use: No     Comment: Very seldom   OB History    No data available     Review of Systems   Level V caveat applies due to noncommunicative state Allergies  Amlodipine; Penicillins; Latex; and Other  Home Medications   Prior to Admission medications   Medication Sig Start Date End Date Taking? Authorizing Provider  acetaminophen (TYLENOL) 325 MG tablet Take 2 tablets (650 mg total) by mouth daily. Patient taking differently: Take 650 mg by mouth daily as needed for moderate pain or fever. 2 tablets given every day, may take additional tablets PRN for  fever above 100 F 03/22/15  Yes Hollice Espy, MD  Amino Acids-Protein Hydrolys (FEEDING SUPPLEMENT, PRO-STAT SUGAR FREE 64,) LIQD Place 60 mLs into feeding tube 2 (two) times daily. 10/26/15  Yes Jeralyn Bennett, MD  Ascorbic Acid (VITAMIN C) 1000 MG tablet Give 1,000 mg by tube daily.    Yes Historical Provider, MD  carboxymethylcellulose (REFRESH PLUS) 0.5 % SOLN Place 2 drops into both eyes 3 (three) times daily.   Yes Historical Provider, MD  Cholecalciferol (VITAMIN D3) 5000 UNITS TABS Take 5,000 Units by mouth daily with breakfast.    Yes Historical Provider, MD  cholestyramine light (PREVALITE) 4 G packet Take 4 g by mouth 3 (three) times daily. mix in 8 oz. Of Liquid   Yes Historical Provider, MD  famotidine (PEPCID) 40 MG/5ML suspension Take 2.5 mLs (20 mg total) by mouth every 12 (twelve) hours. 03/22/15  Yes Hollice Espy, MD  ferrous sulfate 300 (60 FE) MG/5ML syrup Take 5 mLs (300 mg total) by mouth 2 (two) times daily with a meal. 09/06/15  Yes Kathlen Mody, MD  Heparin Lock Flush (HEPARIN, PORCINE, LOCK FLUSH IV) Inject into the vein every 12 (twelve) hours.   Yes Historical Provider, MD  Lacosamide (VIMPAT) 100 MG TABS Take 100 mg by mouth 2 (two) times daily.   Yes Historical Provider, MD  levETIRAcetam (KEPPRA) 100 MG/ML solution Take 15 mLs (1,500 mg total) by mouth 2 (two)  times daily. 10/26/15  Yes Jeralyn Bennett, MD  lisinopril (PRINIVIL,ZESTRIL) 10 MG tablet Give 10 mg by tube daily.    Yes Historical Provider, MD  loperamide (IMODIUM A-D) 2 MG tablet Take 4 mg by mouth 4 (four) times daily as needed for diarrhea or loose stools.   Yes Historical Provider, MD  metoprolol (LOPRESSOR) 50 MG tablet Take 1 tablet (50 mg total) by mouth 2 (two) times daily. Patient taking differently: 50 mg by PEG Tube route 2 (two) times daily.  10/26/15  Yes Jeralyn Bennett, MD  Multiple Vitamin (MULTIVITAMIN WITH MINERALS) TABS tablet Give 1 tablet by tube daily with breakfast.    Yes  Historical Provider, MD  Nutritional Supplements (FEEDING SUPPLEMENT, JEVITY 1.5 CAL/FIBER,) LIQD Place 1,000 mLs into feeding tube continuous. 10/26/15  Yes Jeralyn Bennett, MD  polyethylene glycol [facility]) packet Take 17 g by mouth daily. Patient taking differently: Give 17 g by tube daily.  10/26/15  Yes Jeralyn Bennett, MD  traZODone (DESYREL) 50 MG tablet Take 25 mg by mouth at bedtime as needed for sleep.    Yes Historical Provider, MD  vancomycin 1,250 mg in sodium chloride 0.9 % 250 mL Inject 1,250 mg into the vein daily. Patient taking differently: Inject 1,250 mg into the vein every 12 (twelve) hours.  10/26/15  Yes Jeralyn Bennett, MD  Water For Irrigation, Sterile (FREE WATER) SOLN Take 200 mLs by mouth every 8 (eight) hours. 10/26/15  Yes Jeralyn Bennett, MD  cefTRIAXone 1 g in dextrose 5 % 50 mL Inject 1 g into the vein daily. Patient not taking: Reported on 12/03/2015 10/26/15   Jeralyn Bennett, MD   BP 112/80 mmHg  Pulse 111  Temp(Src) 98.2 F (36.8 C)  Resp 20  SpO2 98% Physical Exam  Constitutional: She is oriented to person, place, and time. She appears well-developed and well-nourished. No distress.  HENT:  Head: Normocephalic and atraumatic.  Cardiovascular: Normal rate, regular rhythm and normal heart sounds.  Exam reveals no gallop and no friction rub.   No murmur heard. Pulmonary/Chest: Effort normal and breath sounds normal.  Neurological: She is alert and oriented to person, place, and time. She exhibits normal muscle tone. Coordination normal.  Skin: Skin is warm and dry. No rash noted. No erythema.  Nursing note and vitals reviewed.   ED Course  Procedures (including critical care time) Labs Review Labs Reviewed - No data to display  Imaging Review No results found. I have personally reviewed and evaluated these images and lab results as part of my medical decision-making.  Replace the Foley catheter with a 14 French Foley and the patient will need  follow-up with her doctor for further evaluation  MDM   Final diagnoses:  None        Charlestine Night, PA-C 12/04/15 0014  Richardean Canal, MD 12/04/15 [phone]

## 2015-12-04 NOTE — Progress Notes (Signed)
CRITICAL VALUE ALERT  Critical value received:  Lactic Acid 3.2  Date of notification:  12/04/2015  Time of notification:  1002  Critical value read back:Yes.    Nurse who received alert:  Sharl Ma  MD notified (1st page):  Rama  Time of first page:  1002  Time of second page: 1030  Time MD responded:  1040

## 2015-12-04 NOTE — ED Notes (Signed)
started the 20 min Timer 06:40 am

## 2015-12-04 NOTE — H&P (Addendum)
History and Physical:    [name]   ZOX:[id number] DOB: 02-Jun-1966 DOA: 12/03/2015  Referring MD/provider: Ebbie Ridge, PA-C PCP: Ruthe Mannan, MD   Chief Complaint: Dislodged Foley catheter, fever  History of Present Illness:   [name] is an 50 y.o. 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 A Foley catheter was replaced in the ED and the patient was going to be transferred back to her SNF when she spiked a fever to 102 and a heart rate of 122. History is obtained from ER notes and her nursing home records as the patient is nonverbal and unable to provide any additional history.  ROS:   Review of Systems  Unable to perform ROS: medical condition    Past Medical History:   Past Medical History  Diagnosis Date  . Hypertension   . SAH (subarachnoid hemorrhage) (HCC)   . Seizure (HCC)   . Stroke (HCC)   . Tracheostomy status (HCC)   . Tracheomalacia   . DVT (deep venous thrombosis) (HCC)   . Obesity   . Occipital neuralgia   . HCAP (healthcare-associated pneumonia)   . DVT (deep venous thrombosis) (HCC)   . Tracheitis   . Ruptured aneurysm of artery (HCC)   . Clostridium difficile colitis   . 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 Stage IV pressure ulcer of sacral region (HCC)   . Thrombocytopenia (HCC)   . Abscess, sacrum (HCC) 08/28/2015    Past Surgical History:   Past Surgical History  Procedure Laterality Date  . Abdominal hysterectomy    . Cholecystectomy    . Flexible sigmoidoscopy N/A 10/25/2015    Procedure: FLEXIBLE SIGMOIDOSCOPY;  Surgeon: Carman Ching, MD;  Location: [facility] ENDOSCOPY;  Service: Endoscopy;  Laterality: N/A;    Social History:   Social History   Social History  . Marital  Status: Single    Spouse Name: N/A  . Number of Children: N/A  . Years of Education: N/A   Occupational History  . Not on file.   Social History Main Topics  . Smoking status: Former Smoker    Types: Cigarettes  . Smokeless tobacco: Never Used     Comment: trying to quit-2 cigarettes daily  . Alcohol Use: No     Comment: Very seldom  . Drug Use: No  . Sexual Activity: No   Other Topics Concern  . Not on file   Social History Narrative   Resident of [place]. Bed bound.    Family history:   Family History  Problem Relation Age of Onset  . Heart disease Mother 44    MI    Allergies   Amlodipine; Penicillins; Latex; and Other  Current Medications:   Prior to Admission medications   Medication Sig Start Date End Date Taking? Authorizing Provider  acetaminophen (TYLENOL) 325 MG tablet Take 2 tablets (650 mg total) by mouth daily. Patient taking differently: Take 650 mg by mouth daily as needed for moderate pain or fever. 2 tablets given every day, may take additional tablets PRN for fever above 100 F 03/22/15  Yes Hollice Espy, MD  Amino Acids-Protein Hydrolys (FEEDING SUPPLEMENT, PRO-STAT SUGAR FREE 64,) LIQD Place 60 mLs into feeding tube 2 (two) times daily. 10/26/15  Yes  Jeralyn Bennett, MD  Ascorbic Acid (VITAMIN C) 1000 MG tablet Give 1,000 mg by tube daily.    Yes Historical Provider, MD  carboxymethylcellulose (REFRESH PLUS) 0.5 % SOLN Place 2 drops into both eyes 3 (three) times daily.   Yes Historical Provider, MD  Cholecalciferol (VITAMIN D3) 5000 UNITS TABS Take 5,000 Units by mouth daily with breakfast.    Yes Historical Provider, MD  cholestyramine light (PREVALITE) 4 G packet Take 4 g by mouth 3 (three) times daily. mix in 8 oz. Of Liquid   Yes Historical Provider, MD  famotidine (PEPCID) 40 MG/5ML suspension Take 2.5 mLs (20 mg total) by mouth every 12 (twelve) hours. 03/22/15  Yes Hollice Espy, MD  ferrous sulfate 300 (60 FE) MG/5ML syrup Take 5  mLs (300 mg total) by mouth 2 (two) times daily with a meal. 09/06/15  Yes Kathlen Mody, MD  Heparin Lock Flush (HEPARIN, PORCINE, LOCK FLUSH IV) Inject into the vein every 12 (twelve) hours.   Yes Historical Provider, MD  Lacosamide (VIMPAT) 100 MG TABS Take 100 mg by mouth 2 (two) times daily.   Yes Historical Provider, MD  levETIRAcetam (KEPPRA) 100 MG/ML solution Take 15 mLs (1,500 mg total) by mouth 2 (two) times daily. 10/26/15  Yes Jeralyn Bennett, MD  lisinopril (PRINIVIL,ZESTRIL) 10 MG tablet Give 10 mg by tube daily.    Yes Historical Provider, MD  loperamide (IMODIUM A-D) 2 MG tablet Take 4 mg by mouth 4 (four) times daily as needed for diarrhea or loose stools.   Yes Historical Provider, MD  metoprolol (LOPRESSOR) 50 MG tablet Take 1 tablet (50 mg total) by mouth 2 (two) times daily. Patient taking differently: 50 mg by PEG Tube route 2 (two) times daily.  10/26/15  Yes Jeralyn Bennett, MD  Multiple Vitamin (MULTIVITAMIN WITH MINERALS) TABS tablet Give 1 tablet by tube daily with breakfast.    Yes Historical Provider, MD  Nutritional Supplements (FEEDING SUPPLEMENT, JEVITY 1.5 CAL/FIBER,) LIQD Place 1,000 mLs into feeding tube continuous. 10/26/15  Yes Jeralyn Bennett, MD  polyethylene glycol [facility]) packet Take 17 g by mouth daily. Patient taking differently: Give 17 g by tube daily.  10/26/15  Yes Jeralyn Bennett, MD  traZODone (DESYREL) 50 MG tablet Take 25 mg by mouth at bedtime as needed for sleep.    Yes Historical Provider, MD  vancomycin 1,250 mg in sodium chloride 0.9 % 250 mL Inject 1,250 mg into the vein daily. Patient taking differently: Inject 1,250 mg into the vein every 12 (twelve) hours.  10/26/15  Yes Jeralyn Bennett, MD  Water For Irrigation, Sterile (FREE WATER) SOLN Take 200 mLs by mouth every 8 (eight) hours. 10/26/15  Yes Jeralyn Bennett, MD  cefTRIAXone 1 g in dextrose 5 % 50 mL Inject 1 g into the vein daily. Patient not taking: Reported on 12/03/2015 10/26/15    Jeralyn Bennett, MD    Physical Exam:   Filed Vitals:   12/04/15 0245 12/04/15 0323 12/04/15 0447 12/04/15 0658  BP:  111/91 112/83 104/73  Pulse:  124 119 137  Temp:      TempSrc:      Resp:  16 40 50  Weight: 84.7 kg (186 lb 11.7 oz)     SpO2:  95% 94% 97%     Physical Exam: Blood pressure 104/73, pulse 137, temperature 101.9 F (38.8 C), temperature source Oral, resp. rate 50, weight 84.7 kg (186 lb 11.7 oz), SpO2 97 %. Gen: No acute distress. Nonverbal. Head: Normocephalic, atraumatic. Eyes:  PERRL, EOMI, sclerae nonicteric. Mouth: Oropharynx could not be examined as the patient would not open her mouth. Neck: Supple, no thyromegaly, no lymphadenopathy, no jugular venous distention. Chest: Lungs diminished in the bases. CV: Heart sounds are tachycardic but regular. Abdomen: Soft, nontender, nondistended with normal active bowel sounds. Extremities: Extremities show bilateral foot drop and trace edema. PICC line right antecubital space with erythema and some mild drainage at insertion site. Skin: Mildly diaphoretic.Marland Kitchen Neuro: Alert; paraplegia, does not follow commands. Psych: Mood and affect flat.   Data Review:    Labs: Basic Metabolic Panel:  Recent Labs Lab 12/04/15 0320  NA 155*  K 3.6  CL 119*  CO2 27  GLUCOSE 97  BUN 23*  CREATININE 0.48  CALCIUM 9.3   Liver Function Tests:  Recent Labs Lab 12/04/15 0320  AST 21  ALT 60*  ALKPHOS 143*  BILITOT 0.4  PROT 6.6  ALBUMIN 2.6*   CBC:  Recent Labs Lab 12/04/15 0320  WBC 8.5  NEUTROABS 4.9  HGB 8.9*  HCT 29.2*  MCV 98.3  PLT 291    Radiographic Studies: Dg Chest Port 1 View  12/04/2015  CLINICAL DATA:  Fever.  Foley catheter came out 3 times this week. EXAM: PORTABLE CHEST 1 VIEW COMPARISON:  10/13/2015 FINDINGS: Shallow inspiration. Normal heart size and pulmonary vascularity. No focal airspace disease or consolidation in the lungs. No blunting of costophrenic angles. No pneumothorax.  Mediastinal contours appear intact. Right PICC catheter with tip over the low SVC region. Degenerative changes in the shoulders. IMPRESSION: No active disease. Electronically Signed   By: Burman Nieves M.D.   On: 12/04/2015 03:10   *I have personally reviewed the images above*  EKG: No EKG performed.   Assessment/Plan:   Principal Problem:   Sepsis (HCC) secondary to complicated UTI - Sepsis order set utilized.  - Meets 2 or more SIRS criteria (Temp > 100.9 <96.8; HR >90; RR >20; PaCO2<32; WBC >12K<4K or >10 %bands AND has evidence of acute organ failure (lactic acidosis, oliguria, ALI, ARDS, coagulopathy/DIC, AMS, hypotension/shock) - This patient is at high risk of poor outcomes with a SOFA score of 2 (at least 2 of the following clinical criteria: respiratory rate of 22/min or greater, altered mentation, or systolic blood pressure of 100 mm Hg or less. - Source thought to be from complicated UTI given chronic indwelling Foley, CXR clear. - Send blood and urine cultures. - WBC is 8.5, lactic acid is 1.65.  Check pro-calcitoninin. - Fluid volume resuscitate with 30 mg/kg using weight based algorithm per sepsis order set. - Start targeted antibiotics with vancomycin, Levaquin and aztreonam, based on suspected source of infection. - Remove PICC line and culture tip.  Active Problems:   Obesity / dysphasia / percutaneous enteral gastrostomy tube dependent - Dietitian consultation for tube feed recommendations. - Continue protein supplement.    Seizures (HCC) - Continue Keppra and Vimpat.    Hypernatremia - Increase free water boluses to 200 mL every 6 hours and give maintenance IV fluids with half-normal saline.    Decubitus ulcer of sacral region, stage 4 (HCC) - Wound care nurse evaluation.    Adult failure to thrive / history of stroke with current residual effects - Evaluated by the palliative care team 10/2015 with family decision to continue aggressive care/full CODE  STATUS. - Re-consult palliative care given poor prognosis.    Normocytic anemia - Continue iron supplement.    DVT prophylaxis - Lovenox ordered.  Code Status / Family Communication /  Disposition Plan:   Code Status: Full. Family Communication: No family currently at the bedside. Son is primary contact, [name], [phone] Disposition Plan: Home when stable.  Attestation regarding necessity of inpatient status:   The appropriate admission status for this patient is INPATIENT. Inpatient status is judged to be reasonable and necessary in order to provide the required intensity of service to ensure the patient's safety. The patient's presenting symptoms, physical exam findings, and initial radiographic and laboratory data in the context of their chronic comorbidities is felt to place them at high risk for further clinical deterioration. Furthermore, it is not anticipated that the patient will be medically stable for discharge from the hospital within 2 midnights of admission. The following factors support the admission status of inpatient.   -The patient's presenting symptoms include AMS. - The worrisome physical exam findings include tachycardia, tachypnea and fever. - The initial radiographic and laboratory data are worrisome because of hypernatremia. - The chronic co-morbidities include bedbound status/paraplegia, seizure d/o, h/o stroke with residual effects. - Patient requires inpatient status due to high intensity of service, high risk for further deterioration and high frequency of surveillance required. - I certify that at the point of admission it is my clinical judgment that the patient will require inpatient hospital care spanning beyond 2 midnights from the point of admission.   Time spent: 70 minutes.  [name] [facility] Pager [phone] Cell: [phone]   If 7PM-7AM, please contact night-coverage [email or web address] Password [facility] 12/04/2015, 7:49  AM

## 2015-12-04 NOTE — ED Provider Notes (Addendum)
Pt was seen earlier in the evening by PA Lawyer for replacement of a foley catheter.  No other complaints per the nursing home report.  Hx limited in that patient is non verbal.  Pt was getting ready for discharge and now noted to have a fever up to 102 and a heart rate of 122.    Pt is resting in the bed.  No distress.  Will check labs, xray, give fluid bolus and abx.  Labs show hypernatremia.   Remains tachycardic.  Tachypnea.  Possible UTI.  CXR without pna.  ABx administered.  Will admit to stepdown  Linwood Dibbles, MD 12/04/15 4098  Lactic acid level 1.65  Linwood Dibbles, MD 12/04/15 [phone]

## 2015-12-05 ENCOUNTER — Encounter (HOSPITAL_COMMUNITY): Payer: Self-pay | Admitting: Internal Medicine

## 2015-12-05 DIAGNOSIS — T83511S Infection and inflammatory reaction due to indwelling urethral catheter, sequela: Secondary | ICD-10-CM

## 2015-12-05 DIAGNOSIS — D72829 Elevated white blood cell count, unspecified: Secondary | ICD-10-CM | POA: Diagnosis present

## 2015-12-05 DIAGNOSIS — R7881 Bacteremia: Secondary | ICD-10-CM

## 2015-12-05 DIAGNOSIS — I10 Essential (primary) hypertension: Secondary | ICD-10-CM | POA: Diagnosis present

## 2015-12-05 DIAGNOSIS — T83511A Infection and inflammatory reaction due to indwelling urethral catheter, initial encounter: Secondary | ICD-10-CM

## 2015-12-05 DIAGNOSIS — N390 Urinary tract infection, site not specified: Secondary | ICD-10-CM | POA: Diagnosis present

## 2015-12-05 DIAGNOSIS — R627 Adult failure to thrive: Secondary | ICD-10-CM | POA: Diagnosis present

## 2015-12-05 DIAGNOSIS — A419 Sepsis, unspecified organism: Secondary | ICD-10-CM | POA: Diagnosis present

## 2015-12-05 DIAGNOSIS — Z515 Encounter for palliative care: Secondary | ICD-10-CM

## 2015-12-05 LAB — URINE CULTURE: Culture: NO GROWTH

## 2015-12-05 LAB — BASIC METABOLIC PANEL
Anion gap: 9 (ref 5–15)
BUN: 20 mg/dL (ref 6–20)
CALCIUM: 8.4 mg/dL — AB (ref 8.9–10.3)
CO2: 22 mmol/L (ref 22–32)
CREATININE: 0.44 mg/dL (ref 0.44–1.00)
Chloride: 113 mmol/L — ABNORMAL HIGH (ref 101–111)
GFR calc Af Amer: >60 mL/min (ref >60)
Glucose, Bld: 114 mg/dL — ABNORMAL HIGH (ref 65–99)
Potassium: 3.1 mmol/L — ABNORMAL LOW (ref 3.5–5.1)
SODIUM: 144 mmol/L (ref 135–145)

## 2015-12-05 LAB — CBC
HCT: 26.3 % — ABNORMAL LOW (ref 36.0–46.0)
Hemoglobin: 8.2 g/dL — ABNORMAL LOW (ref 12.0–15.0)
MCH: 30.1 pg (ref 26.0–34.0)
MCHC: 31.2 g/dL (ref 30.0–36.0)
MCV: 96.7 fL (ref 78.0–100.0)
PLATELETS: 269 10*3/uL (ref 150–400)
RBC: 2.72 MIL/uL — ABNORMAL LOW (ref 3.87–5.11)
RDW: 16.9 % — AB (ref 11.5–15.5)
WBC: 6.6 10*3/uL (ref 4.0–10.5)

## 2015-12-05 MED ORDER — POTASSIUM CHLORIDE 10 MEQ/100ML IV SOLN: 10.0000 meq | INTRAVENOUS | Status: DC

## 2015-12-05 MED ORDER — DEXTROSE 5 % IV SOLN
1.0000 g | Freq: Three times a day (TID) | INTRAVENOUS | Status: DC
  Administered 2015-12-05 – 2015-12-06 (×3): 1 g via INTRAVENOUS
  Filled 2015-12-05 (×5): qty 1

## 2015-12-05 MED ORDER — CETYLPYRIDINIUM CHLORIDE 0.05 % MT LIQD
7.0000 mL | Freq: Two times a day (BID) | OROMUCOSAL | Status: DC
  Administered 2015-12-05 – 2015-12-09 (×10): 7 mL via OROMUCOSAL

## 2015-12-05 MED ORDER — POTASSIUM CHLORIDE 20 MEQ/15ML (10%) PO SOLN
30.0000 meq | Freq: Once | ORAL | Status: AC
  Administered 2015-12-05: 30 meq via ORAL
  Filled 2015-12-05: qty 30

## 2015-12-05 NOTE — Care Management Note (Signed)
Case Management Note  Patient Details  Name: [name] MRN: [id number] Date of Birth: 1966-06-03  Subjective/Objective:  50 y/o f admitted w/Sepsis.From SNF-[facility]-PEG-TF,paraplegia,sacral decub. Palliative meeting-GOC.                  Action/Plan:d/c plan SNF   Expected Discharge Date:                  Expected Discharge Plan:  Skilled Nursing Facility  In-House Referral:  Clinical Social Work  Discharge planning Services  CM Consult  Post Acute Care Choice:    Choice offered to:     DME Arranged:    DME Agency:     HH Arranged:    HH Agency:     Status of Service:  In process, will continue to follow  Medicare Important Message Given:    Date Medicare IM Given:    Medicare IM give by:    Date Additional Medicare IM Given:    Additional Medicare Important Message give by:     If discussed at Long Length of Stay Meetings, dates discussed:    Additional Comments:  Lanier Clam, RN 12/05/2015, 12:01 PM

## 2015-12-05 NOTE — Progress Notes (Signed)
Thank you for consulting the Palliative Medicine Team at [facility] to meet your patient's and family's needs.   The reason that you asked [place] to see your patient is  For Establishing GOC  We have left voice messages with son and sister in attempt to schedule a GOC meeting, await call back.  The Surrogate decision make is:  Son/ Sander Radon  Your patient is unable to participate:  Lorinda Creed NP  Palliative Medicine Team Team Phone # [phone] Pager [phone]

## 2015-12-05 NOTE — Progress Notes (Signed)
Pharmacy Antibiotic Follow-up Note  [name] is a 50 y.o. year-old female admitted on 12/03/2015.  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  Assessment/Plan:  After discussion with Dr. Elisabeth Pigeon, aztreonam and levofloxacin will be changed to cefepime based on cultures and/or susceptibilities.  Vancomycin will continue as ordered  Will check VT tomorrow AM.   Temp (24hrs), Avg:99.4 F (37.4 C), Min:98 F (36.7 C), Max:100.6 F (38.1 C)   Recent Labs Lab 12/04/15 0320 12/04/15 0900 12/05/15 0350  WBC 8.5 11.2* 6.6    Recent Labs Lab 12/04/15 0320 12/04/15 0900 12/05/15 0350  CREATININE 0.48 0.59 0.44   Estimated Creatinine Clearance: 91.5 mL/min (by C-G formula based on Cr of 0.44).    Allergies  Allergen Reactions  . Amlodipine Swelling  . Penicillins Swelling    - Tolerates cefepime, cefuroxime, Keflex, Ceftaz Has patient had a PCN reaction causing immediate rash, facial/tongue/throat swelling, SOB or lightheadedness with hypotension: Yes Has patient had a PCN reaction causing severe rash involving mucus membranes or skin necrosis: No Has patient had a PCN reaction that required hospitalization No Has patient had a PCN reaction occurring within the last 10 years: No If all of the above answers are "NO", then may proceed with Cephalosporin use.  . Latex Rash    Unknown   . Other Other (See Comments)    Natural Rubber- Unknown     Antimicrobials this admission: 1/29 vanc >> 1/29 aztreonam >>  1/30 1/29 levaquin >> 1/30 1/30 Cefepime >>  Levels/dose changes this admission: 1/29 VT =9 (see summary)  Microbiology results: 1/29 blood: 1/2 GNR, ID pending 1/29 urine: collected 1/29 cath tip Cx: IP  Today, 12/05/2015: Temp: afebrile overnight, now 99.7 WBC: wnl Renal: SCr stable/low; CrCl 91 CG; UOP adequate  Thank you for allowing pharmacy to be a part of this patient's  care.  Bernadene Person, PharmD, BCPS Pager: [phone] 12/05/2015, 10:20 AM

## 2015-12-05 NOTE — Progress Notes (Addendum)
Patient ID: [name], female   DOB: 27-Jan-1966, 50 y.o.   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 This is a third time this has happened in the past week prior to this admission. Foley was replaced in ED and initial thought was that patient will go back to skilled nursing facility but she spiked a fever of 49 F and had tachycardia of 49. She was placed on broad-spectrum antibiotics for sepsis thought to be UTI.  Transfer to telemetry floor today.  Assessment/Plan:    Sepsis ([place]) secondary to complicated UTI / urinary tract infection due to chronic indwelling Foley catheter / Leukocytosis  - Sepsis criteria met on the admission with fever, tachycardia, tachypnea, leukocytosis. Lactic acid was within normal limits. Pro calcitonin was slightly elevated at 0.76. - Source of infection thought to be UTI. Urinalysis on the admission showed trace leukocytes and few bacteria. - Because patient has chronic indwelling Foley catheter she was started on broad-spectrum antibiotics, aztreonam, Levaquin and vancomycin. Will change antibiotics to cefepime instead of aztreonam and Levaquin starting today. - Urine culture is pending. - Patient is hemodynamically stable and can be transferred to telemetry floor today.   Active Problems:   Gram negative bacteremia - Blood cultures obtained at the time of the admission are growing gram-negative rods in one of the sets. The other set is pending. - We'll obtain blood cultures 12/07/2015 to ensure clearance of bacteremia -  Continue cefepime and vancomycin   Obesity / dysphasia / percutaneous enteral gastrostomy tube dependent - Appreciate dietitian assisting with the tube feedings recommendations      Essential hypertension - Continue metoprolol 50 mg twice daily or 2   Seizures ([place]) - Continue Keppra and Vimpat. - No reports of seizures    Hypokalemia - Due to acute infection - Supplemented    Hypernatremia - Secondary to dehydration, tube feedings - Continue water flushes   Decubitus ulcer of sacral region, stage 4 (HCC) - Chronic, non-healing pressure injury (Stage 4) to sacrum - Measurement:7.5cm x 5.5cm x 2.5cm with undermining from 9-12 o'clock measuring 3cm at 9 o'clock and 2cm at 12 o'clock. - Drainage (amount, consistency, odor) small amount serous exudate - Dressing procedure/placement/frequency: NPWT today at 167mHg continuous negative pressure and used one piece of black foam to obliterate the dead space and one piece of black foam to provide a bridge to the left hip, although this may not be required in future. Patient turns from side to side and is not at risk for lying on TRAC pad if it were to be placed over the ulcer. Wound care RN used a small piece of pectin from an ostomy skin barrier ring to fill in the defect at the apex of the gluteal crease and achieved an immediate seal. Patient is incontinent of feces; has an indwelling urinary catheter for urinary incontinence. Replaced SNF provided bilateral foam heel protectors with Prevalon Boots as they are superior for pressure redistribution to this vulnerable area.   Adult failure to thrive / history of stroke with current residual effects - Evaluated by the palliative care team 10/2015 with family decision to continue  aggressive care/full CODE STATUS. - Palliative care again consulted on this admission and we appreciate their input   Normocytic anemia - Continue iron supplement.  DVT Prophylaxis  - Lovenox subQ   Code  Status: Full.  Family Communication:  Family not at the bedside this am Disposition Plan: transfer to telemetry today   IV access:  Peripheral IV  Procedures and diagnostic studies:    Dg Chest Port 1 View 12-14-2015   No active disease. Electronically Signed   By: Lucienne Capers M.D.   On: Dec 14, 2015 03:10   Medical Consultants:  PCT  Other Consultants:  WOC PT  IAnti-Infectives:   Vanco December 14, 2015 --> Aztreonam and Levaquin 2015-12-14 --> 12/05/2015 Cefepime 12/05/2015 -->   [name], MD  [facility] Pager [phone]  Time spent in minutes: 25 minutes  If 7PM-7AM, please contact night-coverage [email or web address] Password [facility] 12/05/2015, 9:33 AM   LOS: 1 day    HPI/Subjective: No acute overnight events. No seizures.   Objective: Filed Vitals:   12/05/15 0400 12/05/15 0600 12/05/15 0647 12/05/15 0800  BP: 168/110 145/101  150/99  Pulse: 106 119  112  Temp: 99.6 F (37.6 C)   99.7 F (37.6 C)  TempSrc: Oral   Axillary  Resp: 23 17  32  Height:      Weight:   183 lb 3.2 oz (83.1 kg)   SpO2: 100% 100%  99%    Intake/Output Summary (Last 24 hours) at 12/05/15 0933 Last data filed at 12/05/15 0900  Gross per 24 hour  Intake   3800 ml  Output   1925 ml  Net   1875 ml    Exam:   General:  Pt is alert,  not in acute distress  Cardiovascular: Regular rate and rhythm, S1/S2 (+)  Respiratory: Clear to auscultation bilaterally, no wheezing, no crackles, no rhonchi  Abdomen: Soft, non tender, non distended, bowel sounds present, PEG in place, foley in place   Extremities: Has Prevalon boots, pulses DP and PT palpable bilaterally  Neuro: Grossly nonfocal  Data Reviewed: Basic Metabolic Panel:  Recent Labs Lab 2015/12/14 0320 2015/12/14 0900 12/05/15 0350  NA 155*  --  144  K 3.6  --  3.1*  CL 119*  --  113*  CO2 27  --  22  GLUCOSE 97  --  114*  BUN 23*  --  20  CREATININE 0.48 0.59 0.44  CALCIUM 9.3  --  8.4*   Liver Function  Tests:  Recent Labs Lab 12/14/15 0320  AST 21  ALT 60*  ALKPHOS 143*  BILITOT 0.4  PROT 6.6  ALBUMIN 2.6*   No results for input(s): LIPASE, AMYLASE in the last 168 hours. No results for input(s): AMMONIA in the last 168 hours. CBC:  Recent Labs Lab 12-14-15 0320 14-Dec-2015 0900 12/05/15 0350  WBC 8.5 11.2* 6.6  NEUTROABS 4.9  --   --   HGB 8.9* 8.8* 8.2*  HCT 29.2* 28.7* 26.3*  MCV 98.3 98.6 96.7  PLT 291 272 269   Cardiac Enzymes: No results for input(s): CKTOTAL, CKMB, CKMBINDEX, TROPONINI in the last 168 hours. BNP: Invalid input(s): POCBNP CBG: No results for input(s): GLUCAP in the last 168 hours.  Recent Results (from the past 240 hour(s))  Blood Culture (routine x 2)     Status: None (Preliminary result)   Collection Time: 14-Dec-2015  3:15 AM  Result Value Ref Range Status   Specimen Description BLOOD PICC LINE  Final   Special Requests BOTTLES DRAWN  AEROBIC AND ANAEROBIC 5ML  Final   Culture  Setup Time   Final    GRAM NEGATIVE RODS IN BOTH AEROBIC AND ANAEROBIC BOTTLES CRITICAL RESULT CALLED TO, READ BACK BY AND VERIFIED WITH: B.MCNABB,RN 12/04/15 @1712  BY V.WILKINS CONFIRMED BY K.WOOTEN Performed at [facility]    Culture PENDING  Incomplete   Report Status PENDING  Incomplete  Blood Culture (routine x 2)     Status: None (Preliminary result)   Collection Time: 12/04/15  9:04 AM  Result Value Ref Range Status   Specimen Description BLOOD LEFT ANTECUBITAL  Final   Special Requests   Final    BOTTLES DRAWN AEROBIC AND ANAEROBIC 5CC Performed at [facility]    Culture PENDING  Incomplete   Report Status PENDING  Incomplete  MRSA PCR Screening     Status: None   Collection Time: 12/04/15 10:37 AM  Result Value Ref Range Status   MRSA by PCR NEGATIVE NEGATIVE Final    Comment:        The GeneXpert MRSA Assay (FDA approved for NASAL specimens only), is one component of a comprehensive MRSA colonization surveillance program. It is  not intended to diagnose MRSA infection nor to guide or monitor treatment for MRSA infections.      Scheduled Meds: . antiseptic oral rinse  7 mL Mouth Rinse BID  . cholecalciferol  5,000 Units Oral Q breakfast  . cholestyramine light  4 g Oral TID  . enoxaparin (LOVENOX) injection  40 mg Subcutaneous Q24H  . feeding supplement (JEVITY 1.5 CAL/FIBER)  1,000 mL Per Tube Q24H  . feeding supplement (PRO-STAT SUGAR FREE 64)  60 mL Per Tube BID  . ferrous sulfate  300 mg Oral BID WC  . free water  200 mL Oral Q6H  . lacosamide  100 mg Oral BID  . levETIRAcetam  1,500 mg Oral BID  . metoprolol  50 mg Per Tube BID  . multivitamin with minerals  1 tablet Per Tube Q breakfast  . polyethylene glycol  17 g Per Tube Daily  . polyvinyl alcohol  2 drop Both Eyes TID  . potassium chloride  30 mEq Oral Once  . ranitidine  150 mg Per Tube BID  . vancomycin  1,000 mg Intravenous Q8H  . vitamin C  1,000 mg Per Tube Daily   Continuous Infusions: . sodium chloride 100 mL/hr at 12/05/15 0900

## 2015-12-06 DIAGNOSIS — D72829 Elevated white blood cell count, unspecified: Secondary | ICD-10-CM

## 2015-12-06 DIAGNOSIS — I10 Essential (primary) hypertension: Secondary | ICD-10-CM

## 2015-12-06 DIAGNOSIS — E876 Hypokalemia: Secondary | ICD-10-CM

## 2015-12-06 LAB — CBC
HCT: 27 % — ABNORMAL LOW (ref 36.0–46.0)
Hemoglobin: 8.6 g/dL — ABNORMAL LOW (ref 12.0–15.0)
MCH: 29.7 pg (ref 26.0–34.0)
MCHC: 31.9 g/dL (ref 30.0–36.0)
MCV: 93.1 fL (ref 78.0–100.0)
PLATELETS: 261 10*3/uL (ref 150–400)
RBC: 2.9 MIL/uL — AB (ref 3.87–5.11)
RDW: 16.2 % — AB (ref 11.5–15.5)
WBC: 6.5 10*3/uL (ref 4.0–10.5)

## 2015-12-06 LAB — BASIC METABOLIC PANEL
ANION GAP: 9 (ref 5–15)
BUN: 15 mg/dL (ref 6–20)
CALCIUM: 8.3 mg/dL — AB (ref 8.9–10.3)
CO2: 21 mmol/L — ABNORMAL LOW (ref 22–32)
CREATININE: 0.44 mg/dL (ref 0.44–1.00)
Chloride: 107 mmol/L (ref 101–111)
GLUCOSE: 110 mg/dL — AB (ref 65–99)
Potassium: 3.3 mmol/L — ABNORMAL LOW (ref 3.5–5.1)
Sodium: 137 mmol/L (ref 135–145)

## 2015-12-06 LAB — MAGNESIUM: Magnesium: 1.8 mg/dL (ref 1.7–2.4)

## 2015-12-06 LAB — VANCOMYCIN, TROUGH: VANCOMYCIN TR: 39 ug/mL — AB (ref 10.0–20.0)

## 2015-12-06 MED ORDER — VANCOMYCIN HCL IN DEXTROSE 750-5 MG/150ML-% IV SOLN: 750.0000 mg | Freq: Two times a day (BID) | INTRAVENOUS | Status: DC

## 2015-12-06 MED ORDER — JEVITY 1.5 CAL/FIBER PO LIQD
1000.0000 mL | ORAL | Status: DC
  Administered 2015-12-06 – 2015-12-08 (×4): 1000 mL
  Filled 2015-12-06 (×5): qty 1000

## 2015-12-06 MED ORDER — DEXTROSE 5 % IV SOLN
1.0000 g | Freq: Three times a day (TID) | INTRAVENOUS | Status: DC
  Filled 2015-12-06: qty 1

## 2015-12-06 MED ORDER — SODIUM CHLORIDE 0.9 % IV SOLN
500.0000 mg | Freq: Four times a day (QID) | INTRAVENOUS | Status: DC
  Administered 2015-12-06 – 2015-12-09 (×12): 500 mg via INTRAVENOUS
  Filled 2015-12-06 (×13): qty 500

## 2015-12-06 MED ORDER — PRO-STAT SUGAR FREE PO LIQD
60.0000 mL | Freq: Every day | ORAL | Status: DC
  Administered 2015-12-07 – 2015-12-09 (×3): 60 mL
  Filled 2015-12-06 (×3): qty 60

## 2015-12-06 MED ORDER — DEXTROSE 5 % IV SOLN
2.0000 g | Freq: Three times a day (TID) | INTRAVENOUS | Status: DC
  Filled 2015-12-06: qty 2

## 2015-12-06 NOTE — Progress Notes (Signed)
NUTRITION NOTE  Page sent to Dr. Elisabeth Pigeon concerning TF regimen. Received call back and MD provided verbal orders for RD to change TF regimen. Will order: Jevity 1.5 @ 60 mL/hr with 60 mL Prostat once/day which will provide 2360 kcal, 122 grams of protein, and 1094 mL free water.  Estimated Nutritional Needs:  Kcal: 1610-9604 (27-30 kcal/jg) Protein: 120-130 grams Fluid: 2L/day  RD will continue to follow per protocol.   Trenton Gammon, RD, LDN Inpatient Clinical Dietitian Pager # [phone] After hours/weekend pager # [phone]

## 2015-12-06 NOTE — Progress Notes (Signed)
Pharmacy Antibiotic Note  [name] is a 50 y.o. female admitted on 12/03/2015 with ESBL Klebsiella bacteremia.  Pharmacy has been consulted for Primaxin dosing.  Plan: Start Primaxin 500 mg IV q6h.  Height:  (165.1 cm) Weight: 182 lb 8.7 oz (82.8 kg) IBW/kg (Calculated) : 57  Temp (24hrs), Avg:99.3 F (37.4 C), Min:99.1 F (37.3 C), Max:99.8 F (37.7 C)   Recent Labs Lab 12/04/15 0320 12/04/15 0328 12/04/15 0340 12/04/15 0644 12/04/15 0859 12/04/15 0900 12/04/15 1217 12/05/15 0350 12/06/15 0544  WBC 8.5  --   --   --   --  11.2*  --  6.6 6.5  CREATININE 0.48  --   --   --   --  0.59  --  0.44 0.44  LATICACIDVEN  --  0.99  --  1.65 3.2*  --  2.0  --   --   VANCOTROUGH  --   --  9*  --   --   --   --   --  39*    Estimated Creatinine Clearance: 91.4 mL/min (by C-G formula based on Cr of 0.44).    Allergies  Allergen Reactions  . Amlodipine Swelling  . Penicillins Swelling    - Tolerates cefepime, cefuroxime, Keflex, Ceftaz Has patient had a PCN reaction causing immediate rash, facial/tongue/throat swelling, SOB or lightheadedness with hypotension: Yes Has patient had a PCN reaction causing severe rash involving mucus membranes or skin necrosis: No Has patient had a PCN reaction that required hospitalization No Has patient had a PCN reaction occurring within the last 10 years: No If all of the above answers are "NO", then may proceed with Cephalosporin use.  . Latex Rash    Unknown   . Other Other (See Comments)    Natural Rubber- Unknown     Antimicrobials this admission: 12/7 vanc >> 1/31 1/29 aztreonam >> 1/30 1/29 levaquin >> 1/30 1/30 Cefepime >> 1/31 1/31 Primaxin >>  Dose adjustments this admission: 1/29 VT =9 about 19 hours after 1250 mg dose (pt was on 1250 mg q12h PTA), changed to 1g q8h 1/31 VT = 39 on 1g q8h, changed to 750 mg q12h  Microbiology results: 1/31 BCx: collected 1/29 blood: 1/2 ESBL Klebsiella 1/29 urine: NGF 1/29  cath tip Cx: IP  Thank you for allowing pharmacy to be a part of this patient's care.  Clance Boll 12/06/2015 1:02 PM

## 2015-12-06 NOTE — Progress Notes (Signed)
Pharmacy Antibiotic Follow-up Note  [name] is a 50 y.o. year-old female admitted on 12/03/2015.  The patient is currently on day 3 of broad spectrum antibiotics, currently Vancomycin and Cefepime for sepsis.   Assessment/Plan:  Blood cultures 1 of 2 collections growing GNR.  F/u results to narrow.  Vancomycin trough level high this morning. Adjust vancomycin to 750 mg IV q12h.  If blood culture does not grow any GPC at 48 hours, would consider discontinuing vancomycin since patient has completed vancomycin course for decub ulcer wound infection (per December 2016 discharge summary, 6 week course scheduled to end 12/02/15).  Continue Cefepime 1g IV q8h.   Temp (24hrs), Avg:99.3 F (37.4 C), Min:99.1 F (37.3 C), Max:99.8 F (37.7 C)   Recent Labs Lab 12/04/15 0320 12/04/15 0900 12/05/15 0350 12/06/15 0544  WBC 8.5 11.2* 6.6 6.5    Recent Labs Lab 12/04/15 0320 12/04/15 0900 12/05/15 0350 12/06/15 0544  CREATININE 0.48 0.59 0.44 0.44   Estimated Creatinine Clearance: 91.4 mL/min (by C-G formula based on Cr of 0.44).    Allergies  Allergen Reactions  . Amlodipine Swelling  . Penicillins Swelling    - Tolerates cefepime, cefuroxime, Keflex, Ceftaz Has patient had a PCN reaction causing immediate rash, facial/tongue/throat swelling, SOB or lightheadedness with hypotension: Yes Has patient had a PCN reaction causing severe rash involving mucus membranes or skin necrosis: No Has patient had a PCN reaction that required hospitalization No Has patient had a PCN reaction occurring within the last 10 years: No If all of the above answers are "NO", then may proceed with Cephalosporin use.  . Latex Rash    Unknown   . Other Other (See Comments)    Natural Rubber- Unknown     Antimicrobials this admission: 12/7 Vancomycin >>  1/29 Aztreonam >> 1/30 1/29 Levaquin >> 1/30 1/30 Cefepime >>  Levels/dose changes this admission: 1/29 VT =9 about 19 hours after 1250 mg  dose (pt was on 1250 mg q12h PTA), changed to 1g q8h 1/31 VT = 39 on 1g q8h, changed to 750 mg q12h  Microbiology results: 1/29 blood: 1/2 GNR 1/29 urine: NGF 1/29 cath tip Cx: IP  Thank you for allowing pharmacy to be a part of this patient's care.  Clance Boll PharmD 12/06/2015 8:09 AM

## 2015-12-06 NOTE — Progress Notes (Signed)
Nutrition Follow-up  DOCUMENTATION CODES:   Not applicable  INTERVENTION:  - Recommend Jevity 1.5 @ 60 mL/hr with 60 mL Prostat once/day which will provide 2360, 122 grams of protein, and 1094 mL free water - Continue current free water flush - RD will continue to monitor for needs  NUTRITION DIAGNOSIS:   Increased nutrient needs related to wound healing as evidenced by estimated needs. -ongoing  GOAL:   Patient will meet greater than or equal to 90% of their needs -unmet for kcal, met for protein with current TF regimen  MONITOR:   Labs, Weight trends, TF tolerance, Skin, I & O's  ASSESSMENT:   50 y.o. 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 A Foley catheter was replaced in the ED and the patient was going to be transferred back to her SNF when she spiked a fever to 102 and a heart rate of 122. History is obtained from ER notes and her nursing home records as the patient is nonverbal and unable to provide any additional history.  1/31 Pt is nonverbal/aphasic. Needs have been re-estimated based on stage 4 ulcer with associated wound vac. Pt currently receiving Jevity 1.5 @ 40 mL/hr with 60 mL Prostat BID and 200 mL free water every 6 hours. This regimen is providing 1840 kcal (81% minimum estimated kcal needs), 121 grams protein, and 1530 mL free water.   Palliative Care note states pending call back from son to discuss [place]. Will monitor for decision regarding this. Medications reviewed. Labs reviewed; K: 3.3 mmol/L, Ca: 8.3 mg/dL.   1/29 - Pt in room with no family at bedside.  - RN at bedside and was preparing to start TF.  - Pt nonverbal. - Pt was followed by clinical nutrition staff during previous admission (12/08-12/16/16). - Per notes, PEG  was placed and pt was started on Jevity 1.5 with goal rate of 40 ml/hr and 60 ml Prostat BID.  - Per weight history pt has lost 14 lb since 12/21 (8% wt loss x 1 month, significant for time frame). - RD estimated needs are higher than they were 1 month ago d/t recent weight loss and stage IV sacral ulcer.  - TF recommendations provided above. - Palliative care consult placed for updated [place] discussion. - Patient with no sign of depletion of muscle mass or body fat.    Diet Order:  Diet NPO time specified  Skin:  Wound (see comment) (Stage 4 sacral pressure ulcer with wound vac)  Last BM:  1/31  Height:   Ht Readings from Last 1 Encounters:  12/04/15 5' 5"  (1.651 m)    Weight:   Wt Readings from Last 1 Encounters:  12/06/15 182 lb 8.7 oz (82.8 kg)    Ideal Body Weight:  56.8 kg  BMI:  Body mass index is 30.38 kg/(m^2).  Estimated Nutritional Needs:   Kcal:  8756-4332 (27-30 kcal/jg)  Protein:  120-130 grams  Fluid:  2L/day  EDUCATION NEEDS:   No education needs identified at this time     Jarome Matin, RD, LDN Inpatient Clinical Dietitian Pager # [phone] After hours/weekend pager # [phone]

## 2015-12-06 NOTE — Consult Note (Signed)
  Consult is for review of medical treatment options, clarification of goals of care and end of life issues, disposition and options, and  emotional support and symptom recommendations as indicated.  Patient's sister returned my call today, PMT was attempting to schedule a family meeting for a GOC meeting.  This patient and family has meet at least twice in the past to discuss current medical situation.  I myself meet with patient's son Joseph Berkshire and patient's sister [name] in  October 2016.   Today I spoke with Malachi Bonds. I provided space and opportunity for [name] to express her thoughts and feeling and frustrations regarding her sister's medical situation, emotional support offered.  The conversations involve discussion regarding  advanced directives;  concepts specific to code status, artifical feeding and hydration, continued IV antibiotics and rehospitalization is covered.   Values and goals of care important to patient and family are attempted to be elicited.  Again today and in  past conversation  Concepts specific to mortality, limitations of medical interventions and reality of care delivery systems in SNFs.  The difference between a aggressive medical intervention path  and a palliative comfort care path for this patient is had.  Concept of failure to thrive was had.   Natural trajectory and expectations at EOL were discussed.  Questions and concerns addressed. Family encouraged to call with questions or concerns.  PMT will continue to support holistically.  At this time family  desires continued medical interventions to prolong life.  Recommend Palliative Medicine to follow at SNF  Total time in conversation at the bedside was 50  minutes   Time in 1230  Time out 1330   Greater than 50 % of the time spent in counseling and coordination of care  Lorinda Creed NP  Palliative Medicine Team Team Phone # [phone] Pager [phone]

## 2015-12-06 NOTE — [place] FL2 (Signed)
[place] MEDICAID FL2 LEVEL OF CARE SCREENING TOOL     IDENTIFICATION  Patient Name: [name] Birthdate: 11-Jun-1966 Sex: female Admission Date (Current Location): 12/03/2015  [place] and [place] Number:  Haynes Bast [id number] n Facility and Address:  [facility],  [address], [place] [address]      Provider Number: [id number]  Attending Physician Name and Address:  Alison Murray, MD  Relative Name and Phone Number:       Current Level of Care: Hospital Recommended Level of Care: Skilled Nursing Facility Prior Approval Number:    Date Approved/Denied:   PASRR Number: [id number]  Discharge Plan: SNF    Current Diagnoses: Patient Active Problem List   Diagnosis Date Noted  . Bacteremia due to Klebsiella pneumoniae 12/06/2015  . Sepsis secondary to UTI (HCC) 12/05/2015  . UTI (urinary tract infection) due to urinary indwelling Foley catheter (HCC) 12/05/2015  . Leukocytosis 12/05/2015  . FTT (failure to thrive) in adult 12/05/2015  . Benign essential HTN 12/05/2015  . Hypokalemia 12/05/2015  . Normocytic anemia 12/04/2015  . History of stroke with current residual effects 12/04/2015  . S/P percutaneous endoscopic gastrostomy (PEG) tube placement (HCC) 12/04/2015  . Palliative care encounter 08/29/2015  . Decubitus ulcer of sacral region, stage 4 (HCC) 08/28/2015  . Hx of ischemic right ACA stroke 05/06/2015  . History of ischemic left ACA stroke 05/06/2015  . Dysphagia   . Hypernatremia 03/16/2015  . 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)                       Personal Care Assistance Level of Assistance  Total care       Total Care Assistance: Maximum assistance   Functional Limitations Info  Speech [employer])   Hearing Info: Adequate Speech Info: Impaired    SPECIAL CARE FACTORS FREQUENCY                       Contractures Contractures Info: Not present    Additional Factors Info  Isolation Precautions Code Status Info: FULL Allergies Info: Amlodipine, Penicillins, Latex, Other     Isolation Precautions Info: Contact Precautions     Current Medications (12/06/2015):  This is the current hospital active medication list Current Facility-Administered Medications  Medication Dose Route Frequency Provider Last Rate Last Dose  . 0.45 % sodium chloride infusion   Intravenous Continuous Maryruth Bun Rama, MD 100 mL/hr at 12/05/15 2333    . acetaminophen (TYLENOL) tablet 650 mg  650 mg Per Tube Q4H PRN Maryruth Bun Rama, MD      . antiseptic oral rinse (CPC / CETYLPYRIDINIUM CHLORIDE 0.05%) solution 7 mL  7 mL Mouth Rinse BID Maryruth Bun Rama, MD   7 mL at 12/06/15 1107  . cholecalciferol (VITAMIN D) tablet 5,000 Units  5,000 Units Oral Q breakfast Maryruth Bun Rama, MD   5,000 Units at 12/05/15 0751  . cholestyramine light (PREVALITE) packet 4 g  4 g Oral TID Christina P  Rama, MD   4 g at 12/06/15 1106  . enoxaparin (LOVENOX) injection 40 mg  40 mg Subcutaneous Q24H Maryruth Bun Rama, MD   40 mg at 12/06/15 1107  . feeding supplement (JEVITY 1.5 CAL/FIBER) liquid 1,000 mL  1,000 mL Per Tube Continuous Anderson Malta Ostheim, RD 50 mL/hr at 12/06/15 1300 1,000 mL at 12/06/15 1300  . [START ON 12/07/2015] feeding supplement (PRO-STAT SUGAR FREE 64) liquid 60 mL  60 mL Per Tube Daily Anderson Malta Ostheim, RD      . ferrous sulfate 300 (60 Fe) MG/5ML syrup 300 mg  300 mg Oral BID WC Maryruth Bun Rama, MD   300 mg at 12/06/15 1103  . free water 200 mL   200 mL Oral Q6H Christina P Rama, MD   200 mL at 12/06/15 0900  . imipenem-cilastatin (PRIMAXIN) 500 mg in sodium chloride 0.9 % 100 mL IVPB  500 mg Intravenous Q6H Maryanna Shape Runyon, RPH   500 mg at 12/06/15 1343  . lacosamide (VIMPAT) tablet 100 mg  100 mg Oral BID Maryruth Bun Rama, MD   100 mg at 12/06/15 1107  . levETIRAcetam (KEPPRA) 100 MG/ML solution 1,500 mg  1,500 mg Oral BID Maryruth Bun Rama, MD   1,500 mg at 12/06/15 1105  . loperamide (IMODIUM) 1 MG/5ML solution 4 mg  4 mg Oral QID PRN Maryruth Bun Rama, MD      . metoprolol tartrate (LOPRESSOR) tablet 50 mg  50 mg Per Tube BID Maryruth Bun Rama, MD   50 mg at 12/06/15 1107  . multivitamin with minerals tablet 1 tablet  1 tablet Per Tube Q breakfast Maryruth Bun Rama, MD   1 tablet at 12/06/15 1104  . ondansetron (ZOFRAN) tablet 4 mg  4 mg Oral Q6H PRN Christina P Rama, MD       Or  . ondansetron (ZOFRAN) injection 4 mg  4 mg Intravenous Q6H PRN Christina P Rama, MD      . polyethylene glycol (MIRALAX / GLYCOLAX) packet 17 g  17 g Per Tube Daily Maryruth Bun Rama, MD   17 g at 12/04/15 1053  . polyvinyl alcohol (LIQUIFILM TEARS) 1.4 % ophthalmic solution 2 drop  2 drop Both Eyes TID Maryruth Bun Rama, MD   2 drop at 12/06/15 1108  . ranitidine (ZANTAC) 150 MG/10ML syrup 150 mg  150 mg Per Tube BID Maryruth Bun Rama, MD   150 mg at 12/06/15 1105  . traZODone (DESYREL) tablet 25 mg  25 mg Oral QHS PRN Maryruth Bun Rama, MD   25 mg at 12/04/15 2225  . vitamin C (ASCORBIC ACID) tablet 1,000 mg  1,000 mg Per Tube Daily Maryruth Bun Rama, MD   1,000 mg at 12/06/15 1103     Discharge Medications: Please see discharge summary for a list of discharge medications.  Relevant Imaging Results:  Relevant Lab Results:   Additional Information SSN: [id number]  Scharlene Gloss, Student-SW [phone]

## 2015-12-06 NOTE — Progress Notes (Signed)
BSW Intern continuing to follow.  BSW Intern contact pt sister, [name], via telephone regarding the only bed offer of [facility] SNF. Pt sister seemed displeased with option. Pt sister to discuss with pt son regarding facility decision.   CSW continuing to follow.

## 2015-12-06 NOTE — Clinical Social Work Note (Signed)
Clinical Social Work Assessment  Patient Details  Name: [name] MRN: [id number] Date of Birth: Feb 18, 1966  Date of referral:  12/06/15               Reason for consult:  Facility Placement                Permission sought to share information with:    Permission granted to share information::     Name::        Agency::     Relationship::     Contact Information:     Housing/Transportation Living arrangements for the past 2 months:  [place] of Information:  Other (Comment Required) ([name], Sister) Patient Interpreter Needed:  None Criminal Activity/Legal Involvement Pertinent to Current Situation/Hospitalization:  No - Comment as needed Significant Relationships:  Adult Children, Siblings Lives with:  Facility Resident Do you feel safe going back to the place where you live?  Yes Need for family participation in patient care:  Yes (Comment)  Care giving concerns:  Pt is total care admitted from [facility].    Social Worker assessment / plan:  CSW received consult for facility resident. BSW Intern met with pt and pt sister, Meghan, at bedside. BSW Intern introduced self and explained role. Pt unable to participate is assessment. BSW Intern inquired about pt returning to [facility] after hospitalization. Pt sister is not pleased with care at [facility] due to pt returning to hospital for septic wound. Pt sister discussed pt's time at [facility] and [facility] in the past. Pt sister not pleased with care at those facilities as well. Pt sister interested in pursuing other facilities for pt. Pt sister expressed interest in [place] and [facility].   BSW Intern completed FL2 and conducted a [employer]. BSW Intern to contact [facility] and [facility] regarding availability.   CSW continuing to follow.   Employment status:  Disabled (Comment on whether or not currently receiving Disability) Insurance information:   Medicaid [place] PT Recommendations:  No Follow Up Information / Referral to community resources:  [place]  Patient/Family's Response to care:  Pt non-verbal and does not respond to commands. Pt is total care. Pt sister thanked Engineer, water for help with searching other options for pt.   Patient/Family's Understanding of and Emotional Response to Diagnosis, Current Treatment, and Prognosis:  Pt sister aware of pt medical state and level of care needed for pt.   Emotional Assessment Appearance:  Appears stated age Attitude/Demeanor/Rapport:  Unable to Assess Affect (typically observed):  Unable to Assess Orientation:   (Non-verbal, Unable to follow commands but responds to voice) Alcohol / Substance use:  Not Applicable Psych involvement (Current and /or in the community):  No (Comment)  Discharge Needs  Concerns to be addressed:  Discharge Planning Concerns Readmission within the last 30 days:  Yes Current discharge risk:  None Barriers to Discharge:  Continued Medical Work up   [employer], Student-SW 12/06/2015, 2:49 PM

## 2015-12-06 NOTE — NC FL2 (Deleted)
[place] MEDICAID FL2 LEVEL OF CARE SCREENING TOOL     IDENTIFICATION  Patient Name: [name] Birthdate: Aug 01, 1966 Sex: female Admission Date (Current Location): 12/03/2015  [place] and [place] Number:  Haynes Bast [id number] n Facility and Address:  [facility],  [address] [place] [address]      Provider Number: [id number]  Attending Physician Name and Address:  Alison Murray, MD  Relative Name and Phone Number:       Current Level of Care: Hospital Recommended Level of Care: Skilled Nursing Facility Prior Approval Number:    Date Approved/Denied:   PASRR Number: [id number]  Discharge Plan: SNF    Current Diagnoses: Patient Active Problem List   Diagnosis Date Noted  . Bacteremia due to Klebsiella pneumoniae 12/06/2015  . Sepsis secondary to UTI (HCC) 12/05/2015  . UTI (urinary tract infection) due to urinary indwelling Foley catheter (HCC) 12/05/2015  . Leukocytosis 12/05/2015  . FTT (failure to thrive) in adult 12/05/2015  . Benign essential HTN 12/05/2015  . Hypokalemia 12/05/2015  . Normocytic anemia 12/04/2015  . History of stroke with current residual effects 12/04/2015  . S/P percutaneous endoscopic gastrostomy (PEG) tube placement (HCC) 12/04/2015  . Palliative care encounter 08/29/2015  . Decubitus ulcer of sacral region, stage 4 (HCC) 08/28/2015  . Hx of ischemic right ACA stroke 05/06/2015  . History of ischemic left ACA stroke 05/06/2015  . Dysphagia   . Hypernatremia 03/16/2015  . 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)                       Personal Care Assistance Level of Assistance  Total care       Total Care Assistance: Maximum assistance   Functional Limitations Info  Speech [employer])   Hearing Info: Adequate Speech Info: Impaired    SPECIAL CARE FACTORS FREQUENCY                       Contractures Contractures Info: Not present    Additional Factors Info  Code Status, Allergies Code Status Info: FULL Allergies Info: Amlodipine, Penicillins, Latex, Other           Current Medications (12/06/2015):  This is the current hospital active medication list Current Facility-Administered Medications  Medication Dose Route Frequency Provider Last Rate Last Dose  . 0.45 % sodium chloride infusion   Intravenous Continuous Maryruth Bun Rama, MD 100 mL/hr at 12/05/15 2333    . acetaminophen (TYLENOL) tablet 650 mg  650 mg Per Tube Q4H PRN Maryruth Bun Rama, MD      . antiseptic oral rinse (CPC / CETYLPYRIDINIUM CHLORIDE 0.05%) solution 7 mL  7 mL Mouth Rinse BID Maryruth Bun Rama, MD   7 mL at 12/06/15 1107  . cholecalciferol (VITAMIN D) tablet 5,000 Units  5,000 Units Oral Q breakfast Maryruth Bun Rama, MD   5,000 Units at 12/05/15 0751  . cholestyramine light (PREVALITE) packet 4 g  4 g Oral TID Maryruth Bun Rama, MD  4 g at 12/06/15 1106  . enoxaparin (LOVENOX) injection 40 mg  40 mg Subcutaneous Q24H Maryruth Bun Rama, MD   40 mg at 12/06/15 1107  . feeding supplement (JEVITY 1.5 CAL/FIBER) liquid 1,000 mL  1,000 mL Per Tube Continuous Anderson Malta Ostheim, RD 50 mL/hr at 12/06/15 1300 1,000 mL at 12/06/15 1300  . [START ON 12/07/2015] feeding supplement (PRO-STAT SUGAR FREE 64) liquid 60 mL  60 mL Per Tube Daily Anderson Malta Ostheim, RD      . ferrous sulfate 300 (60 Fe) MG/5ML syrup 300 mg  300 mg Oral BID WC Maryruth Bun Rama, MD   300 mg at 12/06/15 1103  . free water 200 mL  200 mL Oral Q6H Christina P Rama, MD   200  mL at 12/06/15 0900  . imipenem-cilastatin (PRIMAXIN) 500 mg in sodium chloride 0.9 % 100 mL IVPB  500 mg Intravenous Q6H Maryanna Shape Runyon, RPH   500 mg at 12/06/15 1343  . lacosamide (VIMPAT) tablet 100 mg  100 mg Oral BID Maryruth Bun Rama, MD   100 mg at 12/06/15 1107  . levETIRAcetam (KEPPRA) 100 MG/ML solution 1,500 mg  1,500 mg Oral BID Maryruth Bun Rama, MD   1,500 mg at 12/06/15 1105  . loperamide (IMODIUM) 1 MG/5ML solution 4 mg  4 mg Oral QID PRN Maryruth Bun Rama, MD      . metoprolol tartrate (LOPRESSOR) tablet 50 mg  50 mg Per Tube BID Maryruth Bun Rama, MD   50 mg at 12/06/15 1107  . multivitamin with minerals tablet 1 tablet  1 tablet Per Tube Q breakfast Maryruth Bun Rama, MD   1 tablet at 12/06/15 1104  . ondansetron (ZOFRAN) tablet 4 mg  4 mg Oral Q6H PRN Christina P Rama, MD       Or  . ondansetron (ZOFRAN) injection 4 mg  4 mg Intravenous Q6H PRN Christina P Rama, MD      . polyethylene glycol (MIRALAX / GLYCOLAX) packet 17 g  17 g Per Tube Daily Maryruth Bun Rama, MD   17 g at 12/04/15 1053  . polyvinyl alcohol (LIQUIFILM TEARS) 1.4 % ophthalmic solution 2 drop  2 drop Both Eyes TID Maryruth Bun Rama, MD   2 drop at 12/06/15 1108  . ranitidine (ZANTAC) 150 MG/10ML syrup 150 mg  150 mg Per Tube BID Maryruth Bun Rama, MD   150 mg at 12/06/15 1105  . traZODone (DESYREL) tablet 25 mg  25 mg Oral QHS PRN Maryruth Bun Rama, MD   25 mg at 12/04/15 2225  . vitamin C (ASCORBIC ACID) tablet 1,000 mg  1,000 mg Per Tube Daily Maryruth Bun Rama, MD   1,000 mg at 12/06/15 1103     Discharge Medications: Please see discharge summary for a list of discharge medications.  Relevant Imaging Results:  Relevant Lab Results:   Additional Information SSN: [id number]  Scharlene Gloss, Student-SW [phone]

## 2015-12-06 NOTE — Progress Notes (Signed)
Patient ID: [name], female   DOB: 04/15/1966, 50 y.o.   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 This is a third time this has happened in the past week prior to this admission. Foley was replaced in ED and initial thought was that patient will go back to skilled nursing facility but she spiked a fever of 102 F and had tachycardia of 122. She was placed on broad-spectrum antibiotics for sepsis thought to be UTI.  Transferred to telemetry 12/05/2015  Assessment/Plan:    Sepsis ([place]) secondary to complicated UTI / urinary tract infection due to chronic indwelling Foley catheter / Leukocytosis  - Sepsis criteria met on the admission with fever, tachycardia, tachypnea, leukocytosis. Lactic acid was within normal limits. Pro calcitonin was slightly elevated at 0.76. - Multiple sources of infection are present including UTI because of indwelling Foley catheter as well as bacteremia, please see the management below - Urine culture so far shows no growth. - Current antibiotic primaxin will cover for UTI as well as bacteremia. - Stopped vancomycin and cefepime today.  Active Problems:   Klebsiella bacteremia - One of the blood cultures from 12/04/2015 growing Klebsiella pneumonia while other culture is showing no growth. - Stop cefepime and Vanco and start Primaxin - Repeat blood cultures today to ensure clearance of bacteremia   Obesity / dysphasia / percutaneous enteral gastrostomy tube dependent - Appreciate dietitian recommendations and help with tube  feeds      Essential hypertension - Continue metoprolol 50 mg twice daily    Seizures ([place]) - Continue Keppra and Vimpat. - No reports of seizures since admission    Hypokalemia - Due to acute infection - Continue to supplement   Hypernatremia - Secondary to dehydration, tube feedings - Continue water flushes - Sodium now within normal limits   Decubitus ulcer of sacral region, stage 4 (HCC) - Chronic, non-healing pressure injury (Stage 4) to sacrum - Measurement:7.5cm x 5.5cm x 2.5cm with undermining from 9-12 o'clock measuring 3cm at 9 o'clock and 2cm at 12 o'clock. - Dressing procedure/placement/frequency: NPWT today at 153mHg continuous negative pressure and used one piece of black foam to obliterate the dead space and one piece of black foam to provide a bridge to the left hip, although this may not be required in future. Patient turns from side to side and is not at risk for lying on TRAC pad if it were to be placed over the ulcer. Wound care RN used a small piece of pectin from an ostomy skin barrier ring to fill in the defect at the apex of the gluteal crease and achieved an immediate seal. Patient is incontinent of feces; has an indwelling urinary catheter for urinary incontinence. Replaced SNF provided bilateral foam heel protectors with Prevalon Boots as they are superior for pressure redistribution to this vulnerable area.   Adult failure to thrive / history of stroke with current residual effects - Evaluated by the palliative care team 10/2015 with family decision to continue aggressive care/full CODE STATUS. - Appreciate palliative care consult and recommendations   Normocytic anemia - Continue iron supplement.  DVT Prophylaxis  - Lovenox  subQ in hospital  Code Status: Full.  Family Communication:  Family not at the bedside this am Disposition Plan: repeat blood cultures today and once we know bacteremia cleared up then she will be okay for discharge.  Anticipated by 12/09/2015  IV access:  Peripheral IV  Procedures and diagnostic studies:    Dg Chest Port 1 View 12-17-2015   No active disease. Electronically Signed   By: Lucienne Capers M.D.   On: 12/17/2015 03:10   Medical Consultants:  PCT  Other Consultants:  WOC PT  IAnti-Infectives:   Primaxin 12/06/2015 -->  Aztreonam and Levaquin 2015/12/17 --> 12/05/2015 Vanco 12-17-2015 --> 12/06/2015 Cefepime 12/05/2015 --> 12/06/2015   Meghan Lenz, MD  [facility] Pager [phone]  Time spent in minutes: 25 minutes  If 7PM-7AM, please contact night-coverage [email or web address] Password [facility] 12/06/2015, 12:26 PM   LOS: 2 days    HPI/Subjective: No acute overnight events. No reports of seizures.   Objective: Filed Vitals:   12/05/15 1020 12/05/15 1351 12/05/15 2120 12/06/15 0459  BP: 145/102 153/112 148/101 146/83  Pulse: 100 107 107 104  Temp: 99.3 F (37.4 C) 99.8 F (37.7 C) 99.1 F (37.3 C) 99.1 F (37.3 C)  TempSrc: Oral Oral Oral Oral  Resp: 22 20 19 18   Height:      Weight:    182 lb 8.7 oz (82.8 kg)  SpO2: 99% 99% 99% 99%    Intake/Output Summary (Last 24 hours) at 12/06/15 1226 Last data filed at 12/06/15 1000  Gross per 24 hour  Intake 3100.01 ml  Output   2500 ml  Net 600.01 ml    Exam:   General:  Pt is not in acute distress  Cardiovascular: RRR, S1/S2 appreciated   Respiratory: no wheezing, no crackles, no rhonchi  Abdomen: PEG in place, foley in place, (+) BS  Extremities: (+)Prevalon boots, pulses  palpable bilaterally  Neuro: Nonfocal  Data Reviewed: Basic Metabolic Panel:  Recent Labs Lab 2015-12-17 0320 December 17, 2015 0900 12/05/15 0350 12/06/15 0544  NA 155*  --  144 137  K 3.6  --  3.1* 3.3*  CL 119*  --  113* 107  CO2 27  --  22 21*  GLUCOSE 97  --  114* 110*  BUN 23*  --  20 15  CREATININE 0.48 0.59 0.44 0.44  CALCIUM 9.3  --  8.4* 8.3*  MG  --   --   --  1.8   Liver Function Tests:  Recent Labs Lab 17-Dec-2015 0320   AST 21  ALT 60*  ALKPHOS 143*  BILITOT 0.4  PROT 6.6  ALBUMIN 2.6*   No results for input(s): LIPASE, AMYLASE in the last 168 hours. No results for input(s): AMMONIA in the last 168 hours. CBC:  Recent Labs Lab 17-Dec-2015 0320 2015/12/17 0900 12/05/15 0350 12/06/15 0544  WBC 8.5 11.2* 6.6 6.5  NEUTROABS 4.9  --   --   --   HGB 8.9* 8.8* 8.2* 8.6*  HCT 29.2* 28.7* 26.3* 27.0*  MCV 98.3 98.6 96.7 93.1  PLT 291 272 269 261   Cardiac Enzymes: No results for input(s): CKTOTAL, CKMB, CKMBINDEX, TROPONINI in the last 168 hours. BNP: Invalid input(s): POCBNP CBG: No results for input(s): GLUCAP in the last 168 hours.  Recent Results (from the past 240 hour(s))  Urine culture     Status: None   Collection Time: 12-17-2015  2:47 AM  Result Value Ref Range Status   Specimen Description URINE, CATHETERIZED  Final   Special  Requests NONE  Final   Culture   Final    NO GROWTH 1 DAY Performed at [facility]    Report Status 12/05/2015 FINAL  Final  Blood Culture (routine x 2)     Status: None (Preliminary result)   Collection Time: 12/04/15  3:15 AM  Result Value Ref Range Status   Specimen Description BLOOD PICC LINE  Final   Special Requests BOTTLES DRAWN AEROBIC AND ANAEROBIC 5ML  Final   Culture  Setup Time   Final    GRAM NEGATIVE RODS IN BOTH AEROBIC AND ANAEROBIC BOTTLES CRITICAL RESULT CALLED TO, READ BACK BY AND VERIFIED WITH: B.MCNABB,RN 12/04/15 @1712  BY V.WILKINS CONFIRMED BY K.WOOTEN    Culture   Final    KLEBSIELLA PNEUMONIAE Confirmed Extended Spectrum Beta-Lactamase Producer (ESBL) Performed at [facility]    Report Status PENDING  Incomplete   Organism ID, Bacteria KLEBSIELLA PNEUMONIAE  Final      Susceptibility   Klebsiella pneumoniae - MIC*    AMPICILLIN >=32 RESISTANT Resistant     CEFAZOLIN >=64 RESISTANT Resistant     CEFEPIME 2 RESISTANT Resistant     CEFTAZIDIME 16 RESISTANT Resistant     CEFTRIAXONE >=64 RESISTANT Resistant      CIPROFLOXACIN >=4 RESISTANT Resistant     GENTAMICIN <=1 SENSITIVE Sensitive     IMIPENEM <=0.25 SENSITIVE Sensitive     TRIMETH/SULFA >=320 RESISTANT Resistant     AMPICILLIN/SULBACTAM >=32 RESISTANT Resistant     PIP/TAZO 16 SENSITIVE Sensitive     * KLEBSIELLA PNEUMONIAE  Blood Culture (routine x 2)     Status: None (Preliminary result)   Collection Time: 12/04/15  9:04 AM  Result Value Ref Range Status   Specimen Description BLOOD LEFT ANTECUBITAL  Final   Special Requests BOTTLES DRAWN AEROBIC AND ANAEROBIC 5CC  Final   Culture   Final    NO GROWTH 2 DAYS Performed at [facility]    Report Status PENDING  Incomplete  MRSA PCR Screening     Status: None   Collection Time: 12/04/15 10:37 AM  Result Value Ref Range Status   MRSA by PCR NEGATIVE NEGATIVE Final    Comment:        The GeneXpert MRSA Assay (FDA approved for NASAL specimens only), is one component of a comprehensive MRSA colonization surveillance program. It is not intended to diagnose MRSA infection nor to guide or monitor treatment for MRSA infections.      Scheduled Meds: . antiseptic oral rinse  7 mL Mouth Rinse BID  . cholecalciferol  5,000 Units Oral Q breakfast  . cholestyramine light  4 g Oral TID  . enoxaparin (LOVENOX) injection  40 mg Subcutaneous Q24H  . [START ON 12/07/2015] feeding supplement (PRO-STAT SUGAR FREE 64)  60 mL Per Tube Daily  . ferrous sulfate  300 mg Oral BID WC  . free water  200 mL Oral Q6H  . imipenem-cilastatin  500 mg Intravenous Q6H  . lacosamide  100 mg Oral BID  . levETIRAcetam  1,500 mg Oral BID  . metoprolol  50 mg Per Tube BID  . multivitamin with minerals  1 tablet Per Tube Q breakfast  . polyethylene glycol  17 g Per Tube Daily  . polyvinyl alcohol  2 drop Both Eyes TID  . ranitidine  150 mg Per Tube BID  . [START ON 12/07/2015] vancomycin  750 mg Intravenous Q12H  . vitamin C  1,000 mg Per Tube Daily   Continuous Infusions: .  sodium chloride 100 mL/hr  at 12/05/15 2333  . feeding supplement (JEVITY 1.5 CAL/FIBER) 1,000 mL (12/06/15 1300)

## 2015-12-06 NOTE — Clinical Social Work Placement (Signed)
   CLINICAL SOCIAL WORK PLACEMENT  NOTE  Date:  12/06/2015  Patient Details  Name: [name] MRN: [id number] Date of Birth: Jun 16, 1966  Clinical Social Work is seeking post-discharge placement for this patient at the Skilled  Nursing Facility level of care (*CSW will initial, date and re-position this form in  chart as items are completed):  Yes   Patient/family provided with [place] Clinical Social Work Department's list of facilities offering this level of care within the geographic area requested by the patient (or if unable, by the patient's family).  Yes   Patient/family informed of their freedom to choose among providers that offer the needed level of care, that participate in Medicare, Medicaid or managed care program needed by the patient, have an available bed and are willing to accept the patient.      Patient/family informed of [facility]'s ownership interest in [facility] and [facility], as well as of the fact that they are under no obligation to receive care at these facilities.  PASRR submitted to EDS on       PASRR number received on       Existing PASRR number confirmed on 12/06/15     FL2 transmitted to all facilities in geographic area requested by pt/family on 12/06/15     FL2 transmitted to all facilities within larger geographic area on 12/06/15     Patient informed that his/her managed care company has contracts with or will negotiate with certain facilities, including the following:            Patient/family informed of bed offers received.  Patient chooses bed at       Physician recommends and patient chooses bed at      Patient to be transferred to   on  .  Patient to be transferred to facility by       Patient family notified on   of transfer.  Name of family member notified:        PHYSICIAN       Additional Comment:    _______________________________________________ Scharlene Gloss, Student-SW 12/06/2015, 2:43 PM

## 2015-12-07 DIAGNOSIS — B961 Klebsiella pneumoniae [K. pneumoniae] as the cause of diseases classified elsewhere: Secondary | ICD-10-CM

## 2015-12-07 LAB — BASIC METABOLIC PANEL
Anion gap: 8 (ref 5–15)
BUN: 11 mg/dL (ref 6–20)
CALCIUM: 8.3 mg/dL — AB (ref 8.9–10.3)
CO2: 21 mmol/L — ABNORMAL LOW (ref 22–32)
Chloride: 104 mmol/L (ref 101–111)
Creatinine, Ser: <0.3 mg/dL — ABNORMAL LOW (ref 0.44–1.00)
Glucose, Bld: 108 mg/dL — ABNORMAL HIGH (ref 65–99)
Potassium: 2.9 mmol/L — ABNORMAL LOW (ref 3.5–5.1)
SODIUM: 133 mmol/L — AB (ref 135–145)

## 2015-12-07 LAB — CBC
HCT: 27.3 % — ABNORMAL LOW (ref 36.0–46.0)
Hemoglobin: 9 g/dL — ABNORMAL LOW (ref 12.0–15.0)
MCH: 30.2 pg (ref 26.0–34.0)
MCHC: 33 g/dL (ref 30.0–36.0)
MCV: 91.6 fL (ref 78.0–100.0)
PLATELETS: 300 10*3/uL (ref 150–400)
RBC: 2.98 MIL/uL — AB (ref 3.87–5.11)
RDW: 16.1 % — ABNORMAL HIGH (ref 11.5–15.5)
WBC: 6 10*3/uL (ref 4.0–10.5)

## 2015-12-07 LAB — CULTURE, BLOOD (ROUTINE X 2)

## 2015-12-07 MED ORDER — HYDRALAZINE HCL 20 MG/ML IJ SOLN
5.0000 mg | Freq: Three times a day (TID) | INTRAMUSCULAR | Status: DC
  Administered 2015-12-07 – 2015-12-09 (×6): 5 mg via INTRAVENOUS
  Filled 2015-12-07 (×6): qty 1

## 2015-12-07 MED ORDER — POTASSIUM CHLORIDE 10 MEQ/100ML IV SOLN
10.0000 meq | INTRAVENOUS | Status: AC
  Administered 2015-12-07 (×4): 10 meq via INTRAVENOUS
  Filled 2015-12-07 (×2): qty 100

## 2015-12-07 NOTE — Progress Notes (Signed)
Patient ID: Kayden Amend, female   DOB: 1965/12/14, 50 y.o.   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 This is a third time this has happened in the past week prior to this admission. Foley was replaced in ED and initial thought was that patient will go back to skilled nursing facility but she spiked a fever of 102 F and had tachycardia of 122. She was placed on broad-spectrum antibiotics for sepsis thought to be UTI.  Transferred to telemetry 12/05/2015  Hospital course complicated with finding of Klebsiella bacteremia.  Assessment/Plan:    Sepsis ([place]) secondary to complicated UTI / urinary tract infection due to chronic indwelling Foley catheter / Leukocytosis  - Sepsis criteria met on the admission with fever, tachycardia, tachypnea, leukocytosis. Lactic acid was within normal limits. Pro calcitonin was slightly elevated at 0.76. - Multiple sources of infection are present including UTI and now we know Klebsiella bacteremia. - Urine culture so far shows no growth. - Antibiotics changed to Primaxin starting 12/06/2015. She was on vancomycin and cefepime from the time of the admission through 12/06/2015.  Active Problems:   Klebsiella bacteremia - One of the blood cultures from 12/04/2015 growing Klebsiella pneumonia while other culture is showing no growth. - Repeat blood cultures are pending - Primaxin was started 12/06/2015. We stop cefepime and vancomycin 12/06/2015.   Obesity / dysphasia / percutaneous enteral gastrostomy tube dependent - Appreciate  nutritionist help      Essential hypertension - Continue metoprolol 50 mg twice daily  - Since blood pressure still up, 162/94 we added hydralazine 5 mg IV every 8 hours   Seizures (HCC) - Continue Keppra and Vimpat.    Hypokalemia - Due to acute infection - Continue to supplement   Hypernatremia - Secondary to dehydration, tube feedings - Sodium normalized with water flushes   Decubitus ulcer of sacral region, stage 4 (HCC) - Chronic, non-healing pressure injury (Stage 4) to sacrum - Measurement:7.5cm x 5.5cm x 2.5cm with undermining from 9-12 o'clock measuring 3cm at 9 o'clock and 2cm at 12 o'clock. - Dressing procedure/placement/frequency: NPWT today at 18mHg continuous negative pressure and used one piece of black foam to obliterate the dead space and one piece of black foam to provide a bridge to the left hip, although this may not be required in future. Patient turns from side to side and is not at risk for lying on TRAC pad if it were to be placed over the ulcer. Wound care RN used a small piece of pectin from an ostomy skin barrier ring to fill in the defect at the apex of the gluteal crease and achieved an immediate seal. Patient is incontinent of feces; has an indwelling urinary catheter for urinary incontinence. Replaced SNF provided bilateral foam heel protectors with Prevalon Boots as they are superior for pressure redistribution to this vulnerable area.   Adult failure to thrive / history of stroke with current residual effects - Evaluated by the palliative care team 10/2015 with family decision to continue aggressive care/full CODE STATUS. - Appreciate palliative care following    Normocytic anemia - Continue iron supplement. - Hemoglobin stable at  9  DVT Prophylaxis  - Lovenox subQ   Code Status: Full.  Family Communication:  Family not at the bedside this am Disposition Plan: Awaiting repeat blood culture results. If blood cultures clear then plan for  discharge by 12/08/2015  IV access:  Peripheral IV  Procedures and diagnostic studies:    Dg Chest [facility] 12-26-15   No active disease. Electronically Signed   By: Lucienne Capers M.D.   On: 12/26/2015 03:10   Medical Consultants:  PCT  Other Consultants:  WOC PT  IAnti-Infectives:   Primaxin 12/06/2015 -->  Aztreonam and Levaquin 12-26-2015 --> 12/05/2015 Vanco Dec 26, 2015 --> 12/06/2015 Cefepime 12/05/2015 --> 12/06/2015   [name], MD  [facility] Pager [phone]  Time spent in minutes: 25 minutes  If 7PM-7AM, please contact night-coverage [email or web address] Password [email or web address] 12/07/2015, 11:15 AM   LOS: 3 days    HPI/Subjective: No acute overnight events. No nausea or vomiting.  Objective: Filed Vitals:   12/06/15 2132 12/07/15 0259 12/07/15 0541 12/07/15 1000  BP: 164/103 150/90 160/100 162/94  Pulse: 98  102 80  Temp: 97.9 F (36.6 C)  100.2 F (37.9 C)   TempSrc: Oral  Oral   Resp: 20  15   Height:      Weight:   179 lb 3.7 oz (81.3 kg)   SpO2: 99%  98%     Intake/Output Summary (Last 24 hours) at 12/07/15 1115 Last data filed at 12/07/15 0700  Gross per 24 hour  Intake   3390 ml  Output   1775 ml  Net   1615 ml    Exam:   General:  Pt is sleeping, no distress   Cardiovascular: Rate controlled, appreciate S1, S2   Respiratory: Bilateral air entry, no wheezing  Abdomen: Nontender, appreciate bowel sounds PEG in place, foley in place  Extremities: (+) Prevalon boots, bilateral pulses   Neuro: No focal deficits   Data Reviewed: Basic Metabolic Panel:  Recent Labs Lab December 26, 2015 0320 12-26-2015 0900 12/05/15 0350 12/06/15 0544 12/07/15 0630  NA 155*  --  144 137 133*  K 3.6  --  3.1* 3.3* 2.9*  CL 119*  --  113* 107 104  CO2 27  --  22 21* 21*  GLUCOSE 97  --  114* 110* 108*  BUN 23*  --  _0 CREATININE 0.48 0.59 0.44 0.44 <0.30*  CALCIUM 9.3  --  8.4* 8.3* 8.3*  MG  --   --   --  1.8  --    Liver Function  Tests:  Recent Labs Lab 2015-12-26 0320  AST 21  ALT 60*  ALKPHOS 143*  BILITOT 0.4  PROT 6.6  ALBUMIN 2.6*   No results for input(s): LIPASE, AMYLASE in the last 168 hours. No results for input(s): AMMONIA in the last 168 hours. CBC:  Recent Labs Lab 2015-12-26 0320 12-26-2015 0900 12/05/15 0350 12/06/15 0544 12/07/15 0630  WBC 8.5 11.2* 6.6 6.5 6.0  NEUTROABS 4.9  --   --   --   --   HGB 8.9* 8.8* 8.2* 8.6* 9.0*  HCT 29.2* 28.7* 26.3* 27.0* 27.3*  MCV 98.3 98.6 96.7 93.1 91.6  PLT 291 272 269 261 300   Cardiac Enzymes: No results for input(s): CKTOTAL, CKMB, CKMBINDEX, TROPONINI in the last 168 hours. BNP: Invalid input(s): POCBNP CBG: No results for input(s): GLUCAP in the last 168 hours.  Recent Results (from the past 240 hour(s))  Urine culture     Status:  None   Collection Time: 12/04/15  2:47 AM  Result Value Ref Range Status   Specimen Description URINE, CATHETERIZED  Final   Special Requests NONE  Final   Culture   Final    NO GROWTH 1 DAY Performed at [facility]    Report Status 12/05/2015 FINAL  Final  Blood Culture (routine x 2)     Status: None   Collection Time: 12/04/15  3:15 AM  Result Value Ref Range Status   Specimen Description BLOOD PICC LINE  Final   Special Requests BOTTLES DRAWN AEROBIC AND ANAEROBIC 5ML  Final   Culture  Setup Time   Final    GRAM NEGATIVE RODS IN BOTH AEROBIC AND ANAEROBIC BOTTLES CRITICAL RESULT CALLED TO, READ BACK BY AND VERIFIED WITH: B.MCNABB,RN 12/04/15 _0  BY V.WILKINS CONFIRMED BY K.WOOTEN    Culture   Final    KLEBSIELLA PNEUMONIAE Confirmed Extended Spectrum Beta-Lactamase Producer (ESBL) Performed at [facility]    Report Status 12/07/2015 FINAL  Final   Organism ID, Bacteria KLEBSIELLA PNEUMONIAE  Final      Susceptibility   Klebsiella pneumoniae - MIC*    AMPICILLIN >=32 RESISTANT Resistant     CEFAZOLIN >=64 RESISTANT Resistant     CEFEPIME 2 RESISTANT Resistant     CEFTAZIDIME  16 RESISTANT Resistant     CEFTRIAXONE >=64 RESISTANT Resistant     CIPROFLOXACIN >=4 RESISTANT Resistant     GENTAMICIN <=1 SENSITIVE Sensitive     IMIPENEM <=0.25 SENSITIVE Sensitive     TRIMETH/SULFA >=320 RESISTANT Resistant     AMPICILLIN/SULBACTAM >=32 RESISTANT Resistant     PIP/TAZO 16 SENSITIVE Sensitive     * KLEBSIELLA PNEUMONIAE  Blood Culture (routine x 2)     Status: None (Preliminary result)   Collection Time: 12/04/15  9:04 AM  Result Value Ref Range Status   Specimen Description BLOOD LEFT ANTECUBITAL  Final   Special Requests BOTTLES DRAWN AEROBIC AND ANAEROBIC 5CC  Final   Culture   Final    NO GROWTH 2 DAYS Performed at [facility]    Report Status PENDING  Incomplete  MRSA PCR Screening     Status: None   Collection Time: 12/04/15 10:37 AM  Result Value Ref Range Status   MRSA by PCR NEGATIVE NEGATIVE Final    Comment:        The GeneXpert MRSA Assay (FDA approved for NASAL specimens only), is one component of a comprehensive MRSA colonization surveillance program. It is not intended to diagnose MRSA infection nor to guide or monitor treatment for MRSA infections.   Cath Tip Culture     Status: None (Preliminary result)   Collection Time: 12/04/15  1:05 PM  Result Value Ref Range Status   Specimen Description CATH TIP TIP FROM RIGHT DL PICC  Final   Special Requests NONE  Final   Culture   Final    Culture reincubated for better growth Performed at [facility]    Report Status PENDING  Incomplete     Scheduled Meds: . antiseptic oral rinse  7 mL Mouth Rinse BID  . cholecalciferol  5,000 Units Oral Q breakfast  . cholestyramine light  4 g Oral TID  . enoxaparin (LOVENOX) injection  40 mg Subcutaneous Q24H  . feeding supplement (PRO-STAT SUGAR FREE 64)  60 mL Per Tube Daily  . ferrous sulfate  300 mg Oral BID WC  . free water  200 mL Oral Q6H  . hydrALAZINE  5 mg Intravenous 3 times per day  . imipenem-cilastatin  500 mg  Intravenous Q6H  . lacosamide  100 mg Oral BID  . levETIRAcetam  1,500 mg Oral BID  . metoprolol  50 mg Per Tube BID  . multivitamin with minerals  1 tablet Per Tube Q breakfast  . polyethylene glycol  17 g Per Tube Daily  . polyvinyl alcohol  2 drop Both Eyes TID  . potassium chloride  10 mEq Intravenous Q1 Hr x 4  . ranitidine  150 mg Per Tube BID  . vitamin C  1,000 mg Per Tube Daily   Continuous Infusions: . sodium chloride 100 mL/hr at 12/07/15 1011  . feeding supplement (JEVITY 1.5 CAL/FIBER) 1,000 mL (12/07/15 0730)

## 2015-12-08 LAB — MAGNESIUM: MAGNESIUM: 1.9 mg/dL (ref 1.7–2.4)

## 2015-12-08 LAB — BASIC METABOLIC PANEL
ANION GAP: 10 (ref 5–15)
BUN: 10 mg/dL (ref 6–20)
CALCIUM: 8.1 mg/dL — AB (ref 8.9–10.3)
CO2: 20 mmol/L — ABNORMAL LOW (ref 22–32)
Chloride: 101 mmol/L (ref 101–111)
Creatinine, Ser: 0.37 mg/dL — ABNORMAL LOW (ref 0.44–1.00)
GLUCOSE: 118 mg/dL — AB (ref 65–99)
Potassium: 3 mmol/L — ABNORMAL LOW (ref 3.5–5.1)
Sodium: 131 mmol/L — ABNORMAL LOW (ref 135–145)

## 2015-12-08 MED ORDER — SODIUM CHLORIDE 0.9 % IV SOLN: 500.0000 mg | Freq: Four times a day (QID) | INTRAVENOUS | Status: AC

## 2015-12-08 MED ORDER — POTASSIUM CHLORIDE 25 MEQ PO PACK
25.0000 meq | PACK | Freq: Every day | ORAL | Status: DC
Start: 2015-12-08 — End: 2016-01-11

## 2015-12-08 MED ORDER — POTASSIUM CHLORIDE 10 MEQ/100ML IV SOLN
10.0000 meq | INTRAVENOUS | Status: AC
  Administered 2015-12-08 (×4): 10 meq via INTRAVENOUS
  Filled 2015-12-08: qty 100

## 2015-12-08 MED ORDER — CETYLPYRIDINIUM CHLORIDE 0.05 % MT LIQD: 7.0000 mL | Freq: Two times a day (BID) | OROMUCOSAL | Status: AC

## 2015-12-08 MED ORDER — RANITIDINE HCL 150 MG/10ML PO SYRP: 150.0000 mg | ORAL_SOLUTION | Freq: Two times a day (BID) | ORAL | Status: DC

## 2015-12-08 MED ORDER — ONDANSETRON HCL 4 MG PO TABS: 4.0000 mg | ORAL_TABLET | Freq: Four times a day (QID) | ORAL | Status: DC | PRN

## 2015-12-08 NOTE — Discharge Summary (Addendum)
Physician Discharge Summary  Winona Sison PVV:[id number] DOB: 12-21-1965 DOA: 12/03/2015  PCP: Arnette Norris, MD  Admit date: 12/03/2015 Discharge date: 12/08/2015  Recommendations for Outpatient Follow-up:  1. Patient has blood culture positive for ESBL, one of the blood cultures on the admission. Another blood culture showed no growth and repeat blood cultures showed no growth. Catheter tip culture showed Pseudomonas. She should be on Primaxin through 12/20/2015 which should cover for both bacteria.  2. Please continue to have palliative care services followed in the skilled nursing facility.  Discharge Diagnoses:  Principal Problem:   Sepsis secondary to UTI [facility]) Active Problems:   UTI (urinary tract infection) due to urinary indwelling Foley catheter (HCC)   Leukocytosis   Bacteremia due to Klebsiella pneumoniae   Seizures (HCC)   Hypernatremia   Dysphagia   Hx of ischemic right ACA stroke   History of ischemic left ACA stroke   Decubitus ulcer of sacral region, stage 4 (HCC)   Palliative care encounter   Normocytic anemia   History of stroke with current residual effects   S/P percutaneous endoscopic gastrostomy (PEG) tube placement (HCC)   FTT (failure to thrive) in adult   Benign essential HTN   Hypokalemia   Discharge Condition: stable   Diet recommendation: as tolerated   History of present illness:  50 -year-old female with past medical history significant for ruptured cerebral aneurysm in 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 This is a third time this has happened in the past week prior to this admission. Foley was replaced in ED and initial thought was that patient will go back to skilled nursing facility but she spiked a fever of 102 F and had tachycardia of 122. She was  placed on broad-spectrum antibiotics for sepsis thought to be UTI.  Transferred to telemetry 12/05/2015  Hospital course complicated with finding of Klebsiella bacteremia.  Hospital Course:   Assessment/Plan:    Sepsis ([place]) secondary to complicated UTI / urinary tract infection due to chronic indwelling Foley catheter / tube culture with Pseudomonas Leukocytosis  - Sepsis criteria met on the admission with fever, tachycardia, tachypnea, leukocytosis. Lactic acid was within normal limits. Pro calcitonin was slightly elevated at 0.76. - Multiple sources of infection are present including UTI and now we know Klebsiella bacteremia. In addition the tip culture was analyzed and is growing Pseudomonas. Current antibiotic will cover for Pseudomonas as well. - Urine culture so far shows no growth. - Antibiotics changed to Primaxin starting 12/06/2015. She was on vancomycin and cefepime from the time of the admission through 12/06/2015. - Patient will continue Primaxin through 12/20/2015 to complete 2 week treatment for Klebsiella bacteremia. Repeat cultures show no growth to date.  Active Problems:  Klebsiella bacteremia - One of the blood cultures from 12/04/2015 growing Klebsiella pneumonia while other culture is showing no growth. - Repeat blood cultures show no growth to date - Primaxin was started 12/06/2015. We stopped cefepime and vancomycin 12/06/2015. - As mentioned above, patient will continue Primaxin through 12/20/2015   Obesity / dysphasia / percutaneous enteral gastrostomy tube dependent - Appreciate nutritionist help    Essential hypertension - Continue metoprolol 50 mg twice daily  - Continue lisinopril.   Seizures ([place]) - Continue Keppra and Vimpat.   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 - Dressing procedure/placement/frequency: NPWT today at 137mHg continuous negative pressure and used one piece of black foam to obliterate the dead space and one piece of black foam to provide a bridge to the left hip, although this may not be required in future. Patient turns from side to side and is not at risk for lying on TRAC pad if it were to be placed over the ulcer. Wound care RN used a small piece of pectin from an ostomy skin barrier ring to fill in the defect at the apex of the gluteal crease and achieved an immediate seal. Patient is incontinent of feces; has an indwelling urinary catheter for urinary incontinence. Replaced SNF provided bilateral foam heel protectors with Prevalon Boots as they are superior for pressure redistribution to this vulnerable area.   Adult failure to thrive / history of stroke with current residual effects - Evaluated by the palliative care team 10/2015 with family decision to continue aggressive care/full CODE STATUS. - Appreciate palliative care following    Normocytic anemia - Continue iron supplement. - Hemoglobin stable   DVT Prophylaxis  - Lovenox subQ in hospital  Code Status: Full.  Family Communication: Family not at the bedside this am   IV access:  Peripheral IV  Procedures and diagnostic studies:   Dg Chest Port 1 View 12/04/2015 No active disease. Electronically Signed By: WLucienne CapersM.D. On: 12/04/2015 03:10   Medical Consultants:  PCT  Other Consultants:  WOC PT  IAnti-Infectives:   Primaxin 12/06/2015 --> Through 12/20/2015 Aztreonam and Levaquin 12/04/2015 --> 12/05/2015 Vanco 12/04/2015 --> 12/06/2015 Cefepime 12/05/2015 --> 12/06/2015   Signed:  DLeisa Lenz MD  [facility] 12/08/2015, 12:03  PM  Pager #: [phone] Time spent in minutes: more than 30 minutes    Discharge Exam: Filed Vitals:   12/08/15 0227 12/08/15 0511  BP: 155/103 150/97  Pulse: 96 100  Temp: 98.1 F (36.7 C) 98.7 F (37.1 C)  Resp:  18   Filed Vitals:   12/07/15 1539 12/07/15 2040 12/08/15 0227 12/08/15 0511  BP: 158/97 152/94 155/103 150/97  Pulse: 98 121 96 100  Temp: 98.6 F (37 C) 97.4 F (36.3 C) 98.1 F (36.7 C) 98.7 F (37.1 C)  TempSrc: Oral Oral Oral Oral  Resp: 16 16  18   Height:      Weight:    180 lb 8.9 oz (81.9 kg)  SpO2: 100% 100% 100% 100%    General: Pt is alert, not in acute distress Cardiovascular: Regular rate and rhythm, S1/S2 + Respiratory: Clear to auscultation bilaterally, no wheezing, no crackles, no rhonchi Abdominal: peg and foley (+), non tender abd  Extremities: no edema, no cyanosis, pulses palpable bilaterally DP and PT Neuro: Grossly nonfocal  Discharge Instructions  Discharge Instructions    Call MD for:  difficulty breathing, headache or visual disturbances    Complete by:  As directed      Call MD for:  persistant dizziness or light-headedness    Complete by:  As directed      Call MD for:  persistant nausea and vomiting    Complete by:  As directed      Call MD for:  severe uncontrolled pain  Complete by:  As directed      Diet - low sodium heart healthy    Complete by:  As directed      Discharge instructions    Complete by:  As directed   Patient needs Primaxin through 12/20/2015 for ESBL bacteremia. Repeat blood cultures showed no growth.     Increase activity slowly    Complete by:  As directed             Medication List    STOP taking these medications        cefTRIAXone 1 g in dextrose 5 % 50 mL     vancomycin 1,250 mg in sodium chloride 0.9 % 250 mL      TAKE these medications        acetaminophen 325 MG tablet  Commonly known as:  TYLENOL  Take 2 tablets (650 mg total) by mouth daily.     antiseptic oral  rinse 0.05 % Liqd solution  Commonly known as:  CPC / CETYLPYRIDINIUM CHLORIDE 0.05%  7 mLs by Mouth Rinse route 2 (two) times daily.     carboxymethylcellulose 0.5 % Soln  Commonly known as:  REFRESH PLUS  Place 2 drops into both eyes 3 (three) times daily.     cholestyramine light 4 g packet  Commonly known as:  PREVALITE  Take 4 g by mouth 3 (three) times daily. mix in 8 oz. Of Liquid     famotidine 40 MG/5ML suspension  Commonly known as:  PEPCID  Take 2.5 mLs (20 mg total) by mouth every 12 (twelve) hours.     feeding supplement (JEVITY 1.5 CAL/FIBER) Liqd  Place 1,000 mLs into feeding tube continuous.     feeding supplement (PRO-STAT SUGAR FREE 64) Liqd  Place 60 mLs into feeding tube 2 (two) times daily.     ferrous sulfate 300 (60 Fe) MG/5ML syrup  Take 5 mLs (300 mg total) by mouth 2 (two) times daily with a meal.     free water Soln  Take 200 mLs by mouth every 8 (eight) hours.     HEPARIN (PORCINE) LOCK FLUSH IV  Inject into the vein every 12 (twelve) hours.     imipenem-cilastatin 500 mg in sodium chloride 0.9 % 100 mL  Inject 500 mg into the vein every 6 (six) hours.     levETIRAcetam 100 MG/ML solution  Commonly known as:  KEPPRA  Take 15 mLs (1,500 mg total) by mouth 2 (two) times daily.     lisinopril 10 MG tablet  Commonly known as:  PRINIVIL,ZESTRIL  Give 10 mg by tube daily.     loperamide 2 MG tablet  Commonly known as:  IMODIUM A-D  Take 4 mg by mouth 4 (four) times daily as needed for diarrhea or loose stools.     metoprolol 50 MG tablet  Commonly known as:  LOPRESSOR  Take 1 tablet (50 mg total) by mouth 2 (two) times daily.     multivitamin with minerals Tabs tablet  Give 1 tablet by tube daily with breakfast.     ondansetron 4 MG tablet  Commonly known as:  ZOFRAN  Take 1 tablet (4 mg total) by mouth every 6 (six) hours as needed for nausea.     polyethylene glycol packet  Commonly known as:  MIRALAX  Take 17 g by mouth daily.      Potassium Chloride 25 MEQ Pack  Place 25 mEq into feeding tube daily.     ranitidine 150  MG/10ML syrup  Commonly known as:  ZANTAC  Place 10 mLs (150 mg total) into feeding tube 2 (two) times daily.     traZODone 50 MG tablet  Commonly known as:  DESYREL  Take 25 mg by mouth at bedtime as needed for sleep.     VIMPAT 100 MG Tabs  Generic drug:  Lacosamide  Take 100 mg by mouth 2 (two) times daily.     vitamin C 1000 MG tablet  Give 1,000 mg by tube daily.     Vitamin D3 5000 units Tabs  Take 5,000 Units by mouth daily with breakfast.            Follow-up Information    Follow up with Arnette Norris, MD.   Specialty:  [facility]   Contact information:   [place] [address] [phone]       Follow up with [facility]   Specialty:  Emergency Medicine   Contact information:   [place] [id number] [place] [phone]      Follow up with Arnette Norris, MD. Schedule an appointment as soon as possible for a visit in 1 week.   Specialty:  Family Medicine   Why:  Follow up appt after recent hospitalization   Contact information:   [place]  [phone]        The results of significant diagnostics from this hospitalization (including imaging, microbiology, ancillary and laboratory) are listed below for reference.    Significant Diagnostic Studies: Dg Chest Port 1 View  12/04/2015  CLINICAL DATA:  Fever.  Foley catheter came out 3 times this week. EXAM: PORTABLE CHEST 1 VIEW COMPARISON:  10/13/2015 FINDINGS: Shallow inspiration. Normal heart size and pulmonary vascularity. No focal airspace disease or consolidation in the lungs. No blunting of costophrenic angles. No pneumothorax. Mediastinal contours appear intact. Right PICC catheter with tip over the low SVC region. Degenerative changes in the shoulders. IMPRESSION: No active disease.  Electronically Signed   By: Lucienne Capers M.D.   On: 12/04/2015 03:10    Microbiology: Recent Results (from the past 240 hour(s))  Urine culture     Status: None   Collection Time: 12/04/15  2:47 AM  Result Value Ref Range Status   Specimen Description URINE, CATHETERIZED  Final   Special Requests NONE  Final   Culture   Final    NO GROWTH 1 DAY Performed at [facility]    Report Status 12/05/2015 FINAL  Final  Blood Culture (routine x 2)     Status: None   Collection Time: 12/04/15  3:15 AM  Result Value Ref Range Status   Specimen Description BLOOD PICC LINE  Final   Special Requests BOTTLES DRAWN AEROBIC AND ANAEROBIC 5ML  Final   Culture  Setup Time   Final    GRAM NEGATIVE RODS IN BOTH AEROBIC AND ANAEROBIC BOTTLES CRITICAL RESULT CALLED TO, READ BACK BY AND VERIFIED WITH: B.MCNABB,RN 12/04/15 @1712  BY V.WILKINS CONFIRMED BY K.WOOTEN    Culture   Final    KLEBSIELLA PNEUMONIAE Confirmed Extended Spectrum Beta-Lactamase Producer (ESBL) Performed at [facility]    Report Status 12/07/2015 FINAL  Final   Organism ID, Bacteria KLEBSIELLA PNEUMONIAE  Final      Susceptibility   Klebsiella pneumoniae - MIC*    AMPICILLIN >=32 RESISTANT Resistant     CEFAZOLIN >=64 RESISTANT Resistant     CEFEPIME 2 RESISTANT Resistant  CEFTAZIDIME 16 RESISTANT Resistant     CEFTRIAXONE >=64 RESISTANT Resistant     CIPROFLOXACIN >=4 RESISTANT Resistant     GENTAMICIN <=1 SENSITIVE Sensitive     IMIPENEM <=0.25 SENSITIVE Sensitive     TRIMETH/SULFA >=320 RESISTANT Resistant     AMPICILLIN/SULBACTAM >=32 RESISTANT Resistant     PIP/TAZO 16 SENSITIVE Sensitive     * KLEBSIELLA PNEUMONIAE  Blood Culture (routine x 2)     Status: None (Preliminary result)   Collection Time: 12/04/15  9:04 AM  Result Value Ref Range Status   Specimen Description BLOOD LEFT ANTECUBITAL  Final   Special Requests BOTTLES DRAWN AEROBIC AND ANAEROBIC 5CC  Final   Culture   Final    NO  GROWTH 3 DAYS Performed at [facility]    Report Status PENDING  Incomplete  MRSA PCR Screening     Status: None   Collection Time: 12/04/15 10:37 AM  Result Value Ref Range Status   MRSA by PCR NEGATIVE NEGATIVE Final    Comment:        The GeneXpert MRSA Assay (FDA approved for NASAL specimens only), is one component of a comprehensive MRSA colonization surveillance program. It is not intended to diagnose MRSA infection nor to guide or monitor treatment for MRSA infections.   Cath Tip Culture     Status: None (Preliminary result)   Collection Time: 12/04/15  1:05 PM  Result Value Ref Range Status   Specimen Description CATH TIP TIP FROM RIGHT DL PICC  Final   Special Requests NONE  Final   Culture   Final    >100 colonies PSEUDOMONAS AERUGINOSA Performed at [facility]    Report Status PENDING  Incomplete  Culture, blood (routine x 2)     Status: None (Preliminary result)   Collection Time: 12/06/15 12:50 PM  Result Value Ref Range Status   Specimen Description BLOOD LEFT ARM  Final   Special Requests IN PEDIATRIC BOTTLE [place]  Final   Culture   Final    NO GROWTH < 24 HOURS Performed at [facility]    Report Status PENDING  Incomplete  Culture, blood (routine x 2)     Status: None (Preliminary result)   Collection Time: 12/06/15 12:54 PM  Result Value Ref Range Status   Specimen Description BLOOD RIGHT HAND  Final   Special Requests BOTTLES DRAWN AEROBIC AND ANAEROBIC 6CC EA  Final   Culture   Final    NO GROWTH < 24 HOURS Performed at [facility]    Report Status PENDING  Incomplete     Labs: Basic Metabolic Panel:  Recent Labs Lab 12/04/15 0320 12/04/15 0900 12/05/15 0350 12/06/15 0544 12/07/15 0630 12/08/15 0511  NA 155*  --  144 137 133* 131*  K 3.6  --  3.1* 3.3* 2.9* 3.0*  CL 119*  --  113* 107 104 101  CO2 27  --  22 21* 21* 20*  GLUCOSE 97  --  114* 110* 108* 118*  BUN 23*  --  20 15 11 10   CREATININE 0.48  0.59 0.44 0.44 <0.30* 0.37*  CALCIUM 9.3  --  8.4* 8.3* 8.3* 8.1*  MG  --   --   --  1.8  --  1.9   Liver Function Tests:  Recent Labs Lab 12/04/15 0320  AST 21  ALT 60*  ALKPHOS 143*  BILITOT 0.4  PROT 6.6  ALBUMIN 2.6*   No results for input(s): LIPASE, AMYLASE  in the last 168 hours. No results for input(s): AMMONIA in the last 168 hours. CBC:  Recent Labs Lab 12/04/15 0320 12/04/15 0900 12/05/15 0350 12/06/15 0544 12/07/15 0630  WBC 8.5 11.2* 6.6 6.5 6.0  NEUTROABS 4.9  --   --   --   --   HGB 8.9* 8.8* 8.2* 8.6* 9.0*  HCT 29.2* 28.7* 26.3* 27.0* 27.3*  MCV 98.3 98.6 96.7 93.1 91.6  PLT 291 272 269 261 300   Cardiac Enzymes: No results for input(s): CKTOTAL, CKMB, CKMBINDEX, TROPONINI in the last 168 hours. BNP: BNP (last 3 results)  Recent Labs  03/18/15 1240 08/29/15 1305  BNP 69.7 47.2    ProBNP (last 3 results) No results for input(s): PROBNP in the last 8760 hours.  CBG: No results for input(s): GLUCAP in the last 168 hours.

## 2015-12-08 NOTE — Care Management Note (Signed)
Case Management Note  Patient Details  Name: [name] MRN: [id number] Date of Birth: 14-May-1966  Subjective/Objective:                    Action/Plan:d/c SNF.   Expected Discharge Date:                  Expected Discharge Plan:  Skilled Nursing Facility  In-House Referral:  Clinical Social Work  Discharge planning Services  CM Consult  Post Acute Care Choice:    Choice offered to:     DME Arranged:    DME Agency:     HH Arranged:    HH Agency:     Status of Service:  Completed, signed off  Medicare Important Message Given:    Date Medicare IM Given:    Medicare IM give by:    Date Additional Medicare IM Given:    Additional Medicare Important Message give by:     If discussed at Long Length of Stay Meetings, dates discussed:    Additional Comments:  Lanier Clam, RN 12/08/2015, 12:36 PM

## 2015-12-08 NOTE — Discharge Instructions (Signed)
Bacteremia °Bacteremia is the presence of bacteria in the blood. A small amount of bacteria may not cause any symptoms. °Sometimes, the bacteria spread and cause infection in other parts of the body, such as the heart, joints, bones, or brain. Having a great amount of bacteria can cause a serious, sometimes life-threatening infection called sepsis. °CAUSES °This condition is caused by bacteria that get into the blood. Bacteria can enter the blood: °· During a dental or medical procedure. °· After you brush your teeth so hard that the gums bleed. °· Through a scrape or cut on your skin. °More severe types of bacteremia can be caused by: °· A bacterial infection, such as pneumonia, that spreads to the blood. °· Using a dirty needle. °RISK FACTORS °This condition is more likely to develop in: °· Children and elderly adults. °· People who have a long-lasting (chronic) disease or medical condition. °· People who have an artificial joint or heart valve. °· People who have heart valve disease. °· People who have a tube, such as a catheter or IV tube, that has been inserted for a medical treatment. °· People who have a weak body defense system (immune system). °· People who use IV drugs. °SYMPTOMS °Usually, this condition does not cause symptoms when it is mild. When it is more serious, it may cause: °· Fever. °· Chills. °· Racing heart. °· Shortness of breath. °· Dizziness. °· Weakness. °· Confusion. °· Nausea or vomiting. °· Diarrhea. °Bacteremia that has spread to other parts of the body may cause symptoms in those areas. °DIAGNOSIS °This condition may be diagnosed with a physical exam and tests, such as: °· A complete blood count (CBC). This test looks for signs of infection. °· Blood cultures. These look for bacteria in your blood. °· Tests of any IV tubes. These look for a source of infection. °· Urine tests. °· Imaging tests, such as an X-ray, CT scan, MRI, or heart ultrasound. °TREATMENT °If the condition is mild,  treatment is usually not needed. Usually, the body's immune system will remove the bacteria. If the condition is more serious, it may be treated with: °· Antibiotic medicines through an IV tube. These may be given for about 2 weeks. At first, the antibiotic that is given may kill most types of blood bacteria. If your test results show that a certain kind of bacteria is causing problems, the antibiotic may be changed to kill only the bacteria that are causing problems. °· Antibiotics taken by mouth. °· Removing any catheter or IV tube that is a source of infection. °· Blood pressure and breathing support, if needed. °· Surgery to control the source or spread of infection, if needed. °HOME CARE INSTRUCTIONS °· Take over-the-counter and prescription medicines only as told by your health care provider. °· If you were prescribed an antibiotic, take it as told by your health care provider. Do not stop taking the antibiotic even if you start to feel better. °· Rest at home until your condition is under control. °· Drink enough fluid to keep your urine clear or pale yellow. °· Keep all follow-up visits as told by your health care provider. This is important. °PREVENTION °Take these actions to help prevent future episodes of bacteremia: °· Get all vaccinations as recommended by your health care provider. °· Clean and cover scrapes or cuts. °· Bathe regularly. °· Wash your hands often. °· Before any dental or surgical procedure, ask your health care provider if you should take an antibiotic. °SEEK MEDICAL   CARE IF:  Your symptoms get worse.  You continue to have symptoms after treatment.  You develop new symptoms after treatment. SEEK IMMEDIATE MEDICAL CARE IF:  You have chest pain or trouble breathing.  You develop confusion, dizziness, or weakness.  You develop pale skin.   This information is not intended to replace advice given to you by your health care provider. Make sure you discuss any questions you have  with your health care provider.   Document Released: 08/05/2006 Document Revised: 07/13/2015 Document Reviewed: 12/25/2014 Elsevier Interactive Patient Education 2016 Elsevier [employer]. Imipenem; Cilastatin injection What is this medicine? IMIPENEM; CILASTATIN (i mi PEN em; sye la STAT in) is a carbapenem antibiotic. It is used to treat certain kinds of bacterial infections. It will not work for colds, flu, or other viral infections. This medicine may be used for other purposes; ask your health care provider or pharmacist if you have questions. What should I tell my health care provider before I take this medicine? They need to know if you have any of these conditions: -brain tumor or lesion -kidney disease -seizure disorder -an unusual or allergic reaction to imipenem, cilastatin, other antibiotics or medicines, foods, dyes, or preservatives -pregnant or trying to get pregnant -breast-feeding How should I use this medicine? This medicine is infused into a vein. It is usually given by a health care professional in a hospital or clinic setting. If you get this medicine at home, you will be taught how to prepare and give this medicine. Use exactly as directed. Take your medicine at regular intervals. Do not take it more often than directed. It is important that you put your used needles and syringes in a special sharps container. Do not put them in a trash can. If you do not have a sharps container, call your pharmacist or healthcare provider to get one. Talk to your pediatrician regarding the use of this medicine in children. While this drug may be prescribed for children as young as newborn for selected conditions, precautions do apply. Overdosage: If you think you have taken too much of this medicine contact a poison control center or emergency room at once. NOTE: This medicine is only for you. Do not share this medicine with others. What if I miss a dose? If you miss a dose, take it as soon as  you can. If it is almost time for your next dose, take only that dose. Do not take double or extra doses. What may interact with this medicine? -birth control pills -ganciclovir -probenecid This list may not describe all possible interactions. Give your health care provider a list of all the medicines, herbs, non-prescription drugs, or dietary supplements you use. Also tell them if you smoke, drink alcohol, or use illegal drugs. Some items may interact with your medicine. What should I watch for while using this medicine? Tell your doctor or health care professional if your symptoms do not start to get better, if they get worse, or if you get new symptoms. Your doctor will monitor your condition and blood work as needed. Do not treat diarrhea with over the counter products. Contact your doctor if you have diarrhea that lasts more than 2 days or if it is severe and watery. What side effects may I notice from receiving this medicine? Side effects that you should report to your doctor or health care professional as soon as possible: -allergic reactions like skin rash, itching or hives, swelling of the face, lips, or tongue -breathing problems -changes  in blood pressure -change in hearing -confusion -dark urine -dizziness -fast, irregular heartbeat -fever -general ill feeling or flu-like symptoms -light-colored stools -loss of appetite, nausea -muscle stiffness or twitch -pain at site where injected -redness, blistering, peeling or loosening of the skin, including inside the mouth -right upper belly pain -seizures -trouble passing urine or change in the amount of urine -unusually weak or tired -vomiting -yellowing of eyes or skin Side effects that usually do not require medical attention (report to your doctor or health care professional if they continue or are bothersome): -diarrhea -headache -stomach upset or pain -too much saliva in the mouth This list may not describe all  possible side effects. Call your doctor for medical advice about side effects. You may report side effects to FDA at 1-800-FDA-1088. Where should I keep my medicine? Keep out of the reach of children. You will be instructed on how to store this medicine. Throw away any unused medicine after the expiration date on the label. NOTE: This sheet is a summary. It may not cover all possible information. If you have questions about this medicine, talk to your doctor, pharmacist, or health care provider.    2016, Elsevier/Gold Standard. (2013-05-29 07:41:22)

## 2015-12-08 NOTE — Progress Notes (Signed)
Patient had 5 beats of V-tach last night around 02:25am.  NP on call was notified; no new orders received. Will continue to monitor patient.

## 2015-12-09 ENCOUNTER — Inpatient Hospital Stay (HOSPITAL_COMMUNITY): Payer: Commercial Managed Care - HMO

## 2015-12-09 LAB — CULTURE, BLOOD (ROUTINE X 2): Culture: NO GROWTH

## 2015-12-09 MED ORDER — HEPARIN SOD (PORK) LOCK FLUSH 100 UNIT/ML IV SOLN
INTRAVENOUS | Status: DC
Start: 2015-12-09 — End: 2015-12-09
  Filled 2015-12-09: qty 5

## 2015-12-09 MED ORDER — LIDOCAINE HCL 1 % IJ SOLN
INTRAMUSCULAR | Status: AC
  Filled 2015-12-09: qty 20

## 2015-12-09 NOTE — Progress Notes (Signed)
Patient medically stable for discharge. Please refer to discharge summary completed 12/08/2015. No changes in medical management since 12/08/2015.  Manson Passey [facility] [phone]

## 2015-12-09 NOTE — Procedures (Signed)
R arm MIDLINE iv catheter placed No complication No blood loss. See complete dictation in [facility].

## 2015-12-09 NOTE — Progress Notes (Signed)
Nutrition Follow-up  DOCUMENTATION CODES:   Not applicable  INTERVENTION:  - Continue Jevity 1.5 @ 60 mL/hr with 60 mL Prostat once/day and 200 mL free water every 6 hours. This regimen provides 2360 kcal, 122 grams of protein, and 1894 mL free water - RD will continue to monitor for needs  NUTRITION DIAGNOSIS:   Increased nutrient needs related to wound healing as evidenced by estimated needs. -ongoing  GOAL:   Patient will meet greater than or equal to 90% of their needs -met with current TF regimen  MONITOR:   Labs, Weight trends, TF tolerance, Skin, I & O's  ASSESSMENT:   50 y.o. 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 A Foley catheter was replaced in the ED and the patient was going to be transferred back to her SNF when she spiked a fever to 102 and a heart rate of 122. History is obtained from ER notes and her nursing home records as the patient is nonverbal and unable to provide any additional history.  2/3 Pt currently receiving TF regimen as outlined above which is meeting needs. Pt is NPO with PEG in place. D/c order in place yesterday. If pt unable to d/c today will continue to monitor weight trends and for needs. Weight up 7 lbs since previous assessment. If weight continues to trend up and IVF not decreased or discontinued, it would be appropriate to decrease free water flush. Medications reviewed; IVF: 1/2 NS @ 100 mL/hr. Labs reviewed; Na: 131 mmol/L, Ca: 8.1 mg/dL, K: 3 mmol/L, creatinine low.    1/31 - Pt is nonverbal/aphasic.  - Needs have been re-estimated based on stage 4 ulcer with associated wound vac.  - Pt currently receiving Jevity 1.5 @ 40 mL/hr with 60 mL Prostat BID and 200 mL free water every 6 hours. - This regimen is  providing 1840 kcal (81% minimum estimated kcal needs), 121 grams protein, and 1530 mL free water.  - Page sent to Dr. Charlies Silvers concerning TF regimen.  - Received call back and MD provided verbal orders for RD to change TF regimen.  - Will order: Jevity 1.5 @ 60 mL/hr with 60 mL Prostat once/day - Palliative Care note states pending call back from son to discuss [place].  - Will monitor for decision regarding this.  1/29 - Pt in room with no family at bedside.  - RN at bedside and was preparing to start TF.  - Pt nonverbal. - Pt was followed by clinical nutrition staff during previous admission (12/08-12/16/16). - Per notes, PEG was placed and pt was started on Jevity 1.5 with goal rate of 40 ml/hr and 60 ml Prostat BID.  - Per weight history pt has lost 14 lb since 12/21 (8% wt loss x 1 month, significant for time frame). - RD estimated needs are higher than they were 1 month ago d/t recent weight loss and stage IV sacral ulcer.  - TF recommendations provided above. - Palliative care consult placed for updated [place] discussion. - Patient with no sign of depletion of muscle mass or body fat.    Diet Order:  Diet NPO time specified Diet - low sodium heart healthy  Skin:  Wound (see comment) (Stage 4 sacral pressure ulcer with wound vac)  Last BM:  2/3  Height:   Ht Readings from Last 1 Encounters:  12/04/15 5' 5"  (1.651 m)    Weight:   Wt Readings from Last 1 Encounters:  12/09/15 189 lb 2.5 oz (85.8 kg)    Ideal Body Weight:  56.8 kg  BMI:  Body mass index is 31.48 kg/(m^2).  Estimated Nutritional Needs:   Kcal:  7445-1460 (27-30 kcal/jg)  Protein:  120-130 grams  Fluid:  2L/day  EDUCATION NEEDS:   No education needs identified at this time     Jarome Matin, RD, LDN Inpatient Clinical Dietitian Pager # [phone] After hours/weekend pager # [phone]

## 2015-12-09 NOTE — Progress Notes (Signed)
Discharged to [facility] report given to Antonette RN, d/c with PICC line, Peg tube and FC, pressure ulcer dressing changed prior to discharged.

## 2015-12-09 NOTE — Progress Notes (Signed)
Patient is set to discharge back to [facility] today. Patient & sister, [name] (ph#: [phone]) aware. Discharge packet given to RN, Delorise Shiner. [facility] called for transport to pickup at 1:00pm.     Lincoln Maxin, LCSW [facility] Clinical Social Worker cell #: [phone]

## 2015-12-10 LAB — CATH TIP CULTURE: Culture: >100

## 2015-12-11 LAB — CULTURE, BLOOD (ROUTINE X 2)
CULTURE: NO GROWTH
Culture: NO GROWTH

## 2015-12-16 ENCOUNTER — Encounter: Payer: Medicaid Other | Attending: Surgery | Admitting: Surgery

## 2015-12-16 DIAGNOSIS — E440 Moderate protein-calorie malnutrition: Secondary | ICD-10-CM | POA: Insufficient documentation

## 2015-12-16 DIAGNOSIS — G8221 Paraplegia, complete: Secondary | ICD-10-CM | POA: Diagnosis not present

## 2015-12-16 DIAGNOSIS — L89154 Pressure ulcer of sacral region, stage 4: Secondary | ICD-10-CM | POA: Insufficient documentation

## 2015-12-17 NOTE — Progress Notes (Addendum)
[name], [name] ([id number]) Visit Report for 12/16/2015 Arrival Information Details Patient Name: [name], [name]. Date of Service: 12/16/2015 12:45 PM Medical Record Number: [id number] Patient Account Number: [id number] Date of Birth/Sex: Jul 17, 1966 (50 y.o. Female) Treating RN: Clover Mealy, RN, BSN, [place] Sink Primary Care Physician: Ruthe Mannan Other Clinician: Referring Physician: Ruthe Mannan Treating Physician/Extender: Rudene Re in Treatment: 4 Visit Information History Since Last Visit Added or deleted any medications: No Patient Arrived: Wheel Chair Any new allergies or adverse reactions: No Arrival Time: 12:55 Had a fall or experienced change in No activities of daily living that may affect Accompanied By: caregiver risk of falls: Transfer Assistance: None Signs or symptoms of abuse/neglect since last No Patient Identification Verified: Yes visito Secondary Verification Process Yes Hospitalized since last visit: No Completed: Has Dressing in Place as Prescribed: Yes Patient Requires Transmission-Based No Pain Present Now: No Precautions: Patient Has Alerts: No Electronic Signature(s) Signed: 12/16/2015 3:32:51 PM By: Elpidio Eric BSN, RN Entered By: Elpidio Eric on 12/16/2015 12:56:39 [name] ([id number]) -------------------------------------------------------------------------------- Encounter Discharge Information Details Patient Name: [name]. Date of Service: 12/16/2015 12:45 PM Medical Record Number: [id number] Patient Account Number: [id number] Date of Birth/Sex: [birth year] (50 y.o. Female) Treating RN: Clover Mealy, RN, BSN, [place] Sink Primary Care Physician: Ruthe Mannan Other Clinician: Referring Physician: Ruthe Mannan Treating Physician/Extender: Rudene Re in Treatment: 4 Encounter Discharge Information Items Schedule Follow-up Appointment: No Medication Reconciliation completed No and provided to Patient/Care Jerik Falletta: Provided on Clinical Summary  of Care: 12/16/2015 Form Type Recipient Paper Patient [facility] Electronic Signature(s) Signed: 12/16/2015 1:55:22 PM By: Gwenlyn Perking Entered By: Gwenlyn Perking on 12/16/2015 13:55:22 [name] ([id number]) -------------------------------------------------------------------------------- Multi Wound Chart Details Patient Name: [name]. Date of Service: 12/16/2015 12:45 PM Medical Record Number: [id number] Patient Account Number: [id number] Date of Birth/Sex: 1966-09-27 (50 y.o. Female) Treating RN: Clover Mealy, RN, BSN, [place] Sink Primary Care Physician: Ruthe Mannan Other Clinician: Referring Physician: Ruthe Mannan Treating Physician/Extender: Rudene Re in Treatment: 4 Vital Signs Height(in): 68 Pulse(bpm): 88 Weight(lbs): 170 Blood Pressure 128/84 (mmHg): Body Mass Index(BMI): 26 Temperature(F): 98.4 Respiratory Rate 18 (breaths/min): Photos: [1:No Photos] [N/A:N/A] Wound Location: [1:Sacrum - Midline] [N/A:N/A] Wounding Event: [1:Pressure Injury] [N/A:N/A] Primary Etiology: [1:Pressure Ulcer] [N/A:N/A] Comorbid History: [1:Hypertension, History of pressure wounds, Paraplegia, Seizure Disorder] [N/A:N/A] Date Acquired: [1:09/06/2015] [N/A:N/A] Weeks of Treatment: [1:4] [N/A:N/A] Wound Status: [1:Open] [N/A:N/A] Measurements L x W x D 7.3x5x4.5 [N/A:N/A] (cm) Area (cm) : [1:28.667] [N/A:N/A] Volume (cm) : [1:129.002] [N/A:N/A] % Reduction in Area: [1:-21.30%] [N/A:N/A] % Reduction in Volume: -109.90% [N/A:N/A] Classification: [1:Category/Stage IV] [N/A:N/A] Exudate Amount: [1:Large] [N/A:N/A] Exudate Type: [1:Serosanguineous] [N/A:N/A] Exudate Color: [1:red, brown] [N/A:N/A] Foul Odor After [1:Yes] [N/A:N/A] Cleansing: Odor Anticipated Due to No [N/A:N/A] Product Use: Wound Margin: [1:Distinct, outline attached] [N/A:N/A] Granulation Amount: [1:Large (67-100%)] [N/A:N/A] Granulation Quality: [1:Red, Pink] [N/A:N/A] Necrotic Amount: [1:Small (1-33%)]  [N/A:N/A] Exposed Structures: [N/A:N/A] Fascia: No Fat: No Tendon: No Muscle: No Joint: No Bone: No Limited to Skin Breakdown Epithelialization: None N/A N/A Periwound Skin Texture: Edema: No N/A N/A Excoriation: No Induration: No Callus: No Crepitus: No Fluctuance: No Friable: No Rash: No Scarring: No Periwound Skin Maceration: Yes N/A N/A Moisture: Moist: Yes Dry/Scaly: No Periwound Skin Color: Atrophie Blanche: No N/A N/A Cyanosis: No Ecchymosis: No Erythema: No Hemosiderin Staining: No Mottled: No Pallor: No Rubor: No Temperature: No Abnormality N/A N/A Tenderness on No N/A N/A Palpation: Wound Preparation: Ulcer Cleansing: N/A N/A Rinsed/Irrigated with Saline Topical Anesthetic Applied: Other: lidocaine 4% Treatment Notes Electronic Signature(s)  Signed: 12/16/2015 3:32:51 PM By: Elpidio Eric BSN, RN Entered By: Elpidio Eric on 12/16/2015 13:07:33 [name] ([id number]) -------------------------------------------------------------------------------- Multi-Disciplinary Care Plan Details Patient Name: [name], [name]. Date of Service: 12/16/2015 12:45 PM Medical Record Number: [id number] Patient Account Number: [id number] Date of Birth/Sex: 1965-12-23 (50 y.o. Female) Treating RN: Clover Mealy, RN, BSN, [place] Sink Primary Care Physician: Ruthe Mannan Other Clinician: Referring Physician: Ruthe Mannan Treating Physician/Extender: Rudene Re in Treatment: 4 Active Inactive Electronic Signature(s) Signed: 02/02/2016 11:37:14 AM By: Elpidio Eric BSN, RN Previous Signature: 12/16/2015 3:32:51 PM Version By: Elpidio Eric BSN, RN Entered By: Elpidio Eric on 02/02/2016 11:37:14 [name] ([id number]) -------------------------------------------------------------------------------- Pain Assessment Details Patient Name: [name]. Date of Service: 12/16/2015 12:45 PM Medical Record Number: [id number] Patient Account Number: [id number] Date of Birth/Sex: 1966-05-03  (50 y.o. Female) Treating RN: Clover Mealy, RN, BSN, [place] Sink Primary Care Physician: Ruthe Mannan Other Clinician: Referring Physician: Ruthe Mannan Treating Physician/Extender: Rudene Re in Treatment: 4 Active Problems Location of Pain Severity and Description of Pain Patient Has Paino Patient Unable to Respond Site Locations Pain Management and Medication Current Pain Management: Electronic Signature(s) Signed: 12/16/2015 3:32:51 PM By: Elpidio Eric BSN, RN Entered By: Elpidio Eric on 12/16/2015 12:56:49 [name] ([id number]) -------------------------------------------------------------------------------- Wound Assessment Details Patient Name: [name]. Date of Service: 12/16/2015 12:45 PM Medical Record Number: [id number] Patient Account Number: [id number] Date of Birth/Sex: Jan 10, 1966 (50 y.o. Female) Treating RN: Afful, RN, BSN, [place] Primary Care Physician: Ruthe Mannan Other Clinician: Referring Physician: Ruthe Mannan Treating Physician/Extender: Rudene Re in Treatment: 4 Wound Status Wound Number: 1 Primary Pressure Ulcer Etiology: Wound Location: Sacrum - Midline Wound Open Wounding Event: Pressure Injury Status: Date Acquired: 09/06/2015 Comorbid Hypertension, History of pressure Weeks Of Treatment: 4 History: wounds, Paraplegia, Seizure Disorder Clustered Wound: No Photos Photo Uploaded By: Elpidio Eric on 12/16/2015 15:29:32 Wound Measurements Length: (cm) 7.3 Width: (cm) 5 Depth: (cm) 4.5 Area: (cm) 28.667 Volume: (cm) 129.002 % Reduction in Area: -21.3% % Reduction in Volume: -109.9% Epithelialization: None Tunneling: No Undermining: No Wound Description Classification: Category/Stage IV Foul Odor Aft Wound Margin: Distinct, outline attached Due to Produc Exudate Amount: Large Exudate Type: Serosanguineous Exudate Color: red, brown er Cleansing: Yes t Use: No Wound Bed Granulation Amount: Large (67-100%) Exposed  Structure Granulation Quality: Red, Pink Fascia Exposed: No Necrotic Amount: Small (1-33%) Fat Layer Exposed: No Tendon Exposed: No [name], Meghan M. ([id number]) Muscle Exposed: No Joint Exposed: No Bone Exposed: No Limited to Skin Breakdown Periwound Skin Texture Texture Color No Abnormalities Noted: No No Abnormalities Noted: No Callus: No Atrophie Blanche: No Crepitus: No Cyanosis: No Excoriation: No Ecchymosis: No Fluctuance: No Erythema: No Friable: No Hemosiderin Staining: No Induration: No Mottled: No Localized Edema: No Pallor: No Rash: No Rubor: No Scarring: No Temperature / Pain Moisture Temperature: No Abnormality No Abnormalities Noted: No Dry / Scaly: No Maceration: Yes Moist: Yes Wound Preparation Ulcer Cleansing: Rinsed/Irrigated with Saline Topical Anesthetic Applied: Other: lidocaine 4%, Electronic Signature(s) Signed: 12/16/2015 3:32:51 PM By: Elpidio Eric BSN, RN Entered By: Elpidio Eric on 12/16/2015 13:07:19 [name] ([id number]) -------------------------------------------------------------------------------- Vitals Details Patient Name: [name]. Date of Service: 12/16/2015 12:45 PM Medical Record Number: [id number] Patient Account Number: [id number] Date of Birth/Sex: 08-26-1966 (50 y.o. Female) Treating RN: Clover Mealy, RN, BSN, [place] Sink Primary Care Physician: Ruthe Mannan Other Clinician: Referring Physician: Ruthe Mannan Treating Physician/Extender: Rudene Re in Treatment: 4 Vital Signs Time Taken: 12:59 Temperature (F): 98.4 Height (in): 68 Pulse (bpm):  88 Weight (lbs): 170 Respiratory Rate (breaths/min): 18 Body Mass Index (BMI): 25.8 Blood Pressure (mmHg): 128/84 Reference Range: 80 - 120 mg / dl Electronic Signature(s) Signed: 12/16/2015 3:32:51 PM By: Elpidio Eric BSN, RN Entered By: Elpidio Eric on 12/16/2015 12:59:34

## 2015-12-17 NOTE — Progress Notes (Addendum)
[name] ([id number]) Visit Report for 12/16/2015 Chief Complaint Document Details Patient Name: [name], [name]. Date of Service: 12/16/2015 12:45 PM Medical Record Number: [id number] Patient Account Number: [id number] Date of Birth/Sex: 1966-09-17 (50 y.o. Female) Treating RN: Clover Mealy, RN, BSN, [place] Sink Primary Care Physician: Ruthe Mannan Other Clinician: Referring Physician: Ruthe Mannan Treating Physician/Extender: Rudene Re in Treatment: 4 Information Obtained from: Patient Chief Complaint Patient is at the clinic for treatment of an open pressure ulcer she's had for over 6 months now. Electronic Signature(s) Signed: 12/16/2015 1:13:38 PM By: Evlyn Kanner MD, FACS Entered By: Evlyn Kanner on 12/16/2015 13:13:38 [name] ([id number]) -------------------------------------------------------------------------------- HPI Details Patient Name: [name]. Date of Service: 12/16/2015 12:45 PM Medical Record Number: [id number] Patient Account Number: [id number] Date of Birth/Sex: 10/02/1966 (50 y.o. Female) Treating RN: Clover Mealy, RN, BSN, [place] Sink Primary Care Physician: Ruthe Mannan Other Clinician: Referring Physician: Ruthe Mannan Treating Physician/Extender: Rudene Re in Treatment: 4 History of Present Illness Location: sacral decubitus ulcer Quality: Patient reports No Pain. Severity: Patient states wound (s) are getting better. 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 Recently, in December she was profoundly hypotensive and found to have several medical issues and was admitted to [facility]. She was admitted between December 7 and 10/26/2015. 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 she also had failure to thrive and hence the family members opted to have a PEG tube placed. she also had a PICC line placed and was on IV antibiotics which included vancomycin and ceftriaxone to be given until January 27. At present she is also on tube feeds and a bowel regimen for constipation. 12/02/2015 -- she has had her wound VAC in position since last week and has been doing well with this. Electronic Signature(s) Signed: 12/16/2015 1:13:46 PM By: Evlyn Kanner MD, FACS Entered By: Evlyn Kanner on 12/16/2015 13:13:46 [name] ([id number]) -------------------------------------------------------------------------------- Physical Exam Details Patient Name: [name]. Date of Service: 12/16/2015 12:45 PM Medical Record Number: [id number] Patient Account Number: [id number] Date of Birth/Sex: [birth year] (50 y.o. Female) Treating RN: Clover Mealy, RN, BSN, [place] Sink Primary Care Physician: Ruthe Mannan Other Clinician: Referring Physician: Ruthe Mannan Treating Physician/Extender: Rudene Re in Treatment: 4 Constitutional . Pulse regular. Respirations normal and unlabored. Afebrile. . Eyes Nonicteric. Reactive to light. Ears, Nose, Mouth, and Throat Lips, teeth, and gums WNL.Marland Kitchen Moist mucosa without lesions. Neck supple and nontender. No palpable supraclavicular or cervical adenopathy. Normal sized without goiter. Respiratory WNL. No retractions.. Cardiovascular Pedal Pulses WNL. No clubbing, cyanosis or edema. Gastrointestinal (GI) Abdomen without masses or tenderness.. No liver or spleen enlargement or tenderness.. Lymphatic No adneopathy. No adenopathy. No adenopathy. Musculoskeletal Adexa without tenderness or enlargement.. Digits and nails w/o clubbing,  cyanosis, infection, petechiae, ischemia, or inflammatory conditions.. Integumentary (Hair, Skin) No suspicious lesions. No crepitus or fluctuance. No peri-wound warmth or erythema. No masses.Marland Kitchen Psychiatric Judgement and insight Intact.. No evidence of depression, anxiety, or agitation.. Notes the edges of the wound is looking good and no curettage was done. The base of the wound and the  surrounding skin is as before. There is no probing down to bone. Electronic Signature(s) Signed: 12/16/2015 1:14:32 PM By: Evlyn Kanner MD, FACS Entered By: Evlyn Kanner on 12/16/2015 13:14:31 [name] ([id number]) -------------------------------------------------------------------------------- Physician Orders Details Patient Name: [name]. Date of Service: 12/16/2015 12:45 PM Medical Record Number: [id number] Patient Account Number: [id number] Date of Birth/Sex: 11-09-1965 (50 y.o. Female) Treating RN: Clover Mealy, RN, BSN, [place] Sink Primary Care Physician: Ruthe Mannan Other Clinician: Referring Physician: Ruthe Mannan Treating Physician/Extender: Rudene Re in Treatment: 4 Verbal / Phone Orders: Yes Clinician: Afful, RN, BSN, Rita Read Back and Verified: Yes Diagnosis Coding Wound Cleansing Wound #1 Midline Sacrum o Clean wound with Normal Saline. Skin Barriers/Peri-Wound Care Wound #1 Midline Sacrum o Skin Prep Dressing Change Frequency Wound #1 Midline Sacrum o Change Dressing Monday, Wednesday, Friday Follow-up Appointments Wound #1 Midline Sacrum o Return Appointment in: - 3 weeks Off-Loading Wound #1 Midline Sacrum o Mattress Negative Pressure Wound Therapy Wound #1 Midline Sacrum o Wound VAC settings at 125/130 mmHg continuous pressure. Use BLACK/GREEN foam to wound cavity. Use WHITE foam to fill any tunnel/s and/or undermining. Change VAC dressing 3 X WEEK. Change canister as indicated when full. Nurse may titrate settings and frequency of dressing changes  as clinically indicated. - All VAC changes are to be done by SNF. [facility] center will apply VAC as needed in respect to productivity/Courtesy. VAC applied today at clinic by this RN. o Home Health Nurse may d/c VAC for s/s of increased infection, significant wound regression, or uncontrolled drainage. Notify Wound Healing Center at [phone]. o Number of foam/gauze pieces used in the dressing = - 3 pieces of white foam used to fill the underminings. one black foam use. Electronic Signature(s) Signed: 12/16/2015 1:48:53 PM By: Elpidio Eric BSN, RN [name] ([id number]) Signed: 12/16/2015 3:57:16 PM By: Evlyn Kanner MD, FACS Entered By: Elpidio Eric on 12/16/2015 13:48:53 [name] ([id number]) -------------------------------------------------------------------------------- Problem List Details Patient Name: [name], [name]. Date of Service: 12/16/2015 12:45 PM Medical Record Number: [id number] Patient Account Number: [id number] Date of Birth/Sex: [birth year] (50 y.o. 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 Date of Service: 12/16/2015 12:45 PM Medical Record Number: [id number] Patient Account Number: [id number] Date of Birth/Sex: 1966-06-05 (50 y.o. Female) Treating RN:  Clover Mealy, RN, BSN,  Sink Primary Care Physician: Ruthe Mannan Other Clinician: Referring Physician: Ruthe Mannan Treating Physician/Extender: Rudene Re in Treatment: 4 Subjective Chief Complaint Information obtained from Patient Patient is at the clinic for treatment of an open pressure ulcer she's had for over 6 months now. History of Present Illness (HPI) The following HPI elements were documented for the patient's wound: Location: sacral decubitus ulcer Quality: Patient reports No Pain. Severity: Patient states wound (s) are getting better. Duration: Patient has had the wound for > 6 months prior to seeking treatment at the wound center Context: The wound appeared gradually over time Modifying Factors: Other treatment(s) tried include:on IV antibiotics via a PICC line 50 year old female who is known to be  paraplegic following a ruptured cerebral aneurysm in 2014. Recently, in December she was profoundly hypotensive and found to have several medical issues and was admitted to [facility]. She was admitted between December 7 and 10/26/2015. 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 she also had failure to thrive and hence the family members opted to have a PEG tube placed. she also had a PICC line placed and was on IV antibiotics which included vancomycin and ceftriaxone to be given until January 27. At present she is also on tube feeds and a bowel regimen for constipation. 12/02/2015 -- she has had her wound VAC in position since last week and has been doing well with this. Objective Constitutional Pulse regular. Respirations normal and unlabored. Afebrile. Vitals Time Taken: 12:59 PM, Height: 68 in, Weight: 170 lbs, BMI: 25.8, Temperature: 98.4 F, Pulse: 88 bpm, Respiratory Rate: 18  breaths/min, Blood Pressure: 128/84 mmHg. [name], [name]. ([id number]) Eyes Nonicteric. Reactive to light. Ears, Nose, Mouth, and Throat Lips, teeth, and gums WNL.Marland Kitchen Moist mucosa without lesions. Neck supple and nontender. No palpable supraclavicular or cervical adenopathy. Normal sized without goiter. Respiratory WNL. No retractions.. Cardiovascular Pedal Pulses WNL. No clubbing, cyanosis or edema. Gastrointestinal (GI) Abdomen without masses or tenderness.. No liver or spleen enlargement or tenderness.. Lymphatic No adneopathy. No adenopathy. No adenopathy. Musculoskeletal Adexa without tenderness or enlargement.. Digits and nails w/o clubbing, cyanosis, infection, petechiae, ischemia, or inflammatory conditions.Marland Kitchen Psychiatric Judgement and insight Intact.. No evidence of depression, anxiety, or agitation.. General Notes: the edges of the wound is looking good and no curettage was done. The base of the wound and the surrounding skin is as before. There is no probing down to bone. Integumentary (Hair, Skin) No suspicious lesions. No crepitus or fluctuance. No peri-wound warmth or erythema. No masses.. Wound #1 status is Open. Original cause of wound was Pressure Injury. The wound is located on the Midline Sacrum. The wound measures 7.3cm length x 5cm width x 4.5cm depth; 28.667cm^2 area and 129.002cm^3 volume. The wound is limited to skin breakdown. There is no tunneling or undermining noted. There is a large amount of serosanguineous drainage noted. The wound margin is distinct with the outline attached to the wound base. There is large (67-100%) red, pink granulation within the wound bed. There is a small (1-33%) amount of necrotic tissue within the wound bed. The periwound skin appearance exhibited: Maceration, Moist. The periwound skin appearance did not exhibit: Callus, Crepitus, Excoriation, Fluctuance, Friable, Induration, Localized Edema, Rash, Scarring, Dry/Scaly, Atrophie  Blanche, Cyanosis, Ecchymosis, Hemosiderin Staining, Mottled, Pallor, Rubor, Erythema. Periwound temperature was noted as No Abnormality. Assessment [name], [name] ([id number]) Active Problems ICD-10 L89.154 - Pressure ulcer of sacral region, stage 4 E44.0 - Moderate protein-calorie malnutrition G82.21 - Paraplegia, complete Plan Wound Cleansing: Wound #1 Midline Sacrum: Clean wound with Normal Saline. Skin Barriers/Peri-Wound Care: Wound #1 Midline Sacrum: Skin Prep Dressing Change Frequency: Wound #1 Midline Sacrum: Change Dressing Monday, Wednesday, Friday Follow-up Appointments: Wound #1 Midline Sacrum: Return Appointment in: - 3 weeks Off-Loading: Wound #1 Midline Sacrum: Mattress Negative Pressure Wound Therapy: Wound #1 Midline Sacrum: Wound VAC settings at 125/130 mmHg continuous pressure. Use BLACK/GREEN foam to wound cavity. Use WHITE foam to fill any tunnel/s and/or undermining. Change VAC dressing 3 X WEEK. Change canister as indicated when full. Nurse may titrate settings and frequency of  dressing changes as clinically indicated. - All VAC changes are to be done by SNF. [facility] center will apply VAC as needed in respect to productivity/Courtesy. VAC applied today at clinic by this RN. Home Health Nurse may d/c VAC for s/s of increased infection, significant wound regression, or uncontrolled drainage. Notify Wound Healing Center at [phone]. Number of foam/gauze pieces used in the dressing = - 3 pieces of white foam used to fill the underminings. one black foam use. I have recommended: [name], [name]. ([id number]) 1. application of wound VAC to be changed 3 times a week 2. Nutritional support with high protein intake, multivitamins including vitamin C and zinc 3. Air mattress and appropriate offloading with constant change in position. 4. Regular visits to the wound care center. She can spread it out to every other week or every 3 weeks as they can 5. A plastic  surgery opinion at a later date. Electronic Signature(s) Signed: 12/16/2015 4:02:36 PM By: Evlyn Kanner MD, FACS Previous Signature: 12/16/2015 1:15:32 PM Version By: Evlyn Kanner MD, FACS Previous Signature: 12/16/2015 1:14:46 PM Version By: Evlyn Kanner MD, FACS Entered By: Evlyn Kanner on 12/16/2015 16:02:36 [name] ([id number]) -------------------------------------------------------------------------------- SuperBill Details Patient Name: [name]. Date of Service: 12/16/2015 Medical Record Number: [id number] Patient Account Number: [id number] Date of Birth/Sex: Mar 30, 1966 (50 y.o. 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

## 2016-01-03 ENCOUNTER — Emergency Department (HOSPITAL_COMMUNITY): Payer: Medicaid Other

## 2016-01-03 ENCOUNTER — Inpatient Hospital Stay (HOSPITAL_COMMUNITY)
Admission: EM | Admit: 2016-01-03 | Discharge: 2016-01-11 | DRG: 689 | Disposition: A | Discharge status: Skilled Nursing Facility | Payer: Medicaid Other | Attending: Internal Medicine | Admitting: Internal Medicine

## 2016-01-03 ENCOUNTER — Encounter (HOSPITAL_COMMUNITY): Payer: Self-pay | Admitting: Family Medicine

## 2016-01-03 DIAGNOSIS — Z888 Allergy status to other drugs, medicaments and biological substances status: Secondary | ICD-10-CM | POA: Diagnosis not present

## 2016-01-03 DIAGNOSIS — E861 Hypovolemia: Secondary | ICD-10-CM | POA: Diagnosis present

## 2016-01-03 DIAGNOSIS — R791 Abnormal coagulation profile: Secondary | ICD-10-CM | POA: Diagnosis not present

## 2016-01-03 DIAGNOSIS — K625 Hemorrhage of anus and rectum: Secondary | ICD-10-CM | POA: Diagnosis present

## 2016-01-03 DIAGNOSIS — L89150 Pressure ulcer of sacral region, unstageable: Secondary | ICD-10-CM | POA: Diagnosis not present

## 2016-01-03 DIAGNOSIS — L89154 Pressure ulcer of sacral region, stage 4: Secondary | ICD-10-CM | POA: Diagnosis present

## 2016-01-03 DIAGNOSIS — K922 Gastrointestinal hemorrhage, unspecified: Secondary | ICD-10-CM | POA: Diagnosis present

## 2016-01-03 DIAGNOSIS — E870 Hyperosmolality and hypernatremia: Secondary | ICD-10-CM | POA: Diagnosis present

## 2016-01-03 DIAGNOSIS — Z91040 Latex allergy status: Secondary | ICD-10-CM | POA: Diagnosis not present

## 2016-01-03 DIAGNOSIS — A419 Sepsis, unspecified organism: Secondary | ICD-10-CM | POA: Diagnosis present

## 2016-01-03 DIAGNOSIS — K921 Melena: Secondary | ICD-10-CM | POA: Diagnosis not present

## 2016-01-03 DIAGNOSIS — Z8673 Personal history of transient ischemic attack (TIA), and cerebral infarction without residual deficits: Secondary | ICD-10-CM | POA: Diagnosis not present

## 2016-01-03 DIAGNOSIS — D649 Anemia, unspecified: Secondary | ICD-10-CM | POA: Diagnosis present

## 2016-01-03 DIAGNOSIS — R4701 Aphasia: Secondary | ICD-10-CM | POA: Diagnosis present

## 2016-01-03 DIAGNOSIS — R000 Tachycardia, unspecified: Secondary | ICD-10-CM | POA: Diagnosis present

## 2016-01-03 DIAGNOSIS — Z8249 Family history of ischemic heart disease and other diseases of the circulatory system: Secondary | ICD-10-CM | POA: Diagnosis not present

## 2016-01-03 DIAGNOSIS — I10 Essential (primary) hypertension: Secondary | ICD-10-CM | POA: Diagnosis present

## 2016-01-03 DIAGNOSIS — Z87891 Personal history of nicotine dependence: Secondary | ICD-10-CM | POA: Diagnosis not present

## 2016-01-03 DIAGNOSIS — B964 Proteus (mirabilis) (morganii) as the cause of diseases classified elsewhere: Secondary | ICD-10-CM | POA: Diagnosis present

## 2016-01-03 DIAGNOSIS — E860 Dehydration: Secondary | ICD-10-CM | POA: Diagnosis present

## 2016-01-03 DIAGNOSIS — N390 Urinary tract infection, site not specified: Secondary | ICD-10-CM | POA: Diagnosis present

## 2016-01-03 DIAGNOSIS — L89159 Pressure ulcer of sacral region, unspecified stage: Secondary | ICD-10-CM | POA: Diagnosis present

## 2016-01-03 DIAGNOSIS — G40909 Epilepsy, unspecified, not intractable, without status epilepticus: Secondary | ICD-10-CM | POA: Diagnosis present

## 2016-01-03 DIAGNOSIS — Z880 Allergy status to penicillin: Secondary | ICD-10-CM

## 2016-01-03 DIAGNOSIS — R6510 Systemic inflammatory response syndrome (SIRS) of non-infectious origin without acute organ dysfunction: Secondary | ICD-10-CM

## 2016-01-03 DIAGNOSIS — R569 Unspecified convulsions: Secondary | ICD-10-CM

## 2016-01-03 DIAGNOSIS — I82403 Acute embolism and thrombosis of unspecified deep veins of lower extremity, bilateral: Secondary | ICD-10-CM | POA: Diagnosis not present

## 2016-01-03 DIAGNOSIS — I2699 Other pulmonary embolism without acute cor pulmonale: Secondary | ICD-10-CM

## 2016-01-03 DIAGNOSIS — B9689 Other specified bacterial agents as the cause of diseases classified elsewhere: Secondary | ICD-10-CM | POA: Diagnosis present

## 2016-01-03 DIAGNOSIS — R7989 Other specified abnormal findings of blood chemistry: Secondary | ICD-10-CM

## 2016-01-03 DIAGNOSIS — K2901 Acute gastritis with bleeding: Secondary | ICD-10-CM | POA: Diagnosis present

## 2016-01-03 LAB — URINE MICROSCOPIC-ADD ON: RBC / HPF: NONE SEEN RBC/hpf (ref 0–5)

## 2016-01-03 LAB — I-STAT CG4 LACTIC ACID, ED: Lactic Acid, Venous: 2.06 mmol/L (ref 0.5–2.0)

## 2016-01-03 LAB — COMPREHENSIVE METABOLIC PANEL
ALBUMIN: 2.2 g/dL — AB (ref 3.5–5.0)
ALK PHOS: 67 U/L (ref 38–126)
ALT: 19 U/L (ref 14–54)
AST: 17 U/L (ref 15–41)
Anion gap: 13 (ref 5–15)
BUN: 42 mg/dL — AB (ref 6–20)
CALCIUM: 9.5 mg/dL (ref 8.9–10.3)
CHLORIDE: 118 mmol/L — AB (ref 101–111)
CO2: 22 mmol/L (ref 22–32)
CREATININE: 0.74 mg/dL (ref 0.44–1.00)
GFR calc non Af Amer: >60 mL/min (ref >60)
GLUCOSE: 97 mg/dL (ref 65–99)
Potassium: 4.8 mmol/L (ref 3.5–5.1)
SODIUM: 153 mmol/L — AB (ref 135–145)
Total Bilirubin: 0.3 mg/dL (ref 0.3–1.2)
Total Protein: 7 g/dL (ref 6.5–8.1)

## 2016-01-03 LAB — URINALYSIS, ROUTINE W REFLEX MICROSCOPIC
Bilirubin Urine: NEGATIVE
GLUCOSE, UA: NEGATIVE mg/dL
HGB URINE DIPSTICK: NEGATIVE
Ketones, ur: NEGATIVE mg/dL
Nitrite: NEGATIVE
PH: 8.5 — AB (ref 5.0–8.0)
Protein, ur: 30 mg/dL — AB
Specific Gravity, Urine: 1.016 (ref 1.005–1.030)

## 2016-01-03 LAB — TYPE AND SCREEN
ABO/RH(D): A POS
ANTIBODY SCREEN: NEGATIVE

## 2016-01-03 LAB — CBC WITH DIFFERENTIAL/PLATELET
BASOS PCT: 0 %
Basophils Absolute: 0 10*3/uL (ref 0.0–0.1)
EOS PCT: 2 %
Eosinophils Absolute: 0.2 10*3/uL (ref 0.0–0.7)
HEMATOCRIT: 27.6 % — AB (ref 36.0–46.0)
Hemoglobin: 8.4 g/dL — ABNORMAL LOW (ref 12.0–15.0)
Lymphocytes Relative: 22 %
Lymphs Abs: 2.4 10*3/uL (ref 0.7–4.0)
MCH: 28.3 pg (ref 26.0–34.0)
MCHC: 30.4 g/dL (ref 30.0–36.0)
MCV: 92.9 fL (ref 78.0–100.0)
MONO ABS: 0.7 10*3/uL (ref 0.1–1.0)
Monocytes Relative: 6 %
NEUTROS ABS: 7.8 10*3/uL — AB (ref 1.7–7.7)
NEUTROS PCT: 70 %
Platelets: 481 10*3/uL — ABNORMAL HIGH (ref 150–400)
RBC: 2.97 MIL/uL — ABNORMAL LOW (ref 3.87–5.11)
RDW: 17.9 % — AB (ref 11.5–15.5)
WBC: 11.1 10*3/uL — ABNORMAL HIGH (ref 4.0–10.5)

## 2016-01-03 LAB — BASIC METABOLIC PANEL
ANION GAP: 10 (ref 5–15)
BUN: 36 mg/dL — AB (ref 6–20)
CHLORIDE: 120 mmol/L — AB (ref 101–111)
CO2: 22 mmol/L (ref 22–32)
Calcium: 8.8 mg/dL — ABNORMAL LOW (ref 8.9–10.3)
Creatinine, Ser: 0.72 mg/dL (ref 0.44–1.00)
GFR calc Af Amer: >60 mL/min (ref >60)
GLUCOSE: 95 mg/dL (ref 65–99)
POTASSIUM: 4.1 mmol/L (ref 3.5–5.1)
Sodium: 152 mmol/L — ABNORMAL HIGH (ref 135–145)

## 2016-01-03 LAB — POC OCCULT BLOOD, ED: Fecal Occult Bld: POSITIVE — AB

## 2016-01-03 LAB — LACTIC ACID, PLASMA: LACTIC ACID, VENOUS: 1.8 mmol/L (ref 0.5–2.0)

## 2016-01-03 MED ORDER — BISACODYL 10 MG RE SUPP: 10.0000 mg | Freq: Every day | RECTAL | Status: DC | PRN

## 2016-01-03 MED ORDER — FERROUS SULFATE 300 (60 FE) MG/5ML PO SYRP
300.0000 mg | ORAL_SOLUTION | Freq: Two times a day (BID) | ORAL | Status: DC
  Administered 2016-01-04 – 2016-01-11 (×15): 300 mg
  Filled 2016-01-03 (×14): qty 5

## 2016-01-03 MED ORDER — HYDROMORPHONE HCL 1 MG/ML IJ SOLN
0.5000 mg | INTRAMUSCULAR | Status: DC | PRN
  Administered 2016-01-04 – 2016-01-07 (×3): 0.5 mg via INTRAVENOUS
  Filled 2016-01-03 (×3): qty 1

## 2016-01-03 MED ORDER — LACOSAMIDE 10 MG/ML PO SOLN: 100.0000 mg | Freq: Two times a day (BID) | ORAL | Status: DC

## 2016-01-03 MED ORDER — ACETAMINOPHEN 650 MG RE SUPP
650.0000 mg | Freq: Four times a day (QID) | RECTAL | Status: DC | PRN
  Administered 2016-01-04: 650 mg via RECTAL
  Filled 2016-01-03: qty 1

## 2016-01-03 MED ORDER — VANCOMYCIN HCL IN DEXTROSE 1-5 GM/200ML-% IV SOLN: 1000.0000 mg | Freq: Once | INTRAVENOUS | Status: DC

## 2016-01-03 MED ORDER — POLYVINYL ALCOHOL 1.4 % OP SOLN
2.0000 [drp] | Freq: Three times a day (TID) | OPHTHALMIC | Status: DC
Start: 2016-01-03 — End: 2016-01-11
  Administered 2016-01-04 – 2016-01-11 (×23): 2 [drp] via OPHTHALMIC
  Filled 2016-01-03: qty 15

## 2016-01-03 MED ORDER — CHOLESTYRAMINE LIGHT 4 G PO PACK
4.0000 g | PACK | Freq: Three times a day (TID) | ORAL | Status: DC
  Administered 2016-01-04 (×2): 4 g via ORAL
  Filled 2016-01-03 (×4): qty 1

## 2016-01-03 MED ORDER — VITAMIN D 1000 UNITS PO TABS
5000.0000 [IU] | ORAL_TABLET | Freq: Every day | ORAL | Status: DC
  Administered 2016-01-04: 5000 [IU] via ORAL
  Filled 2016-01-03: qty 5

## 2016-01-03 MED ORDER — SODIUM CHLORIDE 0.9 % IV SOLN
1.0000 g | Freq: Three times a day (TID) | INTRAVENOUS | Status: DC
  Administered 2016-01-04 – 2016-01-05 (×5): 1 g via INTRAVENOUS
  Filled 2016-01-03 (×8): qty 1

## 2016-01-03 MED ORDER — PIPERACILLIN-TAZOBACTAM 3.375 G IVPB 30 MIN
3.3750 g | Freq: Once | INTRAVENOUS | Status: AC
  Administered 2016-01-03: 3.375 g via INTRAVENOUS
  Filled 2016-01-03: qty 50

## 2016-01-03 MED ORDER — ACETAMINOPHEN 325 MG PO TABS: 650.0000 mg | ORAL_TABLET | Freq: Four times a day (QID) | ORAL | Status: DC | PRN

## 2016-01-03 MED ORDER — PRO-STAT SUGAR FREE PO LIQD
60.0000 mL | Freq: Two times a day (BID) | ORAL | Status: DC
  Administered 2016-01-04 – 2016-01-11 (×16): 60 mL
  Filled 2016-01-03 (×16): qty 60

## 2016-01-03 MED ORDER — TRAZODONE HCL 50 MG PO TABS
25.0000 mg | ORAL_TABLET | Freq: Every evening | ORAL | Status: DC | PRN
  Administered 2016-01-04: 25 mg via ORAL
  Filled 2016-01-03: qty 1

## 2016-01-03 MED ORDER — VANCOMYCIN HCL IN DEXTROSE 750-5 MG/150ML-% IV SOLN
750.0000 mg | Freq: Two times a day (BID) | INTRAVENOUS | Status: DC
  Administered 2016-01-04 – 2016-01-05 (×3): 750 mg via INTRAVENOUS
  Filled 2016-01-03 (×4): qty 150

## 2016-01-03 MED ORDER — PIPERACILLIN-TAZOBACTAM 3.375 G IVPB: 3.3750 g | Freq: Three times a day (TID) | INTRAVENOUS | Status: DC

## 2016-01-03 MED ORDER — ONDANSETRON HCL 4 MG/2ML IJ SOLN: 4.0000 mg | Freq: Four times a day (QID) | INTRAMUSCULAR | Status: DC | PRN

## 2016-01-03 MED ORDER — SODIUM CHLORIDE 0.9% FLUSH
3.0000 mL | Freq: Two times a day (BID) | INTRAVENOUS | Status: DC
  Administered 2016-01-04: 10 mL via INTRAVENOUS
  Administered 2016-01-04: 3 mL via INTRAVENOUS

## 2016-01-03 MED ORDER — PANTOPRAZOLE SODIUM 40 MG IV SOLR
40.0000 mg | Freq: Two times a day (BID) | INTRAVENOUS | Status: DC
  Administered 2016-01-04 – 2016-01-07 (×7): 40 mg via INTRAVENOUS
  Filled 2016-01-03 (×8): qty 40

## 2016-01-03 MED ORDER — SODIUM CHLORIDE 0.9 % IV SOLN
INTRAVENOUS | Status: DC
  Administered 2016-01-04: 01:00:00 via INTRAVENOUS

## 2016-01-03 MED ORDER — LEVETIRACETAM 100 MG/ML PO SOLN
1500.0000 mg | Freq: Two times a day (BID) | ORAL | Status: DC
  Administered 2016-01-04 – 2016-01-11 (×16): 1500 mg
  Filled 2016-01-03 (×16): qty 15

## 2016-01-03 MED ORDER — PANTOPRAZOLE SODIUM 40 MG IV SOLR
80.0000 mg | Freq: Once | INTRAVENOUS | Status: AC
  Administered 2016-01-04: 80 mg via INTRAVENOUS
  Filled 2016-01-03 (×2): qty 80

## 2016-01-03 MED ORDER — DOCUSATE SODIUM 100 MG PO CAPS: 100.0000 mg | ORAL_CAPSULE | Freq: Two times a day (BID) | ORAL | Status: DC

## 2016-01-03 MED ORDER — VANCOMYCIN HCL IN DEXTROSE 750-5 MG/150ML-% IV SOLN: 750.0000 mg | Freq: Two times a day (BID) | INTRAVENOUS | Status: DC

## 2016-01-03 MED ORDER — SODIUM CHLORIDE 0.9 % IV BOLUS (SEPSIS)
1000.0000 mL | INTRAVENOUS | Status: DC
  Administered 2016-01-03 (×2): 1000 mL via INTRAVENOUS

## 2016-01-03 MED ORDER — VANCOMYCIN HCL 10 G IV SOLR
1500.0000 mg | Freq: Once | INTRAVENOUS | Status: AC
  Administered 2016-01-03: 1500 mg via INTRAVENOUS
  Filled 2016-01-03: qty 1500

## 2016-01-03 MED ORDER — ADULT MULTIVITAMIN W/MINERALS CH
1.0000 | ORAL_TABLET | Freq: Every day | ORAL | Status: DC
  Administered 2016-01-04 – 2016-01-11 (×8): 1
  Filled 2016-01-03 (×8): qty 1

## 2016-01-03 MED ORDER — FREE WATER
200.0000 mL | Freq: Three times a day (TID) | Status: DC
  Administered 2016-01-04 (×3): 200 mL via ORAL

## 2016-01-03 MED ORDER — POLYETHYLENE GLYCOL 3350 17 G PO PACK
17.0000 g | PACK | Freq: Every day | ORAL | Status: DC
  Administered 2016-01-04: 17 g via ORAL
  Filled 2016-01-03: qty 1

## 2016-01-03 MED ORDER — ONDANSETRON HCL 4 MG PO TABS: 4.0000 mg | ORAL_TABLET | Freq: Four times a day (QID) | ORAL | Status: DC | PRN

## 2016-01-03 NOTE — Progress Notes (Signed)
Came to assess per RN request found pt, non-verbal. Seen she has history of CVA and cerebral aneurysm with subsequent seizure disorder with nonverbal state. Pt is satting well on a 4L [place] at 100%. Pt seems to be in no distress at this time. Made RN aware of my findings.

## 2016-01-03 NOTE — Progress Notes (Signed)
Pharmacy Code Sepsis Protocol  Time of code sepsis page: 1815  Antibiotics delivered at 1816  Were antibiotics ordered at the time of the code sepsis page? Yes Was it required to contact the physician?  Physician not contacted  Physician contacted to order antibiotics for code sepsis  Physician contacted to recommend changing antibiotics  Pharmacy consulted for: Zosyn and vancomycin  Anti-infectives    Start     Dose/Rate Route Frequency Ordered Stop   01/03/16 1815  piperacillin-tazobactam (ZOSYN) IVPB 3.375 g     3.375 g 100 mL/hr over 30 Minutes Intravenous  Once 01/03/16 1811     01/03/16 1815  vancomycin (VANCOCIN) IVPB 1000 mg/200 mL premix  Status:  Discontinued     1,000 mg 200 mL/hr over 60 Minutes Intravenous  Once 01/03/16 1811 01/03/16 1813   01/03/16 1815  vancomycin (VANCOCIN) 1,500 mg in sodium chloride 0.9 % 500 mL IVPB     1,500 mg 250 mL/hr over 120 Minutes Intravenous  Once 01/03/16 1813          Nurse education provided:  Minutes left to administer antibiotics to achieve 1 hour goal  Correct order of antibiotic administration  Antibiotic Y-site compatibilities     Enzo Bi, PharmD, BCPS Clinical Pharmacist Pager [phone] 01/03/2016 6:20 PM

## 2016-01-03 NOTE — ED Provider Notes (Signed)
CSN: [id number]     Arrival date & time    History   First MD Initiated Contact with Patient 01/03/16 1752     Chief Complaint  Patient presents with  . Rectal Bleeding   Patient is a 50 y.o. female presenting with general illness. The history is provided by the EMS personnel. No language interpreter was used.  Illness Location:  Rectum Quality:  Bleeding Severity:  Mild Onset quality:  Gradual Duration:  1 day Timing:  Intermittent Progression:  Partially resolved Chronicity:  New Relieved by:  None Worsened by:  None Ineffective treatments:  None Associated symptoms: no congestion, no cough, no diarrhea, no nausea, no shortness of breath and no vomiting     No past medical history on file. Past Surgical History  Procedure Laterality Date  . Abdominal hysterectomy    . Cholecystectomy    . Flexible sigmoidoscopy N/A 10/25/2015    Procedure: FLEXIBLE SIGMOIDOSCOPY;  Surgeon: Carman Ching, MD;  Location: [facility] ENDOSCOPY;  Service: Endoscopy;  Laterality: N/A;   Family History  Problem Relation Age of Onset  . Heart disease Mother 50    MI   Social History  Substance Use Topics  . Smoking status: Former Smoker    Types: Cigarettes  . Smokeless tobacco: Never Used     Comment: trying to quit-2 cigarettes daily  . Alcohol Use: No     Comment: Very seldom   OB History    No data available     Review of Systems  Unable to perform ROS: Patient nonverbal  HENT: Negative for congestion.   Respiratory: Negative for cough and shortness of breath.   Gastrointestinal: Negative for nausea, vomiting and diarrhea.      Allergies  Amlodipine; Penicillins; Latex; and Other  Home Medications   Prior to Admission medications   Medication Sig Start Date End Date Taking? Authorizing Provider  acetaminophen (TYLENOL) 325 MG tablet Take 2 tablets (650 mg total) by mouth daily. Patient taking differently: Take 650 mg by mouth daily as needed for moderate pain or fever. 2  tablets given every day, may take additional tablets PRN for fever above 100 F 03/22/15   Hollice Espy, MD  Amino Acids-Protein Hydrolys (FEEDING SUPPLEMENT, PRO-STAT SUGAR FREE 64,) LIQD Place 60 mLs into feeding tube 2 (two) times daily. 10/26/15   Jeralyn Bennett, MD  antiseptic oral rinse (CPC / CETYLPYRIDINIUM CHLORIDE 0.05%) 0.05 % LIQD solution 7 mLs by Mouth Rinse route 2 (two) times daily. 12/08/15   Alison Murray, MD  Ascorbic Acid (VITAMIN C) 1000 MG tablet Give 1,000 mg by tube daily.     Historical Provider, MD  carboxymethylcellulose (REFRESH PLUS) 0.5 % SOLN Place 2 drops into both eyes 3 (three) times daily.    Historical Provider, MD  Cholecalciferol (VITAMIN D3) 5000 UNITS TABS Take 5,000 Units by mouth daily with breakfast.     Historical Provider, MD  cholestyramine light (PREVALITE) 4 G packet Take 4 g by mouth 3 (three) times daily. mix in 8 oz. Of Liquid    Historical Provider, MD  famotidine (PEPCID) 40 MG/5ML suspension Take 2.5 mLs (20 mg total) by mouth every 12 (twelve) hours. 03/22/15   Hollice Espy, MD  ferrous sulfate 300 (60 FE) MG/5ML syrup Take 5 mLs (300 mg total) by mouth 2 (two) times daily with a meal. 09/06/15   Kathlen Mody, MD  Heparin Lock Flush (HEPARIN, PORCINE, LOCK FLUSH IV) Inject into the vein every 12 (twelve) hours.  Historical Provider, MD  Lacosamide (VIMPAT) 100 MG TABS Take 100 mg by mouth 2 (two) times daily.    Historical Provider, MD  levETIRAcetam (KEPPRA) 100 MG/ML solution Take 15 mLs (1,500 mg total) by mouth 2 (two) times daily. 10/26/15   Jeralyn Bennett, MD  lisinopril (PRINIVIL,ZESTRIL) 10 MG tablet Give 10 mg by tube daily.     Historical Provider, MD  loperamide (IMODIUM A-D) 2 MG tablet Take 4 mg by mouth 4 (four) times daily as needed for diarrhea or loose stools.    Historical Provider, MD  metoprolol (LOPRESSOR) 50 MG tablet Take 1 tablet (50 mg total) by mouth 2 (two) times daily. Patient taking differently: 50 mg by PEG  Tube route 2 (two) times daily.  10/26/15   Jeralyn Bennett, MD  Multiple Vitamin (MULTIVITAMIN WITH MINERALS) TABS tablet Give 1 tablet by tube daily with breakfast.     Historical Provider, MD  Nutritional Supplements (FEEDING SUPPLEMENT, JEVITY 1.5 CAL/FIBER,) LIQD Place 1,000 mLs into feeding tube continuous. 10/26/15   Jeralyn Bennett, MD  ondansetron (ZOFRAN) 4 MG tablet Take 1 tablet (4 mg total) by mouth every 6 (six) hours as needed for nausea. 12/08/15   Alison Murray, MD  polyethylene glycol [facility]) packet Take 17 g by mouth daily. Patient taking differently: Give 17 g by tube daily.  10/26/15   Jeralyn Bennett, MD  Potassium Chloride 25 MEQ PACK Place 25 mEq into feeding tube daily. 12/08/15   Alison Murray, MD  ranitidine (ZANTAC) 150 MG/10ML syrup Place 10 mLs (150 mg total) into feeding tube 2 (two) times daily. 12/08/15   Alison Murray, MD  traZODone (DESYREL) 50 MG tablet Take 25 mg by mouth at bedtime as needed for sleep.     Historical Provider, MD  Water For Irrigation, Sterile (FREE WATER) SOLN Take 200 mLs by mouth every 8 (eight) hours. 10/26/15   Jeralyn Bennett, MD   BP 90/62 mmHg  Pulse 108  Temp(Src) 99.3 F (37.4 C) (Rectal)  Resp 24  Ht 5\' 4"  (1.626 m)  Wt 85.73 kg  BMI 32.43 kg/m2  SpO2 100% Physical Exam  Constitutional: She appears well-developed and well-nourished. No distress.  HENT:  Head: Normocephalic and atraumatic.  Right Ear: External ear normal.  Left Ear: External ear normal.  Eyes: Pupils are equal, round, and reactive to light. Right eye exhibits no discharge. Left eye exhibits no discharge.  Neck: Normal range of motion. No JVD present. No tracheal deviation present.  Cardiovascular: Regular rhythm and normal heart sounds.  Tachycardia present.  Exam reveals no friction rub.   No murmur heard. Pulmonary/Chest: Effort normal and breath sounds normal. No stridor. No respiratory distress. She has no wheezes.  Abdominal: Soft. Bowel sounds are  normal. She exhibits no distension. There is no rebound and no guarding.  Genitourinary: Guaiac positive stool.  Musculoskeletal: Normal range of motion. She exhibits no edema or tenderness.  Lymphadenopathy:    She has no cervical adenopathy.  Neurological: No cranial nerve deficit. Coordination normal.  Alert but unable to evaluate orientation due to averbal status  Skin: Skin is warm and dry. No rash noted. No pallor.     Nursing note and vitals reviewed.   ED Course  Procedures (including critical care time) Labs Review Labs Reviewed  COMPREHENSIVE METABOLIC PANEL - Abnormal; Notable for the following:    Sodium 153 (*)    Chloride 118 (*)    BUN 42 (*)    Albumin 2.2 (*)  All other components within normal limits  CBC WITH DIFFERENTIAL/PLATELET - Abnormal; Notable for the following:    WBC 11.1 (*)    RBC 2.97 (*)    Hemoglobin 8.4 (*)    HCT 27.6 (*)    RDW 17.9 (*)    Platelets 481 (*)    Neutro Abs 7.8 (*)    All other components within normal limits  URINALYSIS, ROUTINE W REFLEX MICROSCOPIC (NOT AT [facility]) - Abnormal; Notable for the following:    APPearance CLOUDY (*)    pH 8.5 (*)    Protein, ur 30 (*)    Leukocytes, UA MODERATE (*)    All other components within normal limits  URINE MICROSCOPIC-ADD ON - Abnormal; Notable for the following:    Squamous Epithelial / LPF 0-5 (*)    Bacteria, UA MANY (*)    All other components within normal limits  I-STAT CG4 LACTIC ACID, ED - Abnormal; Notable for the following:    Lactic Acid, Venous 2.06 (*)    All other components within normal limits  POC OCCULT BLOOD, ED - Abnormal; Notable for the following:    Fecal Occult Bld POSITIVE (*)    All other components within normal limits  CULTURE, BLOOD (ROUTINE X 2)  CULTURE, BLOOD (ROUTINE X 2)  URINE CULTURE  I-STAT CG4 LACTIC ACID, ED  TYPE AND SCREEN    Imaging Review Dg Chest Port 1 View  01/03/2016  CLINICAL DATA:  50 year old female with sepsis and  tachypnea EXAM: PORTABLE CHEST 1 VIEW COMPARISON:  Radiograph dated 12/04/2015 FINDINGS: The right-sided PICC is retracted and the tip now lies in the proximal right upper extremity likely in the axillary or brachial vein. Recommend evaluation and adjustment. Single-view of the chest does not demonstrate a focal consolidation. The lungs are hypovolemic. There is no pleural effusion or pneumothorax. Stable cardiac silhouette. No acute osseous pathology. IMPRESSION: No acute cardiopulmonary process. Retracted right-sided PICC with tip in the proximal right arm. Recommend re-evaluation and repositioning. Electronically Signed   By: Elgie Collard M.D.   On: 01/03/2016 18:48   I have personally reviewed and evaluated these images and lab results as part of my medical decision-making.   EKG Interpretation None      MDM   Final diagnoses:  SIRS (systemic inflammatory response syndrome) (HCC)  Rectal bleeding    Patient with history of prior stroke. Patient is nonverbal at baseline and is confined to her bed.  She presents via EMS for evaluation of dark red blood from rectum. His has noticed this evening.  Upon arrival patient averbal. Temperature 99.3. Initial heart rate 124 in sinus on the monitor. Initial blood pressure 88/64. Patient's lungs sounded normal. She had a soft nontender abdomen. She had red blood on rectal exam. No gross bleeding from rectum.  Differential diagnosis includes sepsis versus symptomatic anemia versus dehydration.  Lab results show sodium of 153, chloride 118. I suspect dehydration however patient's lactate 2.06 and white blood cell count 11.1.  Patient immediately given 30 mL/kg of IV fluids, vancomycin and Zosyn for unclear source of infection. She had SIRS criteria for tachypnea, tachycardia upon arrival. Patient's heart rate improved following fluids.  Chest x-ray with no acute cardio point process. PICC line retracted, will need to be addressed as  inpatient.  UA cloudy with 6-30 white blood cells, many bacteria. Potentially source of infection.  Patient dehydrated on labs, UTI. Hemoglobin near baseline. With whole picture of dehydration, infection, ongoing rectal bleeding, patient will need  admission to hospitalist service for further evaluation and treatment.  Discussed with hospitalist who will admit to stepdown unit.  Discussed with Dr. Hyacinth Meeker.      Dan Humphreys, MD 01/03/16 [id number]  Eber Hong, MD 01/05/16 [phone]

## 2016-01-03 NOTE — H&P (Signed)
[facility] History and Physical  Darnetta Kesselman ZOX:[id number] DOB: [birth year] DOA: 01/03/2016  Referring physician: ED physician PCP: Ruthe Mannan, MD  Specialists:  Dr. Karel Jarvis (neurology)   Chief Complaint:  Dot Been blood per rectum   HPI: [name] is a 50 y.o. 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 Patient is chronically ill and was admitted to this institution from 12/03/2015 - 12/08/2015 with sepsis secondary to UTI with ESBL-producing Klebsiella bacteremia. There was also suspicion for bacterial infection of a stage IV decubitus sacral ulcer at that time. Patient was ultimately discharged back to her SNF then improved and stable condition and had initially done well per report of SNF personnel. Earlier in the day, SNF worker noted 10-15 mL dark red blood per rectum and EMS was activated for transport to the hospital. There were no other concerns or notable changes identified by the SNF personnel. Patient is nonverbal at baseline and unable to contribute to history.  In ED, patient was found to  be afebrile, saturating well on room air, with tachycardia to the 120s and blood pressure in the 80s systolic. Chest x-ray was obtained and negative for acute cardiopulmonary disease. Initial blood work is notable for sodium of 153, BUN of 42, leukocytosis to 11,100, and hemoglobin of 8.4. Lactic acid returns elevated at a value of 2.06. Urine is sent for analysis and notable for many bacteria, moderate leukocytes, and negative nitrite. Blood and urine cultures were obtained, empiric vancomycin and Zosyn was administered, and a 30 cc/kg NS bolus was given. Patient's heart rate improved down to the low 100s but blood pressure remained in the 90/60 range. Patient will be admitted to the stepdown unit  for ongoing evaluation and management of possible GI bleed and sepsis with source not yet identified.   Where does patient live?   SNF      Can patient participate in ADLs? Little         Review of Systems:  Unable to obtain ROS secondary to patient's clinical condition with non-verbal state.     Allergy:  Allergies  Allergen Reactions  . Amlodipine Swelling  . Latex Rash    Unknown   . Other Other (See Comments)    Natural Rubber- Unknown     History reviewed. No pertinent past medical history.  Past Surgical History  Procedure Laterality Date  . Abdominal hysterectomy    . Cholecystectomy    . Flexible sigmoidoscopy N/A 10/25/2015    Procedure: FLEXIBLE SIGMOIDOSCOPY;  Surgeon: Carman Ching, MD;  Location: [facility] ENDOSCOPY;  Service: Endoscopy;  Laterality: N/A;    Social History:  reports that she has quit smoking. Her smoking use included Cigarettes. She has never used smokeless tobacco. She reports that she does not drink alcohol or use illicit drugs.  Family History:  Family History  Problem Relation Age of Onset  . Heart disease Mother 68    MI     Prior to Admission medications   Medication Sig Start Date End Date Taking? Authorizing Provider  acetaminophen (TYLENOL) 325 MG tablet Take 2 tablets (650 mg total) by mouth daily. Patient taking differently: 650 mg by Gastric Tube route daily at 12 noon. 2 tablets given every day, may take additional tablets PRN for fever above 100 F 03/22/15  Yes Hollice Espy, MD  Amino Acids-Protein Hydrolys (FEEDING SUPPLEMENT, PRO-STAT SUGAR FREE 64,) LIQD Place 60 mLs into feeding tube 2 (two) times daily. 10/26/15  Yes Jeralyn Bennett, MD  antiseptic oral rinse (CPC / CETYLPYRIDINIUM CHLORIDE 0.05%) 0.05 % LIQD solution 7 mLs by Mouth Rinse route 2 (two) times daily. 12/08/15  Yes Alison Murray, MD  Ascorbic Acid (VITAMIN C) 1000 MG tablet Give 1,000 mg by tube daily.    Yes Historical Provider, MD  carboxymethylcellulose (REFRESH  PLUS) 0.5 % SOLN Place 2 drops into both eyes 3 (three) times daily.   Yes Historical Provider, MD  Cholecalciferol (VITAMIN D3) 5000 UNITS TABS Take 5,000 Units by mouth daily with breakfast.    Yes Historical Provider, MD  cholestyramine light (PREVALITE) 4 G packet Take 4 g by mouth 3 (three) times daily. mix in 8 oz. Of Liquid   Yes Historical Provider, MD  famotidine (PEPCID) 40 MG/5ML suspension Take 2.5 mLs (20 mg total) by mouth every 12 (twelve) hours. 03/22/15  Yes Hollice Espy, MD  ferrous sulfate 300 (60 FE) MG/5ML syrup Take 5 mLs (300 mg total) by mouth 2 (two) times daily with a meal. Patient taking differently: Place 300 mg into feeding tube 2 (two) times daily with a meal.  09/06/15  Yes Kathlen Mody, MD  Lacosamide (VIMPAT) 100 MG TABS 100 mg by Gastric Tube route 2 (two) times daily.    Yes Historical Provider, MD  levETIRAcetam (KEPPRA) 100 MG/ML solution Take 15 mLs (1,500 mg total) by mouth 2 (two) times daily. Patient taking differently: Place 1,500 mg into feeding tube 2 (two) times daily.  10/26/15  Yes Jeralyn Bennett, MD  lisinopril (PRINIVIL,ZESTRIL) 10 MG tablet Give 10 mg by tube daily.    Yes Historical Provider, MD  loperamide (IMODIUM A-D) 2 MG tablet Take 4 mg by mouth 4 (four) times daily as needed for diarrhea or loose stools.   Yes Historical Provider, MD  metoprolol (LOPRESSOR) 50 MG tablet Take 1 tablet (50 mg total) by mouth 2 (two) times daily. Patient taking differently: 50 mg by PEG Tube route 2 (two) times daily.  10/26/15  Yes Jeralyn Bennett, MD  Multiple Vitamin (MULTIVITAMIN WITH MINERALS) TABS tablet Give 1 tablet by tube daily with breakfast.    Yes Historical Provider, MD  ondansetron (ZOFRAN) 4 MG tablet Take 1 tablet (4 mg total) by mouth every 6 (six) hours as needed for nausea. 12/08/15  Yes Alison Murray, MD  polyethylene glycol [facility]) packet Take 17 g by mouth daily. Patient taking differently: Give 17 g by tube daily as needed for mild  constipation.  10/26/15  Yes Jeralyn Bennett, MD  Potassium Chloride 25 MEQ PACK Place 25 mEq into feeding tube daily. 12/08/15  Yes Alison Murray, MD  ranitidine (ZANTAC) 150 MG/10ML syrup Place 10 mLs (150 mg total) into feeding tube 2 (two) times daily. 12/08/15  Yes Alison Murray, MD  traZODone (DESYREL) 50 MG tablet Take 25 mg by mouth at bedtime as needed for sleep.   Yes Historical Provider, MD  Water For Irrigation, Sterile (FREE WATER) SOLN Take 200 mLs by mouth every 8 (eight) hours. 10/26/15  Yes Jeralyn Bennett, MD  Nutritional Supplements (FEEDING SUPPLEMENT, JEVITY 1.5 CAL/FIBER,) LIQD Place 1,000 mLs into feeding tube continuous. Patient not taking: Reported on 01/03/2016 10/26/15   Jeralyn Bennett, MD    Physical Exam: Filed Vitals:   01/03/16 2030 01/03/16 2045 01/03/16 2115 01/03/16 2130  BP: 90/62 91/64 91/62  92/60  Pulse:  95  Temp:      TempSrc:      Resp: 24 42 39 45  Height:      Weight:      SpO2:    100%   General: Not in acute distress HEENT:       Eyes: PERRL, EOMI, no scleral icterus or conjunctival pallor.       ENT: No discharge from the ears or nose, no pharyngeal ulcers, petechiae or exudate, no tonsillar enlargement.        Neck: No JVD, no bruit, no appreciable mass. Old trach scar well-healed Heme: No cervical adenopathy, no pallor Cardiac: Rate ~120 and regular, No murmurs, No gallops or rubs. Pulm: Good air movement bilaterally. No rales, wheezing, rhonchi or rubs. Abd: Soft, nondistended, nontender, no rebound pain or gaurding, BS present. Ext: No LE edema bilaterally. 2+DP/PT pulse bilaterally. Musculoskeletal: Flexion contractures, no red, hot, swollen joints   Skin: No rashes on exposed surfaces. Sacral wound dressed with minimal surrounding erythema, slight odor  Neuro: Alert, non-verbal, makes eye contact, PERRL, no facial asymmetry, tone symmetric throughout, Babinski sign down-going b/l.  Psych: Patient is not overtly psychotic, though  difficult to assess given her non-verbal state.  Labs on Admission:  Basic Metabolic Panel:  Recent Labs Lab 01/03/16 1936  NA 153*  K 4.8  CL 118*  CO2 22  GLUCOSE 97  BUN 42*  CREATININE 0.74  CALCIUM 9.5   Liver Function Tests:  Recent Labs Lab 01/03/16 1936  AST 17  ALT 19  ALKPHOS 67  BILITOT 0.3  PROT 7.0  ALBUMIN 2.2*   No results for input(s): LIPASE, AMYLASE in the last 168 hours. No results for input(s): AMMONIA in the last 168 hours. CBC:  Recent Labs Lab 01/03/16 1936  WBC 11.1*  NEUTROABS 7.8*  HGB 8.4*  HCT 27.6*  MCV 92.9  PLT 481*   Cardiac Enzymes: No results for input(s): CKTOTAL, CKMB, CKMBINDEX, TROPONINI in the last 168 hours.  BNP (last 3 results)  Recent Labs  03/18/15 1240 08/29/15 1305  BNP 69.7 47.2    ProBNP (last 3 results) No results for input(s): PROBNP in the last 8760 hours.  CBG: No results for input(s): GLUCAP in the last 168 hours.  Radiological Exams on Admission: Dg Chest Port 1 View  01/03/2016  CLINICAL DATA:  50 year old female with sepsis and tachypnea EXAM: PORTABLE CHEST 1 VIEW COMPARISON:  Radiograph dated 12/04/2015 FINDINGS: The right-sided PICC is retracted and the tip now lies in the proximal right upper extremity likely in the axillary or brachial vein. Recommend evaluation and adjustment. Single-view of the chest does not demonstrate a focal consolidation. The lungs are hypovolemic. There is no pleural effusion or pneumothorax. Stable cardiac silhouette. No acute osseous pathology. IMPRESSION: No acute cardiopulmonary process. Retracted right-sided PICC with tip in the proximal right arm. Recommend re-evaluation and repositioning. Electronically Signed   By: Elgie Collard M.D.   On: 01/03/2016 18:48    EKG:  Not done in ED, will obtain as appropriate   Assessment/Plan  1. Sepsis, source not yet identified  - Meets criteria for sepsis on admission with tachycardia, tachypnea, leukocytosis  - UA  is equivocal and may represent the source; CXR with clear lungs; abd exam benign, no vomiting or diarrhea reported; sacral wound with minimal surrounding erythema - Given recent cultures growing ESBL, will cover with empiric carbapenem (meropenem chosen given seizure hx), and vancomycin while awaiting culture data  - Urine and blood cultures obtained  prior to abx and incubating  - Initial lactate 2.06 prior to the 30 cc/kg bolus, will trend  - Check procalcitonin  - Continue IVF with NS at 125 cc/kg, adjust according to serial chem panels as below under "Hypernatremia"    2. GI bleed - SNF personnel describe 10-15 mL dark red blood per rectum today; FOBT positive in ED  - Has hx of lower GIB with flex sig on 10/25/15 demonstrating rectosigmoid colon ulcers that were attributed to ischemia from constipation  - Initial Hgb is 8.4, consistent with patient's apparent baseline  - Type and screen has been completed  - Holding pharmacologic VTE ppx for now  - Elevated BUN/SCr ratio suggests upper GI source  - Protonix 40 mg IV q12h for now - Check H/H overnight for stability   3. Sacral decubitus ulcer  - Stage IV, previously managed with wound vac  - Presents with wound vac dressings in place, but no wound vac  - Minimal surrounding erythema and slight odor noted  - Wound care consultation requested   4. Normocytic anemia  - Likely secondary to chronic disease  - Initial Hgb 8.4, consistent with her apparent baseline  - Will check H/H again overnight given reported GI bleed  - Holding pharmacologic VTE ppx  - Type and screen completed  - Monitoring    5. Essential hypertension - BP has been low so far despite 30 cc/kg bolus  - Hold home lisinopril and metoprolol for now  - Monitor in step-down given concern for progression to shock  - If pressors are required, PICC in Rt arm is available  - Continue IVF with NS, may need to adjust based on electrolytes, monitoring  - Cautious  resumption of home BP meds once stable    6. Seizure disorder  - Reportedly stable on current dose Vimpat and Keppra, will continue  - No suggestion of active seizure activity  - Monitor    7. Hypernatremia  - Presents with serum sodium of 153 in setting of dehydration  - Received 30 cc/kg NS bolus in ED and then continued on NS at 125 cc/hr  - Will repeat BMP now and q6h overnight, adjust fluids as indicated  - If persists, can increase volume of free-water PEG flushes  - Monitor    DVT ppx:  SCDs  Code Status: Full code Family Communication: None at bed side.                Disposition Plan: Admit to inpatient   Date of Service 01/03/2016    Briscoe Deutscher, MD [facility] Pager [phone]  If 7PM-7AM, please contact night-coverage [email or web address] Password [email or web address] 01/03/2016, 9:48 PM

## 2016-01-03 NOTE — Progress Notes (Signed)
Pharmacy Antibiotic Note  [name] is a 50 y.o. female admitted on 01/03/2016 with sepsis.  Pharmacy has been consulted for Zosyn and vancomycin dosing. Afebrile, WBC elevated at 11.1. SCr 0.74 although baseline seems to be ~0.4. Will dose a little more conservatively due to bump in baseline SCr. First time doses ordered per MD and given. Has PCN allergy listed with swelling as reaction. MD felt allergy was not a true allergy. Talked with RN and MD, no reaction noted after 3 hrs. Now to switch to meropenem for possibly ESBL infection. Will take PCN off allergy list.  Plan: Start meropenem 1g IV Q8 Give vancomycin 1.5g IV x 1, then start vancomycin 1g IV Q12 Monitor clinical picture, renal function, VT prn F/U C&S, abx deescalation / LOT   Height:  (162.6 cm) Weight: 189 lb (85.73 kg) IBW/kg (Calculated) : 54.7  Temp (24hrs), Avg:99.3 F (37.4 C), Min:99.3 F (37.4 C), Max:99.3 F (37.4 C)  No results for input(s): WBC, CREATININE, LATICACIDVEN, VANCOTROUGH, VANCOPEAK, VANCORANDOM, GENTTROUGH, GENTPEAK, GENTRANDOM, TOBRATROUGH, TOBRAPEAK, TOBRARND, AMIKACINPEAK, AMIKACINTROU, AMIKACIN in the last 168 hours.  CrCl cannot be calculated (Patient has no serum creatinine result on file.).    Allergies  Allergen Reactions  . Amlodipine Swelling  . Penicillins Swelling    - Tolerates cefepime, cefuroxime, Keflex, Ceftaz Has patient had a PCN reaction causing immediate rash, facial/tongue/throat swelling, SOB or lightheadedness with hypotension: Yes Has patient had a PCN reaction causing severe rash involving mucus membranes or skin necrosis: No Has patient had a PCN reaction that required hospitalization No Has patient had a PCN reaction occurring within the last 10 years: No If all of the above answers are "NO", then may proceed with Cephalosporin use.  . Latex Rash    Unknown   . Other Other (See Comments)    Natural Rubber- Unknown     Antimicrobials this  admission: Zosyn 2/28 >> 2/28 Meropenem 2/28 >> Vancomycin 2/28 >>   Dose adjustments this admission: n/a  Microbiology results: 2/28 BCx:  2/28 UCx:    Thank you for allowing pharmacy to be a part of this patient's care.  Enzo Bi, PharmD, BCPS Clinical Pharmacist Pager [phone] 01/03/2016 6:18 PM

## 2016-01-03 NOTE — ED Notes (Signed)
Pt arrived via [facility], from [facility]. Pt nonverbal at baseline per EMS report. Per report, nurse aide found 10-15 ml's of dark red blood from rectum. Sacral ulcer present unattached from wound vac. HR in 120's, afebrile. Pt has PICC in R Arm, and feeding tube.

## 2016-01-03 NOTE — ED Provider Notes (Signed)
The patient is a chronically ill 50 year old female who comes from a nursing facility with reported large amount of bright red blood per rectum. The patient is nonverbal at baseline, level V caveat apply secondary to her inability to communicate. Paramedics reported that the patient was hypotensive and tachycardic, she was afebrile, she does have a PICC line in her right arm. There is a feeding tube in her epigastrium and a vacuum device over her sacral decubitus. The patient is unable to give any information, she does appear to have mild tenderness in her abdomen, her lungs are clear, she is not in respiratory distress, here no rales or wheezing. She has tachycardia to 120 bpm with good pulses. Her blood pressure is 95 systolic. She refuses to open her mouth, her conjunctiva are clear. The patient does appear critically ill, she could be septic, this could be related to a GI bleed and anemia, she will need a broad workup and likely admission to a high level of care the hospital. Critical care is being provided.  CRITICAL CARE Performed by: Vida Roller Total critical care time: 35 minutes Critical care time was exclusive of separately billable procedures and treating other patients. Critical care was necessary to treat or prevent imminent or life-threatening deterioration. Critical care was time spent personally by me on the following activities: development of treatment plan with patient and/or surrogate as well as nursing, discussions with consultants, evaluation of patient's response to treatment, examination of patient, obtaining history from patient or surrogate, ordering and performing treatments and interventions, ordering and review of laboratory studies, ordering and review of radiographic studies, pulse oximetry and re-evaluation of patient's condition.  I saw and evaluated the patient, reviewed the resident's note and I agree with the findings and plan.  ED ECG REPORT  I personally  interpreted this EKG   Date: 01/05/2016   Rate: 111  Rhythm: sinus tachycardia  QRS Axis: normal  Intervals: normal  ST/T Wave abnormalities: normal  Conduction Disutrbances:none  Narrative Interpretation:   Old EKG Reviewed: none available   I personally interpreted the EKG as well as the resident and agree with the interpretation on the resident's chart.  Final diagnoses:  SIRS (systemic inflammatory response syndrome) (HCC)  Rectal bleeding      Eber Hong, MD 01/05/16 [phone]

## 2016-01-03 NOTE — ED Notes (Signed)
Verified amount of NS boluses (with hypernatremia) with Dr.Irick; pt to receive full 30kg/ml dose

## 2016-01-04 DIAGNOSIS — R569 Unspecified convulsions: Secondary | ICD-10-CM

## 2016-01-04 DIAGNOSIS — K921 Melena: Secondary | ICD-10-CM

## 2016-01-04 LAB — CBC WITH DIFFERENTIAL/PLATELET
BASOS PCT: 0 %
Basophils Absolute: 0 10*3/uL (ref 0.0–0.1)
EOS PCT: 1 %
Eosinophils Absolute: 0.1 10*3/uL (ref 0.0–0.7)
HEMATOCRIT: 25.9 % — AB (ref 36.0–46.0)
Hemoglobin: 7.7 g/dL — ABNORMAL LOW (ref 12.0–15.0)
LYMPHS ABS: 1.7 10*3/uL (ref 0.7–4.0)
Lymphocytes Relative: 15 %
MCH: 27.4 pg (ref 26.0–34.0)
MCHC: 29.7 g/dL — ABNORMAL LOW (ref 30.0–36.0)
MCV: 92.2 fL (ref 78.0–100.0)
MONO ABS: 0.6 10*3/uL (ref 0.1–1.0)
MONOS PCT: 5 %
Neutro Abs: 9.2 10*3/uL — ABNORMAL HIGH (ref 1.7–7.7)
Neutrophils Relative %: 79 %
PLATELETS: 430 10*3/uL — AB (ref 150–400)
RBC: 2.81 MIL/uL — AB (ref 3.87–5.11)
RDW: 17.7 % — ABNORMAL HIGH (ref 11.5–15.5)
WBC: 11.6 10*3/uL — ABNORMAL HIGH (ref 4.0–10.5)

## 2016-01-04 LAB — HEMATOCRIT: HCT: 26.1 % — ABNORMAL LOW (ref 36.0–46.0)

## 2016-01-04 LAB — BASIC METABOLIC PANEL
Anion gap: 11 (ref 5–15)
Anion gap: 8 (ref 5–15)
BUN: 31 mg/dL — AB (ref 6–20)
BUN: 27 mg/dL — AB (ref 6–20)
CALCIUM: 8.8 mg/dL — AB (ref 8.9–10.3)
CALCIUM: 8.7 mg/dL — AB (ref 8.9–10.3)
CO2: 21 mmol/L — AB (ref 22–32)
CO2: 18 mmol/L — AB (ref 22–32)
CREATININE: 0.66 mg/dL (ref 0.44–1.00)
CREATININE: 0.61 mg/dL (ref 0.44–1.00)
Chloride: 120 mmol/L — ABNORMAL HIGH (ref 101–111)
Chloride: 126 mmol/L — ABNORMAL HIGH (ref 101–111)
GFR calc Af Amer: >60 mL/min (ref >60)
GFR calc Af Amer: >60 mL/min (ref >60)
GFR calc non Af Amer: >60 mL/min (ref >60)
GFR calc non Af Amer: >60 mL/min (ref >60)
GLUCOSE: 98 mg/dL (ref 65–99)
GLUCOSE: 76 mg/dL (ref 65–99)
Potassium: 4.1 mmol/L (ref 3.5–5.1)
Potassium: 4.7 mmol/L (ref 3.5–5.1)
Sodium: 152 mmol/L — ABNORMAL HIGH (ref 135–145)
Sodium: 152 mmol/L — ABNORMAL HIGH (ref 135–145)

## 2016-01-04 LAB — PROTIME-INR
INR: 1.24 (ref 0.00–1.49)
PROTHROMBIN TIME: 15.7 s — AB (ref 11.6–15.2)

## 2016-01-04 LAB — APTT: APTT: 28 s (ref 24–37)

## 2016-01-04 LAB — HEMOGLOBIN: HEMOGLOBIN: 7.8 g/dL — AB (ref 12.0–15.0)

## 2016-01-04 LAB — GLUCOSE, CAPILLARY: Glucose-Capillary: 77 mg/dL (ref 65–99)

## 2016-01-04 LAB — LACTIC ACID, PLASMA: Lactic Acid, Venous: 1.5 mmol/L (ref 0.5–2.0)

## 2016-01-04 LAB — PROCALCITONIN: Procalcitonin: 0.23 ng/mL

## 2016-01-04 LAB — MRSA PCR SCREENING: MRSA by PCR: NEGATIVE

## 2016-01-04 MED ORDER — LACOSAMIDE 50 MG PO TABS
100.0000 mg | ORAL_TABLET | Freq: Two times a day (BID) | ORAL | Status: DC
  Administered 2016-01-04 – 2016-01-11 (×16): 100 mg
  Filled 2016-01-04 (×16): qty 2

## 2016-01-04 MED ORDER — ONDANSETRON HCL 4 MG PO TABS: 4.0000 mg | ORAL_TABLET | Freq: Four times a day (QID) | ORAL | Status: DC | PRN

## 2016-01-04 MED ORDER — ACETAMINOPHEN 325 MG PO TABS
650.0000 mg | ORAL_TABLET | Freq: Four times a day (QID) | ORAL | Status: DC | PRN
  Administered 2016-01-05 – 2016-01-11 (×5): 650 mg
  Filled 2016-01-04 (×5): qty 2

## 2016-01-04 MED ORDER — POLYETHYLENE GLYCOL 3350 17 G PO PACK
17.0000 g | PACK | Freq: Every day | ORAL | Status: DC
  Administered 2016-01-06 – 2016-01-08 (×3): 17 g
  Filled 2016-01-04 (×4): qty 1

## 2016-01-04 MED ORDER — SODIUM CHLORIDE 0.9 % IV SOLN: INTRAVENOUS | Status: DC

## 2016-01-04 MED ORDER — SODIUM CHLORIDE 0.45 % IV SOLN
INTRAVENOUS | Status: DC
  Administered 2016-01-04 – 2016-01-05 (×2): via INTRAVENOUS

## 2016-01-04 MED ORDER — ONDANSETRON HCL 4 MG/2ML IJ SOLN: 4.0000 mg | Freq: Four times a day (QID) | INTRAMUSCULAR | Status: DC | PRN

## 2016-01-04 MED ORDER — JEVITY 1.5 CAL/FIBER PO LIQD
1000.0000 mL | ORAL | Status: DC
  Administered 2016-01-05 – 2016-01-06 (×3): 1000 mL
  Filled 2016-01-04 (×3): qty 1000

## 2016-01-04 MED ORDER — CHLORHEXIDINE GLUCONATE 0.12 % MT SOLN
15.0000 mL | Freq: Two times a day (BID) | OROMUCOSAL | Status: DC
  Administered 2016-01-04 – 2016-01-11 (×14): 15 mL via OROMUCOSAL
  Filled 2016-01-04 (×14): qty 15

## 2016-01-04 MED ORDER — ACETAMINOPHEN 650 MG RE SUPP
650.0000 mg | Freq: Four times a day (QID) | RECTAL | Status: DC | PRN
  Filled 2016-01-04: qty 1

## 2016-01-04 MED ORDER — SODIUM CHLORIDE 0.9% FLUSH: 10.0000 mL | INTRAVENOUS | Status: DC | PRN

## 2016-01-04 MED ORDER — FREE WATER
200.0000 mL | Freq: Four times a day (QID) | Status: DC
Start: 2016-01-04 — End: 2016-01-07
  Administered 2016-01-04 – 2016-01-07 (×12): 200 mL via ORAL

## 2016-01-04 MED ORDER — SODIUM CHLORIDE 0.9% FLUSH
10.0000 mL | Freq: Two times a day (BID) | INTRAVENOUS | Status: DC
  Administered 2016-01-04 – 2016-01-06 (×4): 10 mL
  Administered 2016-01-06: 20 mL
  Administered 2016-01-07 – 2016-01-08 (×2): 10 mL

## 2016-01-04 MED ORDER — PRO-STAT SUGAR FREE PO LIQD: 60.0000 mL | Freq: Two times a day (BID) | ORAL | Status: DC

## 2016-01-04 MED ORDER — TRAZODONE HCL 50 MG PO TABS: 25.0000 mg | ORAL_TABLET | Freq: Every evening | ORAL | Status: DC | PRN

## 2016-01-04 MED ORDER — SODIUM CHLORIDE 0.9 % IV BOLUS (SEPSIS)
1000.0000 mL | Freq: Once | INTRAVENOUS | Status: AC
  Administered 2016-01-04: 1000 mL via INTRAVENOUS

## 2016-01-04 MED ORDER — CETYLPYRIDINIUM CHLORIDE 0.05 % MT LIQD
7.0000 mL | Freq: Two times a day (BID) | OROMUCOSAL | Status: DC
  Administered 2016-01-05 – 2016-01-10 (×12): 7 mL via OROMUCOSAL

## 2016-01-04 NOTE — Progress Notes (Signed)
Notified IV team for clarification of Right PICC if it may be used due to location on CXR report and may use with no blood return for labs and no Vancomycin.

## 2016-01-04 NOTE — Progress Notes (Signed)
Notified Dr. Sharon Seller of need for foley to keep wound vac on sacrum. Awaiting orders. Will continue to monitor pt.

## 2016-01-04 NOTE — Progress Notes (Signed)
Initial Nutrition Assessment  DOCUMENTATION CODES:   Obesity unspecified  INTERVENTION:   - Continue 60 ml Prostat liquid protein BID via PEG tube, providing an additional 400 kcal and 60 grams of protein. - Recommend initiating TF with Jevity 1.5 at 25 ml/hr via PEG tube to increase by 10 ml every 4 hours to a goal rate of 55 ml/hr.   Tube feeding and Prostat regimen will provide a total of 2380 kcal, 144 grams protein, and 1003 ml free water.  NUTRITION DIAGNOSIS:   Increased nutrient needs related to wound healing as evidenced by estimated needs.  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 Pt is chronically ill and was admitted to [facility] from 1/28 - 2/2 with sepsis secondary to UTI with ESBL-producing Klebsiella bacteremia. There was also suspicion for bacterial infection of a stage IV decubitus sacral ulcer at that time.  Patient is nonverbal at baseline and unable to contribute to history.   Patient not currently receiving tube feedings per RN.  Nutrition Focused Physical Exam was conducted.  Findings include no fat depletion, no muscle depletion, and no edema.  Unable to obtain weight history d/t nonverbal pt.  Per chart review, weight appears to be stable.  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  Pt requires elevated needs for wound healing.  Recommend initiating tube feeding with Jevity 1.5 at 25 ml/hr via PEG tube to increase by 10 ml every 4 hours to a goal rate of 55 ml/hr.  Continue providing patient with 60 mL Prostat liquid protein BID to help  meet elevated protein needs.   Medications reviewed and include: vitamin D, ferrous sulfate, MVI.  Labs reviewed and include: sodium elevated (152).  Diet Order:  Diet NPO time specified  Skin:  Wound (see comment) (Stage 4 PU on Sacrum)  Last BM:  3/1  Height:   Ht Readings from Last 1 Encounters:  01/03/16  (1.626 m)    Weight:   Wt Readings from Last 1 Encounters:  01/03/16 189 lb (85.73 kg)    Ideal Body Weight:  54.5 kg  BMI:  Body mass index is 32.43 kg/(m^2).  Estimated Nutritional Needs:   Kcal:  2200-2400  Protein:  120-130 grams  Fluid:  >/= 2L  EDUCATION NEEDS:   No education needs identified at this time  Doroteo Glassman, Dietetic Intern Pager: [phone]

## 2016-01-04 NOTE — Consult Note (Addendum)
WOC wound consult note Reason for Consult: Pt has been using a negative pressure device to the sacrum wound prior to admission.  Dressing is intact, but no suction machine has been applied at this time. There is no machine from the patient's facility in the room. 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 Dressing procedure/placement/frequency: Please order a foley to maintain seal on sacrum Vac dressing.  Pt has been frequently incontinent of urine and Vac dressing will not be able to maintain integrity or suction if it is soiled and moist.  Applied barrier ring around wound edge to attempt to maintain seal.  One piece black foam to cont suction. Another piece bridged to hip to reduce pressure from the track pad. Pt tolerated without discomfort.  Plan for dressing change Q M/W/F. No family members at bedside to discuss plan of care. Cammie Mcgee MSN, RN, CWOCN, [place], CNS [phone]

## 2016-01-04 NOTE — Progress Notes (Signed)
[facility] TEAM 1 - Stepdown/ICU TEAM PROGRESS NOTE  [name] ZOX:[id number] DOB: 1966-06-25 DOA: 01/03/2016 PCP: Ruthe Mannan, MD  Admit HPI / Brief Narrative: 50 y.o. 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 Patient is chronically ill and was admitted to this institution from 12/03/2015 - 12/08/2015 with sepsis secondary to UTI with ESBL-producing Klebsiella bacteremia. There was also suspicion for bacterial infection of a stage IV decubitus sacral ulcer at that time. Patient was ultimately discharged back to her SNF. The day of her admit a SNF worker noted 10-15 mL dark red blood per rectum and EMS was activated for transport to the hospital. There were no other concerns or notable changes identified by the SNF personnel. Patient is nonverbal at baseline and was unable to contribute to history.  In ED, patient was found to be afebrile, saturating well on room air, with tachycardia to the 120s and blood pressure in the 80s systolic. Chest x-ray was obtained and negative for acute cardiopulmonary disease. Initial blood work was notable for sodium of 153, BUN of 42, leukocytosis to 11,100, and hemoglobin of 8.4. Lactic acid was elevated at 2.06. Urine was notable for many bacteria, moderate leukocytes, and negative nitrite. Blood and urine cultures were obtained, empiric vancomycin and Zosyn was administered, and a 30 cc/kg NS bolus was given. Patient's heart rate improved down to the low 100s but blood pressure remained in the 90/60 range.   HPI/Subjective: The pt opens her eyes during my exam, but she is nonverbal (at baseline).  She does not appear to be in acute distress or pain.    Assessment/Plan:  Sepsis, source not yet identified  - UA is equivocal and may represent the source - CXR clear  lungs - Given recent cultures growing ESBL covering with empiric carbapenem (meropenem chosen given seizure hx), and vancomycin   Gram negative rod UTI -f/u speciation and sensitivities   Gram+ cocci in 1 of 2 blood cx Likely contaminant - cont Vanc for now   GI bleed - SNF personnel describe 10-15 mL dark red blood per rectum - FOBT positive in ED  - hx of lower GIB with flex sig on 10/25/15 demonstrating rectosigmoid colon ulcers that were attributed to ischemia from constipation  - baseline Hgb ~8.4 - Hgb has decreased w/ volume resuscitation, but not yet to transfusion threshold - follow trend - check anemia panel   Normocytic anemia  - Likely secondary to chronic disease +/- element of acute blood loss - follow   Sacral decubitus ulcer  - Stage IV, previously managed with wound vac  - Presents with wound vac dressings in place, but no wound vac  - WOC evaluated and is addressing care   Essential hypertension - not an active issue presently - follow BP trend   Seizure disorder  - Reportedly stable on current dose Vimpat and Keppra, will continue  - No suggestion of active seizure activity   Hypernatremia  - appears most c/w hypovolemic cause - cont volume expansion - add free water via PEG - follow   Code Status: FULL Family Communication: no family present at time of exam Disposition Plan: SDU  Consultants: none  Procedures: none  Antibiotics: Meropenem 2/28 > Vanc 2/28 >  DVT prophylaxis: SCDs only w/ ?GIB  Objective: Blood pressure 127/76, pulse 108, temperature 99.9  F (37.7 C), temperature source Axillary, resp. rate 38, height  (1.626 m), weight 85.73 kg (189 lb), SpO2 100 %.  Intake/Output Summary (Last 24 hours) at 01/04/16 1641 Last data filed at 01/04/16 1140  Gross per 24 hour  Intake   2170 ml  Output      0 ml  Net   2170 ml   Exam: General: No acute respiratory distress Lungs: Clear to auscultation bilaterally without  wheezes or crackles Cardiovascular: tachycardic - regular - no appreciable M Abdomen: Nondistended, soft, bowel sounds positive, PEG insertion clean/dry  Extremities: No significant cyanosis, clubbing, or edema bilateral lower extremities  Data Reviewed:  Basic Metabolic Panel:  Recent Labs Lab 01/03/16 1936 01/03/16 2241 01/04/16 0432 01/04/16 0929  NA 153* 152* 152* 152*  K 4.8 4.1 4.1 4.7  CL 118* 120* 120* 126*  CO2 22 22 21* 18*  GLUCOSE 97 95 98 76  BUN 42* 36* 31* 27*  CREATININE 0.74 0.72 0.66 0.61  CALCIUM 9.5 8.8* 8.8* 8.7*    CBC:  Recent Labs Lab 01/03/16 1936 01/04/16 0107 01/04/16 0432  WBC 11.1*  --  11.6*  NEUTROABS 7.8*  --  9.2*  HGB 8.4* 7.8* 7.7*  HCT 27.6* 26.1* 25.9*  MCV 92.9  --  92.2  PLT 481*  --  430*    Liver Function Tests:  Recent Labs Lab 01/03/16 1936  AST 17  ALT 19  ALKPHOS 67  BILITOT 0.3  PROT 7.0  ALBUMIN 2.2*    Coags:  Recent Labs Lab 01/04/16 0432  INR 1.24    Recent Labs Lab 01/04/16 0432  APTT 28    CBG:  Recent Labs Lab 01/04/16 0729  GLUCAP 77    Recent Results (from the past 240 hour(s))  Urine culture     Status: None (Preliminary result)   Collection Time: 01/03/16  6:22 PM  Result Value Ref Range Status   Specimen Description URINE, CATHETERIZED  Final   Special Requests NONE  Final   Culture >=100,000 COLONIES/mL GRAM NEGATIVE RODS  Final   Report Status PENDING  Incomplete  Blood Culture (routine x 2)     Status: None (Preliminary result)   Collection Time: 01/03/16  7:00 PM  Result Value Ref Range Status   Specimen Description BLOOD RIGHT HAND  Final   Special Requests BOTTLES DRAWN AEROBIC ONLY 6CC  Final   Culture  Setup Time   Final    GRAM POSITIVE COCCI IN CLUSTERS AEROBIC BOTTLE ONLY CRITICAL RESULT CALLED TO, READ BACK BY AND VERIFIED WITH: Cindee Lame RN 13:45 01/04/16 ([email or web address])    Culture NO GROWTH < 24 HOURS  Final   Report Status PENDING  Incomplete  Blood Culture  (routine x 2)     Status: None (Preliminary result)   Collection Time: 01/03/16  7:05 PM  Result Value Ref Range Status   Specimen Description BLOOD RIGHT ANTECUBITAL  Final   Special Requests   Final    BOTTLES DRAWN AEROBIC AND ANAEROBIC BLUE 10CC RED 5CC   Culture NO GROWTH < 24 HOURS  Final   Report Status PENDING  Incomplete  MRSA PCR Screening     Status: None   Collection Time: 01/04/16  2:45 AM  Result Value Ref Range Status   MRSA by PCR NEGATIVE NEGATIVE Final    Comment:        The GeneXpert MRSA Assay (FDA approved for NASAL specimens only), is one component of a comprehensive MRSA colonization surveillance  program. It is not intended to diagnose MRSA infection nor to guide or monitor treatment for MRSA infections.      Studies:   Recent x-ray studies have been reviewed in detail by the Attending Physician  Scheduled Meds:  Scheduled Meds: . cholecalciferol  5,000 Units Oral Q breakfast  . cholestyramine light  4 g Oral TID  . docusate sodium  100 mg Oral BID  . feeding supplement (PRO-STAT SUGAR FREE 64)  60 mL Per Tube BID  . ferrous sulfate  300 mg Per Tube BID WC  . free water  200 mL Oral 3 times per day  . lacosamide  100 mg Per Tube BID  . levETIRAcetam  1,500 mg Per Tube BID  . meropenem (MERREM) IV  1 g Intravenous 3 times per day  . multivitamin with minerals  1 tablet Per Tube Q breakfast  . pantoprazole (PROTONIX) IV  40 mg Intravenous Q12H  . polyethylene glycol  17 g Oral Daily  . polyvinyl alcohol  2 drop Both Eyes TID  . sodium chloride flush  3 mL Intravenous Q12H  . vancomycin  750 mg Intravenous Q12H    Time spent on care of this patient: 35 mins   Wash Nienhaus T , MD   [facility] Office  [phone] Pager - Text Page per Loretha Stapler as per below:  On-Call/Text Page:      [email or web address]      password [email or web address]  If 7PM-7AM, please contact night-coverage [email or web address] Password [email or web address] 01/04/2016, 4:41 PM   LOS: 1 day

## 2016-01-04 NOTE — Progress Notes (Signed)
PA notified about elevated HR 120's more IVF ordered I will continue to monitor.

## 2016-01-05 DIAGNOSIS — L89159 Pressure ulcer of sacral region, unspecified stage: Secondary | ICD-10-CM | POA: Diagnosis present

## 2016-01-05 DIAGNOSIS — N390 Urinary tract infection, site not specified: Principal | ICD-10-CM

## 2016-01-05 DIAGNOSIS — K2901 Acute gastritis with bleeding: Secondary | ICD-10-CM | POA: Diagnosis present

## 2016-01-05 DIAGNOSIS — I10 Essential (primary) hypertension: Secondary | ICD-10-CM | POA: Diagnosis present

## 2016-01-05 DIAGNOSIS — E870 Hyperosmolality and hypernatremia: Secondary | ICD-10-CM

## 2016-01-05 DIAGNOSIS — G40909 Epilepsy, unspecified, not intractable, without status epilepticus: Secondary | ICD-10-CM | POA: Diagnosis present

## 2016-01-05 DIAGNOSIS — A419 Sepsis, unspecified organism: Secondary | ICD-10-CM

## 2016-01-05 DIAGNOSIS — L89150 Pressure ulcer of sacral region, unstageable: Secondary | ICD-10-CM

## 2016-01-05 LAB — URINE CULTURE

## 2016-01-05 LAB — GLUCOSE, CAPILLARY
GLUCOSE-CAPILLARY: 71 mg/dL (ref 65–99)
GLUCOSE-CAPILLARY: 102 mg/dL — AB (ref 65–99)
Glucose-Capillary: 96 mg/dL (ref 65–99)
Glucose-Capillary: 84 mg/dL (ref 65–99)
Glucose-Capillary: 102 mg/dL — ABNORMAL HIGH (ref 65–99)
Glucose-Capillary: 105 mg/dL — ABNORMAL HIGH (ref 65–99)

## 2016-01-05 LAB — COMPREHENSIVE METABOLIC PANEL
ALT: 16 U/L (ref 14–54)
AST: 14 U/L — AB (ref 15–41)
Albumin: 1.8 g/dL — ABNORMAL LOW (ref 3.5–5.0)
Alkaline Phosphatase: 77 U/L (ref 38–126)
Anion gap: 7 (ref 5–15)
BILIRUBIN TOTAL: 0.4 mg/dL (ref 0.3–1.2)
BUN: 17 mg/dL (ref 6–20)
CO2: 20 mmol/L — ABNORMAL LOW (ref 22–32)
CREATININE: 0.56 mg/dL (ref 0.44–1.00)
Calcium: 8.7 mg/dL — ABNORMAL LOW (ref 8.9–10.3)
Chloride: 119 mmol/L — ABNORMAL HIGH (ref 101–111)
Glucose, Bld: 96 mg/dL (ref 65–99)
POTASSIUM: 3.6 mmol/L (ref 3.5–5.1)
Sodium: 146 mmol/L — ABNORMAL HIGH (ref 135–145)
TOTAL PROTEIN: 5.7 g/dL — AB (ref 6.5–8.1)

## 2016-01-05 LAB — RETICULOCYTES
RBC.: 2.54 MIL/uL — AB (ref 3.87–5.11)
RETIC CT PCT: 0.6 % (ref 0.4–3.1)
Retic Count, Absolute: 15.2 10*3/uL — ABNORMAL LOW (ref 19.0–186.0)

## 2016-01-05 LAB — FERRITIN: Ferritin: 468 ng/mL — ABNORMAL HIGH (ref 11–307)

## 2016-01-05 LAB — IRON AND TIBC
Iron: 15 ug/dL — ABNORMAL LOW (ref 28–170)
Saturation Ratios: 9 % — ABNORMAL LOW (ref 10.4–31.8)
TIBC: 176 ug/dL — AB (ref 250–450)
UIBC: 161 ug/dL

## 2016-01-05 LAB — FOLATE: Folate: 29.5 ng/mL (ref >5.9)

## 2016-01-05 LAB — VITAMIN B12: VITAMIN B 12: 750 pg/mL (ref 180–914)

## 2016-01-05 MED ORDER — CEFAZOLIN SODIUM-DEXTROSE 2-3 GM-% IV SOLR
2.0000 g | Freq: Three times a day (TID) | INTRAVENOUS | Status: DC
  Administered 2016-01-05 – 2016-01-06 (×4): 2 g via INTRAVENOUS
  Filled 2016-01-05 (×6): qty 50

## 2016-01-05 NOTE — Progress Notes (Signed)
[place] TEAM 1 - Stepdown/ICU TEAM Progress Note  [name] ZOX:[id number] DOB: [birth year] DOA: 01/03/2016 PCP: Ruthe Mannan, MD  Admit HPI / Brief Narrative: 50 y.o. 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 Patient is chronically ill and was admitted to this institution from 12/03/2015 - 12/08/2015 with sepsis secondary to UTI with ESBL-producing Klebsiella bacteremia. There was also suspicion for bacterial infection of a stage IV decubitus sacral ulcer at that time. Patient was ultimately discharged back to her SNF. The day of her admit a SNF worker noted 10-15 mL dark red blood per rectum and EMS was activated for transport to the hospital. There were no other concerns or notable changes identified by the SNF personnel. Patient is nonverbal at baseline and was unable to contribute to history.  In ED, patient was found to be afebrile, saturating well on room air, with tachycardia to the 120s and blood pressure in the 80s systolic. Chest x-ray was obtained and negative for acute cardiopulmonary disease. Initial blood work was notable for sodium of 153, BUN of 42, leukocytosis to 11,100, and hemoglobin of 8.4. Lactic acid was elevated at 2.06. Urine was notable for many bacteria, moderate leukocytes, and negative nitrite. Blood and urine cultures were obtained, empiric vancomycin and Zosyn was administered, and a 30 cc/kg NS bolus was given. Patient's heart rate improved down to the low 100s but blood pressure remained in the 90/60 range.   HPI/Subjective: Recent to eyes open alert follows no commands,  MAXIMUM TEMPERATURE 38.2 overnight  Assessment/Plan: Sepsis, UTI positive Proteus Mirabilis - Given recent cultures growing ESBL covering with empiric carbapenem (meropenem chosen given seizure hx),  and vancomycin   Gram+ cocci in 1 of 2 blood cx -Likely contaminant - cont Vanc for now   GI bleed - SNF personnel describe 10-15 mL dark red blood per rectum - FOBT positive in ED  - hx of lower GIB with flex sig on 10/25/15 demonstrating rectosigmoid colon ulcers that were attributed to ischemia from constipation  - baseline Hgb ~8.4 - Hgb has decreased w/ volume resuscitation, but not yet to transfusion threshold - follow trend - check anemia panel  -Transfuse for hemoglobin<7  Normocytic anemia  - Likely secondary to chronic disease +/- element of acute blood loss - follow   Sacral decubitus ulcer  - Stage IV, previously managed with wound vac  - Presents with wound vac dressings in place, but no wound vac  - WOC evaluated and is addressing care   Essential hypertension - not an active issue presently - follow BP trend   Seizure disorder  - Reportedly stable on current dose Vimpat and Keppra, will continue  - No suggestion of active seizure activity   Hypernatremia  - appears most c/w hypovolemic cause - cont volume expansion - add free water via PEG - follow     Code Status: FULL Family Communication: no family present at time of exam Disposition Plan: ??    Consultants: NA  Procedure/Significant Events:  NA  Culture 2/28 positive Proteus Mirabilis 2/28 blood right hand positive coag negative staph most likely contaminant 2/28 blood right AC NGTD 3/1 MRSA by PCR negative   Antibiotics: Meropenem 3/1>> 3/2 Zosyn 3/1>>3/2 Vancomycin 2/28>> 3/2 Cefazolin 3/2>>   DVT prophylaxis: SCD   Devices NA   LINES / TUBES:  NA    Continuous Infusions: . sodium chloride 125 mL/hr at 01/05/16 0126  . feeding supplement (JEVITY 1.5 CAL/FIBER) 1,000 mL (01/05/16 1800)    Objective: VITAL SIGNS: Temp: 99.3 F (37.4 C) (03/02 1917) Temp Source: Oral (03/02 1917) BP: 136/85 mmHg (03/02 1917) Pulse Rate: 129 (03/02  1917) SPO2; FIO2:   Intake/Output Summary (Last 24 hours) at 01/05/16 2233 Last data filed at 01/05/16 1921  Gross per 24 hour  Intake   2945 ml  Output   2125 ml  Net    820 ml     Exam: General: No acute respiratory distress Lungs: Clear to auscultation bilaterally without wheezes or crackles Cardiovascular: tachycardic - regular - no appreciable M Abdomen: Nondistended, soft, bowel sounds positive, PEG insertion clean/dry  Extremities: No significant cyanosis, clubbing, or edema bilateral lower extremities   Data Reviewed: Basic Metabolic Panel:  Recent Labs Lab 01/03/16 1936 01/03/16 2241 01/04/16 0432 01/04/16 0929 01/05/16 0430  NA 153* 152* 152* 152* 146*  K 4.8 4.1 4.1 4.7 3.6  CL 118* 120* 120* 126* 119*  CO2 22 22 21* 18* 20*  GLUCOSE 97 95 98 76 96  BUN 42* 36* 31* 27* 17  CREATININE 0.74 0.72 0.66 0.61 0.56  CALCIUM 9.5 8.8* 8.8* 8.7* 8.7*   Liver Function Tests:  Recent Labs Lab 01/03/16 1936 01/05/16 0430  AST 17 14*  ALT 19 16  ALKPHOS 67 77  BILITOT 0.3 0.4  PROT 7.0 5.7*  ALBUMIN 2.2* 1.8*   No results for input(s): LIPASE, AMYLASE in the last 168 hours. No results for input(s): AMMONIA in the last 168 hours. CBC:  Recent Labs Lab 01/03/16 1936 01/04/16 0107 01/04/16 0432  WBC 11.1*  --  11.6*  NEUTROABS 7.8*  --  9.2*  HGB 8.4* 7.8* 7.7*  HCT 27.6* 26.1* 25.9*  MCV 92.9  --  92.2  PLT 481*  --  430*   Cardiac Enzymes: No results for input(s): CKTOTAL, CKMB, CKMBINDEX, TROPONINI in the last 168 hours. BNP (last 3 results)  Recent Labs  03/18/15 1240 08/29/15 1305  BNP 69.7 47.2    ProBNP (last 3 results) No results for input(s): PROBNP in the last 8760 hours.  CBG:  Recent Labs Lab 01/05/16 0323 01/05/16 0716 01/05/16 1144 01/05/16 1629 01/05/16 1917  GLUCAP 84 71 102* 105* 102*    Recent Results (from the past 240 hour(s))  Urine culture     Status: None   Collection Time: 01/03/16  6:22 PM  Result  Value Ref Range Status   Specimen Description URINE, CATHETERIZED  Final   Special Requests NONE  Final   Culture >=100,000 COLONIES/mL PROTEUS MIRABILIS  Final   Report Status 01/05/2016 FINAL  Final   Organism ID, Bacteria PROTEUS MIRABILIS  Final      Susceptibility   Proteus mirabilis - MIC*    AMPICILLIN <=2 SENSITIVE Sensitive     CEFAZOLIN 8 SENSITIVE Sensitive     CEFTRIAXONE <=1 SENSITIVE Sensitive     CIPROFLOXACIN >=4 RESISTANT Resistant     GENTAMICIN <=1 SENSITIVE Sensitive     IMIPENEM 4 SENSITIVE Sensitive     NITROFURANTOIN 128 RESISTANT Resistant     TRIMETH/SULFA <=20 SENSITIVE Sensitive     AMPICILLIN/SULBACTAM <=2 SENSITIVE Sensitive     PIP/TAZO <=4 SENSITIVE Sensitive     * >=100,000 COLONIES/mL PROTEUS MIRABILIS  Blood Culture (routine x 2)     Status: None (Preliminary result)   Collection Time: 01/03/16  7:00 PM  Result Value Ref Range Status   Specimen Description BLOOD RIGHT HAND  Final   Special Requests BOTTLES DRAWN AEROBIC ONLY 6CC  Final   Culture  Setup Time   Final    GRAM POSITIVE COCCI IN CLUSTERS AEROBIC BOTTLE ONLY CRITICAL RESULT CALLED TO, READ BACK BY AND VERIFIED WITH: Cindee Lame RN 13:45 01/04/16 ([email or web address])    Culture   Final    STAPHYLOCOCCUS SPECIES (COAGULASE NEGATIVE) THE SIGNIFICANCE OF ISOLATING THIS ORGANISM FROM A SINGLE SET OF BLOOD CULTURES WHEN MULTIPLE SETS ARE DRAWN IS UNCERTAIN. PLEASE NOTIFY THE MICROBIOLOGY DEPARTMENT WITHIN ONE WEEK IF SPECIATION AND SENSITIVITIES ARE REQUIRED.    Report Status PENDING  Incomplete  Blood Culture (routine x 2)     Status: None (Preliminary result)   Collection Time: 01/03/16  7:05 PM  Result Value Ref Range Status   Specimen Description BLOOD RIGHT ANTECUBITAL  Final   Special Requests   Final    BOTTLES DRAWN AEROBIC AND ANAEROBIC BLUE 10CC RED 5CC   Culture NO GROWTH 2 DAYS  Final   Report Status PENDING  Incomplete  MRSA PCR Screening     Status: None   Collection Time: 01/04/16  2:45  AM  Result Value Ref Range Status   MRSA by PCR NEGATIVE NEGATIVE Final    Comment:        The GeneXpert MRSA Assay (FDA approved for NASAL specimens only), is one component of a comprehensive MRSA colonization surveillance program. It is not intended to diagnose MRSA infection nor to guide or monitor treatment for MRSA infections.      Studies:  Recent x-ray studies have been reviewed in detail by the Attending Physician  Scheduled Meds:  Scheduled Meds: . antiseptic oral rinse  7 mL Mouth Rinse q12n4p  .  ceFAZolin (ANCEF) IV  2 g Intravenous Q8H  . chlorhexidine  15 mL Mouth Rinse BID  . feeding supplement (PRO-STAT SUGAR FREE 64)  60 mL Per Tube BID  . ferrous sulfate  300 mg Per Tube BID WC  . free water  200 mL Oral 4 times per day  . lacosamide  100 mg Per Tube BID  . levETIRAcetam  1,500 mg Per Tube BID  . multivitamin with minerals  1 tablet Per Tube Q breakfast  . pantoprazole (PROTONIX) IV  40 mg Intravenous Q12H  . polyethylene glycol  17 g Per Tube Daily  . polyvinyl alcohol  2 drop Both Eyes TID  . sodium chloride flush  10-40 mL Intracatheter Q12H    Time spent on care of this patient: 40 mins   Bresha Hosack, Roselind Messier , MD  [facility] Office  [phone] Pager - [phone]  On-Call/Text Page:      [email or web address]      password [email or web address]  If 7PM-7AM, please contact night-coverage [email or web address] Password [email or web address] 01/05/2016, 10:33 PM   LOS: 2 days   Care during the described time interval was provided by me .  I have reviewed this patient's available data, including medical history, events of note, physical examination, and all test results as part of my evaluation. I have personally reviewed and interpreted all radiology studies.   Carolyne Littles, MD [phone] Pager

## 2016-01-05 NOTE — Progress Notes (Signed)
Pharmacy Antibiotic Note  Shereese Bonnie is a 50 y.o. female admitted on 01/03/2016 with sepsis and UTI.  Pharmacy has been consulted for cefazolin and vancomycin dosing. Patient has 1/2 CONS in blood (likely contaminant). Also with pan-sensitive proteus UTI  Plan: After discussion with Dr. Joseph Art, vancomycin and meropenem will be discontinued Initiate cefazolin 2 g q8h  Height:  (162.6 cm) Weight: 183 lb 3.2 oz (83.1 kg) IBW/kg (Calculated) : 54.7  Temp (24hrs), Avg:99.1 F (37.3 C), Min:97.2 F (36.2 C), Max:100.6 F (38.1 C)   Recent Labs Lab 01/03/16 1919 01/03/16 1936 01/03/16 2241 01/04/16 0055 01/04/16 0432 01/04/16 0929 01/05/16 0430  WBC  --  11.1*  --   --  11.6*  --   --   CREATININE  --  0.74 0.72  --  0.66 0.61 0.56  LATICACIDVEN 2.06*  --  1.8 1.5  --   --   --     Estimated Creatinine Clearance: 89.7 mL/min (by C-G formula based on Cr of 0.56).    Allergies  Allergen Reactions  . Amlodipine Swelling  . Latex Rash    Unknown   . Other Other (See Comments)    Natural Rubber- Unknown     Antimicrobials this admission: 2/28 zosyn x1 2/28 vanc>>3/2 2/28 meropenem>>3/2 3/2 Cefazolin  Microbiology results: 2/28 UC: proteus (pan-sensitive) 2/28 BCx2: CONS x 1 3/1 MRSA pcr: neg  Isaac Bliss, PharmD, BCPS, [facility] Clinical Pharmacist Pager [phone] 01/05/2016 10:35 AM

## 2016-01-06 ENCOUNTER — Ambulatory Visit: Payer: Medicaid Other | Admitting: Surgery

## 2016-01-06 LAB — GLUCOSE, CAPILLARY
GLUCOSE-CAPILLARY: 123 mg/dL — AB (ref 65–99)
GLUCOSE-CAPILLARY: 119 mg/dL — AB (ref 65–99)
Glucose-Capillary: 121 mg/dL — ABNORMAL HIGH (ref 65–99)
Glucose-Capillary: 116 mg/dL — ABNORMAL HIGH (ref 65–99)
Glucose-Capillary: 106 mg/dL — ABNORMAL HIGH (ref 65–99)
Glucose-Capillary: 123 mg/dL — ABNORMAL HIGH (ref 65–99)

## 2016-01-06 LAB — CBC WITH DIFFERENTIAL/PLATELET
BASOS PCT: 0 %
Basophils Absolute: 0 10*3/uL (ref 0.0–0.1)
EOS ABS: 0.1 10*3/uL (ref 0.0–0.7)
Eosinophils Relative: 1 %
HCT: 24.5 % — ABNORMAL LOW (ref 36.0–46.0)
HEMOGLOBIN: 8 g/dL — AB (ref 12.0–15.0)
LYMPHS PCT: 17 %
Lymphs Abs: 1.7 10*3/uL (ref 0.7–4.0)
MCH: 28.5 pg (ref 26.0–34.0)
MCHC: 32.7 g/dL (ref 30.0–36.0)
MCV: 87.2 fL (ref 78.0–100.0)
MONOS PCT: 9 %
Monocytes Absolute: 0.9 10*3/uL (ref 0.1–1.0)
NEUTROS PCT: 73 %
Neutro Abs: 7.3 10*3/uL (ref 1.7–7.7)
PLATELETS: 387 10*3/uL (ref 150–400)
RBC: 2.81 MIL/uL — ABNORMAL LOW (ref 3.87–5.11)
RDW: 16.9 % — ABNORMAL HIGH (ref 11.5–15.5)
WBC: 10 10*3/uL (ref 4.0–10.5)

## 2016-01-06 LAB — BASIC METABOLIC PANEL
ANION GAP: 11 (ref 5–15)
BUN: 14 mg/dL (ref 6–20)
CHLORIDE: 108 mmol/L (ref 101–111)
CO2: 19 mmol/L — ABNORMAL LOW (ref 22–32)
Calcium: 8.3 mg/dL — ABNORMAL LOW (ref 8.9–10.3)
Creatinine, Ser: 0.54 mg/dL (ref 0.44–1.00)
GFR calc Af Amer: >60 mL/min (ref >60)
Glucose, Bld: 117 mg/dL — ABNORMAL HIGH (ref 65–99)
POTASSIUM: 3.5 mmol/L (ref 3.5–5.1)
SODIUM: 138 mmol/L (ref 135–145)

## 2016-01-06 MED ORDER — JEVITY 1.5 CAL/FIBER PO LIQD
1000.0000 mL | ORAL | Status: DC
  Administered 2016-01-06 – 2016-01-11 (×4): 1000 mL
  Filled 2016-01-06 (×9): qty 1000

## 2016-01-06 MED ORDER — CEFUROXIME AXETIL 250 MG/5ML PO SUSR
500.0000 mg | Freq: Two times a day (BID) | ORAL | Status: DC
  Administered 2016-01-06 – 2016-01-08 (×4): 500 mg
  Filled 2016-01-06 (×5): qty 10

## 2016-01-06 NOTE — Progress Notes (Signed)
[place] TEAM 1 - Stepdown/ICU TEAM PROGRESS NOTE  [name] BJY:[id number] DOB: 11-29-1965 DOA: 01/03/2016 PCP: Ruthe Mannan, MD  Admit HPI / Brief Narrative: 50 y.o. 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 Patient is chronically ill and was admitted to this institution from 12/03/2015 - 12/08/2015 with sepsis secondary to UTI with ESBL-producing Klebsiella bacteremia. There was also suspicion for bacterial infection of a stage IV decubitus sacral ulcer at that time. Patient was ultimately discharged back to her SNF. The day of this admit a SNF worker noted 10-15 mL dark red blood per rectum and EMS was activated for transport to the hospital. There were no other concerns or notable changes identified by the SNF personnel. Patient is nonverbal at baseline and was unable to contribute to history.  In ED, patient was found tobe afebrile, saturating well on room air, with tachycardia to the 120s and blood pressure in the 80s systolic. Chest x-ray was obtained and negative for acute cardiopulmonary disease. Initial blood work was notable for sodium of 153, BUN of 42, leukocytosis to 11,100, and hemoglobin of 8.4. Lactic acid was elevated at 2.06. Urine was notable for many bacteria, moderate leukocytes, and negative nitrite. Blood and urine cultures were obtained, empiric vancomycin and Zosyn was administered, and a 30 cc/kg NS bolus was given. Patient's heart rate improved down to the low 100s but blood pressure remained in the 90/60 range.   HPI/Subjective: The pt is nonverbal (at baseline).  She does not appear to be in acute distress or pain.  She does follow the examiner w/ her eyes.    Assessment/Plan:  Sepsis - Proteus mirabilis UTI -narrowed abx to cefazolin - will now transition to per tube  ceftin to complete 7 full days of tx   Coag negative Staph in 1 of 2 blood cx - Most c/w contaminant - d/c Vanc   GI bleed - SNF personnel describe 10-15 mL dark red blood per rectum - FOBT positive in ED  - hx of lower GIB with flex sig on 10/25/15 demonstrating rectosigmoid colon ulcers that were attributed to ischemia from constipation  - baseline Hgb ~8.4 - Hgb decreased w/ volume resuscitation, but stabilized at ~8.0, w/ no evidence of ongoing bleeding - recheck in AM   Normocytic anemia  - Likely secondary to chronic disease +/- element of acute blood loss - recheck in AM    Sacral decubitus ulcer  - Stage IV, previously managed with wound vac  - Presents with wound vac dressings in place, but no wound vac  - WOC evaluated and is addressing care   Essential hypertension - BP reasonably controlled at this time    Seizure disorder  - Reportedly stable on Vimpat and Keppra - continue  - No suggestion of active seizure activity   Hypernatremia  - most c/w hypovolemic cause - resolved w/ simple volume expansion / free water via PEG   Code Status: FULL Family Communication: no family present at time of exam Disposition Plan: SDU - plan for return to SNF in AM if Hgb stable over night   Consultants: none  Procedures: none  Antibiotics: Meropenem 2/28 > 3/2 Vanc 2/28 > 3/2 Cefazolin 3/2 > 3/3 Ceftin 3/3 >  DVT prophylaxis: SCDs only w/ ?GIB  Objective: Blood pressure 142/96, pulse 123, temperature 98.7 F (  37.1 C), temperature source Oral, resp. rate 38, height  (1.626 m), weight 84.46 kg (186 lb 3.2 oz), SpO2 99 %.  Intake/Output Summary (Last 24 hours) at 01/06/16 1454 Last data filed at 01/06/16 1100  Gross per 24 hour  Intake   2835 ml  Output   1900 ml  Net    935 ml   Exam: General: No acute respiratory distress - alert  Lungs: Clear to auscultation bilaterally  Cardiovascular: tachycardic - regular - no M Abdomen: Nondistended, soft,  bowel sounds positive, PEG insertion clean  Extremities: No significant cyanosis, clubbing, edema bilateral lower extremities  Data Reviewed:  Basic Metabolic Panel:  Recent Labs Lab 01/03/16 2241 01/04/16 0432 01/04/16 0929 01/05/16 0430 01/06/16 0628  NA 152* 152* 152* 146* 138  K 4.1 4.1 4.7 3.6 3.5  CL 120* 120* 126* 119* 108  CO2 22 21* 18* 20* 19*  GLUCOSE 95 98 76 96 117*  BUN 36* 31* 27* 17 14  CREATININE 0.72 0.66 0.61 0.56 0.54  CALCIUM 8.8* 8.8* 8.7* 8.7* 8.3*    CBC:  Recent Labs Lab 01/03/16 1936 01/04/16 0107 01/04/16 0432 01/06/16 0628  WBC 11.1*  --  11.6* 10.0  NEUTROABS 7.8*  --  9.2* 7.3  HGB 8.4* 7.8* 7.7* 8.0*  HCT 27.6* 26.1* 25.9* 24.5*  MCV 92.9  --  92.2 87.2  PLT 481*  --  430* 387    Liver Function Tests:  Recent Labs Lab 01/03/16 1936 01/05/16 0430  AST 17 14*  ALT 19 16  ALKPHOS 67 77  BILITOT 0.3 0.4  PROT 7.0 5.7*  ALBUMIN 2.2* 1.8*    Coags:  Recent Labs Lab 01/04/16 0432  INR 1.24    Recent Labs Lab 01/04/16 0432  APTT 28    CBG:  Recent Labs Lab 01/05/16 1917 01/05/16 2349 01/06/16 0338 01/06/16 0831 01/06/16 1152  GLUCAP 102* 121* 116* 123* 106*    Recent Results (from the past 240 hour(s))  Urine culture     Status: None   Collection Time: 01/03/16  6:22 PM  Result Value Ref Range Status   Specimen Description URINE, CATHETERIZED  Final   Special Requests NONE  Final   Culture >=100,000 COLONIES/mL PROTEUS MIRABILIS  Final   Report Status 01/05/2016 FINAL  Final   Organism ID, Bacteria PROTEUS MIRABILIS  Final      Susceptibility   Proteus mirabilis - MIC*    AMPICILLIN <=2 SENSITIVE Sensitive     CEFAZOLIN 8 SENSITIVE Sensitive     CEFTRIAXONE <=1 SENSITIVE Sensitive     CIPROFLOXACIN >=4 RESISTANT Resistant     GENTAMICIN <=1 SENSITIVE Sensitive     IMIPENEM 4 SENSITIVE Sensitive     NITROFURANTOIN 128 RESISTANT Resistant     TRIMETH/SULFA <=20 SENSITIVE Sensitive      AMPICILLIN/SULBACTAM <=2 SENSITIVE Sensitive     PIP/TAZO <=4 SENSITIVE Sensitive     * >=100,000 COLONIES/mL PROTEUS MIRABILIS  Blood Culture (routine x 2)     Status: None (Preliminary result)   Collection Time: 01/03/16  7:00 PM  Result Value Ref Range Status   Specimen Description BLOOD RIGHT HAND  Final   Special Requests BOTTLES DRAWN AEROBIC ONLY 6CC  Final   Culture  Setup Time   Final    GRAM POSITIVE COCCI IN CLUSTERS AEROBIC BOTTLE ONLY CRITICAL RESULT CALLED TO, READ BACK BY AND VERIFIED WITH: Cindee Lame RN 13:45 01/04/16 ([email or web address])    Culture   Final    STAPHYLOCOCCUS  SPECIES (COAGULASE NEGATIVE) THE SIGNIFICANCE OF ISOLATING THIS ORGANISM FROM A SINGLE SET OF BLOOD CULTURES WHEN MULTIPLE SETS ARE DRAWN IS UNCERTAIN. PLEASE NOTIFY THE MICROBIOLOGY DEPARTMENT WITHIN ONE WEEK IF SPECIATION AND SENSITIVITIES ARE REQUIRED.    Report Status PENDING  Incomplete  Blood Culture (routine x 2)     Status: None (Preliminary result)   Collection Time: 01/03/16  7:05 PM  Result Value Ref Range Status   Specimen Description BLOOD RIGHT ANTECUBITAL  Final   Special Requests   Final    BOTTLES DRAWN AEROBIC AND ANAEROBIC BLUE 10CC RED 5CC   Culture NO GROWTH 3 DAYS  Final   Report Status PENDING  Incomplete  MRSA PCR Screening     Status: None   Collection Time: 01/04/16  2:45 AM  Result Value Ref Range Status   MRSA by PCR NEGATIVE NEGATIVE Final    Comment:        The GeneXpert MRSA Assay (FDA approved for NASAL specimens only), is one component of a comprehensive MRSA colonization surveillance program. It is not intended to diagnose MRSA infection nor to guide or monitor treatment for MRSA infections.      Studies:   Recent x-ray studies have been reviewed in detail by the Attending Physician  Scheduled Meds:  Scheduled Meds: . antiseptic oral rinse  7 mL Mouth Rinse q12n4p  .  ceFAZolin (ANCEF) IV  2 g Intravenous Q8H  . chlorhexidine  15 mL Mouth Rinse BID  . feeding  supplement (PRO-STAT SUGAR FREE 64)  60 mL Per Tube BID  . ferrous sulfate  300 mg Per Tube BID WC  . free water  200 mL Oral 4 times per day  . lacosamide  100 mg Per Tube BID  . levETIRAcetam  1,500 mg Per Tube BID  . multivitamin with minerals  1 tablet Per Tube Q breakfast  . pantoprazole (PROTONIX) IV  40 mg Intravenous Q12H  . polyethylene glycol  17 g Per Tube Daily  . polyvinyl alcohol  2 drop Both Eyes TID  . sodium chloride flush  10-40 mL Intracatheter Q12H    Time spent on care of this patient: 35 mins   Harlow Carrizales T , MD   [facility] Office  [phone] Pager - Text Page per Loretha [email or web address] as per below:  On-Call/Text Page:      [email or web address]      password [email or web address]  If 7PM-7AM, please contact night-coverage [email or web address] Password [email or web address] 01/06/2016, 2:54 PM   LOS: 3 days

## 2016-01-06 NOTE — Consult Note (Addendum)
WOC wound consult note Reason for Consult: Pt has been using a negative pressure device to the sacrum wound prior to admission. Wound appearance has not changed since previous assessment; refer to progress notes on 3/1 for measurements and description. 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 One piece black foam to 125mm cont suction. Another piece bridged to hip to reduce pressure from the track pad. Pt tolerated without discomfort. Plan for dressing change Q M/W/F. No family members at bedside to discuss plan of care. Cammie Mcgeeawn Dorie Ohms MSN, RN, CWOCN, [place], CNS [phone]

## 2016-01-06 NOTE — Care Management Note (Signed)
Case Management Note  Patient Details  Name: [name] MRN: [id number] Date of Birth: 11/19/1965  Subjective/Objective:   [place] [facility] [place], patient will be ready to dc on 3/4 , CSW aware.                 Action/Plan:   Expected Discharge Date:                  Expected Discharge Plan:  Skilled Nursing Facility  In-House Referral:  Clinical Social Work  Discharge planning Services  CM Consult  Post Acute Care Choice:    Choice offered to:     DME Arranged:    DME Agency:     HH Arranged:    HH Agency:     Status of Service:  Completed, signed off  Medicare Important Message Given:    Date Medicare IM Given:    Medicare IM give by:    Date Additional Medicare IM Given:    Additional Medicare Important Message give by:     If discussed at Long Length of Stay Meetings, dates discussed:    Additional Comments:  [name], Haydon Kalmar Clinton, RN 01/06/2016, 4:21 PM

## 2016-01-07 DIAGNOSIS — R000 Tachycardia, unspecified: Secondary | ICD-10-CM

## 2016-01-07 LAB — D-DIMER, QUANTITATIVE (NOT AT ARMC): D DIMER QUANT: 2.38 ug{FEU}/mL — AB (ref 0.00–0.50)

## 2016-01-07 LAB — COMPREHENSIVE METABOLIC PANEL
ALBUMIN: 1.9 g/dL — AB (ref 3.5–5.0)
ALT: 11 U/L — AB (ref 14–54)
AST: 18 U/L (ref 15–41)
Alkaline Phosphatase: 63 U/L (ref 38–126)
Anion gap: 8 (ref 5–15)
BUN: 13 mg/dL (ref 6–20)
CHLORIDE: 106 mmol/L (ref 101–111)
CO2: 21 mmol/L — AB (ref 22–32)
CREATININE: 0.56 mg/dL (ref 0.44–1.00)
Calcium: 8.4 mg/dL — ABNORMAL LOW (ref 8.9–10.3)
GFR calc non Af Amer: >60 mL/min (ref >60)
Glucose, Bld: 123 mg/dL — ABNORMAL HIGH (ref 65–99)
Potassium: 3.4 mmol/L — ABNORMAL LOW (ref 3.5–5.1)
SODIUM: 135 mmol/L (ref 135–145)
Total Bilirubin: 0.1 mg/dL — ABNORMAL LOW (ref 0.3–1.2)
Total Protein: 5.7 g/dL — ABNORMAL LOW (ref 6.5–8.1)

## 2016-01-07 LAB — GLUCOSE, CAPILLARY
GLUCOSE-CAPILLARY: 108 mg/dL — AB (ref 65–99)
GLUCOSE-CAPILLARY: 108 mg/dL — AB (ref 65–99)
Glucose-Capillary: 116 mg/dL — ABNORMAL HIGH (ref 65–99)
Glucose-Capillary: 116 mg/dL — ABNORMAL HIGH (ref 65–99)
Glucose-Capillary: 134 mg/dL — ABNORMAL HIGH (ref 65–99)
Glucose-Capillary: 101 mg/dL — ABNORMAL HIGH (ref 65–99)

## 2016-01-07 LAB — CBC
HCT: 26.9 % — ABNORMAL LOW (ref 36.0–46.0)
Hemoglobin: 8.6 g/dL — ABNORMAL LOW (ref 12.0–15.0)
MCH: 27.4 pg (ref 26.0–34.0)
MCHC: 32 g/dL (ref 30.0–36.0)
MCV: 85.7 fL (ref 78.0–100.0)
PLATELETS: 373 10*3/uL (ref 150–400)
RBC: 3.14 MIL/uL — AB (ref 3.87–5.11)
RDW: 16.8 % — ABNORMAL HIGH (ref 11.5–15.5)
WBC: 8.1 10*3/uL (ref 4.0–10.5)

## 2016-01-07 LAB — CULTURE, BLOOD (ROUTINE X 2)

## 2016-01-07 LAB — TSH: TSH: 1.913 u[IU]/mL (ref 0.350–4.500)

## 2016-01-07 MED ORDER — LISINOPRIL 10 MG PO TABS
10.0000 mg | ORAL_TABLET | Freq: Every day | ORAL | Status: DC
  Administered 2016-01-07 – 2016-01-11 (×5): 10 mg
  Filled 2016-01-07 (×5): qty 1

## 2016-01-07 MED ORDER — POTASSIUM CHLORIDE 20 MEQ/15ML (10%) PO SOLN
40.0000 meq | Freq: Once | ORAL | Status: AC
  Administered 2016-01-07: 40 meq
  Filled 2016-01-07: qty 30

## 2016-01-07 MED ORDER — METOPROLOL TARTRATE 50 MG PO TABS
50.0000 mg | ORAL_TABLET | Freq: Two times a day (BID) | ORAL | Status: DC
  Administered 2016-01-07 – 2016-01-10 (×7): 50 mg
  Filled 2016-01-07 (×7): qty 1

## 2016-01-07 MED ORDER — FREE WATER
100.0000 mL | Freq: Three times a day (TID) | Status: DC
  Administered 2016-01-07 – 2016-01-11 (×11): 100 mL via ORAL

## 2016-01-07 MED ORDER — ENOXAPARIN SODIUM 40 MG/0.4ML ~~LOC~~ SOLN
40.0000 mg | SUBCUTANEOUS | Status: DC
  Administered 2016-01-07 – 2016-01-10 (×4): 40 mg via SUBCUTANEOUS
  Filled 2016-01-07 (×4): qty 0.4

## 2016-01-07 MED ORDER — RANITIDINE HCL 150 MG/10ML PO SYRP
150.0000 mg | ORAL_SOLUTION | Freq: Two times a day (BID) | ORAL | Status: DC
  Administered 2016-01-07 – 2016-01-11 (×8): 150 mg
  Filled 2016-01-07 (×13): qty 10

## 2016-01-07 MED ORDER — METOPROLOL TARTRATE 1 MG/ML IV SOLN: 10.0000 mg | Freq: Four times a day (QID) | INTRAVENOUS | Status: DC | PRN

## 2016-01-07 MED ORDER — METOPROLOL TARTRATE 50 MG PO TABS: 50.0000 mg | ORAL_TABLET | Freq: Two times a day (BID) | ORAL | Status: DC

## 2016-01-07 NOTE — Progress Notes (Signed)
CSW spoke with patient's son, [name]. [place] [facility] [place] return [facility] by [facility] once medically stable.  [name] LCSWA [phone]

## 2016-01-07 NOTE — Progress Notes (Signed)
[place] TEAM 1 - Stepdown/ICU TEAM PROGRESS NOTE  Meghan PoreLaura Marie Lawson ZOX:[id number] DOB: 06/08/1966 DOA: 01/03/2016 PCP: [name], MD  Admit HPI / Brief Narrative: 50 y.o. 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 Patient is chronically ill and was admitted to this institution from 12/03/2015 - 12/08/2015 with sepsis secondary to UTI with ESBL-producing Klebsiella bacteremia. There was also suspicion for bacterial infection of a stage IV decubitus sacral ulcer at that time. Patient was ultimately discharged back to her SNF. The day of this admit a SNF worker noted 10-15 mL dark red blood per rectum and EMS was activated for transport to the hospital. There were no other concerns or notable changes identified by the SNF personnel. Patient is nonverbal at baseline and was unable to contribute to history.  In ED, patient was found tobe afebrile, saturating well on room air, with tachycardia to the 120s and blood pressure in the 80s systolic. Chest x-ray was obtained and negative for acute cardiopulmonary disease. Initial blood work was notable for sodium of 153, BUN of 42, leukocytosis to 11,100, and hemoglobin of 8.4. Lactic acid was elevated at 2.06. Urine was notable for many bacteria, moderate leukocytes, and negative nitrite. Blood and urine cultures were obtained, empiric vancomycin and Zosyn was administered, and a 30 cc/kg NS bolus was given. Patient's heart rate improved down to the low 100s but blood pressure remained in the 90/60 range.   HPI/Subjective: The pt is nonverbal (at baseline).  She does not appear to be in acute distress or pain.  She is noted to be tachycardic w/ HR sustaining at 120bpm.    Assessment/Plan:  Sepsis - Proteus mirabilis UTI -transition to per tube ceftin to complete 7  full days of tx - afebrile - WBC normal   Coag negative Staph in 1 of 2 blood cx -most c/w contaminant - d/c Vanc   Sinus tachycardia -this was initially felt to be due to sepsis, but it persists despite resolution of sepsis clinically - perhaps this is simply BB withdrawal - resume metoprolol now that BP stable - check TSH - check d-dimer - may require eval for PE   GI bleed - SNF personnel describe 10-15 mL dark red blood per rectum - FOBT positive in ED  - hx of lower GIB with flex sig on 10/25/15 demonstrating rectosigmoid colon ulcers that were attributed to ischemia from constipation  - baseline Hgb ~8.4 - Hgb decreased w/ volume resuscitation, but stabilized at ~8.0, w/ no evidence of ongoing bleeding - Hgb now increasing   Normocytic anemia  - Likely secondary to chronic disease +/- element of acute blood loss - see discussion above   Sacral decubitus ulcer  - Stage IV, previously managed with wound vac  - Presents with wound vac dressings in place, but no wound vac  - WOC evaluated and is addressing care - VAC in use - foley required to keep wound dry to allow VAC to adhere appropriately - plan to keep foley in place at time of d/c   Essential hypertension - BP has been climbing - resume usual BP meds and follow   Seizure disorder  - Reportedly stable on Vimpat and Keppra - continue  - No suggestion of active seizure activity   Hypernatremia  - most c/w hypovolemic cause - resolved w/ simple  volume expansion / free water via PEG   Code Status: FULL Family Communication: no family present at time of exam Disposition Plan: SDU - delay d/c to SNF due to persisting tachycardia - possible d/c 3/5 if tachy resolved   Consultants: none  Procedures: none  Antibiotics: Meropenem 2/28 > 3/2 Vanc 2/28 > 3/2 Cefazolin 3/2 > 3/3 Ceftin 3/3 >  DVT prophylaxis: SCDs + lovenox   Objective: Blood pressure 119/91, pulse 122, temperature 99 F (37.2 C),  temperature source Axillary, resp. rate 19, height  (1.626 m), weight 84.9 kg (187 lb 2.7 oz), SpO2 100 %.  Intake/Output Summary (Last 24 hours) at 01/07/16 1309 Last data filed at 01/07/16 1000  Gross per 24 hour  Intake 2503.92 ml  Output   1825 ml  Net 678.92 ml   Exam: General: No acute respiratory distress  Lungs: Clear to auscultation bilaterally - no wheeze  Cardiovascular: tachycardic - regular - no M Abdomen: Nondistended, soft, bowel sounds positive, PEG insertion clean and dry  Extremities: No significant cyanosis, clubbing, or edema bilateral lower extremities  Data Reviewed:  Basic Metabolic Panel:  Recent Labs Lab 01/04/16 0432 01/04/16 0929 01/05/16 0430 01/06/16 0628 01/07/16 0537  NA 152* 152* 146* 138 135  K 4.1 4.7 3.6 3.5 3.4*  CL 120* 126* 119* 108 106  CO2 21* 18* 20* 19* 21*  GLUCOSE 98 76 96 117* 123*  BUN 31* 27* CREATININE 0.66 0.61 0.56 0.54 0.56  CALCIUM 8.8* 8.7* 8.7* 8.3* 8.4*    CBC:  Recent Labs Lab 01/03/16 1936 01/04/16 0107 01/04/16 0432 01/06/16 0628 01/07/16 1033  WBC 11.1*  --  11.6* 10.0 8.1  NEUTROABS 7.8*  --  9.2* 7.3  --   HGB 8.4* 7.8* 7.7* 8.0* 8.6*  HCT 27.6* 26.1* 25.9* 24.5* 26.9*  MCV 92.9  --  92.2 87.2 85.7  PLT 481*  --  430* 387 373    Liver Function Tests:  Recent Labs Lab 01/03/16 1936 01/05/16 0430 01/07/16 0537  AST 17 14* 18  ALT 19 16 11*  ALKPHOS 67 77 63  BILITOT 0.3 0.4 0.1*  PROT 7.0 5.7* 5.7*  ALBUMIN 2.2* 1.8* 1.9*    Coags:  Recent Labs Lab 01/04/16 0432  INR 1.24    Recent Labs Lab 01/04/16 0432  APTT 28    CBG:  Recent Labs Lab 01/06/16 1638 01/06/16 1922 01/07/16 0053 01/07/16 0359 01/07/16 0802  GLUCAP 123* 119* 116* 116* 108*    Recent Results (from the past 240 hour(s))  Urine culture     Status: None   Collection Time: 01/03/16  6:22 PM  Result Value Ref Range Status   Specimen Description URINE, CATHETERIZED  Final   Special  Requests NONE  Final   Culture >=100,000 COLONIES/mL PROTEUS MIRABILIS  Final   Report Status 01/05/2016 FINAL  Final   Organism ID, Bacteria PROTEUS MIRABILIS  Final      Susceptibility   Proteus mirabilis - MIC*    AMPICILLIN <=2 SENSITIVE Sensitive     CEFAZOLIN 8 SENSITIVE Sensitive     CEFTRIAXONE <=1 SENSITIVE Sensitive     CIPROFLOXACIN >=4 RESISTANT Resistant     GENTAMICIN <=1 SENSITIVE Sensitive     IMIPENEM 4 SENSITIVE Sensitive     NITROFURANTOIN 128 RESISTANT Resistant     TRIMETH/SULFA <=20 SENSITIVE Sensitive     AMPICILLIN/SULBACTAM <=2 SENSITIVE Sensitive     PIP/TAZO <=4 SENSITIVE Sensitive     * >=100,000  COLONIES/mL PROTEUS MIRABILIS  Blood Culture (routine x 2)     Status: None   Collection Time: 01/03/16  7:00 PM  Result Value Ref Range Status   Specimen Description BLOOD RIGHT HAND  Final   Special Requests BOTTLES DRAWN AEROBIC ONLY 6CC  Final   Culture  Setup Time   Final    GRAM POSITIVE COCCI IN CLUSTERS AEROBIC BOTTLE ONLY CRITICAL RESULT CALLED TO, READ BACK BY AND VERIFIED WITH: Cindee Lame RN 13:45 01/04/16 ([email or web address])    Culture   Final    STAPHYLOCOCCUS SPECIES (COAGULASE NEGATIVE) THE SIGNIFICANCE OF ISOLATING THIS ORGANISM FROM A SINGLE SET OF BLOOD CULTURES WHEN MULTIPLE SETS ARE DRAWN IS UNCERTAIN. PLEASE NOTIFY THE MICROBIOLOGY DEPARTMENT WITHIN ONE WEEK IF SPECIATION AND SENSITIVITIES ARE REQUIRED.    Report Status 01/07/2016 FINAL  Final  Blood Culture (routine x 2)     Status: None (Preliminary result)   Collection Time: 01/03/16  7:05 PM  Result Value Ref Range Status   Specimen Description BLOOD RIGHT ANTECUBITAL  Final   Special Requests   Final    BOTTLES DRAWN AEROBIC AND ANAEROBIC BLUE 10CC RED 5CC   Culture NO GROWTH 4 DAYS  Final   Report Status PENDING  Incomplete  MRSA PCR Screening     Status: None   Collection Time: 01/04/16  2:45 AM  Result Value Ref Range Status   MRSA by PCR NEGATIVE NEGATIVE Final    Comment:        The  GeneXpert MRSA Assay (FDA approved for NASAL specimens only), is one component of a comprehensive MRSA colonization surveillance program. It is not intended to diagnose MRSA infection nor to guide or monitor treatment for MRSA infections.      Studies:   Recent x-ray studies have been reviewed in detail by the Attending Physician  Scheduled Meds:  Scheduled Meds: . antiseptic oral rinse  7 mL Mouth Rinse q12n4p  . cefUROXime  500 mg Per Tube Q12H  . chlorhexidine  15 mL Mouth Rinse BID  . feeding supplement (PRO-STAT SUGAR FREE 64)  60 mL Per Tube BID  . ferrous sulfate  300 mg Per Tube BID WC  . free water  200 mL Oral 4 times per day  . lacosamide  100 mg Per Tube BID  . levETIRAcetam  1,500 mg Per Tube BID  . multivitamin with minerals  1 tablet Per Tube Q breakfast  . pantoprazole (PROTONIX) IV  40 mg Intravenous Q12H  . polyethylene glycol  17 g Per Tube Daily  . polyvinyl alcohol  2 drop Both Eyes TID  . sodium chloride flush  10-40 mL Intracatheter Q12H    Time spent on care of this patient: 35 mins   Oluwaseyi Tull T , MD   [facility] Office  [phone] Pager - Text Page per Loretha Stapler as per below:  On-Call/Text Page:      [email or web address]      password [email or web address]  If 7PM-7AM, please contact night-coverage [email or web address] Password [email or web address] 01/07/2016, 1:09 PM   LOS: 4 days

## 2016-01-08 ENCOUNTER — Encounter (HOSPITAL_COMMUNITY): Payer: Self-pay | Admitting: Radiology

## 2016-01-08 ENCOUNTER — Inpatient Hospital Stay (HOSPITAL_COMMUNITY): Payer: Medicaid Other

## 2016-01-08 LAB — GLUCOSE, CAPILLARY
GLUCOSE-CAPILLARY: 112 mg/dL — AB (ref 65–99)
GLUCOSE-CAPILLARY: 117 mg/dL — AB (ref 65–99)
GLUCOSE-CAPILLARY: 123 mg/dL — AB (ref 65–99)
Glucose-Capillary: 93 mg/dL (ref 65–99)
Glucose-Capillary: 119 mg/dL — ABNORMAL HIGH (ref 65–99)
Glucose-Capillary: 95 mg/dL (ref 65–99)

## 2016-01-08 LAB — COMPREHENSIVE METABOLIC PANEL
ALK PHOS: 64 U/L (ref 38–126)
ALT: 11 U/L — ABNORMAL LOW (ref 14–54)
ANION GAP: 9 (ref 5–15)
AST: 21 U/L (ref 15–41)
Albumin: 1.8 g/dL — ABNORMAL LOW (ref 3.5–5.0)
BILIRUBIN TOTAL: 0.4 mg/dL (ref 0.3–1.2)
BUN: 17 mg/dL (ref 6–20)
CALCIUM: 8.1 mg/dL — AB (ref 8.9–10.3)
CO2: 21 mmol/L — ABNORMAL LOW (ref 22–32)
Chloride: 102 mmol/L (ref 101–111)
Creatinine, Ser: 0.43 mg/dL — ABNORMAL LOW (ref 0.44–1.00)
Glucose, Bld: 109 mg/dL — ABNORMAL HIGH (ref 65–99)
POTASSIUM: 3.9 mmol/L (ref 3.5–5.1)
Sodium: 132 mmol/L — ABNORMAL LOW (ref 135–145)
TOTAL PROTEIN: 5.7 g/dL — AB (ref 6.5–8.1)

## 2016-01-08 LAB — CBC
HEMATOCRIT: 23.5 % — AB (ref 36.0–46.0)
Hemoglobin: 7.4 g/dL — ABNORMAL LOW (ref 12.0–15.0)
MCH: 27 pg (ref 26.0–34.0)
MCHC: 31.5 g/dL (ref 30.0–36.0)
MCV: 85.8 fL (ref 78.0–100.0)
Platelets: 360 10*3/uL (ref 150–400)
RBC: 2.74 MIL/uL — ABNORMAL LOW (ref 3.87–5.11)
RDW: 17.1 % — AB (ref 11.5–15.5)
WBC: 6 10*3/uL (ref 4.0–10.5)

## 2016-01-08 LAB — CULTURE, BLOOD (ROUTINE X 2): CULTURE: NO GROWTH

## 2016-01-08 MED ORDER — CEFUROXIME AXETIL 125 MG/5ML PO SUSR
500.0000 mg | Freq: Two times a day (BID) | ORAL | Status: AC
  Administered 2016-01-08 – 2016-01-09 (×3): 500 mg
  Filled 2016-01-08 (×3): qty 20

## 2016-01-08 MED ORDER — IOHEXOL 350 MG/ML SOLN
100.0000 mL | Freq: Once | INTRAVENOUS | Status: AC | PRN
Start: 2016-01-08 — End: 2016-01-08
  Administered 2016-01-08: 80 mL via INTRAVENOUS

## 2016-01-08 MED ORDER — CEFUROXIME AXETIL 125 MG/5ML PO SUSR
500.0000 mg | Freq: Two times a day (BID) | ORAL | Status: DC
  Filled 2016-01-08: qty 20

## 2016-01-08 NOTE — Clinical Social Work Note (Signed)
Clinical Social Work Assessment  Patient Details  Name: Meghan PoreLaura Marie Lawson MRN: [id number] Date of Birth: 06/29/1966  Date of referral:  01/08/16               Reason for consult:  Facility Placement (From SNF)                Permission sought to share information with:  Case Manager, Facility Medical sales representativeContact Representative, Family Supports Permission granted to share information::     Name::     Torric (Son)  Agency::  [facility]  Relationship::     Contact Information:     Housing/Transportation Living arrangements for the past 2 months:  Skilled Building surveyorursing Facility Source of Information:  Medical Team, Adult Children Patient Interpreter Needed:  None Criminal Activity/Legal Involvement Pertinent to Current Situation/Hospitalization:  No - Comment as needed Significant Relationships:  Adult Children, Merchandiser, retailCommunity Support Lives with:  Facility Resident Do you feel safe going back to the place where you live?  Yes Need for family participation in patient care:  Yes (Comment) (Son involved)  Care giving concerns:  Per son and CSW note, no concerns of placement and will return   Office managerocial Worker assessment / plan:  LCSW following for DC planning. Patient is nonverbal, thus assessment and information obtained from family (son). No concerns of care at facility ([facility]) Patient will return once medically stable and cleared for DC. LCSW will update FL2 and if DC over weekend, will facilitate, if not will let weekday SW aware to complete DC planning need.  Employment status:  Disabled (Comment on whether or not currently receiving Disability) Insurance information:    PT Recommendations:  Skilled Nursing Facility Information / Referral to community resources:  Skilled Nursing Facility  Patient/Family's Response to care:  Agreeable to plan  Patient/Family's Understanding of and Emotional Response to Diagnosis, Current Treatment, and Prognosis:  Patient is at baseline.  Communication completed with  family, aware and agreeable to plan.   Emotional Assessment Appearance:  Appears stated age Attitude/Demeanor/Rapport:  Other (calm and cooperative) Affect (typically observed):  Accepting, Adaptable Orientation:  Oriented to Self (patient is nonverbal) Alcohol / Substance use:  Not Applicable Psych involvement (Current and /or in the community):  No (Comment)  Discharge Needs  Concerns to be addressed:  No discharge needs identified Readmission within the last 30 days:  Yes Current discharge risk:  None Barriers to Discharge:  No Barriers Identified, Continued Medical Work up   Raye SorrowCoble, Audrielle Vankuren N, LCSW 01/08/2016, 8:49 AM

## 2016-01-08 NOTE — [place] FL2 (Signed)
[place] MEDICAID FL2 LEVEL OF CARE SCREENING TOOL     IDENTIFICATION  Patient Name: [name] Birthdate: 06/26/1966 Sex: female Admission Date (Current Location): 01/03/2016  County and Medicaid Number:  [place]   Facility and Address:  [facility] [place], [address], [place], [place] [address]      Provider Number: [id number]  Attending Physician Name and Address:  Jeffrey T McClung, MD  Relative Name and Phone Number:       Current Level of Care: Hospital Recommended Level of Care: Skilled Nursing Facility Prior Approval Number:    Date Approved/Denied:   PASRR Number:    Discharge Plan: SNF    Current Diagnoses: Patient Active Problem List   Diagnosis Date Noted  . Gastrointestinal hemorrhage associated with acute gastritis   . Sacral decubitus ulcer   . Essential hypertension   . Seizure disorder (HCC)   . SIRS (systemic inflammatory response syndrome) (HCC) 01/03/2016  . Sepsis (HCC) 01/03/2016  . GI bleed 01/03/2016  . Bacteremia due to Klebsiella pneumoniae 12/06/2015  . Sepsis secondary to UTI (HCC) 12/05/2015  . UTI (urinary tract infection) due to urinary indwelling Foley catheter (HCC) 12/05/2015  . Leukocytosis 12/05/2015  . FTT (failure to thrive) in adult 12/05/2015  . Benign essential HTN 12/05/2015  . Hypokalemia 12/05/2015  . Normocytic anemia 12/04/2015  . History of stroke with current residual effects 12/04/2015  . S/P percutaneous endoscopic gastrostomy (PEG) tube placement (HCC) 12/04/2015  . Palliative care encounter 08/29/2015  . Decubitus ulcer of sacral region, stage 4 (HCC) 08/28/2015  . Hx of ischemic right ACA stroke 05/06/2015  . History of ischemic left ACA stroke 05/06/2015  . Dysphagia   . Hypernatremia 03/16/2015  . Seizures (HCC) 07/05/2014    Orientation RESPIRATION BLADDER Height & Weight     Self  O2 (5L) Incontinent (foley to go with patient at DC) Weight: 184 lb 1.4 oz (83.5  kg) Height:  5' 4" (162.6 cm)  BEHAVIORAL SYMPTOMS/MOOD NEUROLOGICAL BOWEL NUTRITION STATUS  Other (Comment) (None noted/no behaviors) Convulsions/Seizures (hx, but no change in care plans) Incontinent Feeding tube (Continue 60 ml Prostat liquid protein BID via PEG tube, providing an additional 400 kcal and 60 grams of protein.)  AMBULATORY STATUS COMMUNICATION OF NEEDS Skin   Extensive Assist Non-Verbally Other (Comment), PU Stage and Appropriate Care, Wound Vac (Pt has been using a negative pressure device to the sacrum wound prior to admission.)       PU Stage 4 Dressing: TID               Personal Care Assistance Level of Assistance              Functional Limitations Info  Sight, Hearing, Speech Sight Info: Adequate Hearing Info: Adequate Speech Info: Impaired    SPECIAL CARE FACTORS FREQUENCY  Speech therapy             Speech Therapy Frequency: daily for diet      Contractures Contractures Info: Not present    Additional Factors Info  Isolation Precautions, Code Status, Allergies, Psychotropic, Insulin Sliding Scale Code Status Info: Full Code Allergies Info: Amlodipine Psychotropic Info: None listed Insulin Sliding Scale Info: None noted Isolation Precautions Info: On contact percautions  MRSA     Current Medications (01/08/2016):  This is the current hospital active medication list Current Facility-Administered Medications  Medication Dose Route Frequency Provider Last Rate Last Dose  . 0.45 % sodium chloride infusion   Intravenous   Continuous Jeffrey T McClung, MD 10 mL/hr at 01/07/16 2000    . acetaminophen (TYLENOL) tablet 650 mg  650 mg Per Tube Q6H PRN Jeffrey T McClung, MD   650 mg at 01/07/16 2131   Or  . acetaminophen (TYLENOL) suppository 650 mg  650 mg Rectal Q6H PRN Jeffrey T McClung, MD      . antiseptic oral rinse (CPC / CETYLPYRIDINIUM CHLORIDE 0.05%) solution 7 mL  7 mL Mouth Rinse q12n4p Jeffrey T McClung, MD   7 mL at 01/07/16 1526  .  bisacodyl (DULCOLAX) suppository 10 mg  10 mg Rectal Daily PRN Timothy S Opyd, MD      . cefUROXime (CEFTIN) 250 MG/5ML suspension 500 mg  500 mg Per Tube Q12H Jeffrey T McClung, MD   500 mg at 01/07/16 2130  . chlorhexidine (PERIDEX) 0.12 % solution 15 mL  15 mL Mouth Rinse BID Jeffrey T McClung, MD   15 mL at 01/07/16 2132  . enoxaparin (LOVENOX) injection 40 mg  40 mg Subcutaneous Q24H Jeffrey T McClung, MD   40 mg at 01/07/16 1525  . feeding supplement (JEVITY 1.5 CAL/FIBER) liquid 1,000 mL  1,000 mL Per Tube Continuous Jeffrey T McClung, MD 55 mL/hr at 01/06/16 2244 1,000 mL at 01/06/16 2244  . feeding supplement (PRO-STAT SUGAR FREE 64) liquid 60 mL  60 mL Per Tube BID Timothy S Opyd, MD   60 mL at 01/07/16 2132  . ferrous sulfate 300 (60 Fe) MG/5ML syrup 300 mg  300 mg Per Tube BID WC Timothy S Opyd, MD   300 mg at 01/07/16 1835  . free water 100 mL  100 mL Oral 3 times per day Jeffrey T McClung, MD   100 mL at 01/08/16 0600  . HYDROmorphone (DILAUDID) injection 0.5 mg  0.5 mg Intravenous Q2H PRN Timothy S Opyd, MD   0.5 mg at 01/07/16 1025  . lacosamide (VIMPAT) tablet 100 mg  100 mg Per Tube BID Timothy S Opyd, MD   100 mg at 01/07/16 2132  . levETIRAcetam (KEPPRA) 100 MG/ML solution 1,500 mg  1,500 mg Per Tube BID Timothy S Opyd, MD   1,500 mg at 01/07/16 2132  . lisinopril (PRINIVIL,ZESTRIL) tablet 10 mg  10 mg Per Tube Daily Jeffrey T McClung, MD   10 mg at 01/07/16 1525  . metoprolol (LOPRESSOR) injection 10 mg  10 mg Intravenous Q6H PRN Jeffrey T McClung, MD      . metoprolol (LOPRESSOR) tablet 50 mg  50 mg Per Tube BID Jeffrey T McClung, MD   50 mg at 01/07/16 2131  . multivitamin with minerals tablet 1 tablet  1 tablet Per Tube Q breakfast Timothy S Opyd, MD   1 tablet at 01/07/16 0957  . ondansetron (ZOFRAN) tablet 4 mg  4 mg Per Tube Q6H PRN Jeffrey T McClung, MD       Or  . ondansetron (ZOFRAN) injection 4 mg  4 mg Intravenous Q6H PRN Jeffrey T McClung, MD      . polyethylene  glycol (MIRALAX / GLYCOLAX) packet 17 g  17 g Per Tube Daily Jeffrey T McClung, MD   17 g at 01/07/16 0958  . polyvinyl alcohol (LIQUIFILM TEARS) 1.4 % ophthalmic solution 2 drop  2 drop Both Eyes TID Timothy S Opyd, MD   2 drop at 01/07/16 2132  . ranitidine (ZANTAC) 150 MG/10ML syrup 150 mg  150 mg Per Tube BID Jeffrey T McClung, MD   150 mg at 01/07/16 2132  .   sodium chloride flush (NS) 0.9 % injection 10-40 mL  10-40 mL Intracatheter Q12H Jeffrey T McClung, MD   10 mL at 01/07/16 0959  . sodium chloride flush (NS) 0.9 % injection 10-40 mL  10-40 mL Intracatheter PRN Jeffrey T McClung, MD      . traZODone (DESYREL) tablet 25 mg  25 mg Per Tube QHS PRN Jeffrey T McClung, MD         Discharge Medications: Please see discharge summary for a list of discharge medications.  Relevant Imaging Results:  Relevant Lab Results:   Additional Information    Banjamin Stovall N, LCSW     

## 2016-01-08 NOTE — Progress Notes (Signed)
[place] TEAM 1 - Stepdown/ICU TEAM PROGRESS NOTE  [name] ZOX:[id number] DOB: [birth year] DOA: 01/03/2016 PCP: Ruthe Mannan, MD  Admit HPI / Brief Narrative: 50 y.o. 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 Patient is chronically ill and was admitted to this institution from 12/03/2015 - 12/08/2015 with sepsis secondary to UTI with ESBL-producing Klebsiella bacteremia. There was also suspicion for bacterial infection of a stage IV decubitus sacral ulcer at that time. Patient was ultimately discharged back to her SNF. The day of this admit a SNF worker noted 10-15 mL dark red blood per rectum and EMS was activated for transport to the hospital. There were no other concerns or notable changes identified by the SNF personnel. Patient is nonverbal at baseline and was unable to contribute to history.  In ED, patient was found tobe afebrile, saturating well on room air, with tachycardia to the 120s and blood pressure in the 80s systolic. Chest x-ray was obtained and negative for acute cardiopulmonary disease. Initial blood work was notable for sodium of 153, BUN of 42, leukocytosis to 11,100, and hemoglobin of 8.4. Lactic acid was elevated at 2.06. Urine was notable for many bacteria, moderate leukocytes, and negative nitrite. Blood and urine cultures were obtained, empiric vancomycin and Zosyn was administered, and a 30 cc/kg NS bolus was given. Patient's heart rate improved down to the low 100s but blood pressure remained in the 90/60 range.   HPI/Subjective: Non-verbal as per baseline.  No evidence of acute distress.  Hgb dropped today, but no evidence of acute blood loss (having brown liquid stool).  Assessment/Plan:  Sepsis - Proteus mirabilis UTI -cont per tube ceftin to complete 7 full days of tx  - afebrile - WBC normal   Coag negative Staph in 1 of 2 blood cx -most c/w contaminant  Sinus tachycardia -this was initially felt to be due to sepsis, but it persisted despite resolution of sepsis clinically - perhaps this is simply BB withdrawal - resumed metoprolol - TSH normal - d-dimer elevated but CTa chest w/o evidence of PE  Low grade GI bleed - Normocytic anemia  - SNF personnel describe 10-15 mL dark red blood per rectum - FOBT positive in ED  - hx of lower GIB with flex sig on 10/25/15 demonstrating rectosigmoid colon ulcers that were attributed to ischemia from constipation  - baseline Hgb ~8.4 - Hgb decreased w/ volume resuscitation, stabilized at ~8.0, then dropped to 7.4 again today - no clear ongoing blood loss - recheck CBC in AM  Sacral decubitus ulcer  - Stage IV, previously managed with wound vac  - Presented with wound vac dressings in place, but no wound vac  - WOC evaluated and is addressing care - VAC in use - foley required to keep wound dry to allow VAC to adhere appropriately - plan to keep foley in place at time of d/c - wound inspected today (no purulent d/c)  Essential hypertension - BP reasonably controlled at this time   Seizure disorder  - Reportedly stable on Vimpat and Keppra - continue  - No suggestion of active seizure activity   Hypernatremia  - most c/w hypovolemic cause - resolved w/ simple volume expansion / free water via PEG   Code Status: FULL Family Communication: no family present at time of exam Disposition Plan: SDU -  delay d/c to SNF due to Hgb drop - possible d/c 3/6  Consultants: none  Procedures: none  Antibiotics: Meropenem 2/28 > 3/2 Vanc 2/28 > 3/2 Cefazolin 3/2 > 3/3 Ceftin 3/3 >  DVT prophylaxis: SCDs + lovenox   Objective: Blood pressure 139/102, pulse 95, temperature 97.6 F (36.4 C), temperature source Oral, resp. rate 16, height 5\' 4"  (1.626 m), weight 83.5 kg (184 lb 1.4 oz), SpO2 100  %.  Intake/Output Summary (Last 24 hours) at 01/08/16 1628 Last data filed at 01/08/16 1037  Gross per 24 hour  Intake   2205 ml  Output   1225 ml  Net    980 ml   Exam: General: No acute respiratory distress  Lungs: Clear to auscultation bilaterally   Cardiovascular: regular RR - no M Abdomen: Nondistended, soft, bowel sounds positive Extremities: no significant cyanosis, clubbing, edema bilateral lower extremities  Data Reviewed:  Basic Metabolic Panel:  Recent Labs Lab 01/04/16 0929 01/05/16 0430 01/06/16 0628 01/07/16 0537 01/08/16 0808  NA 152* 146* 138 135 132*  K 4.7 3.6 3.5 3.4* 3.9  CL 126* 119* 108 106 102  CO2 18* 20* 19* 21* 21*  GLUCOSE 76 96 117* 123* 109*  BUN 27* 17 14 13 17   CREATININE 0.61 0.56 0.54 0.56 0.43*  CALCIUM 8.7* 8.7* 8.3* 8.4* 8.1*    CBC:  Recent Labs Lab 01/03/16 1936 01/04/16 0107 01/04/16 0432 01/06/16 0628 01/07/16 1033 01/08/16 0808  WBC 11.1*  --  11.6* 10.0 8.1 6.0  NEUTROABS 7.8*  --  9.2* 7.3  --   --   HGB 8.4* 7.8* 7.7* 8.0* 8.6* 7.4*  HCT 27.6* 26.1* 25.9* 24.5* 26.9* 23.5*  MCV 92.9  --  92.2 87.2 85.7 85.8  PLT 481*  --  430* 387 373 360    Liver Function Tests:  Recent Labs Lab 01/03/16 1936 01/05/16 0430 01/07/16 0537 01/08/16 0808  AST 17 14* 18 21  ALT 19 16 11* 11*  ALKPHOS 67 77 63 64  BILITOT 0.3 0.4 0.1* 0.4  PROT 7.0 5.7* 5.7* 5.7*  ALBUMIN 2.2* 1.8* 1.9* 1.8*    Coags:  Recent Labs Lab 01/04/16 0432  INR 1.24    Recent Labs Lab 01/04/16 0432  APTT 28    CBG:  Recent Labs Lab 01/07/16 1918 01/07/16 2330 01/08/16 0307 01/08/16 0826 01/08/16 1156  GLUCAP 101* 112* 123* 93 119*    Recent Results (from the past 240 hour(s))  Urine culture     Status: None   Collection Time: 01/03/16  6:22 PM  Result Value Ref Range Status   Specimen Description URINE, CATHETERIZED  Final   Special Requests NONE  Final   Culture >=100,000 COLONIES/mL PROTEUS MIRABILIS  Final    Report Status 01/05/2016 FINAL  Final   Organism ID, Bacteria PROTEUS MIRABILIS  Final      Susceptibility   Proteus mirabilis - MIC*    AMPICILLIN <=2 SENSITIVE Sensitive     CEFAZOLIN 8 SENSITIVE Sensitive     CEFTRIAXONE <=1 SENSITIVE Sensitive     CIPROFLOXACIN >=4 RESISTANT Resistant     GENTAMICIN <=1 SENSITIVE Sensitive     IMIPENEM 4 SENSITIVE Sensitive     NITROFURANTOIN 128 RESISTANT Resistant     TRIMETH/SULFA <=20 SENSITIVE Sensitive     AMPICILLIN/SULBACTAM <=2 SENSITIVE Sensitive     PIP/TAZO <=4 SENSITIVE Sensitive     * >=100,000 COLONIES/mL PROTEUS MIRABILIS  Blood Culture (routine x 2)     Status: None  Collection Time: 01/03/16  7:00 PM  Result Value Ref Range Status   Specimen Description BLOOD RIGHT HAND  Final   Special Requests BOTTLES DRAWN AEROBIC ONLY 6CC  Final   Culture  Setup Time   Final    GRAM POSITIVE COCCI IN CLUSTERS AEROBIC BOTTLE ONLY CRITICAL RESULT CALLED TO, READ BACK BY AND VERIFIED WITH: Cindee Lame RN 13:45 01/04/16 ([email or web address])    Culture   Final    STAPHYLOCOCCUS SPECIES (COAGULASE NEGATIVE) THE SIGNIFICANCE OF ISOLATING THIS ORGANISM FROM A SINGLE SET OF BLOOD CULTURES WHEN MULTIPLE SETS ARE DRAWN IS UNCERTAIN. PLEASE NOTIFY THE MICROBIOLOGY DEPARTMENT WITHIN ONE WEEK IF SPECIATION AND SENSITIVITIES ARE REQUIRED.    Report Status 01/07/2016 FINAL  Final  Blood Culture (routine x 2)     Status: None   Collection Time: 01/03/16  7:05 PM  Result Value Ref Range Status   Specimen Description BLOOD RIGHT ANTECUBITAL  Final   Special Requests   Final    BOTTLES DRAWN AEROBIC AND ANAEROBIC BLUE 10CC RED 5CC   Culture NO GROWTH 5 DAYS  Final   Report Status 01/08/2016 FINAL  Final  MRSA PCR Screening     Status: None   Collection Time: 01/04/16  2:45 AM  Result Value Ref Range Status   MRSA by PCR NEGATIVE NEGATIVE Final    Comment:        The GeneXpert MRSA Assay (FDA approved for NASAL specimens only), is one component of a comprehensive  MRSA colonization surveillance program. It is not intended to diagnose MRSA infection nor to guide or monitor treatment for MRSA infections.      Studies:   Recent x-ray studies have been reviewed in detail by the Attending Physician  Scheduled Meds:  Scheduled Meds: . antiseptic oral rinse  7 mL Mouth Rinse q12n4p  . cefUROXime  500 mg Per Tube Q12H  . chlorhexidine  15 mL Mouth Rinse BID  . enoxaparin (LOVENOX) injection  40 mg Subcutaneous Q24H  . feeding supplement (PRO-STAT SUGAR FREE 64)  60 mL Per Tube BID  . ferrous sulfate  300 mg Per Tube BID WC  . free water  100 mL Oral 3 times per day  . lacosamide  100 mg Per Tube BID  . levETIRAcetam  1,500 mg Per Tube BID  . lisinopril  10 mg Per Tube Daily  . metoprolol  50 mg Per Tube BID  . multivitamin with minerals  1 tablet Per Tube Q breakfast  . polyethylene glycol  17 g Per Tube Daily  . polyvinyl alcohol  2 drop Both Eyes TID  . ranitidine  150 mg Per Tube BID  . sodium chloride flush  10-40 mL Intracatheter Q12H    Time spent on care of this patient: 25 mins   MCCLUNG,JEFFREY T , MD   [facility] Office  [phone] Pager - Text Page per Loretha Stapler as per below:  On-Call/Text Page:      [email or web address]      password [email or web address]  If 7PM-7AM, please contact night-coverage [email or web address] Password [email or web address] 01/08/2016, 4:28 PM   LOS: 5 days

## 2016-01-08 NOTE — [place] FL2 (Signed)
[place] MEDICAID FL2 LEVEL OF CARE SCREENING TOOL     IDENTIFICATION  Patient Name: Donald PoreLaura Marie Dresser Birthdate: 05/01/1966 Sex: female Admission Date (Current Location): 01/03/2016  [facility] [place]   Facility and Address:  [facility], [address], [place], [place] [address]      Provider Number: [id number]  Attending Physician Name and Address:  Lonia BloodJeffrey T McClung, MD  Relative Name and Phone Number:       Current Level of Care: Hospital Recommended Level of Care: Skilled Nursing Facility Prior Approval Number:    Date Approved/Denied:   PASRR Number:    Discharge Plan: SNF    Current Diagnoses: Patient Active Problem List   Diagnosis Date Noted  . Gastrointestinal hemorrhage associated with acute gastritis   . Sacral decubitus ulcer   . Essential hypertension   . Seizure disorder (HCC)   . SIRS (systemic inflammatory response syndrome) (HCC) 01/03/2016  . Sepsis (HCC) 01/03/2016  . GI bleed 01/03/2016  . Bacteremia due to Klebsiella pneumoniae 12/06/2015  . Sepsis secondary to UTI (HCC) 12/05/2015  . UTI (urinary tract infection) due to urinary indwelling Foley catheter (HCC) 12/05/2015  . Leukocytosis 12/05/2015  . FTT (failure to thrive) in adult 12/05/2015  . Benign essential HTN 12/05/2015  . Hypokalemia 12/05/2015  . Normocytic anemia 12/04/2015  . History of stroke with current residual effects 12/04/2015  . S/P percutaneous endoscopic gastrostomy (PEG) tube placement (HCC) 12/04/2015  . Palliative care encounter 08/29/2015  . Decubitus ulcer of sacral region, stage 4 (HCC) 08/28/2015  . Hx of ischemic right ACA stroke 05/06/2015  . History of ischemic left ACA stroke 05/06/2015  . Dysphagia   . Hypernatremia 03/16/2015  . Seizures (HCC) 07/05/2014    Orientation RESPIRATION BLADDER Height & Weight     Self  O2 (5L) Incontinent (foley to go with patient at DC) Weight: 184 lb 1.4 oz (83.5  kg) Height:  5\' 4"  (162.6 cm)  BEHAVIORAL SYMPTOMS/MOOD NEUROLOGICAL BOWEL NUTRITION STATUS  Other (Comment) (None noted/no behaviors) Convulsions/Seizures (hx, but no change in care plans) Incontinent Feeding tube (Continue 60 ml Prostat liquid protein BID via PEG tube, providing an additional 400 kcal and 60 grams of protein.)  AMBULATORY STATUS COMMUNICATION OF NEEDS Skin   Extensive Assist Non-Verbally Other (Comment), PU Stage and Appropriate Care, Wound Vac (Pt has been using a negative pressure device to the sacrum wound prior to admission.)       PU Stage 4 Dressing: TID               Personal Care Assistance Level of Assistance              Functional Limitations Info  Sight, Hearing, Speech Sight Info: Adequate Hearing Info: Adequate Speech Info: Impaired    SPECIAL CARE FACTORS FREQUENCY  Speech therapy             Speech Therapy Frequency: daily for diet      Contractures Contractures Info: Not present    Additional Factors Info  Isolation Precautions, Code Status, Allergies, Psychotropic, Insulin Sliding Scale Code Status Info: Full Code Allergies Info: Amlodipine Psychotropic Info: None listed Insulin Sliding Scale Info: None noted Isolation Precautions Info: On contact percautions  MRSA     Current Medications (01/08/2016):  This is the current hospital active medication list Current Facility-Administered Medications  Medication Dose Route Frequency Provider Last Rate Last Dose  . 0.45 % sodium chloride infusion   Intravenous  Continuous Lonia Blood, MD 10 mL/hr at 01/07/16 2000    . acetaminophen (TYLENOL) tablet 650 mg  650 mg Per Tube Q6H PRN Lonia Blood, MD   650 mg at 01/07/16 2131   Or  . acetaminophen (TYLENOL) suppository 650 mg  650 mg Rectal Q6H PRN Lonia Blood, MD      . antiseptic oral rinse (CPC / CETYLPYRIDINIUM CHLORIDE 0.05%) solution 7 mL  7 mL Mouth Rinse q12n4p Lonia Blood, MD   7 mL at 01/07/16 1526  .  bisacodyl (DULCOLAX) suppository 10 mg  10 mg Rectal Daily PRN Briscoe Deutscher, MD      . cefUROXime (CEFTIN) 250 MG/5ML suspension 500 mg  500 mg Per Tube Q12H Lonia Blood, MD   500 mg at 01/07/16 2130  . chlorhexidine (PERIDEX) 0.12 % solution 15 mL  15 mL Mouth Rinse BID Lonia Blood, MD   15 mL at 01/07/16 2132  . enoxaparin (LOVENOX) injection 40 mg  40 mg Subcutaneous Q24H Lonia Blood, MD   40 mg at 01/07/16 1525  . feeding supplement (JEVITY 1.5 CAL/FIBER) liquid 1,000 mL  1,000 mL Per Tube Continuous Lonia Blood, MD 55 mL/hr at 01/06/16 2244 1,000 mL at 01/06/16 2244  . feeding supplement (PRO-STAT SUGAR FREE 64) liquid 60 mL  60 mL Per Tube BID Briscoe Deutscher, MD   60 mL at 01/07/16 2132  . ferrous sulfate 300 (60 Fe) MG/5ML syrup 300 mg  300 mg Per Tube BID WC Briscoe Deutscher, MD   300 mg at 01/07/16 1835  . free water 100 mL  100 mL Oral 3 times per day Lonia Blood, MD   100 mL at 01/08/16 0600  . HYDROmorphone (DILAUDID) injection 0.5 mg  0.5 mg Intravenous Q2H PRN Briscoe Deutscher, MD   0.5 mg at 01/07/16 1025  . lacosamide (VIMPAT) tablet 100 mg  100 mg Per Tube BID Briscoe Deutscher, MD   100 mg at 01/07/16 2132  . levETIRAcetam (KEPPRA) 100 MG/ML solution 1,500 mg  1,500 mg Per Tube BID Briscoe Deutscher, MD   1,500 mg at 01/07/16 2132  . lisinopril (PRINIVIL,ZESTRIL) tablet 10 mg  10 mg Per Tube Daily Lonia Blood, MD   10 mg at 01/07/16 1525  . metoprolol (LOPRESSOR) injection 10 mg  10 mg Intravenous Q6H PRN Lonia Blood, MD      . metoprolol (LOPRESSOR) tablet 50 mg  50 mg Per Tube BID Lonia Blood, MD   50 mg at 01/07/16 2131  . multivitamin with minerals tablet 1 tablet  1 tablet Per Tube Q breakfast Briscoe Deutscher, MD   1 tablet at 01/07/16 0957  . ondansetron (ZOFRAN) tablet 4 mg  4 mg Per Tube Q6H PRN Lonia Blood, MD       Or  . ondansetron [facility]) injection 4 mg  4 mg Intravenous Q6H PRN Lonia Blood, MD      . polyethylene  glycol (MIRALAX / GLYCOLAX) packet 17 g  17 g Per Tube Daily Lonia Blood, MD   17 g at 01/07/16 0958  . polyvinyl alcohol (LIQUIFILM TEARS) 1.4 % ophthalmic solution 2 drop  2 drop Both Eyes TID Briscoe Deutscher, MD   2 drop at 01/07/16 2132  . ranitidine (ZANTAC) 150 MG/10ML syrup 150 mg  150 mg Per Tube BID Lonia Blood, MD   150 mg at 01/07/16 2132  .  sodium chloride flush (NS) 0.9 % injection 10-40 mL  10-40 mL Intracatheter Q12H Lonia Blood, MD   10 mL at 01/07/16 0959  . sodium chloride flush (NS) 0.9 % injection 10-40 mL  10-40 mL Intracatheter PRN Lonia Blood, MD      . traZODone (DESYREL) tablet 25 mg  25 mg Per Tube QHS PRN Lonia Blood, MD         Discharge Medications: Please see discharge summary for a list of discharge medications.  Relevant Imaging Results:  Relevant Lab Results:   Additional Information    Raye Sorrow, LCSW

## 2016-01-09 ENCOUNTER — Encounter (HOSPITAL_COMMUNITY): Payer: Commercial Managed Care - HMO

## 2016-01-09 DIAGNOSIS — K625 Hemorrhage of anus and rectum: Secondary | ICD-10-CM

## 2016-01-09 LAB — CBC
HCT: 22.1 % — ABNORMAL LOW (ref 36.0–46.0)
HEMOGLOBIN: 7.1 g/dL — AB (ref 12.0–15.0)
MCH: 28.1 pg (ref 26.0–34.0)
MCHC: 32.1 g/dL (ref 30.0–36.0)
MCV: 87.4 fL (ref 78.0–100.0)
PLATELETS: 342 10*3/uL (ref 150–400)
RBC: 2.53 MIL/uL — AB (ref 3.87–5.11)
RDW: 17.3 % — ABNORMAL HIGH (ref 11.5–15.5)
WBC: 6.8 10*3/uL (ref 4.0–10.5)

## 2016-01-09 LAB — GLUCOSE, CAPILLARY
GLUCOSE-CAPILLARY: 102 mg/dL — AB (ref 65–99)
GLUCOSE-CAPILLARY: 103 mg/dL — AB (ref 65–99)
GLUCOSE-CAPILLARY: 111 mg/dL — AB (ref 65–99)
GLUCOSE-CAPILLARY: 109 mg/dL — AB (ref 65–99)
Glucose-Capillary: 113 mg/dL — ABNORMAL HIGH (ref 65–99)
Glucose-Capillary: 123 mg/dL — ABNORMAL HIGH (ref 65–99)
Glucose-Capillary: 129 mg/dL — ABNORMAL HIGH (ref 65–99)

## 2016-01-09 LAB — BASIC METABOLIC PANEL
Anion gap: 10 (ref 5–15)
BUN: 15 mg/dL (ref 6–20)
CHLORIDE: 103 mmol/L (ref 101–111)
CO2: 23 mmol/L (ref 22–32)
Calcium: 8.5 mg/dL — ABNORMAL LOW (ref 8.9–10.3)
Creatinine, Ser: 0.44 mg/dL (ref 0.44–1.00)
Glucose, Bld: 124 mg/dL — ABNORMAL HIGH (ref 65–99)
POTASSIUM: 4.1 mmol/L (ref 3.5–5.1)
SODIUM: 136 mmol/L (ref 135–145)

## 2016-01-09 NOTE — Consult Note (Signed)
WOC follow up: Bedside nurse states Vac dressing was changed yesterday related to leakage; this is also noted in the EMR flowsheet.  WOC will plan to change tomorrow. Cammie Mcgeeawn Hersel Mcmeen MSN, RN, CWOCN, [place]CN-AP, CNS [phone]

## 2016-01-09 NOTE — Progress Notes (Addendum)
[facility] TEAM 1 - Stepdown/ICU TEAM PROGRESS NOTE  [name] RUE:[id number] DOB: 07-Nov-1965 DOA: 01/03/2016 PCP: Ruthe Mannan, MD  Admit HPI / Brief Narrative: 50 y.o. 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 Patient is chronically ill and was admitted to this institution from 12/03/2015 - 12/08/2015 with sepsis secondary to UTI with ESBL-producing Klebsiella bacteremia. There was also suspicion for bacterial infection of a stage IV decubitus sacral ulcer at that time. Patient was ultimately discharged back to her SNF. The day of this admit a SNF worker noted 10-15 mL dark red blood per rectum and EMS was activated for transport to the hospital. There were no other concerns or notable changes identified by the SNF personnel. Patient is nonverbal at baseline and was unable to contribute to history.  In ED, patient was found tobe afebrile, saturating well on room air, with tachycardia to the 120s and blood pressure in the 80s systolic. Chest x-ray was obtained and negative for acute cardiopulmonary disease. Initial blood work was notable for sodium of 153, BUN of 42, leukocytosis to 11,100, and hemoglobin of 8.4. Lactic acid was elevated at 2.06. Urine was notable for many bacteria, moderate leukocytes, and negative nitrite. Blood and urine cultures were obtained, empiric vancomycin and Zosyn was administered, and a 30 cc/kg NS bolus was given. Patient's heart rate improved down to the low 100s but blood pressure remained in the 90/60 range.   HPI/Subjective: No evidence of distress.  Patient is nonverbal.  Assessment/Plan:  Sepsis - Proteus mirabilis UTI completes 7 full days of abx tx today - afebrile - WBC normal - follow   Coag negative Staph in 1 of 2 blood cx -most c/w contaminant  Sinus  tachycardia -this was initially felt to be due to sepsis, but it persisted despite resolution of sepsis clinically - perhaps this is simply BB withdrawal - resumed metoprolol - TSH normal - d-dimer elevated but CTa chest w/o evidence of PE  Elevated d-dimer May simply be nonspecific - CTa chest negative for PE - venous duplex B LE pending to r/o DVT   Low grade GI bleed - Normocytic anemia  - SNF personnel describe 10-15 mL dark red blood per rectum - FOBT positive in ED  - hx of lower GIB with flex sig on 10/25/15 demonstrating rectosigmoid colon ulcers that were attributed to ischemia from constipation  - baseline Hgb ~8.4 - Hgb decreased w/ volume resuscitation, stabilized at ~8.0, then dropped to 7.4 and again to 7.1 today - recheck CBC in AM - may have to consider GI eval to repeat flex sig to assess status of ulcers   Sacral decubitus ulcer  - Stage IV, previously managed with wound vac  - Presented with wound vac dressings in place, but no wound vac  - WOC evaluated and is addressing care - VAC in use - foley required to keep wound dry to allow VAC to adhere appropriately - plan to keep foley in place at time of d/c - wound inspected today (no purulent d/c)  Essential hypertension - BP reasonably controlled at this time   Seizure disorder  - Reportedly stable on Vimpat and Keppra - continue  - No suggestion of active seizure activity   Hypernatremia  - most c/w hypovolemic cause - resolved w/ simple volume expansion / free water via  PEG   Code Status: FULL Family Communication: no family present at time of exam Disposition Plan: SDU - delay d/c to SNF due to Hgb drop - f/u Hgb in AM - f/u venous duplex   Consultants: none  Procedures: none  Antibiotics: Meropenem 2/28 > 3/2 Vanc 2/28 > 3/2 Cefazolin 3/2 > 3/3 Ceftin 3/3 > 3/6  DVT prophylaxis: SCDs + lovenox   Objective: Blood pressure 123/76, pulse 114, temperature 99.8 F (37.7 C), temperature  source Axillary, resp. rate 16, height  (1.626 m), weight 83.7 kg (184 lb 8.4 oz), SpO2 98 %.  Intake/Output Summary (Last 24 hours) at 01/09/16 1058 Last data filed at 01/09/16 0900  Gross per 24 hour  Intake   1535 ml  Output   1100 ml  Net    435 ml   Exam: General: No acute respiratory distress - appears comfortable   Lungs: Clear to auscultation bilaterally - no wheeze  Cardiovascular: mild tachycardia at 105bpm Abdomen: Nondistended, soft, bowel sounds positive Extremities: no significant cyanosis, clubbing, or edema bilateral lower extremities  Data Reviewed:  Basic Metabolic Panel:  Recent Labs Lab 01/05/16 0430 01/06/16 0628 01/07/16 0537 01/08/16 0808 01/09/16 0904  NA 146* 138 135 132* 136  K 3.6 3.5 3.4* 3.9 4.1  CL 119* 108 106 102 103  CO2 20* 19* 21* 21* 23  GLUCOSE 96 117* 123* 109* 124*  BUN CREATININE 0.56 0.54 0.56 0.43* 0.44  CALCIUM 8.7* 8.3* 8.4* 8.1* 8.5*    CBC:  Recent Labs Lab 01/03/16 1936  01/04/16 0432 01/06/16 0628 01/07/16 1033 01/08/16 0808 01/09/16 0904  WBC 11.1*  --  11.6* 10.0 8.1 6.0 6.8  NEUTROABS 7.8*  --  9.2* 7.3  --   --   --   HGB 8.4*  < > 7.7* 8.0* 8.6* 7.4* 7.1*  HCT 27.6*  < > 25.9* 24.5* 26.9* 23.5* 22.1*  MCV 92.9  --  92.2 87.2 85.7 85.8 87.4  PLT 481*  --  430* 387 373 360 342  < > = values in this interval not displayed.  Liver Function Tests:  Recent Labs Lab 01/03/16 1936 01/05/16 0430 01/07/16 0537 01/08/16 0808  AST 17 14* 18 21  ALT 19 16 11* 11*  ALKPHOS 67 77 63 64  BILITOT 0.3 0.4 0.1* 0.4  PROT 7.0 5.7* 5.7* 5.7*  ALBUMIN 2.2* 1.8* 1.9* 1.8*    Coags:  Recent Labs Lab 01/04/16 0432  INR 1.24    Recent Labs Lab 01/04/16 0432  APTT 28    CBG:  Recent Labs Lab 01/08/16 1533 01/08/16 1954 01/08/16 2352 01/09/16 0612 01/09/16 0842  GLUCAP 95 117* 129* 102* 103*    Recent Results (from the past 240 hour(s))  Urine culture     Status: None    Collection Time: 01/03/16  6:22 PM  Result Value Ref Range Status   Specimen Description URINE, CATHETERIZED  Final   Special Requests NONE  Final   Culture >=100,000 COLONIES/mL PROTEUS MIRABILIS  Final   Report Status 01/05/2016 FINAL  Final   Organism ID, Bacteria PROTEUS MIRABILIS  Final      Susceptibility   Proteus mirabilis - MIC*    AMPICILLIN <=2 SENSITIVE Sensitive     CEFAZOLIN 8 SENSITIVE Sensitive     CEFTRIAXONE <=1 SENSITIVE Sensitive     CIPROFLOXACIN >=4 RESISTANT Resistant     GENTAMICIN <=1 SENSITIVE Sensitive     IMIPENEM 4 SENSITIVE Sensitive  NITROFURANTOIN 128 RESISTANT Resistant     TRIMETH/SULFA <=20 SENSITIVE Sensitive     AMPICILLIN/SULBACTAM <=2 SENSITIVE Sensitive     PIP/TAZO <=4 SENSITIVE Sensitive     * >=100,000 COLONIES/mL PROTEUS MIRABILIS  Blood Culture (routine x 2)     Status: None   Collection Time: 01/03/16  7:00 PM  Result Value Ref Range Status   Specimen Description BLOOD RIGHT HAND  Final   Special Requests BOTTLES DRAWN AEROBIC ONLY 6CC  Final   Culture  Setup Time   Final    GRAM POSITIVE COCCI IN CLUSTERS AEROBIC BOTTLE ONLY CRITICAL RESULT CALLED TO, READ BACK BY AND VERIFIED WITH: Cindee Lame. Davis RN 13:45 01/04/16 ([email or web address])    Culture   Final    STAPHYLOCOCCUS SPECIES (COAGULASE NEGATIVE) THE SIGNIFICANCE OF ISOLATING THIS ORGANISM FROM A SINGLE SET OF BLOOD CULTURES WHEN MULTIPLE SETS ARE DRAWN IS UNCERTAIN. PLEASE NOTIFY THE MICROBIOLOGY DEPARTMENT WITHIN ONE WEEK IF SPECIATION AND SENSITIVITIES ARE REQUIRED.    Report Status 01/07/2016 FINAL  Final  Blood Culture (routine x 2)     Status: None   Collection Time: 01/03/16  7:05 PM  Result Value Ref Range Status   Specimen Description BLOOD RIGHT ANTECUBITAL  Final   Special Requests   Final    BOTTLES DRAWN AEROBIC AND ANAEROBIC BLUE 10CC RED 5CC   Culture NO GROWTH 5 DAYS  Final   Report Status 01/08/2016 FINAL  Final  MRSA PCR Screening     Status: None   Collection Time:  01/04/16  2:45 AM  Result Value Ref Range Status   MRSA by PCR NEGATIVE NEGATIVE Final    Comment:        The GeneXpert MRSA Assay (FDA approved for NASAL specimens only), is one component of a comprehensive MRSA colonization surveillance program. It is not intended to diagnose MRSA infection nor to guide or monitor treatment for MRSA infections.      Studies:   Recent x-ray studies have been reviewed in detail by the Attending Physician  Scheduled Meds:  Scheduled Meds: . antiseptic oral rinse  7 mL Mouth Rinse q12n4p  . cefUROXime  500 mg Per Tube Q12H  . chlorhexidine  15 mL Mouth Rinse BID  . enoxaparin (LOVENOX) injection  40 mg Subcutaneous Q24H  . feeding supplement (PRO-STAT SUGAR FREE 64)  60 mL Per Tube BID  . ferrous sulfate  300 mg Per Tube BID WC  . free water  100 mL Oral 3 times per day  . lacosamide  100 mg Per Tube BID  . levETIRAcetam  1,500 mg Per Tube BID  . lisinopril  10 mg Per Tube Daily  . metoprolol  50 mg Per Tube BID  . multivitamin with minerals  1 tablet Per Tube Q breakfast  . polyethylene glycol  17 g Per Tube Daily  . polyvinyl alcohol  2 drop Both Eyes TID  . ranitidine  150 mg Per Tube BID    Time spent on care of this patient: 25 mins   [facility] Regional Medical CenterMCCLUNG,Deaysia Grigoryan T , MD   [facility] Office  [phone] Pager - Text Page per Loretha StaplerAmion as per below:  On-Call/Text Page:      [email or web address]      password [email or web address]  If 7PM-7AM, please contact night-coverage [email or web address] Password [email or web address] 01/09/2016, 10:58 AM   LOS: 6 days

## 2016-01-09 NOTE — Progress Notes (Signed)
CSW spoke with Facility Representative [name] to inform of anticipated discharge on today. Per [name], patient can return to facility at anytime. Discharge Summary to be sent via [employer] once available. CSW will then arrange transportation via [facility]. Med Necessity form to be completed and placed in patient's chart.  CSW will continue to follow and provide support to patient while in hospital.   [name], [facility] LLCCSWA Clinical Social Worker [facility] [place] Ph: [phone]

## 2016-01-09 NOTE — Care Management Note (Signed)
Case Management Note  Patient Details  Name: Donald PoreLaura Marie Hufstedler MRN: [id number] Date of Birth: 07/19/1966  Subjective/Objective:   Patient had signf hgb drop, will recheck hgb in am, plan is for snf when stable.                 Action/Plan:   Expected Discharge Date:                  Expected Discharge Plan:  Skilled Nursing Facility  In-House Referral:  Clinical Social Work  Discharge planning Services  CM Consult  Post Acute Care Choice:    Choice offered to:     DME Arranged:    DME Agency:     HH Arranged:    HH Agency:     Status of Service:  Completed, signed off  Medicare Important Message Given:    Date Medicare IM Given:    Medicare IM give by:    Date Additional Medicare IM Given:    Additional Medicare Important Message give by:     If discussed at Long Length of Stay Meetings, dates discussed:    Additional Comments:  [name], Aili Casillas [place], RN 01/09/2016, 5:32 PM

## 2016-01-10 ENCOUNTER — Inpatient Hospital Stay (HOSPITAL_COMMUNITY): Payer: Medicaid Other

## 2016-01-10 DIAGNOSIS — R791 Abnormal coagulation profile: Secondary | ICD-10-CM

## 2016-01-10 DIAGNOSIS — D649 Anemia, unspecified: Secondary | ICD-10-CM

## 2016-01-10 DIAGNOSIS — R7989 Other specified abnormal findings of blood chemistry: Secondary | ICD-10-CM

## 2016-01-10 DIAGNOSIS — L89154 Pressure ulcer of sacral region, stage 4: Secondary | ICD-10-CM

## 2016-01-10 DIAGNOSIS — I82403 Acute embolism and thrombosis of unspecified deep veins of lower extremity, bilateral: Secondary | ICD-10-CM

## 2016-01-10 LAB — COMPREHENSIVE METABOLIC PANEL
ALBUMIN: 2.1 g/dL — AB (ref 3.5–5.0)
ALK PHOS: 70 U/L (ref 38–126)
ALT: 13 U/L — AB (ref 14–54)
AST: 16 U/L (ref 15–41)
Anion gap: 9 (ref 5–15)
BUN: 14 mg/dL (ref 6–20)
CALCIUM: 8.6 mg/dL — AB (ref 8.9–10.3)
CHLORIDE: 104 mmol/L (ref 101–111)
CO2: 22 mmol/L (ref 22–32)
CREATININE: 0.44 mg/dL (ref 0.44–1.00)
GFR calc non Af Amer: >60 mL/min (ref >60)
GLUCOSE: 131 mg/dL — AB (ref 65–99)
Potassium: 4 mmol/L (ref 3.5–5.1)
SODIUM: 135 mmol/L (ref 135–145)
Total Bilirubin: 0.2 mg/dL — ABNORMAL LOW (ref 0.3–1.2)
Total Protein: 6.4 g/dL — ABNORMAL LOW (ref 6.5–8.1)

## 2016-01-10 LAB — CBC
HCT: 27.1 % — ABNORMAL LOW (ref 36.0–46.0)
HEMOGLOBIN: 8.8 g/dL — AB (ref 12.0–15.0)
MCH: 28.4 pg (ref 26.0–34.0)
MCHC: 32.5 g/dL (ref 30.0–36.0)
MCV: 87.4 fL (ref 78.0–100.0)
PLATELETS: 365 10*3/uL (ref 150–400)
RBC: 3.1 MIL/uL — AB (ref 3.87–5.11)
RDW: 17.5 % — ABNORMAL HIGH (ref 11.5–15.5)
WBC: 6.1 10*3/uL (ref 4.0–10.5)

## 2016-01-10 LAB — GLUCOSE, CAPILLARY
GLUCOSE-CAPILLARY: 121 mg/dL — AB (ref 65–99)
GLUCOSE-CAPILLARY: 113 mg/dL — AB (ref 65–99)
GLUCOSE-CAPILLARY: 106 mg/dL — AB (ref 65–99)
Glucose-Capillary: 114 mg/dL — ABNORMAL HIGH (ref 65–99)
Glucose-Capillary: 111 mg/dL — ABNORMAL HIGH (ref 65–99)

## 2016-01-10 LAB — PROTIME-INR
INR: 1.17 (ref 0.00–1.49)
Prothrombin Time: 15.1 seconds (ref 11.6–15.2)

## 2016-01-10 MED ORDER — METOPROLOL TARTRATE 50 MG PO TABS
75.0000 mg | ORAL_TABLET | Freq: Two times a day (BID) | ORAL | Status: DC
  Administered 2016-01-10 – 2016-01-11 (×2): 75 mg
  Filled 2016-01-10 (×2): qty 1

## 2016-01-10 NOTE — Progress Notes (Signed)
Nutrition Follow-up  DOCUMENTATION CODES:   Obesity unspecified  INTERVENTION:    Continue Jevity 1.5 at 55 ml/h with Prostat 60 ml BID to provide 2380 kcals, 144 gm protein, 1003 ml free water daily.  NUTRITION DIAGNOSIS:   Increased nutrient needs related to wound healing as evidenced by estimated needs.  Ongoing  GOAL:   Patient will meet greater than or equal to 90% of their needs  Met  MONITOR:   Skin, TF tolerance, Weight trends, I & O's, Labs  ASSESSMENT:   50 yo with PMH of chronic anemia, chronic constipation, and ruptured cerebral aneurysm with subsequent seizure disorder with nonverbal state and PEG tube dependence who presents from her SNF with reports of dark red blood per rectum. Pt is chronically ill and was admitted to [facility] from 1/28 - 2/2 with sepsis secondary to UTI with ESBL-producing Klebsiella bacteremia. There was also suspicion for bacterial infection of a stage IV decubitus sacral ulcer at that time.  Patient is nonverbal at baseline and unable to contribute to history.  Discussed patient with RN today. Patient is currently receiving Jevity 1.5 via PEG at 55 ml/h (1320 ml/day) with Prostat 60 ml BID to provide 2380 kcals, 144 gm protein, 1003 ml free water daily. Also receiving 100 ml free water flushes TID. Tolerating well per discussion with RN.   VAC in place to chronic stage 4 sacral pressure injury. Merrill RN following.  Plans for d/c to SNF when Hgb stabilizes.  Diet Order:  Diet NPO time specified  Skin:  Wound (see comment) (Stage 4 PU on Sacrum)  Last BM:  3/7  Height:   Ht Readings from Last 1 Encounters:  01/06/16 _0  (1.626 m)    Weight:   Wt Readings from Last 1 Encounters:  01/10/16 179 lb 0.2 oz (81.2 kg)    Ideal Body Weight:  54.5 kg  BMI:  Body mass index is 30.71 kg/(m^2).  Estimated Nutritional Needs:   Kcal:  2200-2400  Protein:  120-130 grams  Fluid:  >/= 2L  EDUCATION NEEDS:   No education needs identified  at this time  Molli Barrows, [place], [place], [place] Pager [phone] After Hours Pager [phone]

## 2016-01-10 NOTE — Care Management Important Message (Signed)
Important Message  Patient Details  Name: Meghan PoreLaura Marie Lawson MRN: [id number] Date of Birth: 02/13/1966   Medicare Important Message Given:  Yes    [name], Shameek Nyquist [place], RN 01/10/2016, 3:58 PMImportant Message  Patient Details  Name: Meghan PoreLaura Marie Lawson MRN: [id number] Date of Birth: 02/22/1966   Medicare Important Message Given:  Yes    [name], Jeslyn Amsler [place], RN 01/10/2016, 3:58 PM

## 2016-01-10 NOTE — Progress Notes (Signed)
VASCULAR LAB PRELIMINARY  PRELIMINARY  PRELIMINARY  PRELIMINARY  Bilateral lower extremity venous duplex  completed.    Preliminary report:  Bilateral:  No evidence of DVT, superficial thrombosis, or Baker's Cyst.  Left side technically difficult due to patient position and guarding.    Adalea Handler, RVT 01/10/2016, 10:52 AM

## 2016-01-10 NOTE — Consult Note (Signed)
WOC wound consult note Reason for Consult: Pt has been using a negative pressure device to the sacrum wound prior to admission. Wound appearance has not changed since previous assessment, beefy red with small amt red drainage in the cannister. 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 Another piece bridged to hip to reduce pressure from the track pad. Pt tolerated without discomfort. Plan for dressing change Friday. No family members at bedside to discuss plan of care. Cammie Mcgeeawn Yamilet Mcfayden MSN, RN, CWOCN, ViloniaWCN-AP, CNS [phone]

## 2016-01-10 NOTE — Discharge Summary (Addendum)
Physician Discharge Summary  [name] ZOX:[id number] DOB: [birth year] DOA: 01/03/2016  PCP: Ruthe Mannan, MD  Admit date: 01/03/2016 Discharge date: 01/11/2016  Time spent: 35 minutes  Recommendations for Outpatient Follow-up:  -assure pt remains adequately hydrated -follow patient's sacral decub -follow patient's Hgb   Discharge Diagnoses:  Sepsis - Proteus mirabilis UTI Coag negative Staph in 1 of 2 blood cx Sinus tachycardia Elevated d-dimer Low grade GI bleed - Normocytic anemia  Sacral decubitus ulcer  Essential hypertension Seizure disorder  Hypernatremia    Discharge Condition: Stable  Diet recommendation: Jevity 1.5 CAL/fiber at 62ml/hr, pro-stat sugar free 64  60 mL BID  Filed Weights   01/09/16 0500 01/10/16 0500 01/11/16 0500  Weight: 83.7 kg (184 lb 8.4 oz) 81.2 kg (179 lb 0.2 oz) 80.1 kg (176 lb 9.4 oz)    History of present illness:  50 y.o. 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 Patient is chronically ill and was admitted to this institution from 12/03/2015 - 12/08/2015 with sepsis secondary to UTI with ESBL-producing Klebsiella bacteremia. There was also suspicion for bacterial infection of a stage IV decubitus sacral ulcer at that time. Patient was ultimately discharged back to her SNF. The day of her admit a SNF worker noted 10-15 mL dark red blood per rectum and EMS was activated for transport to the hospital. There were no other concerns or notable changes identified by the SNF personnel. Patient is nonverbal at baseline and was unable to contribute to history.  In ED, patient was found to be afebrile, saturating well on room air, with tachycardia to the 120s and blood pressure in the 80s systolic. Chest x-ray was obtained and negative for acute cardiopulmonary  disease. Initial blood work was notable for sodium of 153, BUN of 42, leukocytosis to 11,100, and hemoglobin of 8.4. Lactic acid was elevated at 2.06. Urine was notable for many bacteria, moderate leukocytes, and negative nitrite. Blood and urine cultures were obtained, empiric vancomycin and Zosyn was administered, and a 30 cc/kg NS bolus was given. Patient's heart rate improved down to the low 100s but blood pressure remained in the 90/60 range.  During his hospitalization patient was treated for Sepsis, UTI positive Proteus Mirabilis (completed 7 day course antibiotics), sacral decubitus ulcer (patient will be discharged with wound VAC), and monitored for increasing GI bleed (H/H stable).  Hospital Course:  Sepsis - Proteus mirabilis UTI completed 7 full days of abx tx - afebrile - WBC normal   Coag negative Staph in 1 of 2 blood cx -most c/w contaminant  Sinus tachycardia -this was initially felt to be due to sepsis, but it persisted despite resolution of sepsis clinically - ?BB withdrawal - resumed metoprolol 75 mg BID - TSH normal  - d-dimer elevated but CTa chest w/o evidence of PE and dopplers neg for DVT  Elevated d-dimer May simply be nonspecific - CTa chest negative for PE - venous duplex B LE negative for DVT   Low grade GI bleed - Normocytic anemia  - SNF personnel describe 10-15 mL dark red blood per rectum - FOBT positive in ED  - hx of lower GIB with flex sig on 10/25/15 demonstrating rectosigmoid colon ulcers that were attributed to ischemia from constipation  - baseline Hgb ~8.4 - Patient at baseline w/o ongoing bleeding appreciable   Sacral decubitus  ulcer  - Stage IV, managed with wound vac  - Presented with wound vac dressings in place, but no wound vac  - Continue wound VAC, continue Foley   Essential hypertension - BP reasonably controlled at this time   Seizure disorder  - Reportedly stable on Vimpat and Keppra - continue  - No suggestion of  active seizure activity   Hypernatremia  - Resolved     Culture 2/28 positive Proteus Mirabilis 2/28 blood right hand positive coag negative staph most likely contaminant 2/28 blood right AC NGTD 3/1 MRSA by PCR negative  Antibiotics: Meropenem 3/1>> 3/2 Zosyn 3/1>>3/2 Vancomycin 2/28>> 3/2 Cefazolin 3/2>> 3/3 Ceftin 3/3>> 3/6   Discharge Exam: Filed Vitals:   01/11/16 0722 01/11/16 0725 01/11/16 1143 01/11/16 1145  BP:  126/83  112/90  Pulse:  112    Temp: 98.2 F (36.8 C)  99 F (37.2 C)   TempSrc: Oral  Oral   Resp:      Height:      Weight:      SpO2:  99%      General: Eyes open, noncommunicative, does not follow commands, No acute respiratory distress Lungs: Clear to auscultation bilaterally without wheezes or crackles Cardiovascular: tachycardic - regular - no appreciable M Abdomen: Nondistended, soft, bowel sounds positive, PEG insertion clean/dry  Extremities: No significant cyanosis, clubbing, or edema bilateral lower extremities  Discharge Instructions     Medication List    STOP taking these medications        famotidine 40 MG/5ML suspension  Commonly known as:  PEPCID     loperamide 2 MG tablet  Commonly known as:  IMODIUM A-D     Potassium Chloride 25 MEQ Pack      TAKE these medications        acetaminophen 325 MG tablet  Commonly known as:  TYLENOL  Place 2 tablets (650 mg total) into feeding tube every 6 (six) hours as needed for mild pain (or Fever >/= 101).     antiseptic oral rinse 0.05 % Liqd solution  Commonly known as:  CPC / CETYLPYRIDINIUM CHLORIDE 0.05%  7 mLs by Mouth Rinse route 2 (two) times daily.     bisacodyl 10 MG suppository  Commonly known as:  DULCOLAX  Place 1 suppository (10 mg total) rectally daily as needed for moderate constipation.     carboxymethylcellulose 0.5 % Soln  Commonly known as:  REFRESH PLUS  Place 2 drops into both eyes 3 (three) times daily.     chlorhexidine 0.12 % solution   Commonly known as:  PERIDEX  15 mLs by Mouth Rinse route 2 (two) times daily.     cholestyramine light 4 g packet  Commonly known as:  PREVALITE  Take 4 g by mouth 3 (three) times daily. mix in 8 oz. Of Liquid     feeding supplement (JEVITY 1.5 CAL/FIBER) Liqd  Place 1,000 mLs into feeding tube continuous.     feeding supplement (PRO-STAT SUGAR FREE 64) Liqd  Place 60 mLs into feeding tube 2 (two) times daily.     ferrous sulfate 300 (60 Fe) MG/5ML syrup  Place 5 mLs (300 mg total) into feeding tube 2 (two) times daily with a meal.     free water Soln  Take 100 mLs by mouth every 8 (eight) hours.     Lacosamide 100 MG Tabs  Place 1 tablet (100 mg total) into feeding tube 2 (two) times daily.     levETIRAcetam 100 MG/ML solution  Commonly known as:  KEPPRA  Place 15 mLs (1,500 mg total) into feeding tube 2 (two) times daily.     lisinopril 10 MG tablet  Commonly known as:  PRINIVIL,ZESTRIL  Place 1 tablet (10 mg total) into feeding tube daily.     Metoprolol Tartrate 75 MG Tabs  Place 75 mg into feeding tube 2 (two) times daily.     multivitamin with minerals Tabs tablet  Place 1 tablet into feeding tube daily with breakfast.     ondansetron 4 MG tablet  Commonly known as:  ZOFRAN  Place 1 tablet (4 mg total) into feeding tube every 6 (six) hours as needed for nausea.     polyethylene glycol packet  Commonly known as:  MIRALAX / GLYCOLAX  Place 17 g into feeding tube daily.     ranitidine 150 MG/10ML syrup  Commonly known as:  ZANTAC  Place 10 mLs (150 mg total) into feeding tube 2 (two) times daily.     traZODone 50 MG tablet  Commonly known as:  DESYREL  Place 0.5 tablets (25 mg total) into feeding tube at bedtime as needed for sleep.     vitamin C 1000 MG tablet  Give 1,000 mg by tube daily.     Vitamin D3 5000 units Tabs  Take 5,000 Units by mouth daily with breakfast.       Allergies  Allergen Reactions  . Amlodipine Swelling  . Latex Rash     Unknown   . Other Other (See Comments)    Natural Rubber- Unknown    Follow-up Information    Follow up with SNF Attending MD .       The results of significant diagnostics from this hospitalization (including imaging, microbiology, ancillary and laboratory) are listed below for reference.    Significant Diagnostic Studies: Ct Angio Chest Pe W/cm &/or Wo Cm  01/08/2016  CLINICAL DATA:  50 year old female with stroke. Concern for pulmonary embolism. EXAM: CT ANGIOGRAPHY CHEST WITH CONTRAST TECHNIQUE: Multidetector CT imaging of the chest was performed using the standard protocol during bolus administration of intravenous contrast. Multiplanar CT image reconstructions and MIPs were obtained to evaluate the vascular anatomy. CONTRAST:  80mL OMNIPAQUE IOHEXOL 350 MG/ML SOLN COMPARISON:  Chest radiograph dated 01/03/2016 FINDINGS: The lungs are clear. There is no pleural effusion or pneumothorax. The central airways are patent. The thoracic aorta appears unremarkable. Evaluation of the pulmonary arteries is limited due to suboptimal opacification of the distal branches as well as streak artifact caused by patient's arms. No definite central pulmonary artery embolus identified. Top-normal cardiac size. No pericardial effusion. There is no hilar or mediastinal adenopathy. The esophagus is grossly unremarkable. No thyroid nodules identified. There is no axillary adenopathy. The chest wall soft tissues appear unremarkable. There is mild degenerative changes of the spine. No acute fracture. The visualized upper abdomen appears unremarkable. Review of the MIP images confirms the above findings. IMPRESSION: No acute intrathoracic pathology. No CT evidence of central pulmonary artery embolus. Electronically Signed   By: Elgie Collard M.D.   On: 01/08/2016 00:55   Dg Chest Port 1 View  01/03/2016  CLINICAL DATA:  50 year old female with sepsis and tachypnea EXAM: PORTABLE CHEST 1 VIEW COMPARISON:  Radiograph  dated 12/04/2015 FINDINGS: The right-sided PICC is retracted and the tip now lies in the proximal right upper extremity likely in the axillary or brachial vein. Recommend evaluation and adjustment. Single-view of the chest does not demonstrate a focal consolidation. The lungs are hypovolemic. There is  no pleural effusion or pneumothorax. Stable cardiac silhouette. No acute osseous pathology. IMPRESSION: No acute cardiopulmonary process. Retracted right-sided PICC with tip in the proximal right arm. Recommend re-evaluation and repositioning. Electronically Signed   By: Elgie Collard M.D.   On: 01/03/2016 18:48    Microbiology: Recent Results (from the past 240 hour(s))  Urine culture     Status: None   Collection Time: 01/03/16  6:22 PM  Result Value Ref Range Status   Specimen Description URINE, CATHETERIZED  Final   Special Requests NONE  Final   Culture >=100,000 COLONIES/mL PROTEUS MIRABILIS  Final   Report Status 01/05/2016 FINAL  Final   Organism ID, Bacteria PROTEUS MIRABILIS  Final      Susceptibility   Proteus mirabilis - MIC*    AMPICILLIN <=2 SENSITIVE Sensitive     CEFAZOLIN 8 SENSITIVE Sensitive     CEFTRIAXONE <=1 SENSITIVE Sensitive     CIPROFLOXACIN >=4 RESISTANT Resistant     GENTAMICIN <=1 SENSITIVE Sensitive     IMIPENEM 4 SENSITIVE Sensitive     NITROFURANTOIN 128 RESISTANT Resistant     TRIMETH/SULFA <=20 SENSITIVE Sensitive     AMPICILLIN/SULBACTAM <=2 SENSITIVE Sensitive     PIP/TAZO <=4 SENSITIVE Sensitive     * >=100,000 COLONIES/mL PROTEUS MIRABILIS  Blood Culture (routine x 2)     Status: None   Collection Time: 01/03/16  7:00 PM  Result Value Ref Range Status   Specimen Description BLOOD RIGHT HAND  Final   Special Requests BOTTLES DRAWN AEROBIC ONLY 6CC  Final   Culture  Setup Time   Final    GRAM POSITIVE COCCI IN CLUSTERS AEROBIC BOTTLE ONLY CRITICAL RESULT CALLED TO, READ BACK BY AND VERIFIED WITH: Cindee Lame RN 13:45 01/04/16 ([email or web address])    Culture    Final    STAPHYLOCOCCUS SPECIES (COAGULASE NEGATIVE) THE SIGNIFICANCE OF ISOLATING THIS ORGANISM FROM A SINGLE SET OF BLOOD CULTURES WHEN MULTIPLE SETS ARE DRAWN IS UNCERTAIN. PLEASE NOTIFY THE MICROBIOLOGY DEPARTMENT WITHIN ONE WEEK IF SPECIATION AND SENSITIVITIES ARE REQUIRED.    Report Status 01/07/2016 FINAL  Final  Blood Culture (routine x 2)     Status: None   Collection Time: 01/03/16  7:05 PM  Result Value Ref Range Status   Specimen Description BLOOD RIGHT ANTECUBITAL  Final   Special Requests   Final    BOTTLES DRAWN AEROBIC AND ANAEROBIC BLUE 10CC RED 5CC   Culture NO GROWTH 5 DAYS  Final   Report Status 01/08/2016 FINAL  Final  MRSA PCR Screening     Status: None   Collection Time: 01/04/16  2:45 AM  Result Value Ref Range Status   MRSA by PCR NEGATIVE NEGATIVE Final    Comment:        The GeneXpert MRSA Assay (FDA approved for NASAL specimens only), is one component of a comprehensive MRSA colonization surveillance program. It is not intended to diagnose MRSA infection nor to guide or monitor treatment for MRSA infections.      Labs: Basic Metabolic Panel:  Recent Labs Lab 01/06/16 0628 01/07/16 0537 01/08/16 0808 01/09/16 0904 01/10/16 0617  NA 138 135 132* 136 135  K 3.5 3.4* 3.9 4.1 4.0  CL 108 106 102 103 104  CO2 19* 21* 21* 23 22  GLUCOSE 117* 123* 109* 124* 131*  BUN CREATININE 0.54 0.56 0.43* 0.44 0.44  CALCIUM 8.3* 8.4* 8.1* 8.5* 8.6*   Liver Function Tests:  Recent Labs Lab 01/05/16  0430 01/07/16 0537 01/08/16 0808 01/10/16 0617  AST 14* 18 21 16   ALT 16 11* 11* 13*  ALKPHOS 77 63 64 70  BILITOT 0.4 0.1* 0.4 0.2*  PROT 5.7* 5.7* 5.7* 6.4*  ALBUMIN 1.8* 1.9* 1.8* 2.1*   CBC:  Recent Labs Lab 01/06/16 0628 01/07/16 1033 01/08/16 0808 01/09/16 0904 01/10/16 0617  WBC 10.0 8.1 6.0 6.8 6.1  NEUTROABS 7.3  --   --   --   --   HGB 8.0* 8.6* 7.4* 7.1* 8.8*  HCT 24.5* 26.9* 23.5* 22.1* 27.1*  MCV 87.2 85.7 85.8  87.4 87.4  PLT 387 373 360 342 365   CBG:  Recent Labs Lab 01/10/16 1913 01/10/16 2359 01/11/16 0443 01/11/16 0723 01/11/16 1143  GLUCAP 106* 119* 113* 107* 110*    Signed:  Carolyne Littlesurtis Woods, MD [facility] [phone] pager  Addendum: D/C medications and D/C diagnosis list updated at actual time of d/c per Dr. Sharon SellerMcClung.

## 2016-01-11 LAB — GLUCOSE, CAPILLARY
GLUCOSE-CAPILLARY: 113 mg/dL — AB (ref 65–99)
GLUCOSE-CAPILLARY: 119 mg/dL — AB (ref 65–99)
Glucose-Capillary: 107 mg/dL — ABNORMAL HIGH (ref 65–99)
Glucose-Capillary: 110 mg/dL — ABNORMAL HIGH (ref 65–99)

## 2016-01-11 MED ORDER — LISINOPRIL 10 MG PO TABS: 10.0000 mg | ORAL_TABLET | Freq: Every day | ORAL | Status: DC

## 2016-01-11 MED ORDER — RANITIDINE HCL 150 MG/10ML PO SYRP: 150.0000 mg | ORAL_SOLUTION | Freq: Two times a day (BID) | ORAL | Status: DC

## 2016-01-11 MED ORDER — CHLORHEXIDINE GLUCONATE 0.12 % MT SOLN: 15.0000 mL | Freq: Two times a day (BID) | OROMUCOSAL | Status: AC

## 2016-01-11 MED ORDER — ACETAMINOPHEN 325 MG PO TABS: 650.0000 mg | ORAL_TABLET | Freq: Four times a day (QID) | ORAL | Status: AC | PRN

## 2016-01-11 MED ORDER — LACOSAMIDE 100 MG PO TABS: 100.0000 mg | ORAL_TABLET | Freq: Two times a day (BID) | ORAL | Status: AC

## 2016-01-11 MED ORDER — METOPROLOL TARTRATE 75 MG PO TABS: 75.0000 mg | ORAL_TABLET | Freq: Two times a day (BID) | ORAL | Status: DC

## 2016-01-11 MED ORDER — JEVITY 1.5 CAL/FIBER PO LIQD: 1000.0000 mL | ORAL | Status: DC

## 2016-01-11 MED ORDER — ONDANSETRON HCL 4 MG PO TABS: 4.0000 mg | ORAL_TABLET | Freq: Four times a day (QID) | ORAL | Status: AC | PRN

## 2016-01-11 MED ORDER — PRO-STAT SUGAR FREE PO LIQD: 60.0000 mL | Freq: Two times a day (BID) | ORAL | Status: DC

## 2016-01-11 MED ORDER — FERROUS SULFATE 300 (60 FE) MG/5ML PO SYRP: 300.0000 mg | ORAL_SOLUTION | Freq: Two times a day (BID) | ORAL | Status: DC

## 2016-01-11 MED ORDER — ADULT MULTIVITAMIN W/MINERALS CH: 1.0000 | ORAL_TABLET | Freq: Every day | ORAL | Status: DC

## 2016-01-11 MED ORDER — POLYETHYLENE GLYCOL 3350 17 G PO PACK: 17.0000 g | PACK | Freq: Every day | ORAL | Status: DC

## 2016-01-11 MED ORDER — BISACODYL 10 MG RE SUPP: 10.0000 mg | Freq: Every day | RECTAL | Status: DC | PRN

## 2016-01-11 MED ORDER — FREE WATER: 100.0000 mL | Freq: Three times a day (TID) | Status: DC

## 2016-01-11 MED ORDER — LEVETIRACETAM 100 MG/ML PO SOLN: 1500.0000 mg | Freq: Two times a day (BID) | ORAL | Status: AC

## 2016-01-11 MED ORDER — TRAZODONE HCL 50 MG PO TABS: 25.0000 mg | ORAL_TABLET | Freq: Every evening | ORAL | Status: AC | PRN

## 2016-01-11 NOTE — Progress Notes (Signed)
Attempted to call report to [facility]. 

## 2016-01-11 NOTE — Progress Notes (Signed)
[facility]

## 2016-01-11 NOTE — Progress Notes (Signed)
Paged Dr. Sharon SellerMcClung to complete the discharge med rec.

## 2016-01-11 NOTE — Progress Notes (Signed)
Patient will DC to: [facility] Anticipated DC date: 01/11/16 Family notified: Son Transport by: [facility]  CSW signing off.  Cristobal GoldmannNadia Mersedes Alber, [place] Clinical Social Worker [phone]

## 2016-01-11 NOTE — Progress Notes (Signed)
[facility] called [facility] Durward Parcel[facility]ine [facility]

## 2016-01-11 NOTE — Progress Notes (Signed)
Notified pt family of discharge.

## 2016-01-11 NOTE — Care Management Note (Signed)
Case Management Note  Patient Details  Name: [name] MRN: [id number] Date of Birth: 03/04/1966  Subjective/Objective:   Patient is  For dc back to SNF today, CSW following.                 Action/Plan:   Expected Discharge Date:                  Expected Discharge Plan:  Skilled Nursing Facility  In-House Referral:  Clinical Social Work  Discharge planning Services  CM Consult  Post Acute Care Choice:    Choice offered to:     DME Arranged:    DME Agency:     HH Arranged:    HH Agency:     Status of Service:  Completed, signed off  Medicare Important Message Given:  Yes Date Medicare IM Given:    Medicare IM give by:    Date Additional Medicare IM Given:    Additional Medicare Important Message give by:     If discussed at Long Length of Stay Meetings, dates discussed:    Additional Comments:  [name], Aliou Mealey Clinton, RN 01/11/2016, 1:10 PM

## 2016-01-20 ENCOUNTER — Emergency Department (HOSPITAL_COMMUNITY): Payer: Medicaid Other

## 2016-01-20 ENCOUNTER — Encounter (HOSPITAL_COMMUNITY): Payer: Self-pay | Admitting: *Deleted

## 2016-01-20 ENCOUNTER — Inpatient Hospital Stay (HOSPITAL_COMMUNITY)
Admission: EM | Admit: 2016-01-20 | Discharge: 2016-01-31 | DRG: 698 | Disposition: A | Discharge status: Hospice | Payer: Medicaid Other | Attending: Internal Medicine | Admitting: Internal Medicine

## 2016-01-20 DIAGNOSIS — Z87891 Personal history of nicotine dependence: Secondary | ICD-10-CM | POA: Diagnosis not present

## 2016-01-20 DIAGNOSIS — Z1612 Extended spectrum beta lactamase (ESBL) resistance: Secondary | ICD-10-CM | POA: Diagnosis present

## 2016-01-20 DIAGNOSIS — Z515 Encounter for palliative care: Secondary | ICD-10-CM | POA: Diagnosis present

## 2016-01-20 DIAGNOSIS — L89154 Pressure ulcer of sacral region, stage 4: Secondary | ICD-10-CM | POA: Diagnosis present

## 2016-01-20 DIAGNOSIS — I69365 Other paralytic syndrome following cerebral infarction, bilateral: Secondary | ICD-10-CM | POA: Diagnosis not present

## 2016-01-20 DIAGNOSIS — Z66 Do not resuscitate: Secondary | ICD-10-CM | POA: Diagnosis present

## 2016-01-20 DIAGNOSIS — B964 Proteus (mirabilis) (morganii) as the cause of diseases classified elsewhere: Secondary | ICD-10-CM | POA: Diagnosis present

## 2016-01-20 DIAGNOSIS — B961 Klebsiella pneumoniae [K. pneumoniae] as the cause of diseases classified elsewhere: Secondary | ICD-10-CM | POA: Diagnosis present

## 2016-01-20 DIAGNOSIS — E876 Hypokalemia: Secondary | ICD-10-CM | POA: Diagnosis present

## 2016-01-20 DIAGNOSIS — D649 Anemia, unspecified: Secondary | ICD-10-CM | POA: Diagnosis present

## 2016-01-20 DIAGNOSIS — Z7401 Bed confinement status: Secondary | ICD-10-CM

## 2016-01-20 DIAGNOSIS — Z931 Gastrostomy status: Secondary | ICD-10-CM | POA: Diagnosis not present

## 2016-01-20 DIAGNOSIS — D72829 Elevated white blood cell count, unspecified: Secondary | ICD-10-CM | POA: Diagnosis present

## 2016-01-20 DIAGNOSIS — Z888 Allergy status to other drugs, medicaments and biological substances status: Secondary | ICD-10-CM | POA: Diagnosis not present

## 2016-01-20 DIAGNOSIS — E860 Dehydration: Secondary | ICD-10-CM | POA: Diagnosis present

## 2016-01-20 DIAGNOSIS — T83511A Infection and inflammatory reaction due to indwelling urethral catheter, initial encounter: Secondary | ICD-10-CM

## 2016-01-20 DIAGNOSIS — R8271 Bacteriuria: Secondary | ICD-10-CM | POA: Diagnosis not present

## 2016-01-20 DIAGNOSIS — N390 Urinary tract infection, site not specified: Secondary | ICD-10-CM | POA: Diagnosis present

## 2016-01-20 DIAGNOSIS — Z91040 Latex allergy status: Secondary | ICD-10-CM

## 2016-01-20 DIAGNOSIS — R509 Fever, unspecified: Secondary | ICD-10-CM | POA: Diagnosis present

## 2016-01-20 DIAGNOSIS — T83518A Infection and inflammatory reaction due to other urinary catheter, initial encounter: Secondary | ICD-10-CM | POA: Diagnosis present

## 2016-01-20 DIAGNOSIS — Z8249 Family history of ischemic heart disease and other diseases of the circulatory system: Secondary | ICD-10-CM

## 2016-01-20 DIAGNOSIS — E870 Hyperosmolality and hypernatremia: Secondary | ICD-10-CM | POA: Diagnosis present

## 2016-01-20 DIAGNOSIS — I959 Hypotension, unspecified: Secondary | ICD-10-CM | POA: Diagnosis present

## 2016-01-20 DIAGNOSIS — G8250 Quadriplegia, unspecified: Secondary | ICD-10-CM | POA: Diagnosis present

## 2016-01-20 DIAGNOSIS — Z9109 Other allergy status, other than to drugs and biological substances: Secondary | ICD-10-CM | POA: Diagnosis not present

## 2016-01-20 DIAGNOSIS — I10 Essential (primary) hypertension: Secondary | ICD-10-CM | POA: Diagnosis present

## 2016-01-20 DIAGNOSIS — Z79899 Other long term (current) drug therapy: Secondary | ICD-10-CM | POA: Diagnosis not present

## 2016-01-20 DIAGNOSIS — R7881 Bacteremia: Secondary | ICD-10-CM

## 2016-01-20 DIAGNOSIS — D6489 Other specified anemias: Secondary | ICD-10-CM | POA: Diagnosis present

## 2016-01-20 DIAGNOSIS — G40909 Epilepsy, unspecified, not intractable, without status epilepticus: Secondary | ICD-10-CM | POA: Diagnosis present

## 2016-01-20 DIAGNOSIS — A412 Sepsis due to unspecified staphylococcus: Secondary | ICD-10-CM | POA: Diagnosis present

## 2016-01-20 DIAGNOSIS — R627 Adult failure to thrive: Secondary | ICD-10-CM | POA: Diagnosis present

## 2016-01-20 DIAGNOSIS — B9689 Other specified bacterial agents as the cause of diseases classified elsewhere: Secondary | ICD-10-CM | POA: Diagnosis present

## 2016-01-20 DIAGNOSIS — Z8673 Personal history of transient ischemic attack (TIA), and cerebral infarction without residual deficits: Secondary | ICD-10-CM

## 2016-01-20 DIAGNOSIS — Y846 Urinary catheterization as the cause of abnormal reaction of the patient, or of later complication, without mention of misadventure at the time of the procedure: Secondary | ICD-10-CM | POA: Diagnosis present

## 2016-01-20 DIAGNOSIS — A419 Sepsis, unspecified organism: Secondary | ICD-10-CM | POA: Diagnosis not present

## 2016-01-20 DIAGNOSIS — R52 Pain, unspecified: Secondary | ICD-10-CM

## 2016-01-20 DIAGNOSIS — R000 Tachycardia, unspecified: Secondary | ICD-10-CM

## 2016-01-20 HISTORY — DX: Anemia, unspecified: D64.9

## 2016-01-20 LAB — COMPREHENSIVE METABOLIC PANEL
ALBUMIN: 2.8 g/dL — AB (ref 3.5–5.0)
ALK PHOS: 186 U/L — AB (ref 38–126)
ALT: 76 U/L — ABNORMAL HIGH (ref 14–54)
ALT: 63 U/L — AB (ref 14–54)
ANION GAP: 13 (ref 5–15)
AST: 54 U/L — AB (ref 15–41)
AST: 44 U/L — AB (ref 15–41)
Albumin: 2.3 g/dL — ABNORMAL LOW (ref 3.5–5.0)
Alkaline Phosphatase: 151 U/L — ABNORMAL HIGH (ref 38–126)
Anion gap: 10 (ref 5–15)
BILIRUBIN TOTAL: 0.6 mg/dL (ref 0.3–1.2)
BILIRUBIN TOTAL: 0.6 mg/dL (ref 0.3–1.2)
BUN: 62 mg/dL — AB (ref 6–20)
BUN: 50 mg/dL — AB (ref 6–20)
CALCIUM: 9.8 mg/dL (ref 8.9–10.3)
CALCIUM: 8.8 mg/dL — AB (ref 8.9–10.3)
CHLORIDE: 125 mmol/L — AB (ref 101–111)
CO2: 24 mmol/L (ref 22–32)
CO2: 23 mmol/L (ref 22–32)
CREATININE: 0.93 mg/dL (ref 0.44–1.00)
Chloride: 120 mmol/L — ABNORMAL HIGH (ref 101–111)
Creatinine, Ser: 1.08 mg/dL — ABNORMAL HIGH (ref 0.44–1.00)
GFR calc Af Amer: >60 mL/min (ref >60)
GFR calc non Af Amer: 60 mL/min — ABNORMAL LOW (ref >60)
GLUCOSE: 125 mg/dL — AB (ref 65–99)
Glucose, Bld: 118 mg/dL — ABNORMAL HIGH (ref 65–99)
POTASSIUM: 4.8 mmol/L (ref 3.5–5.1)
Potassium: 3.9 mmol/L (ref 3.5–5.1)
SODIUM: 157 mmol/L — AB (ref 135–145)
Sodium: 158 mmol/L — ABNORMAL HIGH (ref 135–145)
TOTAL PROTEIN: 8.4 g/dL — AB (ref 6.5–8.1)
TOTAL PROTEIN: 7.1 g/dL (ref 6.5–8.1)

## 2016-01-20 LAB — CBC WITH DIFFERENTIAL/PLATELET
BASOS ABS: 0 10*3/uL (ref 0.0–0.1)
BASOS PCT: 0 %
Basophils Absolute: 0 10*3/uL (ref 0.0–0.1)
Basophils Relative: 0 %
EOS ABS: 0 10*3/uL (ref 0.0–0.7)
EOS PCT: 0 %
Eosinophils Absolute: 0 10*3/uL (ref 0.0–0.7)
Eosinophils Relative: 0 %
HCT: 26.3 % — ABNORMAL LOW (ref 36.0–46.0)
HEMATOCRIT: 30.8 % — AB (ref 36.0–46.0)
HEMOGLOBIN: 8.9 g/dL — AB (ref 12.0–15.0)
Hemoglobin: 7.7 g/dL — ABNORMAL LOW (ref 12.0–15.0)
LYMPHS ABS: 3.7 10*3/uL (ref 0.7–4.0)
LYMPHS ABS: 2.7 10*3/uL (ref 0.7–4.0)
LYMPHS PCT: 24 %
Lymphocytes Relative: 22 %
MCH: 26.7 pg (ref 26.0–34.0)
MCH: 26.1 pg (ref 26.0–34.0)
MCHC: 28.9 g/dL — AB (ref 30.0–36.0)
MCHC: 29.3 g/dL — ABNORMAL LOW (ref 30.0–36.0)
MCV: 92.5 fL (ref 78.0–100.0)
MCV: 89.2 fL (ref 78.0–100.0)
MONO ABS: 0.9 10*3/uL (ref 0.1–1.0)
MONOS PCT: 7 %
Monocytes Absolute: 1.1 10*3/uL — ABNORMAL HIGH (ref 0.1–1.0)
Monocytes Relative: 7 %
NEUTROS ABS: 10.6 10*3/uL — AB (ref 1.7–7.7)
NEUTROS PCT: 71 %
Neutro Abs: 8.8 10*3/uL — ABNORMAL HIGH (ref 1.7–7.7)
Neutrophils Relative %: 69 %
PLATELETS: 294 10*3/uL (ref 150–400)
Platelets: 351 10*3/uL (ref 150–400)
RBC: 3.33 MIL/uL — ABNORMAL LOW (ref 3.87–5.11)
RBC: 2.95 MIL/uL — AB (ref 3.87–5.11)
RDW: 17.7 % — AB (ref 11.5–15.5)
RDW: 17.6 % — AB (ref 11.5–15.5)
WBC: 15.4 10*3/uL — ABNORMAL HIGH (ref 4.0–10.5)
WBC: 12.4 10*3/uL — AB (ref 4.0–10.5)

## 2016-01-20 LAB — ABO/RH: ABO/RH(D): A POS

## 2016-01-20 LAB — TSH: TSH: 1.194 u[IU]/mL (ref 0.350–4.500)

## 2016-01-20 LAB — MRSA PCR SCREENING: MRSA by PCR: NEGATIVE

## 2016-01-20 LAB — URINE MICROSCOPIC-ADD ON

## 2016-01-20 LAB — URINALYSIS, ROUTINE W REFLEX MICROSCOPIC
BILIRUBIN URINE: NEGATIVE
GLUCOSE, UA: NEGATIVE mg/dL
KETONES UR: NEGATIVE mg/dL
Nitrite: POSITIVE — AB
PH: 7.5 (ref 5.0–8.0)
Protein, ur: 100 mg/dL — AB
SPECIFIC GRAVITY, URINE: 1.021 (ref 1.005–1.030)

## 2016-01-20 LAB — PHOSPHORUS: PHOSPHORUS: 4.1 mg/dL (ref 2.5–4.6)

## 2016-01-20 LAB — LACTIC ACID, PLASMA
LACTIC ACID, VENOUS: 2 mmol/L (ref 0.5–2.0)
Lactic Acid, Venous: 1.4 mmol/L (ref 0.5–2.0)

## 2016-01-20 LAB — PROTIME-INR
INR: 1.26 (ref 0.00–1.49)
PROTHROMBIN TIME: 16 s — AB (ref 11.6–15.2)

## 2016-01-20 LAB — I-STAT CG4 LACTIC ACID, ED: Lactic Acid, Venous: 2.52 mmol/L (ref 0.5–2.0)

## 2016-01-20 LAB — MAGNESIUM: Magnesium: 2.8 mg/dL — ABNORMAL HIGH (ref 1.7–2.4)

## 2016-01-20 LAB — PROCALCITONIN: PROCALCITONIN: 0.15 ng/mL

## 2016-01-20 LAB — PREPARE RBC (CROSSMATCH)

## 2016-01-20 LAB — APTT: aPTT: 27 seconds (ref 24–37)

## 2016-01-20 LAB — GLUCOSE, CAPILLARY: GLUCOSE-CAPILLARY: 95 mg/dL (ref 65–99)

## 2016-01-20 MED ORDER — PIPERACILLIN-TAZOBACTAM 3.375 G IVPB 30 MIN
3.3750 g | Freq: Once | INTRAVENOUS | Status: AC
  Administered 2016-01-20: 3.375 g via INTRAVENOUS
  Filled 2016-01-20: qty 50

## 2016-01-20 MED ORDER — FAMOTIDINE 40 MG/5ML PO SUSR: 20.0000 mg | Freq: Two times a day (BID) | ORAL | Status: DC

## 2016-01-20 MED ORDER — CHOLESTYRAMINE LIGHT 4 G PO PACK
4.0000 g | PACK | Freq: Three times a day (TID) | ORAL | Status: DC
  Administered 2016-01-20 – 2016-01-30 (×30): 4 g via ORAL
  Filled 2016-01-20 (×33): qty 1

## 2016-01-20 MED ORDER — FREE WATER
200.0000 mL | Freq: Three times a day (TID) | Status: DC
  Administered 2016-01-20 – 2016-01-30 (×28): 200 mL via ORAL

## 2016-01-20 MED ORDER — CHLORHEXIDINE GLUCONATE 0.12 % MT SOLN
15.0000 mL | Freq: Two times a day (BID) | OROMUCOSAL | Status: DC
  Administered 2016-01-20 – 2016-01-21 (×4): 15 mL via OROMUCOSAL
  Filled 2016-01-20: qty 15

## 2016-01-20 MED ORDER — VITAMIN C 500 MG PO TABS
1000.0000 mg | ORAL_TABLET | Freq: Every day | ORAL | Status: DC
  Administered 2016-01-20 – 2016-01-29 (×10): 1000 mg via ORAL
  Filled 2016-01-20 (×10): qty 2

## 2016-01-20 MED ORDER — ONDANSETRON HCL 4 MG PO TABS: 4.0000 mg | ORAL_TABLET | Freq: Four times a day (QID) | ORAL | Status: DC | PRN

## 2016-01-20 MED ORDER — ADULT MULTIVITAMIN W/MINERALS CH
1.0000 | ORAL_TABLET | Freq: Every day | ORAL | Status: DC
  Administered 2016-01-21 – 2016-01-30 (×10): 1
  Filled 2016-01-20 (×10): qty 1

## 2016-01-20 MED ORDER — ACETAMINOPHEN 650 MG RE SUPP
650.0000 mg | Freq: Once | RECTAL | Status: AC
  Administered 2016-01-20: 650 mg via RECTAL
  Filled 2016-01-20: qty 1

## 2016-01-20 MED ORDER — CETYLPYRIDINIUM CHLORIDE 0.05 % MT LIQD
7.0000 mL | Freq: Two times a day (BID) | OROMUCOSAL | Status: DC
  Administered 2016-01-20 – 2016-01-21 (×2): 7 mL via OROMUCOSAL

## 2016-01-20 MED ORDER — JEVITY 1.5 CAL/FIBER PO LIQD
60.0000 mL | ORAL | Status: DC
  Administered 2016-01-20: 60 mL
  Filled 2016-01-20 (×2): qty 1000

## 2016-01-20 MED ORDER — SODIUM CHLORIDE 0.9 % IV SOLN: Freq: Once | INTRAVENOUS | Status: DC

## 2016-01-20 MED ORDER — SODIUM CHLORIDE 0.9 % IV SOLN
INTRAVENOUS | Status: DC
  Administered 2016-01-20: 14:00:00 via INTRAVENOUS

## 2016-01-20 MED ORDER — ACETAMINOPHEN 160 MG/5ML PO SOLN
650.0000 mg | Freq: Every day | ORAL | Status: DC
  Administered 2016-01-21 – 2016-01-31 (×11): 650 mg
  Filled 2016-01-20 (×12): qty 20.3

## 2016-01-20 MED ORDER — POLYVINYL ALCOHOL 1.4 % OP SOLN
2.0000 [drp] | Freq: Three times a day (TID) | OPHTHALMIC | Status: DC
  Administered 2016-01-20 – 2016-01-31 (×31): 2 [drp] via OPHTHALMIC
  Filled 2016-01-20: qty 15

## 2016-01-20 MED ORDER — ACETAMINOPHEN 650 MG RE SUPP: 650.0000 mg | Freq: Four times a day (QID) | RECTAL | Status: DC | PRN

## 2016-01-20 MED ORDER — METOPROLOL TARTRATE 25 MG PO TABS
50.0000 mg | ORAL_TABLET | Freq: Two times a day (BID) | ORAL | Status: DC
  Filled 2016-01-20: qty 2

## 2016-01-20 MED ORDER — FERROUS SULFATE 300 (60 FE) MG/5ML PO SYRP
300.0000 mg | ORAL_SOLUTION | Freq: Two times a day (BID) | ORAL | Status: DC
  Administered 2016-01-20 – 2016-01-28 (×17): 300 mg
  Filled 2016-01-20 (×20): qty 5

## 2016-01-20 MED ORDER — VANCOMYCIN HCL IN DEXTROSE 1-5 GM/200ML-% IV SOLN
1000.0000 mg | Freq: Once | INTRAVENOUS | Status: AC
  Administered 2016-01-20: 1000 mg via INTRAVENOUS
  Filled 2016-01-20: qty 200

## 2016-01-20 MED ORDER — TRAZODONE HCL 50 MG PO TABS: 25.0000 mg | ORAL_TABLET | Freq: Every evening | ORAL | Status: DC | PRN

## 2016-01-20 MED ORDER — SODIUM CHLORIDE 0.9 % IV SOLN
INTRAVENOUS | Status: DC
  Administered 2016-01-20: 16:00:00 via INTRAVENOUS

## 2016-01-20 MED ORDER — VANCOMYCIN HCL IN DEXTROSE 1-5 GM/200ML-% IV SOLN
1000.0000 mg | Freq: Two times a day (BID) | INTRAVENOUS | Status: DC
  Administered 2016-01-21 – 2016-01-23 (×6): 1000 mg via INTRAVENOUS
  Filled 2016-01-20 (×6): qty 200

## 2016-01-20 MED ORDER — LACOSAMIDE 50 MG PO TABS
100.0000 mg | ORAL_TABLET | Freq: Two times a day (BID) | ORAL | Status: DC
  Administered 2016-01-20 – 2016-01-31 (×23): 100 mg
  Filled 2016-01-20 (×23): qty 2

## 2016-01-20 MED ORDER — ACETAMINOPHEN 325 MG PO TABS
650.0000 mg | ORAL_TABLET | Freq: Four times a day (QID) | ORAL | Status: DC | PRN
  Administered 2016-01-20 – 2016-01-22 (×4): 650 mg via ORAL
  Filled 2016-01-20 (×5): qty 2

## 2016-01-20 MED ORDER — RANITIDINE HCL 150 MG/10ML PO SYRP
150.0000 mg | ORAL_SOLUTION | Freq: Two times a day (BID) | ORAL | Status: DC
  Administered 2016-01-20 – 2016-01-30 (×21): 150 mg
  Filled 2016-01-20 (×22): qty 10

## 2016-01-20 MED ORDER — POLYETHYLENE GLYCOL 3350 17 G PO PACK
17.0000 g | PACK | Freq: Every day | ORAL | Status: DC
  Administered 2016-01-20: 17 g
  Filled 2016-01-20: qty 1

## 2016-01-20 MED ORDER — LEVETIRACETAM 100 MG/ML PO SOLN
1500.0000 mg | Freq: Two times a day (BID) | ORAL | Status: DC
  Administered 2016-01-20 – 2016-01-31 (×23): 1500 mg
  Filled 2016-01-20 (×25): qty 15

## 2016-01-20 MED ORDER — SODIUM CHLORIDE 0.9% FLUSH
3.0000 mL | Freq: Two times a day (BID) | INTRAVENOUS | Status: DC
  Administered 2016-01-21 – 2016-01-31 (×6): 3 mL via INTRAVENOUS

## 2016-01-20 MED ORDER — LOPERAMIDE HCL 2 MG PO CAPS
4.0000 mg | ORAL_CAPSULE | Freq: Four times a day (QID) | ORAL | Status: DC | PRN
  Administered 2016-01-23 – 2016-01-25 (×2): 4 mg via ORAL
  Filled 2016-01-20 (×2): qty 2

## 2016-01-20 MED ORDER — BISACODYL 10 MG RE SUPP: 10.0000 mg | Freq: Every day | RECTAL | Status: DC | PRN

## 2016-01-20 MED ORDER — SODIUM CHLORIDE 0.9 % IV BOLUS (SEPSIS)
500.0000 mL | INTRAVENOUS | Status: AC
  Administered 2016-01-20: 500 mL via INTRAVENOUS

## 2016-01-20 MED ORDER — SODIUM CHLORIDE 0.9 % IV BOLUS (SEPSIS)
1000.0000 mL | INTRAVENOUS | Status: AC
  Administered 2016-01-20 (×2): 1000 mL via INTRAVENOUS

## 2016-01-20 MED ORDER — VITAMIN D3 25 MCG (1000 UNIT) PO TABS
5000.0000 [IU] | ORAL_TABLET | Freq: Every day | ORAL | Status: DC
  Administered 2016-01-21 – 2016-01-28 (×8): 5000 [IU] via ORAL
  Filled 2016-01-20 (×16): qty 5

## 2016-01-20 MED ORDER — SODIUM CHLORIDE 0.9 % IV SOLN
1.0000 g | Freq: Three times a day (TID) | INTRAVENOUS | Status: DC
  Administered 2016-01-20 – 2016-01-22 (×5): 1 g via INTRAVENOUS
  Filled 2016-01-20 (×7): qty 1

## 2016-01-20 MED ORDER — PRO-STAT SUGAR FREE PO LIQD
60.0000 mL | Freq: Two times a day (BID) | ORAL | Status: DC
  Administered 2016-01-20 – 2016-01-29 (×20): 60 mL
  Filled 2016-01-20 (×21): qty 60

## 2016-01-20 NOTE — ED Notes (Signed)
Unsuccessful IV attempt.  Labs collected.  Other staff will attempt IV.

## 2016-01-20 NOTE — ED Notes (Signed)
Bed: [id number] Expected date:  Expected time:  Means of arrival:  Comments: EMS/48/fever

## 2016-01-20 NOTE — H&P (Signed)
[facility] History and Physical  [name] ZOX:[id number] DOB: 1965-11-16 DOA: 01/20/2016  Referring physician: ER physician: Dr. Jeraldine Loots  PCP: Ruthe Mannan, MD  Chief Complaint: fever  HPI:  49 year old female with past medical history of CVA, non verbal, has feeding tube, seizure disorder, recent hospitalization (12/03/2015 - 12/08/2015) for sepsis, UTI due to ESBL, subsequent admission for sepsis and GI bleed. She presented to [facility] ED from SNF for fevers. Pt is not able to provide any history. No respiratory distress. No falls. No loss of consciousness.   In ED< BP was 86/58, improved to 121/84 with IV fluids, HR was 115-149, RR 18-54, oxygen saturation 94% on room air. Blood work showed WBC count 12.4, hemoglobin 7.7, sodium 158, lactic acid was 2.52. She was found to have UTI and was started on meropenem and vanco due to recent h/o ESBL UTI. She was admitted to SDU for management of sepsis due to UTI.  Assessment & Plan    Principal Problem:   Sepsis secondary to UTI (HCC) / UTI (urinary tract infection) due to urinary indwelling Foley catheter (HCC) / Leukocytosis - Sepsis criteria emt on admission with fever, tachycardia, tachypnea, hypotension, leukocytosis nad evidence of infection based on UA - Lactic acid 2.5, procalcitonin level 0.15 - Started meropenem and vanco due to history of ESBL - Follow up blood culture and urine culture results - Monitor in SDU - Continue IV fluids for now  Active Problems:   Hx of ischemic ACA stroke / S/P percutaneous endoscopic gastrostomy (PEG) tube placement (HCC) / FTT (failure to thrive) in adult - Continue peg tube feeds     Decubitus ulcer of sacral region, stage 4 (HCC) - WOC consulted  - Appreciate their assessment     Normocytic anemia - Baseline Hgb 8.4 - Hemoglobin drop noted since admission from 8.9 --> 7.7 - No evidence of gross bleed - Transfuse 1 U PRBC  - F/U CBC in am       Essential hypertension -  Metoprolol on hold due to hypotension    Seizure disorder (HCC) - No reports of seizures so far - Continue Vimpat and Keppra    Hypernatremia - Likely dehydration, sepsis - Continue water flushes for peg tube feeds  - F/U BMP in am    Functional quadriplegia - In the setting of chronic illness - Will return to SNF once medically stable for discharge     Sacral decubitus ulcer  - Stage IV, managed with wound vac  - Presented with wound vac dressings in place, but no wound vac  - Continue wound VAC, continue Foley   Essential hypertension - BP reasonably controlled at this time   Seizure disorder  - Reportedly stable on Vimpat and Keppra - continue  - No suggestion of active seizure activity   Hypernatremia  - Resolved   DVT prophylaxis:  - SCD's bilaterally   Radiological Exams on Admission: Dg Chest Port 1 View 01/20/2016  No edema or consolidation. Electronically Signed   By: Bretta Bang III M.D.   On: 01/20/2016 12:11    EKG: I have personally reviewed EKG. EKG shows sinus tachycardia   Code Status: Full Family Communication: Plan of care discussed with the patient  Disposition Plan: Admit for further evaluation  Manson Passey, MD  [facility] Pager [phone]  Time spent in minutes: 75 minutes  Review of Systems:  Unable to obtain due to patient's mental stouts   Past Medical History  Diagnosis Date  .  Hypertension   . Stroke (HCC)   . Anemia    Past Surgical History  Procedure Laterality Date  . Abdominal hysterectomy    . Cholecystectomy    . Flexible sigmoidoscopy N/A 10/25/2015    Procedure: FLEXIBLE SIGMOIDOSCOPY;  Surgeon: Carman ChingJames Edwards, MD;  [facility];  Service: Endoscopy;  Laterality: N/A;   Social History:  reports that she has quit smoking. Her smoking use included Cigarettes. She has never used smokeless tobacco. She reports that she does not drink alcohol or use illicit drugs.  Allergies  Allergen  Reactions  . Amlodipine Swelling  . Latex Rash    Unknown   . Other Other (See Comments)    Natural Rubber- Unknown     Family History:  Family History  Problem Relation Age of Onset  . Heart disease Mother [id number]    MI     Prior to Admission medications   Medication Sig Start Date End Date Taking? Authorizing Provider  acetaminophen (TYLENOL) 325 MG tablet Place 2 tablets (650 mg total) into feeding tube every 6 (six) hours as needed for mild pain (or Fever >/= 101). Patient taking differently: Place 650 mg into feeding tube daily.  01/11/16  Yes Lonia BloodJeffrey T McClung, MD  Amino Acids-Protein Hydrolys (FEEDING SUPPLEMENT, PRO-STAT SUGAR FREE 64,) LIQD Place 60 mLs into feeding tube 2 (two) times daily. 01/11/16  Yes Lonia BloodJeffrey T McClung, MD  antiseptic oral rinse (CPC / CETYLPYRIDINIUM CHLORIDE 0.05%) 0.05 % LIQD solution 7 mLs by Mouth Rinse route 2 (two) times daily. 12/08/15  Yes Alison MurrayAlma M Marcela Alatorre, MD  Ascorbic Acid (VITAMIN C) 1000 MG tablet Give 1,000 mg by tube daily.    Yes Historical Provider, MD  bisacodyl (DULCOLAX) 10 MG suppository Place 1 suppository (10 mg total) rectally daily as needed for moderate constipation. 01/11/16  Yes Lonia BloodJeffrey T McClung, MD  carboxymethylcellulose (REFRESH PLUS) 0.5 % SOLN Place 2 drops into both eyes 3 (three) times daily.   Yes Historical Provider, MD  chlorhexidine (PERIDEX) 0.12 % solution 15 mLs by Mouth Rinse route 2 (two) times daily. 01/11/16  Yes Lonia BloodJeffrey T McClung, MD  Cholecalciferol (VITAMIN D3) 5000 UNITS TABS Take 5,000 Units by mouth daily with breakfast.    Yes Historical Provider, MD  cholestyramine light (PREVALITE) 4 G packet Take 4 g by mouth 3 (three) times daily. mix in 8 oz. Of Liquid   Yes Historical Provider, MD  famotidine (PEPCID) 40 MG/5ML suspension Take 20 mg by mouth every 12 (twelve) hours.   Yes Historical Provider, MD  ferrous sulfate 300 (60 Fe) MG/5ML syrup Place 5 mLs (300 mg total) into feeding tube 2 (two) times daily with a meal.  01/11/16  Yes Lonia BloodJeffrey T McClung, MD  lacosamide 100 MG TABS Place 1 tablet (100 mg total) into feeding tube 2 (two) times daily. 01/11/16  Yes Lonia BloodJeffrey T McClung, MD  levETIRAcetam (KEPPRA) 100 MG/ML solution Place 15 mLs (1,500 mg total) into feeding tube 2 (two) times daily. 01/11/16  Yes Lonia BloodJeffrey T McClung, MD  lisinopril (PRINIVIL,ZESTRIL) 10 MG tablet Place 1 tablet (10 mg total) into feeding tube daily. 01/11/16  Yes Lonia BloodJeffrey T McClung, MD  loperamide (IMODIUM A-D) 2 MG tablet Give 4 mg by tube 4 (four) times daily as needed for diarrhea or loose stools.   Yes Historical Provider, MD  metoprolol (LOPRESSOR) 50 MG tablet Give 50 mg by tube 2 (two) times daily.   Yes Historical Provider, MD  Multiple Vitamin (MULTIVITAMIN WITH MINERALS) TABS tablet Place  1 tablet into feeding tube daily with breakfast. 01/11/16  Yes Lonia Blood, MD  Nutritional Supplements (FEEDING SUPPLEMENT, JEVITY 1.5 CAL/FIBER,) LIQD Place 1,000 mLs into feeding tube continuous. Patient taking differently: Place 60 mLs into feeding tube continuous.  01/11/16  Yes Lonia Blood, MD  ondansetron (ZOFRAN) 4 MG tablet Place 1 tablet (4 mg total) into feeding tube every 6 (six) hours as needed for nausea. 01/11/16  Yes Lonia Blood, MD  polyethylene glycol [facility] / GLYCOLAX) packet Place 17 g into feeding tube daily. 01/11/16  Yes Lonia Blood, MD  Potassium Chloride 25 MEQ PACK Take 25 mEq by mouth daily.   Yes Historical Provider, MD  ranitidine (ZANTAC) 150 MG/10ML syrup Place 10 mLs (150 mg total) into feeding tube 2 (two) times daily. 01/11/16  Yes Lonia Blood, MD  traZODone (DESYREL) 50 MG tablet Place 0.5 tablets (25 mg total) into feeding tube at bedtime as needed for sleep. 01/11/16  Yes Lonia Blood, MD  Water For Irrigation, Sterile (FREE WATER) SOLN Take 100 mLs by mouth every 8 (eight) hours. Patient taking differently: Take 200 mLs by mouth every 8 (eight) hours.  01/11/16  Yes Lonia Blood, MD   metoprolol 75 MG TABS Place 75 mg into feeding tube 2 (two) times daily. Patient not taking: Reported on 01/20/2016 01/11/16   Lonia Blood, MD   Physical Exam: Filed Vitals:   01/20/16 1230 01/20/16 1245 01/20/16 1250 01/20/16 1332  BP: 103/73 100/73  104/74  Pulse: 138 137  132  Temp:   100.9 F (38.3 C)   TempSrc:   Rectal   Resp: 51 50  28  Height:      Weight:      SpO2: 98% 98%  100%    Physical Exam  Constitutional: Appears well-developed and well-nourished. No distress. Nonverbal HENT: Normocephalic. No tonsillar erythema or exudates Eyes: Conjunctivae are normal. No scleral icterus.  Neck: Neck supple. No JVD. No tracheal deviation. No thyromegaly.  CVS: RRR, S1/S2 appreciated  Pulmonary: Effort and breath sounds normal, no stridor, rhonchi, wheezes, rales.  Abdominal: Soft. BS +,  no distension, tenderness, rebound or guarding. (+) PEG Musculoskeletal: Normal range of motion. No edema and no tenderness.  Lymphadenopathy: No lymphadenopathy noted, cervical, inguinal. Neuro: non verbal, does not follow even simple commands  Skin: Skin is warm and dry. No erythema. No pallor.  Psychiatric: Unable to obtain due to pt mental status   Labs on Admission:  Basic Metabolic Panel:  Recent Labs Lab 01/20/16 1147  NA 157*  K 4.8  CL 120*  CO2 24  GLUCOSE 125*  BUN 62*  CREATININE 1.08*  CALCIUM 9.8   Liver Function Tests:  Recent Labs Lab 01/20/16 1147  AST 54*  ALT 76*  ALKPHOS 186*  BILITOT 0.6  PROT 8.4*  ALBUMIN 2.8*   No results for input(s): LIPASE, AMYLASE in the last 168 hours. No results for input(s): AMMONIA in the last 168 hours. CBC:  Recent Labs Lab 01/20/16 1147  WBC 15.4*  NEUTROABS 10.6*  HGB 8.9*  HCT 30.8*  MCV 92.5  PLT 351   Cardiac Enzymes: No results for input(s): CKTOTAL, CKMB, CKMBINDEX, TROPONINI in the last 168 hours. BNP: Invalid input(s): POCBNP CBG: No results for input(s): GLUCAP in the last 168 hours.  If  7PM-7AM, please contact night-coverage [email or web address] Password [email or web address] 01/20/2016, 1:59 PM

## 2016-01-20 NOTE — ED Notes (Signed)
[facility] onset yesterday; given Tylenol yesterday morning with relief.

## 2016-01-20 NOTE — ED Notes (Signed)
[facility], Diplomatic Services operational officersecretary from facility reports will call back.

## 2016-01-20 NOTE — Clinical Social Work Note (Addendum)
Clinical Social Work Assessment  Patient Details  Name: Meghan PoreLaura Marie Speece MRN: [id number] Date of Birth: 02/02/1966  Date of referral:  01/20/16               Reason for consult:  Other (Comment Required) (Per consult, patient from [facility])                Permission sought to share information with:    Permission granted to share information::     Name::        Agency::     Relationship::     Contact Information:     Housing/Transportation Living arrangements for the past 2 months:  Skilled Holiday representativeursing Facility (From [facility]) Source of Information: Information provided from Nurse  Patient Interpreter Needed:  None Criminal Activity/Legal Involvement Pertinent to Current Situation/Hospitalization:  No - Comment as needed Significant Relationships: Unknown at this time Lives with:  Facility Resident Do you feel safe going back to the place where you live?   Unknown Need for family participation in patient care:   Unknown  Care giving concerns: Patient is non-verbal per MD note.   Social Worker assessment / plan: CSW unable to assess as patient is non-verbal.  Employment status:    Insurance information:   PT Recommendations:  Not assessed at this time Information / Referral to community resources:  Other (Comment Required) (None given at this time)  Patient/Family's Response to care: Unknown at this time  Patient/Family's Understanding of and Emotional Response to Diagnosis, Current Treatment, and Prognosis: Unknown at this time  Emotional Assessment Appearance:   Attitude/Demeanor/Rapport: Unknown  Affect (typically observed):   Orientation:   Alcohol / Substance use:  Not Applicable Psych involvement (Current and /or in the community):  No (Comment)  Discharge Needs  Concerns to be addressed:  Other (Comment Required (None identified at this time) Readmission within the last 30 days:    Current discharge risk:  Other (Unknown at this  time) Barriers to Discharge:  Other (Unknown at this time)   Claudean SeveranceLaVonia M Jazia Faraci, LCSW 01/20/2016, 3:05 PM

## 2016-01-20 NOTE — ED Provider Notes (Signed)
CSN: [id number]     Arrival date & time 01/20/16  1104 History   First MD Initiated Contact with Patient 01/20/16 1130     Chief Complaint  Patient presents with  . Fever     (Consider location/radiation/quality/duration/timing/severity/associated sxs/prior Treatment) HPI Patient presents from her nursing facility with concern for fever. Patient is nonverbal, cannot provide any details of her history of present illness. Level V caveat.  Patient is presenting from a nursing facility, history is provided by EMS providers, and nursing staff cannot provide any details of what prompted evaluation today. However, patient seemingly went for urine catheter exchange and was found to be febrile.  Past Medical History  Diagnosis Date  . Hypertension   . Stroke (HCC)   . Anemia    Past Surgical History  Procedure Laterality Date  . Abdominal hysterectomy    . Cholecystectomy    . Flexible sigmoidoscopy N/A 10/25/2015    Procedure: FLEXIBLE SIGMOIDOSCOPY;  Surgeon: Carman Ching, MD;  Location: [facility] ENDOSCOPY;  Service: Endoscopy;  Laterality: N/A;   Family History  Problem Relation Age of Onset  . Heart disease Mother 53    MI   Social History  Substance Use Topics  . Smoking status: Former Smoker    Types: Cigarettes  . Smokeless tobacco: Never Used     Comment: trying to quit-2 cigarettes daily  . Alcohol Use: No     Comment: Very seldom   OB History    No data available     Review of Systems  Unable to perform ROS: Patient nonverbal      Allergies  Amlodipine; Latex; and Other  Home Medications   Prior to Admission medications   Medication Sig Start Date End Date Taking? Authorizing Provider  acetaminophen (TYLENOL) 325 MG tablet Place 2 tablets (650 mg total) into feeding tube every 6 (six) hours as needed for mild pain (or Fever >/= 101). Patient taking differently: Place 650 mg into feeding tube every 4 (four) hours as needed for mild pain (or Fever >/= 101).   01/11/16   Lonia Blood, MD  Amino Acids-Protein Hydrolys (FEEDING SUPPLEMENT, PRO-STAT SUGAR FREE 64,) LIQD Place 60 mLs into feeding tube 2 (two) times daily. 01/11/16   Lonia Blood, MD  antiseptic oral rinse (CPC / CETYLPYRIDINIUM CHLORIDE 0.05%) 0.05 % LIQD solution 7 mLs by Mouth Rinse route 2 (two) times daily. 12/08/15   Alison Murray, MD  Ascorbic Acid (VITAMIN C) 1000 MG tablet Give 1,000 mg by tube daily.     Historical Provider, MD  bisacodyl (DULCOLAX) 10 MG suppository Place 1 suppository (10 mg total) rectally daily as needed for moderate constipation. 01/11/16   Lonia Blood, MD  carboxymethylcellulose (REFRESH PLUS) 0.5 % SOLN Place 2 drops into both eyes 3 (three) times daily.    Historical Provider, MD  chlorhexidine (PERIDEX) 0.12 % solution 15 mLs by Mouth Rinse route 2 (two) times daily. 01/11/16   Lonia Blood, MD  Cholecalciferol (VITAMIN D3) 5000 UNITS TABS Take 5,000 Units by mouth daily with breakfast.     Historical Provider, MD  cholestyramine light (PREVALITE) 4 G packet Take 4 g by mouth 3 (three) times daily. mix in 8 oz. Of Liquid    Historical Provider, MD  ferrous sulfate 300 (60 Fe) MG/5ML syrup Place 5 mLs (300 mg total) into feeding tube 2 (two) times daily with a meal. 01/11/16   Lonia Blood, MD  lacosamide 100 MG TABS Place 1 tablet (  100 mg total) into feeding tube 2 (two) times daily. 01/11/16   Lonia BloodJeffrey T McClung, MD  levETIRAcetam (KEPPRA) 100 MG/ML solution Place 15 mLs (1,500 mg total) into feeding tube 2 (two) times daily. 01/11/16   Lonia BloodJeffrey T McClung, MD  lisinopril (PRINIVIL,ZESTRIL) 10 MG tablet Place 1 tablet (10 mg total) into feeding tube daily. 01/11/16   Lonia BloodJeffrey T McClung, MD  metoprolol 75 MG TABS Place 75 mg into feeding tube 2 (two) times daily. 01/11/16   Lonia BloodJeffrey T McClung, MD  Multiple Vitamin (MULTIVITAMIN WITH MINERALS) TABS tablet Place 1 tablet into feeding tube daily with breakfast. 01/11/16   Lonia BloodJeffrey T McClung, MD  Nutritional  Supplements (FEEDING SUPPLEMENT, JEVITY 1.5 CAL/FIBER,) LIQD Place 1,000 mLs into feeding tube continuous. 01/11/16   Lonia BloodJeffrey T McClung, MD  ondansetron (ZOFRAN) 4 MG tablet Place 1 tablet (4 mg total) into feeding tube every 6 (six) hours as needed for nausea. 01/11/16   Lonia BloodJeffrey T McClung, MD  polyethylene glycol [facility](MIRALAX / Ethelene HalGLYCOLAX) packet Place 17 g into feeding tube daily. 01/11/16   Lonia BloodJeffrey T McClung, MD  ranitidine (ZANTAC) 150 MG/10ML syrup Place 10 mLs (150 mg total) into feeding tube 2 (two) times daily. 01/11/16   Lonia BloodJeffrey T McClung, MD  traZODone (DESYREL) 50 MG tablet Place 0.5 tablets (25 mg total) into feeding tube at bedtime as needed for sleep. 01/11/16   Lonia BloodJeffrey T McClung, MD  Water For Irrigation, Sterile (FREE WATER) SOLN Take 100 mLs by mouth every 8 (eight) hours. 01/11/16   Lonia BloodJeffrey T McClung, MD   BP 100/73 mmHg  Pulse 137  Temp(Src) 100.9 F (38.3 C) (Rectal)  Resp 50  Ht 5\' 5"  (1.651 m)  Wt 184 lb (83.462 kg)  BMI 30.62 kg/m2  SpO2 98% Physical Exam  Constitutional: She has a sickly appearance.  Ill-appearing female with contracture in all 4 extremities, not interactive  HENT:  Head: Normocephalic and atraumatic.  Eyes: Pupils are equal, round, and reactive to light. Right eye exhibits no discharge. Left eye exhibits no discharge.  Cardiovascular: Regular rhythm and normal heart sounds.  Tachycardia present.  Exam reveals no friction rub.   No murmur heard. Pulmonary/Chest: Effort normal and breath sounds normal. No stridor. No respiratory distress. She has no wheezes.  Abdominal: Soft. Bowel sounds are normal. She exhibits no distension. There is no rebound and no guarding.  Genitourinary:  External hemorrhoid  Musculoskeletal: Normal range of motion. She exhibits no edema or tenderness.  Neurological: She displays atrophy. She exhibits abnormal muscle tone. Coordination abnormal.  Alert but unable to evaluate orientation due to averbal status  Skin: Skin is warm. No rash  noted. She is diaphoretic.     Psychiatric: Cognition and memory are impaired.  Nursing note and vitals reviewed.   ED Course  Procedures (including critical care time) Labs Review Labs Reviewed  COMPREHENSIVE METABOLIC PANEL - Abnormal; Notable for the following:    Sodium 157 (*)    Chloride 120 (*)    Glucose, Bld 125 (*)    BUN 62 (*)    Creatinine, Ser 1.08 (*)    Total Protein 8.4 (*)    Albumin 2.8 (*)    AST 54 (*)    ALT 76 (*)    Alkaline Phosphatase 186 (*)    GFR calc non Af Amer 60 (*)    All other components within normal limits  CBC WITH DIFFERENTIAL/PLATELET - Abnormal; Notable for the following:    WBC 15.4 (*)    RBC  3.33 (*)    Hemoglobin 8.9 (*)    HCT 30.8 (*)    MCHC 28.9 (*)    RDW 17.7 (*)    Neutro Abs 10.6 (*)    Monocytes Absolute 1.1 (*)    All other components within normal limits  URINALYSIS, ROUTINE W REFLEX MICROSCOPIC (NOT AT [facility]) - Abnormal; Notable for the following:    APPearance TURBID (*)    Hgb urine dipstick TRACE (*)    Protein, ur 100 (*)    Nitrite POSITIVE (*)    Leukocytes, UA LARGE (*)    All other components within normal limits  URINE MICROSCOPIC-ADD ON - Abnormal; Notable for the following:    Squamous Epithelial / LPF 0-5 (*)    Bacteria, UA MANY (*)    All other components within normal limits  I-STAT CG4 LACTIC ACID, ED - Abnormal; Notable for the following:    Lactic Acid, Venous 2.52 (*)    All other components within normal limits  CULTURE, BLOOD (ROUTINE X 2)  CULTURE, BLOOD (ROUTINE X 2)  URINE CULTURE    Imaging Review Dg Chest Port 1 View  01/20/2016  CLINICAL DATA:  Fever for 1 day EXAM: PORTABLE CHEST 1 VIEW COMPARISON:  Chest radiograph January 03, 2016; chest CT January 08, 2016 FINDINGS: There is no edema or consolidation. Heart size and pulmonary vascularity are normal. No adenopathy. No bone lesions. IMPRESSION: No edema or consolidation. Electronically Signed   By: Bretta Bang III M.D.   On:  01/20/2016 12:11   I have personally reviewed and evaluated these images and lab results as part of my medical decision-making.  EKG with sinus tachycardia, 153, artifact, abnormal  Patient's initial lactic acid is 2.5. Fluid resuscitation started empirically, antibiotics will be provided empirically.  1:11 PM Patient remains febrile.  Rectal tylenol provided.  Patient's labs notable for hypernatremia, acute kidney injury.  On repeat exam the patient appears more alert, seems to acknowledge interventions, interactions. She has gone to receive her antibiotics, fluids.  MDM  This patient with baseline nonverbal status, resident of a nursing facility presents febrile. Patient status complaints ability to obtain full details of history of present illness, but it is unclear when the patient became sick. Here the patient is febrile, tachycardic, tachypneic, has evidence for sepsis. Patient has improved mentation, though again, has notable difficulty with interaction. Patient initial labs notable for hypernatremia, urinary tract infection. Patient received fluids, antibiotic, per protocol, required admission for further evaluation and management.  CRITICAL CARE Performed by: Gerhard Munch Total critical care time: 35 minutes Critical care time was exclusive of separately billable procedures and treating other patients. Critical care was necessary to treat or prevent imminent or life-threatening deterioration. Critical care was time spent personally by me on the following activities: development of treatment plan with patient and/or surrogate as well as nursing, discussions with consultants, evaluation of patient's response to treatment, examination of patient, obtaining history from patient or surrogate, ordering and performing treatments and interventions, ordering and review of laboratory studies, ordering and review of radiographic studies, pulse oximetry and re-evaluation of patient's  condition.    Gerhard Munch, MD 01/20/16 1314

## 2016-01-20 NOTE — Progress Notes (Signed)
Pharmacy Antibiotic Note  Meghan PoreLaura Marie Lawson is a 50 y.o. female admitted on 01/20/2016 with sepsis.  Pharmacy has been consulted for vancomycin and meropenem dosing given a history of ESBL.  Plan: Vancomycin 1g IV every 12 hours.  Goal trough 15-20 mcg/mL.  Check trough at steady state Meropenem 1g IV q8h Follow up renal function & cultures De-escalate as appropriate  Height: 5\' 5"  (165.1 cm) (taken 3/8 per facility) Weight: 184 lb (83.462 kg) (taken 3/8 per facility) IBW/kg (Calculated) : 57  Temp (24hrs), Avg:102.9 F (39.4 C), Min:102.9 F (39.4 C), Max:102.9 F (39.4 C)   Recent Labs Lab 01/20/16 1147 01/20/16 1154  WBC 15.4*  --   CREATININE 1.08*  --   LATICACIDVEN  --  2.52*    Estimated Creatinine Clearance: 68 mL/min (by C-G formula based on Cr of 1.08).    Allergies  Allergen Reactions  . Amlodipine Swelling  . Latex Rash    Unknown   . Other Other (See Comments)    Natural Rubber- Unknown     Antimicrobials this admission: 3/17 Zosyn x 1 3/17 >> Vanc >> 3/17 >> Meropenem >>  Dose adjustments this admission: ---  Microbiology results: 12/04/15 PICC: ESBL Klebsiella 12/04/15 Cath tip: ESBL Klebsiella and Pseudomonas (pansensitive) 01/03/16 UCx: Proteus mirabilis (R cipro, nitro) 3/17 BCx: sent 3/17 UCx: sent   Thank you for allowing pharmacy to be a part of this patient's care.  Loralee PacasErin Jacki Couse, PharmD, BCPS Pager: [phone] 01/20/2016 12:50 PM

## 2016-01-20 NOTE — ED Notes (Signed)
Per [facility], pt transported from doctor's office d/t possible sepsis.  Was at her PCP for foley catheter change, had a fever of 100 axillary.  Pt is non-verbal.

## 2016-01-20 NOTE — ED Notes (Signed)
Labs collected but still attempting IV.

## 2016-01-20 NOTE — ED Notes (Signed)
Portable DG in process; will attempt IV with completion.

## 2016-01-21 LAB — CBC
HEMATOCRIT: 29.1 % — AB (ref 36.0–46.0)
HEMOGLOBIN: 8.6 g/dL — AB (ref 12.0–15.0)
MCH: 27.6 pg (ref 26.0–34.0)
MCHC: 29.6 g/dL — ABNORMAL LOW (ref 30.0–36.0)
MCV: 93.3 fL (ref 78.0–100.0)
Platelets: 277 10*3/uL (ref 150–400)
RBC: 3.12 MIL/uL — AB (ref 3.87–5.11)
RDW: 17.6 % — ABNORMAL HIGH (ref 11.5–15.5)
WBC: 11.3 10*3/uL — AB (ref 4.0–10.5)

## 2016-01-21 LAB — COMPREHENSIVE METABOLIC PANEL
ALBUMIN: 2.2 g/dL — AB (ref 3.5–5.0)
ALT: 50 U/L (ref 14–54)
ANION GAP: 7 (ref 5–15)
AST: 26 U/L (ref 15–41)
Alkaline Phosphatase: 125 U/L (ref 38–126)
BILIRUBIN TOTAL: 0.7 mg/dL (ref 0.3–1.2)
BUN: 43 mg/dL — ABNORMAL HIGH (ref 6–20)
CALCIUM: 8.8 mg/dL — AB (ref 8.9–10.3)
CO2: 21 mmol/L — ABNORMAL LOW (ref 22–32)
Chloride: 127 mmol/L — ABNORMAL HIGH (ref 101–111)
Creatinine, Ser: 0.61 mg/dL (ref 0.44–1.00)
Glucose, Bld: 137 mg/dL — ABNORMAL HIGH (ref 65–99)
POTASSIUM: 3.9 mmol/L (ref 3.5–5.1)
Sodium: 155 mmol/L — ABNORMAL HIGH (ref 135–145)
TOTAL PROTEIN: 6.7 g/dL (ref 6.5–8.1)

## 2016-01-21 LAB — C DIFFICILE QUICK SCREEN W PCR REFLEX
C DIFFICILE (CDIFF) INTERP: NEGATIVE
C DIFFICILE (CDIFF) TOXIN: NEGATIVE
C DIFFICLE (CDIFF) ANTIGEN: NEGATIVE

## 2016-01-21 LAB — GLUCOSE, CAPILLARY: Glucose-Capillary: 101 mg/dL — ABNORMAL HIGH (ref 65–99)

## 2016-01-21 MED ORDER — HEPARIN SODIUM (PORCINE) 5000 UNIT/ML IJ SOLN
5000.0000 [IU] | Freq: Three times a day (TID) | INTRAMUSCULAR | Status: DC
  Administered 2016-01-21 – 2016-01-30 (×27): 5000 [IU] via SUBCUTANEOUS
  Filled 2016-01-21 (×27): qty 1

## 2016-01-21 MED ORDER — DEXTROSE-NACL 5-0.45 % IV SOLN
INTRAVENOUS | Status: DC
  Administered 2016-01-21 – 2016-01-23 (×6): via INTRAVENOUS

## 2016-01-21 MED ORDER — CETYLPYRIDINIUM CHLORIDE 0.05 % MT LIQD
7.0000 mL | Freq: Two times a day (BID) | OROMUCOSAL | Status: DC
  Administered 2016-01-22 – 2016-01-30 (×17): 7 mL via OROMUCOSAL

## 2016-01-21 MED ORDER — SODIUM CHLORIDE 0.9 % IV BOLUS (SEPSIS)
500.0000 mL | Freq: Once | INTRAVENOUS | Status: AC
  Administered 2016-01-21: 500 mL via INTRAVENOUS

## 2016-01-21 MED ORDER — CHLORHEXIDINE GLUCONATE 0.12 % MT SOLN
15.0000 mL | Freq: Two times a day (BID) | OROMUCOSAL | Status: DC
  Administered 2016-01-22 – 2016-01-31 (×18): 15 mL via OROMUCOSAL
  Filled 2016-01-21 (×16): qty 15

## 2016-01-21 MED ORDER — JEVITY 1.5 CAL/FIBER PO LIQD
1000.0000 mL | ORAL | Status: DC
  Administered 2016-01-21 – 2016-01-22 (×2): 1000 mL
  Filled 2016-01-21 (×3): qty 1000

## 2016-01-21 NOTE — Progress Notes (Signed)
Patient Demographics  [name], is a 50 y.o. female, DOB - [birth year], OIN:[id number]  Admit date - 01/20/2016   Admitting Physician Robbie Lis, MD  Outpatient Primary MD for the patient is Arnette Norris, MD  LOS - 1   Chief Complaint  Patient presents with  . Fever       Admission HPI/Brief narrative: 50 year old female with past medical history of CVA, non verbal, has feeding tube, seizure disorder, recent hospitalization (12/03/2015 - 12/08/2015) for sepsis, UTI due to ESBL, subsequent admission for sepsis and GI bleed, she presents to [facility] ED from SNF for fever, workup was significant for UTI.  Subjective:   [name] is nonverbal, cannot give any complaints, but she has diarrhea, and MAXIMUM TEMPERATURE 100.5 overnight.  Assessment & Plan    Principal Problem:   Sepsis secondary to UTI [facility]) Active Problems:   Hx of ischemic right ACA stroke   History of ischemic left ACA stroke   Decubitus ulcer of sacral region, stage 4 (HCC)   Normocytic anemia   S/P percutaneous endoscopic gastrostomy (PEG) tube placement (HCC)   UTI (urinary tract infection) due to urinary indwelling Foley catheter (HCC)   Leukocytosis   FTT (failure to thrive) in adult   Essential hypertension   Seizure disorder (HCC)   Hypernatremia  Sepsis secondary to UTI ([place]) / UTI (urinary tract infection) due to urinary indwelling Foley catheter (HCC) / Leukocytosis - Sepsis criteria met  on admission with fever, tachycardia, tachypnea, hypotension, leukocytosis nad evidence of infection based on UA - Lactic acid 2.5, procalcitonin level 0.15 - Started meropenem and vanco due to history of ESBL, will DC vancomycin in 24 hours if cultures remains negative - Follow up blood culture and urine culture results - Monitor in SDU - Continue IV fluids . - Patient with sacral pressure ulcer, wound looks clean no  evidence of infection. - Patient with diarrhea, C. difficile negative   Hx of ischemic ACA stroke / S/P percutaneous endoscopic gastrostomy (PEG) tube placement (HCC) / FTT (failure to thrive) in adult - Continue peg tube feeds    Decubitus ulcer of sacral region, stage 4 ([place]) - WOC consulted  - Stage IV, managed with wound vac  - Presented with wound vac dressings in place, but no wound vac  - Continue wound VAC, continue Foley     Normocytic anemia - Baseline Hgb 8.4,  7.7 on admission, Transfused 1 U PRBC , Hgb  8.6 this am.    Essential hypertension - Metoprolol on hold due to hypotension  Seizure disorder (HCC) - No reports of seizures so far - Continue Vimpat and Keppra  Hypernatremia - Likely dehydration, sepsis - Continue water flushes for peg tube feeds  - Changed NS to D5 1/2 NS.   Functional quadriplegia - In the setting of chronic illness - Will return to SNF once medically stable for discharge   Hypernatremia  - Resolved   Code Status: FUll  Family Communication: None at bedside  Disposition Plan: Remains in stepdown   Procedures  none   Consults   none   Medications  Scheduled Meds: . sodium chloride   Intravenous Once  . acetaminophen  650 mg Per Tube Daily  .  antiseptic oral rinse  7 mL Mouth Rinse BID  . chlorhexidine  15 mL Mouth Rinse BID  . cholecalciferol  5,000 Units Oral Q breakfast  . cholestyramine light  4 g Oral TID  . feeding supplement (PRO-STAT SUGAR FREE 64)  60 mL Per Tube BID  . ferrous sulfate  300 mg Per Tube BID WC  . free water  200 mL Oral 3 times per day  . lacosamide  100 mg Per Tube BID  . levETIRAcetam  1,500 mg Per Tube BID  . meropenem (MERREM) IV  1 g Intravenous Q8H  . multivitamin with minerals  1 tablet Per Tube Q breakfast  . polyethylene glycol  17 g Per Tube Daily  . polyvinyl alcohol  2 drop Both Eyes TID  . ranitidine  150 mg Per Tube BID  . sodium chloride flush  3 mL Intravenous  Q12H  . vancomycin  1,000 mg Intravenous Q12H  . vitamin C  1,000 mg Oral Daily   Continuous Infusions: . dextrose 5 % and 0.45% NaCl 50 mL/hr at 01/21/16 0831  . feeding supplement (JEVITY 1.5 CAL/FIBER) 1,000 mL (01/21/16 1127)   PRN Meds:.acetaminophen **OR** acetaminophen, bisacodyl, loperamide, ondansetron, traZODone  DVT Prophylaxis  Heparin   Lab Results  Component Value Date   PLT 277 01/21/2016    Antibiotics    Anti-infectives    Start     Dose/Rate Route Frequency Ordered Stop   01/21/16 0200  vancomycin (VANCOCIN) IVPB 1000 mg/200 mL premix     1,000 mg 200 mL/hr over 60 Minutes Intravenous Every 12 hours 01/20/16 1243     01/20/16 1800  meropenem (MERREM) 1 g in sodium chloride 0.9 % 100 mL IVPB     1 g 200 mL/hr over 30 Minutes Intravenous Every 8 hours 01/20/16 1243     01/20/16 1230  piperacillin-tazobactam (ZOSYN) IVPB 3.375 g     3.375 g 100 mL/hr over 30 Minutes Intravenous  Once 01/20/16 1217 01/20/16 1255   01/20/16 1230  vancomycin (VANCOCIN) IVPB 1000 mg/200 mL premix     1,000 mg 200 mL/hr over 60 Minutes Intravenous  Once 01/20/16 1217 01/20/16 1343          Objective:   Filed Vitals:   01/21/16 0200 01/21/16 0310 01/21/16 0400 01/21/16 0800  BP: 99/73 95/59 102/62   Pulse: 132  125   Temp:  99.1 F (37.3 C) 99.9 F (37.7 C) 99 F (37.2 C)  TempSrc:  Oral Axillary Axillary  Resp: 57  36   Height:      Weight:   81.4 kg (179 lb 7.3 oz)   SpO2: 98%  98%     Wt Readings from Last 3 Encounters:  01/21/16 81.4 kg (179 lb 7.3 oz)  01/11/16 80.1 kg (176 lb 9.4 oz)  12/09/15 85.8 kg (189 lb 2.5 oz)     Intake/Output Summary (Last 24 hours) at 01/21/16 1151 Last data filed at 01/21/16 0700  Gross per 24 hour  Intake   2365 ml  Output   1251 ml  Net   1114 ml     Physical Exam  Awake, Noncommunicative, chronically ill-appearing Supple Neck,No JVD,  Symmetrical Chest wall movement, Good air movement bilaterally, CTAB RRR,No  Gallops,Rubs or new Murmurs, No Parasternal Heave +ve B.Sounds, Abd Soft, No tenderness,PEG + No Cyanosis, Clubbing or edema, stage IV sacral pressure ulcer,   Data Review   Micro Results Recent Results (from the past 240 hour(s))  MRSA PCR Screening  Status: None   Collection Time: 01/20/16  6:56 PM  Result Value Ref Range Status   MRSA by PCR NEGATIVE NEGATIVE Final    Comment:        The GeneXpert MRSA Assay (FDA approved for NASAL specimens only), is one component of a comprehensive MRSA colonization surveillance program. It is not intended to diagnose MRSA infection nor to guide or monitor treatment for MRSA infections.   C difficile quick scan w PCR reflex     Status: None   Collection Time: 01/21/16  9:01 AM  Result Value Ref Range Status   C Diff antigen NEGATIVE NEGATIVE Final   C Diff toxin NEGATIVE NEGATIVE Final   C Diff interpretation Negative for toxigenic C. difficile  Final    Radiology Reports Ct Angio Chest Pe W/cm &/or Wo Cm  01/08/2016  CLINICAL DATA:  50 year old female with stroke. Concern for pulmonary embolism. EXAM: CT ANGIOGRAPHY CHEST WITH CONTRAST TECHNIQUE: Multidetector CT imaging of the chest was performed using the standard protocol during bolus administration of intravenous contrast. Multiplanar CT image reconstructions and MIPs were obtained to evaluate the vascular anatomy. CONTRAST:  55m OMNIPAQUE IOHEXOL 350 MG/ML SOLN COMPARISON:  Chest radiograph dated 01/03/2016 FINDINGS: The lungs are clear. There is no pleural effusion or pneumothorax. The central airways are patent. The thoracic aorta appears unremarkable. Evaluation of the pulmonary arteries is limited due to suboptimal opacification of the distal branches as well as streak artifact caused by patient's arms. No definite central pulmonary artery embolus identified. Top-normal cardiac size. No pericardial effusion. There is no hilar or mediastinal adenopathy. The esophagus is grossly  unremarkable. No thyroid nodules identified. There is no axillary adenopathy. The chest wall soft tissues appear unremarkable. There is mild degenerative changes of the spine. No acute fracture. The visualized upper abdomen appears unremarkable. Review of the MIP images confirms the above findings. IMPRESSION: No acute intrathoracic pathology. No CT evidence of central pulmonary artery embolus. Electronically Signed   By: AAnner CreteM.D.   On: 01/08/2016 00:55   Dg Chest Port 1 View  01/20/2016  CLINICAL DATA:  Fever for 1 day EXAM: PORTABLE CHEST 1 VIEW COMPARISON:  Chest radiograph January 03, 2016; chest CT January 08, 2016 FINDINGS: There is no edema or consolidation. Heart size and pulmonary vascularity are normal. No adenopathy. No bone lesions. IMPRESSION: No edema or consolidation. Electronically Signed   By: WLowella GripIII M.D.   On: 01/20/2016 12:11   Dg Chest Port 1 View  01/03/2016  CLINICAL DATA:  50year old female with sepsis and tachypnea EXAM: PORTABLE CHEST 1 VIEW COMPARISON:  Radiograph dated 12/04/2015 FINDINGS: The right-sided PICC is retracted and the tip now lies in the proximal right upper extremity likely in the axillary or brachial vein. Recommend evaluation and adjustment. Single-view of the chest does not demonstrate a focal consolidation. The lungs are hypovolemic. There is no pleural effusion or pneumothorax. Stable cardiac silhouette. No acute osseous pathology. IMPRESSION: No acute cardiopulmonary process. Retracted right-sided PICC with tip in the proximal right arm. Recommend re-evaluation and repositioning. Electronically Signed   By: AAnner CreteM.D.   On: 01/03/2016 18:48     CBC  Recent Labs Lab 01/20/16 1147 01/20/16 1524 01/21/16 0518  WBC 15.4* 12.4* 11.3*  HGB 8.9* 7.7* 8.6*  HCT 30.8* 26.3* 29.1*  PLT 351 294 277  MCV 92.5 89.2 93.3  MCH 26.7 26.1 27.6  MCHC 28.9* 29.3* 29.6*  RDW 17.7* 17.6* 17.6*  LYMPHSABS 3.7 2.7  --  MONOABS  1.1* 0.9  --   EOSABS 0.0 0.0  --   BASOSABS 0.0 0.0  --     Chemistries   Recent Labs Lab 01/20/16 1147 01/20/16 1524 01/21/16 0518  NA 157* 158* 155*  K 4.8 3.9 3.9  CL 120* 125* 127*  CO2 24 23 21*  GLUCOSE 125* 118* 137*  BUN 62* 50* 43*  CREATININE 1.08* 0.93 0.61  CALCIUM 9.8 8.8* 8.8*  MG  --  2.8*  --   AST 54* 44* 26  ALT 76* 63* 50  ALKPHOS 186* 151* 125  BILITOT 0.6 0.6 0.7   ------------------------------------------------------------------------------------------------------------------ estimated creatinine clearance is 90.7 mL/min (by C-G formula based on Cr of 0.61). ------------------------------------------------------------------------------------------------------------------ No results for input(s): HGBA1C in the last 72 hours. ------------------------------------------------------------------------------------------------------------------ No results for input(s): CHOL, HDL, LDLCALC, TRIG, CHOLHDL, LDLDIRECT in the last 72 hours. ------------------------------------------------------------------------------------------------------------------  Recent Labs  01/20/16 1524  TSH 1.194   ------------------------------------------------------------------------------------------------------------------ No results for input(s): VITAMINB12, FOLATE, FERRITIN, TIBC, IRON, RETICCTPCT in the last 72 hours.  Coagulation profile  Recent Labs Lab 01/20/16 1524  INR 1.26    No results for input(s): DDIMER in the last 72 hours.  Cardiac Enzymes No results for input(s): CKMB, TROPONINI, MYOGLOBIN in the last 168 hours.  Invalid input(s): CK ------------------------------------------------------------------------------------------------------------------ Invalid input(s): POCBNP     Time Spent in minutes   35 minutes   Jeniah Kishi M.D on 01/21/2016 at 11:51 AM  Between 7am to 7pm - Pager - [phone]  After 7pm go to [email or web address] -  password [facility]  [facility]   Office  [phone]

## 2016-01-22 DIAGNOSIS — R7881 Bacteremia: Secondary | ICD-10-CM

## 2016-01-22 LAB — BASIC METABOLIC PANEL
Anion gap: 8 (ref 5–15)
Anion gap: 8 (ref 5–15)
BUN: 28 mg/dL — AB (ref 6–20)
BUN: 24 mg/dL — AB (ref 6–20)
CALCIUM: 8.7 mg/dL — AB (ref 8.9–10.3)
CO2: 21 mmol/L — ABNORMAL LOW (ref 22–32)
CO2: 20 mmol/L — ABNORMAL LOW (ref 22–32)
CREATININE: 0.51 mg/dL (ref 0.44–1.00)
Calcium: 8.6 mg/dL — ABNORMAL LOW (ref 8.9–10.3)
Chloride: 124 mmol/L — ABNORMAL HIGH (ref 101–111)
Chloride: 123 mmol/L — ABNORMAL HIGH (ref 101–111)
Creatinine, Ser: 0.63 mg/dL (ref 0.44–1.00)
GFR calc Af Amer: >60 mL/min (ref >60)
GFR calc Af Amer: >60 mL/min (ref >60)
GLUCOSE: 128 mg/dL — AB (ref 65–99)
Glucose, Bld: 133 mg/dL — ABNORMAL HIGH (ref 65–99)
Potassium: 4.2 mmol/L (ref 3.5–5.1)
Potassium: 3.4 mmol/L — ABNORMAL LOW (ref 3.5–5.1)
SODIUM: 151 mmol/L — AB (ref 135–145)
Sodium: 153 mmol/L — ABNORMAL HIGH (ref 135–145)

## 2016-01-22 LAB — CBC
HCT: 27.2 % — ABNORMAL LOW (ref 36.0–46.0)
Hemoglobin: 8.1 g/dL — ABNORMAL LOW (ref 12.0–15.0)
MCH: 27.7 pg (ref 26.0–34.0)
MCHC: 29.8 g/dL — AB (ref 30.0–36.0)
MCV: 93.2 fL (ref 78.0–100.0)
PLATELETS: 262 10*3/uL (ref 150–400)
RBC: 2.92 MIL/uL — ABNORMAL LOW (ref 3.87–5.11)
RDW: 17.6 % — AB (ref 11.5–15.5)
WBC: 5.8 10*3/uL (ref 4.0–10.5)

## 2016-01-22 LAB — GLUCOSE, CAPILLARY: GLUCOSE-CAPILLARY: 110 mg/dL — AB (ref 65–99)

## 2016-01-22 MED ORDER — JEVITY 1.5 CAL/FIBER PO LIQD
1000.0000 mL | ORAL | Status: DC
  Administered 2016-01-23 – 2016-01-27 (×5): 1000 mL
  Filled 2016-01-22 (×7): qty 1000

## 2016-01-22 MED ORDER — DEXTROSE 5 % IV SOLN
1.0000 g | INTRAVENOUS | Status: DC
  Administered 2016-01-22: 1 g via INTRAVENOUS
  Filled 2016-01-22 (×2): qty 10

## 2016-01-22 NOTE — Progress Notes (Signed)
Initial Nutrition Assessment  DOCUMENTATION CODES:   Obesity unspecified  INTERVENTION:  -Decrease Jevity 1.5 to 5545mL/hr -Maintain Pro-Stat QID, each supplement provides 100 calories, 15g protein -Tubefeeding regimen provides 2050 calories, 130g protein, 836cc free water -If not receiving IV fluids, recommend 200cc free water Q4H  NUTRITION DIAGNOSIS:   Increased nutrient needs related to wound healing as evidenced by estimated needs.  GOAL:   Patient will meet greater than or equal to 90% of their needs  MONITOR:   Labs, I & O's, TF tolerance, Skin, Weight trends  REASON FOR ASSESSMENT:   Consult Assessment of nutrition requirement/status  ASSESSMENT:   50 year old female with past medical history of CVA, non verbal, has feeding tube, seizure disorder, recent hospitalization (12/03/2015 - 12/08/2015) for sepsis, UTI due to ESBL, subsequent admission for sepsis and GI bleed. [facility] fevers. Pt is not able to provide any history. No respiratory distress. No falls. No loss of consciousness.  RD consulted for tubefeeding management. Visited pt at bedside. She is non-verbal. No family available. Unable to retrieve any history. Patient has PEG. She is currently suffering from Sepsis r/t UTI She also has a Stg IV decubitus ulcer in desperate need of healing.   Nutrition-Focused physical exam completed. Findings are no fat depletion, no muscle depletion, and no edema.   Labs: Na 153, Mg 2.8 Medications: D5 Drip  -> 510 calories  Diet Order:  Diet regular Room service appropriate?: Yes; Fluid consistency:: Thin  Skin:   Stg IV decubitus ulcer  Last BM:  3/19  Height:   Ht Readings from Last 1 Encounters:  01/20/16 5\' 5"  (1.651 m)    Weight:   Wt Readings from Last 1 Encounters:  01/22/16 188 lb 0.8 oz (85.3 kg)    Ideal Body Weight:  54.5 kg  BMI:  Body mass index is 31.29 kg/(m^2).  Estimated Nutritional Needs:   Kcal:   2000-2150 calories (33-35 cal/kg ABW)  Protein:  130-170 grams (1.5 - 2gm/kg)  Fluid:  >/= 2L  EDUCATION NEEDS:   No education needs identified at this time  Dionne AnoWilliam M. Kazim Corrales, MS, RD LDN After Hours/Weekend Pager [phone]

## 2016-01-22 NOTE — Progress Notes (Signed)
Patient Demographics  [name], is a 50 y.o. female, DOB - 07/28/1966, PTY:[id number]  Admit date - 01/20/2016   Admitting Physician Robbie Lis, MD  Outpatient Primary MD for the patient is Arnette Norris, MD  LOS - 2   Chief Complaint  Patient presents with  . Fever       Admission HPI/Brief narrative: 50 year old female with past medical history of CVA, non verbal, has feeding tube, seizure disorder, recent hospitalization (12/03/2015 - 12/08/2015) for sepsis, UTI due to ESBL, subsequent admission for sepsis and GI bleed, she presents to [facility] ED from SNF for fever, workup was significant for UTI and bacteremia.  Subjective:   [name] is nonverbal, cannot give any complaints, diarrhea significantly improved, and MAXIMUM TEMPERATURE 101.1 overnight.  Assessment & Plan    Principal Problem:   Sepsis secondary to UTI [facility]) Active Problems:   Hx of ischemic right ACA stroke   History of ischemic left ACA stroke   Decubitus ulcer of sacral region, stage 4 (HCC)   Normocytic anemia   S/P percutaneous endoscopic gastrostomy (PEG) tube placement (HCC)   UTI (urinary tract infection) due to urinary indwelling Foley catheter (HCC)   Leukocytosis   FTT (failure to thrive) in adult   Essential hypertension   Seizure disorder (HCC)   Hypernatremia  Sepsis secondary to UTI /bacteremia - Sepsis criteria met  on admission with fever, tachycardia, tachypnea, hypotension, leukocytosis nad evidence of infection based on UA - Lactic acid 2.5, procalcitonin level 0.15 - Started meropenem and vanco due to history of ESBL, urine culture growing Proteus mirabilis sensitive to cephalosporin, so will change meropenem to cephalosporins, continue with vancomycin giving blood culture showing Staphylococcus guaiac-negative. - Urine culture growing Proteus mirabilis, sensitive to ceftriaxone - Blood  culture growing 2 out of 2 Staphylococcus coag negative, continue with vancomycin, await sensitivity - Monitor in SDU - Continue IV fluids . - Patient with sacral pressure ulcer, wound looks clean no evidence of infection. - Patient with diarrhea, C. difficile negative   Hx of ischemic ACA stroke / S/P percutaneous endoscopic gastrostomy (PEG) tube placement (HCC) / FTT (failure to thrive) in adult - Continue peg tube feeds  - Patient is not on any antiplatelet therapy most likely due to history of GI bleed in the past, and remote intracranial hemorrhage in the past.   Decubitus ulcer of sacral region, stage 4 ([place]) - WOC consulted  - Stage IV, managed with wound vac  - Presented with wound vac dressings in place, but no wound vac  - Continue wound VAC, continue Foley   Normocytic anemia - Baseline Hgb 8.4,  7.7 on admission, Transfused 1 U PRBC , Hgb  8.1 this am, certainly dilutional factor given IV fluids.  Essential hypertension - Metoprolol on hold due to hypotension  Seizure disorder (HCC) - No reports of seizures so far - Continue Vimpat and Keppra  Hypernatremia - Likely dehydration, sepsis - Continue water flushes for peg tube feeds  - Increase D5 half-normal saline given tachycardia, dehydration and hypernatremia, recheck BMP this afternoon to avoid rapid correction.   Functional quadriplegia - In the setting of chronic illness - Will return to SNF once medically stable for discharge  Code Status: Full  Family Communication: None at bedside  Disposition Plan: Remains in stepdown   Procedures  none   Consults   none   Medications  Scheduled Meds: . sodium chloride   Intravenous Once  . acetaminophen  650 mg Per Tube Daily  . antiseptic oral rinse  7 mL Mouth Rinse q12n4p  . chlorhexidine  15 mL Mouth Rinse BID  . cholecalciferol  5,000 Units Oral Q breakfast  . cholestyramine light  4 g Oral TID  . feeding supplement (PRO-STAT SUGAR FREE  64)  60 mL Per Tube BID  . ferrous sulfate  300 mg Per Tube BID WC  . free water  200 mL Oral 3 times per day  . heparin subcutaneous  5,000 Units Subcutaneous 3 times per day  . lacosamide  100 mg Per Tube BID  . levETIRAcetam  1,500 mg Per Tube BID  . meropenem (MERREM) IV  1 g Intravenous Q8H  . multivitamin with minerals  1 tablet Per Tube Q breakfast  . polyethylene glycol  17 g Per Tube Daily  . polyvinyl alcohol  2 drop Both Eyes TID  . ranitidine  150 mg Per Tube BID  . sodium chloride flush  3 mL Intravenous Q12H  . vancomycin  1,000 mg Intravenous Q12H  . vitamin C  1,000 mg Oral Daily   Continuous Infusions: . dextrose 5 % and 0.45% NaCl 75 mL/hr at 01/22/16 0210  . feeding supplement (JEVITY 1.5 CAL/FIBER) 1,000 mL (01/22/16 0838)   PRN Meds:.acetaminophen **OR** acetaminophen, bisacodyl, loperamide, ondansetron, traZODone  DVT Prophylaxis  Heparin   Lab Results  Component Value Date   PLT 262 01/22/2016    Antibiotics    Anti-infectives    Start     Dose/Rate Route Frequency Ordered Stop   01/21/16 0200  vancomycin (VANCOCIN) IVPB 1000 mg/200 mL premix     1,000 mg 200 mL/hr over 60 Minutes Intravenous Every 12 hours 01/20/16 1243     01/20/16 1800  meropenem (MERREM) 1 g in sodium chloride 0.9 % 100 mL IVPB     1 g 200 mL/hr over 30 Minutes Intravenous Every 8 hours 01/20/16 1243     01/20/16 1230  piperacillin-tazobactam (ZOSYN) IVPB 3.375 g     3.375 g 100 mL/hr over 30 Minutes Intravenous  Once 01/20/16 1217 01/20/16 1255   01/20/16 1230  vancomycin (VANCOCIN) IVPB 1000 mg/200 mL premix     1,000 mg 200 mL/hr over 60 Minutes Intravenous  Once 01/20/16 1217 01/20/16 1343          Objective:   Filed Vitals:   01/22/16 0521 01/22/16 0600 01/22/16 0700 01/22/16 0800  BP:  133/74 127/74 125/83  Pulse: 122 113 116 110  Temp: 100.1 F (37.8 C)   97.4 F (36.3 C)  TempSrc: Oral   Axillary  Resp: _0 Height:      Weight:      SpO2: 100%  98% 100% 100%    Wt Readings from Last 3 Encounters:  01/22/16 85.3 kg (188 lb 0.8 oz)  01/11/16 80.1 kg (176 lb 9.4 oz)  12/09/15 85.8 kg (189 lb 2.5 oz)     Intake/Output Summary (Last 24 hours) at 01/22/16 1203 Last data filed at 01/22/16 0845  Gross per 24 hour  Intake   1890 ml  Output   1665 ml  Net    225 ml     Physical Exam  Awake, Noncommunicative, chronically ill-appearing Supple Neck,No  JVD,  Symmetrical Chest wall movement, Good air movement bilaterally, CTAB RRR,No Gallops,Rubs or new Murmurs, No Parasternal Heave +ve B.Sounds, Abd Soft, No tenderness,PEG + No Cyanosis, Clubbing or edema, stage IV sacral pressure ulcer,   Data Review   Micro Results Recent Results (from the past 240 hour(s))  Blood Culture (routine x 2)     Status: None (Preliminary result)   Collection Time: 01/20/16 11:47 AM  Result Value Ref Range Status   Specimen Description BLOOD RIGHT FOREARM  Final   Special Requests BOTTLES DRAWN AEROBIC AND ANAEROBIC 5ML  Final   Culture  Setup Time   Final    GRAM POSITIVE COCCI IN CLUSTERS CRITICAL RESULT CALLED TO, READ BACK BY AND VERIFIED WITH: S DILLAN,RNA AT 1156 01/21/16 BY L BENFIELD IN BOTH AEROBIC AND ANAEROBIC BOTTLES    Culture   Final    STAPHYLOCOCCUS SPECIES (COAGULASE NEGATIVE) SUSCEPTIBILITIES TO FOLLOW Performed at [facility]    Report Status PENDING  Incomplete  Urine culture     Status: None (Preliminary result)   Collection Time: 01/20/16 11:57 AM  Result Value Ref Range Status   Specimen Description URINE, CATHETERIZED  Final   Special Requests NONE  Final   Culture   Final    >=100,000 COLONIES/mL GRAM NEGATIVE RODS 50,000 COLONIES/mL PROTEUS MIRABILIS Performed at [facility]    Report Status PENDING  Incomplete   Organism ID, Bacteria PROTEUS MIRABILIS  Final      Susceptibility   Proteus mirabilis - MIC*    AMPICILLIN <=2 SENSITIVE Sensitive     CEFAZOLIN <=4 SENSITIVE Sensitive      CEFTRIAXONE <=1 SENSITIVE Sensitive     CIPROFLOXACIN >=4 RESISTANT Resistant     GENTAMICIN <=1 SENSITIVE Sensitive     IMIPENEM 4 SENSITIVE Sensitive     NITROFURANTOIN 64 RESISTANT Resistant     TRIMETH/SULFA <=20 SENSITIVE Sensitive     AMPICILLIN/SULBACTAM <=2 SENSITIVE Sensitive     PIP/TAZO <=4 SENSITIVE Sensitive     * 50,000 COLONIES/mL PROTEUS MIRABILIS  Blood Culture (routine x 2)     Status: None (Preliminary result)   Collection Time: 01/20/16 12:06 PM  Result Value Ref Range Status   Specimen Description BLOOD RIGHT ANTECUBITAL  Final   Special Requests BOTTLES DRAWN AEROBIC AND ANAEROBIC 5ML  Final   Culture  Setup Time   Final    GRAM POSITIVE COCCI IN CLUSTERS AEROBIC BOTTLE CALLED TO SHEEK,S RN 01/21/16 1439 [place] BY V WILKINS    Culture   Final    STAPHYLOCOCCUS SPECIES (COAGULASE NEGATIVE) Performed at [name]    Report Status PENDING  Incomplete  Culture, blood (x 2)     Status: None (Preliminary result)   Collection Time: 01/20/16  3:24 PM  Result Value Ref Range Status   Specimen Description BLOOD LEFT ARM  Final   Special Requests IN PEDIATRIC BOTTLE 4CC  Final   Culture   Final    NO GROWTH < 24 HOURS Performed at [facility]    Report Status PENDING  Incomplete  Culture, blood (x 2)     Status: None (Preliminary result)   Collection Time: 01/20/16  3:24 PM  Result Value Ref Range Status   Specimen Description BLOOD LEFT ARM  Final   Special Requests IN PEDIATRIC BOTTLE 4CC  Final   Culture   Final    NO GROWTH < 24 HOURS Performed at [facility]    Report Status PENDING  Incomplete  MRSA PCR Screening     Status: None   Collection Time: 01/20/16  6:56 PM  Result Value Ref Range Status   MRSA by PCR NEGATIVE NEGATIVE Final    Comment:        The GeneXpert MRSA Assay (FDA approved for NASAL specimens only), is one component of a comprehensive MRSA colonization surveillance program. It is  not intended to diagnose MRSA infection nor to guide or monitor treatment for MRSA infections.   Urine culture     Status: None (Preliminary result)   Collection Time: 01/20/16  7:15 PM  Result Value Ref Range Status   Specimen Description URINE, CLEAN CATCH  Final   Special Requests NONE  Final   Culture   Final    >=100,000 COLONIES/mL GRAM NEGATIVE RODS Performed at [facility]    Report Status PENDING  Incomplete  C difficile quick scan w PCR reflex     Status: None   Collection Time: 01/21/16  9:01 AM  Result Value Ref Range Status   C Diff antigen NEGATIVE NEGATIVE Final   C Diff toxin NEGATIVE NEGATIVE Final   C Diff interpretation Negative for toxigenic C. difficile  Final    Radiology Reports Ct Angio Chest Pe W/cm &/or Wo Cm  01/08/2016  CLINICAL DATA:  50 year old female with stroke. Concern for pulmonary embolism. EXAM: CT ANGIOGRAPHY CHEST WITH CONTRAST TECHNIQUE: Multidetector CT imaging of the chest was performed using the standard protocol during bolus administration of intravenous contrast. Multiplanar CT image reconstructions and MIPs were obtained to evaluate the vascular anatomy. CONTRAST:  84m OMNIPAQUE IOHEXOL 350 MG/ML SOLN COMPARISON:  Chest radiograph dated 01/03/2016 FINDINGS: The lungs are clear. There is no pleural effusion or pneumothorax. The central airways are patent. The thoracic aorta appears unremarkable. Evaluation of the pulmonary arteries is limited due to suboptimal opacification of the distal branches as well as streak artifact caused by patient's arms. No definite central pulmonary artery embolus identified. Top-normal cardiac size. No pericardial effusion. There is no hilar or mediastinal adenopathy. The esophagus is grossly unremarkable. No thyroid nodules identified. There is no axillary adenopathy. The chest wall soft tissues appear unremarkable. There is mild degenerative changes of the spine. No acute fracture. The visualized upper  abdomen appears unremarkable. Review of the MIP images confirms the above findings. IMPRESSION: No acute intrathoracic pathology. No CT evidence of central pulmonary artery embolus. Electronically Signed   By: AAnner CreteM.D.   On: 01/08/2016 00:55   Dg Chest Port 1 View  01/20/2016  CLINICAL DATA:  Fever for 1 day EXAM: PORTABLE CHEST 1 VIEW COMPARISON:  Chest radiograph January 03, 2016; chest CT January 08, 2016 FINDINGS: There is no edema or consolidation. Heart size and pulmonary vascularity are normal. No adenopathy. No bone lesions. IMPRESSION: No edema or consolidation. Electronically Signed   By: WLowella GripIII M.D.   On: 01/20/2016 12:11   Dg Chest Port 1 View  01/03/2016  CLINICAL DATA:  50year old female with sepsis and tachypnea EXAM: PORTABLE CHEST 1 VIEW COMPARISON:  Radiograph dated 12/04/2015 FINDINGS: The right-sided PICC is retracted and the tip now lies in the proximal right upper extremity likely in the axillary or brachial vein. Recommend evaluation and adjustment. Single-view of the chest does not demonstrate a focal consolidation. The lungs are hypovolemic. There is no pleural effusion or pneumothorax. Stable cardiac silhouette. No acute osseous pathology. IMPRESSION: No acute cardiopulmonary process. Retracted right-sided PICC with tip in the proximal right arm.  Recommend re-evaluation and repositioning. Electronically Signed   By: Anner Crete M.D.   On: 01/03/2016 18:48     CBC  Recent Labs Lab 01/20/16 1147 01/20/16 1524 01/21/16 0518 01/22/16 0717  WBC 15.4* 12.4* 11.3* 5.8  HGB 8.9* 7.7* 8.6* 8.1*  HCT 30.8* 26.3* 29.1* 27.2*  PLT 351 294 277 262  MCV 92.5 89.2 93.3 93.2  MCH 26.7 26.1 27.6 27.7  MCHC 28.9* 29.3* 29.6* 29.8*  RDW 17.7* 17.6* 17.6* 17.6*  LYMPHSABS 3.7 2.7  --   --   MONOABS 1.1* 0.9  --   --   EOSABS 0.0 0.0  --   --   BASOSABS 0.0 0.0  --   --     Chemistries   Recent Labs Lab 01/20/16 1147 01/20/16 1524  01/21/16 0518 01/22/16 0717  NA 157* 158* 155* 153*  K 4.8 3.9 3.9 4.2  CL 120* 125* 127* 124*  CO2 24 23 21* 21*  GLUCOSE 125* 118* 137* 128*  BUN 62* 50* 43* 28*  CREATININE 1.08* 0.93 0.61 0.63  CALCIUM 9.8 8.8* 8.8* 8.7*  MG  --  2.8*  --   --   AST 54* 44* 26  --   ALT 76* 63* 50  --   ALKPHOS 186* 151* 125  --   BILITOT 0.6 0.6 0.7  --    ------------------------------------------------------------------------------------------------------------------ estimated creatinine clearance is 92.7 mL/min (by C-G formula based on Cr of 0.63). ------------------------------------------------------------------------------------------------------------------ No results for input(s): HGBA1C in the last 72 hours. ------------------------------------------------------------------------------------------------------------------ No results for input(s): CHOL, HDL, LDLCALC, TRIG, CHOLHDL, LDLDIRECT in the last 72 hours. ------------------------------------------------------------------------------------------------------------------  Recent Labs  01/20/16 1524  TSH 1.194   ------------------------------------------------------------------------------------------------------------------ No results for input(s): VITAMINB12, FOLATE, FERRITIN, TIBC, IRON, RETICCTPCT in the last 72 hours.  Coagulation profile  Recent Labs Lab 01/20/16 1524  INR 1.26    No results for input(s): DDIMER in the last 72 hours.  Cardiac Enzymes No results for input(s): CKMB, TROPONINI, MYOGLOBIN in the last 168 hours.  Invalid input(s): CK ------------------------------------------------------------------------------------------------------------------ Invalid input(s): POCBNP     Time Spent in minutes   35 minutes   ELGERGAWY, DAWOOD M.D on 01/22/2016 at 12:03 PM  Between 7am to 7pm - Pager - [phone]  After 7pm go to [email or web address] - password [facility]  [facility]   Office   [phone]

## 2016-01-23 LAB — TYPE AND SCREEN
ABO/RH(D): A POS
ANTIBODY SCREEN: NEGATIVE
UNIT DIVISION: 0

## 2016-01-23 LAB — BASIC METABOLIC PANEL
ANION GAP: 10 (ref 5–15)
BUN: 20 mg/dL (ref 6–20)
CO2: 19 mmol/L — ABNORMAL LOW (ref 22–32)
Calcium: 8.6 mg/dL — ABNORMAL LOW (ref 8.9–10.3)
Chloride: 118 mmol/L — ABNORMAL HIGH (ref 101–111)
Creatinine, Ser: 0.5 mg/dL (ref 0.44–1.00)
GLUCOSE: 108 mg/dL — AB (ref 65–99)
POTASSIUM: 3.4 mmol/L — AB (ref 3.5–5.1)
Sodium: 147 mmol/L — ABNORMAL HIGH (ref 135–145)

## 2016-01-23 LAB — CBC
HEMATOCRIT: 26.4 % — AB (ref 36.0–46.0)
Hemoglobin: 8.1 g/dL — ABNORMAL LOW (ref 12.0–15.0)
MCH: 28 pg (ref 26.0–34.0)
MCHC: 30.7 g/dL (ref 30.0–36.0)
MCV: 91.3 fL (ref 78.0–100.0)
PLATELETS: 257 10*3/uL (ref 150–400)
RBC: 2.89 MIL/uL — AB (ref 3.87–5.11)
RDW: 17.2 % — ABNORMAL HIGH (ref 11.5–15.5)
WBC: 6.9 10*3/uL (ref 4.0–10.5)

## 2016-01-23 LAB — URINE CULTURE: Culture: >=100000

## 2016-01-23 LAB — GLUCOSE, CAPILLARY: Glucose-Capillary: 90 mg/dL (ref 65–99)

## 2016-01-23 LAB — VANCOMYCIN, TROUGH: VANCOMYCIN TR: 23 ug/mL — AB (ref 10.0–20.0)

## 2016-01-23 MED ORDER — VANCOMYCIN HCL 10 G IV SOLR
1500.0000 mg | INTRAVENOUS | Status: DC
  Filled 2016-01-23: qty 1500

## 2016-01-23 MED ORDER — POTASSIUM CHLORIDE 20 MEQ/15ML (10%) PO SOLN
20.0000 meq | ORAL | Status: AC
  Administered 2016-01-23 (×3): 20 meq via ORAL
  Filled 2016-01-23 (×2): qty 15

## 2016-01-23 MED ORDER — SODIUM CHLORIDE 0.9 % IV SOLN
1.0000 g | Freq: Three times a day (TID) | INTRAVENOUS | Status: DC
  Administered 2016-01-23: 1 g via INTRAVENOUS
  Filled 2016-01-23 (×5): qty 1

## 2016-01-23 NOTE — Consult Note (Signed)
WOC wound consult note Reason for Consult: Stage 4 pressure injury, chronic.  Wound type:Stage 4 pressure injury.  Fecal management system was leaking and could not be maintained.  Excessive loose stools. Prevalon boots in place Pressure Ulcer POA: Yes Measurement:6.5 cm x 6 cm x 3 cm with undermining circumferentially, extending 2 cm.  Wound WUJ:WJXBbed:pale pink nongranulating Drainage (amount, consistency, odor) Minimal serosanguinous.  No odor noted but strong fecal odor from multiple loose stools.  Periwound:Intact Dressing procedure/placement/frequency:Cleanse wound with NS and pat gently dry.  APply black granufoam to wound bed.  Bridge dressing to hip to offload pressure. Change Mon/Wed/Fri Will not follow at this time.  Please re-consult if needed.  Maple HudsonKaren Kally Cadden RN BSN CWON Pager [phone]

## 2016-01-23 NOTE — Progress Notes (Addendum)
Pharmacy Antibiotic Note  [name] is a 50 y.o. female with hx seizures and ESBLadmitted on 01/20/2016 with sepsis.  Vancomycin and merrem were started on admission.  Merrem switched to ceftriaxone on 3/19 based on sensitivity for proteus in UCX.  Same UCX now back with ESBL Klebsiella.  [facility]  Sensitivity for CoNS in blood cultures still pending.   Plan: -continue vancomycin 1gm IV q12h (goal trough 15-20)  - will check steady state trough level today to assess current regimen - resume Meropenem 1g IV q8h - Follow up renal function & cultures - De-escalate as appropriate  Adden: vancomycin trough now back supra-therapeutic at 23.  Will change dose to 1500 mg IV q24h (next dose due on 3/21 at 0600)  Height: 5\' 5"  (165.1 cm) (taken 3/8 per facility) Weight: 187 lb 6.3 oz (85 kg) IBW/kg (Calculated) : 57  Temp (24hrs), Avg:99.3 F (37.4 C), Min:99.1 F (37.3 C), Max:99.5 F (37.5 C)   Recent Labs Lab 01/20/16 1147 01/20/16 1154 01/20/16 1524 01/20/16 1816 01/21/16 0518 01/22/16 0717 01/22/16 1434 01/23/16 0517  WBC 15.4*  --  12.4*  --  11.3* 5.8  --  6.9  CREATININE 1.08*  --  0.93  --  0.61 0.63 0.51 0.50  LATICACIDVEN  --  2.52* 2.0 1.4  --   --   --   --     Estimated Creatinine Clearance: 92.6 mL/min (by C-G formula based on Cr of 0.5).    Allergies  Allergen Reactions  . Amlodipine Swelling  . Latex Rash    Unknown   . Other Other (See Comments)    Natural Rubber- Unknown     Antimicrobials this admission: 3/17 Zosyn x 1 3/17 >> Vanc >> 3/17 >> Meropenem >>3/19>> resume 3/20>> 3/19 >>Rocephin>> 3/20  Dose adjustments this admission: 1/29 VT =9 about 19 hours after 1250 mg dose (pt was on 1250 mg q12h PTA), changed to 1g q8h 1/31 VT = 39 on 1g q8h, changed to 750 mg q12h 3/20 VT at 1330= 23  (on 1gm q12h) --> change to 1500 mg IV q24h  Microbiology results: 12/04/15 PICC: ESBL Klebsiella 12/04/15 Cath tip:  ESBL Klebsiella and Pseudomonas (pansensitive) 01/03/16 UCx: Proteus mirabilis (R cipro, nitro) 3/17 @ 1147 BCx: 2/2 CoNS 3/17 @1524  BCx: NGTD (*Initial Vanc given at 1237 on 3/17) 3/17 UCx: 50 K Proteus Mirabilis (R- Cipro, Nitrofurantoin); >100K ESBL klebsiella (S= gent, imip, zosyn) 3/18 Cdiff antigen/toxin: neg 3/17 MRSA PCR: neg 3/17 bcx x2: ngtd  Thank you for allowing pharmacy to be a part of this patient's care.  Dorna LeitzAnh Samanthia Howland, PharmD, BCPS 01/23/2016 12:56 PM

## 2016-01-23 NOTE — Progress Notes (Signed)
Patient Demographics  [name], is a 50 y.o. female, DOB - 1966-07-09, AFB:[id number]  Admit date - 01/20/2016   Admitting Physician Robbie Lis, MD  Outpatient Primary MD for the patient is Arnette Norris, MD  LOS - 3   Chief Complaint  Patient presents with  . Fever       Admission HPI/Brief narrative: 50 year old female with past medical history of CVA, non verbal, has feeding tube, seizure disorder, recent hospitalization (12/03/2015 - 12/08/2015) for sepsis, UTI due to ESBL, subsequent admission for sepsis and GI bleed, she presents to [facility] ED from SNF for fever, workup was significant for UTI and bacteremia.  Subjective:   [name] is nonverbal, cannot give any complaints, Still with diarrhea, afebrile over last 24 Hours.  Assessment & Plan    Principal Problem:   Sepsis secondary to UTI [facility]) Active Problems:   Hx of ischemic right ACA stroke   History of ischemic left ACA stroke   Decubitus ulcer of sacral region, stage 4 (HCC)   Normocytic anemia   S/P percutaneous endoscopic gastrostomy (PEG) tube placement (HCC)   UTI (urinary tract infection) due to urinary indwelling Foley catheter (HCC)   Leukocytosis   FTT (failure to thrive) in adult   Essential hypertension   Seizure disorder (HCC)   Hypernatremia   Bacteremia  Sepsis secondary to UTI /bacteremia - Sepsis criteria met  on admission with fever, tachycardia, tachypnea, hypotension, leukocytosis nad evidence of infection based on UA - Lactic acid 2.5, procalcitonin level 0.15 on admission. - Started meropenem and vanco due to history of ESBL, initially transitioned to Rocephin as Proteus mirabilis sensitive to Rocephin, but then urine culture growing Klebsiella pneumonia ESBL, transitioned back to meropenem on 3/20/thousand 17. - Blood culture growing 2 out of 2 Staphylococcus coag negative, continue with  vancomycin, await sensitivity - Continue IV fluids . - Patient with sacral pressure ulcer, wound looks clean no evidence of infection. - Patient with diarrhea, C. difficile negative   Hx of ischemic ACA stroke / S/P percutaneous endoscopic gastrostomy (PEG) tube placement (HCC) / FTT (failure to thrive) in adult - Continue peg tube feeds  - Patient is not on any antiplatelet therapy most likely due to history of GI bleed in the past, and remote intracranial hemorrhage in the past.   Decubitus ulcer of sacral region, stage 4 ([place]) - WOC consulted  - Stage IV, managed with wound vac  - Presented with wound vac dressings in place, but no wound vac  - Continue wound VAC, continue Foley   Normocytic anemia - Baseline Hgb 8.4,  7.7 on admission, Transfused 1 U PRBC , Hgb  8.1 this am, certainly dilutional factor given IV fluids.  Essential hypertension - Metoprolol on hold due to hypotension  Seizure disorder (HCC) - No reports of seizures so far - Continue Vimpat and Keppra  Hypernatremia - Likely dehydration, sepsis - Continue water flushes for peg tube feeds  - Improving and D5 half-normal saline, continue with current rate.   Functional quadriplegia - In the setting of chronic illness - Will return to SNF once medically stable for discharge    Code Status: Full  Family Communication: None at bedside  Disposition Plan: Pending further  clinical course,   Procedures  none   Consults   none   Medications  Scheduled Meds: . sodium chloride   Intravenous Once  . acetaminophen  650 mg Per Tube Daily  . antiseptic oral rinse  7 mL Mouth Rinse q12n4p  . chlorhexidine  15 mL Mouth Rinse BID  . cholecalciferol  5,000 Units Oral Q breakfast  . cholestyramine light  4 g Oral TID  . feeding supplement (PRO-STAT SUGAR FREE 64)  60 mL Per Tube BID  . ferrous sulfate  300 mg Per Tube BID WC  . free water  200 mL Oral 3 times per day  . heparin subcutaneous  5,000  Units Subcutaneous 3 times per day  . lacosamide  100 mg Per Tube BID  . levETIRAcetam  1,500 mg Per Tube BID  . meropenem (MERREM) IV  1 g Intravenous Q8H  . multivitamin with minerals  1 tablet Per Tube Q breakfast  . polyethylene glycol  17 g Per Tube Daily  . polyvinyl alcohol  2 drop Both Eyes TID  . potassium chloride  20 mEq Oral Q4H  . ranitidine  150 mg Per Tube BID  . sodium chloride flush  3 mL Intravenous Q12H  . vancomycin  1,000 mg Intravenous Q12H  . vitamin C  1,000 mg Oral Daily   Continuous Infusions: . dextrose 5 % and 0.45% NaCl 125 mL/hr at 01/23/16 0642  . feeding supplement (JEVITY 1.5 CAL/FIBER) 1,000 mL (01/23/16 8159)   PRN Meds:.acetaminophen **OR** acetaminophen, bisacodyl, loperamide, ondansetron, traZODone  DVT Prophylaxis  Heparin   Lab Results  Component Value Date   PLT 257 01/23/2016    Antibiotics    Anti-infectives    Start     Dose/Rate Route Frequency Ordered Stop   01/23/16 1300  meropenem (MERREM) 1 g in sodium chloride 0.9 % 100 mL IVPB     1 g 200 mL/hr over 30 Minutes Intravenous Every 8 hours 01/23/16 1254     01/22/16 1400  cefTRIAXone (ROCEPHIN) 1 g in dextrose 5 % 50 mL IVPB  Status:  Discontinued     1 g 100 mL/hr over 30 Minutes Intravenous Every 24 hours 01/22/16 1211 01/23/16 1252   01/21/16 0200  vancomycin (VANCOCIN) IVPB 1000 mg/200 mL premix     1,000 mg 200 mL/hr over 60 Minutes Intravenous Every 12 hours 01/20/16 1243     01/20/16 1800  meropenem (MERREM) 1 g in sodium chloride 0.9 % 100 mL IVPB  Status:  Discontinued     1 g 200 mL/hr over 30 Minutes Intravenous Every 8 hours 01/20/16 1243 01/22/16 1211   01/20/16 1230  piperacillin-tazobactam (ZOSYN) IVPB 3.375 g     3.375 g 100 mL/hr over 30 Minutes Intravenous  Once 01/20/16 1217 01/20/16 1255   01/20/16 1230  vancomycin (VANCOCIN) IVPB 1000 mg/200 mL premix     1,000 mg 200 mL/hr over 60 Minutes Intravenous  Once 01/20/16 1217 01/20/16 1343           Objective:   Filed Vitals:   01/22/16 1642 01/22/16 2108 01/23/16 0447 01/23/16 1356  BP: 114/80 126/84 127/78 133/86  Pulse: 117 121 107 102  Temp: 99.5 F (37.5 C) 99.2 F (37.3 C) 99.1 F (37.3 C) 98.2 F (36.8 C)  TempSrc: Axillary Axillary Axillary Axillary  Resp: _0 Height:      Weight:   85 kg (187 lb 6.3 oz)   SpO2: 100% 100% 99% 100%  Wt Readings from Last 3 Encounters:  01/23/16 85 kg (187 lb 6.3 oz)  01/11/16 80.1 kg (176 lb 9.4 oz)  12/09/15 85.8 kg (189 lb 2.5 oz)     Intake/Output Summary (Last 24 hours) at 01/23/16 1403 Last data filed at 01/23/16 1305  Gross per 24 hour  Intake 2517.92 ml  Output   2000 ml  Net 517.92 ml     Physical Exam  Awake, Noncommunicative, chronically ill-appearing Supple Neck,No JVD,  Symmetrical Chest wall movement, Good air movement bilaterally, CTAB RRR,No Gallops,Rubs or new Murmurs, No Parasternal Heave +ve B.Sounds, Abd Soft, No tenderness,PEG + No Cyanosis, Clubbing or edema, stage IV sacral pressure ulcer,   Data Review   Micro Results Recent Results (from the past 240 hour(s))  Blood Culture (routine x 2)     Status: None (Preliminary result)   Collection Time: 01/20/16 11:47 AM  Result Value Ref Range Status   Specimen Description BLOOD RIGHT FOREARM  Final   Special Requests BOTTLES DRAWN AEROBIC AND ANAEROBIC  Final   Culture  Setup Time   Final    GRAM POSITIVE COCCI IN CLUSTERS CRITICAL RESULT CALLED TO, READ BACK BY AND VERIFIED WITH: Tora Kindred AT 1156 01/21/16 BY L BENFIELD IN BOTH AEROBIC AND ANAEROBIC BOTTLES    Culture   Final    STAPHYLOCOCCUS SPECIES (COAGULASE NEGATIVE) SUSCEPTIBILITIES TO FOLLOW Performed at [facility]    Report Status PENDING  Incomplete  Urine culture     Status: None   Collection Time: 01/20/16 11:57 AM  Result Value Ref Range Status   Specimen Description URINE, CATHETERIZED  Final   Special Requests NONE  Final   Culture   Final     >=100,000 COLONIES/mL KLEBSIELLA PNEUMONIAE 50,000 COLONIES/mL PROTEUS MIRABILIS ORGANISM 1 Confirmed Extended Spectrum Beta-Lactamase Producer (ESBL) Performed at [facility]    Report Status 01/23/2016 FINAL  Final   Organism ID, Bacteria PROTEUS MIRABILIS  Final   Organism ID, Bacteria KLEBSIELLA PNEUMONIAE  Final      Susceptibility   Klebsiella pneumoniae - MIC*    AMPICILLIN >=32 RESISTANT Resistant     CEFAZOLIN >=64 RESISTANT Resistant     CEFTRIAXONE >=64 RESISTANT Resistant     CIPROFLOXACIN >=4 RESISTANT Resistant     GENTAMICIN <=1 SENSITIVE Sensitive     IMIPENEM <=0.25 SENSITIVE Sensitive     NITROFURANTOIN 64 INTERMEDIATE Intermediate     TRIMETH/SULFA >=320 RESISTANT Resistant     AMPICILLIN/SULBACTAM >=32 RESISTANT Resistant     PIP/TAZO 16 SENSITIVE Sensitive     * >=100,000 COLONIES/mL KLEBSIELLA PNEUMONIAE   Proteus mirabilis - MIC*    AMPICILLIN <=2 SENSITIVE Sensitive     CEFAZOLIN <=4 SENSITIVE Sensitive     CEFTRIAXONE <=1 SENSITIVE Sensitive     CIPROFLOXACIN >=4 RESISTANT Resistant     GENTAMICIN <=1 SENSITIVE Sensitive     IMIPENEM 4 SENSITIVE Sensitive     NITROFURANTOIN 64 RESISTANT Resistant     TRIMETH/SULFA <=20 SENSITIVE Sensitive     AMPICILLIN/SULBACTAM <=2 SENSITIVE Sensitive     PIP/TAZO <=4 SENSITIVE Sensitive     * 50,000 COLONIES/mL PROTEUS MIRABILIS  Blood Culture (routine x 2)     Status: None (Preliminary result)   Collection Time: 01/20/16 12:06 PM  Result Value Ref Range Status   Specimen Description BLOOD RIGHT ANTECUBITAL  Final   Special Requests BOTTLES DRAWN AEROBIC AND ANAEROBIC  Final   Culture  Setup Time   Final    GRAM  POSITIVE COCCI IN CLUSTERS AEROBIC BOTTLE CALLED TO SHEEK,S RN 01/21/16 1439 [place] BY Raynelle Fanning    Culture   Final    STAPHYLOCOCCUS SPECIES (COAGULASE NEGATIVE) Performed at [facility]    Report Status PENDING  Incomplete  Culture, blood (x 2)     Status: None  (Preliminary result)   Collection Time: 01/20/16  3:24 PM  Result Value Ref Range Status   Specimen Description BLOOD LEFT ARM  Final   Special Requests IN PEDIATRIC BOTTLE 4CC  Final   Culture   Final    NO GROWTH 2 DAYS Performed at [facility]    Report Status PENDING  Incomplete  Culture, blood (x 2)     Status: None (Preliminary result)   Collection Time: 01/20/16  3:24 PM  Result Value Ref Range Status   Specimen Description BLOOD LEFT ARM  Final   Special Requests IN PEDIATRIC BOTTLE 4CC  Final   Culture   Final    NO GROWTH 2 DAYS Performed at [facility]    Report Status PENDING  Incomplete  MRSA PCR Screening     Status: None   Collection Time: 01/20/16  6:56 PM  Result Value Ref Range Status   MRSA by PCR NEGATIVE NEGATIVE Final    Comment:        The GeneXpert MRSA Assay (FDA approved for NASAL specimens only), is one component of a comprehensive MRSA colonization surveillance program. It is not intended to diagnose MRSA infection nor to guide or monitor treatment for MRSA infections.   Urine culture     Status: None (Preliminary result)   Collection Time: 01/20/16  7:15 PM  Result Value Ref Range Status   Specimen Description URINE, CLEAN CATCH  Final   Special Requests NONE  Final   Culture   Final    >=100,000 COLONIES/mL GRAM NEGATIVE RODS IDENTIFICATION AND SUSCEPTIBILITIES TO FOLLOW Performed at [facility]    Report Status PENDING  Incomplete  C difficile quick scan w PCR reflex     Status: None   Collection Time: 01/21/16  9:01 AM  Result Value Ref Range Status   C Diff antigen NEGATIVE NEGATIVE Final   C Diff toxin NEGATIVE NEGATIVE Final   C Diff interpretation Negative for toxigenic C. difficile  Final    Radiology Reports Ct Angio Chest Pe W/cm &/or Wo Cm  01/08/2016  CLINICAL DATA:  50 year old female with stroke. Concern for pulmonary embolism. EXAM: CT ANGIOGRAPHY CHEST WITH CONTRAST TECHNIQUE: Multidetector CT  imaging of the chest was performed using the standard protocol during bolus administration of intravenous contrast. Multiplanar CT image reconstructions and MIPs were obtained to evaluate the vascular anatomy. CONTRAST:  76m OMNIPAQUE IOHEXOL 350 MG/ML SOLN COMPARISON:  Chest radiograph dated 01/03/2016 FINDINGS: The lungs are clear. There is no pleural effusion or pneumothorax. The central airways are patent. The thoracic aorta appears unremarkable. Evaluation of the pulmonary arteries is limited due to suboptimal opacification of the distal branches as well as streak artifact caused by patient's arms. No definite central pulmonary artery embolus identified. Top-normal cardiac size. No pericardial effusion. There is no hilar or mediastinal adenopathy. The esophagus is grossly unremarkable. No thyroid nodules identified. There is no axillary adenopathy. The chest wall soft tissues appear unremarkable. There is mild degenerative changes of the spine. No acute fracture. The visualized upper abdomen appears unremarkable. Review of the MIP images confirms the above findings. IMPRESSION: No acute intrathoracic pathology. No CT evidence  of central pulmonary artery embolus. Electronically Signed   By: Anner Crete M.D.   On: 01/08/2016 00:55   Dg Chest Port 1 View  01/20/2016  CLINICAL DATA:  Fever for 1 day EXAM: PORTABLE CHEST 1 VIEW COMPARISON:  Chest radiograph January 03, 2016; chest CT January 08, 2016 FINDINGS: There is no edema or consolidation. Heart size and pulmonary vascularity are normal. No adenopathy. No bone lesions. IMPRESSION: No edema or consolidation. Electronically Signed   By: Lowella Grip III M.D.   On: 01/20/2016 12:11   Dg Chest Port 1 View  01/03/2016  CLINICAL DATA:  50 year old female with sepsis and tachypnea EXAM: PORTABLE CHEST 1 VIEW COMPARISON:  Radiograph dated 12/04/2015 FINDINGS: The right-sided PICC is retracted and the tip now lies in the proximal right upper extremity  likely in the axillary or brachial vein. Recommend evaluation and adjustment. Single-view of the chest does not demonstrate a focal consolidation. The lungs are hypovolemic. There is no pleural effusion or pneumothorax. Stable cardiac silhouette. No acute osseous pathology. IMPRESSION: No acute cardiopulmonary process. Retracted right-sided PICC with tip in the proximal right arm. Recommend re-evaluation and repositioning. Electronically Signed   By: Anner Crete M.D.   On: 01/03/2016 18:48     CBC  Recent Labs Lab 01/20/16 1147 01/20/16 1524 01/21/16 0518 01/22/16 0717 01/23/16 0517  WBC 15.4* 12.4* 11.3* 5.8 6.9  HGB 8.9* 7.7* 8.6* 8.1* 8.1*  HCT 30.8* 26.3* 29.1* 27.2* 26.4*  PLT 351 294 277 262 257  MCV 92.5 89.2 93.3 93.2 91.3  MCH 26.7 26.1 27.6 27.7 28.0  MCHC 28.9* 29.3* 29.6* 29.8* 30.7  RDW 17.7* 17.6* 17.6* 17.6* 17.2*  LYMPHSABS 3.7 2.7  --   --   --   MONOABS 1.1* 0.9  --   --   --   EOSABS 0.0 0.0  --   --   --   BASOSABS 0.0 0.0  --   --   --     Chemistries   Recent Labs Lab 01/20/16 1147 01/20/16 1524 01/21/16 0518 01/22/16 0717 01/22/16 1434 01/23/16 0517  NA 157* 158* 155* 153* 151* 147*  K 4.8 3.9 3.9 4.2 3.4* 3.4*  CL 120* 125* 127* 124* 123* 118*  CO2 24 23 21* 21* 20* 19*  GLUCOSE 125* 118* 137* 128* 133* 108*  BUN 62* 50* 43* 28* 24* 20  CREATININE 1.08* 0.93 0.61 0.63 0.51 0.50  CALCIUM 9.8 8.8* 8.8* 8.7* 8.6* 8.6*  MG  --  2.8*  --   --   --   --   AST 54* 44* 26  --   --   --   ALT 76* 63* 50  --   --   --   ALKPHOS 186* 151* 125  --   --   --   BILITOT 0.6 0.6 0.7  --   --   --    ------------------------------------------------------------------------------------------------------------------ estimated creatinine clearance is 92.6 mL/min (by C-G formula based on Cr of 0.5). ------------------------------------------------------------------------------------------------------------------ No results for input(s): HGBA1C in the last  72 hours. ------------------------------------------------------------------------------------------------------------------ No results for input(s): CHOL, HDL, LDLCALC, TRIG, CHOLHDL, LDLDIRECT in the last 72 hours. ------------------------------------------------------------------------------------------------------------------  Recent Labs  01/20/16 1524  TSH 1.194   ------------------------------------------------------------------------------------------------------------------ No results for input(s): VITAMINB12, FOLATE, FERRITIN, TIBC, IRON, RETICCTPCT in the last 72 hours.  Coagulation profile  Recent Labs Lab 01/20/16 1524  INR 1.26    No results for input(s): DDIMER in the last 72 hours.  Cardiac Enzymes No results  for input(s): CKMB, TROPONINI, MYOGLOBIN in the last 168 hours.  Invalid input(s): CK ------------------------------------------------------------------------------------------------------------------ Invalid input(s): POCBNP     Time Spent in minutes   30 minutes   Rei Contee M.D on 01/23/2016 at 2:03 PM  Between 7am to 7pm - Pager - [phone]  After 7pm go to [email or web address] - password [facility]  [facility]   Office  [phone]

## 2016-01-23 NOTE — [place] FL2 (Deleted)
[place] MEDICAID FL2 LEVEL OF CARE SCREENING TOOL     IDENTIFICATION  Patient Name: [name] Birthdate: Oct 29, 1966 Sex: female Admission Date (Current Location): 01/20/2016  [place] and [place] Number:  Haynes Bast [id number] n Facility and Address:  [facility],  [address], [place] [address]      Provider Number: [id number]  Attending Physician Name and Address:  Starleen Arms, MD  Relative Name and Phone Number:  Gaynelle Adu, [phone]    Current Level of Care: Hospital Recommended Level of Care: Skilled Nursing Facility Prior Approval Number:    Date Approved/Denied:   PASRR Number:    Discharge Plan: SNF    Current Diagnoses: Patient Active Problem List   Diagnosis Date Noted  . Bacteremia 01/22/2016  . Hypernatremia 01/20/2016  . Essential hypertension   . Seizure disorder (HCC)   . Sepsis secondary to UTI (HCC) 12/05/2015  . UTI (urinary tract infection) due to urinary indwelling Foley catheter (HCC) 12/05/2015  . Leukocytosis 12/05/2015  . FTT (failure to thrive) in adult 12/05/2015  . Normocytic anemia 12/04/2015  . S/P percutaneous endoscopic gastrostomy (PEG) tube placement (HCC) 12/04/2015  . Decubitus ulcer of sacral region, stage 4 (HCC) 08/28/2015  . Hx of ischemic right ACA stroke 05/06/2015  . History of ischemic left ACA stroke 05/06/2015    Orientation RESPIRATION BLADDER Height & Weight     Self (pt nonverbal-responds to pain)  Normal Incontinent, Indwelling catheter (chronic foley) Weight: 187 lb 6.3 oz (85 kg) Height:   (165.1 cm) (taken 3/8 per facility)  BEHAVIORAL SYMPTOMS/MOOD NEUROLOGICAL BOWEL NUTRITION STATUS  Other (Comment) (no behaviors) Convulsions/Seizures (hx of seizures) Incontinent Feeding tube (feeding supplement (JEVITY 1.5 CAL/FIBER) liquid 1,000 mL)  AMBULATORY STATUS COMMUNICATION OF NEEDS Skin   Total Care Non-Verbally PU Stage and Appropriate Care, Wound Vac (Pt has negative  pressure wound vac continuous 125 mm/Hg change M W F)       PU Stage 4 Dressing: Daily (Wound VAC changed M W F)               Personal Care Assistance Level of Assistance  Total care           Functional Limitations Info  Sight, Hearing, Speech Sight Info: Adequate Hearing Info: Adequate Speech Info: Impaired (nonverbal)    SPECIAL CARE FACTORS FREQUENCY                       Contractures Contractures Info: Not present    Additional Factors Info  Isolation Precautions, Code Status, Allergies, Psychotropic, Insulin Sliding Scale Code Status Info: FULL code status Allergies Info: Amlodipine, Latex, Other Psychotropic Info: None listed Insulin Sliding Scale Info: None Noted Isolation Precautions Info: On contact precautions MRSA, Extended spectrum beta lactamase     Current Medications (01/23/2016):  This is the current hospital active medication list Current Facility-Administered Medications  Medication Dose Route Frequency Provider Last Rate Last Dose  . 0.9 %  sodium chloride infusion   Intravenous Once Alison Murray, MD      . acetaminophen (TYLENOL) solution 650 mg  650 mg Per Tube Daily Alison Murray, MD   650 mg at 01/23/16 1051  . acetaminophen (TYLENOL) tablet 650 mg  650 mg Oral Q6H PRN Alison Murray, MD   650 mg at 01/22/16 0444   Or  . acetaminophen (TYLENOL) suppository 650 mg  650 mg Rectal Q6H PRN Alison Murray, MD      .  antiseptic oral rinse (CPC / CETYLPYRIDINIUM CHLORIDE 0.05%) solution 7 mL  7 mL Mouth Rinse q12n4p Starleen Armsawood S Elgergawy, MD   7 mL at 01/23/16 1203  . bisacodyl (DULCOLAX) suppository 10 mg  10 mg Rectal Daily PRN Alison MurrayAlma M Devine, MD      . chlorhexidine (PERIDEX) 0.12 % solution 15 mL  15 mL Mouth Rinse BID Starleen Armsawood S Elgergawy, MD   15 mL at 01/23/16 1050  . cholecalciferol (VITAMIN D) tablet 5,000 Units  5,000 Units Oral Q breakfast Alison MurrayAlma M Devine, MD   5,000 Units at 01/23/16 [phone]  . cholestyramine light (PREVALITE) packet 4 g  4 g  Oral TID Alison MurrayAlma M Devine, MD   4 g at 01/23/16 1059  . dextrose 5 %-0.45 % sodium chloride infusion   Intravenous Continuous Starleen Armsawood S Elgergawy, MD 125 mL/hr at 01/23/16 [phone]    . feeding supplement (JEVITY 1.5 CAL/FIBER) liquid 1,000 mL  1,000 mL Per Tube Continuous Starleen Armsawood S Elgergawy, MD 45 mL/hr at 01/23/16 0632 1,000 mL at 01/23/16 [phone]  . feeding supplement (PRO-STAT SUGAR FREE 64) liquid 60 mL  60 mL Per Tube BID Alison MurrayAlma M Devine, MD   60 mL at 01/23/16 1100  . ferrous sulfate 300 (60 Fe) MG/5ML syrup 300 mg  300 mg Per Tube BID WC Alison MurrayAlma M Devine, MD   300 mg at 01/23/16 [phone]  . free water 200 mL  200 mL Oral 3 times per day Alison MurrayAlma M Devine, MD   200 mL at 01/23/16 1400  . heparin injection 5,000 Units  5,000 Units Subcutaneous 3 times per day Starleen Armsawood S Elgergawy, MD   5,000 Units at 01/23/16 1401  . lacosamide (VIMPAT) tablet 100 mg  100 mg Per Tube BID Alison MurrayAlma M Devine, MD   100 mg at 01/23/16 1054  . levETIRAcetam (KEPPRA) 100 MG/ML solution 1,500 mg  1,500 mg Per Tube BID Alison MurrayAlma M Devine, MD   1,500 mg at 01/23/16 1046  . loperamide (IMODIUM) capsule 4 mg  4 mg Oral QID PRN Alison MurrayAlma M Devine, MD   4 mg at 01/23/16 1352  . meropenem (MERREM) 1 g in sodium chloride 0.9 % 100 mL IVPB  1 g Intravenous Q8H Anh P Pham, RPH      . multivitamin with minerals tablet 1 tablet  1 tablet Per Tube Q breakfast Alison MurrayAlma M Devine, MD   1 tablet at 01/23/16 [phone]  . ondansetron (ZOFRAN) tablet 4 mg  4 mg Per Tube Q6H PRN Alison MurrayAlma M Devine, MD      . polyethylene glycol (MIRALAX / GLYCOLAX) packet 17 g  17 g Per Tube Daily Alison MurrayAlma M Devine, MD   17 g at 01/20/16 1843  . polyvinyl alcohol (LIQUIFILM TEARS) 1.4 % ophthalmic solution 2 drop  2 drop Both Eyes TID Alison MurrayAlma M Devine, MD   2 drop at 01/23/16 1058  . potassium chloride 20 MEQ/15ML (10%) solution 20 mEq  20 mEq Oral Q4H Starleen Armsawood S Elgergawy, MD   20 mEq at 01/23/16 1350  . ranitidine (ZANTAC) 150 MG/10ML syrup 150 mg  150 mg Per Tube BID Alison MurrayAlma M Devine, MD   150 mg at 01/23/16 1046  .  sodium chloride flush (NS) 0.9 % injection 3 mL  3 mL Intravenous Q12H Alison MurrayAlma M Devine, MD   3 mL at 01/22/16 2253  . traZODone (DESYREL) tablet 25 mg  25 mg Per Tube QHS PRN Alison MurrayAlma M Devine, MD      . Melene Muller[START ON 01/24/2016] vancomycin [facility](VANCOCIN)  1,500 mg in sodium chloride 0.9 % 500 mL IVPB  1,500 mg Intravenous Q24H Anh P Pham, RPH      . vitamin C (ASCORBIC ACID) tablet 1,000 mg  1,000 mg Oral Daily Alison Murray, MD   1,000 mg at 01/23/16 1054     Discharge Medications: Please see discharge summary for a list of discharge medications.  Relevant Imaging Results:  Relevant Lab Results:   Additional Information SSN: [id number]  KIDD, SUZANNA A, LCSW

## 2016-01-23 NOTE — [place] FL2 (Signed)
[place] MEDICAID FL2 LEVEL OF CARE SCREENING TOOL     IDENTIFICATION  Patient Name: Meghan PoreLaura Marie Stines Birthdate: 03/18/1966 Sex: female Admission Date (Current Location): 01/20/2016  [place]  [place] [id number] Facility and Address:  [facility],  [address], [place] [id number]      Provider Number: [id number]  Attending Physician Name and Address:  Starleen Armsawood S Elgergawy, MD  Relative Name and Phone Number:  Gaynelle Aduorrie, Son, [phone]    Current Level of Care: Hospital Recommended Level of Care: Skilled Nursing Facility Prior Approval Number:    Date Approved/Denied:   PASRR Number: [id number]  Discharge Plan: SNF    Current Diagnoses: Patient Active Problem List   Diagnosis Date Noted  . Bacteremia 01/22/2016  . Hypernatremia 01/20/2016  . Essential hypertension   . Seizure disorder (HCC)   . Sepsis secondary to UTI (HCC) 12/05/2015  . UTI (urinary tract infection) due to urinary indwelling Foley catheter (HCC) 12/05/2015  . Leukocytosis 12/05/2015  . FTT (failure to thrive) in adult 12/05/2015  . Normocytic anemia 12/04/2015  . S/P percutaneous endoscopic gastrostomy (PEG) tube placement (HCC) 12/04/2015  . Decubitus ulcer of sacral region, stage 4 (HCC) 08/28/2015  . Hx of ischemic right ACA stroke 05/06/2015  . History of ischemic left ACA stroke 05/06/2015    Orientation RESPIRATION BLADDER Height & Weight     Self (pt nonverbal-responds to pain)  Normal Incontinent, Indwelling catheter (chronic foley) Weight: 187 lb 6.3 oz (85 kg) Height:  5\' 5"  (165.1 cm) (taken 3/8 per facility)  BEHAVIORAL SYMPTOMS/MOOD NEUROLOGICAL BOWEL NUTRITION STATUS  Other (Comment) (no behaviors) Convulsions/Seizures (hx of seizures) Incontinent Feeding tube (feeding supplement (JEVITY 1.5 CAL/FIBER) liquid 1,000 mL)  AMBULATORY STATUS COMMUNICATION OF NEEDS Skin   Total Care Non-Verbally PU Stage and Appropriate Care, Wound Vac (Pt has  negative pressure wound vac continuous 125 mm/Hg change M W F)       PU Stage 4 Dressing: Daily (Wound VAC changed M W F)               Personal Care Assistance Level of Assistance  Total care           Functional Limitations Info  Sight, Hearing, Speech Sight Info: Adequate Hearing Info: Adequate Speech Info: Impaired (nonverbal)    SPECIAL CARE FACTORS FREQUENCY                       Contractures Contractures Info: Not present    Additional Factors Info  Isolation Precautions, Code Status, Allergies, Psychotropic, Insulin Sliding Scale Code Status Info: FULL code status Allergies Info: Amlodipine, Latex, Other Psychotropic Info: None listed Insulin Sliding Scale Info: None Noted Isolation Precautions Info: On contact precautions MRSA, Extended spectrum beta lactamase     Current Medications (01/23/2016):  This is the current hospital active medication list Current Facility-Administered Medications  Medication Dose Route Frequency Provider Last Rate Last Dose  . 0.9 %  sodium chloride infusion   Intravenous Once Alison MurrayAlma M Devine, MD      . acetaminophen (TYLENOL) solution 650 mg  650 mg Per Tube Daily Alison MurrayAlma M Devine, MD   650 mg at 01/23/16 1051  . acetaminophen (TYLENOL) tablet 650 mg  650 mg Oral Q6H PRN Alison MurrayAlma M Devine, MD   650 mg at 01/22/16 0444   Or  . acetaminophen (TYLENOL) suppository 650 mg  650 mg Rectal Q6H PRN Alison MurrayAlma M Devine, MD      . antiseptic  oral rinse (CPC / CETYLPYRIDINIUM CHLORIDE 0.05%) solution 7 mL  7 mL Mouth Rinse q12n4p Starleen Arms, MD   7 mL at 01/23/16 1203  . bisacodyl (DULCOLAX) suppository 10 mg  10 mg Rectal Daily PRN Alison Murray, MD      . chlorhexidine (PERIDEX) 0.12 % solution 15 mL  15 mL Mouth Rinse BID Starleen Arms, MD   15 mL at 01/23/16 1050  . cholecalciferol (VITAMIN D) tablet 5,000 Units  5,000 Units Oral Q breakfast Alison Murray, MD   5,000 Units at 01/23/16 [phone]  . cholestyramine light (PREVALITE) packet 4  g  4 g Oral TID Alison Murray, MD   4 g at 01/23/16 1059  . dextrose 5 %-0.45 % sodium chloride infusion   Intravenous Continuous Starleen Arms, MD 125 mL/hr at 01/23/16 [phone]    . feeding supplement (JEVITY 1.5 CAL/FIBER) liquid 1,000 mL  1,000 mL Per Tube Continuous Starleen Arms, MD 45 mL/hr at 01/23/16 0632 1,000 mL at 01/23/16 5409  . feeding supplement (PRO-STAT SUGAR FREE 64) liquid 60 mL  60 mL Per Tube BID Alison Murray, MD   60 mL at 01/23/16 1100  . ferrous sulfate 300 (60 Fe) MG/5ML syrup 300 mg  300 mg Per Tube BID WC Alison Murray, MD   300 mg at 01/23/16 8119  . free water 200 mL  200 mL Oral 3 times per day Alison Murray, MD   200 mL at 01/23/16 1400  . heparin injection 5,000 Units  5,000 Units Subcutaneous 3 times per day Starleen Arms, MD   5,000 Units at 01/23/16 1401  . lacosamide (VIMPAT) tablet 100 mg  100 mg Per Tube BID Alison Murray, MD   100 mg at 01/23/16 1054  . levETIRAcetam (KEPPRA) 100 MG/ML solution 1,500 mg  1,500 mg Per Tube BID Alison Murray, MD   1,500 mg at 01/23/16 1046  . loperamide (IMODIUM) capsule 4 mg  4 mg Oral QID PRN Alison Murray, MD   4 mg at 01/23/16 1352  . meropenem (MERREM) 1 g in sodium chloride 0.9 % 100 mL IVPB  1 g Intravenous Q8H Anh P Pham, RPH      . multivitamin with minerals tablet 1 tablet  1 tablet Per Tube Q breakfast Alison Murray, MD   1 tablet at 01/23/16 [phone]  . ondansetron (ZOFRAN) tablet 4 mg  4 mg Per Tube Q6H PRN Alison Murray, MD      . polyethylene glycol (MIRALAX / GLYCOLAX) packet 17 g  17 g Per Tube Daily Alison Murray, MD   17 g at 01/20/16 1843  . polyvinyl alcohol (LIQUIFILM TEARS) 1.4 % ophthalmic solution 2 drop  2 drop Both Eyes TID Alison Murray, MD   2 drop at 01/23/16 1058  . potassium chloride 20 MEQ/15ML (10%) solution 20 mEq  20 mEq Oral Q4H Starleen Arms, MD   20 mEq at 01/23/16 1350  . ranitidine (ZANTAC) 150 MG/10ML syrup 150 mg  150 mg Per Tube BID Alison Murray, MD   150 mg at 01/23/16  1046  . sodium chloride flush (NS) 0.9 % injection 3 mL  3 mL Intravenous Q12H Alison Murray, MD   3 mL at 01/22/16 2253  . traZODone (DESYREL) tablet 25 mg  25 mg Per Tube QHS PRN Alison Murray, MD      . Melene Muller ON 01/24/2016] vancomycin (VANCOCIN) 1,500  mg in sodium chloride 0.9 % 500 mL IVPB  1,500 mg Intravenous Q24H Anh P Pham, RPH      . vitamin C (ASCORBIC ACID) tablet 1,000 mg  1,000 mg Oral Daily Alison Murray, MD   1,000 mg at 01/23/16 1054     Discharge Medications: Please see discharge summary for a list of discharge medications.  Relevant Imaging Results:  Relevant Lab Results:   Additional Information SSN: [id number]  Haruto Demaria A, LCSW

## 2016-01-23 NOTE — Progress Notes (Signed)
Pt admitted from [facility]. Documentation in chart that [facility] is incorrect.   CSW received consult that pt family wanting to explore other SNF options.   CSW spoke with pt son, [name] via telephone. CSW introduced self and explained role.   Pt son reports that pt family has not been satisfied with the care pt has been receiving at [facility] and pt son feels like pt is in and out of the hospital every few weeks. Pt son expressed concern that when pt in hospital that a "band aid" is placed on pt medical needs and then once pt is back to the facility it is not long before pt requires hospitalization again. Pt son states that they have not been able to find alternative placement during past hospitalizations due to the wound. Pt son wants search done for alternative placement, but understands that pt would have to [facility] if no other options available. Pt son request meeting with CSW and CSW plans to meet with pt family tomorrow at 1 pm. Pt family requesting to meet with MD when at hospital as well. CSW to notify MD.   CSW completed FL2 and initiated SNF search excluding [facility], [facility], and [facility] per pt son request.   CSW to follow up with pt family tomorrow to further discuss disposition and provide support.  [name], MSW, LCSW Clinical Social Work [phone]

## 2016-01-24 ENCOUNTER — Inpatient Hospital Stay (HOSPITAL_COMMUNITY): Payer: Medicaid Other

## 2016-01-24 DIAGNOSIS — Z960 Presence of urogenital implants: Secondary | ICD-10-CM

## 2016-01-24 DIAGNOSIS — R8271 Bacteriuria: Secondary | ICD-10-CM

## 2016-01-24 DIAGNOSIS — I69365 Other paralytic syndrome following cerebral infarction, bilateral: Secondary | ICD-10-CM

## 2016-01-24 LAB — BASIC METABOLIC PANEL
Anion gap: 9 (ref 5–15)
BUN: 19 mg/dL (ref 6–20)
CO2: 20 mmol/L — ABNORMAL LOW (ref 22–32)
Calcium: 8.5 mg/dL — ABNORMAL LOW (ref 8.9–10.3)
Chloride: 113 mmol/L — ABNORMAL HIGH (ref 101–111)
Creatinine, Ser: 0.44 mg/dL (ref 0.44–1.00)
GFR calc Af Amer: >60 mL/min (ref >60)
GLUCOSE: 119 mg/dL — AB (ref 65–99)
POTASSIUM: 3.9 mmol/L (ref 3.5–5.1)
Sodium: 142 mmol/L (ref 135–145)

## 2016-01-24 LAB — CULTURE, BLOOD (ROUTINE X 2)

## 2016-01-24 LAB — CBC
HCT: 26.8 % — ABNORMAL LOW (ref 36.0–46.0)
Hemoglobin: 8.2 g/dL — ABNORMAL LOW (ref 12.0–15.0)
MCH: 27.4 pg (ref 26.0–34.0)
MCHC: 30.6 g/dL (ref 30.0–36.0)
MCV: 89.6 fL (ref 78.0–100.0)
PLATELETS: 263 10*3/uL (ref 150–400)
RBC: 2.99 MIL/uL — AB (ref 3.87–5.11)
RDW: 16.7 % — ABNORMAL HIGH (ref 11.5–15.5)
WBC: 8.2 10*3/uL (ref 4.0–10.5)

## 2016-01-24 LAB — URINE CULTURE: Culture: >=100000

## 2016-01-24 LAB — GLUCOSE, CAPILLARY: Glucose-Capillary: 106 mg/dL — ABNORMAL HIGH (ref 65–99)

## 2016-01-24 MED ORDER — METOPROLOL TARTRATE 50 MG PO TABS
50.0000 mg | ORAL_TABLET | Freq: Two times a day (BID) | ORAL | Status: DC
  Administered 2016-01-24 – 2016-01-30 (×14): 50 mg via ORAL
  Filled 2016-01-24 (×14): qty 1

## 2016-01-24 MED ORDER — LIDOCAINE HCL 1 % IJ SOLN
INTRAMUSCULAR | Status: AC
  Filled 2016-01-24: qty 20

## 2016-01-24 MED ORDER — SODIUM CHLORIDE 0.9 % IV SOLN
1.0000 g | Freq: Three times a day (TID) | INTRAVENOUS | Status: DC
  Administered 2016-01-24 – 2016-01-27 (×8): 1 g via INTRAVENOUS
  Filled 2016-01-24 (×9): qty 1

## 2016-01-24 MED ORDER — VANCOMYCIN HCL 10 G IV SOLR
1500.0000 mg | INTRAVENOUS | Status: DC
  Administered 2016-01-24 – 2016-01-26 (×3): 1500 mg via INTRAVENOUS
  Filled 2016-01-24 (×4): qty 1500

## 2016-01-24 NOTE — Progress Notes (Signed)
Spoke with Amil AmenJulia, primary RN regarding no order has been entered for Interventional Radiology to insert line.  There is only a consult from the RN regarding a midline but no order for IV Team or Interventional Radiology.  IR has to insert her lines.

## 2016-01-24 NOTE — Consult Note (Signed)
[place] for Infectious Disease  Date of Admission:  01/20/2016  Date of Consult:  01/24/2016  Reason for Consult: bacteremia Referring Physician: Elgergawy  Impression/Recommendation MRSE bacteremia Would give her 2 weeks of vanco Repeat BCx done yesterday pending  UTI vs Colonization Would stop imipenem at day 5 Recheck Cx only if sx/fever, wbc. It will likely always be positive.   Decubitus Ulcer Control stool contamination Foley catheter Wound care Consider imaging to eval for osteo.   CVA with quadraplegia  Thank you so much for this interesting consult,   Bobby Rumpf (pager) [phone] [email or web address]  [name] is an 50 y.o. female.  HPI: 50 yo F with hx of ischemic ACA CVA/quadraplegia, with feeding tube, indwelling foley, decubitus ulcer (stage IV), recent ESBL UTI. Adm from SNF on 3-17 with fever. In ED she was found to be hypotensive, tachycardic and to have a WBC 12.4. She was started on vanco/merrem.  Her hospital course has been complicated by fever (147.8, and diarrhea, C diff -).  Her BCx have grown Coag Neg Staph/MRSE. Her UCx has grown Klebsiella,  Proteus and Providencia.  She has been afebrile since 3-19 Her WBC is normal.   Past Medical History  Diagnosis Date  . Hypertension   . Stroke ([place])   . Anemia     Past Surgical History  Procedure Laterality Date  . Abdominal hysterectomy    . Cholecystectomy    . Flexible sigmoidoscopy N/A 10/25/2015    Procedure: FLEXIBLE SIGMOIDOSCOPY;  Surgeon: Laurence Spates, MD;  Location: [place];  Service: Endoscopy;  Laterality: N/A;     Allergies  Allergen Reactions  . Amlodipine Swelling  . Latex Rash    Unknown   . Other Other (See Comments)    Natural Rubber- Unknown     Medications:  Scheduled: . sodium chloride   Intravenous Once  . acetaminophen  650 mg Per Tube Daily  . antiseptic oral rinse  7 mL Mouth Rinse q12n4p  . chlorhexidine  15 mL Mouth Rinse BID   . cholecalciferol  5,000 Units Oral Q breakfast  . cholestyramine light  4 g Oral TID  . feeding supplement (PRO-STAT SUGAR FREE 64)  60 mL Per Tube BID  . ferrous sulfate  300 mg Per Tube BID WC  . free water  200 mL Oral 3 times per day  . heparin subcutaneous  5,000 Units Subcutaneous 3 times per day  . lacosamide  100 mg Per Tube BID  . levETIRAcetam  1,500 mg Per Tube BID  . lidocaine      . meropenem (MERREM) IV  1 g Intravenous Q8H  . metoprolol  50 mg Oral BID  . multivitamin with minerals  1 tablet Per Tube Q breakfast  . polyvinyl alcohol  2 drop Both Eyes TID  . ranitidine  150 mg Per Tube BID  . sodium chloride flush  3 mL Intravenous Q12H  . vancomycin  1,500 mg Intravenous Q24H  . vitamin C  1,000 mg Oral Daily    Abtx:  Anti-infectives    Start     Dose/Rate Route Frequency Ordered Stop   01/24/16 0600  vancomycin (VANCOCIN) 1,500 mg in sodium chloride 0.9 % 500 mL IVPB     1,500 mg 250 mL/hr over 120 Minutes Intravenous Every 24 hours 01/23/16 1430     01/23/16 1300  meropenem (MERREM) 1 g in sodium chloride 0.9 % 100 mL IVPB     1 g 200 mL/hr over  30 Minutes Intravenous Every 8 hours 01/23/16 1254     01/22/16 1400  cefTRIAXone (ROCEPHIN) 1 g in dextrose 5 % 50 mL IVPB  Status:  Discontinued     1 g 100 mL/hr over 30 Minutes Intravenous Every 24 hours 01/22/16 1211 01/23/16 1252   01/21/16 0200  vancomycin (VANCOCIN) IVPB 1000 mg/200 mL premix  Status:  Discontinued     1,000 mg 200 mL/hr over 60 Minutes Intravenous Every 12 hours 01/20/16 1243 01/23/16 1430   01/20/16 1800  meropenem (MERREM) 1 g in sodium chloride 0.9 % 100 mL IVPB  Status:  Discontinued     1 g 200 mL/hr over 30 Minutes Intravenous Every 8 hours 01/20/16 1243 01/22/16 1211   01/20/16 1230  piperacillin-tazobactam (ZOSYN) IVPB 3.375 g     3.375 g 100 mL/hr over 30 Minutes Intravenous  Once 01/20/16 1217 01/20/16 1255   01/20/16 1230  vancomycin (VANCOCIN) IVPB 1000 mg/200 mL premix      1,000 mg 200 mL/hr over 60 Minutes Intravenous  Once 01/20/16 1217 01/20/16 1343      Total days of antibiotics: 5 vanco/merrem          Social History:  reports that she has quit smoking. Her smoking use included Cigarettes. She has never used smokeless tobacco. She reports that she does not drink alcohol or use illicit drugs.  Family History  Problem Relation Age of Onset  . Heart disease Mother 86    MI    General ROS: unobtainable, pt non-verbal. pt having smear stools, not loose.   Blood pressure 157/93, pulse 110, temperature 98.5 F (36.9 C), temperature source Oral, resp. rate 20, height 5' 5"  (1.651 m), weight 85.1 kg (187 lb 9.8 oz), SpO2 100 %. General appearance: alert, no distress and does not respond to voice Eyes: negative findings: conjunctivae and sclerae normal and pupils equal, round, reactive to light and accomodation Throat: lips nl. will not open mouth, resists.  Neck: no adenopathy and supple, symmetrical, trachea midline Lungs: clear to auscultation bilaterally Heart: regular rate and rhythm Abdomen: normal findings: bowel sounds normal, soft, non-tender and PEG site no gross d/c Extremities: no dcubiti on heels. swelling BLE, contracture RUE. no cordis. new LUE PIC.  decubitus has VAC in place, no surrounding erythema.    Results for orders placed or performed during the hospital encounter of 01/20/16 (from the past 48 hour(s))  CBC     Status: Abnormal   Collection Time: 01/23/16  5:17 AM  Result Value Ref Range   WBC 6.9 4.0 - 10.5 K/uL   RBC 2.89 (L) 3.87 - 5.11 MIL/uL   Hemoglobin 8.1 (L) 12.0 - 15.0 g/dL   HCT 26.4 (L) 36.0 - 46.0 %   MCV 91.3 78.0 - 100.0 fL   MCH 28.0 26.0 - 34.0 pg   MCHC 30.7 30.0 - 36.0 g/dL   RDW 17.2 (H) 11.5 - 15.5 %   Platelets 257 150 - 400 K/uL  Basic metabolic panel     Status: Abnormal   Collection Time: 01/23/16  5:17 AM  Result Value Ref Range   Sodium 147 (H) 135 - 145 mmol/L   Potassium 3.4 (L) 3.5 - 5.1  mmol/L   Chloride 118 (H) 101 - 111 mmol/L   CO2 19 (L) 22 - 32 mmol/L   Glucose, Bld 108 (H) 65 - 99 mg/dL   BUN 20 6 - 20 mg/dL   Creatinine, Ser 0.50 0.44 - 1.00 mg/dL   Calcium 8.6 (  L) 8.9 - 10.3 mg/dL   GFR calc non Af Amer >60 >60 mL/min   GFR calc Af Amer >60 >60 mL/min    Comment: (NOTE) The eGFR has been calculated using the CKD EPI equation. This calculation has not been validated in all clinical situations. eGFR's persistently <60 mL/min signify possible Chronic Kidney Disease.    Anion gap 10 5 - 15  Glucose, capillary     Status: None   Collection Time: 01/23/16  7:41 AM  Result Value Ref Range   Glucose-Capillary 90 65 - 99 mg/dL   Comment 1 Notify RN   Vancomycin, trough     Status: Abnormal   Collection Time: 01/23/16  1:08 PM  Result Value Ref Range   Vancomycin Tr 23 (H) 10.0 - 20.0 ug/mL  CBC     Status: Abnormal   Collection Time: 01/24/16  5:18 AM  Result Value Ref Range   WBC 8.2 4.0 - 10.5 K/uL   RBC 2.99 (L) 3.87 - 5.11 MIL/uL   Hemoglobin 8.2 (L) 12.0 - 15.0 g/dL   HCT 26.8 (L) 36.0 - 46.0 %   MCV 89.6 78.0 - 100.0 fL   MCH 27.4 26.0 - 34.0 pg   MCHC 30.6 30.0 - 36.0 g/dL   RDW 16.7 (H) 11.5 - 15.5 %   Platelets 263 150 - 400 K/uL  Basic metabolic panel     Status: Abnormal   Collection Time: 01/24/16  5:18 AM  Result Value Ref Range   Sodium 142 135 - 145 mmol/L   Potassium 3.9 3.5 - 5.1 mmol/L   Chloride 113 (H) 101 - 111 mmol/L   CO2 20 (L) 22 - 32 mmol/L   Glucose, Bld 119 (H) 65 - 99 mg/dL   BUN 19 6 - 20 mg/dL   Creatinine, Ser 0.44 0.44 - 1.00 mg/dL   Calcium 8.5 (L) 8.9 - 10.3 mg/dL   GFR calc non Af Amer >60 >60 mL/min   GFR calc Af Amer >60 >60 mL/min    Comment: (NOTE) The eGFR has been calculated using the CKD EPI equation. This calculation has not been validated in all clinical situations. eGFR's persistently <60 mL/min signify possible Chronic Kidney Disease.    Anion gap 9 5 - 15  Glucose, capillary     Status: Abnormal     Collection Time: 01/24/16  8:03 AM  Result Value Ref Range   Glucose-Capillary 106 (H) 65 - 99 mg/dL      Component Value Date/Time   SDES URINE, CLEAN CATCH 01/20/2016 1915   SPECREQUEST NONE 01/20/2016 1915   CULT  01/20/2016 1915    >=100,000 COLONIES/mL PROVIDENCIA STUARTII >=100,000 COLONIES/mL PROTEUS MIRABILIS Performed at [place] 01/24/2016 FINAL 01/20/2016 1915   No results found. Recent Results (from the past 240 hour(s))  Blood Culture (routine x 2)     Status: None   Collection Time: 01/20/16 11:47 AM  Result Value Ref Range Status   Specimen Description BLOOD RIGHT FOREARM  Final   Special Requests BOTTLES DRAWN AEROBIC AND ANAEROBIC 5ML  Final   Culture  Setup Time   Final    GRAM POSITIVE COCCI IN CLUSTERS CRITICAL RESULT CALLED TO, READ BACK BY AND VERIFIED WITH: S DILLAN,RNA AT 1156 01/21/16 BY L BENFIELD IN BOTH AEROBIC AND ANAEROBIC BOTTLES    Culture   Final    STAPHYLOCOCCUS SPECIES (COAGULASE NEGATIVE) Performed at [facility]    Report Status 01/24/2016 FINAL  Final  Organism ID, Bacteria STAPHYLOCOCCUS SPECIES (COAGULASE NEGATIVE)  Final      Susceptibility   Staphylococcus species (coagulase negative) - MIC*    CIPROFLOXACIN >=8 RESISTANT Resistant     ERYTHROMYCIN >=8 RESISTANT Resistant     GENTAMICIN 4 SENSITIVE Sensitive     OXACILLIN >=4 RESISTANT Resistant     TETRACYCLINE 2 SENSITIVE Sensitive     VANCOMYCIN 1 SENSITIVE Sensitive     TRIMETH/SULFA 80 RESISTANT Resistant     CLINDAMYCIN >=8 RESISTANT Resistant     RIFAMPIN >=32 RESISTANT Resistant     Inducible Clindamycin NEGATIVE Sensitive     * STAPHYLOCOCCUS SPECIES (COAGULASE NEGATIVE)  Urine culture     Status: None   Collection Time: 01/20/16 11:57 AM  Result Value Ref Range Status   Specimen Description URINE, CATHETERIZED  Final   Special Requests NONE  Final   Culture   Final    >=100,000 COLONIES/mL KLEBSIELLA PNEUMONIAE 50,000  COLONIES/mL PROTEUS MIRABILIS ORGANISM 1 Confirmed Extended Spectrum Beta-Lactamase Producer (ESBL) Performed at [facility]    Report Status 01/23/2016 FINAL  Final   Organism ID, Bacteria PROTEUS MIRABILIS  Final   Organism ID, Bacteria KLEBSIELLA PNEUMONIAE  Final      Susceptibility   Klebsiella pneumoniae - MIC*    AMPICILLIN >=32 RESISTANT Resistant     CEFAZOLIN >=64 RESISTANT Resistant     CEFTRIAXONE >=64 RESISTANT Resistant     CIPROFLOXACIN >=4 RESISTANT Resistant     GENTAMICIN <=1 SENSITIVE Sensitive     IMIPENEM <=0.25 SENSITIVE Sensitive     NITROFURANTOIN 64 INTERMEDIATE Intermediate     TRIMETH/SULFA >=320 RESISTANT Resistant     AMPICILLIN/SULBACTAM >=32 RESISTANT Resistant     PIP/TAZO 16 SENSITIVE Sensitive     * >=100,000 COLONIES/mL KLEBSIELLA PNEUMONIAE   Proteus mirabilis - MIC*    AMPICILLIN <=2 SENSITIVE Sensitive     CEFAZOLIN <=4 SENSITIVE Sensitive     CEFTRIAXONE <=1 SENSITIVE Sensitive     CIPROFLOXACIN >=4 RESISTANT Resistant     GENTAMICIN <=1 SENSITIVE Sensitive     IMIPENEM 4 SENSITIVE Sensitive     NITROFURANTOIN 64 RESISTANT Resistant     TRIMETH/SULFA <=20 SENSITIVE Sensitive     AMPICILLIN/SULBACTAM <=2 SENSITIVE Sensitive     PIP/TAZO <=4 SENSITIVE Sensitive     * 50,000 COLONIES/mL PROTEUS MIRABILIS  Blood Culture (routine x 2)     Status: None   Collection Time: 01/20/16 12:06 PM  Result Value Ref Range Status   Specimen Description BLOOD RIGHT ANTECUBITAL  Final   Special Requests BOTTLES DRAWN AEROBIC AND ANAEROBIC 5ML  Final   Culture  Setup Time   Final    GRAM POSITIVE COCCI IN CLUSTERS AEROBIC BOTTLE CALLED TO SHEEK,S RN 01/21/16 1439 WOOTEN,K CONFIRMED BY V WILKINS    Culture   Final    STAPHYLOCOCCUS SPECIES (COAGULASE NEGATIVE) SUSCEPTIBILITIES PERFORMED ON PREVIOUS CULTURE WITHIN THE LAST 5 DAYS. Performed at [facility]    Report Status 01/24/2016 FINAL  Final  Culture, blood (x 2)     Status: None  (Preliminary result)   Collection Time: 01/20/16  3:24 PM  Result Value Ref Range Status   Specimen Description BLOOD LEFT ARM  Final   Special Requests IN PEDIATRIC BOTTLE 4CC  Final   Culture   Final    NO GROWTH 3 DAYS Performed at [facility]    Report Status PENDING  Incomplete  Culture, blood (x 2)     Status: None (Preliminary result)  Collection Time: 01/20/16  3:24 PM  Result Value Ref Range Status   Specimen Description BLOOD LEFT ARM  Final   Special Requests IN PEDIATRIC BOTTLE 4CC  Final   Culture   Final    NO GROWTH 3 DAYS Performed at [facility]    Report Status PENDING  Incomplete  MRSA PCR Screening     Status: None   Collection Time: 01/20/16  6:56 PM  Result Value Ref Range Status   MRSA by PCR NEGATIVE NEGATIVE Final    Comment:        The GeneXpert MRSA Assay (FDA approved for NASAL specimens only), is one component of a comprehensive MRSA colonization surveillance program. It is not intended to diagnose MRSA infection nor to guide or monitor treatment for MRSA infections.   Urine culture     Status: None   Collection Time: 01/20/16  7:15 PM  Result Value Ref Range Status   Specimen Description URINE, CLEAN CATCH  Final   Special Requests NONE  Final   Culture   Final    >=100,000 COLONIES/mL PROVIDENCIA STUARTII >=100,000 COLONIES/mL PROTEUS MIRABILIS Performed at [facility]    Report Status 01/24/2016 FINAL  Final   Organism ID, Bacteria PROVIDENCIA STUARTII  Final   Organism ID, Bacteria PROTEUS MIRABILIS  Final      Susceptibility   Proteus mirabilis - MIC*    AMPICILLIN <=2 SENSITIVE Sensitive     CEFAZOLIN <=4 SENSITIVE Sensitive     CEFTRIAXONE <=1 SENSITIVE Sensitive     CIPROFLOXACIN >=4 RESISTANT Resistant     GENTAMICIN <=1 SENSITIVE Sensitive     IMIPENEM 4 SENSITIVE Sensitive     NITROFURANTOIN 128 RESISTANT Resistant     TRIMETH/SULFA <=20 SENSITIVE Sensitive     AMPICILLIN/SULBACTAM <=2 SENSITIVE  Sensitive     PIP/TAZO <=4 SENSITIVE Sensitive     * >=100,000 COLONIES/mL PROTEUS MIRABILIS   Providencia stuartii - MIC*    AMPICILLIN >=32 RESISTANT Resistant     CEFAZOLIN >=64 RESISTANT Resistant     CEFTRIAXONE <=1 SENSITIVE Sensitive     CIPROFLOXACIN >=4 RESISTANT Resistant     GENTAMICIN 4 RESISTANT Resistant     IMIPENEM 0.5 SENSITIVE Sensitive     NITROFURANTOIN 256 RESISTANT Resistant     TRIMETH/SULFA <=20 SENSITIVE Sensitive     AMPICILLIN/SULBACTAM 16 INTERMEDIATE Intermediate     PIP/TAZO <=4 SENSITIVE Sensitive     * >=100,000 COLONIES/mL PROVIDENCIA STUARTII  C difficile quick scan w PCR reflex     Status: None   Collection Time: 01/21/16  9:01 AM  Result Value Ref Range Status   C Diff antigen NEGATIVE NEGATIVE Final   C Diff toxin NEGATIVE NEGATIVE Final   C Diff interpretation Negative for toxigenic C. difficile  Final      01/24/2016, 4:31 PM     LOS: 4 days    Records and images were personally reviewed where available.

## 2016-01-24 NOTE — Progress Notes (Signed)
Patient Demographics  [name], is a 50 y.o. female, DOB - January 03, 1966, PVV:[id number]  Admit date - 01/20/2016   Admitting Physician Robbie Lis, MD  Outpatient Primary MD for the patient is Arnette Norris, MD  LOS - 4   Chief Complaint  Patient presents with  . Fever       Admission HPI/Brief narrative: 50 year old female with past medical history of CVA, non verbal, has feeding tube, seizure disorder, recent hospitalization (12/03/2015 - 12/08/2015) for sepsis, UTI due to ESBL, subsequent admission for sepsis and GI bleed, she presents to [facility] ED from SNF for sepsis, Has chronic indwelling Foley catheter, workup significant for ESBL UTI, and for Staphylococcus coagulase-negative bacteremia.   Subjective:   [name] is nonverbal, cannot give any complaints, Still with diarrhea, afebrile over last 24 Hours.  Assessment & Plan    Principal Problem:   Sepsis secondary to UTI [facility]) Active Problems:   Hx of ischemic right ACA stroke   History of ischemic left ACA stroke   Decubitus ulcer of sacral region, stage 4 (HCC)   Normocytic anemia   S/P percutaneous endoscopic gastrostomy (PEG) tube placement (HCC)   UTI (urinary tract infection) due to urinary indwelling Foley catheter (HCC)   Leukocytosis   FTT (failure to thrive) in adult   Essential hypertension   Seizure disorder (HCC)   Hypernatremia   Bacteremia  Sepsis secondary toESBL UTI /Staphylococcus coag negative bacteremia - Sepsis criteria met  on admission with fever, tachycardia, tachypnea, hypotension, leukocytosis nad evidence of infection based on UA - Lactic acid 2.5, procalcitonin level 0.15 on admission. - Started meropenem and vanco due to history of ESBL, initially transitioned to Rocephin as Proteus mirabilis sensitive to Rocephin, but then urine culture growing Klebsiella pneumonia ESBL, transitioned back to  meropenem on 01/23/2016 - Blood culture growing 2 out of 2 Staphylococcus coag negative, continue with vancomycin, repeated blood culture on 2/20. - Continue IV fluids . - Patient with sacral pressure ulcer, wound looks clean no evidence of infection. - Patient with diarrhea, C. difficile negative - Patient lost IV access yesterday, multiple attempts with no success, requested IR to place PICC line. - Requested ID consult regarding further recommendation for antibiotic  Hx of ischemic ACA stroke / S/P percutaneous endoscopic gastrostomy (PEG) tube placement (HCC) / FTT (failure to thrive) in adult - Continue peg tube feeds  - Patient is not on any antiplatelet therapy most likely due to history of GI bleed in the past, and  intracranial hemorrhage in the past.   Decubitus ulcer of sacral region, stage 4 ([place]) - WOC consulted  - Stage IV, managed with wound vac  - Presented with wound vac dressings in place, but no wound vac  - Continue wound VAC, continue Foley   Normocytic anemia - Baseline Hgb 8.4,  7.7 on admission, Transfused 1 U PRBC , hemoglobin stable since transfusion.  Essential hypertension - On medication initially on hold secondary to hypertension, currently blood pressure improved, we'll resume on metoprolol  Seizure disorder (HCC) - No reports of seizures so far - Continue Vimpat and Keppra  Hypernatremia - Likely dehydration, sepsis - Continue water flushes for peg tube feeds  - Improving and D5 half-normal saline,  will decrease rate.   Functional quadriplegia - In the setting of chronic illness - Will return to SNF once medically stable for discharge    Code Status: Full  Family Communication: None at bedside  Disposition Plan: Back to SNF once stable   Procedures  none   Consults   none   Medications  Scheduled Meds: . sodium chloride   Intravenous Once  . acetaminophen  650 mg Per Tube Daily  . antiseptic oral rinse  7 mL Mouth Rinse  [id number]  . chlorhexidine  15 mL Mouth Rinse BID  . cholecalciferol  5,000 Units Oral Q breakfast  . cholestyramine light  4 g Oral TID  . feeding supplement (PRO-STAT SUGAR FREE 64)  60 mL Per Tube BID  . ferrous sulfate  300 mg Per Tube BID WC  . free water  200 mL Oral 3 times per day  . heparin subcutaneous  5,000 Units Subcutaneous 3 times per day  . lacosamide  100 mg Per Tube BID  . levETIRAcetam  1,500 mg Per Tube BID  . meropenem (MERREM) IV  1 g Intravenous Q8H  . multivitamin with minerals  1 tablet Per Tube Q breakfast  . polyethylene glycol  17 g Per Tube Daily  . polyvinyl alcohol  2 drop Both Eyes TID  . ranitidine  150 mg Per Tube BID  . sodium chloride flush  3 mL Intravenous Q12H  . vancomycin  1,500 mg Intravenous Q24H  . vitamin C  1,000 mg Oral Daily   Continuous Infusions: . dextrose 5 % and 0.45% NaCl Stopped (01/23/16 1746)  . feeding supplement (JEVITY 1.5 CAL/FIBER) 1,000 mL (01/24/16 1201)   PRN Meds:.acetaminophen **OR** acetaminophen, bisacodyl, loperamide, ondansetron, traZODone  DVT Prophylaxis  Heparin   Lab Results  Component Value Date   PLT 263 01/24/2016    Antibiotics    Anti-infectives    Start     Dose/Rate Route Frequency Ordered Stop   01/24/16 0600  vancomycin (VANCOCIN) 1,500 mg in sodium chloride 0.9 % 500 mL IVPB     1,500 mg 250 mL/hr over 120 Minutes Intravenous Every 24 hours 01/23/16 1430     01/23/16 1300  meropenem (MERREM) 1 g in sodium chloride 0.9 % 100 mL IVPB     1 g 200 mL/hr over 30 Minutes Intravenous Every 8 hours 01/23/16 1254     01/22/16 1400  cefTRIAXone (ROCEPHIN) 1 g in dextrose 5 % 50 mL IVPB  Status:  Discontinued     1 g 100 mL/hr over 30 Minutes Intravenous Every 24 hours 01/22/16 1211 01/23/16 1252   01/21/16 0200  vancomycin (VANCOCIN) IVPB 1000 mg/200 mL premix  Status:  Discontinued     1,000 mg 200 mL/hr over 60 Minutes Intravenous Every 12 hours 01/20/16 1243 01/23/16 1430   01/20/16 1800   meropenem (MERREM) 1 g in sodium chloride 0.9 % 100 mL IVPB  Status:  Discontinued     1 g 200 mL/hr over 30 Minutes Intravenous Every 8 hours 01/20/16 1243 01/22/16 1211   01/20/16 1230  piperacillin-tazobactam (ZOSYN) IVPB 3.375 g     3.375 g 100 mL/hr over 30 Minutes Intravenous  Once 01/20/16 1217 01/20/16 1255   01/20/16 1230  vancomycin (VANCOCIN) IVPB 1000 mg/200 mL premix     1,000 mg 200 mL/hr over 60 Minutes Intravenous  Once 01/20/16 1217 01/20/16 1343          Objective:   Filed Vitals:   01/23/16 1356 01/23/16 2057  01/24/16 0533 01/24/16 1429  BP: 133/86 124/84 124/65 157/93  Pulse: 102 107 100 110  Temp: 98.2 F (36.8 C) 98.1 F (36.7 C) 98.2 F (36.8 C) 98.5 F (36.9 C)  TempSrc: Axillary Axillary Axillary Oral  Resp: 20 20 20 20   Height:      Weight:   85.1 kg (187 lb 9.8 oz)   SpO2: 100% 98% 98% 100%    Wt Readings from Last 3 Encounters:  01/24/16 85.1 kg (187 lb 9.8 oz)  01/11/16 80.1 kg (176 lb 9.4 oz)  12/09/15 85.8 kg (189 lb 2.5 oz)     Intake/Output Summary (Last 24 hours) at 01/24/16 1521 Last data filed at 01/24/16 1130  Gross per 24 hour  Intake 505.83 ml  Output   1625 ml  Net -1119.17 ml     Physical Exam  Awake, Noncommunicative, chronically ill-appearing Supple Neck,No JVD,  Symmetrical Chest wall movement, Good air movement bilaterally, CTAB RRR,No Gallops,Rubs or new Murmurs, No Parasternal Heave +ve B.Sounds, Abd Soft, No tenderness,PEG + No Cyanosis, Clubbing or edema, stage IV sacral pressure ulcer,   Data Review   Micro Results Recent Results (from the past 240 hour(s))  Blood Culture (routine x 2)     Status: None   Collection Time: 01/20/16 11:47 AM  Result Value Ref Range Status   Specimen Description BLOOD RIGHT FOREARM  Final   Special Requests BOTTLES DRAWN AEROBIC AND ANAEROBIC 5ML  Final   Culture  Setup Time   Final    GRAM POSITIVE COCCI IN CLUSTERS CRITICAL RESULT CALLED TO, READ BACK BY AND VERIFIED  WITH: Glade Lloyd AT 1156 01/21/16 BY L BENFIELD IN BOTH AEROBIC AND ANAEROBIC BOTTLES    Culture   Final    STAPHYLOCOCCUS SPECIES (COAGULASE NEGATIVE) Performed at [facility]    Report Status 01/24/2016 FINAL  Final   Organism ID, Bacteria STAPHYLOCOCCUS SPECIES (COAGULASE NEGATIVE)  Final      Susceptibility   Staphylococcus species (coagulase negative) - MIC*    CIPROFLOXACIN >=8 RESISTANT Resistant     ERYTHROMYCIN >=8 RESISTANT Resistant     GENTAMICIN 4 SENSITIVE Sensitive     OXACILLIN >=4 RESISTANT Resistant     TETRACYCLINE 2 SENSITIVE Sensitive     VANCOMYCIN 1 SENSITIVE Sensitive     TRIMETH/SULFA 80 RESISTANT Resistant     CLINDAMYCIN >=8 RESISTANT Resistant     RIFAMPIN >=32 RESISTANT Resistant     Inducible Clindamycin NEGATIVE Sensitive     * STAPHYLOCOCCUS SPECIES (COAGULASE NEGATIVE)  Urine culture     Status: None   Collection Time: 01/20/16 11:57 AM  Result Value Ref Range Status   Specimen Description URINE, CATHETERIZED  Final   Special Requests NONE  Final   Culture   Final    >=100,000 COLONIES/mL KLEBSIELLA PNEUMONIAE 50,000 COLONIES/mL PROTEUS MIRABILIS ORGANISM 1 Confirmed Extended Spectrum Beta-Lactamase Producer (ESBL) Performed at [facility]    Report Status 01/23/2016 FINAL  Final   Organism ID, Bacteria PROTEUS MIRABILIS  Final   Organism ID, Bacteria KLEBSIELLA PNEUMONIAE  Final      Susceptibility   Klebsiella pneumoniae - MIC*    AMPICILLIN >=32 RESISTANT Resistant     CEFAZOLIN >=64 RESISTANT Resistant     CEFTRIAXONE >=64 RESISTANT Resistant     CIPROFLOXACIN >=4 RESISTANT Resistant     GENTAMICIN <=1 SENSITIVE Sensitive     IMIPENEM <=0.25 SENSITIVE Sensitive     NITROFURANTOIN 64 INTERMEDIATE Intermediate     TRIMETH/SULFA >=320 RESISTANT  Resistant     AMPICILLIN/SULBACTAM >=32 RESISTANT Resistant     PIP/TAZO 16 SENSITIVE Sensitive     * >=100,000 COLONIES/mL KLEBSIELLA PNEUMONIAE   Proteus mirabilis - MIC*     AMPICILLIN <=2 SENSITIVE Sensitive     CEFAZOLIN <=4 SENSITIVE Sensitive     CEFTRIAXONE <=1 SENSITIVE Sensitive     CIPROFLOXACIN >=4 RESISTANT Resistant     GENTAMICIN <=1 SENSITIVE Sensitive     IMIPENEM 4 SENSITIVE Sensitive     NITROFURANTOIN 64 RESISTANT Resistant     TRIMETH/SULFA <=20 SENSITIVE Sensitive     AMPICILLIN/SULBACTAM <=2 SENSITIVE Sensitive     PIP/TAZO <=4 SENSITIVE Sensitive     * 50,000 COLONIES/mL PROTEUS MIRABILIS  Blood Culture (routine x 2)     Status: None   Collection Time: 01/20/16 12:06 PM  Result Value Ref Range Status   Specimen Description BLOOD RIGHT ANTECUBITAL  Final   Special Requests BOTTLES DRAWN AEROBIC AND ANAEROBIC 5ML  Final   Culture  Setup Time   Final    GRAM POSITIVE COCCI IN CLUSTERS AEROBIC BOTTLE CALLED TO SHEEK,S RN 01/21/16 1439 WOOTEN,K CONFIRMED BY V WILKINS    Culture   Final    STAPHYLOCOCCUS SPECIES (COAGULASE NEGATIVE) SUSCEPTIBILITIES PERFORMED ON PREVIOUS CULTURE WITHIN THE LAST 5 DAYS. Performed at [facility]    Report Status 01/24/2016 FINAL  Final  Culture, blood (x 2)     Status: None (Preliminary result)   Collection Time: 01/20/16  3:24 PM  Result Value Ref Range Status   Specimen Description BLOOD LEFT ARM  Final   Special Requests IN PEDIATRIC BOTTLE 4CC  Final   Culture   Final    NO GROWTH 3 DAYS Performed at [facility]    Report Status PENDING  Incomplete  Culture, blood (x 2)     Status: None (Preliminary result)   Collection Time: 01/20/16  3:24 PM  Result Value Ref Range Status   Specimen Description BLOOD LEFT ARM  Final   Special Requests IN PEDIATRIC BOTTLE 4CC  Final   Culture   Final    NO GROWTH 3 DAYS Performed at [facility]    Report Status PENDING  Incomplete  MRSA PCR Screening     Status: None   Collection Time: 01/20/16  6:56 PM  Result Value Ref Range Status   MRSA by PCR NEGATIVE NEGATIVE Final    Comment:        The GeneXpert MRSA Assay  (FDA approved for NASAL specimens only), is one component of a comprehensive MRSA colonization surveillance program. It is not intended to diagnose MRSA infection nor to guide or monitor treatment for MRSA infections.   Urine culture     Status: None   Collection Time: 01/20/16  7:15 PM  Result Value Ref Range Status   Specimen Description URINE, CLEAN CATCH  Final   Special Requests NONE  Final   Culture   Final    >=100,000 COLONIES/mL PROVIDENCIA STUARTII >=100,000 COLONIES/mL PROTEUS MIRABILIS Performed at [facility]    Report Status 01/24/2016 FINAL  Final   Organism ID, Bacteria PROVIDENCIA STUARTII  Final   Organism ID, Bacteria PROTEUS MIRABILIS  Final      Susceptibility   Proteus mirabilis - MIC*    AMPICILLIN <=2 SENSITIVE Sensitive     CEFAZOLIN <=4 SENSITIVE Sensitive     CEFTRIAXONE <=1 SENSITIVE Sensitive     CIPROFLOXACIN >=4 RESISTANT Resistant     GENTAMICIN <=1 SENSITIVE Sensitive  IMIPENEM 4 SENSITIVE Sensitive     NITROFURANTOIN 128 RESISTANT Resistant     TRIMETH/SULFA <=20 SENSITIVE Sensitive     AMPICILLIN/SULBACTAM <=2 SENSITIVE Sensitive     PIP/TAZO <=4 SENSITIVE Sensitive     * >=100,000 COLONIES/mL PROTEUS MIRABILIS   Providencia stuartii - MIC*    AMPICILLIN >=32 RESISTANT Resistant     CEFAZOLIN >=64 RESISTANT Resistant     CEFTRIAXONE <=1 SENSITIVE Sensitive     CIPROFLOXACIN >=4 RESISTANT Resistant     GENTAMICIN 4 RESISTANT Resistant     IMIPENEM 0.5 SENSITIVE Sensitive     NITROFURANTOIN 256 RESISTANT Resistant     TRIMETH/SULFA <=20 SENSITIVE Sensitive     AMPICILLIN/SULBACTAM 16 INTERMEDIATE Intermediate     PIP/TAZO <=4 SENSITIVE Sensitive     * >=100,000 COLONIES/mL PROVIDENCIA STUARTII  C difficile quick scan w PCR reflex     Status: None   Collection Time: 01/21/16  9:01 AM  Result Value Ref Range Status   C Diff antigen NEGATIVE NEGATIVE Final   C Diff toxin NEGATIVE NEGATIVE Final   C Diff interpretation  Negative for toxigenic C. difficile  Final    Radiology Reports Ct Angio Chest Pe W/cm &/or Wo Cm  01/08/2016  CLINICAL DATA:  50 year old female with stroke. Concern for pulmonary embolism. EXAM: CT ANGIOGRAPHY CHEST WITH CONTRAST TECHNIQUE: Multidetector CT imaging of the chest was performed using the standard protocol during bolus administration of intravenous contrast. Multiplanar CT image reconstructions and MIPs were obtained to evaluate the vascular anatomy. CONTRAST:  80m OMNIPAQUE IOHEXOL 350 MG/ML SOLN COMPARISON:  Chest radiograph dated 01/03/2016 FINDINGS: The lungs are clear. There is no pleural effusion or pneumothorax. The central airways are patent. The thoracic aorta appears unremarkable. Evaluation of the pulmonary arteries is limited due to suboptimal opacification of the distal branches as well as streak artifact caused by patient's arms. No definite central pulmonary artery embolus identified. Top-normal cardiac size. No pericardial effusion. There is no hilar or mediastinal adenopathy. The esophagus is grossly unremarkable. No thyroid nodules identified. There is no axillary adenopathy. The chest wall soft tissues appear unremarkable. There is mild degenerative changes of the spine. No acute fracture. The visualized upper abdomen appears unremarkable. Review of the MIP images confirms the above findings. IMPRESSION: No acute intrathoracic pathology. No CT evidence of central pulmonary artery embolus. Electronically Signed   By: AAnner CreteM.D.   On: 01/08/2016 00:55   Dg Chest Port 1 View  01/20/2016  CLINICAL DATA:  Fever for 1 day EXAM: PORTABLE CHEST 1 VIEW COMPARISON:  Chest radiograph January 03, 2016; chest CT January 08, 2016 FINDINGS: There is no edema or consolidation. Heart size and pulmonary vascularity are normal. No adenopathy. No bone lesions. IMPRESSION: No edema or consolidation. Electronically Signed   By: WLowella GripIII M.D.   On: 01/20/2016 12:11   Dg  Chest Port 1 View  01/03/2016  CLINICAL DATA:  50year old female with sepsis and tachypnea EXAM: PORTABLE CHEST 1 VIEW COMPARISON:  Radiograph dated 12/04/2015 FINDINGS: The right-sided PICC is retracted and the tip now lies in the proximal right upper extremity likely in the axillary or brachial vein. Recommend evaluation and adjustment. Single-view of the chest does not demonstrate a focal consolidation. The lungs are hypovolemic. There is no pleural effusion or pneumothorax. Stable cardiac silhouette. No acute osseous pathology. IMPRESSION: No acute cardiopulmonary process. Retracted right-sided PICC with tip in the proximal right arm. Recommend re-evaluation and repositioning. Electronically Signed   By: AMilas Hock  Radparvar M.D.   On: 01/03/2016 18:48     CBC  Recent Labs Lab 01/20/16 1147 01/20/16 1524 01/21/16 0518 01/22/16 0717 01/23/16 0517 01/24/16 0518  WBC 15.4* 12.4* 11.3* 5.8 6.9 8.2  HGB 8.9* 7.7* 8.6* 8.1* 8.1* 8.2*  HCT 30.8* 26.3* 29.1* 27.2* 26.4* 26.8*  PLT 351 294 277 262 257 263  MCV 92.5 89.2 93.3 93.2 91.3 89.6  MCH 26.7 26.1 27.6 27.7 28.0 27.4  MCHC 28.9* 29.3* 29.6* 29.8* 30.7 30.6  RDW 17.7* 17.6* 17.6* 17.6* 17.2* 16.7*  LYMPHSABS 3.7 2.7  --   --   --   --   MONOABS 1.1* 0.9  --   --   --   --   EOSABS 0.0 0.0  --   --   --   --   BASOSABS 0.0 0.0  --   --   --   --     Chemistries   Recent Labs Lab 01/20/16 1147 01/20/16 1524 01/21/16 0518 01/22/16 0717 01/22/16 1434 01/23/16 0517 01/24/16 0518  NA 157* 158* 155* 153* 151* 147* 142  K 4.8 3.9 3.9 4.2 3.4* 3.4* 3.9  CL 120* 125* 127* 124* 123* 118* 113*  CO2 24 23 21* 21* 20* 19* 20*  GLUCOSE 125* 118* 137* 128* 133* 108* 119*  BUN 62* 50* 43* 28* 24* 20 19  CREATININE 1.08* 0.93 0.61 0.63 0.51 0.50 0.44  CALCIUM 9.8 8.8* 8.8* 8.7* 8.6* 8.6* 8.5*  MG  --  2.8*  --   --   --   --   --   AST 54* 44* 26  --   --   --   --   ALT 76* 63* 50  --   --   --   --   ALKPHOS 186* 151* 125  --   --    --   --   BILITOT 0.6 0.6 0.7  --   --   --   --    ------------------------------------------------------------------------------------------------------------------ estimated creatinine clearance is 92.6 mL/min (by C-G formula based on Cr of 0.44). ------------------------------------------------------------------------------------------------------------------ No results for input(s): HGBA1C in the last 72 hours. ------------------------------------------------------------------------------------------------------------------ No results for input(s): CHOL, HDL, LDLCALC, TRIG, CHOLHDL, LDLDIRECT in the last 72 hours. ------------------------------------------------------------------------------------------------------------------ No results for input(s): TSH, T4TOTAL, T3FREE, THYROIDAB in the last 72 hours.  Invalid input(s): FREET3 ------------------------------------------------------------------------------------------------------------------ No results for input(s): VITAMINB12, FOLATE, FERRITIN, TIBC, IRON, RETICCTPCT in the last 72 hours.  Coagulation profile  Recent Labs Lab 01/20/16 1524  INR 1.26    No results for input(s): DDIMER in the last 72 hours.  Cardiac Enzymes No results for input(s): CKMB, TROPONINI, MYOGLOBIN in the last 168 hours.  Invalid input(s): CK ------------------------------------------------------------------------------------------------------------------ Invalid input(s): POCBNP     Time Spent in minutes   30 minutes   Kashia Brossard M.D on 01/24/2016 at 3:21 PM  Between 7am to 7pm - Pager - [phone]  After 7pm go to [email or web address] - password [email or web address]  [facility]   Office  [phone]

## 2016-01-24 NOTE — Progress Notes (Signed)
Spoke with primary RN regarding order for midline insertion.   I addressed this issue yesterday that this patient has her lines inserted by Interventional Radiology due to us not being able to do it due to difficulty.   Interventional Radiology inserts her central lines.  RN calling MD.

## 2016-01-24 NOTE — Procedures (Signed)
Left cephalic vein DL PICC placed without immediate complications. Length 52 cm. Tip SVC/RA junction. Ok to use.

## 2016-01-24 NOTE — Progress Notes (Signed)
CSW continuing to follow.   CSW received notification from pt son that pt son wished to rescheduled meeting time with this CSW.   CSW rescheduled time to meet with pt son tomorrow 3/22 at 2 pm.   CSW to continue to follow.  Loletta SpecterSuzanna Liyah Higham, MSW, LCSW Clinical Social Work [phone]

## 2016-01-25 DIAGNOSIS — N390 Urinary tract infection, site not specified: Secondary | ICD-10-CM

## 2016-01-25 DIAGNOSIS — R7881 Bacteremia: Secondary | ICD-10-CM

## 2016-01-25 DIAGNOSIS — G40909 Epilepsy, unspecified, not intractable, without status epilepticus: Secondary | ICD-10-CM

## 2016-01-25 DIAGNOSIS — Z931 Gastrostomy status: Secondary | ICD-10-CM

## 2016-01-25 DIAGNOSIS — I10 Essential (primary) hypertension: Secondary | ICD-10-CM

## 2016-01-25 DIAGNOSIS — L89154 Pressure ulcer of sacral region, stage 4: Secondary | ICD-10-CM

## 2016-01-25 DIAGNOSIS — T83511A Infection and inflammatory reaction due to indwelling urethral catheter, initial encounter: Secondary | ICD-10-CM

## 2016-01-25 DIAGNOSIS — E870 Hyperosmolality and hypernatremia: Secondary | ICD-10-CM

## 2016-01-25 LAB — CULTURE, BLOOD (ROUTINE X 2)
CULTURE: NO GROWTH
CULTURE: NO GROWTH

## 2016-01-25 LAB — CBC
HCT: 26 % — ABNORMAL LOW (ref 36.0–46.0)
Hemoglobin: 8.1 g/dL — ABNORMAL LOW (ref 12.0–15.0)
MCH: 27.9 pg (ref 26.0–34.0)
MCHC: 31.2 g/dL (ref 30.0–36.0)
MCV: 89.7 fL (ref 78.0–100.0)
PLATELETS: 260 10*3/uL (ref 150–400)
RBC: 2.9 MIL/uL — ABNORMAL LOW (ref 3.87–5.11)
RDW: 17 % — AB (ref 11.5–15.5)
WBC: 8.1 10*3/uL (ref 4.0–10.5)

## 2016-01-25 LAB — BASIC METABOLIC PANEL
Anion gap: 6 (ref 5–15)
BUN: 19 mg/dL (ref 6–20)
CALCIUM: 8.4 mg/dL — AB (ref 8.9–10.3)
CO2: 22 mmol/L (ref 22–32)
CREATININE: 0.42 mg/dL — AB (ref 0.44–1.00)
Chloride: 113 mmol/L — ABNORMAL HIGH (ref 101–111)
GFR calc non Af Amer: >60 mL/min (ref >60)
Glucose, Bld: 124 mg/dL — ABNORMAL HIGH (ref 65–99)
Potassium: 3.3 mmol/L — ABNORMAL LOW (ref 3.5–5.1)
SODIUM: 141 mmol/L (ref 135–145)

## 2016-01-25 MED ORDER — SODIUM CHLORIDE 0.9% FLUSH
10.0000 mL | INTRAVENOUS | Status: DC | PRN
  Administered 2016-01-25 – 2016-01-29 (×8): 10 mL
  Filled 2016-01-25 (×8): qty 40

## 2016-01-25 MED ORDER — POTASSIUM CHLORIDE 20 MEQ/15ML (10%) PO SOLN
40.0000 meq | ORAL | Status: AC
Start: 2016-01-25 — End: 2016-01-25
  Administered 2016-01-25 (×2): 40 meq via ORAL
  Filled 2016-01-25 (×2): qty 30

## 2016-01-25 MED ORDER — ENOXAPARIN SODIUM 40 MG/0.4ML ~~LOC~~ SOLN: 40.0000 mg | SUBCUTANEOUS | Status: DC

## 2016-01-25 NOTE — Progress Notes (Signed)
INFECTIOUS DISEASE PROGRESS NOTE  ID: [name] is a 50 y.o. female with  Principal Problem:   Sepsis secondary to UTI [facility]) Active Problems:   Hx of ischemic right ACA stroke   History of ischemic left ACA stroke   Decubitus ulcer of sacral region, stage 4 (HCC)   Normocytic anemia   S/P percutaneous endoscopic gastrostomy (PEG) tube placement (HCC)   UTI (urinary tract infection) due to urinary indwelling Foley catheter (HCC)   Leukocytosis   FTT (failure to thrive) in adult   Essential hypertension   Seizure disorder (HCC)   Hypernatremia   Bacteremia  Subjective: Non-verbal, awake  Abtx:  Anti-infectives    Start     Dose/Rate Route Frequency Ordered Stop   01/24/16 2000  meropenem (MERREM) 1 g in sodium chloride 0.9 % 100 mL IVPB     1 g 200 mL/hr over 30 Minutes Intravenous Every 8 hours 01/24/16 1945     01/24/16 2000  vancomycin (VANCOCIN) 1,500 mg in sodium chloride 0.9 % 500 mL IVPB     1,500 mg 250 mL/hr over 120 Minutes Intravenous Every 24 hours 01/24/16 1947     01/24/16 0600  vancomycin (VANCOCIN) 1,500 mg in sodium chloride 0.9 % 500 mL IVPB  Status:  Discontinued     1,500 mg 250 mL/hr over 120 Minutes Intravenous Every 24 hours 01/23/16 1430 01/24/16 1946   01/23/16 1300  meropenem (MERREM) 1 g in sodium chloride 0.9 % 100 mL IVPB  Status:  Discontinued     1 g 200 mL/hr over 30 Minutes Intravenous Every 8 hours 01/23/16 1254 01/24/16 1944   01/22/16 1400  cefTRIAXone (ROCEPHIN) 1 g in dextrose 5 % 50 mL IVPB  Status:  Discontinued     1 g 100 mL/hr over 30 Minutes Intravenous Every 24 hours 01/22/16 1211 01/23/16 1252   01/21/16 0200  vancomycin (VANCOCIN) IVPB 1000 mg/200 mL premix  Status:  Discontinued     1,000 mg 200 mL/hr over 60 Minutes Intravenous Every 12 hours 01/20/16 1243 01/23/16 1430   01/20/16 1800  meropenem (MERREM) 1 g in sodium chloride 0.9 % 100 mL IVPB  Status:  Discontinued     1 g 200 mL/hr over 30 Minutes Intravenous  Every 8 hours 01/20/16 1243 01/22/16 1211   01/20/16 1230  piperacillin-tazobactam (ZOSYN) IVPB 3.375 g     3.375 g 100 mL/hr over 30 Minutes Intravenous  Once 01/20/16 1217 01/20/16 1255   01/20/16 1230  vancomycin (VANCOCIN) IVPB 1000 mg/200 mL premix     1,000 mg 200 mL/hr over 60 Minutes Intravenous  Once 01/20/16 1217 01/20/16 1343      Medications:  Scheduled: . sodium chloride   Intravenous Once  . acetaminophen  650 mg Per Tube Daily  . antiseptic oral rinse  7 mL Mouth Rinse q12n4p  . chlorhexidine  15 mL Mouth Rinse BID  . cholecalciferol  5,000 Units Oral Q breakfast  . cholestyramine light  4 g Oral TID  . feeding supplement (PRO-STAT SUGAR FREE 64)  60 mL Per Tube BID  . ferrous sulfate  300 mg Per Tube BID WC  . free water  200 mL Oral 3 times per day  . heparin subcutaneous  5,000 Units Subcutaneous 3 times per day  . lacosamide  100 mg Per Tube BID  . levETIRAcetam  1,500 mg Per Tube BID  . meropenem (MERREM) IV  1 g Intravenous Q8H  . metoprolol  50 mg Oral BID  .  multivitamin with minerals  1 tablet Per Tube Q breakfast  . polyvinyl alcohol  2 drop Both Eyes TID  . potassium chloride  40 mEq Oral Q4H  . ranitidine  150 mg Per Tube BID  . sodium chloride flush  3 mL Intravenous Q12H  . vancomycin  1,500 mg Intravenous Q24H  . vitamin C  1,000 mg Oral Daily    Objective: Vital signs in last 24 hours: Temp:  [98 F (36.7 C)-100.1 F (37.8 C)] 99.3 F (37.4 C) (03/22 1447) Pulse Rate:  [95-102] 95 (03/22 1447) Resp:  [20] 20 (03/22 1447) BP: (129-154)/(83-89) 129/83 mmHg (03/22 1447) SpO2:  [100 %] 100 % (03/22 1447) Weight:  [78.8 kg (173 lb 11.6 oz)] 78.8 kg (173 lb 11.6 oz) (03/22 0509)   General appearance: alert and no distress Resp: clear to auscultation bilaterally Cardio: regular rate and rhythm GI: normal findings: bowel sounds normal and soft, non-tender  Lab Results  Recent Labs  01/24/16 0518 01/25/16 0506  WBC 8.2 8.1  HGB 8.2*  8.1*  HCT 26.8* 26.0*  NA 142 141  K 3.9 3.3*  CL 113* 113*  CO2 20* 22  BUN 19 19  CREATININE 0.44 0.42*   Liver Panel No results for input(s): PROT, ALBUMIN, AST, ALT, ALKPHOS, BILITOT, BILIDIR, IBILI in the last 72 hours. Sedimentation Rate No results for input(s): ESRSEDRATE in the last 72 hours. C-Reactive Protein No results for input(s): CRP in the last 72 hours.  Microbiology: Recent Results (from the past 240 hour(s))  Blood Culture (routine x 2)     Status: None   Collection Time: 01/20/16 11:47 AM  Result Value Ref Range Status   Specimen Description BLOOD RIGHT FOREARM  Final   Special Requests BOTTLES DRAWN AEROBIC AND ANAEROBIC  Final   Culture  Setup Time   Final    GRAM POSITIVE COCCI IN CLUSTERS CRITICAL RESULT CALLED TO, READ BACK BY AND VERIFIED WITH: Tora Kindred AT 1156 01/21/16 BY L BENFIELD IN BOTH AEROBIC AND ANAEROBIC BOTTLES    Culture   Final    STAPHYLOCOCCUS SPECIES (COAGULASE NEGATIVE) Performed at [facility]    Report Status 01/24/2016 FINAL  Final   Organism ID, Bacteria STAPHYLOCOCCUS SPECIES (COAGULASE NEGATIVE)  Final      Susceptibility   Staphylococcus species (coagulase negative) - MIC*    CIPROFLOXACIN >=8 RESISTANT Resistant     ERYTHROMYCIN >=8 RESISTANT Resistant     GENTAMICIN 4 SENSITIVE Sensitive     OXACILLIN >=4 RESISTANT Resistant     TETRACYCLINE 2 SENSITIVE Sensitive     VANCOMYCIN 1 SENSITIVE Sensitive     TRIMETH/SULFA 80 RESISTANT Resistant     CLINDAMYCIN >=8 RESISTANT Resistant     RIFAMPIN >=32 RESISTANT Resistant     Inducible Clindamycin NEGATIVE Sensitive     * STAPHYLOCOCCUS SPECIES (COAGULASE NEGATIVE)  Urine culture     Status: None   Collection Time: 01/20/16 11:57 AM  Result Value Ref Range Status   Specimen Description URINE, CATHETERIZED  Final   Special Requests NONE  Final   Culture   Final    >=100,000 COLONIES/mL KLEBSIELLA PNEUMONIAE 50,000 COLONIES/mL PROTEUS MIRABILIS ORGANISM 1  Confirmed Extended Spectrum Beta-Lactamase Producer (ESBL) Performed at [facility]    Report Status 01/23/2016 FINAL  Final   Organism ID, Bacteria PROTEUS MIRABILIS  Final   Organism ID, Bacteria KLEBSIELLA PNEUMONIAE  Final      Susceptibility   Klebsiella pneumoniae - MIC*  AMPICILLIN >=32 RESISTANT Resistant     CEFAZOLIN >=64 RESISTANT Resistant     CEFTRIAXONE >=64 RESISTANT Resistant     CIPROFLOXACIN >=4 RESISTANT Resistant     GENTAMICIN <=1 SENSITIVE Sensitive     IMIPENEM <=0.25 SENSITIVE Sensitive     NITROFURANTOIN 64 INTERMEDIATE Intermediate     TRIMETH/SULFA >=320 RESISTANT Resistant     AMPICILLIN/SULBACTAM >=32 RESISTANT Resistant     PIP/TAZO 16 SENSITIVE Sensitive     * >=100,000 COLONIES/mL KLEBSIELLA PNEUMONIAE   Proteus mirabilis - MIC*    AMPICILLIN <=2 SENSITIVE Sensitive     CEFAZOLIN <=4 SENSITIVE Sensitive     CEFTRIAXONE <=1 SENSITIVE Sensitive     CIPROFLOXACIN >=4 RESISTANT Resistant     GENTAMICIN <=1 SENSITIVE Sensitive     IMIPENEM 4 SENSITIVE Sensitive     NITROFURANTOIN 64 RESISTANT Resistant     TRIMETH/SULFA <=20 SENSITIVE Sensitive     AMPICILLIN/SULBACTAM <=2 SENSITIVE Sensitive     PIP/TAZO <=4 SENSITIVE Sensitive     * 50,000 COLONIES/mL PROTEUS MIRABILIS  Blood Culture (routine x 2)     Status: None   Collection Time: 01/20/16 12:06 PM  Result Value Ref Range Status   Specimen Description BLOOD RIGHT ANTECUBITAL  Final   Special Requests BOTTLES DRAWN AEROBIC AND ANAEROBIC  Final   Culture  Setup Time   Final    GRAM POSITIVE COCCI IN CLUSTERS AEROBIC BOTTLE CALLED TO SHEEK,S RN 01/21/16 1439 WOOTEN,K CONFIRMED BY V WILKINS    Culture   Final    STAPHYLOCOCCUS SPECIES (COAGULASE NEGATIVE) SUSCEPTIBILITIES PERFORMED ON PREVIOUS CULTURE WITHIN THE LAST 5 DAYS. Performed at [facility]    Report Status 01/24/2016 FINAL  Final  Culture, blood (x 2)     Status: None   Collection Time: 01/20/16  3:24 PM    Result Value Ref Range Status   Specimen Description BLOOD LEFT ARM  Final   Special Requests IN PEDIATRIC BOTTLE 4CC  Final   Culture   Final    NO GROWTH 5 DAYS Performed at [name]    Report Status 01/25/2016 FINAL  Final  Culture, blood (x 2)     Status: None   Collection Time: 01/20/16  3:24 PM  Result Value Ref Range Status   Specimen Description BLOOD LEFT ARM  Final   Special Requests IN PEDIATRIC BOTTLE 4CC  Final   Culture   Final    NO GROWTH 5 DAYS Performed at [facility]    Report Status 01/25/2016 FINAL  Final  MRSA PCR Screening     Status: None   Collection Time: 01/20/16  6:56 PM  Result Value Ref Range Status   MRSA by PCR NEGATIVE NEGATIVE Final    Comment:        The GeneXpert MRSA Assay (FDA approved for NASAL specimens only), is one component of a comprehensive MRSA colonization surveillance program. It is not intended to diagnose MRSA infection nor to guide or monitor treatment for MRSA infections.   Urine culture     Status: None   Collection Time: 01/20/16  7:15 PM  Result Value Ref Range Status   Specimen Description URINE, CLEAN CATCH  Final   Special Requests NONE  Final   Culture   Final    >=100,000 COLONIES/mL PROVIDENCIA STUARTII >=100,000 COLONIES/mL PROTEUS MIRABILIS Performed at [facility]    Report Status 01/24/2016 FINAL  Final   Organism ID, Bacteria PROVIDENCIA STUARTII  Final   Organism  ID, Bacteria PROTEUS MIRABILIS  Final      Susceptibility   Proteus mirabilis - MIC*    AMPICILLIN <=2 SENSITIVE Sensitive     CEFAZOLIN <=4 SENSITIVE Sensitive     CEFTRIAXONE <=1 SENSITIVE Sensitive     CIPROFLOXACIN >=4 RESISTANT Resistant     GENTAMICIN <=1 SENSITIVE Sensitive     IMIPENEM 4 SENSITIVE Sensitive     NITROFURANTOIN 128 RESISTANT Resistant     TRIMETH/SULFA <=20 SENSITIVE Sensitive     AMPICILLIN/SULBACTAM <=2 SENSITIVE Sensitive     PIP/TAZO <=4 SENSITIVE Sensitive     * >=100,000  COLONIES/mL PROTEUS MIRABILIS   Providencia stuartii - MIC*    AMPICILLIN >=32 RESISTANT Resistant     CEFAZOLIN >=64 RESISTANT Resistant     CEFTRIAXONE <=1 SENSITIVE Sensitive     CIPROFLOXACIN >=4 RESISTANT Resistant     GENTAMICIN 4 RESISTANT Resistant     IMIPENEM 0.5 SENSITIVE Sensitive     NITROFURANTOIN 256 RESISTANT Resistant     TRIMETH/SULFA <=20 SENSITIVE Sensitive     AMPICILLIN/SULBACTAM 16 INTERMEDIATE Intermediate     PIP/TAZO <=4 SENSITIVE Sensitive     * >=100,000 COLONIES/mL PROVIDENCIA STUARTII  C difficile quick scan w PCR reflex     Status: None   Collection Time: 01/21/16  9:01 AM  Result Value Ref Range Status   C Diff antigen NEGATIVE NEGATIVE Final   C Diff toxin NEGATIVE NEGATIVE Final   C Diff interpretation Negative for toxigenic C. difficile  Final  Culture, blood (routine x 2)     Status: None (Preliminary result)   Collection Time: 01/23/16  8:20 PM  Result Value Ref Range Status   Specimen Description BLOOD LEFT ARM  Final   Special Requests BOTTLES DRAWN AEROBIC AND ANAEROBIC 5 CC  Final   Culture   Final    NO GROWTH 1 DAY Performed at [facility]    Report Status PENDING  Incomplete  Culture, blood (routine x 2)     Status: None (Preliminary result)   Collection Time: 01/23/16  8:23 PM  Result Value Ref Range Status   Specimen Description BLOOD LEFT HAND  Final   Special Requests BOTTLES DRAWN AEROBIC AND ANAEROBIC 5 CC  Final   Culture   Final    NO GROWTH 1 DAY Performed at [facility]    Report Status PENDING  Incomplete    Studies/Results: Ir Fluoro Guide Cv Line Left  01/25/2016  INDICATION: Bacteremia; request made for central venous access for antibiotic therapy. EXAM: LEFT UPPER EXTREMITY PICC LINE PLACEMENT WITH ULTRASOUND AND FLUOROSCOPIC GUIDANCE MEDICATIONS: NONE ANESTHESIA/SEDATION: NONE FLUOROSCOPY TIME:  Fluoroscopy Time:  42 SECONDS COMPLICATIONS: None immediate. PROCEDURE: The patient's son was advised of  the possible risks and complications and agreed to undergo the procedure. The patient was then brought to the angiographic suite for the procedure. The left arm was prepped with chlorhexidine, draped in the usual sterile fashion using maximum barrier technique (cap and mask, sterile gown, sterile gloves, large sterile sheet, hand hygiene and cutaneous antisepsis) and infiltrated locally with 1% Lidocaine. Ultrasound demonstrated patency of the left cephalic vein, and this was documented with an image. Under real-time ultrasound guidance, this vein was accessed with a 21 gauge micropuncture needle and image documentation was performed. A 0.018 wire was introduced in to the vein. Over this, a 5 [place] double lumen power-injectable PICC was advanced to the lower SVC/right atrial junction. Fluoroscopy during the procedure and fluoro spot radiograph confirms appropriate catheter  position. The catheter was flushed and covered with a sterile dressing. Catheter length:  52 cm IMPRESSION: Successful left arm Power PICC line placement with ultrasound and fluoroscopic guidance. The catheter is ready for use. Read by: Jeananne RamaKevin Allred, PA-C Electronically Signed   By: Malachy MoanHeath  McCullough M.D.   On: 01/24/2016 19:21   Ir Koreas Guide Vasc Access Left  01/25/2016  INDICATION: Bacteremia; request made for central venous access for antibiotic therapy. EXAM: LEFT UPPER EXTREMITY PICC LINE PLACEMENT WITH ULTRASOUND AND FLUOROSCOPIC GUIDANCE MEDICATIONS: NONE ANESTHESIA/SEDATION: NONE FLUOROSCOPY TIME:  Fluoroscopy Time:  42 SECONDS COMPLICATIONS: None immediate. PROCEDURE: The patient's son was advised of the possible risks and complications and agreed to undergo the procedure. The patient was then brought to the angiographic suite for the procedure. The left arm was prepped with chlorhexidine, draped in the usual sterile fashion using maximum barrier technique (cap and mask, sterile gown, sterile gloves, large sterile sheet, hand hygiene  and cutaneous antisepsis) and infiltrated locally with 1% Lidocaine. Ultrasound demonstrated patency of the left cephalic vein, and this was documented with an image. Under real-time ultrasound guidance, this vein was accessed with a 21 gauge micropuncture needle and image documentation was performed. A 0.018 wire was introduced in to the vein. Over this, a 5 [place] double lumen power-injectable PICC was advanced to the lower SVC/right atrial junction. Fluoroscopy during the procedure and fluoro spot radiograph confirms appropriate catheter position. The catheter was flushed and covered with a sterile dressing. Catheter length:  52 cm IMPRESSION: Successful left arm Power PICC line placement with ultrasound and fluoroscopic guidance. The catheter is ready for use. Read by: Jeananne RamaKevin Allred, PA-C Electronically Signed   By: Malachy MoanHeath  McCullough M.D.   On: 01/24/2016 19:21     Assessment/Plan: MRSE bacteremia Would give her 2 weeks of vanco (today is day 6) Repeat BCx 3-20 ngtd  UTI vs Colonization Stop imipenem in 2 more days Recheck UCx only if sx/fever, increased wbc. It will likely always be positive.   Decubitus Ulcer Control stool contamination Foley catheter Wound care D/I Dr Butler Denmarkizwan- her wound appears clean, defer imaging at this time.   CVA with quadraplegia  Palliative Care note seen, appreciated.   Available as needed.         Johny SaxJeffrey Hatcher Infectious Diseases (pager) [phone] [email or web address] 01/25/2016, 5:26 PM  LOS: 5 days

## 2016-01-25 NOTE — Progress Notes (Addendum)
Nutrition Follow-up  DOCUMENTATION CODES:   Obesity unspecified  INTERVENTION:  - Continue Jevity 1.5 @ 45 mL/hr with 60 mL Prostat BID and 200 mL free water TID. This regimen provides 2020 kcal, 129 grams of protein, and 1421 mL free water. - Will monitor for changes to IVF regimen. - RD will continue to monitor for needs concerning TF regimen.  NUTRITION DIAGNOSIS:   Increased nutrient needs related to wound healing as evidenced by estimated needs. -ongoing  GOAL:   Patient will meet greater than or equal to 90% of their needs -met  MONITOR:   Labs, I & O's, TF tolerance, Skin, Weight trends  ASSESSMENT:   50 year old female with past medical history of CVA, non verbal, has feeding tube, seizure disorder, recent hospitalization (12/03/2015 - 12/08/2015) for sepsis, UTI due to ESBL, subsequent admission for sepsis and GI bleed. She presented to [facility] ED from SNF for fevers. Pt is not able to provide any history. No respiratory distress. No falls. No loss of consciousness.  3/22 Pt with PEG and is nonverbal at baseline with no family/visitors present at this time. Pt currently receiving Jevity 1.5 @ 45 mL/hr with 60 mL Prostat BID and 200 mL free TID.  Protein needs adjusted since previous RD assessment to align with current medical status, non-ambulatory. Current TF regimen meets adjusted protein needs.  IVF: D5-1/2 NS @ 75 mL/hr (306 kcal). Medications reviewed. Labs reviewed; K: 3.3 mmol/L, Cl: 113 mmol/L, creatinine low, Ca: 8.4 mg/dL.    3/19 - RD consulted for TF management. - Visited pt at bedside.  - She is non-verbal. No family available. Unable to retrieve any history. - Patient has PEG. - She also has a Stg IV decubitus ulcer.  - Nutrition-Focused physical exam completed. Findings are no fat depletion, no muscle depletion, and no edema   Diet Order:  Diet regular Room service appropriate?: Yes; Fluid consistency:: Thin  Skin:   Stage 4 sacral pressure  ulcer  Last BM:  3/21  Height:   Ht Readings from Last 1 Encounters:  01/20/16 _0  (1.651 m)    Weight:   Wt Readings from Last 1 Encounters:  01/25/16 173 lb 11.6 oz (78.8 kg)    Ideal Body Weight:  54.5 kg  BMI:  Body mass index is 28.91 kg/(m^2).  Estimated Nutritional Needs:   Kcal:  2000-2150 (33-35 kcal/kg actual body weight)  Protein:  115-130 grams (2-2.3 grams/kg IBW)  Fluid:  >/= 2L  EDUCATION NEEDS:   No education needs identified at this time     Jarome Matin, RD, LDN Inpatient Clinical Dietitian Pager # [phone] After hours/weekend pager # [phone]

## 2016-01-25 NOTE — Progress Notes (Addendum)
[facility] Progress Note   [name]  WJX:[id number]  DOB: Jun 27, 1966  DOA: 01/20/2016 PCP: Ruthe Mannan, MD  Brief narrative: [name] is a 50 y.o. 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 Hospitalized on 1/28 through 2/2 for sepsis secondary to ESBL UTI Hospitalized/28 through 3/8 with Proteus mirabilis UTI, bright red blood per rectum Sent from skilled nursing facility with fever and hypotension tachycardia and a WBC count of 12.4. Subsequently developed diarrhea which is negative for C. difficile colitis. Blood cultures grew out coag-negative staph/MRSE and urine culture has grown Klebsiella, Proteus and [name] here.     Subjective: Noncommunicative  Assessment/Plan: Principal Problem:   Sepsis  (A) MRSE bacteremia- from sacral wound?- ID recommends 2 weeks of vancomycin-repeat blood culture negative (B) ESBL UTI secondary to chronic Foley versus colonization-ID recommends 5 days of meropenem-meropenem was started on 3/17 but missed 3 days 1819 and 20 due to lack of IV-will receive 2 more days-last dose tomorrow evening    Note patient had fever this morning of 100.1 which is likely due to missing antibiotic doses because she did not have an IV-now has PICC line-continue to follow temps  Active Problems:    Decubitus ulcer of sacral region, stage 4  with soiling secondary to stool - Wound care injections: Cleanse wound with NS and pat gently dry. Apply black granufoam to wound bed. Bridge dressing to hip to offload pressure. Change Mon/Wed/Fri -Having loose stools-will ask nutrition if tube feeds can be changed to something with high fiber-Imodium when necessary-Place rectal tube if needed -Wound does not appear  to be infected   Hypokalemia -Replace with 2 doses of KCl 40 mEq each  History of intercerebral hemorrhage during clipping of aneurysm- status post PEG -Nonverbal, nonmobile, does not follow commands -Continue PEG tube feeds  Normocytic anemia - Baseline Hgb 8.4, 7.7 on admission, Transfused 1 U PRBC , hemoglobin stable since transfusion.  Essential hypertension -  medication initially on hold secondary to hypertension, currently blood pressure improved, we'll resume on metoprolol  Seizure disorder (HCC) - Continue Vimpat and Keppra   Antibiotics: Anti-infectives    Start     Dose/Rate Route Frequency Ordered Stop   01/24/16 2000  meropenem (MERREM) 1 g in sodium chloride 0.9 % 100 mL IVPB     1 g 200 mL/hr over 30 Minutes Intravenous Every 8 hours 01/24/16 1945     01/24/16 2000  vancomycin (VANCOCIN) 1,500 mg in sodium chloride 0.9 % 500 mL IVPB     1,500 mg 250 mL/hr over 120 Minutes Intravenous Every 24 hours 01/24/16 1947     01/24/16 0600  vancomycin (VANCOCIN) 1,500 mg in sodium chloride 0.9 % 500 mL IVPB  Status:  Discontinued     1,500 mg 250 mL/hr over 120 Minutes Intravenous Every 24 hours 01/23/16 1430 01/24/16 1946   01/23/16 1300  meropenem (MERREM) 1 g in sodium chloride 0.9 % 100 mL IVPB  Status:  Discontinued     1 g 200 mL/hr over 30 Minutes Intravenous Every 8 hours 01/23/16 1254 01/24/16 1944   01/22/16 1400  cefTRIAXone (ROCEPHIN) 1 g in dextrose 5 % 50 mL IVPB  Status:  Discontinued     1 g 100 mL/hr over 30 Minutes Intravenous Every 24 hours 01/22/16 1211  01/23/16 1252   01/21/16 0200  vancomycin (VANCOCIN) IVPB 1000 mg/200 mL premix  Status:  Discontinued     1,000 mg 200 mL/hr over 60 Minutes Intravenous Every 12 hours 01/20/16 1243 01/23/16 1430   01/20/16 1800  meropenem (MERREM) 1 g in sodium chloride 0.9 % 100 mL IVPB  Status:  Discontinued     1 g 200 mL/hr over 30 Minutes Intravenous Every 8 hours 01/20/16 1243 01/22/16 1211   01/20/16 1230   piperacillin-tazobactam (ZOSYN) IVPB 3.375 g     3.375 g 100 mL/hr over 30 Minutes Intravenous  Once 01/20/16 1217 01/20/16 1255   01/20/16 1230  vancomycin (VANCOCIN) IVPB 1000 mg/200 mL premix     1,000 mg 200 mL/hr over 60 Minutes Intravenous  Once 01/20/16 1217 01/20/16 1343     Code Status:     Code Status Orders        Start     Ordered   01/20/16 1515  Full code   Continuous     01/20/16 1514    Code Status History    Date Active Date Inactive Code Status Order ID Comments User Context   01/03/2016  9:48 PM 01/11/2016  6:38 PM Full Code [id number]  Briscoe Deutscher, MD ED   12/04/2015  8:31 AM 12/09/2015  4:07 PM Full Code [id number]  Maryruth Bun Rama, MD Inpatient   10/12/2015  7:29 PM 10/26/2015  5:28 PM Full Code [id number]  Rahul Astrid Drafts, PA-C ED   08/28/2015  6:20 PM 09/06/2015  6:49 PM Full Code [id number]  Hollice Espy, MD Inpatient   06/01/2015  8:34 PM 06/06/2015  5:56 PM Full Code [id number]  Ron Parker, MD Inpatient   03/16/2015  5:22 PM 03/22/2015  7:54 PM Full Code [id number]  Haydee Monica, MD Inpatient   03/31/2014 10:31 AM 04/07/2014  7:32 PM Full Code [id number]  Coralyn Helling, MD ED     Family Communication: Disposition Plan: Return to SNF- palliative care has been discussed family on multiple occasions they have declined DVT prophylaxis: Heparin Consultants: ID Procedures:     Objective: Filed Weights   01/23/16 0447 01/24/16 0533 01/25/16 0509  Weight: 85 kg (187 lb 6.3 oz) 85.1 kg (187 lb 9.8 oz) 78.8 kg (173 lb 11.6 oz)    Intake/Output Summary (Last 24 hours) at 01/25/16 1519 Last data filed at 01/25/16 1448  Gross per 24 hour  Intake 1861.25 ml  Output   2050 ml  Net -188.75 ml     Vitals Filed Vitals:   01/24/16 1429 01/24/16 2224 01/25/16 0509 01/25/16 1447  BP: 157/93 154/89 129/89 129/83  Pulse: 110 100 102 95  Temp: 98.5 F (36.9 C) 98 F (36.7 C) 100.1 F (37.8 C) 99.3 F (37.4 C)  TempSrc: Oral Oral Oral Axillary  Resp: 20 20 20  20   Height:      Weight:   78.8 kg (173 lb 11.6 oz)   SpO2: 100% 100% 100% 100%    Exam:  General:  Pt is alert,Does not communicate, not in acute distress  HEENT: No icterus, No thrush, oral mucosa moist  Cardiovascular: regular rate and rhythm, S1/S2 No murmur  Respiratory: clear to auscultation bilaterally   Abdomen: Soft, +Bowel sounds, non tender, non distended, no guarding-Peg tube intact  MSK: No cyanosis or clubbing- no pedal edema-contracted extremities  Skin: Wound VAC on sacral decubitus ulcer   Data Reviewed: Basic Metabolic Panel:  Recent Labs Lab 01/20/16 1524  01/22/16 0717  01/22/16 1434 01/23/16 0517 01/24/16 0518 01/25/16 0506  NA 158*  < > 153* 151* 147* 142 141  K 3.9  < > 4.2 3.4* 3.4* 3.9 3.3*  CL 125*  < > 124* 123* 118* 113* 113*  CO2 23  < > 21* 20* 19* 20* 22  GLUCOSE 118*  < > 128* 133* 108* 119* 124*  BUN 50*  < > 28* 24* 20 19 19   CREATININE 0.93  < > 0.63 0.51 0.50 0.44 0.42*  CALCIUM 8.8*  < > 8.7* 8.6* 8.6* 8.5* 8.4*  MG 2.8*  --   --   --   --   --   --   PHOS 4.1  --   --   --   --   --   --   < > = values in this interval not displayed. Liver Function Tests:  Recent Labs Lab 01/20/16 1147 01/20/16 1524 01/21/16 0518  AST 54* 44* 26  ALT 76* 63* 50  ALKPHOS 186* 151* 125  BILITOT 0.6 0.6 0.7  PROT 8.4* 7.1 6.7  ALBUMIN 2.8* 2.3* 2.2*   No results for input(s): LIPASE, AMYLASE in the last 168 hours. No results for input(s): AMMONIA in the last 168 hours. CBC:  Recent Labs Lab 01/20/16 1147 01/20/16 1524 01/21/16 0518 01/22/16 0717 01/23/16 0517 01/24/16 0518 01/25/16 0506  WBC 15.4* 12.4* 11.3* 5.8 6.9 8.2 8.1  NEUTROABS 10.6* 8.8*  --   --   --   --   --   HGB 8.9* 7.7* 8.6* 8.1* 8.1* 8.2* 8.1*  HCT 30.8* 26.3* 29.1* 27.2* 26.4* 26.8* 26.0*  MCV 92.5 89.2 93.3 93.2 91.3 89.6 89.7  PLT 351 294 277 262 257 263 260   Cardiac Enzymes: No results for input(s): CKTOTAL, CKMB, CKMBINDEX, TROPONINI in the last  168 hours. BNP (last 3 results)  Recent Labs  03/18/15 1240 08/29/15 1305  BNP 69.7 47.2    ProBNP (last 3 results) No results for input(s): PROBNP in the last 8760 hours.  CBG:  Recent Labs Lab 01/20/16 1719 01/21/16 0813 01/22/16 0809 01/23/16 0741 01/24/16 0803  GLUCAP 95 101* 110* 90 106*    Recent Results (from the past 240 hour(s))  Blood Culture (routine x 2)     Status: None   Collection Time: 01/20/16 11:47 AM  Result Value Ref Range Status   Specimen Description BLOOD RIGHT FOREARM  Final   Special Requests BOTTLES DRAWN AEROBIC AND ANAEROBIC  Final   Culture  Setup Time   Final    GRAM POSITIVE COCCI IN CLUSTERS CRITICAL RESULT CALLED TO, READ BACK BY AND VERIFIED WITH: Tora Kindred AT 1156 01/21/16 BY L BENFIELD IN BOTH AEROBIC AND ANAEROBIC BOTTLES    Culture   Final    STAPHYLOCOCCUS SPECIES (COAGULASE NEGATIVE) Performed at [facility]    Report Status 01/24/2016 FINAL  Final   Organism ID, Bacteria STAPHYLOCOCCUS SPECIES (COAGULASE NEGATIVE)  Final      Susceptibility   Staphylococcus species (coagulase negative) - MIC*    CIPROFLOXACIN >=8 RESISTANT Resistant     ERYTHROMYCIN >=8 RESISTANT Resistant     GENTAMICIN 4 SENSITIVE Sensitive     OXACILLIN >=4 RESISTANT Resistant     TETRACYCLINE 2 SENSITIVE Sensitive     VANCOMYCIN 1 SENSITIVE Sensitive     TRIMETH/SULFA 80 RESISTANT Resistant     CLINDAMYCIN >=8 RESISTANT Resistant     RIFAMPIN >=32 RESISTANT Resistant     Inducible Clindamycin NEGATIVE Sensitive     *  STAPHYLOCOCCUS SPECIES (COAGULASE NEGATIVE)  Urine culture     Status: None   Collection Time: 01/20/16 11:57 AM  Result Value Ref Range Status   Specimen Description URINE, CATHETERIZED  Final   Special Requests NONE  Final   Culture   Final    >=100,000 COLONIES/mL KLEBSIELLA PNEUMONIAE 50,000 COLONIES/mL PROTEUS MIRABILIS ORGANISM 1 Confirmed Extended Spectrum Beta-Lactamase Producer (ESBL) Performed at [facility]    Report Status 01/23/2016 FINAL  Final   Organism ID, Bacteria PROTEUS MIRABILIS  Final   Organism ID, Bacteria KLEBSIELLA PNEUMONIAE  Final      Susceptibility   Klebsiella pneumoniae - MIC*    AMPICILLIN >=32 RESISTANT Resistant     CEFAZOLIN >=64 RESISTANT Resistant     CEFTRIAXONE >=64 RESISTANT Resistant     CIPROFLOXACIN >=4 RESISTANT Resistant     GENTAMICIN <=1 SENSITIVE Sensitive     IMIPENEM <=0.25 SENSITIVE Sensitive     NITROFURANTOIN 64 INTERMEDIATE Intermediate     TRIMETH/SULFA >=320 RESISTANT Resistant     AMPICILLIN/SULBACTAM >=32 RESISTANT Resistant     PIP/TAZO 16 SENSITIVE Sensitive     * >=100,000 COLONIES/mL KLEBSIELLA PNEUMONIAE   Proteus mirabilis - MIC*    AMPICILLIN <=2 SENSITIVE Sensitive     CEFAZOLIN <=4 SENSITIVE Sensitive     CEFTRIAXONE <=1 SENSITIVE Sensitive     CIPROFLOXACIN >=4 RESISTANT Resistant     GENTAMICIN <=1 SENSITIVE Sensitive     IMIPENEM 4 SENSITIVE Sensitive     NITROFURANTOIN 64 RESISTANT Resistant     TRIMETH/SULFA <=20 SENSITIVE Sensitive     AMPICILLIN/SULBACTAM <=2 SENSITIVE Sensitive     PIP/TAZO <=4 SENSITIVE Sensitive     * 50,000 COLONIES/mL PROTEUS MIRABILIS  Blood Culture (routine x 2)     Status: None   Collection Time: 01/20/16 12:06 PM  Result Value Ref Range Status   Specimen Description BLOOD RIGHT ANTECUBITAL  Final   Special Requests BOTTLES DRAWN AEROBIC AND ANAEROBIC  Final   Culture  Setup Time   Final    GRAM POSITIVE COCCI IN CLUSTERS AEROBIC BOTTLE CALLED TO SHEEK,S RN 01/21/16 1439 WOOTEN,K CONFIRMED BY V WILKINS    Culture   Final    STAPHYLOCOCCUS SPECIES (COAGULASE NEGATIVE) SUSCEPTIBILITIES PERFORMED ON PREVIOUS CULTURE WITHIN THE LAST 5 DAYS. Performed at [facility]    Report Status 01/24/2016 FINAL  Final  Culture, blood (x 2)     Status: None   Collection Time: 01/20/16  3:24 PM  Result Value Ref Range Status   Specimen Description BLOOD LEFT ARM  Final    Special Requests IN PEDIATRIC BOTTLE 4CC  Final   Culture   Final    NO GROWTH 5 DAYS Performed at [facility]    Report Status 01/25/2016 FINAL  Final  Culture, blood (x 2)     Status: None   Collection Time: 01/20/16  3:24 PM  Result Value Ref Range Status   Specimen Description BLOOD LEFT ARM  Final   Special Requests IN PEDIATRIC BOTTLE 4CC  Final   Culture   Final    NO GROWTH 5 DAYS Performed at [facility]    Report Status 01/25/2016 FINAL  Final  MRSA PCR Screening     Status: None   Collection Time: 01/20/16  6:56 PM  Result Value Ref Range Status   MRSA by PCR NEGATIVE NEGATIVE Final    Comment:        The GeneXpert MRSA Assay (FDA approved for  NASAL specimens only), is one component of a comprehensive MRSA colonization surveillance program. It is not intended to diagnose MRSA infection nor to guide or monitor treatment for MRSA infections.   Urine culture     Status: None   Collection Time: 01/20/16  7:15 PM  Result Value Ref Range Status   Specimen Description URINE, CLEAN CATCH  Final   Special Requests NONE  Final   Culture   Final    >=100,000 COLONIES/mL PROVIDENCIA STUARTII >=100,000 COLONIES/mL PROTEUS MIRABILIS Performed at [facility]    Report Status 01/24/2016 FINAL  Final   Organism ID, Bacteria PROVIDENCIA STUARTII  Final   Organism ID, Bacteria PROTEUS MIRABILIS  Final      Susceptibility   Proteus mirabilis - MIC*    AMPICILLIN <=2 SENSITIVE Sensitive     CEFAZOLIN <=4 SENSITIVE Sensitive     CEFTRIAXONE <=1 SENSITIVE Sensitive     CIPROFLOXACIN >=4 RESISTANT Resistant     GENTAMICIN <=1 SENSITIVE Sensitive     IMIPENEM 4 SENSITIVE Sensitive     NITROFURANTOIN 128 RESISTANT Resistant     TRIMETH/SULFA <=20 SENSITIVE Sensitive     AMPICILLIN/SULBACTAM <=2 SENSITIVE Sensitive     PIP/TAZO <=4 SENSITIVE Sensitive     * >=100,000 COLONIES/mL PROTEUS MIRABILIS   Providencia stuartii - MIC*    AMPICILLIN >=32  RESISTANT Resistant     CEFAZOLIN >=64 RESISTANT Resistant     CEFTRIAXONE <=1 SENSITIVE Sensitive     CIPROFLOXACIN >=4 RESISTANT Resistant     GENTAMICIN 4 RESISTANT Resistant     IMIPENEM 0.5 SENSITIVE Sensitive     NITROFURANTOIN 256 RESISTANT Resistant     TRIMETH/SULFA <=20 SENSITIVE Sensitive     AMPICILLIN/SULBACTAM 16 INTERMEDIATE Intermediate     PIP/TAZO <=4 SENSITIVE Sensitive     * >=100,000 COLONIES/mL PROVIDENCIA STUARTII  C difficile quick scan w PCR reflex     Status: None   Collection Time: 01/21/16  9:01 AM  Result Value Ref Range Status   C Diff antigen NEGATIVE NEGATIVE Final   C Diff toxin NEGATIVE NEGATIVE Final   C Diff interpretation Negative for toxigenic C. difficile  Final  Culture, blood (routine x 2)     Status: None (Preliminary result)   Collection Time: 01/23/16  8:20 PM  Result Value Ref Range Status   Specimen Description BLOOD LEFT ARM  Final   Special Requests BOTTLES DRAWN AEROBIC AND ANAEROBIC 5 CC  Final   Culture   Final    NO GROWTH 1 DAY Performed at [facility]    Report Status PENDING  Incomplete  Culture, blood (routine x 2)     Status: None (Preliminary result)   Collection Time: 01/23/16  8:23 PM  Result Value Ref Range Status   Specimen Description BLOOD LEFT HAND  Final   Special Requests BOTTLES DRAWN AEROBIC AND ANAEROBIC 5 CC  Final   Culture   Final    NO GROWTH 1 DAY Performed at [facility]    Report Status PENDING  Incomplete     Studies: Ir Fluoro Guide Cv Line Left  01/25/2016  INDICATION: Bacteremia; request made for central venous access for antibiotic therapy. EXAM: LEFT UPPER EXTREMITY PICC LINE PLACEMENT WITH ULTRASOUND AND FLUOROSCOPIC GUIDANCE MEDICATIONS: NONE ANESTHESIA/SEDATION: NONE FLUOROSCOPY TIME:  Fluoroscopy Time:  42 SECONDS COMPLICATIONS: None immediate. PROCEDURE: The patient's son was advised of the possible risks and complications and agreed to undergo the procedure. The patient  was then brought to the  angiographic suite for the procedure. The left arm was prepped with chlorhexidine, draped in the usual sterile fashion using maximum barrier technique (cap and mask, sterile gown, sterile gloves, large sterile sheet, hand hygiene and cutaneous antisepsis) and infiltrated locally with 1% Lidocaine. Ultrasound demonstrated patency of the left cephalic vein, and this was documented with an image. Under real-time ultrasound guidance, this vein was accessed with a 21 gauge micropuncture needle and image documentation was performed. A 0.018 wire was introduced in to the vein. Over this, a 5 [place] double lumen power-injectable PICC was advanced to the lower SVC/right atrial junction. Fluoroscopy during the procedure and fluoro spot radiograph confirms appropriate catheter position. The catheter was flushed and covered with a sterile dressing. Catheter length:  52 cm IMPRESSION: Successful left arm Power PICC line placement with ultrasound and fluoroscopic guidance. The catheter is ready for use. Read by: Jeananne RamaKevin Allred, PA-C Electronically Signed   By: Malachy MoanHeath  McCullough M.D.   On: 01/24/2016 19:21   Ir Koreas Guide Vasc Access Left  01/25/2016  INDICATION: Bacteremia; request made for central venous access for antibiotic therapy. EXAM: LEFT UPPER EXTREMITY PICC LINE PLACEMENT WITH ULTRASOUND AND FLUOROSCOPIC GUIDANCE MEDICATIONS: NONE ANESTHESIA/SEDATION: NONE FLUOROSCOPY TIME:  Fluoroscopy Time:  42 SECONDS COMPLICATIONS: None immediate. PROCEDURE: The patient's son was advised of the possible risks and complications and agreed to undergo the procedure. The patient was then brought to the angiographic suite for the procedure. The left arm was prepped with chlorhexidine, draped in the usual sterile fashion using maximum barrier technique (cap and mask, sterile gown, sterile gloves, large sterile sheet, hand hygiene and cutaneous antisepsis) and infiltrated locally with 1% Lidocaine. Ultrasound  demonstrated patency of the left cephalic vein, and this was documented with an image. Under real-time ultrasound guidance, this vein was accessed with a 21 gauge micropuncture needle and image documentation was performed. A 0.018 wire was introduced in to the vein. Over this, a 5 [place] double lumen power-injectable PICC was advanced to the lower SVC/right atrial junction. Fluoroscopy during the procedure and fluoro spot radiograph confirms appropriate catheter position. The catheter was flushed and covered with a sterile dressing. Catheter length:  52 cm IMPRESSION: Successful left arm Power PICC line placement with ultrasound and fluoroscopic guidance. The catheter is ready for use. Read by: Jeananne RamaKevin Allred, PA-C Electronically Signed   By: Malachy MoanHeath  McCullough M.D.   On: 01/24/2016 19:21    Scheduled Meds:  Scheduled Meds: . sodium chloride   Intravenous Once  . acetaminophen  650 mg Per Tube Daily  . antiseptic oral rinse  7 mL Mouth Rinse q12n4p  . chlorhexidine  15 mL Mouth Rinse BID  . cholecalciferol  5,000 Units Oral Q breakfast  . cholestyramine light  4 g Oral TID  . feeding supplement (PRO-STAT SUGAR FREE 64)  60 mL Per Tube BID  . ferrous sulfate  300 mg Per Tube BID WC  . free water  200 mL Oral 3 times per day  . heparin subcutaneous  5,000 Units Subcutaneous 3 times per day  . lacosamide  100 mg Per Tube BID  . levETIRAcetam  1,500 mg Per Tube BID  . meropenem (MERREM) IV  1 g Intravenous Q8H  . metoprolol  50 mg Oral BID  . multivitamin with minerals  1 tablet Per Tube Q breakfast  . polyvinyl alcohol  2 drop Both Eyes TID  . ranitidine  150 mg Per Tube BID  . sodium chloride flush  3 mL Intravenous Q12H  .  vancomycin  1,500 mg Intravenous Q24H  . vitamin C  1,000 mg Oral Daily   Continuous Infusions: . feeding supplement (JEVITY 1.5 CAL/FIBER) 1,000 mL (01/25/16 1227)    Time spent on care of this patient: 40 min   Montina Dorrance, MD 01/25/2016, 3:19 PM  LOS: 5 days    [facility] Office  [phone] Pager - Text Page per [email or web address] If 7PM-7AM, please contact night-coverage [email or web address]

## 2016-01-25 NOTE — Progress Notes (Signed)
This patient and her family is well known to me.    I have had several detailed GOC meetings with both son/HPOA/ Torrie Preis and siser/ [name].  Today I spoke again to sister/[name] Tortora and left message for son/Torrie.  Family have heard on multiple occasions the outlined expected trajectory for this patient, with multiple co-morbidities, recurrent infections, quadriplegia,  stage IV decubitus wound,  and overall failure to thrive.   They desire all offered and available medical interventions to prolong life at this time.  I encourage this family to call with questions or concerns, and recommend Palliative Services at SNF on discharge.  Discussed with Dr Butler Denmarkizwan  PMT will sign off at this time.  Lorinda CreedMary Larach NP  Palliative Medicine Team Team Phone # [phone] Pager [phone]

## 2016-01-26 DIAGNOSIS — A419 Sepsis, unspecified organism: Secondary | ICD-10-CM

## 2016-01-26 LAB — CBC
HCT: 27.4 % — ABNORMAL LOW (ref 36.0–46.0)
HEMOGLOBIN: 8.6 g/dL — AB (ref 12.0–15.0)
MCH: 27.1 pg (ref 26.0–34.0)
MCHC: 31.4 g/dL (ref 30.0–36.0)
MCV: 86.4 fL (ref 78.0–100.0)
PLATELETS: 325 10*3/uL (ref 150–400)
RBC: 3.17 MIL/uL — ABNORMAL LOW (ref 3.87–5.11)
RDW: 17.3 % — ABNORMAL HIGH (ref 11.5–15.5)
WBC: 8.6 10*3/uL (ref 4.0–10.5)

## 2016-01-26 LAB — BASIC METABOLIC PANEL
ANION GAP: 8 (ref 5–15)
BUN: 19 mg/dL (ref 6–20)
CALCIUM: 8.5 mg/dL — AB (ref 8.9–10.3)
CO2: 22 mmol/L (ref 22–32)
CREATININE: 0.4 mg/dL — AB (ref 0.44–1.00)
Chloride: 108 mmol/L (ref 101–111)
GLUCOSE: 116 mg/dL — AB (ref 65–99)
Potassium: 3.9 mmol/L (ref 3.5–5.1)
Sodium: 138 mmol/L (ref 135–145)

## 2016-01-26 LAB — GLUCOSE, CAPILLARY: GLUCOSE-CAPILLARY: 108 mg/dL — AB (ref 65–99)

## 2016-01-26 LAB — SEDIMENTATION RATE: Sed Rate: 88 mm/hr — ABNORMAL HIGH (ref 0–22)

## 2016-01-26 MED ORDER — LOPERAMIDE HCL 2 MG PO CAPS
2.0000 mg | ORAL_CAPSULE | Freq: Two times a day (BID) | ORAL | Status: DC
  Administered 2016-01-26 – 2016-01-30 (×9): 2 mg via ORAL
  Filled 2016-01-26 (×9): qty 1

## 2016-01-26 MED ORDER — LOPERAMIDE HCL 2 MG PO CAPS
2.0000 mg | ORAL_CAPSULE | Freq: Four times a day (QID) | ORAL | Status: DC
  Administered 2016-01-26: 2 mg via ORAL
  Filled 2016-01-26: qty 1

## 2016-01-26 NOTE — Progress Notes (Signed)
Pharmacy Antibiotic Note  Meghan PoreLaura Marie Lawson is a 50 y.o. female with hx seizures and ESBLadmitted on 01/20/2016 with sepsis.  Meropenem started on admission, changed to ceftriaxone on 3/19 for UTI.  Pharmacy is consulted to dose Vancomycin for CoNS bacteremia and Meropenem for ESBL Klebsiella UTI.  Plan: - Meropenem 1g IV q8h - Continue vancomycin 1500mg  IV q24h - Measure Vanc trough at steady state.  (goal trough 15-20)  - Follow up renal fxn, culture results, and clinical course.   Height: 5\' 5"  (165.1 cm) (taken 3/8 per facility) Weight: 172 lb 6.4 oz (78.2 kg) IBW/kg (Calculated) : 57  Temp (24hrs), Avg:100 F (37.8 C), Min:99.3 F (37.4 C), Max:101.6 F (38.7 C)   Recent Labs Lab 01/20/16 1154 01/20/16 1524 01/20/16 1816  01/22/16 0717 01/22/16 1434 01/23/16 0517 01/23/16 1308 01/24/16 0518 01/25/16 0506 01/26/16 0450  WBC  --  12.4*  --   < > 5.8  --  6.9  --  8.2 8.1 8.6  CREATININE  --  0.93  --   < > 0.63 0.51 0.50  --  0.44 0.42* 0.40*  LATICACIDVEN 2.52* 2.0 1.4  --   --   --   --   --   --   --   --   VANCOTROUGH  --   --   --   --   --   --   --  23*  --   --   --   < > = values in this interval not displayed.  Estimated Creatinine Clearance: 88.9 mL/min (by C-G formula based on Cr of 0.4).    Allergies  Allergen Reactions  . Amlodipine Swelling  . Latex Rash    Unknown   . Other Other (See Comments)    Natural Rubber- Unknown     Antimicrobials this admission: 3/17 Zosyn x 1 3/17 >> Vanc >> (x2 weeks, 3/30) 3/17 >> Meropenem >>3/19>> resume 3/20 >> (x5 days) 3/19 >>Rocephin>> 3/20  Dose adjustments this admission: 1/29 VT =9 about 19 hours after 1250 mg dose (pt was on 1250 mg q12h PTA), changed to 1g q8h 1/31 VT = 39 on 1g q8h, changed to 750 mg q12h 3/20 VT at 1330=  23 (on 1gm q12h) --> 2PM 1gm dose given, change to 1500 mg q24h 3/24 VT at 1900 = _____ on 1.5g q24h  Microbiology results: 12/04/15 PICC: ESBL Klebsiella 12/04/15 Cath tip:  ESBL Klebsiella and Pseudomonas (pansensitive) 01/03/16 UCx: Proteus mirabilis (R cipro, nitro) 3/17 @ 1147 BCx: 2/2 CoNS oxacillin resistant FINAL 3/17 @1524  BCx: NGTD (*Initial Vanc given at 1237 on 3/17) 3/17 UCx: 50 K Proteus Mirabilis (R- Cipro, Nitrofurantoin); >100K ESBL klebsiella (S= gent, imip, zosyn) 3/17 MRSA PCR: neg 3/17 ucx: >100K providencia stuartii (S= zosyn, CTX, imip, bactrim) and proteus mirabilis (R= cipro, nitro) 3/18 Cdiff antigen/toxin: neg 3/20 bcx x2: ngtd  Thank you for allowing pharmacy to be a part of this patient's care.  Lynann Beaverhristine Atanacio Melnyk PharmD, BCPS Pager [phone] 01/26/2016 1:42 PM

## 2016-01-26 NOTE — Progress Notes (Signed)
Clinical Social Work Note- Late Entry  CSW continuing to follow.   Pt admitted from [facility] and pt family seeking alternative placement for pt and hopeful pt can go to another SNF upon discharge.  Pt son had expressed to CSW that pt family had been attempting to get pt placed at [facility] and hopeful CSW could speak with facility to inquire.   CSW spoke with [facility] and discussed pt and pt family interest in facility. [facility] reviewed pt information and are able to offer pt a bed. [facility] has Medicaid bed available for pt upon discharge.  CSW notified pt son, Torrie via telephone. Pt son elated that pt will be able to go to [facility] upon discharge from the hospital. [facility] planned to order pt equipment in order for facility to be ready once pt becomes medically ready for discharge.  Pt not yet medically ready for discharge.  CSW to continue to follow to assist with pt transition to [facility] when medically ready.  Loletta SpecterSuzanna Shardai Star, MSW, LCSW Clinical Social Work [phone]

## 2016-01-26 NOTE — Progress Notes (Signed)
CSW continuing to follow.   Pt plans to discharge to [facility] when medically ready.  Per MD, pt not yet medically ready for discharge.  CSW contacted pt son, Torrie via telephone and left voice message.  CSW updated [facility] and facility expressed understanding and discussed that facility is ready to accept pt when pt becomes medically stable.   CSW to continue to follow to provide support and assist with pt discharge to [facility] when medically ready.  Loletta SpecterSuzanna Kidd, MSW, LCSW Clinical Social Work [phone]

## 2016-01-26 NOTE — Progress Notes (Signed)
[facility] Progress Note   [name]  ZOX:[id number]  DOB: 31-Jul-1966  DOA: 01/20/2016 PCP: Ruthe Mannan, MD  Brief narrative: [name] is a 50 y.o. 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 Hospitalized on 1/28 through 2/2 for sepsis secondary to ESBL UTI Hospitalized/28 through 3/8 with Proteus mirabilis UTI, bright red blood per rectum Sent from skilled nursing facility with fever and hypotension tachycardia and a WBC count of 12.4. Subsequently developed diarrhea which is negative for C. difficile colitis. Blood cultures grew out coag-negative staph/MRSE and urine culture has grown Klebsiella, Proteus and [name] here.     Subjective: Noncommunicative  Assessment/Plan: Principal Problem:   Sepsis  (A) MRSE bacteremia- from sacral wound?- ID recommends 2 weeks of vancomycin-repeat blood culture negative (B) ESBL UTI secondary to chronic Foley versus colonization-ID recommends 5 days of meropenem-meropenem was started on 3/17 but missed 3 days 18,19 and 20 due to lack of IV-will receive 2 more days-last dose tonight   Continues to have fevers- d/w ID- no change in plan- -continue to follow temps  Active Problems:    Decubitus ulcer of sacral region, stage 4  with soiling secondary to stool - Wound care instructions: Cleanse wound with NS and pat gently dry. Apply black granufoam to wound bed. Bridge dressing to hip to offload pressure. Change Mon/Wed/Fri -Having loose stools which might soil the wound--will start Imodium- Rectal pouch if needed -Wound does not appear to be infected - d/w Dr Ninetta Lights no need for further treatment other than above mentioned anibiotics  Hypokalemia -Replace with 2 doses of KCl 40 mEq  each  History of intercerebral hemorrhage during clipping of aneurysm- status post PEG -Nonverbal, nonmobile, does not follow commands -Continue PEG tube feeds  Normocytic anemia - Baseline Hgb 8.4, 7.7 on admission, Transfused 1 U PRBC , hemoglobin stable since transfusion.  Essential hypertension -  medication initially on hold secondary to hypertension, currently blood pressure improved, we'll resume on metoprolol  Seizure disorder (HCC) - Continue Vimpat and Keppra   Antibiotics: Anti-infectives    Start     Dose/Rate Route Frequency Ordered Stop   01/24/16 2000  meropenem (MERREM) 1 g in sodium chloride 0.9 % 100 mL IVPB     1 g 200 mL/hr over 30 Minutes Intravenous Every 8 hours 01/24/16 1945     01/24/16 2000  vancomycin (VANCOCIN) 1,500 mg in sodium chloride 0.9 % 500 mL IVPB     1,500 mg 250 mL/hr over 120 Minutes Intravenous Every 24 hours 01/24/16 1947     01/24/16 0600  vancomycin (VANCOCIN) 1,500 mg in sodium chloride 0.9 % 500 mL IVPB  Status:  Discontinued     1,500 mg 250 mL/hr over 120 Minutes Intravenous Every 24 hours 01/23/16 1430 01/24/16 1946   01/23/16 1300  meropenem (MERREM) 1 g in sodium chloride 0.9 % 100 mL IVPB  Status:  Discontinued     1 g 200 mL/hr over 30 Minutes Intravenous Every 8 hours 01/23/16 1254 01/24/16 1944   01/22/16 1400  cefTRIAXone (ROCEPHIN) 1 g in dextrose 5 % 50 mL IVPB  Status:  Discontinued     1 g 100 mL/hr over 30 Minutes Intravenous Every 24 hours 01/22/16 1211 01/23/16 1252   01/21/16 0200  vancomycin (VANCOCIN) IVPB 1000 mg/200  mL premix  Status:  Discontinued     1,000 mg 200 mL/hr over 60 Minutes Intravenous Every 12 hours 01/20/16 1243 01/23/16 1430   01/20/16 1800  meropenem (MERREM) 1 g in sodium chloride 0.9 % 100 mL IVPB  Status:  Discontinued     1 g 200 mL/hr over 30 Minutes Intravenous Every 8 hours 01/20/16 1243 01/22/16 1211   01/20/16 1230  piperacillin-tazobactam (ZOSYN) IVPB 3.375 g     3.375 g 100  mL/hr over 30 Minutes Intravenous  Once 01/20/16 1217 01/20/16 1255   01/20/16 1230  vancomycin (VANCOCIN) IVPB 1000 mg/200 mL premix     1,000 mg 200 mL/hr over 60 Minutes Intravenous  Once 01/20/16 1217 01/20/16 1343     Code Status:     Code Status Orders        Start     Ordered   01/20/16 1515  Full code   Continuous     01/20/16 1514    Code Status History    Date Active Date Inactive Code Status Order ID Comments User Context   01/03/2016  9:48 PM 01/11/2016  6:38 PM Full Code [id number]  Briscoe Deutscher, MD ED   12/04/2015  8:31 AM 12/09/2015  4:07 PM Full Code [id number]  Maryruth Bun Rama, MD Inpatient   10/12/2015  7:29 PM 10/26/2015  5:28 PM Full Code [id number]  Rahul Astrid Drafts, PA-C ED   08/28/2015  6:20 PM 09/06/2015  6:49 PM Full Code [id number]  Hollice Espy, MD Inpatient   06/01/2015  8:34 PM 06/06/2015  5:56 PM Full Code [id number]  Ron Parker, MD Inpatient   03/16/2015  5:22 PM 03/22/2015  7:54 PM Full Code [id number]  Haydee Monica, MD Inpatient   03/31/2014 10:31 AM 04/07/2014  7:32 PM Full Code [id number]  Coralyn Helling, MD ED     Family Communication: Disposition Plan: Return to SNF- palliative care has been discussed with family on multiple occasions- they have declined DVT prophylaxis: Heparin Consultants: ID Procedures:     Objective: Filed Weights   01/24/16 0533 01/25/16 0509 01/26/16 0402  Weight: 85.1 kg (187 lb 9.8 oz) 78.8 kg (173 lb 11.6 oz) 78.2 kg (172 lb 6.4 oz)    Intake/Output Summary (Last 24 hours) at 01/26/16 1348 Last data filed at 01/26/16 0647  Gross per 24 hour  Intake   1530 ml  Output   2400 ml  Net   -870 ml     Vitals Filed Vitals:   01/25/16 2302 01/26/16 0402 01/26/16 0510 01/26/16 1043  BP:  108/73  131/78  Pulse:  110  107  Temp: 99.5 F (37.5 C) 100 F (37.8 C) 99.5 F (37.5 C)   TempSrc: Axillary Axillary Axillary   Resp:  18    Height:      Weight:  78.2 kg (172 lb 6.4 oz)    SpO2:  98%      Exam:  General:   Pt is alert,Does not communicate, not in acute distress  HEENT: No icterus, No thrush, oral mucosa moist  Cardiovascular: regular rate and rhythm, S1/S2 No murmur  Respiratory: clear to auscultation bilaterally   Abdomen: Soft, +Bowel sounds, non tender, non distended, no guarding-Peg tube intact  MSK: No cyanosis or clubbing- no pedal edema-contracted extremities  Skin: Wound VAC on sacral decubitus ulcer   Data Reviewed: Basic Metabolic Panel:  Recent Labs Lab 01/20/16 1524  01/22/16 1434 01/23/16 0517 01/24/16 0518 01/25/16 0506 01/26/16 0450  NA  158*  < > 151* 147* 142 141 138  K 3.9  < > 3.4* 3.4* 3.9 3.3* 3.9  CL 125*  < > 123* 118* 113* 113* 108  CO2 23  < > 20* 19* 20* 22 22  GLUCOSE 118*  < > 133* 108* 119* 124* 116*  BUN 50*  < > 24* 20 19 19 19   CREATININE 0.93  < > 0.51 0.50 0.44 0.42* 0.40*  CALCIUM 8.8*  < > 8.6* 8.6* 8.5* 8.4* 8.5*  MG 2.8*  --   --   --   --   --   --   PHOS 4.1  --   --   --   --   --   --   < > = values in this interval not displayed. Liver Function Tests:  Recent Labs Lab 01/20/16 1147 01/20/16 1524 01/21/16 0518  AST 54* 44* 26  ALT 76* 63* 50  ALKPHOS 186* 151* 125  BILITOT 0.6 0.6 0.7  PROT 8.4* 7.1 6.7  ALBUMIN 2.8* 2.3* 2.2*   No results for input(s): LIPASE, AMYLASE in the last 168 hours. No results for input(s): AMMONIA in the last 168 hours. CBC:  Recent Labs Lab 01/20/16 1147 01/20/16 1524  01/22/16 0717 01/23/16 0517 01/24/16 0518 01/25/16 0506 01/26/16 0450  WBC 15.4* 12.4*  < > 5.8 6.9 8.2 8.1 8.6  NEUTROABS 10.6* 8.8*  --   --   --   --   --   --   HGB 8.9* 7.7*  < > 8.1* 8.1* 8.2* 8.1* 8.6*  HCT 30.8* 26.3*  < > 27.2* 26.4* 26.8* 26.0* 27.4*  MCV 92.5 89.2  < > 93.2 91.3 89.6 89.7 86.4  PLT 351 294  < > 262 257 263 260 325  < > = values in this interval not displayed. Cardiac Enzymes: No results for input(s): CKTOTAL, CKMB, CKMBINDEX, TROPONINI in the last 168 hours. BNP (last 3  results)  Recent Labs  03/18/15 1240 08/29/15 1305  BNP 69.7 47.2    ProBNP (last 3 results) No results for input(s): PROBNP in the last 8760 hours.  CBG:  Recent Labs Lab 01/21/16 0813 01/22/16 0809 01/23/16 0741 01/24/16 0803 01/26/16 0749  GLUCAP 101* 110* 90 106* 108*    Recent Results (from the past 240 hour(s))  Blood Culture (routine x 2)     Status: None   Collection Time: 01/20/16 11:47 AM  Result Value Ref Range Status   Specimen Description BLOOD RIGHT FOREARM  Final   Special Requests BOTTLES DRAWN AEROBIC AND ANAEROBIC 5ML  Final   Culture  Setup Time   Final    GRAM POSITIVE COCCI IN CLUSTERS CRITICAL RESULT CALLED TO, READ BACK BY AND VERIFIED WITH: S DILLAN,RNA AT 1156 01/21/16 BY L BENFIELD IN BOTH AEROBIC AND ANAEROBIC BOTTLES    Culture   Final    STAPHYLOCOCCUS SPECIES (COAGULASE NEGATIVE) Performed at [facility]    Report Status 01/24/2016 FINAL  Final   Organism ID, Bacteria STAPHYLOCOCCUS SPECIES (COAGULASE NEGATIVE)  Final      Susceptibility   Staphylococcus species (coagulase negative) - MIC*    CIPROFLOXACIN >=8 RESISTANT Resistant     ERYTHROMYCIN >=8 RESISTANT Resistant     GENTAMICIN 4 SENSITIVE Sensitive     OXACILLIN >=4 RESISTANT Resistant     TETRACYCLINE 2 SENSITIVE Sensitive     VANCOMYCIN 1 SENSITIVE Sensitive     TRIMETH/SULFA 80 RESISTANT Resistant     CLINDAMYCIN >=8 RESISTANT  Resistant     RIFAMPIN >=32 RESISTANT Resistant     Inducible Clindamycin NEGATIVE Sensitive     * STAPHYLOCOCCUS SPECIES (COAGULASE NEGATIVE)  Urine culture     Status: None   Collection Time: 01/20/16 11:57 AM  Result Value Ref Range Status   Specimen Description URINE, CATHETERIZED  Final   Special Requests NONE  Final   Culture   Final    >=100,000 COLONIES/mL KLEBSIELLA PNEUMONIAE 50,000 COLONIES/mL PROTEUS MIRABILIS ORGANISM 1 Confirmed Extended Spectrum Beta-Lactamase Producer (ESBL) Performed at [facility]    Report  Status 01/23/2016 FINAL  Final   Organism ID, Bacteria PROTEUS MIRABILIS  Final   Organism ID, Bacteria KLEBSIELLA PNEUMONIAE  Final      Susceptibility   Klebsiella pneumoniae - MIC*    AMPICILLIN >=32 RESISTANT Resistant     CEFAZOLIN >=64 RESISTANT Resistant     CEFTRIAXONE >=64 RESISTANT Resistant     CIPROFLOXACIN >=4 RESISTANT Resistant     GENTAMICIN <=1 SENSITIVE Sensitive     IMIPENEM <=0.25 SENSITIVE Sensitive     NITROFURANTOIN 64 INTERMEDIATE Intermediate     TRIMETH/SULFA >=320 RESISTANT Resistant     AMPICILLIN/SULBACTAM >=32 RESISTANT Resistant     PIP/TAZO 16 SENSITIVE Sensitive     * >=100,000 COLONIES/mL KLEBSIELLA PNEUMONIAE   Proteus mirabilis - MIC*    AMPICILLIN <=2 SENSITIVE Sensitive     CEFAZOLIN <=4 SENSITIVE Sensitive     CEFTRIAXONE <=1 SENSITIVE Sensitive     CIPROFLOXACIN >=4 RESISTANT Resistant     GENTAMICIN <=1 SENSITIVE Sensitive     IMIPENEM 4 SENSITIVE Sensitive     NITROFURANTOIN 64 RESISTANT Resistant     TRIMETH/SULFA <=20 SENSITIVE Sensitive     AMPICILLIN/SULBACTAM <=2 SENSITIVE Sensitive     PIP/TAZO <=4 SENSITIVE Sensitive     * 50,000 COLONIES/mL PROTEUS MIRABILIS  Blood Culture (routine x 2)     Status: None   Collection Time: 01/20/16 12:06 PM  Result Value Ref Range Status   Specimen Description BLOOD RIGHT ANTECUBITAL  Final   Special Requests BOTTLES DRAWN AEROBIC AND ANAEROBIC  Final   Culture  Setup Time   Final    GRAM POSITIVE COCCI IN CLUSTERS AEROBIC BOTTLE CALLED TO SHEEK,S RN 01/21/16 1439 WOOTEN,K CONFIRMED BY V WILKINS    Culture   Final    STAPHYLOCOCCUS SPECIES (COAGULASE NEGATIVE) SUSCEPTIBILITIES PERFORMED ON PREVIOUS CULTURE WITHIN THE LAST 5 DAYS. Performed at [facility]    Report Status 01/24/2016 FINAL  Final  Culture, blood (x 2)     Status: None   Collection Time: 01/20/16  3:24 PM  Result Value Ref Range Status   Specimen Description BLOOD LEFT ARM  Final   Special Requests IN PEDIATRIC  BOTTLE 4CC  Final   Culture   Final    NO GROWTH 5 DAYS Performed at [facility]    Report Status 01/25/2016 FINAL  Final  Culture, blood (x 2)     Status: None   Collection Time: 01/20/16  3:24 PM  Result Value Ref Range Status   Specimen Description BLOOD LEFT ARM  Final   Special Requests IN PEDIATRIC BOTTLE 4CC  Final   Culture   Final    NO GROWTH 5 DAYS Performed at [facility]    Report Status 01/25/2016 FINAL  Final  MRSA PCR Screening     Status: None   Collection Time: 01/20/16  6:56 PM  Result Value Ref Range Status   MRSA by  PCR NEGATIVE NEGATIVE Final    Comment:        The GeneXpert MRSA Assay (FDA approved for NASAL specimens only), is one component of a comprehensive MRSA colonization surveillance program. It is not intended to diagnose MRSA infection nor to guide or monitor treatment for MRSA infections.   Urine culture     Status: None   Collection Time: 01/20/16  7:15 PM  Result Value Ref Range Status   Specimen Description URINE, CLEAN CATCH  Final   Special Requests NONE  Final   Culture   Final    >=100,000 COLONIES/mL PROVIDENCIA STUARTII >=100,000 COLONIES/mL PROTEUS MIRABILIS Performed at [facility]    Report Status 01/24/2016 FINAL  Final   Organism ID, Bacteria PROVIDENCIA STUARTII  Final   Organism ID, Bacteria PROTEUS MIRABILIS  Final      Susceptibility   Proteus mirabilis - MIC*    AMPICILLIN <=2 SENSITIVE Sensitive     CEFAZOLIN <=4 SENSITIVE Sensitive     CEFTRIAXONE <=1 SENSITIVE Sensitive     CIPROFLOXACIN >=4 RESISTANT Resistant     GENTAMICIN <=1 SENSITIVE Sensitive     IMIPENEM 4 SENSITIVE Sensitive     NITROFURANTOIN 128 RESISTANT Resistant     TRIMETH/SULFA <=20 SENSITIVE Sensitive     AMPICILLIN/SULBACTAM <=2 SENSITIVE Sensitive     PIP/TAZO <=4 SENSITIVE Sensitive     * >=100,000 COLONIES/mL PROTEUS MIRABILIS   Providencia stuartii - MIC*    AMPICILLIN >=32 RESISTANT Resistant     CEFAZOLIN  >=64 RESISTANT Resistant     CEFTRIAXONE <=1 SENSITIVE Sensitive     CIPROFLOXACIN >=4 RESISTANT Resistant     GENTAMICIN 4 RESISTANT Resistant     IMIPENEM 0.5 SENSITIVE Sensitive     NITROFURANTOIN 256 RESISTANT Resistant     TRIMETH/SULFA <=20 SENSITIVE Sensitive     AMPICILLIN/SULBACTAM 16 INTERMEDIATE Intermediate     PIP/TAZO <=4 SENSITIVE Sensitive     * >=100,000 COLONIES/mL PROVIDENCIA STUARTII  C difficile quick scan w PCR reflex     Status: None   Collection Time: 01/21/16  9:01 AM  Result Value Ref Range Status   C Diff antigen NEGATIVE NEGATIVE Final   C Diff toxin NEGATIVE NEGATIVE Final   C Diff interpretation Negative for toxigenic C. difficile  Final  Culture, blood (routine x 2)     Status: None (Preliminary result)   Collection Time: 01/23/16  8:20 PM  Result Value Ref Range Status   Specimen Description BLOOD LEFT ARM  Final   Special Requests BOTTLES DRAWN AEROBIC AND ANAEROBIC 5 CC  Final   Culture   Final    NO GROWTH 2 DAYS Performed at [facility]    Report Status PENDING  Incomplete  Culture, blood (routine x 2)     Status: None (Preliminary result)   Collection Time: 01/23/16  8:23 PM  Result Value Ref Range Status   Specimen Description BLOOD LEFT HAND  Final   Special Requests BOTTLES DRAWN AEROBIC AND ANAEROBIC 5 CC  Final   Culture   Final    NO GROWTH 2 DAYS Performed at [facility]    Report Status PENDING  Incomplete     Studies: Ir Fluoro Guide Cv Line Left  01/25/2016  INDICATION: Bacteremia; request made for central venous access for antibiotic therapy. EXAM: LEFT UPPER EXTREMITY PICC LINE PLACEMENT WITH ULTRASOUND AND FLUOROSCOPIC GUIDANCE MEDICATIONS: NONE ANESTHESIA/SEDATION: NONE FLUOROSCOPY TIME:  Fluoroscopy Time:  42 SECONDS COMPLICATIONS: None immediate. PROCEDURE: The patient's son  was advised of the possible risks and complications and agreed to undergo the procedure. The patient was then brought to the  angiographic suite for the procedure. The left arm was prepped with chlorhexidine, draped in the usual sterile fashion using maximum barrier technique (cap and mask, sterile gown, sterile gloves, large sterile sheet, hand hygiene and cutaneous antisepsis) and infiltrated locally with 1% Lidocaine. Ultrasound demonstrated patency of the left cephalic vein, and this was documented with an image. Under real-time ultrasound guidance, this vein was accessed with a 21 gauge micropuncture needle and image documentation was performed. A 0.018 wire was introduced in to the vein. Over this, a 5 [place] double lumen power-injectable PICC was advanced to the lower SVC/right atrial junction. Fluoroscopy during the procedure and fluoro spot radiograph confirms appropriate catheter position. The catheter was flushed and covered with a sterile dressing. Catheter length:  52 cm IMPRESSION: Successful left arm Power PICC line placement with ultrasound and fluoroscopic guidance. The catheter is ready for use. Read by: Jeananne Rama, PA-C Electronically Signed   By: Malachy Moan M.D.   On: 01/24/2016 19:21   Ir US Guide Vasc Access Left  01/25/2016  INDICATION: Bacteremia; request made for central venous access for antibiotic therapy. EXAM: LEFT UPPER EXTREMITY PICC LINE PLACEMENT WITH ULTRASOUND AND FLUOROSCOPIC GUIDANCE MEDICATIONS: NONE ANESTHESIA/SEDATION: NONE FLUOROSCOPY TIME:  Fluoroscopy Time:  42 SECONDS COMPLICATIONS: None immediate. PROCEDURE: The patient's son was advised of the possible risks and complications and agreed to undergo the procedure. The patient was then brought to the angiographic suite for the procedure. The left arm was prepped with chlorhexidine, draped in the usual sterile fashion using maximum barrier technique (cap and mask, sterile gown, sterile gloves, large sterile sheet, hand hygiene and cutaneous antisepsis) and infiltrated locally with 1% Lidocaine. Ultrasound demonstrated patency of the  left cephalic vein, and this was documented with an image. Under real-time ultrasound guidance, this vein was accessed with a 21 gauge micropuncture needle and image documentation was performed. A 0.018 wire was introduced in to the vein. Over this, a 5 [place] double lumen power-injectable PICC was advanced to the lower SVC/right atrial junction. Fluoroscopy during the procedure and fluoro spot radiograph confirms appropriate catheter position. The catheter was flushed and covered with a sterile dressing. Catheter length:  52 cm IMPRESSION: Successful left arm Power PICC line placement with ultrasound and fluoroscopic guidance. The catheter is ready for use. Read by: Jeananne Rama, PA-C Electronically Signed   By: Malachy Moan M.D.   On: 01/24/2016 19:21    Scheduled Meds:  Scheduled Meds: . sodium chloride   Intravenous Once  . acetaminophen  650 mg Per Tube Daily  . antiseptic oral rinse  7 mL Mouth Rinse q12n4p  . chlorhexidine  15 mL Mouth Rinse BID  . cholecalciferol  5,000 Units Oral Q breakfast  . cholestyramine light  4 g Oral TID  . feeding supplement (PRO-STAT SUGAR FREE 64)  60 mL Per Tube BID  . ferrous sulfate  300 mg Per Tube BID WC  . free water  200 mL Oral 3 times per day  . heparin subcutaneous  5,000 Units Subcutaneous 3 times per day  . lacosamide  100 mg Per Tube BID  . levETIRAcetam  1,500 mg Per Tube BID  . loperamide  2 mg Oral QID  . meropenem (MERREM) IV  1 g Intravenous Q8H  . metoprolol  50 mg Oral BID  . multivitamin with minerals  1 tablet Per Tube Q breakfast  .  polyvinyl alcohol  2 drop Both Eyes TID  . ranitidine  150 mg Per Tube BID  . sodium chloride flush  3 mL Intravenous Q12H  . vancomycin  1,500 mg Intravenous Q24H  . vitamin C  1,000 mg Oral Daily   Continuous Infusions: . feeding supplement (JEVITY 1.5 CAL/FIBER) 1,000 mL (01/26/16 0809)    Time spent on care of this patient: 40 min   Deldrick Linch, MD 01/26/2016, 1:48 PM  LOS: 6 days    [facility] Office  [phone] Pager - Text Page per [email or web address] If 7PM-7AM, please contact night-coverage [email or web address]

## 2016-01-26 NOTE — Progress Notes (Signed)
CSW continuing to follow.   CSW received notification from [facility] that facility has to withdraw SNF bed offer at this time.   CSW contacted pt son, Torrie via telephone and left voice message.   CSW to discuss with pt son other options for SNF or [facility] in order to determine new disposition plan.   CSW to continue to follow.   Loletta SpecterSuzanna Kyliee Ortego, MSW, LCSW Clinical Social Work [phone]

## 2016-01-27 DIAGNOSIS — R627 Adult failure to thrive: Secondary | ICD-10-CM

## 2016-01-27 LAB — VANCOMYCIN, TROUGH: Vancomycin Tr: 12 ug/mL (ref 10.0–20.0)

## 2016-01-27 LAB — GLUCOSE, CAPILLARY: Glucose-Capillary: 109 mg/dL — ABNORMAL HIGH (ref 65–99)

## 2016-01-27 MED ORDER — OSMOLITE 1.5 CAL PO LIQD
1000.0000 mL | ORAL | Status: DC
  Administered 2016-01-27 – 2016-01-30 (×3): 1000 mL
  Filled 2016-01-27 (×6): qty 1000

## 2016-01-27 MED ORDER — VANCOMYCIN HCL IN DEXTROSE 750-5 MG/150ML-% IV SOLN
750.0000 mg | Freq: Two times a day (BID) | INTRAVENOUS | Status: DC
  Administered 2016-01-27 – 2016-01-28 (×2): 750 mg via INTRAVENOUS
  Filled 2016-01-27 (×3): qty 150

## 2016-01-27 NOTE — [place] FL2 (Signed)
[place] MEDICAID FL2 LEVEL OF CARE SCREENING TOOL     IDENTIFICATION  Patient Name: Meghan PoreLaura Marie Kalter Birthdate: 12/09/1965 Sex: female Admission Date (Current Location): 01/20/2016  [place]  [place] [id number] n Facility and Address:  [facility],  [address], [place] [id number]      Provider Number: [id number]  Attending Physician Name and Address:  Calvert CantorSaima Rizwan, MD  Relative Name and Phone Number:  Gaynelle Aduorrie, Son, [phone]    Current Level of Care: Hospital Recommended Level of Care: Skilled Nursing Facility Prior Approval Number:    Date Approved/Denied:   PASRR Number: [id number]  Discharge Plan: SNF    Current Diagnoses: Patient Active Problem List   Diagnosis Date Noted  . Bacteremia 01/22/2016  . Hypernatremia 01/20/2016  . Essential hypertension   . Seizure disorder (HCC)   . Sepsis secondary to UTI (HCC) 12/05/2015  . UTI (urinary tract infection) due to urinary indwelling Foley catheter (HCC) 12/05/2015  . Leukocytosis 12/05/2015  . FTT (failure to thrive) in adult 12/05/2015  . Normocytic anemia 12/04/2015  . S/P percutaneous endoscopic gastrostomy (PEG) tube placement (HCC) 12/04/2015  . Decubitus ulcer of sacral region, stage 4 (HCC) 08/28/2015  . Hx of ischemic right ACA stroke 05/06/2015  . History of ischemic left ACA stroke 05/06/2015    Orientation RESPIRATION BLADDER Height & Weight     Self  Normal Incontinent, Indwelling catheter Weight: 172 lb 6.4 oz (78.2 kg) Height:  5\' 5"  (165.1 cm) (taken 3/8 per facility)  BEHAVIORAL SYMPTOMS/MOOD NEUROLOGICAL BOWEL NUTRITION STATUS  Other (Comment) (no behaviors) Convulsions/Seizures (hx of seizure disorder, on Vimpat and Keppra) Incontinent Feeding tube (feeding supplement (JEVITY 1.5 CAL/FIBER) liquid 1,000 mL)  AMBULATORY STATUS COMMUNICATION OF NEEDS Skin   Total Care Non-Verbally PU Stage and Appropriate Care, Wound Vac (Pt has negative pressure wound vac  continuous 125 mm/Hg change M W F)       PU Stage 4 Dressing: Daily (Wound VAC changed M W F)               Personal Care Assistance Level of Assistance  Total care           Functional Limitations Info  Sight, Hearing, Speech Sight Info: Adequate Hearing Info: Adequate Speech Info: Impaired (nonverbal)    SPECIAL CARE FACTORS FREQUENCY                       Contractures Contractures Info: Not present    Additional Factors Info  Isolation Precautions, Code Status, Allergies, Psychotropic, Insulin Sliding Scale Code Status Info: FULL code status Allergies Info: Amlodipine, Latex, Other Psychotropic Info: None listed Insulin Sliding Scale Info: None Noted Isolation Precautions Info: On contact precautions MRSA, Extended spectrum beta lactamase     Current Medications (01/27/2016):  This is the current hospital active medication list Current Facility-Administered Medications  Medication Dose Route Frequency Provider Last Rate Last Dose  . 0.9 %  sodium chloride infusion   Intravenous Once Alison MurrayAlma M Devine, MD      . acetaminophen (TYLENOL) solution 650 mg  650 mg Per Tube Daily Alison MurrayAlma M Devine, MD   650 mg at 01/27/16 [id number]  . acetaminophen (TYLENOL) tablet 650 mg  650 mg Oral Q6H PRN Alison MurrayAlma M Devine, MD   650 mg at 01/22/16 0444   Or  . acetaminophen (TYLENOL) suppository 650 mg  650 mg Rectal Q6H PRN Alison MurrayAlma M Devine, MD      . antiseptic oral rinse (  CPC / CETYLPYRIDINIUM CHLORIDE 0.05%) solution 7 mL  7 mL Mouth Rinse q12n4p Leana Roe Elgergawy, MD   7 mL at 01/27/16 1200  . bisacodyl (DULCOLAX) suppository 10 mg  10 mg Rectal Daily PRN Alison Murray, MD      . chlorhexidine (PERIDEX) 0.12 % solution 15 mL  15 mL Mouth Rinse BID Starleen Arms, MD   15 mL at 01/27/16 0923  . cholecalciferol (VITAMIN D) tablet 5,000 Units  5,000 Units Oral Q breakfast Alison Murray, MD   5,000 Units at 01/27/16 [id number]  . cholestyramine light (PREVALITE) packet 4 g  4 g Oral TID Alison Murray, MD   4 g at 01/27/16 9604  . feeding supplement (JEVITY 1.5 CAL/FIBER) liquid 1,000 mL  1,000 mL Per Tube Continuous Starleen Arms, MD 45 mL/hr at 01/27/16 0505 1,000 mL at 01/27/16 0505  . feeding supplement (PRO-STAT SUGAR FREE 64) liquid 60 mL  60 mL Per Tube BID Alison Murray, MD   60 mL at 01/27/16 0923  . ferrous sulfate 300 (60 Fe) MG/5ML syrup 300 mg  300 mg Per Tube BID WC Alison Murray, MD   300 mg at 01/27/16 0847  . free water 200 mL  200 mL Oral 3 times per day Alison Murray, MD   200 mL at 01/27/16 0502  . heparin injection 5,000 Units  5,000 Units Subcutaneous 3 times per day Starleen Arms, MD   5,000 Units at 01/27/16 0502  . lacosamide (VIMPAT) tablet 100 mg  100 mg Per Tube BID Alison Murray, MD   100 mg at 01/27/16 5409  . levETIRAcetam (KEPPRA) 100 MG/ML solution 1,500 mg  1,500 mg Per Tube BID Alison Murray, MD   1,500 mg at 01/27/16 8119  . loperamide (IMODIUM) capsule 2 mg  2 mg Oral BID Calvert Cantor, MD   2 mg at 01/27/16 0924  . metoprolol (LOPRESSOR) tablet 50 mg  50 mg Oral BID Starleen Arms, MD   50 mg at 01/27/16 0924  . multivitamin with minerals tablet 1 tablet  1 tablet Per Tube Q breakfast Alison Murray, MD   1 tablet at 01/27/16 0847  . ondansetron (ZOFRAN) tablet 4 mg  4 mg Per Tube Q6H PRN Alison Murray, MD      . polyvinyl alcohol (LIQUIFILM TEARS) 1.4 % ophthalmic solution 2 drop  2 drop Both Eyes TID Alison Murray, MD   2 drop at 01/27/16 0934  . ranitidine (ZANTAC) 150 MG/10ML syrup 150 mg  150 mg Per Tube BID Alison Murray, MD   150 mg at 01/27/16 1478  . sodium chloride flush (NS) 0.9 % injection 10-40 mL  10-40 mL Intracatheter PRN Starleen Arms, MD   10 mL at 01/26/16 1828  . sodium chloride flush (NS) 0.9 % injection 3 mL  3 mL Intravenous Q12H Alison Murray, MD   3 mL at 01/26/16 2151  . traZODone (DESYREL) tablet 25 mg  25 mg Per Tube QHS PRN Alison Murray, MD      . vancomycin [facility]) 1,500 mg in sodium chloride 0.9 % 500  mL IVPB  1,500 mg Intravenous Q24H Leann T Poindexter, RPH   1,500 mg at 01/26/16 2141  . vitamin C (ASCORBIC ACID) tablet 1,000 mg  1,000 mg Oral Daily Alison Murray, MD   1,000 mg at 01/27/16 2956     Discharge Medications: Please see discharge summary for  a list of discharge medications.  Relevant Imaging Results:  Relevant Lab Results:   Additional Information SSN: [id number]  Pt will have PICC for 2 weeks of IV antibiotic-vancomycin  Saphire Barnhart A, LCSW

## 2016-01-27 NOTE — Progress Notes (Signed)
Nutrition Follow-up  DOCUMENTATION CODES:   Obesity unspecified  INTERVENTION:  - Recommend Osmolite 1.5 @ 45 mL/hr with 60 mL Prostat BID. This regimen will provide 2020 kcal, 68 grams of protein, 128 grams of protein, and 823 mL free water - Continue free water flush of 200 mL every 8 hours. - RD will continue to monitor for needs  NUTRITION DIAGNOSIS:   Increased nutrient needs related to wound healing as evidenced by estimated needs. -ongoing  GOAL:   Patient will meet greater than or equal to 90% of their needs -met with current TF regimen and will continue to be met with change to TF  MONITOR:   Labs, I & O's, TF tolerance, Skin, Weight trends  ASSESSMENT:   50 year old female with past medical history of CVA, non verbal, has feeding tube, seizure disorder, recent hospitalization (12/03/2015 - 12/08/2015) for sepsis, UTI due to ESBL, subsequent admission for sepsis and GI bleed. She presented to [facility] ED from SNF for fevers. Pt is not able to provide any history. No respiratory distress. No falls. No loss of consciousness.  Received page from MD this AM concerning TF regimen for pt with ongoing diarrhea. Spoke with RN who reports last BM was this AM around 0900 and that this occurrence was watery in nature. RN reports pt currently on Vancomycin and was previously on Merrem as well.  Pt currently receiving Jevity 1.5 @ 45 mL/hr with 60 mL Prostat BID and 200 mL free water every 8 hours. This regimen is providing 2020, 129 grams of protein, and 1421 mL free water which is meeting estimated nutrition needs.  Spoke with MD about current TF regimen, antibiotic orders, and alternative TF formula. MD would like to switch pt to Osmolite 1.5 at this time and monitor for any possible benefit this may provide for diarrhea occurences. RD to place order for new TF formula per MD request. Alerted RN to change.  Pt to meet needs with change in TF regimen. Will monitor tolerance and bowel habits  and any additional needs. Medications reviewed. Labs reviewed; CBGs: 106-109 mg/dL, creatinine low, Ca: 8.5 mg/dL.   Diet Order:  Diet regular Room service appropriate?: Yes; Fluid consistency:: Thin  Skin:   Stage 4 sacral pressure ulcer  Last BM:  3/24  Height:   Ht Readings from Last 1 Encounters:  01/20/16 _0  (1.651 m)    Weight:   Wt Readings from Last 1 Encounters:  01/26/16 172 lb 6.4 oz (78.2 kg)    Ideal Body Weight:  54.5 kg  BMI:  Body mass index is 28.69 kg/(m^2).  Estimated Nutritional Needs:   Kcal:  2000-2150 (33-35 kcal/kg actual body weight)  Protein:  115-130 grams (2-2.3 grams/kg IBW)  Fluid:  >/= 2L  EDUCATION NEEDS:   No education needs identified at this time     Jarome Matin, RD, LDN Inpatient Clinical Dietitian Pager # [phone] After hours/weekend pager # [phone]

## 2016-01-27 NOTE — Progress Notes (Signed)
CSW continuing to follow.   CSW received return phone call from pt son, Torrie this morning. CSW discussed with pt son that [facility] withdrew bed offers. CSW discussed other offer from [facility] and discussed that CSW has message into [facility] as facility sent message stating they would consider pt. Pt son was not familiar with [facility] and planned to tour facility. Pt son is familiar with [facility] and eager to get determination from facility. Pt son is aware that if pt becomes medically ready before another SNF bed is secured then pt will have to [facility] and expressed understanding surrounding this.   CS[facility] confirmed with [facility] that facility can accept pt back if needed.  CSW to continue to discuss with pt son in order to make disposition plan finalized.   Loletta SpecterSuzanna Karina Nofsinger, MSW, LCSW Clinical Social Work [phone]

## 2016-01-27 NOTE — Progress Notes (Signed)
Wound vac dressing to sacral stage IV pressure ulcer changed. Wound and foam noted to be contaminated w/ fecal material when dressing and foam removed. Wound and surrounding area cleansed and dried prior to new dressing placement. Pt tolerated well. Rectal pouch placed in attempt to prevent fecal contamination of sacral wound.

## 2016-01-27 NOTE — Progress Notes (Signed)
CSW continuing to follow.   CSW has been in communication with pt son, Torrie throughout the day. CSW received notification from [facility] that facility is unable to extend pt a bed offer. CSW notified pt son. Pt son reports that he [facility], but unsure if [facility] would be a better fit for pt. Pt son expressed frustration that better options not available for pt. CSW received notification from [facility] that if pt discharge during the weekend that facility would not be able to accept as they would be unable to obtain pt equipment including wound VAC in time for d/c over the weekend. CSW discussed with pt son that if pt is discharged during the weekend that pt would have to return [facility] as [facility] is the only facility during the weekend available that will have pt equipment available. Pt son inquired about [facility] and CSW explained that [facility] is difficult to get placed into and did not respond to referral. CSW spoke with a representative from [facility] that stated [facility] does not accept Medicaid at their SNF as primary insurance. Pt son hopeful that pt will not be ready for discharge over the weekend. Pt son agreeable to weekend CSW following up regarding plans over the weekend.   CSW spoke with pt sister, [name] via telephone as pt son had notified CSW that pt sister planned to call CSW. CSW explained options to pt sister. Pt sister expressed frustration of pt never having other options available and support provided as she expressed her concern about care pt receives at [facility]. Pt sister expressed that wanted to speak with MD and CSW encouraged pt sister to visit hospital or contact RN to speak with MD regarding care.   CSW left [place] [employer] hand out in pt room for pt family as a resources.   CSW has confirmed with [facility] that pt can return to facility if pt medically ready over the weekend.   CSW to continue to follow  to provide support and assist with pt discharge planning needs.   Loletta SpecterSuzanna Kidd, MSW, LCSW Clinical Social Work [phone]

## 2016-01-27 NOTE — Plan of Care (Signed)
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

## 2016-01-27 NOTE — Progress Notes (Signed)
Pharmacy Antibiotic Note  [name] is a 50 y.o. female with hx seizures and ESBLadmitted on 01/20/2016 with sepsis.  Meropenem started on admission, changed to ceftriaxone on 3/19 for UTI.  Pharmacy is consulted to dose Vancomycin for CoNS bacteremia and Meropenem for ESBL Klebsiella UTI.  Vancomycin trough = 12 mcg/mL on 1500 mg IV q24h (goal 15-20)  Plan: - Adjust vancomycin to 750 mg IV q12h.  Plan is for patient to be discharged to a SNF.   Height: 5\' 5"  (165.1 cm) (taken 3/8 per facility) Weight: 172 lb 6.4 oz (78.2 kg) IBW/kg (Calculated) : 57  Temp (24hrs), Avg:99.1 F (37.3 C), Min:99 F (37.2 C), Max:99.2 F (37.3 C)   Recent Labs Lab 01/22/16 0717 01/22/16 1434 01/23/16 0517 01/23/16 1308 01/24/16 0518 01/25/16 0506 01/26/16 0450 01/27/16 2000  WBC 5.8  --  6.9  --  8.2 8.1 8.6  --   CREATININE 0.63 0.51 0.50  --  0.44 0.42* 0.40*  --   VANCOTROUGH  --   --   --  23*  --   --   --  12    Estimated Creatinine Clearance: 88.9 mL/min (by C-G formula based on Cr of 0.4).    Allergies  Allergen Reactions  . Amlodipine Swelling  . Latex Rash    Unknown   . Other Other (See Comments)    Natural Rubber- Unknown     Antimicrobials this admission: 3/17 Zosyn x 1 3/17 >> Vanc >> (x2 weeks, 3/30) 3/17 >> Meropenem >>3/19>> resume 3/20 >> (x5 days) 3/19 >>Rocephin>> 3/20  Dose adjustments this admission: 1/29 VT =9 about 19 hours after 1250 mg dose (pt was on 1250 mg q12h PTA), changed to 1g q8h 1/31 VT = 39 on 1g q8h, changed to 750 mg q12h 3/20 VT at 1330=  23 (on 1gm q12h) --> 2PM 1gm dose given, change to 1500 mg q24h 3/24 VT at 1900 = 12 on 1.5g q24h, change to 750 mg IV q12h  Microbiology results: 12/04/15 PICC: ESBL Klebsiella 12/04/15 Cath tip: ESBL Klebsiella and Pseudomonas (pansensitive) 01/03/16 UCx: Proteus mirabilis (R cipro, nitro) 3/17 @ 1147 BCx: 2/2 CoNS oxacillin resistant FINAL 3/17 @1524  BCx: NGTD (*Initial Vanc given at 1237 on  3/17) 3/17 UCx: 50 K Proteus Mirabilis (R- Cipro, Nitrofurantoin); >100K ESBL klebsiella (S= gent, imip, zosyn) 3/17 MRSA PCR: neg 3/17 ucx: >100K providencia stuartii (S= zosyn, CTX, imip, bactrim) and proteus mirabilis (R= cipro, nitro) 3/18 Cdiff antigen/toxin: neg 3/20 bcx x2: ngtd  Thank you for allowing pharmacy to be a part of this patient's care.  Clance BollAmanda Kacey Dysert, PharmD, BCPS Pager: [phone] 01/27/2016 9:55 PM

## 2016-01-27 NOTE — Progress Notes (Signed)
[facility] Progress Note   Meghan PoreLaura Marie Lawson  ZOX:[id number]  DOB: 01/31/1966  DOA: 01/20/2016 PCP: [name], MD  Brief narrative: Meghan PoreLaura Marie Glanzer is a 50 y.o. 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 Hospitalized on 1/28 through 2/2 for sepsis secondary to ESBL UTI Hospitalized/28 through 3/8 with Proteus mirabilis UTI, bright red blood per rectum Sent from skilled nursing facility with fever and hypotension tachycardia and a WBC count of 12.4. Subsequently developed diarrhea which is negative for C. difficile colitis. Blood cultures grew out coag-negative staph/MRSE and urine culture has grown Klebsiella, Proteus and [name] here.     Subjective: Noncommunicative  Assessment/Plan: Principal Problem:   Sepsis  (A) MRSE bacteremia- from sacral wound?- ID recommends 2 weeks of vancomycin-repeat blood culture negative (B) ESBL UTI secondary to chronic Foley versus colonization-ID recommends 5 days of meropenem-meropenem was started on 3/17 but missed 3 days 18,19 and 20 due to lack of IV- now recived a full 5 days    Continues to have fevers- d/w ID- no change in plan- -continue to follow temps- temps improving   Active Problems:    Decubitus ulcer of sacral region, stage 4  with soiling secondary to stool - Wound care instructions: Cleanse wound with NS and pat gently dry. Apply black granufoam to wound bed. Bridge dressing to hip to offload pressure. Change Mon/Wed/Fri -Having loose stools which might soil the wound--will start Imodium- Rectal pouch if needed -Wound does not appear to be infected - d/w Dr Ninetta LightsHatcher no need for further treatment other than above mentioned anibiotics  Hypokalemia - due to loose  stools-Replaced  Loose stools  - from tube feeds? Soiling her wound- have changed tube fee from jevity to osmolite today per dietician recommendations to see if loose stools resolve- follow  History of intercerebral hemorrhage during clipping of aneurysm- status post PEG -Nonverbal, nonmobile, does not follow commands -Continue PEG tube feeds  Tachycardia - significantly improved from admission (130s) to low 100s now- continues depite adequate hydration and Metorpol (for HTN)  Normocytic anemia - Baseline Hgb 8.4, 7.7 on admission, Transfused 1 U PRBC , hemoglobin stable since transfusion.  Essential hypertension -  medication initially on hold secondary to hypertension, currently blood pressure improved - cont metoprolol  Seizure disorder (HCC) - Continue Vimpat and Keppra   Antibiotics: Anti-infectives    Start     Dose/Rate Route Frequency Ordered Stop   01/24/16 2000  meropenem (MERREM) 1 g in sodium chloride 0.9 % 100 mL IVPB  Status:  Discontinued     1 g 200 mL/hr over 30 Minutes Intravenous Every 8 hours 01/24/16 1945 01/27/16 0734   01/24/16 2000  vancomycin (VANCOCIN) 1,500 mg in sodium chloride 0.9 % 500 mL IVPB     1,500 mg 250 mL/hr over 120 Minutes Intravenous Every 24 hours 01/24/16 1947     01/24/16 0600  vancomycin (VANCOCIN) 1,500 mg in sodium chloride 0.9 % 500 mL IVPB  Status:  Discontinued     1,500 mg 250 mL/hr over 120 Minutes Intravenous Every 24 hours 01/23/16 1430 01/24/16 1946   01/23/16 1300  meropenem (MERREM) 1 g in sodium chloride 0.9 % 100 mL IVPB  Status:  Discontinued     1 g 200 mL/hr over 30 Minutes Intravenous Every 8 hours  01/23/16 1254 01/24/16 1944   01/22/16 1400  cefTRIAXone (ROCEPHIN) 1 g in dextrose 5 % 50 mL IVPB  Status:  Discontinued     1 g 100 mL/hr over 30 Minutes Intravenous Every 24 hours 01/22/16 1211 01/23/16 1252   01/21/16 0200  vancomycin (VANCOCIN) IVPB 1000 mg/200 mL premix  Status:  Discontinued     1,000 mg 200  mL/hr over 60 Minutes Intravenous Every 12 hours 01/20/16 1243 01/23/16 1430   01/20/16 1800  meropenem (MERREM) 1 g in sodium chloride 0.9 % 100 mL IVPB  Status:  Discontinued     1 g 200 mL/hr over 30 Minutes Intravenous Every 8 hours 01/20/16 1243 01/22/16 1211   01/20/16 1230  piperacillin-tazobactam (ZOSYN) IVPB 3.375 g     3.375 g 100 mL/hr over 30 Minutes Intravenous  Once 01/20/16 1217 01/20/16 1255   01/20/16 1230  vancomycin (VANCOCIN) IVPB 1000 mg/200 mL premix     1,000 mg 200 mL/hr over 60 Minutes Intravenous  Once 01/20/16 1217 01/20/16 1343     Code Status:     Code Status Orders        Start     Ordered   01/20/16 1515  Full code   Continuous     01/20/16 1514    Code Status History    Date Active Date Inactive Code Status Order ID Comments User Context   01/03/2016  9:48 PM 01/11/2016  6:38 PM Full Code [id number]  Briscoe Deutscher, MD ED   12/04/2015  8:31 AM 12/09/2015  4:07 PM Full Code [id number]  Maryruth Bun Rama, MD Inpatient   10/12/2015  7:29 PM 10/26/2015  5:28 PM Full Code [id number]  Rahul Astrid Drafts, PA-C ED   08/28/2015  6:20 PM 09/06/2015  6:49 PM Full Code [id number]  Hollice Espy, MD Inpatient   06/01/2015  8:34 PM 06/06/2015  5:56 PM Full Code [id number]  Ron Parker, MD Inpatient   03/16/2015  5:22 PM 03/22/2015  7:54 PM Full Code [id number]  Haydee Monica, MD Inpatient   03/31/2014 10:31 AM 04/07/2014  7:32 PM Full Code [id number]  Coralyn Helling, MD ED     Family Communication: Disposition Plan: Return to SNF- palliative care has been discussed with family on multiple occasions- they have declined DVT prophylaxis: Heparin Consultants: ID Procedures:     Objective: Filed Weights   01/24/16 0533 01/25/16 0509 01/26/16 0402  Weight: 85.1 kg (187 lb 9.8 oz) 78.8 kg (173 lb 11.6 oz) 78.2 kg (172 lb 6.4 oz)    Intake/Output Summary (Last 24 hours) at 01/27/16 1347 Last data filed at 01/27/16 0940  Gross per 24 hour  Intake   1890 ml  Output   2325 ml   Net   -435 ml     Vitals Filed Vitals:   01/26/16 1500 01/26/16 2238 01/27/16 0700 01/27/16 0923  BP: 111/71 126/87 111/75   Pulse: 107 108 113 114  Temp: 100 F (37.8 C) 99.1 F (37.3 C) 99 F (37.2 C)   TempSrc: Oral Oral Oral   Resp: 20 20 20    Height:      Weight:      SpO2: 99% 100% 100%     Exam:  General:  Pt is alert,Does not communicate, not in acute distress  HEENT: No icterus, No thrush, oral mucosa moist  Cardiovascular: regular rate and rhythm, S1/S2 No murmur  Respiratory: clear to auscultation bilaterally   Abdomen: Soft, +Bowel sounds, non tender, non distended,  no guarding-Peg tube intact  MSK: No cyanosis or clubbing- no pedal edema-contracted extremities  Skin: Wound VAC on sacral decubitus ulcer   Data Reviewed: Basic Metabolic Panel:  Recent Labs Lab 01/20/16 1524  01/22/16 1434 01/23/16 0517 01/24/16 0518 01/25/16 0506 01/26/16 0450  NA 158*  < > 151* 147* 142 141 138  K 3.9  < > 3.4* 3.4* 3.9 3.3* 3.9  CL 125*  < > 123* 118* 113* 113* 108  CO2 23  < > 20* 19* 20* 22 22  GLUCOSE 118*  < > 133* 108* 119* 124* 116*  BUN 50*  < > 24* 20 19 19 19   CREATININE 0.93  < > 0.51 0.50 0.44 0.42* 0.40*  CALCIUM 8.8*  < > 8.6* 8.6* 8.5* 8.4* 8.5*  MG 2.8*  --   --   --   --   --   --   PHOS 4.1  --   --   --   --   --   --   < > = values in this interval not displayed. Liver Function Tests:  Recent Labs Lab 01/20/16 1524 01/21/16 0518  AST 44* 26  ALT 63* 50  ALKPHOS 151* 125  BILITOT 0.6 0.7  PROT 7.1 6.7  ALBUMIN 2.3* 2.2*   No results for input(s): LIPASE, AMYLASE in the last 168 hours. No results for input(s): AMMONIA in the last 168 hours. CBC:  Recent Labs Lab 01/20/16 1524  01/22/16 0717 01/23/16 0517 01/24/16 0518 01/25/16 0506 01/26/16 0450  WBC 12.4*  < > 5.8 6.9 8.2 8.1 8.6  NEUTROABS 8.8*  --   --   --   --   --   --   HGB 7.7*  < > 8.1* 8.1* 8.2* 8.1* 8.6*  HCT 26.3*  < > 27.2* 26.4* 26.8* 26.0* 27.4*  MCV  89.2  < > 93.2 91.3 89.6 89.7 86.4  PLT 294  < > 262 257 263 260 325  < > = values in this interval not displayed. Cardiac Enzymes: No results for input(s): CKTOTAL, CKMB, CKMBINDEX, TROPONINI in the last 168 hours. BNP (last 3 results)  Recent Labs  03/18/15 1240 08/29/15 1305  BNP 69.7 47.2    ProBNP (last 3 results) No results for input(s): PROBNP in the last 8760 hours.  CBG:  Recent Labs Lab 01/22/16 0809 01/23/16 0741 01/24/16 0803 01/26/16 0749 01/27/16 0827  GLUCAP 110* 90 106* 108* 109*    Recent Results (from the past 240 hour(s))  Blood Culture (routine x 2)     Status: None   Collection Time: 01/20/16 11:47 AM  Result Value Ref Range Status   Specimen Description BLOOD RIGHT FOREARM  Final   Special Requests BOTTLES DRAWN AEROBIC AND ANAEROBIC  Final   Culture  Setup Time   Final    GRAM POSITIVE COCCI IN CLUSTERS CRITICAL RESULT CALLED TO, READ BACK BY AND VERIFIED WITH: S DILLAN,RNA AT 1156 01/21/16 BY L BENFIELD IN BOTH AEROBIC AND ANAEROBIC BOTTLES    Culture   Final    STAPHYLOCOCCUS SPECIES (COAGULASE NEGATIVE) Performed at [facility]    Report Status 01/24/2016 FINAL  Final   Organism ID, Bacteria STAPHYLOCOCCUS SPECIES (COAGULASE NEGATIVE)  Final      Susceptibility   Staphylococcus species (coagulase negative) - MIC*    CIPROFLOXACIN >=8 RESISTANT Resistant     ERYTHROMYCIN >=8 RESISTANT Resistant     GENTAMICIN 4 SENSITIVE Sensitive     OXACILLIN >=4 RESISTANT  Resistant     TETRACYCLINE 2 SENSITIVE Sensitive     VANCOMYCIN 1 SENSITIVE Sensitive     TRIMETH/SULFA 80 RESISTANT Resistant     CLINDAMYCIN >=8 RESISTANT Resistant     RIFAMPIN >=32 RESISTANT Resistant     Inducible Clindamycin NEGATIVE Sensitive     * STAPHYLOCOCCUS SPECIES (COAGULASE NEGATIVE)  Urine culture     Status: None   Collection Time: 01/20/16 11:57 AM  Result Value Ref Range Status   Specimen Description URINE, CATHETERIZED  Final   Special Requests  NONE  Final   Culture   Final    >=100,000 COLONIES/mL KLEBSIELLA PNEUMONIAE 50,000 COLONIES/mL PROTEUS MIRABILIS ORGANISM 1 Confirmed Extended Spectrum Beta-Lactamase Producer (ESBL) Performed at [facility]    Report Status 01/23/2016 FINAL  Final   Organism ID, Bacteria PROTEUS MIRABILIS  Final   Organism ID, Bacteria KLEBSIELLA PNEUMONIAE  Final      Susceptibility   Klebsiella pneumoniae - MIC*    AMPICILLIN >=32 RESISTANT Resistant     CEFAZOLIN >=64 RESISTANT Resistant     CEFTRIAXONE >=64 RESISTANT Resistant     CIPROFLOXACIN >=4 RESISTANT Resistant     GENTAMICIN <=1 SENSITIVE Sensitive     IMIPENEM <=0.25 SENSITIVE Sensitive     NITROFURANTOIN 64 INTERMEDIATE Intermediate     TRIMETH/SULFA >=320 RESISTANT Resistant     AMPICILLIN/SULBACTAM >=32 RESISTANT Resistant     PIP/TAZO 16 SENSITIVE Sensitive     * >=100,000 COLONIES/mL KLEBSIELLA PNEUMONIAE   Proteus mirabilis - MIC*    AMPICILLIN <=2 SENSITIVE Sensitive     CEFAZOLIN <=4 SENSITIVE Sensitive     CEFTRIAXONE <=1 SENSITIVE Sensitive     CIPROFLOXACIN >=4 RESISTANT Resistant     GENTAMICIN <=1 SENSITIVE Sensitive     IMIPENEM 4 SENSITIVE Sensitive     NITROFURANTOIN 64 RESISTANT Resistant     TRIMETH/SULFA <=20 SENSITIVE Sensitive     AMPICILLIN/SULBACTAM <=2 SENSITIVE Sensitive     PIP/TAZO <=4 SENSITIVE Sensitive     * 50,000 COLONIES/mL PROTEUS MIRABILIS  Blood Culture (routine x 2)     Status: None   Collection Time: 01/20/16 12:06 PM  Result Value Ref Range Status   Specimen Description BLOOD RIGHT ANTECUBITAL  Final   Special Requests BOTTLES DRAWN AEROBIC AND ANAEROBIC  Final   Culture  Setup Time   Final    GRAM POSITIVE COCCI IN CLUSTERS AEROBIC BOTTLE CALLED TO SHEEK,S RN 01/21/16 1439 WOOTEN,K CONFIRMED BY V WILKINS    Culture   Final    STAPHYLOCOCCUS SPECIES (COAGULASE NEGATIVE) SUSCEPTIBILITIES PERFORMED ON PREVIOUS CULTURE WITHIN THE LAST 5 DAYS. Performed at [facility]    Report Status 01/24/2016 FINAL  Final  Culture, blood (x 2)     Status: None   Collection Time: 01/20/16  3:24 PM  Result Value Ref Range Status   Specimen Description BLOOD LEFT ARM  Final   Special Requests IN PEDIATRIC BOTTLE 4CC  Final   Culture   Final    NO GROWTH 5 DAYS Performed at [facility]    Report Status 01/25/2016 FINAL  Final  Culture, blood (x 2)     Status: None   Collection Time: 01/20/16  3:24 PM  Result Value Ref Range Status   Specimen Description BLOOD LEFT ARM  Final   Special Requests IN PEDIATRIC BOTTLE 4CC  Final   Culture   Final    NO GROWTH 5 DAYS Performed at [facility]    Report Status  01/25/2016 FINAL  Final  MRSA PCR Screening     Status: None   Collection Time: 01/20/16  6:56 PM  Result Value Ref Range Status   MRSA by PCR NEGATIVE NEGATIVE Final    Comment:        The GeneXpert MRSA Assay (FDA approved for NASAL specimens only), is one component of a comprehensive MRSA colonization surveillance program. It is not intended to diagnose MRSA infection nor to guide or monitor treatment for MRSA infections.   Urine culture     Status: None   Collection Time: 01/20/16  7:15 PM  Result Value Ref Range Status   Specimen Description URINE, CLEAN CATCH  Final   Special Requests NONE  Final   Culture   Final    >=100,000 COLONIES/mL PROVIDENCIA STUARTII >=100,000 COLONIES/mL PROTEUS MIRABILIS Performed at [facility]    Report Status 01/24/2016 FINAL  Final   Organism ID, Bacteria PROVIDENCIA STUARTII  Final   Organism ID, Bacteria PROTEUS MIRABILIS  Final      Susceptibility   Proteus mirabilis - MIC*    AMPICILLIN <=2 SENSITIVE Sensitive     CEFAZOLIN <=4 SENSITIVE Sensitive     CEFTRIAXONE <=1 SENSITIVE Sensitive     CIPROFLOXACIN >=4 RESISTANT Resistant     GENTAMICIN <=1 SENSITIVE Sensitive     IMIPENEM 4 SENSITIVE Sensitive     NITROFURANTOIN 128 RESISTANT Resistant     TRIMETH/SULFA <=20  SENSITIVE Sensitive     AMPICILLIN/SULBACTAM <=2 SENSITIVE Sensitive     PIP/TAZO <=4 SENSITIVE Sensitive     * >=100,000 COLONIES/mL PROTEUS MIRABILIS   Providencia stuartii - MIC*    AMPICILLIN >=32 RESISTANT Resistant     CEFAZOLIN >=64 RESISTANT Resistant     CEFTRIAXONE <=1 SENSITIVE Sensitive     CIPROFLOXACIN >=4 RESISTANT Resistant     GENTAMICIN 4 RESISTANT Resistant     IMIPENEM 0.5 SENSITIVE Sensitive     NITROFURANTOIN 256 RESISTANT Resistant     TRIMETH/SULFA <=20 SENSITIVE Sensitive     AMPICILLIN/SULBACTAM 16 INTERMEDIATE Intermediate     PIP/TAZO <=4 SENSITIVE Sensitive     * >=100,000 COLONIES/mL PROVIDENCIA STUARTII  C difficile quick scan w PCR reflex     Status: None   Collection Time: 01/21/16  9:01 AM  Result Value Ref Range Status   C Diff antigen NEGATIVE NEGATIVE Final   C Diff toxin NEGATIVE NEGATIVE Final   C Diff interpretation Negative for toxigenic C. difficile  Final  Culture, blood (routine x 2)     Status: None (Preliminary result)   Collection Time: 01/23/16  8:20 PM  Result Value Ref Range Status   Specimen Description BLOOD LEFT ARM  Final   Special Requests BOTTLES DRAWN AEROBIC AND ANAEROBIC 5 CC  Final   Culture   Final    NO GROWTH 3 DAYS Performed at [facility]    Report Status PENDING  Incomplete  Culture, blood (routine x 2)     Status: None (Preliminary result)   Collection Time: 01/23/16  8:23 PM  Result Value Ref Range Status   Specimen Description BLOOD LEFT HAND  Final   Special Requests BOTTLES DRAWN AEROBIC AND ANAEROBIC 5 CC  Final   Culture   Final    NO GROWTH 3 DAYS Performed at [facility]    Report Status PENDING  Incomplete     Studies: No results found.  Scheduled Meds:  Scheduled Meds: . sodium chloride   Intravenous Once  . acetaminophen  650 mg Per Tube Daily  . antiseptic oral rinse  7 mL Mouth Rinse q12n4p  . chlorhexidine  15 mL Mouth Rinse BID  . cholecalciferol  5,000 Units Oral Q  breakfast  . cholestyramine light  4 g Oral TID  . feeding supplement (PRO-STAT SUGAR FREE 64)  60 mL Per Tube BID  . ferrous sulfate  300 mg Per Tube BID WC  . free water  200 mL Oral 3 times per day  . heparin subcutaneous  5,000 Units Subcutaneous 3 times per day  . lacosamide  100 mg Per Tube BID  . levETIRAcetam  1,500 mg Per Tube BID  . loperamide  2 mg Oral BID  . metoprolol  50 mg Oral BID  . multivitamin with minerals  1 tablet Per Tube Q breakfast  . polyvinyl alcohol  2 drop Both Eyes TID  . ranitidine  150 mg Per Tube BID  . sodium chloride flush  3 mL Intravenous Q12H  . vancomycin  1,500 mg Intravenous Q24H  . vitamin C  1,000 mg Oral Daily   Continuous Infusions: . feeding supplement (OSMOLITE 1.5 CAL)      Time spent on care of this patient: 40 min   Jillyan Plitt, MD 01/27/2016, 1:47 PM  LOS: 7 days   [facility] Office  [phone] Pager - Text Page per [email or web address] If 7PM-7AM, please contact night-coverage [email or web address]

## 2016-01-28 DIAGNOSIS — D72829 Elevated white blood cell count, unspecified: Secondary | ICD-10-CM

## 2016-01-28 DIAGNOSIS — R000 Tachycardia, unspecified: Secondary | ICD-10-CM

## 2016-01-28 DIAGNOSIS — G8250 Quadriplegia, unspecified: Secondary | ICD-10-CM

## 2016-01-28 LAB — BASIC METABOLIC PANEL
ANION GAP: 9 (ref 5–15)
BUN: 17 mg/dL (ref 6–20)
CALCIUM: 8.4 mg/dL — AB (ref 8.9–10.3)
CO2: 23 mmol/L (ref 22–32)
CREATININE: 0.36 mg/dL — AB (ref 0.44–1.00)
Chloride: 102 mmol/L (ref 101–111)
Glucose, Bld: 106 mg/dL — ABNORMAL HIGH (ref 65–99)
Potassium: 3.8 mmol/L (ref 3.5–5.1)
Sodium: 134 mmol/L — ABNORMAL LOW (ref 135–145)

## 2016-01-28 LAB — CBC
HCT: 26.6 % — ABNORMAL LOW (ref 36.0–46.0)
HEMOGLOBIN: 8.5 g/dL — AB (ref 12.0–15.0)
MCH: 27.7 pg (ref 26.0–34.0)
MCHC: 32 g/dL (ref 30.0–36.0)
MCV: 86.6 fL (ref 78.0–100.0)
PLATELETS: 347 10*3/uL (ref 150–400)
RBC: 3.07 MIL/uL — AB (ref 3.87–5.11)
RDW: 18 % — ABNORMAL HIGH (ref 11.5–15.5)
WBC: 8.6 10*3/uL (ref 4.0–10.5)

## 2016-01-28 LAB — GLUCOSE, CAPILLARY: GLUCOSE-CAPILLARY: 105 mg/dL — AB (ref 65–99)

## 2016-01-28 MED ORDER — LOPERAMIDE HCL 2 MG PO CAPS: 2.0000 mg | ORAL_CAPSULE | Freq: Two times a day (BID) | ORAL | Status: DC

## 2016-01-28 MED ORDER — VANCOMYCIN HCL IN DEXTROSE 750-5 MG/150ML-% IV SOLN: 750.0000 mg | Freq: Two times a day (BID) | INTRAVENOUS | Status: DC

## 2016-01-28 MED ORDER — OSMOLITE 1.5 CAL PO LIQD: 1000.0000 mL | ORAL | Status: DC

## 2016-01-28 NOTE — Clinical Social Work Note (Addendum)
CSW spoke with patient's son who requested to speak with the physician, CSW informed physician.  CSW to continue to follow patient's progress, [facility] today.  4:15pm CSW was informed by the physician that patient's family would like to have CSW look for hospice facility placement.  CSW spoke to patient's son Sander Radonorrie Jiles [phone] and [facility] would [facility].  CSW contacted [facility] to make referral.  CSW spoke to Chauncy Passyebbie Geyer [phone] and she requested patient's clinical information be faxed to facility.  CSW faxed requested documents, facility will review and determine if patient meets eligibility criteria and call back CSW.  CSW informed physician that referral has been made.    Ervin KnackEric R. Conya Ellinwood, MSW, [employer]WA [phone]

## 2016-01-28 NOTE — Progress Notes (Addendum)
[facility] Progress Note   Donald PoreLaura Marie Pedro  ZOX:[id number]  DOB: 03/30/1966  DOA: 01/20/2016 PCP: [name], MD  Brief narrative: Donald PoreLaura Marie Dimalanta is a 50 y.o. 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 Hospitalized on 1/28 through 2/2 for sepsis secondary to ESBL UTI Hospitalized/28 through 3/8 with Proteus mirabilis UTI, bright red blood per rectum Sent from skilled nursing facility with fever and hypotension tachycardia and a WBC count of 12.4. Subsequently developed diarrhea which is negative for C. difficile colitis. Blood cultures grew out coag-negative staph/MRSE and urine culture has grown Klebsiella, Proteus and [name] here.     Subjective: Noncommunicative  Assessment/Plan: Principal Problem: Disposition:  - have discussed poor quality of life with son today- he will be talking to his family to make a decision on comfort care today ADDENDUM- son has decided on comfort care but would like tube feeds to continue. - DNR discussed Will look for hospice bed- he would like the facility in [place]. Life expectancy < 30 days if infection is not treated.      Sepsis  (A) MRSE bacteremia- from sacral wound?- ID recommends 2 weeks of vancomycin-repeat blood culture negative (B) ESBL UTI secondary to chronic Foley versus colonization-ID recommends 5 days of meropenem-meropenem was started on 3/17 but missed 3 days 18,19 and 20 due to lack of IV- now recived a full 5 days   Stopping Vancomycin today  Active Problems:    Decubitus ulcer of sacral region, stage 4  with soiling secondary to stool - Wound care instructions: Cleanse wound with NS and pat gently dry. Apply black granufoam to wound bed. Bridge dressing to hip to  offload pressure. Change Mon/Wed/Fri -Having loose stools which might soil the wound- Rectal pouch if needed- see below -Wound does not appear to be infected   Loose stools  - from tube feeds? Soiling her wound- have changed tube fee from jevity to osmolite today per dietician recommendations to see if loose stools resolve- stool appear to be more formed per RN  Hypokalemia - due to loose stools-Replaced  History of intercerebral hemorrhage during clipping of aneurysm- status post PEG -Nonverbal, nonmobile, does not follow commands -Continue PEG tube feeds  Tachycardia - significantly improved from admission (130s) to low 100s now- continues depite adequate hydration and Metoprolol (for HTN)  Normocytic anemia - Baseline Hgb 8.4, 7.7 on admission, Transfused 1 U PRBC , hemoglobin stable since transfusion.  Essential hypertension -  medication initially on hold secondary to hypertension, currently blood pressure improved - cont metoprolol  Seizure disorder (HCC) - Continue Vimpat and Keppra   Antibiotics: Anti-infectives    Start     Dose/Rate Route Frequency Ordered Stop   01/28/16 0000  Vancomycin (VANCOCIN) 750 MG/150ML SOLN    Comments:  Dose to be adjusted by pharmacy based on Troughs   750 mg 150 mL/hr over 60 Minutes Intravenous Every 12 hours 01/28/16 1015     01/27/16 2200  vancomycin (VANCOCIN) IVPB 750 mg/150 ml premix     750 mg 150 mL/hr over 60 Minutes Intravenous Every 12 hours 01/27/16 2156     01/24/16 2000  meropenem (MERREM) 1 g in sodium chloride 0.9 % 100 mL IVPB  Status:  Discontinued     1 g 200 mL/hr over 30 Minutes  Intravenous Every 8 hours 01/24/16 1945 01/27/16 0734   01/24/16 2000  vancomycin (VANCOCIN) 1,500 mg in sodium chloride 0.9 % 500 mL IVPB  Status:  Discontinued     1,500 mg 250 mL/hr over 120 Minutes Intravenous Every 24 hours 01/24/16 1947 01/27/16 2156   01/24/16 0600  vancomycin (VANCOCIN) 1,500 mg in sodium chloride 0.9 % 500 mL  IVPB  Status:  Discontinued     1,500 mg 250 mL/hr over 120 Minutes Intravenous Every 24 hours 01/23/16 1430 01/24/16 1946   01/23/16 1300  meropenem (MERREM) 1 g in sodium chloride 0.9 % 100 mL IVPB  Status:  Discontinued     1 g 200 mL/hr over 30 Minutes Intravenous Every 8 hours 01/23/16 1254 01/24/16 1944   01/22/16 1400  cefTRIAXone (ROCEPHIN) 1 g in dextrose 5 % 50 mL IVPB  Status:  Discontinued     1 g 100 mL/hr over 30 Minutes Intravenous Every 24 hours 01/22/16 1211 01/23/16 1252   01/21/16 0200  vancomycin (VANCOCIN) IVPB 1000 mg/200 mL premix  Status:  Discontinued     1,000 mg 200 mL/hr over 60 Minutes Intravenous Every 12 hours 01/20/16 1243 01/23/16 1430   01/20/16 1800  meropenem (MERREM) 1 g in sodium chloride 0.9 % 100 mL IVPB  Status:  Discontinued     1 g 200 mL/hr over 30 Minutes Intravenous Every 8 hours 01/20/16 1243 01/22/16 1211   01/20/16 1230  piperacillin-tazobactam (ZOSYN) IVPB 3.375 g     3.375 g 100 mL/hr over 30 Minutes Intravenous  Once 01/20/16 1217 01/20/16 1255   01/20/16 1230  vancomycin (VANCOCIN) IVPB 1000 mg/200 mL premix     1,000 mg 200 mL/hr over 60 Minutes Intravenous  Once 01/20/16 1217 01/20/16 1343     Code Status:     Code Status Orders        Start     Ordered   01/20/16 1515  Full code   Continuous     01/20/16 1514    Code Status History    Date Active Date Inactive Code Status Order ID Comments User Context   01/03/2016  9:48 PM 01/11/2016  6:38 PM Full Code [id number]  Briscoe Deutscher, MD ED   12/04/2015  8:31 AM 12/09/2015  4:07 PM Full Code [id number]  Maryruth Bun Rama, MD Inpatient   10/12/2015  7:29 PM 10/26/2015  5:28 PM Full Code [id number]  Rahul Astrid Drafts, PA-C ED   08/28/2015  6:20 PM 09/06/2015  6:49 PM Full Code [id number]  Hollice Espy, MD Inpatient   06/01/2015  8:34 PM 06/06/2015  5:56 PM Full Code [id number]  Ron Parker, MD Inpatient   03/16/2015  5:22 PM 03/22/2015  7:54 PM Full Code [id number]  Haydee Monica, MD  Inpatient   03/31/2014 10:31 AM 04/07/2014  7:32 PM Full Code [id number]  Coralyn Helling, MD ED     Family Communication: son Torrie [phone] Disposition Plan: hospice DVT prophylaxis: Heparin Consultants: ID Procedures:     Objective: Filed Weights   01/24/16 0533 01/25/16 0509 01/26/16 0402  Weight: 85.1 kg (187 lb 9.8 oz) 78.8 kg (173 lb 11.6 oz) 78.2 kg (172 lb 6.4 oz)    Intake/Output Summary (Last 24 hours) at 01/28/16 1101 Last data filed at 01/28/16 0700  Gross per 24 hour  Intake   1090 ml  Output   2250 ml  Net  -1160 ml     Vitals Filed Vitals:   01/27/16 1530 01/27/16 2315  01/28/16 0637 01/28/16 0808  BP: 112/78 116/82 112/65 112/61  Pulse: 109 99 100 104  Temp: 99.2 F (37.3 C) 98.5 F (36.9 C) 98.2 F (36.8 C) 98.5 F (36.9 C)  TempSrc: Oral Oral Oral Oral  Resp: 22 20 20 25   Height:      Weight:      SpO2: 98% 100% 99% 100%    Exam:  General:  Pt is alert,Does not communicate, not in acute distress  HEENT: No icterus, No thrush, oral mucosa moist  Cardiovascular: regular rate and rhythm, S1/S2 No murmur  Respiratory: clear to auscultation bilaterally   Abdomen: Soft, +Bowel sounds, non tender, non distended, no guarding-Peg tube intact  MSK: No cyanosis or clubbing- no pedal edema-contracted extremities  Skin: Wound VAC on sacral decubitus ulcer   Data Reviewed: Basic Metabolic Panel:  Recent Labs Lab 01/23/16 0517 01/24/16 0518 01/25/16 0506 01/26/16 0450 01/28/16 0500  NA 147* 142 141 138 134*  K 3.4* 3.9 3.3* 3.9 3.8  CL 118* 113* 113* 108 102  CO2 19* 20* 22 22 23   GLUCOSE 108* 119* 124* 116* 106*  BUN 20 19 19 19 17   CREATININE 0.50 0.44 0.42* 0.40* 0.36*  CALCIUM 8.6* 8.5* 8.4* 8.5* 8.4*   Liver Function Tests: No results for input(s): AST, ALT, ALKPHOS, BILITOT, PROT, ALBUMIN in the last 168 hours. No results for input(s): LIPASE, AMYLASE in the last 168 hours. No results for input(s): AMMONIA in the last 168  hours. CBC:  Recent Labs Lab 01/23/16 0517 01/24/16 0518 01/25/16 0506 01/26/16 0450 01/28/16 0500  WBC 6.9 8.2 8.1 8.6 8.6  HGB 8.1* 8.2* 8.1* 8.6* 8.5*  HCT 26.4* 26.8* 26.0* 27.4* 26.6*  MCV 91.3 89.6 89.7 86.4 86.6  PLT 257 263 260 325 347   Cardiac Enzymes: No results for input(s): CKTOTAL, CKMB, CKMBINDEX, TROPONINI in the last 168 hours. BNP (last 3 results)  Recent Labs  03/18/15 1240 08/29/15 1305  BNP 69.7 47.2    ProBNP (last 3 results) No results for input(s): PROBNP in the last 8760 hours.  CBG:  Recent Labs Lab 01/23/16 0741 01/24/16 0803 01/26/16 0749 01/27/16 0827 01/28/16 0802  GLUCAP 90 106* 108* 109* 105*    Recent Results (from the past 240 hour(s))  Blood Culture (routine x 2)     Status: None   Collection Time: 01/20/16 11:47 AM  Result Value Ref Range Status   Specimen Description BLOOD RIGHT FOREARM  Final   Special Requests BOTTLES DRAWN AEROBIC AND ANAEROBIC  Final   Culture  Setup Time   Final    GRAM POSITIVE COCCI IN CLUSTERS CRITICAL RESULT CALLED TO, READ BACK BY AND VERIFIED WITH: Tora Kindred AT 1156 01/21/16 BY L BENFIELD IN BOTH AEROBIC AND ANAEROBIC BOTTLES    Culture   Final    STAPHYLOCOCCUS SPECIES (COAGULASE NEGATIVE) Performed at [facility]    Report Status 01/24/2016 FINAL  Final   Organism ID, Bacteria STAPHYLOCOCCUS SPECIES (COAGULASE NEGATIVE)  Final      Susceptibility   Staphylococcus species (coagulase negative) - MIC*    CIPROFLOXACIN >=8 RESISTANT Resistant     ERYTHROMYCIN >=8 RESISTANT Resistant     GENTAMICIN 4 SENSITIVE Sensitive     OXACILLIN >=4 RESISTANT Resistant     TETRACYCLINE 2 SENSITIVE Sensitive     VANCOMYCIN 1 SENSITIVE Sensitive     TRIMETH/SULFA 80 RESISTANT Resistant     CLINDAMYCIN >=8 RESISTANT Resistant     RIFAMPIN >=32 RESISTANT Resistant  Inducible Clindamycin NEGATIVE Sensitive     * STAPHYLOCOCCUS SPECIES (COAGULASE NEGATIVE)  Urine culture     Status:  None   Collection Time: 01/20/16 11:57 AM  Result Value Ref Range Status   Specimen Description URINE, CATHETERIZED  Final   Special Requests NONE  Final   Culture   Final    >=100,000 COLONIES/mL KLEBSIELLA PNEUMONIAE 50,000 COLONIES/mL PROTEUS MIRABILIS ORGANISM 1 Confirmed Extended Spectrum Beta-Lactamase Producer (ESBL) Performed at [facility]    Report Status 01/23/2016 FINAL  Final   Organism ID, Bacteria PROTEUS MIRABILIS  Final   Organism ID, Bacteria KLEBSIELLA PNEUMONIAE  Final      Susceptibility   Klebsiella pneumoniae - MIC*    AMPICILLIN >=32 RESISTANT Resistant     CEFAZOLIN >=64 RESISTANT Resistant     CEFTRIAXONE >=64 RESISTANT Resistant     CIPROFLOXACIN >=4 RESISTANT Resistant     GENTAMICIN <=1 SENSITIVE Sensitive     IMIPENEM <=0.25 SENSITIVE Sensitive     NITROFURANTOIN 64 INTERMEDIATE Intermediate     TRIMETH/SULFA >=320 RESISTANT Resistant     AMPICILLIN/SULBACTAM >=32 RESISTANT Resistant     PIP/TAZO 16 SENSITIVE Sensitive     * >=100,000 COLONIES/mL KLEBSIELLA PNEUMONIAE   Proteus mirabilis - MIC*    AMPICILLIN <=2 SENSITIVE Sensitive     CEFAZOLIN <=4 SENSITIVE Sensitive     CEFTRIAXONE <=1 SENSITIVE Sensitive     CIPROFLOXACIN >=4 RESISTANT Resistant     GENTAMICIN <=1 SENSITIVE Sensitive     IMIPENEM 4 SENSITIVE Sensitive     NITROFURANTOIN 64 RESISTANT Resistant     TRIMETH/SULFA <=20 SENSITIVE Sensitive     AMPICILLIN/SULBACTAM <=2 SENSITIVE Sensitive     PIP/TAZO <=4 SENSITIVE Sensitive     * 50,000 COLONIES/mL PROTEUS MIRABILIS  Blood Culture (routine x 2)     Status: None   Collection Time: 01/20/16 12:06 PM  Result Value Ref Range Status   Specimen Description BLOOD RIGHT ANTECUBITAL  Final   Special Requests BOTTLES DRAWN AEROBIC AND ANAEROBIC  Final   Culture  Setup Time   Final    GRAM POSITIVE COCCI IN CLUSTERS AEROBIC BOTTLE CALLED TO SHEEK,S RN 01/21/16 1439 WOOTEN,K CONFIRMED BY V WILKINS    Culture   Final     STAPHYLOCOCCUS SPECIES (COAGULASE NEGATIVE) SUSCEPTIBILITIES PERFORMED ON PREVIOUS CULTURE WITHIN THE LAST 5 DAYS. Performed at [facility]    Report Status 01/24/2016 FINAL  Final  Culture, blood (x 2)     Status: None   Collection Time: 01/20/16  3:24 PM  Result Value Ref Range Status   Specimen Description BLOOD LEFT ARM  Final   Special Requests IN PEDIATRIC BOTTLE 4CC  Final   Culture   Final    NO GROWTH 5 DAYS Performed at [facility]    Report Status 01/25/2016 FINAL  Final  Culture, blood (x 2)     Status: None   Collection Time: 01/20/16  3:24 PM  Result Value Ref Range Status   Specimen Description BLOOD LEFT ARM  Final   Special Requests IN PEDIATRIC BOTTLE 4CC  Final   Culture   Final    NO GROWTH 5 DAYS Performed at [facility]    Report Status 01/25/2016 FINAL  Final  MRSA PCR Screening     Status: None   Collection Time: 01/20/16  6:56 PM  Result Value Ref Range Status   MRSA by PCR NEGATIVE NEGATIVE Final    Comment:  The GeneXpert MRSA Assay (FDA approved for NASAL specimens only), is one component of a comprehensive MRSA colonization surveillance program. It is not intended to diagnose MRSA infection nor to guide or monitor treatment for MRSA infections.   Urine culture     Status: None   Collection Time: 01/20/16  7:15 PM  Result Value Ref Range Status   Specimen Description URINE, CLEAN CATCH  Final   Special Requests NONE  Final   Culture   Final    >=100,000 COLONIES/mL PROVIDENCIA STUARTII >=100,000 COLONIES/mL PROTEUS MIRABILIS Performed at [facility]    Report Status 01/24/2016 FINAL  Final   Organism ID, Bacteria PROVIDENCIA STUARTII  Final   Organism ID, Bacteria PROTEUS MIRABILIS  Final      Susceptibility   Proteus mirabilis - MIC*    AMPICILLIN <=2 SENSITIVE Sensitive     CEFAZOLIN <=4 SENSITIVE Sensitive     CEFTRIAXONE <=1 SENSITIVE Sensitive     CIPROFLOXACIN >=4 RESISTANT Resistant      GENTAMICIN <=1 SENSITIVE Sensitive     IMIPENEM 4 SENSITIVE Sensitive     NITROFURANTOIN 128 RESISTANT Resistant     TRIMETH/SULFA <=20 SENSITIVE Sensitive     AMPICILLIN/SULBACTAM <=2 SENSITIVE Sensitive     PIP/TAZO <=4 SENSITIVE Sensitive     * >=100,000 COLONIES/mL PROTEUS MIRABILIS   Providencia stuartii - MIC*    AMPICILLIN >=32 RESISTANT Resistant     CEFAZOLIN >=64 RESISTANT Resistant     CEFTRIAXONE <=1 SENSITIVE Sensitive     CIPROFLOXACIN >=4 RESISTANT Resistant     GENTAMICIN 4 RESISTANT Resistant     IMIPENEM 0.5 SENSITIVE Sensitive     NITROFURANTOIN 256 RESISTANT Resistant     TRIMETH/SULFA <=20 SENSITIVE Sensitive     AMPICILLIN/SULBACTAM 16 INTERMEDIATE Intermediate     PIP/TAZO <=4 SENSITIVE Sensitive     * >=100,000 COLONIES/mL PROVIDENCIA STUARTII  C difficile quick scan w PCR reflex     Status: None   Collection Time: 01/21/16  9:01 AM  Result Value Ref Range Status   C Diff antigen NEGATIVE NEGATIVE Final   C Diff toxin NEGATIVE NEGATIVE Final   C Diff interpretation Negative for toxigenic C. difficile  Final  Culture, blood (routine x 2)     Status: None (Preliminary result)   Collection Time: 01/23/16  8:20 PM  Result Value Ref Range Status   Specimen Description BLOOD LEFT ARM  Final   Special Requests BOTTLES DRAWN AEROBIC AND ANAEROBIC 5 CC  Final   Culture   Final    NO GROWTH 4 DAYS Performed at [facility]    Report Status PENDING  Incomplete  Culture, blood (routine x 2)     Status: None (Preliminary result)   Collection Time: 01/23/16  8:23 PM  Result Value Ref Range Status   Specimen Description BLOOD LEFT HAND  Final   Special Requests BOTTLES DRAWN AEROBIC AND ANAEROBIC 5 CC  Final   Culture   Final    NO GROWTH 4 DAYS Performed at [facility]    Report Status PENDING  Incomplete     Studies: No results found.  Scheduled Meds:  Scheduled Meds: . sodium chloride   Intravenous Once  . acetaminophen  650 mg Per Tube  Daily  . antiseptic oral rinse  7 mL Mouth Rinse q12n4p  . chlorhexidine  15 mL Mouth Rinse BID  . cholecalciferol  5,000 Units Oral Q breakfast  . cholestyramine light  4 g Oral TID  .  feeding supplement (PRO-STAT SUGAR FREE 64)  60 mL Per Tube BID  . ferrous sulfate  300 mg Per Tube BID WC  . free water  200 mL Oral 3 times per day  . heparin subcutaneous  5,000 Units Subcutaneous 3 times per day  . lacosamide  100 mg Per Tube BID  . levETIRAcetam  1,500 mg Per Tube BID  . loperamide  2 mg Oral BID  . metoprolol  50 mg Oral BID  . multivitamin with minerals  1 tablet Per Tube Q breakfast  . polyvinyl alcohol  2 drop Both Eyes TID  . ranitidine  150 mg Per Tube BID  . sodium chloride flush  3 mL Intravenous Q12H  . vancomycin  750 mg Intravenous Q12H  . vitamin C  1,000 mg Oral Daily   Continuous Infusions: . feeding supplement (OSMOLITE 1.5 CAL) 1,000 mL (01/27/16 1546)    Time spent on care of this patient: 40 min   Sussie Minor, MD 01/28/2016, 11:01 AM  LOS: 8 days   [facility] Office  [phone] Pager - Text Page per [email or web address] If 7PM-7AM, please contact night-coverage [email or web address]

## 2016-01-28 NOTE — Discharge Summary (Addendum)
Physician Discharge Summary  Donald PoreLaura Marie Sanz ZOX:[id number] DOB: 10/14/1966 DOA: 01/20/2016  PCP: [name], MD  Admit date: 01/20/2016 Discharge date: 01/31/2016  Time spent: 60 minutes  Recommendations for Outpatient Follow-up:  Will be transferred to residential hospice in [place] Control diarrhea and prevent seizures to allow her to be comfortably  Discharge Condition: stable   Discharge Diagnoses:  Principal Problem:   Sepsis (HCC) Active Problems:   Bacteremia   Decubitus ulcer of sacral region, stage 4 (HCC)   Normocytic anemia   S/P percutaneous endoscopic gastrostomy (PEG) tube placement (HCC)   UTI (urinary tract infection) due to urinary indwelling Foley catheter (HCC)   Leukocytosis   FTT (failure to thrive) in adult   Essential hypertension   Seizure disorder (HCC)   Hypernatremia   Tachycardia,- chronic   DNR (do not resuscitate)   Palliative care encounter  Chronic tachycardia  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 Hospitalized on 1/28 through 2/2 for sepsis secondary to ESBL UTI Hospitalized/28 through 3/8 with Proteus mirabilis UTI, bright red blood per rectum Sent from skilled nursing facility with fever, hypotension tachycardia and a WBC count of 12.4. Subsequently developed diarrhea which is negative for C. difficile colitis. Blood cultures grew out coag-negative staph/MRSE and urine culture has grown Klebsiella, Proteus and [name] here.   Hospital Course:  Principal Problem:  Sepsis  (A) staph bacteremia- from sacral wound?- ID recommends 2 weeks of vancomycin-repeat blood culture  negative (B) ESBL UTI secondary to chronic Foley -ID recommends 5 days of meropenem-meropenem was started on 3/17 but missed 3 days 18,19 and 20 due to lack of IV-  recived a full 5 days - fevers resolved- high risk of recurrence due to chronic foley    - have spoken with son Torrie who was asking me about her mother's condition. I expalained in detail about poor prognosis and poor quality of life and have suggested hospice. He understands and agrees. He states he has spoken with all of his family members and would like for him mother to be transitioned to comfort care. She will be transitioned to residential hospice. She is quite comfortable with out any narcotics or sedatives.   Active Problems:   Decubitus ulcer of sacral region, stage 4 with soiling secondary to stool -has had wound vac  -Having loose stools which were soiling the wound--started Imodium- used Rectal pouch   - as we have stopped Tube feeds, loose stools may resolve   Loose stools  - from tube feeds? Soiling her wound- have changed tube fee from jevity to osmolite today per dietician recommendations to see if loose stools resolve- stool appear to be more formed per RN- stopping Tube feeds now   Hypokalemia - due to loose stools-Replaced adequately  History of intercerebral hemorrhage during clipping of aneurysm- status post PEG -Nonverbal, nonmobile, does not follow commands -Continue PEG tube feeds and foley cath  Tachycardia - significantly improved from admission (130s) to low 100s now- continues depite adequate hydration and Metoprolol (for HTN)- due to hospice status, have stopped Metoprolol  Normocytic anemia - Baseline Hgb 8.4, 7.7 on admission, Transfused 1 U PRBC , hemoglobin stable since transfusion.  Essential  hypertension - stopping Metoprolol as she is hospice  Seizure disorder (HCC) - Continue Vimpat and Keppra to prevent seizure (comfort)    Consultations:  ID  Discharge Exam: Filed  Weights   01/25/16 0509 01/26/16 0402 01/30/16 0500  Weight: 78.8 kg (173 lb 11.6 oz) 78.2 kg (172 lb 6.4 oz) 78.8 kg (173 lb 11.6 oz)   Filed Vitals:   01/30/16 2201 01/31/16 0628  BP: 113/78 92/60  Pulse: 104 95  Temp: 99.9 F (37.7 C) 98.8 F (37.1 C)  Resp: 16 22    General: alert, no distress Cardiovascular: RRR, no murmurs  Respiratory: clear to auscultation bilaterally GI: soft, non-tender, non-distended, bowel sound positive- PEG intact Skin: wound vac to sacral ulcer    Discharge Instructions You were cared for by a hospitalist during your hospital stay. If you have any questions about your discharge medications or the care you received while you were in the hospital after you are discharged, you can call the unit and asked to speak with the hospitalist on call if the hospitalist that took care of you is not available. Once you are discharged, your primary care physician will handle any further medical issues. Please note that NO REFILLS for any discharge medications will be authorized once you are discharged, as it is imperative that you return to your primary care physician (or establish a relationship with a primary care physician if you do not have one) for your aftercare needs so that they can reassess your need for medications and monitor your lab values.      Discharge Instructions    Increase activity slowly    Complete by:  As directed             Medication List    STOP taking these medications        bisacodyl 10 MG suppository  Commonly known as:  DULCOLAX     cholestyramine light 4 g packet  Commonly known as:  PREVALITE     famotidine 40 MG/5ML suspension  Commonly known as:  PEPCID     feeding supplement (JEVITY 1.5 CAL/FIBER) Liqd     feeding supplement (PRO-STAT SUGAR FREE 64) Liqd     ferrous sulfate 300 (60 Fe) MG/5ML syrup     free water Soln     lisinopril 10 MG tablet  Commonly known as:  PRINIVIL,ZESTRIL     loperamide 2 MG  tablet  Commonly known as:  IMODIUM A-D     metoprolol 50 MG tablet  Commonly known as:  LOPRESSOR     Metoprolol Tartrate 75 MG Tabs     multivitamin with minerals Tabs tablet     polyethylene glycol packet  Commonly known as:  MIRALAX / GLYCOLAX     Potassium Chloride 25 MEQ Pack     ranitidine 150 MG/10ML syrup  Commonly known as:  ZANTAC     vitamin C 1000 MG tablet     Vitamin D3 5000 units Tabs      TAKE these medications        acetaminophen 325 MG tablet  Commonly known as:  TYLENOL  Place 2 tablets (650 mg total) into feeding tube every 6 (six) hours as needed for mild pain (or Fever >/= 101).     antiseptic oral rinse 0.05 % Liqd solution  Commonly known as:  CPC / CETYLPYRIDINIUM CHLORIDE 0.05%  7 mLs by Mouth Rinse route 2 (two) times daily.     carboxymethylcellulose 0.5 % Soln  Commonly  known as:  REFRESH PLUS  Place 2 drops into both eyes 3 (three) times daily.     chlorhexidine 0.12 % solution  Commonly known as:  PERIDEX  15 mLs by Mouth Rinse route 2 (two) times daily.     Lacosamide 100 MG Tabs  Place 1 tablet (100 mg total) into feeding tube 2 (two) times daily.     levETIRAcetam 100 MG/ML solution  Commonly known as:  KEPPRA  Place 15 mLs (1,500 mg total) into feeding tube 2 (two) times daily.     ondansetron 4 MG tablet  Commonly known as:  ZOFRAN  Place 1 tablet (4 mg total) into feeding tube every 6 (six) hours as needed for nausea.     traZODone 50 MG tablet  Commonly known as:  DESYREL  Place 0.5 tablets (25 mg total) into feeding tube at bedtime as needed for sleep.       Allergies  Allergen Reactions  . Amlodipine Swelling  . Latex Rash    Unknown   . Other Other (See Comments)    Natural Rubber- Unknown       The results of significant diagnostics from this hospitalization (including imaging, microbiology, ancillary and laboratory) are listed below for reference.    Significant Diagnostic Studies: Ct Angio Chest Pe  W/cm &/or Wo Cm  01/08/2016  CLINICAL DATA:  50 year old female with stroke. Concern for pulmonary embolism. EXAM: CT ANGIOGRAPHY CHEST WITH CONTRAST TECHNIQUE: Multidetector CT imaging of the chest was performed using the standard protocol during bolus administration of intravenous contrast. Multiplanar CT image reconstructions and MIPs were obtained to evaluate the vascular anatomy. CONTRAST:  80mL OMNIPAQUE IOHEXOL 350 MG/ML SOLN COMPARISON:  Chest radiograph dated 01/03/2016 FINDINGS: The lungs are clear. There is no pleural effusion or pneumothorax. The central airways are patent. The thoracic aorta appears unremarkable. Evaluation of the pulmonary arteries is limited due to suboptimal opacification of the distal branches as well as streak artifact caused by patient's arms. No definite central pulmonary artery embolus identified. Top-normal cardiac size. No pericardial effusion. There is no hilar or mediastinal adenopathy. The esophagus is grossly unremarkable. No thyroid nodules identified. There is no axillary adenopathy. The chest wall soft tissues appear unremarkable. There is mild degenerative changes of the spine. No acute fracture. The visualized upper abdomen appears unremarkable. Review of the MIP images confirms the above findings. IMPRESSION: No acute intrathoracic pathology. No CT evidence of central pulmonary artery embolus. Electronically Signed   By: Elgie Collard M.D.   On: 01/08/2016 00:55   Ir Fluoro Guide Cv Line Left  01/25/2016  INDICATION: Bacteremia; request made for central venous access for antibiotic therapy. EXAM: LEFT UPPER EXTREMITY PICC LINE PLACEMENT WITH ULTRASOUND AND FLUOROSCOPIC GUIDANCE MEDICATIONS: NONE ANESTHESIA/SEDATION: NONE FLUOROSCOPY TIME:  Fluoroscopy Time:  42 SECONDS COMPLICATIONS: None immediate. PROCEDURE: The patient's son was advised of the possible risks and complications and agreed to undergo the procedure. The patient was then brought to the  angiographic suite for the procedure. The left arm was prepped with chlorhexidine, draped in the usual sterile fashion using maximum barrier technique (cap and mask, sterile gown, sterile gloves, large sterile sheet, hand hygiene and cutaneous antisepsis) and infiltrated locally with 1% Lidocaine. Ultrasound demonstrated patency of the left cephalic vein, and this was documented with an image. Under real-time ultrasound guidance, this vein was accessed with a 21 gauge micropuncture needle and image documentation was performed. A 0.018 wire was introduced in to the vein. Over this, a 5 [place]  double lumen power-injectable PICC was advanced to the lower SVC/right atrial junction. Fluoroscopy during the procedure and fluoro spot radiograph confirms appropriate catheter position. The catheter was flushed and covered with a sterile dressing. Catheter length:  52 cm IMPRESSION: Successful left arm Power PICC line placement with ultrasound and fluoroscopic guidance. The catheter is ready for use. Read by: Jeananne Rama, PA-C Electronically Signed   By: Malachy Moan M.D.   On: 01/24/2016 19:21   Ir US Guide Vasc Access Left  01/25/2016  INDICATION: Bacteremia; request made for central venous access for antibiotic therapy. EXAM: LEFT UPPER EXTREMITY PICC LINE PLACEMENT WITH ULTRASOUND AND FLUOROSCOPIC GUIDANCE MEDICATIONS: NONE ANESTHESIA/SEDATION: NONE FLUOROSCOPY TIME:  Fluoroscopy Time:  42 SECONDS COMPLICATIONS: None immediate. PROCEDURE: The patient's son was advised of the possible risks and complications and agreed to undergo the procedure. The patient was then brought to the angiographic suite for the procedure. The left arm was prepped with chlorhexidine, draped in the usual sterile fashion using maximum barrier technique (cap and mask, sterile gown, sterile gloves, large sterile sheet, hand hygiene and cutaneous antisepsis) and infiltrated locally with 1% Lidocaine. Ultrasound demonstrated patency of the  left cephalic vein, and this was documented with an image. Under real-time ultrasound guidance, this vein was accessed with a 21 gauge micropuncture needle and image documentation was performed. A 0.018 wire was introduced in to the vein. Over this, a 5 [place] double lumen power-injectable PICC was advanced to the lower SVC/right atrial junction. Fluoroscopy during the procedure and fluoro spot radiograph confirms appropriate catheter position. The catheter was flushed and covered with a sterile dressing. Catheter length:  52 cm IMPRESSION: Successful left arm Power PICC line placement with ultrasound and fluoroscopic guidance. The catheter is ready for use. Read by: Jeananne Rama, PA-C Electronically Signed   By: Malachy Moan M.D.   On: 01/24/2016 19:21   Dg Chest Port 1 View  01/20/2016  CLINICAL DATA:  Fever for 1 day EXAM: PORTABLE CHEST 1 VIEW COMPARISON:  Chest radiograph January 03, 2016; chest CT January 08, 2016 FINDINGS: There is no edema or consolidation. Heart size and pulmonary vascularity are normal. No adenopathy. No bone lesions. IMPRESSION: No edema or consolidation. Electronically Signed   By: Bretta Bang III M.D.   On: 01/20/2016 12:11   Dg Chest Port 1 View  01/03/2016  CLINICAL DATA:  50 year old female with sepsis and tachypnea EXAM: PORTABLE CHEST 1 VIEW COMPARISON:  Radiograph dated 12/04/2015 FINDINGS: The right-sided PICC is retracted and the tip now lies in the proximal right upper extremity likely in the axillary or brachial vein. Recommend evaluation and adjustment. Single-view of the chest does not demonstrate a focal consolidation. The lungs are hypovolemic. There is no pleural effusion or pneumothorax. Stable cardiac silhouette. No acute osseous pathology. IMPRESSION: No acute cardiopulmonary process. Retracted right-sided PICC with tip in the proximal right arm. Recommend re-evaluation and repositioning. Electronically Signed   By: Elgie Collard M.D.   On: 01/03/2016  18:48    Microbiology: Recent Results (from the past 240 hour(s))  Culture, blood (routine x 2)     Status: None   Collection Time: 01/23/16  8:20 PM  Result Value Ref Range Status   Specimen Description BLOOD LEFT ARM  Final   Special Requests BOTTLES DRAWN AEROBIC AND ANAEROBIC 5 CC  Final   Culture   Final    NO GROWTH 5 DAYS Performed at [facility]    Report Status 01/29/2016 FINAL  Final  Culture,  blood (routine x 2)     Status: None   Collection Time: 01/23/16  8:23 PM  Result Value Ref Range Status   Specimen Description BLOOD LEFT HAND  Final   Special Requests BOTTLES DRAWN AEROBIC AND ANAEROBIC 5 CC  Final   Culture   Final    NO GROWTH 5 DAYS Performed at [facility]    Report Status 01/29/2016 FINAL  Final     Labs: Basic Metabolic Panel:  Recent Labs Lab 01/25/16 0506 01/26/16 0450 01/28/16 0500  NA 141 138 134*  K 3.3* 3.9 3.8  CL 113* 108 102  CO2 22 22 23   GLUCOSE 124* 116* 106*  BUN 19 19 17   CREATININE 0.42* 0.40* 0.36*  CALCIUM 8.4* 8.5* 8.4*   Liver Function Tests: No results for input(s): AST, ALT, ALKPHOS, BILITOT, PROT, ALBUMIN in the last 168 hours. No results for input(s): LIPASE, AMYLASE in the last 168 hours. No results for input(s): AMMONIA in the last 168 hours. CBC:  Recent Labs Lab 01/25/16 0506 01/26/16 0450 01/28/16 0500  WBC 8.1 8.6 8.6  HGB 8.1* 8.6* 8.5*  HCT 26.0* 27.4* 26.6*  MCV 89.7 86.4 86.6  PLT 260 325 347   Cardiac Enzymes: No results for input(s): CKTOTAL, CKMB, CKMBINDEX, TROPONINI in the last 168 hours. BNP: BNP (last 3 results)  Recent Labs  03/18/15 1240 08/29/15 1305  BNP 69.7 47.2    ProBNP (last 3 results) No results for input(s): PROBNP in the last 8760 hours.  CBG:  Recent Labs Lab 01/26/16 0749 01/27/16 0827 01/28/16 0802 01/30/16 0801  GLUCAP 108* 109* 105* 122*       Signed:  Calvert Cantor, MD [facility] 01/31/2016, 9:54 AM

## 2016-01-29 LAB — CULTURE, BLOOD (ROUTINE X 2)
CULTURE: NO GROWTH
Culture: NO GROWTH

## 2016-01-29 NOTE — Clinical Social Work Note (Addendum)
CSW followed up with [facility] regarding referral and hospice home placement. Facility states that they may be able to admit the patient today, but they request the latest progress note and discharge summary. This information has been faxed to Debbie at the facility. Eunice BlaseDebbie states she will contact CSW once information has been reviewed. Eunice BlaseDebbie states that if they cannot admit the patient today, they should be able to admit the patient on 3/27.   Spoke with son [name] and updated on the situation.   Roddie McBryant Jasmin Trumbull MSW, [place], [place], [phone]

## 2016-01-29 NOTE — Progress Notes (Signed)
[facility] Progress Note   [name]  ZOX:[id number]  DOB: November 13, 1965  DOA: 01/20/2016 PCP: Ruthe Mannan, MD  Brief narrative: [name] is a 50 y.o. 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 Hospitalized on 1/28 through 2/2 for sepsis secondary to ESBL UTI Hospitalized/28 through 3/8 with Proteus mirabilis UTI, bright red blood per rectum Sent from skilled nursing facility with fever and hypotension tachycardia and a WBC count of 12.4. Subsequently developed diarrhea which is negative for C. difficile colitis. Blood cultures grew out coag-negative staph/MRSE and urine culture has grown Klebsiella, Proteus and [name] here.     Subjective: Noncommunicative  Assessment/Plan: Principal Problem: Disposition:  - have discussed poor quality of life with son today- he will be talking to his family to make a decision on comfort care today ADDENDUM- son has decided on comfort care but would like tube feeds to continue. - DNR discussed Looking for hospice bed- he would like the facility in [place]. Will likely have bed by tomorrow. Life expectancy < 30 days if infection is not treated.      Sepsis  (A) MRSE bacteremia- from sacral wound?- ID recommends 2 weeks of vancomycin-repeat blood culture negative (B) ESBL UTI secondary to chronic Foley versus colonization-ID recommends 5 days of meropenem-meropenem was started on 3/17 but missed 3 days 18,19 and 20 due to lack of IV- now recived a full 5 days   Stopping Vancomycin today  Active Problems:    Decubitus ulcer of sacral region, stage 4  with soiling secondary to stool - Wound care instructions: Cleanse wound with NS and pat gently dry. Apply black granufoam to wound  bed. Bridge dressing to hip to offload pressure. Change Mon/Wed/Fri -Having loose stools which might soil the wound- Rectal pouch if needed- see below -Wound does not appear to be infected   Loose stools  - from tube feeds? Soiling her wound- have changed tube fee from jevity to osmolite today per dietician recommendations to see if loose stools resolve- stool appear to be more formed per RN  Hypokalemia - due to loose stools-Replaced  History of intercerebral hemorrhage during clipping of aneurysm- status post PEG -Nonverbal, nonmobile, does not follow commands -Continue PEG tube feeds  Tachycardia - significantly improved from admission (130s) to low 100s now- continues depite adequate hydration and Metoprolol (for HTN)  Normocytic anemia - Baseline Hgb 8.4, 7.7 on admission, Transfused 1 U PRBC , hemoglobin stable since transfusion.  Essential hypertension -  medication initially on hold secondary to hypertension, currently blood pressure improved - cont metoprolol  Seizure disorder (HCC) - Continue Vimpat and Keppra   Antibiotics: Anti-infectives    Start     Dose/Rate Route Frequency Ordered Stop   01/28/16 0000  Vancomycin (VANCOCIN) 750 MG/150ML SOLN    Comments:  Dose to be adjusted by pharmacy based on Troughs   750 mg 150 mL/hr over 60 Minutes Intravenous Every 12 hours 01/28/16 1015     01/27/16 2200  vancomycin (VANCOCIN) IVPB 750 mg/150 ml premix  Status:  Discontinued     750 mg 150 mL/hr over 60 Minutes Intravenous Every 12 hours 01/27/16 2156 01/28/16 1549   01/24/16 2000  meropenem (MERREM) 1 g in sodium chloride 0.9 % 100 mL IVPB  Status:  Discontinued  1 g 200 mL/hr over 30 Minutes Intravenous Every 8 hours 01/24/16 1945 01/27/16 0734   01/24/16 2000  vancomycin (VANCOCIN) 1,500 mg in sodium chloride 0.9 % 500 mL IVPB  Status:  Discontinued     1,500 mg 250 mL/hr over 120 Minutes Intravenous Every 24 hours 01/24/16 1947 01/27/16 2156   01/24/16 0600   vancomycin (VANCOCIN) 1,500 mg in sodium chloride 0.9 % 500 mL IVPB  Status:  Discontinued     1,500 mg 250 mL/hr over 120 Minutes Intravenous Every 24 hours 01/23/16 1430 01/24/16 1946   01/23/16 1300  meropenem (MERREM) 1 g in sodium chloride 0.9 % 100 mL IVPB  Status:  Discontinued     1 g 200 mL/hr over 30 Minutes Intravenous Every 8 hours 01/23/16 1254 01/24/16 1944   01/22/16 1400  cefTRIAXone (ROCEPHIN) 1 g in dextrose 5 % 50 mL IVPB  Status:  Discontinued     1 g 100 mL/hr over 30 Minutes Intravenous Every 24 hours 01/22/16 1211 01/23/16 1252   01/21/16 0200  vancomycin (VANCOCIN) IVPB 1000 mg/200 mL premix  Status:  Discontinued     1,000 mg 200 mL/hr over 60 Minutes Intravenous Every 12 hours 01/20/16 1243 01/23/16 1430   01/20/16 1800  meropenem (MERREM) 1 g in sodium chloride 0.9 % 100 mL IVPB  Status:  Discontinued     1 g 200 mL/hr over 30 Minutes Intravenous Every 8 hours 01/20/16 1243 01/22/16 1211   01/20/16 1230  piperacillin-tazobactam (ZOSYN) IVPB 3.375 g     3.375 g 100 mL/hr over 30 Minutes Intravenous  Once 01/20/16 1217 01/20/16 1255   01/20/16 1230  vancomycin (VANCOCIN) IVPB 1000 mg/200 mL premix     1,000 mg 200 mL/hr over 60 Minutes Intravenous  Once 01/20/16 1217 01/20/16 1343     Code Status:     Code Status Orders        Start     Ordered   01/20/16 1515  Full code   Continuous     01/20/16 1514    Code Status History    Date Active Date Inactive Code Status Order ID Comments User Context   01/03/2016  9:48 PM 01/11/2016  6:38 PM Full Code [id number]  Briscoe Deutscherimothy S Opyd, MD ED   12/04/2015  8:31 AM 12/09/2015  4:07 PM Full Code [id number]  Maryruth Bunhristina P Rama, MD Inpatient   10/12/2015  7:29 PM 10/26/2015  5:28 PM Full Code [id number]  Rahul Astrid DraftsP Desai, PA-C ED   08/28/2015  6:20 PM 09/06/2015  6:49 PM Full Code [id number]  Hollice EspySendil K Krishnan, MD Inpatient   06/01/2015  8:34 PM 06/06/2015  5:56 PM Full Code [id number]  Ron ParkerHarvette C Jenkins, MD Inpatient   03/16/2015  5:22  PM 03/22/2015  7:54 PM Full Code [id number]  Haydee Monicaachal A David, MD Inpatient   03/31/2014 10:31 AM 04/07/2014  7:32 PM Full Code [id number]  Coralyn HellingVineet Sood, MD ED     Family Communication: son Torrie [phone] Disposition Plan: hospice DVT prophylaxis: Heparin Consultants: ID Procedures:     Objective: Filed Weights   01/24/16 0533 01/25/16 0509 01/26/16 0402  Weight: 85.1 kg (187 lb 9.8 oz) 78.8 kg (173 lb 11.6 oz) 78.2 kg (172 lb 6.4 oz)    Intake/Output Summary (Last 24 hours) at 01/29/16 1201 Last data filed at 01/29/16 0600  Gross per 24 hour  Intake    190 ml  Output   1900 ml  Net  -1710 ml     Vitals  Filed Vitals:   01/28/16 1400 01/28/16 1545 01/28/16 2115 01/29/16 0532  BP:  108/65 94/67 105/72  Pulse:  103 81 88  Temp: 100 F (37.8 C) 100.3 F (37.9 C) 99.9 F (37.7 C) 99 F (37.2 C)  TempSrc: Oral Oral Oral Oral  Resp:  Height:      Weight:      SpO2:  100% 100% 99%    Exam:  General:  Pt is alert,Does not communicate, not in acute distress  HEENT: No icterus, No thrush, oral mucosa moist  Cardiovascular: regular rate and rhythm, S1/S2 No murmur  Respiratory: clear to auscultation bilaterally   Abdomen: Soft, +Bowel sounds, non tender, non distended, no guarding-Peg tube intact  MSK: No cyanosis or clubbing- no pedal edema-contracted extremities  Skin: Wound VAC on sacral decubitus ulcer   Data Reviewed: Basic Metabolic Panel:  Recent Labs Lab 01/23/16 0517 01/24/16 0518 01/25/16 0506 01/26/16 0450 01/28/16 0500  NA 147* 142 141 138 134*  K 3.4* 3.9 3.3* 3.9 3.8  CL 118* 113* 113* 108 102  CO2 19* 20* GLUCOSE 108* 119* 124* 116* 106*  BUN CREATININE 0.50 0.44 0.42* 0.40* 0.36*  CALCIUM 8.6* 8.5* 8.4* 8.5* 8.4*   Liver Function Tests: No results for input(s): AST, ALT, ALKPHOS, BILITOT, PROT, ALBUMIN in the last 168 hours. No results for input(s): LIPASE, AMYLASE in the last 168 hours. No results  for input(s): AMMONIA in the last 168 hours. CBC:  Recent Labs Lab 01/23/16 0517 01/24/16 0518 01/25/16 0506 01/26/16 0450 01/28/16 0500  WBC 6.9 8.2 8.1 8.6 8.6  HGB 8.1* 8.2* 8.1* 8.6* 8.5*  HCT 26.4* 26.8* 26.0* 27.4* 26.6*  MCV 91.3 89.6 89.7 86.4 86.6  PLT 257 263 260 325 347   Cardiac Enzymes: No results for input(s): CKTOTAL, CKMB, CKMBINDEX, TROPONINI in the last 168 hours. BNP (last 3 results)  Recent Labs  03/18/15 1240 08/29/15 1305  BNP 69.7 47.2    ProBNP (last 3 results) No results for input(s): PROBNP in the last 8760 hours.  CBG:  Recent Labs Lab 01/23/16 0741 01/24/16 0803 01/26/16 0749 01/27/16 0827 01/28/16 0802  GLUCAP 90 106* 108* 109* 105*    Recent Results (from the past 240 hour(s))  Blood Culture (routine x 2)     Status: None   Collection Time: 01/20/16 11:47 AM  Result Value Ref Range Status   Specimen Description BLOOD RIGHT FOREARM  Final   Special Requests BOTTLES DRAWN AEROBIC AND ANAEROBIC  Final   Culture  Setup Time   Final    GRAM POSITIVE COCCI IN CLUSTERS CRITICAL RESULT CALLED TO, READ BACK BY AND VERIFIED WITH: S DILLAN,RNA AT 1156 01/21/16 BY L BENFIELD IN BOTH AEROBIC AND ANAEROBIC BOTTLES    Culture   Final    STAPHYLOCOCCUS SPECIES (COAGULASE NEGATIVE) Performed at [facility]    Report Status 01/24/2016 FINAL  Final   Organism ID, Bacteria STAPHYLOCOCCUS SPECIES (COAGULASE NEGATIVE)  Final      Susceptibility   Staphylococcus species (coagulase negative) - MIC*    CIPROFLOXACIN >=8 RESISTANT Resistant     ERYTHROMYCIN >=8 RESISTANT Resistant     GENTAMICIN 4 SENSITIVE Sensitive     OXACILLIN >=4 RESISTANT Resistant     TETRACYCLINE 2 SENSITIVE Sensitive     VANCOMYCIN 1 SENSITIVE Sensitive     TRIMETH/SULFA 80 RESISTANT Resistant     CLINDAMYCIN >=8 RESISTANT Resistant  RIFAMPIN >=32 RESISTANT Resistant     Inducible Clindamycin NEGATIVE Sensitive     * STAPHYLOCOCCUS SPECIES (COAGULASE  NEGATIVE)  Urine culture     Status: None   Collection Time: 01/20/16 11:57 AM  Result Value Ref Range Status   Specimen Description URINE, CATHETERIZED  Final   Special Requests NONE  Final   Culture   Final    >=100,000 COLONIES/mL KLEBSIELLA PNEUMONIAE 50,000 COLONIES/mL PROTEUS MIRABILIS ORGANISM 1 Confirmed Extended Spectrum Beta-Lactamase Producer (ESBL) Performed at [facility]    Report Status 01/23/2016 FINAL  Final   Organism ID, Bacteria PROTEUS MIRABILIS  Final   Organism ID, Bacteria KLEBSIELLA PNEUMONIAE  Final      Susceptibility   Klebsiella pneumoniae - MIC*    AMPICILLIN >=32 RESISTANT Resistant     CEFAZOLIN >=64 RESISTANT Resistant     CEFTRIAXONE >=64 RESISTANT Resistant     CIPROFLOXACIN >=4 RESISTANT Resistant     GENTAMICIN <=1 SENSITIVE Sensitive     IMIPENEM <=0.25 SENSITIVE Sensitive     NITROFURANTOIN 64 INTERMEDIATE Intermediate     TRIMETH/SULFA >=320 RESISTANT Resistant     AMPICILLIN/SULBACTAM >=32 RESISTANT Resistant     PIP/TAZO 16 SENSITIVE Sensitive     * >=100,000 COLONIES/mL KLEBSIELLA PNEUMONIAE   Proteus mirabilis - MIC*    AMPICILLIN <=2 SENSITIVE Sensitive     CEFAZOLIN <=4 SENSITIVE Sensitive     CEFTRIAXONE <=1 SENSITIVE Sensitive     CIPROFLOXACIN >=4 RESISTANT Resistant     GENTAMICIN <=1 SENSITIVE Sensitive     IMIPENEM 4 SENSITIVE Sensitive     NITROFURANTOIN 64 RESISTANT Resistant     TRIMETH/SULFA <=20 SENSITIVE Sensitive     AMPICILLIN/SULBACTAM <=2 SENSITIVE Sensitive     PIP/TAZO <=4 SENSITIVE Sensitive     * 50,000 COLONIES/mL PROTEUS MIRABILIS  Blood Culture (routine x 2)     Status: None   Collection Time: 01/20/16 12:06 PM  Result Value Ref Range Status   Specimen Description BLOOD RIGHT ANTECUBITAL  Final   Special Requests BOTTLES DRAWN AEROBIC AND ANAEROBIC  Final   Culture  Setup Time   Final    GRAM POSITIVE COCCI IN CLUSTERS AEROBIC BOTTLE CALLED TO SHEEK,S RN 01/21/16 1439 WOOTEN,K CONFIRMED BY  V WILKINS    Culture   Final    STAPHYLOCOCCUS SPECIES (COAGULASE NEGATIVE) SUSCEPTIBILITIES PERFORMED ON PREVIOUS CULTURE WITHIN THE LAST 5 DAYS. Performed at [facility]    Report Status 01/24/2016 FINAL  Final  Culture, blood (x 2)     Status: None   Collection Time: 01/20/16  3:24 PM  Result Value Ref Range Status   Specimen Description BLOOD LEFT ARM  Final   Special Requests IN PEDIATRIC BOTTLE 4CC  Final   Culture   Final    NO GROWTH 5 DAYS Performed at [facility]    Report Status 01/25/2016 FINAL  Final  Culture, blood (x 2)     Status: None   Collection Time: 01/20/16  3:24 PM  Result Value Ref Range Status   Specimen Description BLOOD LEFT ARM  Final   Special Requests IN PEDIATRIC BOTTLE 4CC  Final   Culture   Final    NO GROWTH 5 DAYS Performed at [facility]    Report Status 01/25/2016 FINAL  Final  MRSA PCR Screening     Status: None   Collection Time: 01/20/16  6:56 PM  Result Value Ref Range Status   MRSA by PCR NEGATIVE NEGATIVE Final  Comment:        The GeneXpert MRSA Assay (FDA approved for NASAL specimens only), is one component of a comprehensive MRSA colonization surveillance program. It is not intended to diagnose MRSA infection nor to guide or monitor treatment for MRSA infections.   Urine culture     Status: None   Collection Time: 01/20/16  7:15 PM  Result Value Ref Range Status   Specimen Description URINE, CLEAN CATCH  Final   Special Requests NONE  Final   Culture   Final    >=100,000 COLONIES/mL PROVIDENCIA STUARTII >=100,000 COLONIES/mL PROTEUS MIRABILIS Performed at [facility]    Report Status 01/24/2016 FINAL  Final   Organism ID, Bacteria PROVIDENCIA STUARTII  Final   Organism ID, Bacteria PROTEUS MIRABILIS  Final      Susceptibility   Proteus mirabilis - MIC*    AMPICILLIN <=2 SENSITIVE Sensitive     CEFAZOLIN <=4 SENSITIVE Sensitive     CEFTRIAXONE <=1 SENSITIVE Sensitive      CIPROFLOXACIN >=4 RESISTANT Resistant     GENTAMICIN <=1 SENSITIVE Sensitive     IMIPENEM 4 SENSITIVE Sensitive     NITROFURANTOIN 128 RESISTANT Resistant     TRIMETH/SULFA <=20 SENSITIVE Sensitive     AMPICILLIN/SULBACTAM <=2 SENSITIVE Sensitive     PIP/TAZO <=4 SENSITIVE Sensitive     * >=100,000 COLONIES/mL PROTEUS MIRABILIS   Providencia stuartii - MIC*    AMPICILLIN >=32 RESISTANT Resistant     CEFAZOLIN >=64 RESISTANT Resistant     CEFTRIAXONE <=1 SENSITIVE Sensitive     CIPROFLOXACIN >=4 RESISTANT Resistant     GENTAMICIN 4 RESISTANT Resistant     IMIPENEM 0.5 SENSITIVE Sensitive     NITROFURANTOIN 256 RESISTANT Resistant     TRIMETH/SULFA <=20 SENSITIVE Sensitive     AMPICILLIN/SULBACTAM 16 INTERMEDIATE Intermediate     PIP/TAZO <=4 SENSITIVE Sensitive     * >=100,000 COLONIES/mL PROVIDENCIA STUARTII  C difficile quick scan w PCR reflex     Status: None   Collection Time: 01/21/16  9:01 AM  Result Value Ref Range Status   C Diff antigen NEGATIVE NEGATIVE Final   C Diff toxin NEGATIVE NEGATIVE Final   C Diff interpretation Negative for toxigenic C. difficile  Final  Culture, blood (routine x 2)     Status: None (Preliminary result)   Collection Time: 01/23/16  8:20 PM  Result Value Ref Range Status   Specimen Description BLOOD LEFT ARM  Final   Special Requests BOTTLES DRAWN AEROBIC AND ANAEROBIC 5 CC  Final   Culture   Final    NO GROWTH 4 DAYS Performed at [facility]    Report Status PENDING  Incomplete  Culture, blood (routine x 2)     Status: None (Preliminary result)   Collection Time: 01/23/16  8:23 PM  Result Value Ref Range Status   Specimen Description BLOOD LEFT HAND  Final   Special Requests BOTTLES DRAWN AEROBIC AND ANAEROBIC 5 CC  Final   Culture   Final    NO GROWTH 4 DAYS Performed at [facility]    Report Status PENDING  Incomplete     Studies: No results found.  Scheduled Meds:  Scheduled Meds: . sodium chloride    Intravenous Once  . acetaminophen  650 mg Per Tube Daily  . antiseptic oral rinse  7 mL Mouth Rinse q12n4p  . chlorhexidine  15 mL Mouth Rinse BID  . cholestyramine light  4 g Oral TID  . feeding  supplement (PRO-STAT SUGAR FREE 64)  60 mL Per Tube BID  . free water  200 mL Oral 3 times per day  . heparin subcutaneous  5,000 Units Subcutaneous 3 times per day  . lacosamide  100 mg Per Tube BID  . levETIRAcetam  1,500 mg Per Tube BID  . loperamide  2 mg Oral BID  . metoprolol  50 mg Oral BID  . multivitamin with minerals  1 tablet Per Tube Q breakfast  . polyvinyl alcohol  2 drop Both Eyes TID  . ranitidine  150 mg Per Tube BID  . sodium chloride flush  3 mL Intravenous Q12H  . vitamin C  1,000 mg Oral Daily   Continuous Infusions: . feeding supplement (OSMOLITE 1.5 CAL) 1,000 mL (01/28/16 1053)    Time spent on care of this patient: 40 min   Kartier Bennison, MD 01/29/2016, 12:01 PM  LOS: 9 days   [facility] Office  [phone] Pager - Text Page per [email or web address] If 7PM-7AM, please contact night-coverage [email or web address]

## 2016-01-30 DIAGNOSIS — Z515 Encounter for palliative care: Secondary | ICD-10-CM

## 2016-01-30 DIAGNOSIS — D649 Anemia, unspecified: Secondary | ICD-10-CM

## 2016-01-30 DIAGNOSIS — R52 Pain, unspecified: Secondary | ICD-10-CM

## 2016-01-30 DIAGNOSIS — Z66 Do not resuscitate: Secondary | ICD-10-CM

## 2016-01-30 LAB — GLUCOSE, CAPILLARY: Glucose-Capillary: 122 mg/dL — ABNORMAL HIGH (ref 65–99)

## 2016-01-30 MED ORDER — OSMOLITE 1.5 CAL PO LIQD: 1000.0000 mL | ORAL | Status: DC

## 2016-01-30 MED ORDER — LORAZEPAM 1 MG PO TABS: 1.0000 mg | ORAL_TABLET | ORAL | Status: DC | PRN

## 2016-01-30 MED ORDER — MORPHINE SULFATE (CONCENTRATE) 10 MG/0.5ML PO SOLN: 5.0000 mg | ORAL | Status: DC | PRN

## 2016-01-30 MED ORDER — OSMOLITE 1.5 CAL PO LIQD
1000.0000 mL | ORAL | Status: DC
  Administered 2016-01-30: 1000 mL
  Filled 2016-01-30: qty 1000

## 2016-01-30 NOTE — Consult Note (Signed)
Consultation Note Date: 01/30/2016   Patient Name: [name]  DOB: 06/24/1966  MRN: [id number]  Age / Sex: 50 y.o., female  PCP: Dianne Dunalia M Aron, MD Referring Physician: Calvert CantorSaima Rizwan, MD  Reason for Consultation: Establishing goals of care, Hospice Evaluation, Non pain symptom management, Pain control and Psychosocial/spiritual support  Clinical Assessment/Narrative:   I have worked with this family on multiple admissions.  Today I spoke with Dorisann Framesorie Willemsen, the patient's son,  at request of attending, family has begun to make a shift to a more comfort path.   Torie is asking for information on hospice and possible options for discharge.    Consult is for, clarification of goals of care and end of life issues, disposition and options, and symptom recommendation.  This NP Lorinda CreedMary Mico Spark reviewed medical records, received report from team, assessed the patient and then meet at the patient's bedside and spoke by phone with her son  to discuss  prognosis, GOC, EOL wishes disposition and options.   A  discussion was had today regarding advanced directives.  Concepts specific to code status, artifical feeding and hydration, continued IV antibiotics and rehospitalization was had.  The difference between a aggressive medical intervention path  and a palliative comfort care path for this patient at this time was had.  Values and goals of care important to patient and family were attempted to be elicited.  Concept of Hospice was  discussed  Natural trajectory and expectations at EOL were discussed.  Questions and concerns addressed.   PMT will continue to support holistically.  Primary Decision Maker: son/Torie Fambro HCPOA: yes    SUMMARY OF RECOMMENDATIONS   -shift to full comfort, no further life prolonging measures -begin to wean patient off TF, will cut rate by 50 % for 24 hrs then off, dc free water -no further  antibiotics, labe draws -symptom management to promote comfort   Code Status/Advance Care Planning: DNR    Code Status Orders        Start     Ordered   01/28/16 1756  Do not attempt resuscitation (DNR)   Continuous    Question Answer Comment  In the event of cardiac or respiratory ARREST Do not call a "code blue"   In the event of cardiac or respiratory ARREST Do not perform Intubation, CPR, defibrillation or ACLS   In the event of cardiac or respiratory ARREST Use medication by any route, position, wound care, and other measures to relive pain and suffering. May use oxygen, suction and manual treatment of airway obstruction as needed for comfort.      01/28/16 1755    Code Status History    Date Active Date Inactive Code Status Order ID Comments User Context   01/20/2016  3:14 PM 01/28/2016  5:55 PM Full Code [id number]  Alison MurrayAlma M Devine, MD Inpatient   01/03/2016  9:48 PM 01/11/2016  6:38 PM Full Code [id number]  Briscoe Deutscherimothy S Opyd, MD ED   12/04/2015  8:31 AM 12/09/2015  4:07 PM Full Code [id number]  Maryruth Bunhristina P Rama, MD Inpatient   10/12/2015  7:29 PM 10/26/2015  5:28 PM Full Code [id number]  Kathlene Coteahul P Desai, PA-C ED   08/28/2015  6:20 PM 09/06/2015  6:49 PM Full Code [id number]  Hollice EspySendil K Krishnan, MD Inpatient   06/01/2015  8:34 PM 06/06/2015  5:56 PM Full Code [id number]  Ron ParkerHarvette C Jenkins, MD Inpatient   03/16/2015  5:22 PM 03/22/2015  7:54 PM Full Code [id number]  Haydee Monicaachal A David,  MD Inpatient   03/31/2014 10:31 AM 04/07/2014  7:32 PM Full Code [id number]  Coralyn Helling, MD ED        Symptom Management:   Pain/Dyspnea: Roxanol  Agitation: Ativan  Wound care: daily and prn, comfort is focus of care  Palliative Prophylaxis:   Aspiration, Bowel Regimen, Frequent Pain Assessment, Oral Care, Palliative Wound Care and Turn Reposition  Additional Recommendations (Limitations, Scope, Preferences):  Full Comfort Care  Psycho-social/Spiritual:  Support System: Fair Desire for further Chaplaincy  support:no Additional Recommendations: Education on Hospice  Prognosis: < 2 weeks  Discharge Planning: Hospice facility, will write for choice   Chief Complaint/ Primary Diagnoses: Present on Admission:  . Sepsis secondary to UTI (HCC) . Leukocytosis . UTI (urinary tract infection) due to urinary indwelling Foley catheter (HCC) . Decubitus ulcer of sacral region, stage 4 (HCC) . FTT (failure to thrive) in adult . Normocytic anemia . Hypernatremia . Essential hypertension  I have reviewed the medical record, interviewed the patient and family, and examined the patient. The following aspects are pertinent.  Past Medical History  Diagnosis Date  . Hypertension   . Stroke (HCC)   . Anemia    Social History   Social History  . Marital Status: Single    Spouse Name: N/A  . Number of Children: N/A  . Years of Education: N/A   Social History Main Topics  . Smoking status: Former Smoker    Types: Cigarettes  . Smokeless tobacco: Never Used     Comment: trying to quit-2 cigarettes daily  . Alcohol Use: No     Comment: Very seldom  . Drug Use: No  . Sexual Activity: No   Other Topics Concern  . None   Social History Narrative   Resident of [place]. Bed bound.   Family History  Problem Relation Age of Onset  . Heart disease Mother 77    MI   Scheduled Meds: . sodium chloride   Intravenous Once  . acetaminophen  650 mg Per Tube Daily  . antiseptic oral rinse  7 mL Mouth Rinse q12n4p  . chlorhexidine  15 mL Mouth Rinse BID  . cholestyramine light  4 g Oral TID  . feeding supplement (PRO-STAT SUGAR FREE 64)  60 mL Per Tube BID  . free water  200 mL Oral 3 times per day  . heparin subcutaneous  5,000 Units Subcutaneous 3 times per day  . lacosamide  100 mg Per Tube BID  . levETIRAcetam  1,500 mg Per Tube BID  . loperamide  2 mg Oral BID  . metoprolol  50 mg Oral BID  . multivitamin with minerals  1 tablet Per Tube Q breakfast  . polyvinyl alcohol  2 drop Both  Eyes TID  . ranitidine  150 mg Per Tube BID  . sodium chloride flush  3 mL Intravenous Q12H  . vitamin C  1,000 mg Oral Daily   Continuous Infusions: . feeding supplement (OSMOLITE 1.5 CAL) 1,000 mL (01/30/16 0612)   PRN Meds:.acetaminophen **OR** acetaminophen, ondansetron, sodium chloride flush, traZODone Medications Prior to Admission:  Prior to Admission medications   Medication Sig Start Date End Date Taking? Authorizing Provider  acetaminophen (TYLENOL) 325 MG tablet Place 2 tablets (650 mg total) into feeding tube every 6 (six) hours as needed for mild pain (or Fever >/= 101). Patient taking differently: Place 650 mg into feeding tube daily.  01/11/16  Yes Lonia Blood, MD  Amino Acids-Protein Hydrolys (FEEDING SUPPLEMENT, PRO-STAT SUGAR FREE  64,) LIQD Place 60 mLs into feeding tube 2 (two) times daily. 01/11/16  Yes Lonia Blood, MD  antiseptic oral rinse (CPC / CETYLPYRIDINIUM CHLORIDE 0.05%) 0.05 % LIQD solution 7 mLs by Mouth Rinse route 2 (two) times daily. 12/08/15  Yes Alison Murray, MD  Ascorbic Acid (VITAMIN C) 1000 MG tablet Give 1,000 mg by tube daily.    Yes Historical Provider, MD  bisacodyl (DULCOLAX) 10 MG suppository Place 1 suppository (10 mg total) rectally daily as needed for moderate constipation. 01/11/16  Yes Lonia Blood, MD  carboxymethylcellulose (REFRESH PLUS) 0.5 % SOLN Place 2 drops into both eyes 3 (three) times daily.   Yes Historical Provider, MD  chlorhexidine (PERIDEX) 0.12 % solution 15 mLs by Mouth Rinse route 2 (two) times daily. 01/11/16  Yes Lonia Blood, MD  Cholecalciferol (VITAMIN D3) 5000 UNITS TABS Take 5,000 Units by mouth daily with breakfast.    Yes Historical Provider, MD  cholestyramine light (PREVALITE) 4 G packet Take 4 g by mouth 3 (three) times daily. mix in 8 oz. Of Liquid   Yes Historical Provider, MD  famotidine (PEPCID) 40 MG/5ML suspension Take 20 mg by mouth every 12 (twelve) hours.   Yes Historical Provider, MD    ferrous sulfate 300 (60 Fe) MG/5ML syrup Place 5 mLs (300 mg total) into feeding tube 2 (two) times daily with a meal. 01/11/16  Yes Lonia Blood, MD  lacosamide 100 MG TABS Place 1 tablet (100 mg total) into feeding tube 2 (two) times daily. 01/11/16  Yes Lonia Blood, MD  levETIRAcetam (KEPPRA) 100 MG/ML solution Place 15 mLs (1,500 mg total) into feeding tube 2 (two) times daily. 01/11/16  Yes Lonia Blood, MD  lisinopril (PRINIVIL,ZESTRIL) 10 MG tablet Place 1 tablet (10 mg total) into feeding tube daily. 01/11/16  Yes Lonia Blood, MD  loperamide (IMODIUM A-D) 2 MG tablet Give 4 mg by tube 4 (four) times daily as needed for diarrhea or loose stools.   Yes Historical Provider, MD  metoprolol (LOPRESSOR) 50 MG tablet Give 50 mg by tube 2 (two) times daily.   Yes Historical Provider, MD  Multiple Vitamin (MULTIVITAMIN WITH MINERALS) TABS tablet Place 1 tablet into feeding tube daily with breakfast. 01/11/16  Yes Lonia Blood, MD  Nutritional Supplements (FEEDING SUPPLEMENT, JEVITY 1.5 CAL/FIBER,) LIQD Place 1,000 mLs into feeding tube continuous. Patient taking differently: Place 60 mL/hr into feeding tube continuous.  01/11/16  Yes Lonia Blood, MD  ondansetron (ZOFRAN) 4 MG tablet Place 1 tablet (4 mg total) into feeding tube every 6 (six) hours as needed for nausea. 01/11/16  Yes Lonia Blood, MD  polyethylene glycol [facility] / GLYCOLAX) packet Place 17 g into feeding tube daily. 01/11/16  Yes Lonia Blood, MD  Potassium Chloride 25 MEQ PACK Take 25 mEq by mouth daily.   Yes Historical Provider, MD  ranitidine (ZANTAC) 150 MG/10ML syrup Place 10 mLs (150 mg total) into feeding tube 2 (two) times daily. 01/11/16  Yes Lonia Blood, MD  traZODone (DESYREL) 50 MG tablet Place 0.5 tablets (25 mg total) into feeding tube at bedtime as needed for sleep. 01/11/16  Yes Lonia Blood, MD  Water For Irrigation, Sterile (FREE WATER) SOLN Take 100 mLs by mouth every 8 (eight)  hours. Patient taking differently: Take 200 mLs by mouth every 8 (eight) hours.  01/11/16  Yes Lonia Blood, MD  loperamide (IMODIUM) 2 MG capsule Take 1 capsule (2 mg  total) by mouth 2 (two) times daily. 01/28/16   Calvert Cantor, MD  metoprolol 75 MG TABS Place 75 mg into feeding tube 2 (two) times daily. Patient not taking: Reported on 01/20/2016 01/11/16   Lonia Blood, MD  Nutritional Supplements (FEEDING SUPPLEMENT, OSMOLITE 1.5 CAL,) LIQD Place 1,000 mLs into feeding tube continuous. 45 cc/hr 01/30/16   Calvert Cantor, MD   Allergies  Allergen Reactions  . Amlodipine Swelling  . Latex Rash    Unknown   . Other Other (See Comments)    Natural Rubber- Unknown     Review of Systems  Unable to perform ROS: Patient nonverbal    Physical Exam  Constitutional: She appears ill.  Cardiovascular: Normal rate, regular rhythm and normal heart sounds.   Respiratory: She has decreased breath sounds in the right lower field and the left lower field.  Neurological: She is alert. She displays atrophy. She exhibits abnormal muscle tone.  Skin: Skin is warm and dry.  -noted skin breakdown per EMR    Vital Signs: BP 109/78 mmHg  Pulse 104  Temp(Src) 98.3 F (36.8 C) (Oral)  Resp 40  Ht 5\' 5"  (1.651 m)  Wt 78.8 kg (173 lb 11.6 oz)  BMI 28.91 kg/m2  SpO2 100%  SpO2: SpO2: 100 % O2 Device:SpO2: 100 % O2 Flow Rate: .   IO: Intake/output summary:  Intake/Output Summary (Last 24 hours) at 01/30/16 1006 Last data filed at 01/30/16 0543  Gross per 24 hour  Intake 1177.25 ml  Output   1850 ml  Net -672.75 ml    LBM: Last BM Date: 01/30/16 Baseline Weight: Weight: 83.462 kg (184 lb) (taken 3/8 per facility) Most recent weight: Weight: 78.8 kg (173 lb 11.6 oz)      Palliative Assessment/Data:    Additional Data Reviewed:  CBC:    Component Value Date/Time   WBC 8.6 01/28/2016 0500   WBC 8.9 08/11/2013 1521   HGB 8.5* 01/28/2016 0500   HGB 13.8 08/11/2013 1521   HCT 26.6*  01/28/2016 0500   HCT 41.5 08/11/2013 1521   PLT 347 01/28/2016 0500   PLT 189 08/11/2013 1521   MCV 86.6 01/28/2016 0500   MCV 88 08/11/2013 1521   NEUTROABS 8.8* 01/20/2016 1524   NEUTROABS 4.5 02/13/2013 0400   LYMPHSABS 2.7 01/20/2016 1524   LYMPHSABS 2.2 02/13/2013 0400   MONOABS 0.9 01/20/2016 1524   MONOABS 0.9 02/13/2013 0400   EOSABS 0.0 01/20/2016 1524   EOSABS 0.1 02/13/2013 0400   BASOSABS 0.0 01/20/2016 1524   BASOSABS 0.1 02/13/2013 0400   Comprehensive Metabolic Panel:    Component Value Date/Time   NA 134* 01/28/2016 0500   NA 137 08/11/2013 1521   K 3.8 01/28/2016 0500   K 3.4* 08/11/2013 1521   CL 102 01/28/2016 0500   CL 102 08/11/2013 1521   CO2 23 01/28/2016 0500   CO2 27 08/11/2013 1521   BUN 17 01/28/2016 0500   BUN 15 08/11/2013 1521   CREATININE 0.36* 01/28/2016 0500   CREATININE 0.99 08/11/2013 1521   GLUCOSE 106* 01/28/2016 0500   GLUCOSE 92 08/11/2013 1521   CALCIUM 8.4* 01/28/2016 0500   CALCIUM 8.7 08/11/2013 1521   AST 26 01/21/2016 0518   AST 24 08/11/2013 1521   ALT 50 01/21/2016 0518   ALT 31 08/11/2013 1521   ALKPHOS 125 01/21/2016 0518   ALKPHOS 104 08/11/2013 1521   BILITOT 0.7 01/21/2016 0518   BILITOT 0.3 08/11/2013 1521   PROT 6.7 01/21/2016 0518  PROT 8.0 08/11/2013 1521   ALBUMIN 2.2* 01/21/2016 0518   ALBUMIN 3.9 08/11/2013 1521   Discussed with DrSuzanna Kidd LCSW and Dr Butler Denmark  Time In: 1000 Time Out: 1100 Time Total: 60 min Greater than 50%  of this time was spent counseling and coordinating care related to the above assessment and plan.  Signed by: Lorinda Creed, NP  Canary Brim, NP  01/30/2016, 10:06 AM  Please contact Palliative Medicine Team phone at [phone] for questions and concerns.

## 2016-01-30 NOTE — Progress Notes (Signed)
CSW continuing to follow.   CSW spoke with [facility] this morning regarding follow up from referral over weekend. Per [facility], facility needs clarification with son regarding goals of care surrounding tube feeds as tube feeds is an aggressive medical intervention before [facility] can extend bed offer and facility did not have available bed today. CSW spoke with Palliative Care NP, Lorinda CreedMary Larach who was able to speak with pt son, Torrie regarding goals of care surrounding tube feeds and pt son agreeable to shift to full comfort and discontinue tube feeds. [facility] notified of clarification.   CSW discussed with pt son, Torrie via telephone. CSW notified pt son that [facility] did not have bed available today and requested to send additional referral to another hospice facility in case bed did not become available in a timely manner. Pt [facility] agreeable [facility]. [facility], but facility currently full.  Per PMT NP, Lorinda CreedMary Larach, tube feeds being weaned today and will be discontinued in AM and other symptom management initiated and PMT NP discussed with MD and pt will be off tube feeds tomorrow in order to discharge to a hospice facility on 3/28.  CSW later in the afternoon received notification from [facility] that facility has bed available for pt on Tuesday 3/28 and plans to meet with pt son at 9 am and request transport for pt to be arranged for 11 am.   CSW notified son via telephone and updated MD.  CSW to facilitated pt discharge to [facility] tomorrow morning.  Loletta SpecterSuzanna Kidd, MSW, LCSW Clinical Social Work [phone]

## 2016-01-30 NOTE — Consult Note (Signed)
Ch attempted to visit; no family present and pt non-communicative; CH spoke with Palliative Care and will follow and monitor per their GOC and assessment plan. Erline LevineMichael I Lido Maske 4:08 PM

## 2016-01-30 NOTE — Progress Notes (Signed)
Nutrition Follow-up  DOCUMENTATION CODES:   Obesity unspecified  INTERVENTION:  - Continue TF per MD orders with pt transitioning to hospice/comfort care only  NUTRITION DIAGNOSIS:   Increased nutrient needs related to wound healing as evidenced by estimated needs. -ongoing  GOAL:   Patient will meet greater than or equal to 90% of their needs -unmet with current TF rate for pt transitioning to comfort/hospice  MONITOR:   Labs, I & O's, TF tolerance, Skin, Weight trends  ASSESSMENT:   50 year old female with past medical history of CVA, non verbal, has feeding tube, seizure disorder, recent hospitalization (12/03/2015 - 12/08/2015) for sepsis, UTI due to ESBL, subsequent admission for sepsis and GI bleed. [facility] for fevers. Pt is not able to provide any history. No respiratory distress. No falls. No loss of consciousness.  3/27 Per MD note yesterday (3/26) @ 1200: Son has decided on comfort care but would like tube feeds to continue. - DNR discussed Looking for hospice bed- [place] facility in [place]. Will likely have bed by tomorrow. Life expectancy < 30 days if infection is not treated.  Pt with PEG and now receiving Osmolite 1.5 @ 20 mL/hr with 200 mL free water TID which is providing 720 kcal (36% minimum estimated kcal needs), 30 grams protein (26% minimum estimated protein needs) , and 966 mL free water.  D/c order placed this AM but no d/c summary yet. RN reports TF to run at 20 mL/hr for 24 hours and then be turned off; will monitor regimen if pt unable to d/c. Medications reviewed. Labs reviewed; CBGs: 105-122 mg/dL since 4/093/26 AM, Na: 811134 mmol/L, Ca: 8.4 mg/dL, creatinine low.    3/24 - Received page from MD this AM concerning TF regimen for pt with ongoing diarrhea. - Spoke with RN who reports last BM was this AM around 0900 and that this occurrence was watery in nature.  - RN reports pt currently on Vancomycin and was previously  on Merrem as well. - Pt currently receiving Jevity 1.5 @ 45 mL/hr with 60 mL Prostat BID and 200 mL free water every 8 hours.  - This regimen is providing 2020, 129 grams of protein, and 1421 mL free water which is meeting estimated nutrition needs. Sherron Monday- Spoke with MD about current TF regimen, antibiotic orders, and alternative TF formula. - MD would like to switch pt to Osmolite 1.5 at this time and monitor for any possible benefit this may provide for diarrhea occurences.  - RD to place order for new TF formula per MD request. Alerted RN to change. - Pt to meet needs with change in TF regimen.  - Will monitor tolerance and bowel habits and any additional needs.    Diet Order:  Diet regular Room service appropriate?: Yes; Fluid consistency:: Thin  Skin:   Stage 4 sacral pressure ulcer   Last BM:  3/27  Height:   Ht Readings from Last 1 Encounters:  01/20/16 5\' 5"  (1.651 m)    Weight:   Wt Readings from Last 1 Encounters:  01/30/16 173 lb 11.6 oz (78.8 kg)    Ideal Body Weight:  54.5 kg  BMI:  Body mass index is 28.91 kg/(m^2).  Estimated Nutritional Needs:   Kcal:  2000-2150 (33-35 kcal/kg actual body weight)  Protein:  115-130 grams (2-2.3 grams/kg IBW)  Fluid:  >/= 2L  EDUCATION NEEDS:   No education needs identified at this time     Shanda BumpsJessica  Ceasar Mons, RD, LDN Inpatient Clinical Dietitian Pager # [phone] After hours/weekend pager # [phone]

## 2016-01-31 DIAGNOSIS — A419 Sepsis, unspecified organism: Secondary | ICD-10-CM

## 2016-01-31 NOTE — Progress Notes (Signed)
[facility].  CSW facilitated pt discharge needs including contacting facility, providing discharge information to [facility] liaison, Renea EeKara Marshall, discussing with pt son, Joseph Berkshireorrie, RN to call report, and scheduled transportation for 11 am pick up.   Pt son coping appropriately with transition to residential hospice.  No further social work needs identified at this time.  CSW signing off.   Loletta SpecterSuzanna Kidd, MSW, LCSW Clinical Social Work [phone]

## 2016-01-31 NOTE — Progress Notes (Addendum)
[facility].  Transported by [facility].  Pt stable.  No sign of discomfort.  Tube feedings discontinued this morning.  PEG dressing changed, foley care done, PICC removed by IV team.  Wound vac removed and stage IV pressure ulcer to sacrum dressed with wet to dry dressing.  [facility].

## 2016-01-31 NOTE — Progress Notes (Signed)
[facility]  Mrs Iona BeardHaith was examined today. OK to d/c to hospice home. Please see my d/c summary from 3/25 which has been updated.    Calvert CantorSaima Mischa Brittingham, MD

## 2016-02-16 ENCOUNTER — Telehealth: Payer: Self-pay | Admitting: Family Medicine

## 2016-02-16 NOTE — Telephone Encounter (Signed)
Elnita MaxwellCheryl called to let Dr.Aron know patient passed away on 03-23-2016 at 2:18 am.

## 2016-02-16 NOTE — Telephone Encounter (Signed)
Thank you for letting me know.  Her family is my thoughts.

## 2016-03-05 DEATH — deceased
